# Patient Record
Sex: Male | Born: 1945
Health system: Southern US, Community
[De-identification: ages and names within clinical notes are randomized; demographics above are authoritative.]

## PROBLEM LIST (undated history)

## (undated) ENCOUNTER — Emergency Department: Admission: EM

## (undated) DIAGNOSIS — G5602 Carpal tunnel syndrome, left upper limb: Secondary | ICD-10-CM

## (undated) DIAGNOSIS — I639 Cerebral infarction, unspecified: Secondary | ICD-10-CM

## (undated) DIAGNOSIS — C801 Malignant (primary) neoplasm, unspecified: Secondary | ICD-10-CM

## (undated) DIAGNOSIS — D631 Anemia in chronic kidney disease: Secondary | ICD-10-CM

## (undated) DIAGNOSIS — M5126 Other intervertebral disc displacement, lumbar region: Secondary | ICD-10-CM

## (undated) DIAGNOSIS — I2699 Other pulmonary embolism without acute cor pulmonale: Secondary | ICD-10-CM

## (undated) DIAGNOSIS — N400 Enlarged prostate without lower urinary tract symptoms: Secondary | ICD-10-CM

## (undated) DIAGNOSIS — R001 Bradycardia, unspecified: Secondary | ICD-10-CM

## (undated) DIAGNOSIS — G4730 Sleep apnea, unspecified: Secondary | ICD-10-CM

## (undated) DIAGNOSIS — S83209A Unspecified tear of unspecified meniscus, current injury, unspecified knee, initial encounter: Secondary | ICD-10-CM

## (undated) DIAGNOSIS — Z8546 Personal history of malignant neoplasm of prostate: Secondary | ICD-10-CM

## (undated) DIAGNOSIS — K589 Irritable bowel syndrome without diarrhea: Secondary | ICD-10-CM

## (undated) DIAGNOSIS — M1710 Unilateral primary osteoarthritis, unspecified knee: Secondary | ICD-10-CM

## (undated) DIAGNOSIS — M179 Osteoarthritis of knee, unspecified: Secondary | ICD-10-CM

## (undated) DIAGNOSIS — R918 Other nonspecific abnormal finding of lung field: Secondary | ICD-10-CM

## (undated) DIAGNOSIS — N2889 Other specified disorders of kidney and ureter: Secondary | ICD-10-CM

## (undated) DIAGNOSIS — K5792 Diverticulitis of intestine, part unspecified, without perforation or abscess without bleeding: Secondary | ICD-10-CM

## (undated) DIAGNOSIS — E785 Hyperlipidemia, unspecified: Secondary | ICD-10-CM

## (undated) DIAGNOSIS — N182 Chronic kidney disease, stage 2 (mild): Secondary | ICD-10-CM

## (undated) DIAGNOSIS — N529 Male erectile dysfunction, unspecified: Secondary | ICD-10-CM

## (undated) DIAGNOSIS — I10 Essential (primary) hypertension: Secondary | ICD-10-CM

## (undated) HISTORY — DX: Other nonspecific abnormal finding of lung field: R91.8

## (undated) HISTORY — DX: Essential (primary) hypertension: I10

## (undated) HISTORY — PX: KNEE ARTHROSCOPY: SUR90

## (undated) HISTORY — DX: Malignant (primary) neoplasm, unspecified: C80.1

## (undated) HISTORY — PX: PROSTATECTOMY: SHX69

## (undated) HISTORY — DX: Hyperlipidemia, unspecified: E78.5

## (undated) HISTORY — PX: LUMBAR LAMINECTOMY: SHX95

## (undated) HISTORY — DX: Bradycardia, unspecified: R00.1

## (undated) HISTORY — DX: Male erectile dysfunction, unspecified: N52.9

## (undated) HISTORY — DX: Other specified disorders of kidney and ureter: N28.89

## (undated) HISTORY — DX: Personal history of malignant neoplasm of prostate: Z85.46

## (undated) HISTORY — DX: Unilateral primary osteoarthritis, unspecified knee: M17.10

## (undated) HISTORY — DX: Irritable bowel syndrome, unspecified: K58.9

## (undated) HISTORY — DX: Sleep apnea, unspecified: G47.30

## (undated) HISTORY — DX: Diverticulitis of intestine, part unspecified, without perforation or abscess without bleeding: K57.92

## (undated) HISTORY — DX: Osteoarthritis of knee, unspecified: M17.9

## (undated) HISTORY — DX: Chronic kidney disease, stage 2 (mild): N18.2

## (undated) HISTORY — DX: Carpal tunnel syndrome, left upper limb: G56.02

## (undated) HISTORY — DX: Unspecified tear of unspecified meniscus, current injury, unspecified knee, initial encounter: S83.209A

## (undated) HISTORY — DX: Other intervertebral disc displacement, lumbar region: M51.26

## (undated) HISTORY — DX: Benign prostatic hyperplasia without lower urinary tract symptoms: N40.0

## (undated) HISTORY — PX: COLONOSCOPY: SHX174

---

## 2008-09-13 ENCOUNTER — Ambulatory Visit: Payer: Self-pay | Admitting: General Practice

## 2009-02-28 ENCOUNTER — Ambulatory Visit: Payer: Self-pay | Admitting: General Practice

## 2009-11-14 ENCOUNTER — Ambulatory Visit: Payer: Self-pay | Admitting: General Practice

## 2011-01-01 ENCOUNTER — Ambulatory Visit: Payer: Self-pay | Admitting: General Practice

## 2012-03-13 ENCOUNTER — Emergency Department: Payer: Self-pay | Admitting: Emergency Medicine

## 2012-05-28 ENCOUNTER — Ambulatory Visit: Payer: Self-pay | Admitting: Family Medicine

## 2014-05-19 HISTORY — PX: OTHER SURGICAL HISTORY: SHX169

## 2014-05-27 ENCOUNTER — Ambulatory Visit: Payer: Self-pay | Admitting: Family Medicine

## 2014-08-25 ENCOUNTER — Emergency Department: Payer: Self-pay | Admitting: Emergency Medicine

## 2014-08-25 LAB — URINALYSIS, COMPLETE
BACTERIA: NONE SEEN
BILIRUBIN, UR: NEGATIVE
BLOOD: NEGATIVE
GLUCOSE, UR: NEGATIVE mg/dL (ref 0–75)
Ketone: NEGATIVE
Leukocyte Esterase: NEGATIVE
Nitrite: NEGATIVE
Ph: 6 (ref 4.5–8.0)
Protein: NEGATIVE
RBC, UR: NONE SEEN /HPF (ref 0–5)
Specific Gravity: 1.017 (ref 1.003–1.030)

## 2014-08-25 LAB — COMPREHENSIVE METABOLIC PANEL
ANION GAP: 6 — AB (ref 7–16)
Albumin: 4.1 g/dL (ref 3.4–5.0)
Alkaline Phosphatase: 73 U/L
BUN: 13 mg/dL (ref 7–18)
Bilirubin,Total: 0.3 mg/dL (ref 0.2–1.0)
CHLORIDE: 100 mmol/L (ref 98–107)
CREATININE: 1.3 mg/dL (ref 0.60–1.30)
Calcium, Total: 9.1 mg/dL (ref 8.5–10.1)
Co2: 27 mmol/L (ref 21–32)
EGFR (Non-African Amer.): 58 — ABNORMAL LOW
GLUCOSE: 95 mg/dL (ref 65–99)
Osmolality: 266 (ref 275–301)
POTASSIUM: 4.1 mmol/L (ref 3.5–5.1)
SGOT(AST): 19 U/L (ref 15–37)
SGPT (ALT): 20 U/L
SODIUM: 133 mmol/L — AB (ref 136–145)
Total Protein: 7.7 g/dL (ref 6.4–8.2)

## 2014-08-25 LAB — CBC WITH DIFFERENTIAL/PLATELET
Basophil #: 0.1 10*3/uL (ref 0.0–0.1)
Basophil %: 1.3 %
Eosinophil #: 0.1 10*3/uL (ref 0.0–0.7)
Eosinophil %: 2.5 %
HCT: 42.5 % (ref 40.0–52.0)
HGB: 13.8 g/dL (ref 13.0–18.0)
LYMPHS PCT: 42.1 %
Lymphocyte #: 2.2 10*3/uL (ref 1.0–3.6)
MCH: 27.6 pg (ref 26.0–34.0)
MCHC: 32.5 g/dL (ref 32.0–36.0)
MCV: 85 fL (ref 80–100)
MONOS PCT: 9.2 %
Monocyte #: 0.5 x10 3/mm (ref 0.2–1.0)
NEUTROS ABS: 2.3 10*3/uL (ref 1.4–6.5)
Neutrophil %: 44.9 %
PLATELETS: 284 10*3/uL (ref 150–440)
RBC: 4.99 10*6/uL (ref 4.40–5.90)
RDW: 13.1 % (ref 11.5–14.5)
WBC: 5.2 10*3/uL (ref 3.8–10.6)

## 2014-08-25 LAB — LIPASE, BLOOD: Lipase: 87 U/L (ref 73–393)

## 2015-08-20 DIAGNOSIS — K2970 Gastritis, unspecified, without bleeding: Secondary | ICD-10-CM

## 2015-08-20 HISTORY — DX: Gastritis, unspecified, without bleeding: K29.70

## 2015-10-17 DIAGNOSIS — N182 Chronic kidney disease, stage 2 (mild): Secondary | ICD-10-CM | POA: Insufficient documentation

## 2015-10-17 DIAGNOSIS — E785 Hyperlipidemia, unspecified: Secondary | ICD-10-CM | POA: Insufficient documentation

## 2015-10-17 DIAGNOSIS — N1831 Chronic kidney disease, stage 3a: Secondary | ICD-10-CM | POA: Insufficient documentation

## 2015-10-17 DIAGNOSIS — Z8546 Personal history of malignant neoplasm of prostate: Secondary | ICD-10-CM | POA: Insufficient documentation

## 2015-10-17 DIAGNOSIS — R918 Other nonspecific abnormal finding of lung field: Secondary | ICD-10-CM | POA: Insufficient documentation

## 2015-10-17 DIAGNOSIS — I10 Essential (primary) hypertension: Secondary | ICD-10-CM | POA: Insufficient documentation

## 2015-10-17 DIAGNOSIS — N183 Chronic kidney disease, stage 3 unspecified: Secondary | ICD-10-CM | POA: Insufficient documentation

## 2015-10-17 DIAGNOSIS — G5602 Carpal tunnel syndrome, left upper limb: Secondary | ICD-10-CM | POA: Insufficient documentation

## 2015-10-19 ENCOUNTER — Ambulatory Visit (INDEPENDENT_AMBULATORY_CARE_PROVIDER_SITE_OTHER): Payer: Medicare Other | Admitting: Family Medicine

## 2015-10-19 ENCOUNTER — Encounter: Payer: Self-pay | Admitting: Family Medicine

## 2015-10-19 VITALS — BP 149/68 | HR 79 | Temp 97.2°F | Ht 71.5 in | Wt 229.0 lb

## 2015-10-19 DIAGNOSIS — N2889 Other specified disorders of kidney and ureter: Secondary | ICD-10-CM

## 2015-10-19 DIAGNOSIS — G5602 Carpal tunnel syndrome, left upper limb: Secondary | ICD-10-CM | POA: Diagnosis not present

## 2015-10-19 DIAGNOSIS — I700 Atherosclerosis of aorta: Secondary | ICD-10-CM

## 2015-10-19 DIAGNOSIS — I10 Essential (primary) hypertension: Secondary | ICD-10-CM

## 2015-10-19 DIAGNOSIS — E785 Hyperlipidemia, unspecified: Secondary | ICD-10-CM

## 2015-10-19 DIAGNOSIS — N182 Chronic kidney disease, stage 2 (mild): Secondary | ICD-10-CM | POA: Diagnosis not present

## 2015-10-19 DIAGNOSIS — R918 Other nonspecific abnormal finding of lung field: Secondary | ICD-10-CM | POA: Diagnosis not present

## 2015-10-19 DIAGNOSIS — Z8546 Personal history of malignant neoplasm of prostate: Secondary | ICD-10-CM

## 2015-10-19 DIAGNOSIS — R1084 Generalized abdominal pain: Secondary | ICD-10-CM | POA: Diagnosis not present

## 2015-10-19 DIAGNOSIS — N281 Cyst of kidney, acquired: Secondary | ICD-10-CM | POA: Insufficient documentation

## 2015-10-19 LAB — UA/M W/RFLX CULTURE, ROUTINE
BILIRUBIN UA: NEGATIVE
GLUCOSE, UA: NEGATIVE
Ketones, UA: NEGATIVE
Leukocytes, UA: NEGATIVE
Nitrite, UA: NEGATIVE
PROTEIN UA: NEGATIVE
RBC UA: NEGATIVE
SPEC GRAV UA: 1.02 (ref 1.005–1.030)
Urobilinogen, Ur: 0.2 mg/dL (ref 0.2–1.0)
pH, UA: 6.5 (ref 5.0–7.5)

## 2015-10-19 MED ORDER — DICYCLOMINE HCL 10 MG PO CAPS: 10.0000 mg | ORAL_CAPSULE | Freq: Three times a day (TID) | ORAL | Status: DC

## 2015-10-19 NOTE — Progress Notes (Signed)
BP 149/68 mmHg  Pulse 79  Temp(Src) 97.2 F (36.2 C)  Ht 5' 11.5" (1.816 m)  Wt 229 lb (103.874 kg)  BMI 31.50 kg/m2  SpO2 100%   Subjective:    Patient ID: Jared Cline, male    DOB: 07-Jul-1946, 70 y.o.   MRN: [id number]  HPI: Jared Cline is a 70 y.o. male  Chief Complaint  Patient presents with  . Irritable Bowel Syndrome    he went to the ER about a year ago with same symptoms, was told he had IBS. It is flaring up again.   He is having IBS; same as last year; this round started up 3 weeks ago; whatever he eats, cramps; it can be anything; just comes on once a year; started back in the 1990's He went to the ER for evaluation and they told him it was IBS back in Jan 2016; notes reviewed Having pain again Constipated the day he went to the ER last year Aortic atherosclerosis noted on scan; was put on statin and had memory loss with the statin (either Lipitor or Crestor), did not tolerate it  Reviewed CT scan from Aug 25, 2014 in detail with patient here in the office: * PRIOR REPORT IMPORTED FROM AN EXTERNAL SYSTEM *  CLINICAL DATA: Complains of "severe abdominal pain" since Friday.  Pain bilateral to lower quadrants, constant and sharp in nature.  Denies n/v/d. Denies urinary symptoms. Lower back pain. HX: prostate  surgery due to CA.   EXAM:  CT ABDOMEN AND PELVIS WITH CONTRAST   TECHNIQUE:  Multidetector CT imaging of the abdomen and pelvis was performed  using the standard protocol following bolus administration of  intravenous contrast.   CONTRAST: 100 ml Omnipaque 300   COMPARISON: 01/01/2011   FINDINGS:  There is a 5 mm left lower lobe pulmonary nodule. There are two 5 mm  lingular pulmonary nodules.   The liver demonstrates no focal abnormality. There is no  intrahepatic or extrahepatic biliary ductal dilatation. The  gallbladder is normal. The spleen demonstrates no focal abnormality.  There is a 9 mm right renal mass which is  similar in size to the  prior exam of 01/01/2011. There are bilateral renal lobulation is.  There is right renal scarring. The adrenal glands and pancreas are  normal. The bladder is unremarkable. There is evidence of prior  prostatectomy.   The stomach, duodenum, small intestine, and large intestine  demonstrate no contrast extravasation or dilatation. There is a  normal caliber appendix in the right lower quadrant without  periappendiceal inflammatory changes.There is a moderate amount of  stool in the ascending and transverse colon. There is no  pneumoperitoneum, pneumatosis, or portal venous gas. There is no  abdominal or pelvic free fluid. There is no lymphadenopathy.   The abdominal aorta is normal in caliber with atherosclerosis.   There are no lytic or sclerotic osseous lesions. There is  degenerative disc disease at L3-4. There is bilateral facet  arthropathy at L3-4, L4-5 and L5-S1. There is mild osteoarthritis of  bilateral hips. There are degenerative changes of bilateral SI  joints.   IMPRESSION:  1. No acute abdominal or pelvic pathology.  2. Nonspecific pulmonary nodules. There is a 5 mm left lower lobe  pulmonary nodule. There are two 5 mm lingular pulmonary nodules.  3. Moderate amount of stool in the ascending and transverse colon.  4. There is a 9 mm right renal mass which is similar in size to the  prior exam  of 01/01/2011. This may reflect a small renal cell  carcinoma versus complex cyst, but is incompletely characterized on  this exam. Urology follow-up recommended.    Electronically Signed   By: Kathreen Devoid   On: 08/25/2014 12:42   Relevant past medical, surgical, family and social history reviewed and updated as indicated Past Medical History  Diagnosis Date  . Hyperlipidemia   . Hypertension   . CKD (chronic kidney disease) stage 2, GFR 60-89 ml/min   . Carpal tunnel syndrome, left     left arm  .  Bradycardia     evaluated by Dr. Nehemiah Massed  . History of prostate cancer   . BPH (benign prostatic hypertrophy)   . Impotence   . GERD (gastroesophageal reflux disease)   . OA (osteoarthritis) of knee     right  . Torn meniscus     x 2; right knee  . HNP (herniated nucleus pulposus), lumbar   . Diverticulitis   . Renal mass     evaluated; followed by urologist  . Multiple lung nodules     nonspecific, largest 75mm in Jan 2016; rescan July 2016/Jan 2017   Past Surgical History  Procedure Laterality Date  . Knee arthroscopy Right     x 2  . Prostatectomy      robotic  . Lumbar laminectomy    . Abdominal ultrasound  Oct 2015    Negative for eval for AAA   Family History  Problem Relation Age of Onset  . Stroke Mother   . Hypertension Mother   . Aneurysm Father     cerebral  . Hypertension Father   . Heart disease Paternal Uncle   . Cancer Neg Hx   . COPD Neg Hx   . Diabetes Neg Hx   paternal uncles, father died from heart attacks / strokes  Social History  Substance Use Topics  . Smoking status: Former Smoker -- 0.50 packs/day for 10 years    Types: Cigarettes    Quit date: 08/20/1971  . Smokeless tobacco: Never Used  . Alcohol Use: No   Started smoking at age 19-18, stopped at 66 Interim medical history since our last visit reviewed. Allergies and medications reviewed and updated.  Review of Systems No night sweats; no unexplained; no blood in the urine; no blood in the stool  Per HPI unless specifically indicated above     Objective:    BP 149/68 mmHg  Pulse 79  Temp(Src) 97.2 F (36.2 C)  Ht 5' 11.5" (1.816 m)  Wt 229 lb (103.874 kg)  BMI 31.50 kg/m2  SpO2 100%  Wt Readings from Last 3 Encounters:  10/19/15 229 lb (103.874 kg)  11/25/14 229 lb (103.874 kg)    Physical Exam  Constitutional: He appears well-developed and well-nourished. No distress.  Weight stable  HENT:  Head: Normocephalic and atraumatic.  Eyes: EOM are normal. No scleral  icterus.  Neck: No thyromegaly present.  Cardiovascular: Normal rate and regular rhythm.   Pulmonary/Chest: Effort normal and breath sounds normal.  Abdominal: Soft. Normal appearance and bowel sounds are normal. He exhibits no distension. There is no hepatosplenomegaly. There is no tenderness. No hernia.  Musculoskeletal: He exhibits no edema.  Neurological: Coordination normal.  Skin: Skin is warm and dry. No pallor.  Psychiatric: He has a normal mood and affect. His behavior is normal. Judgment and thought content normal.      Assessment & Plan:   Problem List Items Addressed This Visit  Cardiovascular and Mediastinum   Hypertension    Controlled today      Aortic atherosclerosis ([place])    Noted incidentally on abdominal CT scan last year done in the ER; reviewed with patient in detail today; will check lipid panel; he did not tolerate statin        Nervous and Auditory   Carpal tunnel syndrome, left    Stable, uses brace        Genitourinary   CKD (chronic kidney disease) stage 2, GFR 60-89 ml/min    Check renal function        Other   Hyperlipidemia    Check today      Relevant Orders   Lipid Panel w/o Chol/HDL Ratio (Completed)   History of prostate cancer    Send back to Dr. Yves Dill; check PSA today      Relevant Orders   PSA (Completed)   Ambulatory referral to Urology   Multiple lung nodules    Order lung scan today      Relevant Orders   CT Chest W Contrast   Right renal mass    Refer to urologist; urine today      Relevant Orders   CT Abdomen Pelvis W Contrast   Ambulatory referral to Urology    Other Visit Diagnoses    Abdominal pain, generalized    -  Primary    Relevant Orders    CBC with Differential/Platelet (Completed)    Comprehensive metabolic panel (Completed)    UA/M w/rflx Culture, Routine (Completed)    Ambulatory referral to Gastroenterology       Follow up plan: No Follow-up on file.  Will depend on studies,  specialist evaluations  An after-visit summary was printed and given to the patient at [place].  Please see the patient instructions which may contain other information and recommendations beyond what is mentioned above in the assessment and plan.  Orders Placed This Encounter  Procedures  . CT Abdomen Pelvis W Contrast  . CT Chest W Contrast  . Lipid Panel w/o Chol/HDL Ratio  . CBC with Differential/Platelet  . Comprehensive metabolic panel  . UA/M w/rflx Culture, Routine  . PSA  . Ambulatory referral to Urology  . Ambulatory referral to Gastroenterology   Meds ordered this encounter  Medications  . Potassium Gluconate 550 MG TABS    Sig: Take 550 mg by mouth daily.  [name] dicyclomine (BENTYL) 10 MG capsule    Sig: Take 1 capsule (10 mg total) by mouth 4 (four) times daily -  before meals and at bedtime. As needed for irritable bowel symptoms    Dispense:  40 capsule    Refill:  0

## 2015-10-19 NOTE — Assessment & Plan Note (Addendum)
Refer to urologist; urine today

## 2015-10-19 NOTE — Assessment & Plan Note (Signed)
Check renal function. 

## 2015-10-19 NOTE — Assessment & Plan Note (Signed)
Check today 

## 2015-10-19 NOTE — Assessment & Plan Note (Signed)
Controlled today 

## 2015-10-19 NOTE — Patient Instructions (Addendum)
If you have not heard anything from my staff by Monday afternoon about any orders/referrals/studies from today, please contact us here to follow-up [phone] If your pain worsens or you have worrisome signs like blood in your urine or stool, please go to urgent care or the ER over the weekend Try the low FODMAP diet Start the [place] -- I am fine to accept patient's wife as a patient, so okay to put her on my schedule as a new patient

## 2015-10-19 NOTE — Assessment & Plan Note (Signed)
Order lung scan today

## 2015-10-19 NOTE — Assessment & Plan Note (Signed)
Stable, uses brace

## 2015-10-19 NOTE — Assessment & Plan Note (Signed)
Send back to Dr. Yves Dill; check PSA today

## 2015-10-20 ENCOUNTER — Telehealth: Payer: Self-pay | Admitting: Family Medicine

## 2015-10-20 LAB — COMPREHENSIVE METABOLIC PANEL
ALBUMIN: 4.3 g/dL (ref 3.5–4.8)
ALK PHOS: 75 IU/L (ref 39–117)
ALT: 11 IU/L (ref 0–44)
AST: 17 IU/L (ref 0–40)
Albumin/Globulin Ratio: 1.7 (ref 1.1–2.5)
BUN / CREAT RATIO: 11 (ref 10–22)
BUN: 12 mg/dL (ref 8–27)
Bilirubin Total: <0.2 mg/dL (ref 0.0–1.2)
CO2: 25 mmol/L (ref 18–29)
Calcium: 9.4 mg/dL (ref 8.6–10.2)
Chloride: 98 mmol/L (ref 96–106)
Creatinine, Ser: 1.1 mg/dL (ref 0.76–1.27)
GFR, EST AFRICAN AMERICAN: 78 mL/min/{1.73_m2} (ref >59)
GFR, EST NON AFRICAN AMERICAN: 67 mL/min/{1.73_m2} (ref >59)
GLOBULIN, TOTAL: 2.6 g/dL (ref 1.5–4.5)
Glucose: 118 mg/dL — ABNORMAL HIGH (ref 65–99)
Potassium: 4.2 mmol/L (ref 3.5–5.2)
SODIUM: 135 mmol/L (ref 134–144)
TOTAL PROTEIN: 6.9 g/dL (ref 6.0–8.5)

## 2015-10-20 LAB — CBC WITH DIFFERENTIAL/PLATELET
Basophils Absolute: 0.1 10*3/uL (ref 0.0–0.2)
Basos: 1 %
EOS (ABSOLUTE): 0.1 10*3/uL (ref 0.0–0.4)
EOS: 2 %
HEMATOCRIT: 37.6 % (ref 37.5–51.0)
HEMOGLOBIN: 12.3 g/dL — AB (ref 12.6–17.7)
IMMATURE GRANS (ABS): 0 10*3/uL (ref 0.0–0.1)
Immature Granulocytes: 0 %
LYMPHS ABS: 2.3 10*3/uL (ref 0.7–3.1)
LYMPHS: 48 %
MCH: 27 pg (ref 26.6–33.0)
MCHC: 32.7 g/dL (ref 31.5–35.7)
MCV: 83 fL (ref 79–97)
MONOCYTES: 10 %
Monocytes Absolute: 0.5 10*3/uL (ref 0.1–0.9)
NEUTROS ABS: 1.9 10*3/uL (ref 1.4–7.0)
Neutrophils: 39 %
Platelets: 343 10*3/uL (ref 150–379)
RBC: 4.55 x10E6/uL (ref 4.14–5.80)
RDW: 13.1 % (ref 12.3–15.4)
WBC: 4.9 10*3/uL (ref 3.4–10.8)

## 2015-10-20 LAB — LIPID PANEL W/O CHOL/HDL RATIO
CHOLESTEROL TOTAL: 174 mg/dL (ref 100–199)
HDL: 48 mg/dL (ref >39)
LDL Calculated: 112 mg/dL — ABNORMAL HIGH (ref 0–99)
TRIGLYCERIDES: 71 mg/dL (ref 0–149)
VLDL Cholesterol Cal: 14 mg/dL (ref 5–40)

## 2015-10-20 LAB — PSA: Prostate Specific Ag, Serum: <0.1 ng/mL (ref 0.0–4.0)

## 2015-10-20 NOTE — Telephone Encounter (Signed)
I talked with patient; PSA undetectable as it should be; Hgb down just 3 tenths of a point; he is going for colonoscopy; had recent GI upset, will see if something there caused some loss of blood; kidneys working a little better now than last year; I'll check LDL with 10 year ASCVD calculator to see if med needed He requested copy of labs be mailed when I offered

## 2015-10-21 MED ORDER — ATORVASTATIN CALCIUM 20 MG PO TABS: 20.0000 mg | ORAL_TABLET | Freq: Every day | ORAL | Status: DC

## 2015-10-21 NOTE — Telephone Encounter (Signed)
Please let patient know that I did the risk calculation for heart disease and a statin is indeed recommended for him; I sent a prescription to the pharmacy and I'd like to have him recheck his cholesterol (fasting) in about 8 weeks Try to limit saturated fats (bacon, sausage, hamburgers, cheese, etc) and try to lose 5-10 pounds over the next few months Please send copy of lab results to patient

## 2015-10-23 ENCOUNTER — Ambulatory Visit: Payer: Self-pay | Admitting: Gastroenterology

## 2015-10-23 NOTE — Telephone Encounter (Signed)
Left message to call.

## 2015-10-23 NOTE — Telephone Encounter (Signed)
Patient notified. Copy of labs and a cholesterol diet handout mailed to patient.

## 2015-10-25 DIAGNOSIS — I70219 Atherosclerosis of native arteries of extremities with intermittent claudication, unspecified extremity: Secondary | ICD-10-CM | POA: Insufficient documentation

## 2015-10-25 DIAGNOSIS — I700 Atherosclerosis of aorta: Secondary | ICD-10-CM | POA: Insufficient documentation

## 2015-10-25 NOTE — Assessment & Plan Note (Signed)
Noted incidentally on abdominal CT scan last year done in the ER; reviewed with patient in detail today; will check lipid panel; he did not tolerate statin

## 2015-10-26 DIAGNOSIS — D400 Neoplasm of uncertain behavior of prostate: Secondary | ICD-10-CM | POA: Diagnosis not present

## 2015-10-26 DIAGNOSIS — N281 Cyst of kidney, acquired: Secondary | ICD-10-CM | POA: Diagnosis not present

## 2015-10-27 ENCOUNTER — Telehealth: Payer: Self-pay

## 2015-10-27 NOTE — Telephone Encounter (Signed)
Nevermind Dr. Sanda Klein. Nursing office from [facility] provider portal from [facility] called me and I got it approved.  Authorization # [id number]

## 2015-10-27 NOTE — Telephone Encounter (Signed)
Patients approval for CT Scan Chest and CT Scan Abdomen/Pelvis needs peer to peer review with [employer].  You can call the directed line for the peer to peer at [phone].

## 2015-11-03 ENCOUNTER — Ambulatory Visit
Admission: RE | Admit: 2015-11-03 | Discharge: 2015-11-03 | Disposition: A | Discharge status: Home / Self Care | Payer: Medicare Other | Source: Ambulatory Visit | Attending: Family Medicine | Admitting: Family Medicine

## 2015-11-03 DIAGNOSIS — N289 Disorder of kidney and ureter, unspecified: Secondary | ICD-10-CM | POA: Diagnosis not present

## 2015-11-03 DIAGNOSIS — R918 Other nonspecific abnormal finding of lung field: Secondary | ICD-10-CM | POA: Diagnosis not present

## 2015-11-03 DIAGNOSIS — N2889 Other specified disorders of kidney and ureter: Secondary | ICD-10-CM | POA: Insufficient documentation

## 2015-11-03 MED ORDER — IOHEXOL 300 MG/ML  SOLN
100.0000 mL | Freq: Once | INTRAMUSCULAR | Status: AC | PRN
  Administered 2015-11-03: 100 mL via INTRAVENOUS

## 2015-11-06 ENCOUNTER — Telehealth: Payer: Self-pay | Admitting: Family Medicine

## 2015-11-06 DIAGNOSIS — R918 Other nonspecific abnormal finding of lung field: Secondary | ICD-10-CM

## 2015-11-06 NOTE — Telephone Encounter (Signed)
Patient notified of results. He saw [facility] (urology) last week. I'll fax a copy of results to his office.

## 2015-11-06 NOTE — Telephone Encounter (Signed)
Please let the patient know I am out today; my apologies; he has a growth on his right kidney, so we'll have him see the urologist; please check on the referral from March 2nd because we'll want him to see kidney doctor as soon as possible The nodules in his chest appear stable, but we'll want to follow that with another CT scan in 3-6 months; there are no enlargements of any lymph nodes, so that's good news

## 2015-11-06 NOTE — Telephone Encounter (Signed)
Left message to call.

## 2015-11-21 ENCOUNTER — Telehealth: Payer: Self-pay

## 2015-11-21 DIAGNOSIS — Z9989 Dependence on other enabling machines and devices: Secondary | ICD-10-CM

## 2015-11-21 DIAGNOSIS — G4733 Obstructive sleep apnea (adult) (pediatric): Secondary | ICD-10-CM

## 2015-11-21 MED ORDER — HYDROCHLOROTHIAZIDE 25 MG PO TABS: 25.0000 mg | ORAL_TABLET | Freq: Every day | ORAL | Status: DC

## 2015-11-21 MED ORDER — CLONIDINE HCL 0.3 MG PO TABS: 0.3000 mg | ORAL_TABLET | Freq: Two times a day (BID) | ORAL | Status: DC

## 2015-11-21 MED ORDER — HYDRALAZINE HCL 25 MG PO TABS: 25.0000 mg | ORAL_TABLET | Freq: Two times a day (BID) | ORAL | Status: DC

## 2015-11-21 MED ORDER — ATORVASTATIN CALCIUM 20 MG PO TABS: 20.0000 mg | ORAL_TABLET | Freq: Every day | ORAL | Status: DC

## 2015-11-21 MED ORDER — DICYCLOMINE HCL 10 MG PO CAPS: 10.0000 mg | ORAL_CAPSULE | Freq: Three times a day (TID) | ORAL | Status: DC

## 2015-11-21 NOTE — Telephone Encounter (Signed)
I sent in the Rxs as requested We just need to know where to send the CPAP order, and if you can find the settings that would be helpful; verify if we do his CPAP or if maybe pulmonologist or ENT, so we don't duplicate work; thanks
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
## 2015-11-22 DIAGNOSIS — Z9989 Dependence on other enabling machines and devices: Secondary | ICD-10-CM | POA: Insufficient documentation

## 2015-11-22 DIAGNOSIS — G4733 Obstructive sleep apnea (adult) (pediatric): Secondary | ICD-10-CM | POA: Insufficient documentation

## 2015-11-22 NOTE — Telephone Encounter (Signed)
Address: [address], [place], [place] [address]  Phone: [phone] I called [facility] to get settings and ask them to send me a form to sign

## 2015-11-22 NOTE — Telephone Encounter (Signed)
Patient states he cannot remember it's been so long, but uses [facility] and pressure was set at 1?

## 2015-11-22 NOTE — Telephone Encounter (Signed)
I called, explained he will need everything They have done his supplies I spoke with Sonia Baller; she will contact the patient; get a loaner out and work with insurance to re-order new equipment

## 2015-11-23 DIAGNOSIS — G4733 Obstructive sleep apnea (adult) (pediatric): Secondary | ICD-10-CM | POA: Diagnosis not present

## 2015-12-15 ENCOUNTER — Encounter: Payer: Self-pay | Admitting: Gastroenterology

## 2015-12-15 ENCOUNTER — Ambulatory Visit (INDEPENDENT_AMBULATORY_CARE_PROVIDER_SITE_OTHER): Payer: Medicare Other | Admitting: Gastroenterology

## 2015-12-15 VITALS — BP 150/80 | HR 72 | Ht 72.0 in | Wt 222.2 lb

## 2015-12-15 DIAGNOSIS — R1033 Periumbilical pain: Secondary | ICD-10-CM | POA: Diagnosis not present

## 2015-12-15 MED ORDER — NA SULFATE-K SULFATE-MG SULF 17.5-3.13-1.6 GM/177ML PO SOLN: 1.0000 | Freq: Once | ORAL | Status: DC

## 2015-12-15 NOTE — Patient Instructions (Signed)
You will be set up for a colonoscopy. You will be set up for an upper endoscopy. Both for abdominal pains

## 2015-12-15 NOTE — Progress Notes (Signed)
HPI: This is a   very pleasant 70 year old man    who was referred to me by Arnetha Courser, MD  to evaluate  abdominal pain .    Chief complaint is abdominal pain  Abdominal pains, since January.  The pain is located periumbilically.  It is a Twisting sensation, also burning. The pain is daily, intermittent.  Can last 1 hour.  Eating often causes it.   Yogurt helps it.  BMs regular, no bleeding.  NO problems.  He had a colonoscopy MANY years ago.  Never EGD.  Overall his weight down 8-10 pounds, intentionally.  Takes tylenol daily for left shoulder pain.  Years ago, same type of pains, was told it was IBS.   Labs 10/2015: cbc, cmet normal.  CT scan 10/2015:  1. No acute abdominal or pelvic pathology.  2. Nonspecific pulmonary nodules. There is a 5 mm left lower lobe  pulmonary nodule. There are two 5 mm lingular pulmonary nodules.  3. Moderate amount of stool in the ascending and transverse colon.  4. There is a 9 mm right renal mass which is similar in size to the  prior exam of 01/01/2011. This may reflect a small renal cell  carcinoma versus complex cyst, but is incompletely characterized on  this exam. Urology follow-up recommended.   Review of systems: Pertinent positive and negative review of systems were noted in the above HPI section. Complete review of systems was performed and was otherwise normal.   Past Medical History  Diagnosis Date  . Hyperlipidemia   . Hypertension   . CKD (chronic kidney disease) stage 2, GFR 60-89 ml/min   . Carpal tunnel syndrome, left     left arm  . Bradycardia     evaluated by Dr. Nehemiah Massed  . History of prostate cancer   . BPH (benign prostatic hypertrophy)   . Impotence   . GERD (gastroesophageal reflux disease)   . OA (osteoarthritis) of knee     right  . Torn meniscus     x 2; right knee  . HNP (herniated nucleus pulposus), lumbar   . Diverticulitis   . Renal mass     evaluated; followed by urologist  .  Multiple lung nodules     nonspecific, largest 28mm in Jan 2016; rescan July 2016/Jan 2017  . IBS (irritable bowel syndrome)   . Sleep apnea     on CPAP    Past Surgical History  Procedure Laterality Date  . Knee arthroscopy Right     x 2  . Prostatectomy      robotic  . Lumbar laminectomy    . Abdominal ultrasound  Oct 2015    Negative for eval for AAA  . Colonoscopy      Current Outpatient Prescriptions  Medication Sig Dispense Refill  . aspirin EC 81 MG tablet Take 81 mg by mouth daily.    . cloNIDine (CATAPRES) 0.3 MG tablet Take 1 tablet (0.3 mg total) by mouth 2 (two) times daily. 60 tablet 5  . dicyclomine (BENTYL) 10 MG capsule Take 1 capsule (10 mg total) by mouth 4 (four) times daily -  before meals and at bedtime. As needed for irritable bowel symptoms 40 capsule 1  . hydrALAZINE (APRESOLINE) 25 MG tablet Take 1 tablet (25 mg total) by mouth 2 (two) times daily. 60 tablet 5  . hydrochlorothiazide (HYDRODIURIL) 25 MG tablet Take 1 tablet (25 mg total) by mouth daily. 30 tablet 2  . magnesium oxide (MAG-OX) 400 MG  tablet Take 400 mg by mouth daily.    . Potassium Gluconate 550 MG TABS Take 550 mg by mouth daily.     No current facility-administered medications for this visit.    Allergies as of 12/15/2015 - Review Complete 12/15/2015  Allergen Reaction Noted  . Flomax [tamsulosin hcl] Shortness Of Breath 10/17/2015  . Ace inhibitors Other (See Comments) 10/17/2015  . Amlodipine Other (See Comments) 10/17/2015  . Doxazosin  10/17/2015  . Lipitor [atorvastatin] Other (See Comments) 12/15/2015    Family History  Problem Relation Age of Onset  . Stroke Mother   . Hypertension Mother   . Aneurysm Father     cerebral  . Hypertension Father   . Heart disease Paternal Uncle   . Cancer Neg Hx   . COPD Neg Hx   . Diabetes Neg Hx     Social History   Social History  . Marital Status: Married    Spouse Name: N/A  . Number of Children: 3  . Years of Education:  N/A   Occupational History  . retired city of [place] History Main Topics  . Smoking status: Former Smoker -- 0.50 packs/day for 10 years    Types: Cigarettes    Quit date: 08/20/1971  . Smokeless tobacco: Never Used  . Alcohol Use: No  . Drug Use: No  . Sexual Activity: Not on file   Other Topics Concern  . Not on file   Social History Narrative     Physical Exam: BP 150/80 mmHg  Pulse 72  Ht 6' (1.829 m)  Wt 222 lb 3.2 oz (100.789 kg)  BMI 30.13 kg/m2 Constitutional: generally well-appearing Psychiatric: alert and oriented x3 Eyes: extraocular movements intact Mouth: oral pharynx moist, no lesions Neck: supple no lymphadenopathy Cardiovascular: heart regular rate and rhythm Lungs: clear to auscultation bilaterally Abdomen: soft, nontender, nondistended, no obvious ascites, no peritoneal signs, normal bowel sounds Extremities: no lower extremity edema bilaterally Skin: no lesions on visible extremities   Assessment and plan: 70 y.o. male with  Periumbilical pain, worse with eating  CT scan basic lab workup has failed to show etiology of his pains. I recommended we proceed with upper endoscopy and also colonoscopy for further evaluation.   Owens Loffler, MD [facility] 12/15/2015, 11:22 AM  Cc: Arnetha Courser, MD

## 2015-12-18 ENCOUNTER — Encounter: Payer: Self-pay | Admitting: Gastroenterology

## 2015-12-22 ENCOUNTER — Encounter: Payer: Self-pay | Admitting: Gastroenterology

## 2015-12-22 ENCOUNTER — Ambulatory Visit (AMBULATORY_SURGERY_CENTER): Payer: Medicare Other | Admitting: Gastroenterology

## 2015-12-22 VITALS — BP 128/66 | HR 69 | Temp 97.8°F | Resp 13 | Ht 72.0 in | Wt 222.0 lb

## 2015-12-22 DIAGNOSIS — G4733 Obstructive sleep apnea (adult) (pediatric): Secondary | ICD-10-CM | POA: Diagnosis not present

## 2015-12-22 DIAGNOSIS — K2990 Gastroduodenitis, unspecified, without bleeding: Secondary | ICD-10-CM | POA: Diagnosis not present

## 2015-12-22 DIAGNOSIS — R109 Unspecified abdominal pain: Secondary | ICD-10-CM | POA: Diagnosis not present

## 2015-12-22 DIAGNOSIS — R1033 Periumbilical pain: Secondary | ICD-10-CM | POA: Diagnosis present

## 2015-12-22 DIAGNOSIS — K2970 Gastritis, unspecified, without bleeding: Secondary | ICD-10-CM | POA: Diagnosis not present

## 2015-12-22 DIAGNOSIS — B9681 Helicobacter pylori [H. pylori] as the cause of diseases classified elsewhere: Secondary | ICD-10-CM | POA: Diagnosis not present

## 2015-12-22 DIAGNOSIS — K2950 Unspecified chronic gastritis without bleeding: Secondary | ICD-10-CM | POA: Diagnosis not present

## 2015-12-22 MED ORDER — SODIUM CHLORIDE 0.9 % IV SOLN: 500.0000 mL | INTRAVENOUS | Status: DC

## 2015-12-22 NOTE — Progress Notes (Signed)
Called to room to assist during endoscopic procedure.  Patient ID and intended procedure confirmed with present staff. Received instructions for my participation in the procedure from the performing physician.  

## 2015-12-22 NOTE — Op Note (Signed)
[place] Patient Name: Kate Stoneburner Procedure Date: 12/22/2015 3:15 PM MRN: [id number] Endoscopist: Milus Banister , MD Age: 70 Date of Birth: Jan 16, 1946 Gender: Male Procedure:                Colonoscopy Indications:              Periumbilical abdominal pain Medicines:                Monitored Anesthesia Care Procedure:                Pre-Anesthesia Assessment:                           - Prior to the procedure, a History and Physical                            was performed, and patient medications and                            allergies were reviewed. The patient's tolerance of                            previous anesthesia was also reviewed. The risks                            and benefits of the procedure and the sedation                            options and risks were discussed with the patient.                            All questions were answered, and informed consent                            was obtained. Prior Anticoagulants: The patient has                            taken no previous anticoagulant or antiplatelet                            agents. ASA Grade Assessment: II - A patient with                            mild systemic disease. After reviewing the risks                            and benefits, the patient was deemed in                            satisfactory condition to undergo the procedure.                           After obtaining informed consent, the colonoscope  was passed under direct vision. Throughout the                            procedure, the patient's blood pressure, pulse, and                            oxygen saturations were monitored continuously. The                            Model CF-HQ190L [id number] scope was introduced                            through the anus and advanced to the the cecum,                            identified by appendiceal orifice and ileocecal                             valve. The colonoscopy was performed without                            difficulty. The patient tolerated the procedure                            well. The quality of the bowel preparation was                            good. The ileocecal valve, appendiceal orifice, and                            rectum were photographed. Scope In: 3:21:31 PM Scope Out: 3:34:18 PM Scope Withdrawal Time: 0 hours 9 minutes 19 seconds  Total Procedure Duration: 0 hours 12 minutes 47 seconds  Findings:                 A diffuse area of moderate melanosis was found in                            the entire colon.                           The exam was otherwise without abnormality on                            direct and retroflexion views. Complications:            No immediate complications. Estimated blood loss:                            None. Estimated Blood Loss:     Estimated blood loss: none. Impression:               - Melanosis in the colon. This is a staining of the  colon from laxatives.                           - The examination was otherwise normal on direct                            and retroflexion views.                           - No specimens collected. Recommendation:           - Patient has a contact number available for                            emergencies. The signs and symptoms of potential                            delayed complications were discussed with the                            patient. Return to normal activities tomorrow.                            Written discharge instructions were provided to the                            patient.                           - Resume previous diet.                           - Continue present medications.                           - Repeat colonoscopy in 10 years for screening                            purposes. Milus Banister, MD 12/22/2015 3:43:06 PM This report has been signed electronically.

## 2015-12-22 NOTE — Progress Notes (Signed)
A/ox3 pleased with MAC, report to Penny RN 

## 2015-12-22 NOTE — Patient Instructions (Signed)

## 2015-12-22 NOTE — Op Note (Signed)
[facility] Patient Name: Jared Cline Procedure Date: 12/22/2015 3:35 PM MRN: [id number] Endoscopist: Milus Banister , MD Age: 70 Date of Birth: 08-Aug-1946 Gender: Male Procedure:                Upper GI endoscopy Indications:              Periumbilical abdominal pain Medicines:                Monitored Anesthesia Care Procedure:                Pre-Anesthesia Assessment:                           - Prior to the procedure, a History and Physical                            was performed, and patient medications and                            allergies were reviewed. The patient's tolerance of                            previous anesthesia was also reviewed. The risks                            and benefits of the procedure and the sedation                            options and risks were discussed with the patient.                            All questions were answered, and informed consent                            was obtained. Prior Anticoagulants: The patient has                            taken no previous anticoagulant or antiplatelet                            agents. ASA Grade Assessment: II - A patient with                            mild systemic disease. After reviewing the risks                            and benefits, the patient was deemed in                            satisfactory condition to undergo the procedure.                           - Prior to the procedure, a History and Physical  was performed, and patient medications and                            allergies were reviewed. The patient's tolerance of                            previous anesthesia was also reviewed. The risks                            and benefits of the procedure and the sedation                            options and risks were discussed with the patient.                            All questions were answered, and informed consent   was obtained. Prior Anticoagulants: The patient has                            taken no previous anticoagulant or antiplatelet                            agents. ASA Grade Assessment: II - A patient with                            mild systemic disease. After reviewing the risks                            and benefits, the patient was deemed in                            satisfactory condition to undergo the procedure.                           After obtaining informed consent, the endoscope was                            passed under direct vision. Throughout the                            procedure, the patient's blood pressure, pulse, and                            oxygen saturations were monitored continuously. The                            Model GIF-[id number]) scope was introduced                            through the mouth, and advanced to the second part                            of duodenum. The upper GI endoscopy was  accomplished without difficulty. The patient                            tolerated the procedure well. Scope In: Scope Out: Findings:                 The esophagus was normal.                           Diffuse moderate inflammation characterized by                            congestion (edema), erythema and granularity was                            found in the entire examined stomach. Biopsies were                            taken with a cold forceps for histology.                           The examined duodenum was normal. Complications:            No immediate complications. Estimated blood loss:                            None. Estimated Blood Loss:     Estimated blood loss: none. Impression:               - Normal esophagus.                           - Gastritis. Biopsied.                           - Normal examined duodenum. Recommendation:           - Resume previous diet.                           - Continue present  medications.                           - Await pathology results.                           - If the biopsies show H. pylori, you will be                            started on appropriate antibiotics. Milus Banister, MD 12/22/2015 3:46:29 PM This report has been signed electronically.

## 2015-12-25 ENCOUNTER — Telehealth: Payer: Self-pay

## 2015-12-25 NOTE — Telephone Encounter (Signed)
  Follow up Call-  Call back number 12/22/2015  Post procedure Call Back phone  # [phone]  Permission to leave phone message Yes     Patient questions:  Do you have a fever, pain , or abdominal swelling? No. Pain Score  0 *  Have you tolerated food without any problems? Yes.    Have you been able to return to your normal activities? Yes.    Do you have any questions about your discharge instructions: Diet   No. Medications  No. Follow up visit  No.  Do you have questions or concerns about your Care? No.  Actions: * If pain score is 4 or above: No action needed, pain <4.  No problems per the pt. maw

## 2015-12-26 DIAGNOSIS — G4733 Obstructive sleep apnea (adult) (pediatric): Secondary | ICD-10-CM | POA: Diagnosis not present

## 2015-12-27 ENCOUNTER — Telehealth: Payer: Self-pay | Admitting: Gastroenterology

## 2015-12-27 ENCOUNTER — Other Ambulatory Visit: Payer: Self-pay

## 2015-12-27 MED ORDER — BIS SUBCIT-METRONID-TETRACYC 140-125-125 MG PO CAPS: 3.0000 | ORAL_CAPSULE | Freq: Three times a day (TID) | ORAL | Status: DC

## 2015-12-27 NOTE — Telephone Encounter (Signed)
Opened in error

## 2015-12-28 ENCOUNTER — Telehealth: Payer: Self-pay

## 2015-12-28 NOTE — Telephone Encounter (Signed)
Dr Ardis Hughs I sent in pylera for the pt and it was to expensive so I then sent in the breakdown an you recommended but the tetracycline is $300 and is more expensive than the pylera, is there another substitute for the tetracycline?

## 2015-12-28 NOTE — Telephone Encounter (Signed)
Alternative medications called in per Dr Ardis Hughs recommendations on the path result.  The pharmacist verified the order verbally.  The pt will be called

## 2015-12-29 ENCOUNTER — Telehealth: Payer: Self-pay | Admitting: Gastroenterology

## 2015-12-29 NOTE — Telephone Encounter (Signed)
No there isn't but he can have prev-pac type treatment (which is now considered second line).  If you can prescribe one prev-pac.  Thanks

## 2015-12-29 NOTE — Telephone Encounter (Signed)
The pharmacy was called and the prev pac is not covered either, it can be broken down and sent separately.  The pharmacist will call if this is also an issue.

## 2015-12-29 NOTE — Telephone Encounter (Signed)
Pharmacy advised to break up the prevpac and give separately.

## 2016-01-26 DIAGNOSIS — G4733 Obstructive sleep apnea (adult) (pediatric): Secondary | ICD-10-CM | POA: Diagnosis not present

## 2016-02-21 ENCOUNTER — Telehealth: Payer: Self-pay | Admitting: Family Medicine

## 2016-02-21 ENCOUNTER — Encounter: Payer: Self-pay | Admitting: Family Medicine

## 2016-02-21 NOTE — Telephone Encounter (Signed)
I don't recall doing any paperwork for him, so I'm not sure what that is all about His last PSA was undetectable, so I'm happy for you to send this note saying that he has a history of prostate cancer, but the most recent PSA to check for recurrence was undetectable and there is no evidence that he has any current cancer

## 2016-02-21 NOTE — Telephone Encounter (Signed)
Patient and wife (glenda) would like to speak with you. He was denied [place] due to him having cancer in 2003, but has always had that insurance. The insurance need verification that he no longer have prostate cancer. Please recheck the paperwork that you had submitted to them and please return patient call to discuss and help figure this out. [employer] had received paperwork from you on 29th of June.

## 2016-02-22 ENCOUNTER — Encounter: Payer: Self-pay | Admitting: Family Medicine

## 2016-02-22 NOTE — Telephone Encounter (Signed)
I called patient this morning and informed him that the paperwork that the insurance company received were his medical records, which was requested from [facility] on behalf of [employer] (request is in patient's chart).  Patient needs a letter sent to [facility] stating that patient had prostate cancer surgery which removed the cancer and also stating that his current PSA level doesn't detect cancer.  [facility] Pt. Information Acct. # [id number] Policy # [id number]  Address: [place], [place]                 Fax # [phone]

## 2016-02-22 NOTE — Telephone Encounter (Signed)
Letter is ready; thank you

## 2016-02-29 ENCOUNTER — Telehealth: Payer: Self-pay | Admitting: Family Medicine

## 2016-02-29 NOTE — Telephone Encounter (Signed)
Pt notified will make appt to discuss.

## 2016-02-29 NOTE — Telephone Encounter (Signed)
Pt wife is asking for a call about her husband. He has been declined for [employer] due to being a Diabetic with Kidney trouble and Cancer and that the patient does not know anything about having Kideny trouble and being a Diabetic for he is not on any medication for these things.Please call [phone]

## 2016-02-29 NOTE — Telephone Encounter (Signed)
I would be happy to have them schedule a visit if several things to discuss I can tell them that I have no indication in my records that he has diabetes, so I don't know where they insurance company got that from unless they drew their own labs He does have stage 2 chronic kidney disease, as so most Americans who are over 71 years old; that's just a slow gradual decline in kidney function; not a worry

## 2016-03-05 ENCOUNTER — Encounter: Payer: Self-pay | Admitting: Family Medicine

## 2016-03-05 ENCOUNTER — Ambulatory Visit (INDEPENDENT_AMBULATORY_CARE_PROVIDER_SITE_OTHER): Payer: Medicare Other | Admitting: Family Medicine

## 2016-03-05 VITALS — BP 136/80 | HR 80 | Temp 98.4°F | Resp 16 | Wt 237.0 lb

## 2016-03-05 DIAGNOSIS — R918 Other nonspecific abnormal finding of lung field: Secondary | ICD-10-CM

## 2016-03-05 DIAGNOSIS — Z8546 Personal history of malignant neoplasm of prostate: Secondary | ICD-10-CM

## 2016-03-05 DIAGNOSIS — I10 Essential (primary) hypertension: Secondary | ICD-10-CM

## 2016-03-05 DIAGNOSIS — N182 Chronic kidney disease, stage 2 (mild): Secondary | ICD-10-CM | POA: Diagnosis not present

## 2016-03-05 NOTE — Patient Instructions (Addendum)
Check out the ZERO prostate cancer walk next June If you need something for aches or pains, try to use Tylenol (acetaminophen) instead of non-steroidals (which include Aleve, ibuprofen, Advil, Motrin, and naproxen); non-steroidals can cause long-term kidney damage

## 2016-03-05 NOTE — Assessment & Plan Note (Signed)
Last PSA undetectable; no evidence of recurrence of prostate cancer

## 2016-03-05 NOTE — Progress Notes (Signed)
BP 136/80 mmHg  Pulse 80  Temp(Src) 98.4 F (36.9 C) (Oral)  Resp 16  Wt 237 lb (107.502 kg)  SpO2 98%   Subjective:    Patient ID: Jared Cline, male    DOB: March 03, 1946, 70 y.o.   MRN: [id number]  HPI: Jared Cline is a 70 y.o. male  Chief Complaint  Patient presents with  . discuss life ins.   Patient is here to discuss life insurance issue The insurance company thinks he has prostate cancer and kidney disease and diabetes He is wanting a Quarry manager to [employer] I wrote a Quarry manager on July 6th, please see copy in the chart; we reviewed that today; it did not address diabetes or renal disease, just his prostate cancer history He does not in fact have diabetes; last glucose was only 118 and prior was under 100 Prostate cancer cut out years ago and last PSA undetectable He is otherwise feeling good  Depression screen PHQ 2/9 03/05/2016  Decreased Interest 0  Down, Depressed, Hopeless 0  PHQ - 2 Score 0   Relevant past medical, surgical, family and social history reviewed  Past Medical History  Diagnosis Date  . Hyperlipidemia   . Hypertension   . CKD (chronic kidney disease) stage 2, GFR 60-89 ml/min   . Carpal tunnel syndrome, left     left arm  . Bradycardia     evaluated by Dr. Nehemiah Massed  . History of prostate cancer   . BPH (benign prostatic hypertrophy)   . Impotence   . OA (osteoarthritis) of knee     right  . Torn meniscus     x 2; right knee  . HNP (herniated nucleus pulposus), lumbar   . Diverticulitis   . Renal mass     evaluated; followed by urologist  . Multiple lung nodules     nonspecific, largest 71mm in Jan 2016; scanned March 2017; rescan 3-6 months later  . IBS (irritable bowel syndrome)   . Sleep apnea     on CPAP  . Cancer [facility])     prostate    Past Surgical History  Procedure Laterality Date  . Knee arthroscopy Right     x 2  . Prostatectomy      robotic  . Lumbar laminectomy    . Abdominal ultrasound  Oct 2015    Negative  for eval for AAA  . Colonoscopy     Interim medical history since last visit reviewed. Allergies and medications reviewed  Review of Systems Per HPI unless specifically indicated above     Objective:    BP 136/80 mmHg  Pulse 80  Temp(Src) 98.4 F (36.9 C) (Oral)  Resp 16  Wt 237 lb (107.502 kg)  SpO2 98%  Wt Readings from Last 3 Encounters:  03/05/16 237 lb (107.502 kg)  12/22/15 222 lb (100.699 kg)  12/15/15 222 lb 3.2 oz (100.789 kg)    Physical Exam  Cardiovascular: Normal rate and regular rhythm.   Pulmonary/Chest: Effort normal and breath sounds normal.  Skin: No pallor.  Psychiatric: He has a normal mood and affect.   Results for orders placed or performed in visit on 10/19/15  Lipid Panel w/o Chol/HDL Ratio  Result Value Ref Range   Cholesterol, Total 174 100 - 199 mg/dL   Triglycerides 71 0 - 149 mg/dL   HDL 48 >39 mg/dL   VLDL Cholesterol Cal 14 5 - 40 mg/dL   LDL Calculated 112 (H) 0 - 99 mg/dL  CBC  with Differential/Platelet  Result Value Ref Range   WBC 4.9 3.4 - 10.8 x10E3/uL   RBC 4.55 4.14 - 5.80 x10E6/uL   Hemoglobin 12.3 (L) 12.6 - 17.7 g/dL   Hematocrit 37.6 37.5 - 51.0 %   MCV 83 79 - 97 fL   MCH 27.0 26.6 - 33.0 pg   MCHC 32.7 31.5 - 35.7 g/dL   RDW 13.1 12.3 - 15.4 %   Platelets 343 150 - 379 x10E3/uL   Neutrophils 39 %   Lymphs 48 %   Monocytes 10 %   Eos 2 %   Basos 1 %   Neutrophils Absolute 1.9 1.4 - 7.0 x10E3/uL   Lymphocytes Absolute 2.3 0.7 - 3.1 x10E3/uL   Monocytes Absolute 0.5 0.1 - 0.9 x10E3/uL   EOS (ABSOLUTE) 0.1 0.0 - 0.4 x10E3/uL   Basophils Absolute 0.1 0.0 - 0.2 x10E3/uL   Immature Granulocytes 0 %   Immature Grans (Abs) 0.0 0.0 - 0.1 x10E3/uL  Comprehensive metabolic panel  Result Value Ref Range   Glucose 118 (H) 65 - 99 mg/dL   BUN 12 8 - 27 mg/dL   Creatinine, Ser 1.10 0.76 - 1.27 mg/dL   GFR calc non Af Amer 67 >59 mL/min/1.73   GFR calc Af Amer 78 >59 mL/min/1.73   BUN/Creatinine Ratio 11 10 - 22   Sodium  135 134 - 144 mmol/L   Potassium 4.2 3.5 - 5.2 mmol/L   Chloride 98 96 - 106 mmol/L   CO2 25 18 - 29 mmol/L   Calcium 9.4 8.6 - 10.2 mg/dL   Total Protein 6.9 6.0 - 8.5 g/dL   Albumin 4.3 3.5 - 4.8 g/dL   Globulin, Total 2.6 1.5 - 4.5 g/dL   Albumin/Globulin Ratio 1.7 1.1 - 2.5   Bilirubin Total <0.2 0.0 - 1.2 mg/dL   Alkaline Phosphatase 75 39 - 117 IU/L   AST 17 0 - 40 IU/L   ALT 11 0 - 44 IU/L  UA/M w/rflx Culture, Routine  Result Value Ref Range   Specific Gravity, UA 1.020 1.005 - 1.030   pH, UA 6.5 5.0 - 7.5   Color, UA Yellow Yellow   Appearance Ur Clear Clear   Leukocytes, UA Negative Negative   Protein, UA Negative Negative/Trace   Glucose, UA Negative Negative   Ketones, UA Negative Negative   RBC, UA Negative Negative   Bilirubin, UA Negative Negative   Urobilinogen, Ur 0.2 0.2 - 1.0 mg/dL   Nitrite, UA Negative Negative  PSA  Result Value Ref Range   Prostate Specific Ag, Serum <0.1 0.0 - 4.0 ng/mL      Assessment & Plan:   Problem List Items Addressed This Visit      Cardiovascular and Mediastinum   Hypertension    Well-controlled today        Genitourinary   CKD (chronic kidney disease) stage 2, GFR 60-89 ml/min - Primary    Explained diagnosis; many Americans over age 27 have stage 2 CKD; hydration encouraged; avoid NSAIDs; I do not expect this to cause a problem for him for decades and we'll just follow        Other   Multiple lung nodules    Due for recheck soon; this part of the history was discovered AFTER the patient left the office, after I had already typed up his letter; I called and left a message explaining that we did not address this in the letter to his insurance company and I'll need to update the  letter; we need to recheck his scan (recommended 3-6 months after last, done in March 2017); left message to please call me      Relevant Orders   CT Chest W Contrast   History of prostate cancer    Last PSA undetectable; no evidence of  recurrence of prostate cancer         Follow up plan: Return if symptoms worsen or fail to improve.  An after-visit summary was printed and given to the patient at [facility].  Please see the patient instructions which may contain other information and recommendations beyond what is mentioned above in the assessment and plan.  Orders Placed This Encounter  Procedures  . CT Chest W Contrast   Letter given to patient at end of visit; lung nodule follow-up due, discovered AFTER our visit, phone call made to patient, explaining needs appended letter, asked him to call the office

## 2016-03-05 NOTE — Assessment & Plan Note (Signed)
Explained diagnosis; many Americans over age 71 have stage 2 CKD; hydration encouraged; avoid NSAIDs; I do not expect this to cause a problem for him for decades and we'll just follow

## 2016-03-05 NOTE — Assessment & Plan Note (Addendum)
Due for recheck soon; this part of the history was discovered AFTER the patient left the office, after I had already typed up his letter; I called and left a message explaining that we did not address this in the letter to his insurance company and I'll need to update the letter; we need to recheck his scan (recommended 3-6 months after last, done in March 2017); left message to please call me

## 2016-03-05 NOTE — Assessment & Plan Note (Signed)
Well controlled today.

## 2016-03-19 ENCOUNTER — Ambulatory Visit
Admission: RE | Admit: 2016-03-19 | Discharge: 2016-03-19 | Disposition: A | Discharge status: Home / Self Care | Payer: Medicare Other | Source: Ambulatory Visit | Attending: Family Medicine | Admitting: Family Medicine

## 2016-03-19 DIAGNOSIS — R918 Other nonspecific abnormal finding of lung field: Secondary | ICD-10-CM | POA: Diagnosis not present

## 2016-03-19 LAB — POCT I-STAT CREATININE: CREATININE: 1.3 mg/dL — AB (ref 0.61–1.24)

## 2016-03-19 MED ORDER — IOPAMIDOL (ISOVUE-300) INJECTION 61%
75.0000 mL | Freq: Once | INTRAVENOUS | Status: AC | PRN
  Administered 2016-03-19: 75 mL via INTRAVENOUS

## 2016-04-01 ENCOUNTER — Other Ambulatory Visit: Payer: Self-pay

## 2016-04-02 MED ORDER — HYDROCHLOROTHIAZIDE 25 MG PO TABS: 25.0000 mg | ORAL_TABLET | Freq: Every day | ORAL | 1 refills | Status: DC

## 2016-04-02 NOTE — Telephone Encounter (Signed)
Bmp from March reviewed; Rx approved

## 2016-06-12 ENCOUNTER — Other Ambulatory Visit: Payer: Self-pay | Admitting: Family Medicine

## 2016-07-14 ENCOUNTER — Other Ambulatory Visit: Payer: Self-pay | Admitting: Family Medicine

## 2016-07-25 ENCOUNTER — Other Ambulatory Visit: Payer: Self-pay | Admitting: Family Medicine

## 2016-07-26 MED ORDER — RANITIDINE HCL 150 MG PO TABS: 150.0000 mg | ORAL_TABLET | Freq: Two times a day (BID) | ORAL | 2 refills | Status: DC | PRN

## 2016-07-26 NOTE — Telephone Encounter (Signed)
Left voice mail

## 2016-07-26 NOTE — Telephone Encounter (Signed)
I received a refill request for omeprazole Please call patient Let him know that if he is having heartburn, we prefer to use a different family of drug for that now The PPIs like omeprazole carry significant risks I'll send in new Rx Please avoid triggers (orange juice, tomato-based foods like spaghetti sauce, green and red peppers, onions, chocolate, peppermint, coffee, soft drinks, etc.) Thank you

## 2016-08-01 ENCOUNTER — Other Ambulatory Visit: Payer: Self-pay | Admitting: Family Medicine

## 2016-08-20 NOTE — Telephone Encounter (Signed)
Please contact patient; he'll be due for 6 month visit in late January; please ask him to schedule and come fasting for labs; I sent refill from December request; I apologize; we had a computer glitch and I was not receiving Rx requests from outside pharmacies; I did sent that in today

## 2016-08-21 NOTE — Telephone Encounter (Signed)
Left voice message yesterday and today for patient to return call.

## 2016-10-28 ENCOUNTER — Telehealth: Payer: Self-pay | Admitting: Family Medicine

## 2016-10-28 NOTE — Telephone Encounter (Signed)
Please see 08/01/16 note It is still open Please ask patient to schedule an appt and come fasting for labs His last set of fasting labs were more than a year ago Thank you

## 2016-10-28 NOTE — Telephone Encounter (Signed)
Left vm informing pt

## 2016-10-29 NOTE — Telephone Encounter (Signed)
appt made for Monday 11-04-16

## 2016-11-04 ENCOUNTER — Ambulatory Visit (INDEPENDENT_AMBULATORY_CARE_PROVIDER_SITE_OTHER): Payer: Medicare Other | Admitting: Family Medicine

## 2016-11-04 ENCOUNTER — Encounter: Payer: Self-pay | Admitting: Family Medicine

## 2016-11-04 DIAGNOSIS — N182 Chronic kidney disease, stage 2 (mild): Secondary | ICD-10-CM

## 2016-11-04 DIAGNOSIS — I10 Essential (primary) hypertension: Secondary | ICD-10-CM | POA: Diagnosis not present

## 2016-11-04 DIAGNOSIS — R918 Other nonspecific abnormal finding of lung field: Secondary | ICD-10-CM

## 2016-11-04 DIAGNOSIS — E782 Mixed hyperlipidemia: Secondary | ICD-10-CM

## 2016-11-04 DIAGNOSIS — Z8546 Personal history of malignant neoplasm of prostate: Secondary | ICD-10-CM

## 2016-11-04 NOTE — Patient Instructions (Signed)
Request records from Dr. Yves Dill (urologist) Keep up the great job with managing your weight and diet Return for fasting labs in the next week or two You'll be due for a chest CT in early August

## 2016-11-04 NOTE — Assessment & Plan Note (Signed)
Controlled; praise given for weight loss; try to follow DASH guidelines

## 2016-11-04 NOTE — Assessment & Plan Note (Signed)
Last PSA was March 2017; order and send to urologist

## 2016-11-04 NOTE — Assessment & Plan Note (Signed)
Check Cr, avoid NSAIDs, hydrated

## 2016-11-04 NOTE — Assessment & Plan Note (Signed)
Due for recheck scan around August 2018; discussed with patient

## 2016-11-04 NOTE — Assessment & Plan Note (Signed)
Check labs; last intake 4 hours ago; he'll return for truly fasting labs

## 2016-11-04 NOTE — Progress Notes (Signed)
BP 128/84   Pulse 84   Temp 98.1 F (36.7 C) (Oral)   Resp 14   Wt 227 lb 1.6 oz (103 kg)   SpO2 97%   BMI 30.80 kg/m    Subjective:    Patient ID: Jared Cline, male    DOB: 1945/10/14, 71 y.o.   MRN: [id number]  HPI: Jared Cline is a 71 y.o. male  Chief Complaint  Patient presents with  . Follow-up   Patient is here for f/u Last cholesterol LDL 112; he is watching his diet; watching what he eats; limited hot dogs and bologna; getting fruits and veggies; more fish  CKD; avoiding NSAIDs; using only tylenol  No flu this season  Colonoscopy and EGD and no further studies for 5 years  Obesity; down 10 pounds since July; eating better  Depression screen [facility] 2/9 11/04/2016 03/05/2016  Decreased Interest 0 0  Down, Depressed, Hopeless 0 0  PHQ - 2 Score 0 0   Relevant past medical, surgical, family and social history reviewed Past Medical History:  Diagnosis Date  . BPH (benign prostatic hypertrophy)   . Bradycardia    evaluated by Dr. Nehemiah Massed  . Cancer [facility])    prostate  . Carpal tunnel syndrome, left    left arm  . CKD (chronic kidney disease) stage 2, GFR 60-89 ml/min   . Diverticulitis   . History of prostate cancer   . HNP (herniated nucleus pulposus), lumbar   . Hyperlipidemia   . Hypertension   . IBS (irritable bowel syndrome)   . Impotence   . Multiple lung nodules    nonspecific, largest 85mm in Jan 2016; scanned March 2017; rescan 3-6 months later  . OA (osteoarthritis) of knee    right  . Renal mass    evaluated; followed by urologist  . Sleep apnea    on CPAP  . Torn meniscus    x 2; right knee   Past Surgical History:  Procedure Laterality Date  . abdominal ultrasound  Oct 2015   Negative for eval for AAA  . COLONOSCOPY    . KNEE ARTHROSCOPY Right    x 2  . LUMBAR LAMINECTOMY    . PROSTATECTOMY     robotic   Family History  Problem Relation Age of Onset  . Stroke Mother   . Hypertension Mother   . Aneurysm Father     cerebral  .  Hypertension Father   . Heart disease Paternal Uncle   . Alcohol abuse Brother   . Cancer Neg Hx   . COPD Neg Hx   . Diabetes Neg Hx    Social History  Substance Use Topics  . Smoking status: Former Smoker    Packs/day: 0.50    Years: 10.00    Types: Cigarettes    Quit date: 08/20/1971  . Smokeless tobacco: Never Used  . Alcohol use No   Interim medical history since last visit reviewed. Allergies and medications reviewed  Review of Systems  Constitutional: Negative for unexpected weight change (intentional).   Per HPI unless specifically indicated above     Objective:    BP 128/84   Pulse 84   Temp 98.1 F (36.7 C) (Oral)   Resp 14   Wt 227 lb 1.6 oz (103 kg)   SpO2 97%   BMI 30.80 kg/m   Wt Readings from Last 3 Encounters:  11/04/16 227 lb 1.6 oz (103 kg)  03/05/16 237 lb (107.5 kg)  12/22/15 222 lb (100.7  kg)    Physical Exam  Constitutional: He appears well-developed and well-nourished. No distress.  Eyes: No scleral icterus.  Neck: No JVD present.  Cardiovascular: Normal rate and regular rhythm.   Pulmonary/Chest: Effort normal and breath sounds normal.  Abdominal: He exhibits no distension.  Musculoskeletal: He exhibits no edema.  Neurological: He is alert.  Skin: No pallor.  Psychiatric: He has a normal mood and affect.    Results for orders placed or performed during the hospital encounter of 03/19/16  I-STAT creatinine  Result Value Ref Range   Creatinine, Ser 1.30 (H) 0.61 - 1.24 mg/dL      Assessment & Plan:   Problem List Items Addressed This Visit      Cardiovascular and Mediastinum   Hypertension    Controlled; praise given for weight loss; try to follow DASH guidelines        Genitourinary   CKD (chronic kidney disease) stage 2, GFR 60-89 ml/min    Check Cr, avoid NSAIDs, hydrated        Other   Multiple lung nodules    Due for recheck scan around August 2018; discussed with patient      Hyperlipidemia    Check labs; last  intake 4 hours ago; he'll return for truly fasting labs      History of prostate cancer    Last PSA was March 2017; order and send to urologist          Follow up plan: Return in about 7 months (around 05/26/2017) for fasting labs and visit.  An after-visit summary was printed and given to the patient at [facility].  Please see the patient instructions which may contain other information and recommendations beyond what is mentioned above in the assessment and plan.  No orders of the defined types were placed in this encounter.   No orders of the defined types were placed in this encounter.

## 2016-11-05 ENCOUNTER — Other Ambulatory Visit: Payer: Self-pay

## 2016-11-05 MED ORDER — CLONIDINE HCL 0.3 MG PO TABS: 0.3000 mg | ORAL_TABLET | Freq: Two times a day (BID) | ORAL | 2 refills | Status: DC

## 2016-11-05 NOTE — Telephone Encounter (Signed)
Patient needs 90 day supply

## 2016-11-06 ENCOUNTER — Telehealth: Payer: Self-pay

## 2016-11-06 NOTE — Telephone Encounter (Signed)
Patient is coming in Monday am for fasting labs please place orders.

## 2016-11-11 ENCOUNTER — Other Ambulatory Visit: Payer: Self-pay

## 2016-11-11 ENCOUNTER — Other Ambulatory Visit: Payer: Medicare Other

## 2016-11-11 DIAGNOSIS — Z5181 Encounter for therapeutic drug level monitoring: Secondary | ICD-10-CM | POA: Diagnosis not present

## 2016-11-11 DIAGNOSIS — E782 Mixed hyperlipidemia: Secondary | ICD-10-CM

## 2016-11-11 DIAGNOSIS — I10 Essential (primary) hypertension: Secondary | ICD-10-CM | POA: Diagnosis not present

## 2016-11-11 LAB — COMPLETE METABOLIC PANEL WITH GFR
ALT: 13 U/L (ref 9–46)
AST: 19 U/L (ref 10–35)
Albumin: 4.3 g/dL (ref 3.6–5.1)
Alkaline Phosphatase: 60 U/L (ref 40–115)
BUN: 17 mg/dL (ref 7–25)
CALCIUM: 9.6 mg/dL (ref 8.6–10.3)
CHLORIDE: 102 mmol/L (ref 98–110)
CO2: 25 mmol/L (ref 20–31)
Creat: 1.16 mg/dL (ref 0.70–1.18)
GFR, EST AFRICAN AMERICAN: 72 mL/min (ref >=60)
GFR, Est Non African American: 63 mL/min (ref >=60)
Glucose, Bld: 87 mg/dL (ref 65–99)
POTASSIUM: 4 mmol/L (ref 3.5–5.3)
Sodium: 137 mmol/L (ref 135–146)
Total Bilirubin: 0.6 mg/dL (ref 0.2–1.2)
Total Protein: 7.2 g/dL (ref 6.1–8.1)

## 2016-11-11 LAB — LIPID PANEL
CHOL/HDL RATIO: 3.1 ratio (ref <5.0)
CHOLESTEROL: 174 mg/dL (ref <200)
HDL: 56 mg/dL (ref >40)
LDL Cholesterol: 109 mg/dL — ABNORMAL HIGH (ref <100)
TRIGLYCERIDES: 44 mg/dL (ref <150)
VLDL: 9 mg/dL (ref <30)

## 2016-11-11 NOTE — Telephone Encounter (Signed)
Per Dr. Delight Ovens request ordered CMP and Lipid panel

## 2016-11-30 ENCOUNTER — Other Ambulatory Visit: Payer: Self-pay | Admitting: Family Medicine

## 2016-11-30 NOTE — Telephone Encounter (Signed)
Last CMP reviewed Rx approved 

## 2017-03-11 ENCOUNTER — Telehealth: Payer: Self-pay | Admitting: Family Medicine

## 2017-03-11 DIAGNOSIS — R918 Other nonspecific abnormal finding of lung field: Secondary | ICD-10-CM

## 2017-03-11 NOTE — Assessment & Plan Note (Signed)
rescan due August 2018

## 2017-03-11 NOTE — Telephone Encounter (Signed)
-----   Message from Arnetha Courser, MD sent at 11/04/2016  3:46 PM EDT ----- Regarding: order chest CT Time to order chest CT to f/u lung nodules

## 2017-03-11 NOTE — Telephone Encounter (Signed)
Please let pt know that it's time to do his repeat chest CT I've put in orders for his scan and to recheck his kidney function before they give him contrast Thank you

## 2017-03-12 NOTE — Telephone Encounter (Signed)
Left detailed voicemail

## 2017-03-19 ENCOUNTER — Telehealth: Payer: Self-pay | Admitting: Family Medicine

## 2017-03-19 NOTE — Telephone Encounter (Signed)
[facility] ( [facility] [phone]) SAYS THAT PT IS HAVING A CT OF CHEST DIAG LUNG NODULES AND IT NEEDS PRECERT BEFORE TEST CAN BE DONE. HIS APPT IS 03-20-17 AT 10AM.

## 2017-03-19 NOTE — Telephone Encounter (Signed)
Auth approved, and notified auth # [id number] valid  03/19/17-04/17/17

## 2017-03-20 ENCOUNTER — Other Ambulatory Visit: Payer: Self-pay | Admitting: Family Medicine

## 2017-03-20 ENCOUNTER — Ambulatory Visit
Admission: RE | Admit: 2017-03-20 | Discharge: 2017-03-20 | Disposition: A | Discharge status: Home / Self Care | Payer: Medicare Other | Source: Ambulatory Visit | Attending: Family Medicine | Admitting: Family Medicine

## 2017-03-20 DIAGNOSIS — I2510 Atherosclerotic heart disease of native coronary artery without angina pectoris: Secondary | ICD-10-CM | POA: Insufficient documentation

## 2017-03-20 DIAGNOSIS — R918 Other nonspecific abnormal finding of lung field: Secondary | ICD-10-CM | POA: Insufficient documentation

## 2017-03-20 DIAGNOSIS — I700 Atherosclerosis of aorta: Secondary | ICD-10-CM | POA: Diagnosis not present

## 2017-03-20 LAB — POCT I-STAT CREATININE: Creatinine, Ser: 1.2 mg/dL (ref 0.61–1.24)

## 2017-03-20 MED ORDER — IOPAMIDOL (ISOVUE-300) INJECTION 61%
75.0000 mL | Freq: Once | INTRAVENOUS | Status: AC | PRN
  Administered 2017-03-20: 75 mL via INTRAVENOUS

## 2017-03-20 MED ORDER — ATORVASTATIN CALCIUM 20 MG PO TABS: 20.0000 mg | ORAL_TABLET | Freq: Every day | ORAL | 1 refills | Status: DC

## 2017-03-20 NOTE — Assessment & Plan Note (Signed)
Goal LDL now under 70; continue aspirin; start statin; message to cardiologist; patient is asymptomatic

## 2017-03-20 NOTE — Progress Notes (Unsigned)
I talked with patient about results; nodules are stable; rescan in one year Aortic athero; need to get LDL under 70 Also has evidence suggestive of CAD No chest pain, able to be active and do things; walks all day He used to take Lipitor but heard it might cause memory loss; more myth than EBM, so he's willing to start Start back on low dose atorvastatin and try to limit foods made from cows or pigs; recheck lipids in 7-8 weeks Continue aspirin I'll send note to Dr. Nehemiah Massed so he's aware

## 2017-03-20 NOTE — Assessment & Plan Note (Signed)
Goal LDL less than 70; add statin; continue aspirin

## 2017-05-06 ENCOUNTER — Other Ambulatory Visit: Payer: Self-pay | Admitting: Family Medicine

## 2017-05-16 DIAGNOSIS — G4733 Obstructive sleep apnea (adult) (pediatric): Secondary | ICD-10-CM | POA: Diagnosis not present

## 2017-06-02 ENCOUNTER — Other Ambulatory Visit: Payer: Self-pay | Admitting: Family Medicine

## 2017-06-02 DIAGNOSIS — I2510 Atherosclerotic heart disease of native coronary artery without angina pectoris: Secondary | ICD-10-CM

## 2017-06-02 DIAGNOSIS — I700 Atherosclerosis of aorta: Secondary | ICD-10-CM

## 2017-06-02 NOTE — Telephone Encounter (Signed)
Please remind patient to get fasting lipid panel done; I'll refill the statin; thank you

## 2017-06-02 NOTE — Telephone Encounter (Signed)
Left detailed voicemail

## 2017-06-09 ENCOUNTER — Ambulatory Visit (INDEPENDENT_AMBULATORY_CARE_PROVIDER_SITE_OTHER): Payer: Medicare Other | Admitting: Family Medicine

## 2017-06-09 ENCOUNTER — Encounter: Payer: Self-pay | Admitting: Family Medicine

## 2017-06-09 VITALS — BP 128/86 | HR 64 | Temp 98.1°F | Ht 72.0 in | Wt 228.6 lb

## 2017-06-09 DIAGNOSIS — R252 Cramp and spasm: Secondary | ICD-10-CM | POA: Diagnosis not present

## 2017-06-09 DIAGNOSIS — E782 Mixed hyperlipidemia: Secondary | ICD-10-CM | POA: Diagnosis not present

## 2017-06-09 DIAGNOSIS — I700 Atherosclerosis of aorta: Secondary | ICD-10-CM | POA: Diagnosis not present

## 2017-06-09 DIAGNOSIS — Z8546 Personal history of malignant neoplasm of prostate: Secondary | ICD-10-CM | POA: Diagnosis not present

## 2017-06-09 DIAGNOSIS — I10 Essential (primary) hypertension: Secondary | ICD-10-CM

## 2017-06-09 DIAGNOSIS — N2889 Other specified disorders of kidney and ureter: Secondary | ICD-10-CM

## 2017-06-09 DIAGNOSIS — R918 Other nonspecific abnormal finding of lung field: Secondary | ICD-10-CM

## 2017-06-09 DIAGNOSIS — I2510 Atherosclerotic heart disease of native coronary artery without angina pectoris: Secondary | ICD-10-CM | POA: Diagnosis not present

## 2017-06-09 DIAGNOSIS — N182 Chronic kidney disease, stage 2 (mild): Secondary | ICD-10-CM

## 2017-06-09 DIAGNOSIS — Z23 Encounter for immunization: Secondary | ICD-10-CM | POA: Diagnosis not present

## 2017-06-09 DIAGNOSIS — Z5181 Encounter for therapeutic drug level monitoring: Secondary | ICD-10-CM | POA: Insufficient documentation

## 2017-06-09 LAB — LIPID PANEL
CHOLESTEROL: 136 mg/dL (ref <200)
HDL: 57 mg/dL (ref >40)
LDL CHOLESTEROL (CALC): 66 mg/dL
Non-HDL Cholesterol (Calc): 79 mg/dL (calc) (ref <130)
Total CHOL/HDL Ratio: 2.4 (calc) (ref <5.0)
Triglycerides: 44 mg/dL (ref <150)

## 2017-06-09 LAB — COMPLETE METABOLIC PANEL WITH GFR
AG Ratio: 1.8 (calc) (ref 1.0–2.5)
ALBUMIN MSPROF: 4.4 g/dL (ref 3.6–5.1)
ALKALINE PHOSPHATASE (APISO): 63 U/L (ref 40–115)
ALT: 10 U/L (ref 9–46)
AST: 15 U/L (ref 10–35)
BUN / CREAT RATIO: 11 (calc) (ref 6–22)
BUN: 14 mg/dL (ref 7–25)
CALCIUM: 9.7 mg/dL (ref 8.6–10.3)
CO2: 30 mmol/L (ref 20–32)
CREATININE: 1.22 mg/dL — AB (ref 0.70–1.18)
Chloride: 103 mmol/L (ref 98–110)
GFR, EST NON AFRICAN AMERICAN: 59 mL/min/{1.73_m2} — AB (ref >=60)
GFR, Est African American: 68 mL/min/{1.73_m2} (ref >=60)
GLOBULIN: 2.5 g/dL (ref 1.9–3.7)
GLUCOSE: 106 mg/dL — AB (ref 65–99)
Potassium: 4 mmol/L (ref 3.5–5.3)
Sodium: 137 mmol/L (ref 135–146)
TOTAL PROTEIN: 6.9 g/dL (ref 6.1–8.1)
Total Bilirubin: 0.4 mg/dL (ref 0.2–1.2)

## 2017-06-09 LAB — PSA: PSA: <0.1 ng/mL (ref <=4.0)

## 2017-06-09 LAB — MAGNESIUM: MAGNESIUM: 1.9 mg/dL (ref 1.5–2.5)

## 2017-06-09 NOTE — Progress Notes (Signed)
BP 128/86 (BP Location: Left Arm, Patient Position: Sitting, Cuff Size: Large)   Pulse 64   Temp 98.1 F (36.7 C) (Oral)   Ht 6' (1.829 m)   Wt 228 lb 9.6 oz (103.7 kg)   BMI 31.00 kg/m    Subjective:    Patient ID: Jared Cline, male    DOB: 1946-06-27, 71 y.o.   MRN: [id number]  HPI: Jared Cline is a 71 y.o. male  Chief Complaint  Patient presents with  . Follow-up  . Immunizations    Wants to discuss flu vaccine, discuss to hep c screening     HPI He is here for f/u High cholesterol; came fasting for labs; on statin; trying to limit fatty meats; maybe once a week HTN; controlled, on medicine; trying to avoid salt; catching cramps in the calves at times; two or three times during the day; early mornings are worst; low magnesium; sweating a lot during preach CKD; avoiding NSAIDs Pulmonary nodules; stable over two years; reviewed last CT scan report He was referred to urologist; right renal lesion and hx of prostate cancer; I personally reviewed the last urology note on the chart Some edema around right ankle; injured over years playing baseball, sliding into base  Depression screen [facility] 2/9 06/09/2017 11/04/2016 03/05/2016  Decreased Interest 0 0 0  Down, Depressed, Hopeless 0 0 0  PHQ - 2 Score 0 0 0   Relevant past medical, surgical, family and social history reviewed Past Medical History:  Diagnosis Date  . BPH (benign prostatic hypertrophy)   . Bradycardia    evaluated by Dr. Nehemiah Massed  . Cancer [facility])    prostate  . Carpal tunnel syndrome, left    left arm  . CKD (chronic kidney disease) stage 2, GFR 60-89 ml/min   . Diverticulitis   . History of prostate cancer   . HNP (herniated nucleus pulposus), lumbar   . Hyperlipidemia   . Hypertension   . IBS (irritable bowel syndrome)   . Impotence   . Multiple lung nodules    nonspecific, largest 87mm in Jan 2016; scanned March 2017; rescan 3-6 months later  . OA (osteoarthritis) of knee    right  . Renal mass    evaluated; followed by urologist  . Sleep apnea    on CPAP  . Torn meniscus    x 2; right knee   Past Surgical History:  Procedure Laterality Date  . abdominal ultrasound  Oct 2015   Negative for eval for AAA  . COLONOSCOPY    . KNEE ARTHROSCOPY Right    x 2  . LUMBAR LAMINECTOMY    . PROSTATECTOMY     robotic   Family History  Problem Relation Age of Onset  . Stroke Mother   . Hypertension Mother   . Aneurysm Father        cerebral  . Hypertension Father   . Heart disease Paternal Uncle   . Alcohol abuse Brother   . Cancer Neg Hx   . COPD Neg Hx   . Diabetes Neg Hx    Social History   Social History  . Marital status: Married    Spouse name: N/A  . Number of children: 3  . Years of education: N/A   Occupational History  . retired city of [place] History Main Topics  . Smoking status: Former Smoker    Packs/day: 0.50    Years: 10.00    Types: Cigarettes    Quit  date: 08/20/1971  . Smokeless tobacco: Never Used  . Alcohol use No  . Drug use: No  . Sexual activity: Not Currently   Other Topics Concern  . Not on file   Social History Narrative  . No narrative on file    Interim medical history since last visit reviewed. Allergies and medications reviewed  Review of Systems Per HPI unless specifically indicated above     Objective:    BP 128/86 (BP Location: Left Arm, Patient Position: Sitting, Cuff Size: Large)   Pulse 64   Temp 98.1 F (36.7 C) (Oral)   Ht 6' (1.829 m)   Wt 228 lb 9.6 oz (103.7 kg)   BMI 31.00 kg/m   Wt Readings from Last 3 Encounters:  06/09/17 228 lb 9.6 oz (103.7 kg)  11/04/16 227 lb 1.6 oz (103 kg)  03/05/16 237 lb (107.5 kg)    Physical Exam  Constitutional: He appears well-developed and well-nourished. No distress.  HENT:  Mouth/Throat: Oropharynx is clear and moist.  Eyes: No scleral icterus.  Neck: No JVD present.  Cardiovascular: Normal rate and regular rhythm.   No murmur  heard. Pulmonary/Chest: Effort normal and breath sounds normal. He has no wheezes.  Abdominal: He exhibits no distension.  Musculoskeletal: He exhibits no edema (no pitting edema of either lower leg).  Neurological: He is alert.  Skin: No pallor.  Psychiatric: He has a normal mood and affect.    Results for orders placed or performed during the hospital encounter of 03/20/17  I-STAT creatinine  Result Value Ref Range   Creatinine, Ser 1.20 0.61 - 1.24 mg/dL      Assessment & Plan:   Problem List Items Addressed This Visit      Cardiovascular and Mediastinum   Hypertension    Controlled; try DASH guidelines      Coronary artery calcification seen on CAT scan    Asymptomatic; taking aspirin daily; statin      Aortic atherosclerosis (HCC)    Aiming to get LDL under 70        Genitourinary   CKD (chronic kidney disease) stage 2, GFR 60-89 ml/min    Check Cr; avoid NSAIDs        Other   Right renal mass    Followed by urologist; Adolph Pollack for years      Multiple lung nodules    Stable over two years, benign      Medication monitoring encounter    Check liver and kidneys      Relevant Orders   COMPLETE METABOLIC PANEL WITH GFR   Hyperlipidemia    Check fasting lipids; limit saturated fats      Relevant Orders   Lipid panel   History of prostate cancer    Check PSA; urologist sounds like he has turned patient loose      Relevant Orders   PSA    Other Visit Diagnoses    Leg cramps    -  Primary   Relevant Orders   Magnesium   Hypomagnesemia       Relevant Orders   Magnesium   Needs flu shot       Relevant Orders   Flu vaccine HIGH DOSE PF (Fluzone High dose) (Completed)   Need for vaccination against Streptococcus pneumoniae using pneumococcal conjugate vaccine 13       Relevant Orders   Pneumococcal conjugate vaccine 13-valent IM (Completed)       Follow up plan: Return in about 6 months (around 12/08/2017)  for twenty minute follow-up with  fasting labs; Medicare Wellness when due.  An after-visit summary was printed and given to the patient at [facility].  Please see the patient instructions which may contain other information and recommendations beyond what is mentioned above in the assessment and plan.  Meds ordered this encounter  Medications  . DISCONTD: potassium chloride (KLOR-CON) 8 MEQ tablet    Sig: Take by mouth.    Orders Placed This Encounter  Procedures  . Pneumococcal conjugate vaccine 13-valent IM  . Flu vaccine HIGH DOSE PF (Fluzone High dose)  . Lipid panel  . COMPLETE METABOLIC PANEL WITH GFR  . Magnesium  . PSA

## 2017-06-09 NOTE — Assessment & Plan Note (Addendum)
Followed by urologist; Jared Cline for years

## 2017-06-09 NOTE — Assessment & Plan Note (Signed)
Check Cr; avoid NSAIDs 

## 2017-06-09 NOTE — Patient Instructions (Addendum)
Try to follow the DASH guidelines (DASH stands for Dietary Approaches to Stop Hypertension) Try to limit the sodium in your diet.  Ideally, consume less than 1.5 grams (less than 1,500mg ) per day. Do not add salt when cooking or at the table.  Check the sodium amount on labels when shopping, and choose items lower in sodium when given a choice. Avoid or limit foods that already contain a lot of sodium. Eat a diet rich in fruits and vegetables and whole grains. We'll get labs today  DASH Eating Plan DASH stands for "Dietary Approaches to Stop Hypertension." The DASH eating plan is a healthy eating plan that has been shown to reduce high blood pressure (hypertension). It may also reduce your risk for type 2 diabetes, heart disease, and stroke. The DASH eating plan may also help with weight loss. What are tips for following this plan? General guidelines  Avoid eating more than 2,300 mg (milligrams) of salt (sodium) a day. If you have hypertension, you may need to reduce your sodium intake to 1,500 mg a day.  Limit alcohol intake to no more than 1 drink a day for nonpregnant women and 2 drinks a day for men. One drink equals 12 oz of beer, 5 oz of wine, or 1 oz of hard liquor.  Work with your health care provider to maintain a healthy body weight or to lose weight. Ask what an ideal weight is for you.  Get at least 30 minutes of exercise that causes your heart to beat faster (aerobic exercise) most days of the week. Activities may include walking, swimming, or biking.  Work with your health care provider or diet and nutrition specialist (dietitian) to adjust your eating plan to your individual calorie needs. Reading food labels  Check food labels for the amount of sodium per serving. Choose foods with less than 5 percent of the Daily Value of sodium. Generally, foods with less than 300 mg of sodium per serving fit into this eating plan.  To find whole grains, look for the word "whole" as the  first word in the ingredient list. Shopping  Buy products labeled as "low-sodium" or "no salt added."  Buy fresh foods. Avoid canned foods and premade or frozen meals. Cooking  Avoid adding salt when cooking. Use salt-free seasonings or herbs instead of table salt or sea salt. Check with your health care provider or pharmacist before using salt substitutes.  Do not fry foods. Cook foods using healthy methods such as baking, boiling, grilling, and broiling instead.  Cook with heart-healthy oils, such as olive, canola, soybean, or sunflower oil. Meal planning   Eat a balanced diet that includes: ? 5 or more servings of fruits and vegetables each day. At each meal, try to fill half of your plate with fruits and vegetables. ? Up to 6-8 servings of whole grains each day. ? Less than 6 oz of lean meat, poultry, or fish each day. A 3-oz serving of meat is about the same size as a deck of cards. One egg equals 1 oz. ? 2 servings of low-fat dairy each day. ? A serving of nuts, seeds, or beans 5 times each week. ? Heart-healthy fats. Healthy fats called Omega-3 fatty acids are found in foods such as flaxseeds and coldwater fish, like sardines, salmon, and mackerel.  Limit how much you eat of the following: ? Canned or prepackaged foods. ? Food that is high in trans fat, such as fried foods. ? Food that is high in  saturated fat, such as fatty meat. ? Sweets, desserts, sugary drinks, and other foods with added sugar. ? Full-fat dairy products.  Do not salt foods before eating.  Try to eat at least 2 vegetarian meals each week.  Eat more home-cooked food and less restaurant, buffet, and fast food.  When eating at a restaurant, ask that your food be prepared with less salt or no salt, if possible. What foods are recommended? The items listed may not be a complete list. Talk with your dietitian about what dietary choices are best for you. Grains Whole-grain or whole-wheat bread. Whole-grain  or whole-wheat pasta. Brown rice. [place]row. Bulgur. Whole-grain and low-sodium cereals. Pita bread. Low-fat, low-sodium crackers. Whole-wheat flour tortillas. Vegetables Fresh or frozen vegetables (raw, steamed, roasted, or grilled). Low-sodium or reduced-sodium tomato and vegetable juice. Low-sodium or reduced-sodium tomato sauce and tomato paste. Low-sodium or reduced-sodium canned vegetables. Fruits All fresh, dried, or frozen fruit. Canned fruit in natural juice (without added sugar). Meat and other protein foods Skinless chicken or [place]. Ground chicken or [place]. Pork with fat trimmed off. Fish and seafood. Egg whites. Dried beans, peas, or lentils. Unsalted nuts, nut butters, and seeds. Unsalted canned beans. Lean cuts of beef with fat trimmed off. Low-sodium, lean deli meat. Dairy Low-fat (1%) or fat-free (skim) milk. Fat-free, low-fat, or reduced-fat cheeses. Nonfat, low-sodium ricotta or cottage cheese. Low-fat or nonfat yogurt. Low-fat, low-sodium cheese. Fats and oils Soft margarine without trans fats. Vegetable oil. Low-fat, reduced-fat, or light mayonnaise and salad dressings (reduced-sodium). Canola, safflower, olive, soybean, and sunflower oils. Avocado. Seasoning and other foods Herbs. Spices. Seasoning mixes without salt. Unsalted popcorn and pretzels. Fat-free sweets. What foods are not recommended? The items listed may not be a complete list. Talk with your dietitian about what dietary choices are best for you. Grains Baked goods made with fat, such as croissants, muffins, or some breads. Dry pasta or rice meal packs. Vegetables Creamed or fried vegetables. Vegetables in a cheese sauce. Regular canned vegetables (not low-sodium or reduced-sodium). Regular canned tomato sauce and paste (not low-sodium or reduced-sodium). Regular tomato and vegetable juice (not low-sodium or reduced-sodium). Angie Fava. Olives. Fruits Canned fruit in a light or heavy syrup. Fried fruit.  Fruit in cream or butter sauce. Meat and other protein foods Fatty cuts of meat. Ribs. Fried meat. Berniece Salines. Sausage. Bologna and other processed lunch meats. Salami. Fatback. Hotdogs. Bratwurst. Salted nuts and seeds. Canned beans with added salt. Canned or smoked fish. Whole eggs or egg yolks. Chicken or [place] with skin. Dairy Whole or 2% milk, cream, and half-and-half. Whole or full-fat cream cheese. Whole-fat or sweetened yogurt. Full-fat cheese. Nondairy creamers. Whipped toppings. Processed cheese and cheese spreads. Fats and oils Butter. Stick margarine. Lard. Shortening. Ghee. Bacon fat. Tropical oils, such as coconut, palm kernel, or palm oil. Seasoning and other foods Salted popcorn and pretzels. Onion salt, garlic salt, seasoned salt, table salt, and sea salt. Worcestershire sauce. Tartar sauce. Barbecue sauce. Teriyaki sauce. Soy sauce, including reduced-sodium. Steak sauce. Canned and packaged gravies. Fish sauce. Oyster sauce. Cocktail sauce. Horseradish that you find on the shelf. Ketchup. Mustard. Meat flavorings and tenderizers. Bouillon cubes. Hot sauce and Tabasco sauce. Premade or packaged marinades. Premade or packaged taco seasonings. Relishes. Regular salad dressings. Where to find more information:  National Heart, Lung, and [place]: https://wilson-eaton.com/  American Heart Association: www.heart.org Summary  The DASH eating plan is a healthy eating plan that has been shown to reduce high blood pressure (hypertension). It may also  reduce your risk for type 2 diabetes, heart disease, and stroke.  With the DASH eating plan, you should limit salt (sodium) intake to 2,300 mg a day. If you have hypertension, you may need to reduce your sodium intake to 1,500 mg a day.  When on the DASH eating plan, aim to eat more fresh fruits and vegetables, whole grains, lean proteins, low-fat dairy, and heart-healthy fats.  Work with your health care provider or diet and nutrition  specialist (dietitian) to adjust your eating plan to your individual calorie needs. This information is not intended to replace advice given to you by your health care provider. Make sure you discuss any questions you have with your health care provider. Document Released: 07/25/2011 Document Revised: 07/29/2016 Document Reviewed: 07/29/2016 Elsevier Interactive Patient Education  2017 [employer].

## 2017-06-09 NOTE — Assessment & Plan Note (Signed)
Stable over two years, benign

## 2017-06-09 NOTE — Assessment & Plan Note (Signed)
Aiming to get LDL under 70

## 2017-06-09 NOTE — Assessment & Plan Note (Signed)
Controlled; try DASH guidelines 

## 2017-06-09 NOTE — Assessment & Plan Note (Signed)
Asymptomatic; taking aspirin daily; statin

## 2017-06-09 NOTE — Assessment & Plan Note (Signed)
Check liver and kidneys 

## 2017-06-09 NOTE — Assessment & Plan Note (Signed)
Check fasting lipids; limit saturated fats

## 2017-06-09 NOTE — Assessment & Plan Note (Signed)
Check PSA; urologist sounds like he has turned patient loose

## 2017-06-10 DIAGNOSIS — G4733 Obstructive sleep apnea (adult) (pediatric): Secondary | ICD-10-CM | POA: Diagnosis not present

## 2017-06-16 NOTE — Progress Notes (Signed)
Patient has not called back in to the [facility] 

## 2017-07-14 ENCOUNTER — Other Ambulatory Visit: Payer: Self-pay | Admitting: Family Medicine

## 2017-07-14 NOTE — Telephone Encounter (Signed)
Last lipids and sgpt reviewed; Rx approved 

## 2017-07-29 ENCOUNTER — Other Ambulatory Visit: Payer: Self-pay

## 2017-07-29 MED ORDER — HYDROCHLOROTHIAZIDE 25 MG PO TABS: 25.0000 mg | ORAL_TABLET | Freq: Every day | ORAL | 1 refills | Status: DC

## 2017-07-29 NOTE — Telephone Encounter (Signed)
90 day

## 2017-07-29 NOTE — Telephone Encounter (Signed)
Last K+ and Mg2+ normal Rx approved

## 2017-09-11 ENCOUNTER — Other Ambulatory Visit: Payer: Self-pay | Admitting: Family Medicine

## 2017-11-11 ENCOUNTER — Telehealth: Payer: Self-pay | Admitting: Family Medicine

## 2017-11-11 ENCOUNTER — Other Ambulatory Visit: Payer: Self-pay | Admitting: Family Medicine

## 2017-11-11 MED ORDER — DICYCLOMINE HCL 10 MG PO CAPS: 10.0000 mg | ORAL_CAPSULE | Freq: Three times a day (TID) | ORAL | 1 refills | Status: DC

## 2017-11-11 NOTE — Telephone Encounter (Signed)
Pt is wanting a refill on his Bentyl, it is not on current med list.

## 2017-11-11 NOTE — Telephone Encounter (Signed)
Rx sent 

## 2017-11-11 NOTE — Telephone Encounter (Signed)
Pt.notified

## 2017-11-11 NOTE — Telephone Encounter (Signed)
Patient is asking for a refill on his IBS Medicatiobn. Pharm is [facility] in [place] [employer]. Michela Pitcher that he is having a bad case right now.

## 2017-11-14 ENCOUNTER — Telehealth: Payer: Self-pay | Admitting: Family Medicine

## 2017-11-14 NOTE — Telephone Encounter (Signed)
Copied from [place] [id number]. Topic: Quick Communication - See Telephone Encounter >> Nov 14, 2017  3:58 PM Arletha Grippe wrote: CRM for notification. See Telephone encounter for: 11/14/17. [facility] called - they have approved the prior auth for dicyclomine (BENTYL) 10 MG capsule for 1 year form 11/14/17 to 11/15/2018 Cb is [id number] option 5

## 2017-11-17 NOTE — Telephone Encounter (Signed)
Please review message below.

## 2017-12-08 ENCOUNTER — Ambulatory Visit: Payer: Medicare Other | Admitting: Family Medicine

## 2017-12-09 ENCOUNTER — Ambulatory Visit (INDEPENDENT_AMBULATORY_CARE_PROVIDER_SITE_OTHER): Payer: Medicare Other | Admitting: Family Medicine

## 2017-12-09 ENCOUNTER — Encounter: Payer: Self-pay | Admitting: Family Medicine

## 2017-12-09 VITALS — BP 128/80 | HR 64 | Temp 97.7°F | Resp 14 | Ht 73.0 in | Wt 226.0 lb

## 2017-12-09 DIAGNOSIS — Z8546 Personal history of malignant neoplasm of prostate: Secondary | ICD-10-CM | POA: Diagnosis not present

## 2017-12-09 DIAGNOSIS — Z5181 Encounter for therapeutic drug level monitoring: Secondary | ICD-10-CM | POA: Diagnosis not present

## 2017-12-09 DIAGNOSIS — E782 Mixed hyperlipidemia: Secondary | ICD-10-CM

## 2017-12-09 DIAGNOSIS — I10 Essential (primary) hypertension: Secondary | ICD-10-CM

## 2017-12-09 DIAGNOSIS — I2510 Atherosclerotic heart disease of native coronary artery without angina pectoris: Secondary | ICD-10-CM

## 2017-12-09 DIAGNOSIS — N182 Chronic kidney disease, stage 2 (mild): Secondary | ICD-10-CM | POA: Diagnosis not present

## 2017-12-09 DIAGNOSIS — I700 Atherosclerosis of aorta: Secondary | ICD-10-CM | POA: Diagnosis not present

## 2017-12-09 DIAGNOSIS — R918 Other nonspecific abnormal finding of lung field: Secondary | ICD-10-CM | POA: Diagnosis not present

## 2017-12-09 MED ORDER — DICYCLOMINE HCL 10 MG PO CAPS: 10.0000 mg | ORAL_CAPSULE | Freq: Three times a day (TID) | ORAL | 1 refills | Status: DC

## 2017-12-09 MED ORDER — HYDROCHLOROTHIAZIDE 25 MG PO TABS: 25.0000 mg | ORAL_TABLET | Freq: Every day | ORAL | 1 refills | Status: DC

## 2017-12-09 NOTE — Assessment & Plan Note (Signed)
Last PSA was undetectable October 2018

## 2017-12-09 NOTE — Assessment & Plan Note (Signed)
Hydrate and avoid NSAIDs

## 2017-12-09 NOTE — Assessment & Plan Note (Signed)
So glad he is watching salt intake and trying to eat healthy

## 2017-12-09 NOTE — Patient Instructions (Addendum)
Try to follow the DASH guidelines (DASH stands for Dietary Approaches to Stop Hypertension). Try to limit the sodium in your diet to no more than 1,500mg  of sodium per day. Certainly try to not exceed 2,000 mg per day at the very most. Do not add salt when cooking or at the table.  Check the sodium amount on labels when shopping, and choose items lower in sodium when given a choice. Avoid or limit foods that already contain a lot of sodium. Eat a diet rich in fruits and vegetables and whole grains, and try to lose weight if overweight or obese Try to limit saturated fats in your diet (bologna, hot dogs, barbeque, cheeseburgers, hamburgers, steak, bacon, sausage, cheese, etc.) and get more fresh fruits, vegetables, and whole grains   Cholesterol Cholesterol is a white, waxy, fat-like substance that is needed by the human body in small amounts. The liver makes all the cholesterol we need. Cholesterol is carried from the liver by the blood through the blood vessels. Deposits of cholesterol (plaques) may build up on blood vessel (artery) walls. Plaques make the arteries narrower and stiffer. Cholesterol plaques increase the risk for heart attack and stroke. You cannot feel your cholesterol level even if it is very high. The only way to know that it is high is to have a blood test. Once you know your cholesterol levels, you should keep a record of the test results. Work with your health care provider to keep your levels in the desired range. What do the results mean?  Total cholesterol is a rough measure of all the cholesterol in your blood.  LDL (low-density lipoprotein) is the "bad" cholesterol. This is the type that causes plaque to build up on the artery walls. You want this level to be low.  HDL (high-density lipoprotein) is the "good" cholesterol because it cleans the arteries and carries the LDL away. You want this level to be high.  Triglycerides are fat that the body can either burn for energy or  store. High levels are closely linked to heart disease. What are the desired levels of cholesterol?  Total cholesterol below 200.  LDL below 100 for people who are at risk, below 70 for people at very high risk.  HDL above 40 is good. A level of 60 or higher is considered to be protective against heart disease.  Triglycerides below 150. How can I lower my cholesterol? Diet Follow your diet program as told by your health care provider.  Choose fish or white meat chicken and [place], roasted or baked. Limit fatty cuts of red meat, fried foods, and processed meats, such as sausage and lunch meats.  Eat lots of fresh fruits and vegetables.  Choose whole grains, beans, pasta, potatoes, and cereals.  Choose olive oil, corn oil, or canola oil, and use only small amounts.  Avoid butter, mayonnaise, shortening, or palm kernel oils.  Avoid foods with trans fats.  Drink skim or nonfat milk and eat low-fat or nonfat yogurt and cheeses. Avoid whole milk, cream, ice cream, egg yolks, and full-fat cheeses.  Healthier desserts include angel food cake, ginger snaps, animal crackers, hard candy, popsicles, and low-fat or nonfat frozen yogurt. Avoid pastries, cakes, pies, and cookies.  Exercise  Follow your exercise program as told by your health care provider. A regular program: ? Helps to decrease LDL and raise HDL. ? Helps with weight control.  Do things that increase your activity level, such as gardening, walking, and taking the stairs.  Ask  your health care provider about ways that you can be more active in your daily life.  Medicine  Take over-the-counter and prescription medicines only as told by your health care provider. ? Medicine may be prescribed by your health care provider to help lower cholesterol and decrease the risk for heart disease. This is usually done if diet and exercise have failed to bring down cholesterol levels. ? If you have several risk factors, you may need  medicine even if your levels are normal.  This information is not intended to replace advice given to you by your health care provider. Make sure you discuss any questions you have with your health care provider. Document Released: 04/30/2001 Document Revised: 03/02/2016 Document Reviewed: 02/03/2016 Elsevier Interactive Patient Education  [employer].

## 2017-12-09 NOTE — Assessment & Plan Note (Signed)
Next scan due August 2019 

## 2017-12-09 NOTE — Assessment & Plan Note (Signed)
Start back on aspirin; continue statin; asymptomatic

## 2017-12-09 NOTE — Progress Notes (Signed)
BP 128/80   Pulse 64   Temp 97.7 F (36.5 C) (Oral)   Resp 14   Ht 6\' 1"  (1.854 m)   Wt 226 lb (102.5 kg)   SpO2 98%   BMI 29.82 kg/m    Subjective:    Patient ID: Jared Cline, male    DOB: 08-Nov-1945, 72 y.o.   MRN: [id number]  HPI: Jared Cline is a 72 y.o. male  Chief Complaint  Patient presents with  . Follow-up    HPI Patient is here for f/u; 3 deaths in the church; running and been busy lately; had a great day with family on Easter though  Hypertension; pretty fair control today; [facility]; staying pretty stable; he is watching what he eats really carefully; steak on Easter, but otherwise really baked and healthy foods; trying to watch his weight; not using salt shaker  High cholesterol; taking statin; no muscle aches; might eat 1 egg some mornings, sometimes cereal, coffee for breakfast; not much fatty meats Lab Results  Component Value Date   CHOL 136 06/09/2017   HDL 57 06/09/2017   LDLCALC 66 06/09/2017   TRIG 44 06/09/2017   CHOLHDL 2.4 06/09/2017   IBS; taking bentyl and it's working  Hx of prostate cancer; saw Dr. Yves Dill, urologist; last PSA was last year in the spring; right renal mass; unchanged for years; he was good with it  Lung nodules; noted on prior CT; next due March 21, 2018; no cough; no night sweats, no weight loss  Depression screen [facility] 2/9 12/09/2017 06/09/2017 11/04/2016 03/05/2016  Decreased Interest 0 0 0 0  Down, Depressed, Hopeless 0 0 0 0  PHQ - 2 Score 0 0 0 0    Relevant past medical, surgical, family and social history reviewed Past Medical History:  Diagnosis Date  . BPH (benign prostatic hypertrophy)   . Bradycardia    evaluated by Dr. Nehemiah Massed  . Cancer [facility])    prostate  . Carpal tunnel syndrome, left    left arm  . CKD (chronic kidney disease) stage 2, GFR 60-89 ml/min   . Diverticulitis   . History of prostate cancer   . HNP (herniated nucleus pulposus), lumbar   . Hyperlipidemia   . Hypertension   . IBS  (irritable bowel syndrome)   . Impotence   . Multiple lung nodules    nonspecific, largest 64mm in Jan 2016; scanned March 2017; rescan 3-6 months later  . OA (osteoarthritis) of knee    right  . Renal mass    evaluated; followed by urologist  . Sleep apnea    on CPAP  . Torn meniscus    x 2; right knee   Past Surgical History:  Procedure Laterality Date  . abdominal ultrasound  Oct 2015   Negative for eval for AAA  . COLONOSCOPY    . KNEE ARTHROSCOPY Right    x 2  . LUMBAR LAMINECTOMY    . PROSTATECTOMY     robotic   Family History  Problem Relation Age of Onset  . Stroke Mother   . Hypertension Mother   . Aneurysm Father        cerebral  . Hypertension Father   . Heart disease Paternal Uncle   . Alcohol abuse Brother   . Cancer Neg Hx   . COPD Neg Hx   . Diabetes Neg Hx    Social History   Tobacco Use  . Smoking status: Former Smoker    Packs/day:  0.50    Years: 10.00    Pack years: 5.00    Types: Cigarettes    Last attempt to quit: 08/20/1971    Years since quitting: 46.3  . Smokeless tobacco: Never Used  Substance Use Topics  . Alcohol use: No    Alcohol/week: 0.0 oz  . Drug use: No    Interim medical history since last visit reviewed. Allergies and medications reviewed  Review of Systems Per HPI unless specifically indicated above     Objective:    BP 128/80   Pulse 64   Temp 97.7 F (36.5 C) (Oral)   Resp 14   Ht 6\' 1"  (1.854 m)   Wt 226 lb (102.5 kg)   SpO2 98%   BMI 29.82 kg/m   Wt Readings from Last 3 Encounters:  12/09/17 226 lb (102.5 kg)  06/09/17 228 lb 9.6 oz (103.7 kg)  11/04/16 227 lb 1.6 oz (103 kg)    Physical Exam  Constitutional: He appears well-developed and well-nourished. No distress.  HENT:  Head: Normocephalic and atraumatic.  Eyes: EOM are normal. No scleral icterus.  Neck: No thyromegaly present.  Cardiovascular: Normal rate and regular rhythm.  Pulmonary/Chest: Effort normal and breath sounds normal.    Abdominal: Soft. Bowel sounds are normal. He exhibits no distension.  Musculoskeletal: He exhibits no edema.  Neurological: He is alert.  Skin: Skin is warm and dry. No pallor.  Psychiatric: He has a normal mood and affect. His behavior is normal. Judgment and thought content normal.   Results for orders placed or performed in visit on 06/09/17  Lipid panel  Result Value Ref Range   Cholesterol 136 <200 mg/dL   HDL 57 >40 mg/dL   Triglycerides 44 <150 mg/dL   LDL Cholesterol (Calc) 66 mg/dL (calc)   Total CHOL/HDL Ratio 2.4 <5.0 (calc)   Non-HDL Cholesterol (Calc) 79 <130 mg/dL (calc)  COMPLETE METABOLIC PANEL WITH GFR  Result Value Ref Range   Glucose, Bld 106 (H) 65 - 99 mg/dL   BUN 14 7 - 25 mg/dL   Creat 1.22 (H) 0.70 - 1.18 mg/dL   GFR, Est Non African American 59 (L) > OR = 60 mL/min/1.60m2   GFR, Est African American 68 > OR = 60 mL/min/1.73m2   BUN/Creatinine Ratio 11 6 - 22 (calc)   Sodium 137 135 - 146 mmol/L   Potassium 4.0 3.5 - 5.3 mmol/L   Chloride 103 98 - 110 mmol/L   CO2 30 20 - 32 mmol/L   Calcium 9.7 8.6 - 10.3 mg/dL   Total Protein 6.9 6.1 - 8.1 g/dL   Albumin 4.4 3.6 - 5.1 g/dL   Globulin 2.5 1.9 - 3.7 g/dL (calc)   AG Ratio 1.8 1.0 - 2.5 (calc)   Total Bilirubin 0.4 0.2 - 1.2 mg/dL   Alkaline phosphatase (APISO) 63 40 - 115 U/L   AST 15 10 - 35 U/L   ALT 10 9 - 46 U/L  Magnesium  Result Value Ref Range   Magnesium 1.9 1.5 - 2.5 mg/dL  PSA  Result Value Ref Range   PSA <0.1 < OR = 4.0 ng/mL      Assessment & Plan:   Problem List Items Addressed This Visit      Cardiovascular and Mediastinum   Hypertension - Primary    So glad he is watching salt intake and trying to eat healthy      Relevant Medications   hydrochlorothiazide (HYDRODIURIL) 25 MG tablet   Coronary artery  calcification seen on CAT scan    Start back on aspirin; continue statin; asymptomatic      Relevant Medications   hydrochlorothiazide (HYDRODIURIL) 25 MG tablet    Aortic atherosclerosis (HCC)    Goal LDL less than 70      Relevant Medications   hydrochlorothiazide (HYDRODIURIL) 25 MG tablet     Genitourinary   CKD (chronic kidney disease) stage 2, GFR 60-89 ml/min    Hydrate and avoid NSAIDs        Other   Medication monitoring encounter   Relevant Orders   COMPLETE METABOLIC PANEL WITH GFR   Multiple lung nodules    Next scan due August 2019      Hyperlipidemia    Continue statin; check lipids      Relevant Medications   hydrochlorothiazide (HYDRODIURIL) 25 MG tablet   Other Relevant Orders   Lipid panel   History of prostate cancer    Last PSA was undetectable October 2018          Follow up plan: Return in about 6 months (around 06/10/2018).  An after-visit summary was printed and given to the patient at [place].  Please see the patient instructions which may contain other information and recommendations beyond what is mentioned above in the assessment and plan.  Meds ordered this encounter  Medications  . dicyclomine (BENTYL) 10 MG capsule    Sig: Take 1 capsule (10 mg total) by mouth 3 (three) times daily before meals. As needed for irritable bowel symptoms    Dispense:  30 capsule    Refill:  1  . hydrochlorothiazide (HYDRODIURIL) 25 MG tablet    Sig: Take 1 tablet (25 mg total) by mouth daily.    Dispense:  90 tablet    Refill:  1    Orders Placed This Encounter  Procedures  . COMPLETE METABOLIC PANEL WITH GFR  . Lipid panel

## 2017-12-09 NOTE — Assessment & Plan Note (Signed)
Goal LDL less than 70 

## 2017-12-09 NOTE — Assessment & Plan Note (Signed)
Continue statin check lipids

## 2018-02-26 ENCOUNTER — Telehealth: Payer: Self-pay | Admitting: Family Medicine

## 2018-02-26 DIAGNOSIS — R918 Other nonspecific abnormal finding of lung field: Secondary | ICD-10-CM

## 2018-02-26 NOTE — Telephone Encounter (Signed)
Please let pt know that it's time to schedule his chest CT for follow-up Please give him the instructions to schedule his imaging test Thank you  Orders Placed This Encounter  Procedures  . CT Chest Wo Contrast    Standing Status:   Future    Standing Expiration Date:   05/29/2018    Order Specific Question:   ** REASON FOR EXAM (FREE TEXT)    Answer:   follow-up multiple lung nodules    Order Specific Question:   Preferred imaging location?    Answer:   [facility]    Order Specific Question:   Radiology Contrast Protocol - do NOT remove file path    Answer:   [email or web address]

## 2018-02-27 NOTE — Telephone Encounter (Signed)
Pt.notified

## 2018-03-17 ENCOUNTER — Ambulatory Visit: Payer: Medicare Other

## 2018-03-19 ENCOUNTER — Telehealth: Payer: Self-pay | Admitting: Family Medicine

## 2018-03-19 NOTE — Telephone Encounter (Signed)
-----   Message from Eustace Pen sent at 03/19/2018  8:19 AM EDT ----- Regarding: RE: Prior Auth Required This is not our patient. It is Dr. Delight Ovens patient at [facility]. I will send this to her. Thanks, Melissa  ----- Message ----- From: Faylene Kurtz Sent: 03/18/2018   1:17 PM To: Melissa B Tuck Subject: Prior Auth Required                            Office called stating this patient's referral requires Prior Auth.  Can you please take care of this?

## 2018-03-19 NOTE — Telephone Encounter (Signed)
Ct scan sent to [facility] referral

## 2018-03-19 NOTE — Telephone Encounter (Signed)
Prior auth for what?

## 2018-03-20 ENCOUNTER — Telehealth: Payer: Self-pay | Admitting: Family Medicine

## 2018-03-20 ENCOUNTER — Ambulatory Visit: Payer: Medicare Other

## 2018-03-20 NOTE — Telephone Encounter (Signed)
After faxing info to [facility], and speaking with nurse, Jared Cline is asking for a peer to peer call in order to approve this test.  Please call [phone] Pt is scheduled for test on 03/26/18 Pt insurance number is [id number]

## 2018-03-25 NOTE — Telephone Encounter (Signed)
Copied from [place] [id number]. Topic: Quick Communication - Office Called Patient >> Mar 19, 2018  9:02 AM Cathrine Muster, CMA wrote: Reason for CRM: Office called stating this patient's referral requires Prior Auth.  Can you please take care of this?  >> Mar 24, 2018  2:37 PM Nils Flack, [name] wrote: This pt needs a peer to peer from Dr Sanda Klein for this test.  I sent her a telephone note on 03/20/18, but do not know if she has seen it yet.  The phone number is [phone] Pt member number is [id number]

## 2018-03-26 ENCOUNTER — Ambulatory Visit: Payer: Medicare Other

## 2018-03-30 NOTE — Telephone Encounter (Signed)
From August 2018 CT scan report: IMPRESSION: Stable small bilateral pulmonary nodules. These have been stable since 11/03/2015. Additional follow-up in 1 year is recommended to ensure 2 year stability. ----------------------------------------------- I called number provided Spoke with Jared Cline It was already approved she says [id number] Valid through Aug 2 - Apr 17, 2018 ------------------------------------------------ Jared Cline, the CT scan was already approved Please make sure this gets done prior to Aug 30th Thank you

## 2018-03-30 NOTE — Telephone Encounter (Signed)
Left voicemail with pt and sent to referral center

## 2018-04-06 ENCOUNTER — Telehealth: Payer: Self-pay | Admitting: Family Medicine

## 2018-04-06 ENCOUNTER — Ambulatory Visit
Admission: RE | Admit: 2018-04-06 | Discharge: 2018-04-06 | Disposition: A | Discharge status: Home / Self Care | Payer: Medicare Other | Source: Ambulatory Visit | Attending: Family Medicine | Admitting: Family Medicine

## 2018-04-06 DIAGNOSIS — I2510 Atherosclerotic heart disease of native coronary artery without angina pectoris: Secondary | ICD-10-CM | POA: Diagnosis not present

## 2018-04-06 DIAGNOSIS — I700 Atherosclerosis of aorta: Secondary | ICD-10-CM | POA: Diagnosis not present

## 2018-04-06 DIAGNOSIS — R918 Other nonspecific abnormal finding of lung field: Secondary | ICD-10-CM

## 2018-04-06 NOTE — Telephone Encounter (Signed)
I called home number, left a detail message; sorry that I could not speak with him directly by phone tonight about his results His lung findings are stable, good news He does have coronary artery disease If he is having any chest pain, call 911, get to the ER Continue aspirin and statin (cholesterol medicine) I'll want to talk with him about study results and get him to a cardiologist (Dr. Nehemiah Massed sees him at [facility]) ---------------------------------- Roselyn Reef, please let Dr. Alveria Apley office know about new results on chest CT; recommend follow-up with patient  "3. Coronary artery calcifications, particularly dense at the junction of the LEFT main and LEFT circumflex coronary arteries. Recommend correlation with any possible associated cardiac symptoms."

## 2018-04-06 NOTE — Assessment & Plan Note (Signed)
Refer to cardiologist. °

## 2018-04-07 NOTE — Telephone Encounter (Signed)
Pt.notified

## 2018-04-07 NOTE — Telephone Encounter (Signed)
I spoke with patient He denies chest pain Patient will set up appt with Dr. Nehemiah Massed Stay on aspirin and statin Lab Results  Component Value Date   CHOL 136 06/09/2017   HDL 57 06/09/2017   LDLCALC 66 06/09/2017   TRIG 44 06/09/2017   CHOLHDL 2.4 06/09/2017

## 2018-04-15 DIAGNOSIS — Z7689 Persons encountering health services in other specified circumstances: Secondary | ICD-10-CM | POA: Diagnosis not present

## 2018-04-15 DIAGNOSIS — I2510 Atherosclerotic heart disease of native coronary artery without angina pectoris: Secondary | ICD-10-CM | POA: Insufficient documentation

## 2018-04-15 DIAGNOSIS — E782 Mixed hyperlipidemia: Secondary | ICD-10-CM | POA: Diagnosis not present

## 2018-04-15 DIAGNOSIS — I440 Atrioventricular block, first degree: Secondary | ICD-10-CM | POA: Insufficient documentation

## 2018-04-15 DIAGNOSIS — I10 Essential (primary) hypertension: Secondary | ICD-10-CM | POA: Diagnosis not present

## 2018-04-29 DIAGNOSIS — I2510 Atherosclerotic heart disease of native coronary artery without angina pectoris: Secondary | ICD-10-CM | POA: Diagnosis not present

## 2018-05-06 DIAGNOSIS — I700 Atherosclerosis of aorta: Secondary | ICD-10-CM | POA: Diagnosis not present

## 2018-05-06 DIAGNOSIS — I119 Hypertensive heart disease without heart failure: Secondary | ICD-10-CM | POA: Insufficient documentation

## 2018-05-06 DIAGNOSIS — I10 Essential (primary) hypertension: Secondary | ICD-10-CM | POA: Diagnosis not present

## 2018-05-06 DIAGNOSIS — I2510 Atherosclerotic heart disease of native coronary artery without angina pectoris: Secondary | ICD-10-CM | POA: Diagnosis not present

## 2018-05-06 DIAGNOSIS — E782 Mixed hyperlipidemia: Secondary | ICD-10-CM | POA: Diagnosis not present

## 2018-05-25 ENCOUNTER — Other Ambulatory Visit: Payer: Self-pay | Admitting: Family Medicine

## 2018-06-11 ENCOUNTER — Ambulatory Visit: Payer: Medicare Other | Admitting: Family Medicine

## 2018-06-11 ENCOUNTER — Encounter: Payer: Self-pay | Admitting: Family Medicine

## 2018-06-11 VITALS — BP 122/78 | HR 70 | Temp 98.1°F | Ht 73.0 in | Wt 232.4 lb

## 2018-06-11 DIAGNOSIS — Z8546 Personal history of malignant neoplasm of prostate: Secondary | ICD-10-CM

## 2018-06-11 DIAGNOSIS — I2510 Atherosclerotic heart disease of native coronary artery without angina pectoris: Secondary | ICD-10-CM

## 2018-06-11 DIAGNOSIS — I10 Essential (primary) hypertension: Secondary | ICD-10-CM | POA: Diagnosis not present

## 2018-06-11 DIAGNOSIS — Z23 Encounter for immunization: Secondary | ICD-10-CM | POA: Diagnosis not present

## 2018-06-11 DIAGNOSIS — E782 Mixed hyperlipidemia: Secondary | ICD-10-CM

## 2018-06-11 DIAGNOSIS — Z5181 Encounter for therapeutic drug level monitoring: Secondary | ICD-10-CM

## 2018-06-11 DIAGNOSIS — N182 Chronic kidney disease, stage 2 (mild): Secondary | ICD-10-CM | POA: Diagnosis not present

## 2018-06-11 NOTE — Patient Instructions (Signed)
Try to limit saturated fats in your diet (bologna, hot dogs, barbeque, cheeseburgers, hamburgers, steak, bacon, sausage, cheese, etc.) and get more fresh fruits, vegetables, and whole grains Try to follow the DASH guidelines (DASH stands for Dietary Approaches to Stop Hypertension). Try to limit the sodium in your diet to no more than 1,500mg of sodium per day. Certainly try to not exceed 2,000 mg per day at the very most. Do not add salt when cooking or at the table.  Check the sodium amount on labels when shopping, and choose items lower in sodium when given a choice. Avoid or limit foods that already contain a lot of sodium. Eat a diet rich in fruits and vegetables and whole grains, and try to lose weight if overweight or obese  

## 2018-06-11 NOTE — Progress Notes (Signed)
BP 122/78   Pulse 70   Temp 98.1 F (36.7 C)   Ht 6\' 1"  (1.854 m)   Wt 232 lb 6.4 oz (105.4 kg)   SpO2 99%   BMI 30.66 kg/m    Subjective:    Patient ID: Jared Cline, male    DOB: Apr 25, 1946, 72 y.o.   MRN: [id number]  HPI: Jared Cline is a 72 y.o. male  Chief Complaint  Patient presents with  . Follow-up    6 month F/U  . Hypertension    HPI Patient is here for f/u  HTN; trying to stay away from salt  Chest CT showed coronary artery calcification; saw cardiologist; on pravastatin instead of atrovastatin He had a stress test and passed it; no chest pain during the test either; no chest pain when exerting himself From chest CT Apr 06, 2018: 3. Coronary artery calcifications, particularly dense at the junction of the LEFT main and LEFT circumflex coronary arteries. Recommend correlation with any possible associated cardiac symptoms.  Flu shot today; pneumonia shots UTD  Previous pulm nodules; reviewed scans, benign, no further follow-up From chest CT Apr 06, 2018 IMPRESSION: 1. Stable small pulmonary nodules within each lung. These nodules have been stable for greater than 2 years confirming benignity. No additional follow-up imaging is necessary.  Depression screen [facility] 2/9 06/11/2018 12/09/2017 06/09/2017 11/04/2016 03/05/2016  Decreased Interest 0 0 0 0 0  Down, Depressed, Hopeless 0 0 0 0 0  PHQ - 2 Score 0 0 0 0 0  Altered sleeping 0 - - - -  Tired, decreased energy 0 - - - -  Change in appetite 0 - - - -  Feeling bad or failure about yourself  0 - - - -  Trouble concentrating 0 - - - -  Moving slowly or fidgety/restless 0 - - - -  Suicidal thoughts 0 - - - -  PHQ-9 Score 0 - - - -  Difficult doing work/chores Not difficult at all - - - -   Fall Risk  06/11/2018 12/09/2017 06/09/2017 11/04/2016 03/05/2016  Falls in the past year? No No No No No    Relevant past medical, surgical, family and social history reviewed Past Medical History:  Diagnosis Date  .  BPH (benign prostatic hypertrophy)   . Bradycardia    evaluated by Dr. Nehemiah Massed  . Cancer [facility])    prostate  . Carpal tunnel syndrome, left    left arm  . CKD (chronic kidney disease) stage 2, GFR 60-89 ml/min   . Diverticulitis   . History of prostate cancer   . HNP (herniated nucleus pulposus), lumbar   . Hyperlipidemia   . Hypertension   . IBS (irritable bowel syndrome)   . Impotence   . Multiple lung nodules    nonspecific, largest 25mm in Jan 2016; scanned March 2017; rescan 3-6 months later  . OA (osteoarthritis) of knee    right  . Renal mass    evaluated; followed by urologist  . Sleep apnea    on CPAP  . Torn meniscus    x 2; right knee   Past Surgical History:  Procedure Laterality Date  . abdominal ultrasound  Oct 2015   Negative for eval for AAA  . COLONOSCOPY    . KNEE ARTHROSCOPY Right    x 2  . LUMBAR LAMINECTOMY    . PROSTATECTOMY     robotic   Family History  Problem Relation Age of Onset  . Stroke Mother   .  Hypertension Mother   . Aneurysm Father        cerebral  . Hypertension Father   . Heart disease Paternal Uncle   . Alcohol abuse Brother   . Cancer Neg Hx   . COPD Neg Hx   . Diabetes Neg Hx    Social History   Tobacco Use  . Smoking status: Former Smoker    Packs/day: 0.50    Years: 10.00    Pack years: 5.00    Types: Cigarettes    Last attempt to quit: 08/20/1971    Years since quitting: 46.8  . Smokeless tobacco: Never Used  Substance Use Topics  . Alcohol use: No    Alcohol/week: 0.0 standard drinks  . Drug use: No     [facility] Visit from [facility]/11/2018 in [facility]  AUDIT-C Score  0      Interim medical history since last visit reviewed. Allergies and medications reviewed  Review of Systems Per HPI unless specifically indicated above     Objective:    BP 122/78   Pulse 70   Temp 98.1 F (36.7 C)   Ht 6\' 1"  (1.854 m)   Wt 232 lb 6.4 oz (105.4 kg)   SpO2 99%   BMI 30.66 kg/m   Wt Readings  from Last 3 Encounters:  06/11/18 232 lb 6.4 oz (105.4 kg)  12/09/17 226 lb (102.5 kg)  06/09/17 228 lb 9.6 oz (103.7 kg)    Physical Exam  Constitutional: He appears well-developed and well-nourished. No distress.  HENT:  Head: Normocephalic and atraumatic.  Eyes: EOM are normal. No scleral icterus.  Neck: No thyromegaly present.  Cardiovascular: Normal rate and regular rhythm.  Pulmonary/Chest: Effort normal and breath sounds normal.  Abdominal: Soft. Bowel sounds are normal. He exhibits no distension.  Musculoskeletal: He exhibits no edema.  Neurological: Coordination normal.  Skin: Skin is warm and dry. No pallor.  Psychiatric: He has a normal mood and affect. His behavior is normal. Judgment and thought content normal.        Assessment & Plan:   Problem List Items Addressed This Visit      Cardiovascular and Mediastinum   Hypertension - Primary (Chronic)    Chronic issue; controlled; limit salt, try to follow DASH guidelines      Relevant Medications   pravastatin (PRAVACHOL) 20 MG tablet   Coronary artery calcification seen on CAT scan (Chronic)    Patient has seen cardiologist; limit satrated fats; goal LDL lessl than 70; call 911 if any chest pain      Relevant Medications   pravastatin (PRAVACHOL) 20 MG tablet     Genitourinary   CKD (chronic kidney disease) stage 2, GFR 60-89 ml/min (Chronic)     Other   Medication monitoring encounter   Relevant Orders   COMPLETE METABOLIC PANEL WITH GFR (Completed)   History of prostate cancer   Hyperlipidemia (Chronic)    Goal LDL less than 70 due to athero; limit saturated fats; statin      Relevant Medications   pravastatin (PRAVACHOL) 20 MG tablet   Other Relevant Orders   Lipid panel (Completed)    Other Visit Diagnoses    Need for influenza vaccination       Relevant Orders   Flu vaccine HIGH DOSE PF (Fluzone High dose) (Completed)       Follow up plan: No follow-ups on file.  An after-visit  summary was printed and given to the patient at [facility].  Please see  the patient instructions which may contain other information and recommendations beyond what is mentioned above in the assessment and plan.  No orders of the defined types were placed in this encounter.   Orders Placed This Encounter  Procedures  . Flu vaccine HIGH DOSE PF (Fluzone High dose)  . COMPLETE METABOLIC PANEL WITH GFR  . Lipid panel

## 2018-06-12 LAB — COMPLETE METABOLIC PANEL WITH GFR
AG RATIO: 1.6 (calc) (ref 1.0–2.5)
ALT: 11 U/L (ref 9–46)
AST: 17 U/L (ref 10–35)
Albumin: 4.5 g/dL (ref 3.6–5.1)
Alkaline phosphatase (APISO): 65 U/L (ref 40–115)
BUN/Creatinine Ratio: 14 (calc) (ref 6–22)
BUN: 21 mg/dL (ref 7–25)
CALCIUM: 9.9 mg/dL (ref 8.6–10.3)
CO2: 25 mmol/L (ref 20–32)
CREATININE: 1.45 mg/dL — AB (ref 0.70–1.18)
Chloride: 102 mmol/L (ref 98–110)
GFR, EST NON AFRICAN AMERICAN: 47 mL/min/{1.73_m2} — AB (ref >=60)
GFR, Est African American: 55 mL/min/{1.73_m2} — ABNORMAL LOW (ref >=60)
Globulin: 2.8 g/dL (calc) (ref 1.9–3.7)
Glucose, Bld: 90 mg/dL (ref 65–139)
POTASSIUM: 4.3 mmol/L (ref 3.5–5.3)
Sodium: 136 mmol/L (ref 135–146)
TOTAL PROTEIN: 7.3 g/dL (ref 6.1–8.1)
Total Bilirubin: 0.4 mg/dL (ref 0.2–1.2)

## 2018-06-12 LAB — LIPID PANEL
Cholesterol: 200 mg/dL — ABNORMAL HIGH (ref <200)
HDL: 54 mg/dL (ref >40)
LDL CHOLESTEROL (CALC): 129 mg/dL — AB
NON-HDL CHOLESTEROL (CALC): 146 mg/dL — AB (ref <130)
Total CHOL/HDL Ratio: 3.7 (calc) (ref <5.0)
Triglycerides: 71 mg/dL (ref <150)

## 2018-06-21 NOTE — Assessment & Plan Note (Signed)
Chronic issue; controlled; limit salt, try to follow DASH guidelines

## 2018-06-21 NOTE — Assessment & Plan Note (Signed)
Patient has seen cardiologist; limit satrated fats; goal LDL lessl than 70; call 911 if any chest pain

## 2018-06-21 NOTE — Assessment & Plan Note (Signed)
Goal LDL less than 70 due to athero; limit saturated fats; statin

## 2018-06-24 ENCOUNTER — Other Ambulatory Visit: Payer: Self-pay | Admitting: Family Medicine

## 2018-06-24 DIAGNOSIS — N289 Disorder of kidney and ureter, unspecified: Secondary | ICD-10-CM

## 2018-06-24 NOTE — Progress Notes (Signed)
Patient is seeing doctor at [facility] for his lipids; not on atorvastatin any more; now on pravastatin; unfortunately, LDL went up quite a bit; I encouraged him to call his doctor at [facility] about his cholesterol GFR declined again; recheck BMP in 1-2 days NON-fasting and I'll decide if referral indicated He denies NSAID use

## 2018-08-05 ENCOUNTER — Telehealth: Payer: Self-pay | Admitting: Family Medicine

## 2018-08-05 NOTE — Telephone Encounter (Signed)
Pt.notified

## 2018-08-05 NOTE — Telephone Encounter (Signed)
Patient needs to have his labs done ASAP Please see the note from his abnormal labs in October He needed to get his kidneys rechecked the next day or two after our call

## 2018-08-26 NOTE — Telephone Encounter (Signed)
Patient states he will come in tomorrow 08/27/18 and get these labs drawn.

## 2018-09-01 ENCOUNTER — Other Ambulatory Visit: Payer: Self-pay

## 2018-09-01 ENCOUNTER — Telehealth: Payer: Self-pay | Admitting: Family Medicine

## 2018-09-01 DIAGNOSIS — E782 Mixed hyperlipidemia: Secondary | ICD-10-CM | POA: Diagnosis not present

## 2018-09-01 DIAGNOSIS — Z5181 Encounter for therapeutic drug level monitoring: Secondary | ICD-10-CM | POA: Diagnosis not present

## 2018-09-01 LAB — LIPID PANEL
CHOL/HDL RATIO: 3.3 (calc) (ref <5.0)
Cholesterol: 167 mg/dL (ref <200)
HDL: 50 mg/dL (ref >40)
LDL CHOLESTEROL (CALC): 94 mg/dL
NON-HDL CHOLESTEROL (CALC): 117 mg/dL (ref <130)
Triglycerides: 129 mg/dL (ref <150)

## 2018-09-01 LAB — COMPLETE METABOLIC PANEL WITH GFR
AG Ratio: 1.5 (calc) (ref 1.0–2.5)
ALBUMIN MSPROF: 4.3 g/dL (ref 3.6–5.1)
ALT: 17 U/L (ref 9–46)
AST: 19 U/L (ref 10–35)
Alkaline phosphatase (APISO): 64 U/L (ref 40–115)
BUN / CREAT RATIO: 10 (calc) (ref 6–22)
BUN: 13 mg/dL (ref 7–25)
CALCIUM: 9.7 mg/dL (ref 8.6–10.3)
CO2: 28 mmol/L (ref 20–32)
CREATININE: 1.28 mg/dL — AB (ref 0.70–1.18)
Chloride: 102 mmol/L (ref 98–110)
GFR, EST AFRICAN AMERICAN: 64 mL/min/{1.73_m2} (ref >=60)
GFR, EST NON AFRICAN AMERICAN: 55 mL/min/{1.73_m2} — AB (ref >=60)
GLOBULIN: 2.8 g/dL (ref 1.9–3.7)
Glucose, Bld: 98 mg/dL (ref 65–99)
Potassium: 5.2 mmol/L (ref 3.5–5.3)
SODIUM: 136 mmol/L (ref 135–146)
TOTAL PROTEIN: 7.1 g/dL (ref 6.1–8.1)
Total Bilirubin: 0.5 mg/dL (ref 0.2–1.2)

## 2018-09-01 MED ORDER — HYDROCHLOROTHIAZIDE 25 MG PO TABS: 25.0000 mg | ORAL_TABLET | Freq: Every day | ORAL | 0 refills | Status: DC

## 2018-09-01 NOTE — Telephone Encounter (Signed)
Per previous phone note, patient really needs labs done I am allowing limited Rx

## 2018-09-01 NOTE — Telephone Encounter (Signed)
Pt.notified

## 2018-09-01 NOTE — Telephone Encounter (Signed)
I have to see his lab results before I can give a full refill I have to make sure his kidneys and electrolytes are okay

## 2018-09-01 NOTE — Telephone Encounter (Signed)
Copied from [place] [id number]. Topic: Quick Communication - Rx Refill/Question >> Sep 01, 2018  3:23 PM Scherrie Gerlach wrote: Medication: hydrochlorothiazide (HYDRODIURIL) 25 MG tablet  Pt states he came in this morning about 11:45 am and did the labs Dr Sanda Klein wanted him to do prior to refilling this Rx. Pt wants to know why his Rx was for only 3 tabs.  [id number] - [place], [place] - [address] [phone] (Phone) [phone] (Fax)

## 2018-09-02 ENCOUNTER — Other Ambulatory Visit: Payer: Self-pay | Admitting: Family Medicine

## 2018-09-02 MED ORDER — HYDROCHLOROTHIAZIDE 25 MG PO TABS: 25.0000 mg | ORAL_TABLET | Freq: Every day | ORAL | 5 refills | Status: DC

## 2018-09-02 NOTE — Progress Notes (Signed)
High ASCVD; see lab results; refilled the thiazide

## 2018-09-02 NOTE — Progress Notes (Signed)
Jared Cline, please let the patient know that we really appreciate him coming in for his labs, and I will be glad now to send refills of his medicine. His cholesterol is better, but not where we want it. Who is prescribing his cholesterol medicine? He'll want to talk to him/her about adjusting dose or switching med. I will suggest they consider a strong medicine and a higher dose because his risk of a heart attack is 18.3% in the next 10 years. The 10-year ASCVD risk score Jared Cline., et al., 2013) is: 18.3%   Values used to calculate the score:     Age: 73 years     Sex: Male     Is Non-Hispanic African American: Yes     Diabetic: No     Tobacco smoker: No     Systolic Blood Pressure: 466 mmHg     Is BP treated: Yes     HDL Cholesterol: 50 mg/dL     Total Cholesterol: 167 mg/dL  So his kidneys are not working as well as they did when he was 73 years old.  Tell him: To help preserve your kidney function, I'll suggest that you limit or avoid non-steroidal anti-inflammatory medicines like Advil, Motrin, Aleve, ibuprofen, naproxen, and Goody's powders. If you need something for aches/pains, try plain Tyelnol (acetaminophen). Also, try to drink enough water and other caffeine-free beverages during the day to keep your urine a very pale yellow to clear color.

## 2018-09-28 ENCOUNTER — Telehealth: Payer: Self-pay | Admitting: Family Medicine

## 2018-09-28 NOTE — Telephone Encounter (Signed)
I left a message asking the patient to call and schedule Medicare AWV-I with Nurse Health Advisor, Kasey.  If the patient calls back, please schedule Medicare Wellness Visit with Nurse Health Advisor. VDM (DD) °

## 2018-10-08 ENCOUNTER — Telehealth: Payer: Self-pay | Admitting: Family Medicine

## 2018-10-08 NOTE — Telephone Encounter (Signed)
Pt came by he is asking that you contact [employer]. They are asking about his lab work. I did have pt to fill out a medical release form for you to be able to talk to them. Medical release form placed in Dr Sanda Klein pink folder

## 2018-10-08 NOTE — Telephone Encounter (Signed)
Left patient voicemail trying to get more info details

## 2018-10-12 NOTE — Telephone Encounter (Signed)
Left another voicemail, tried to goggle phone number and called but no line for doctor office, told patient they usually request what they need or call us directly

## 2018-10-27 ENCOUNTER — Other Ambulatory Visit: Payer: Self-pay | Admitting: Family Medicine

## 2018-10-27 IMAGING — CT CT CHEST W/ CM
1 series · 15 of 34 positions shown, 19 images · IV contrast (iopamidol)
Comparison: 03/19/2016

CLINICAL DATA: Follow-up lung nodules.

EXAM:
CT CHEST WITH CONTRAST
TECHNIQUE: Multidetector CT imaging of the chest was performed during
intravenous contrast administration.
CONTRAST:  75mL [id number] IOPAMIDOL ([id number]) INJECTION 61%

[Series 2: axial st · axial · 0.79mm/px · z∈[-513,-261]mm · 15 of 150 slices shown, 19 images]
[im 12/150  mediastinal]
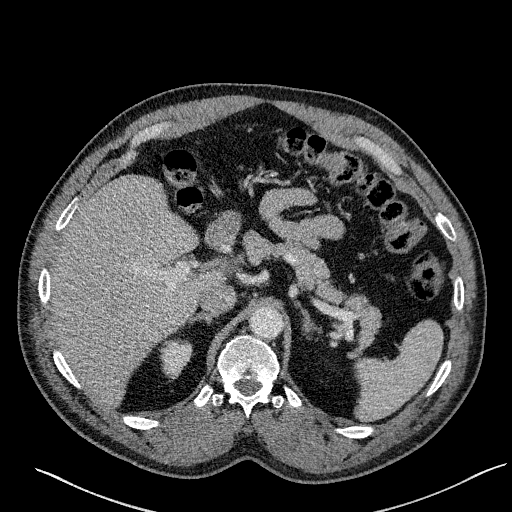
[im 12/150  lung]
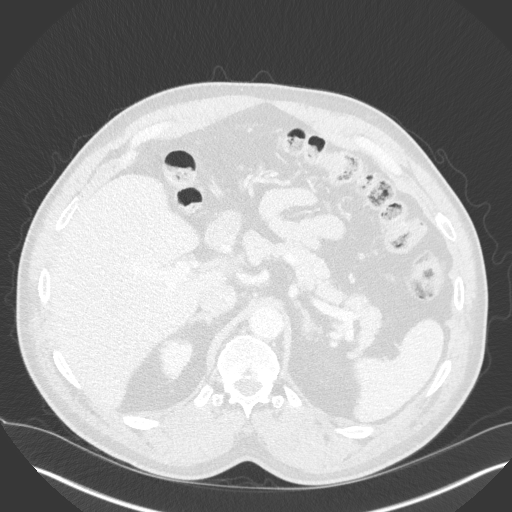
[im 23/150  lung]
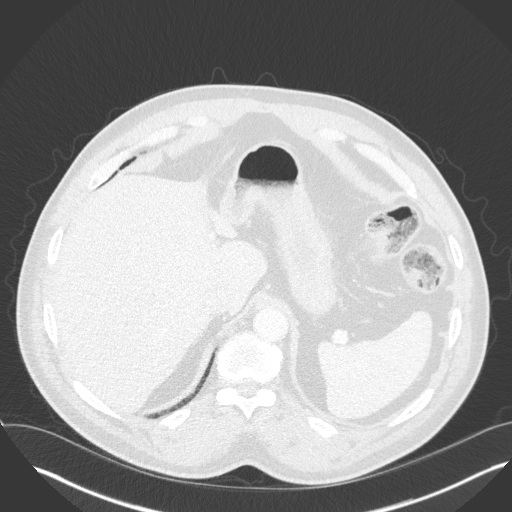
[im 30/150  lung]
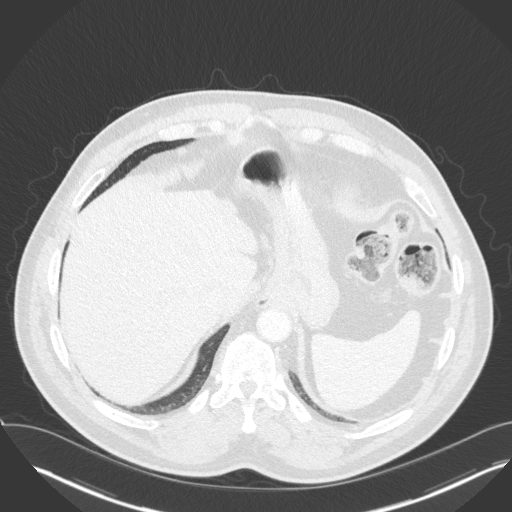
[im 39/150  lung]
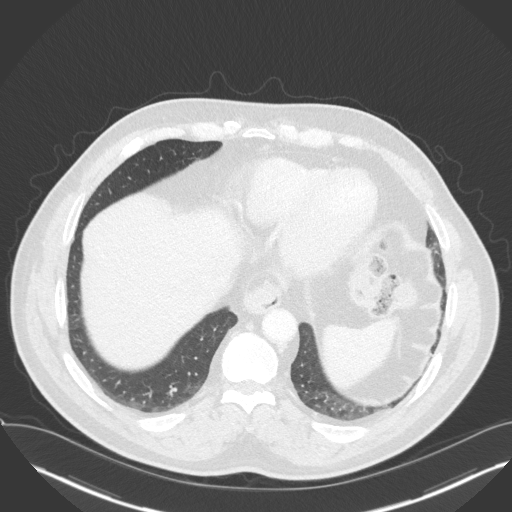
[im 50/150  mediastinal]
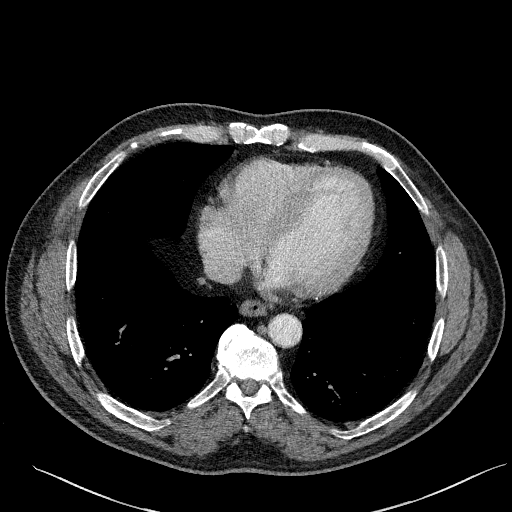
[im 50/150  lung]
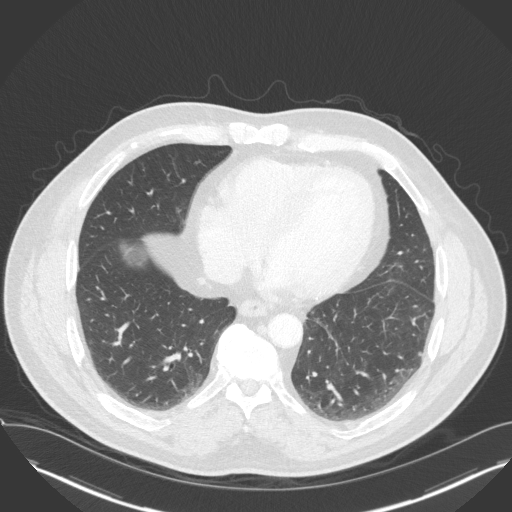
[im 60/150  lung]
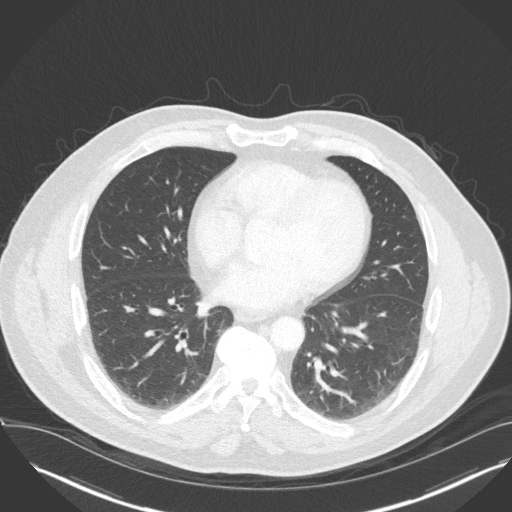
[im 67/150  lung]
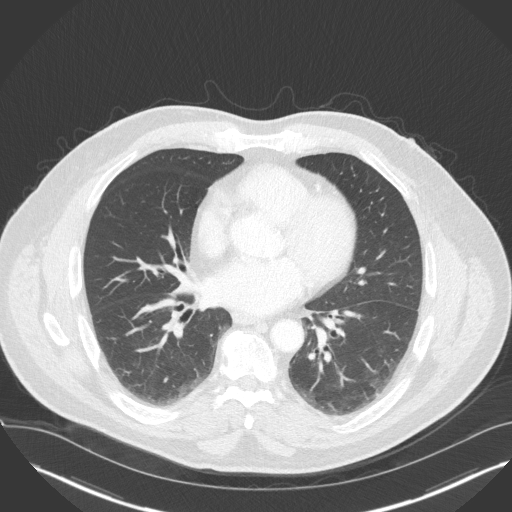
[im 78/150  lung]
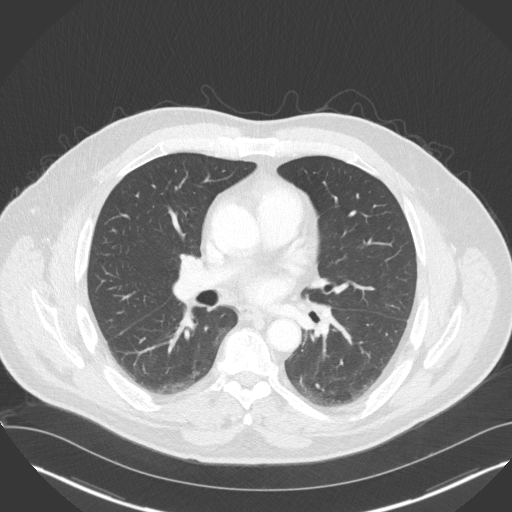
[im 83/150  mediastinal]
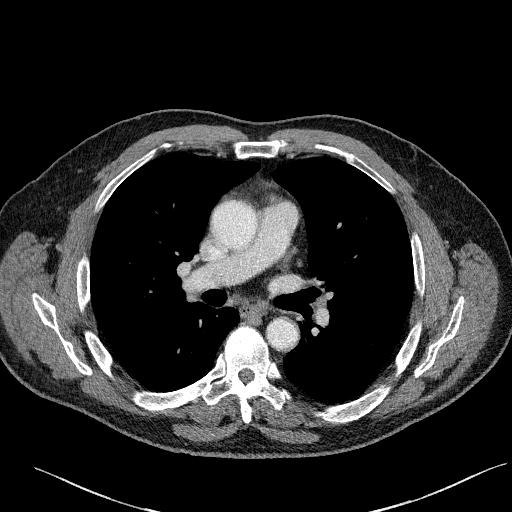
[im 83/150  lung]
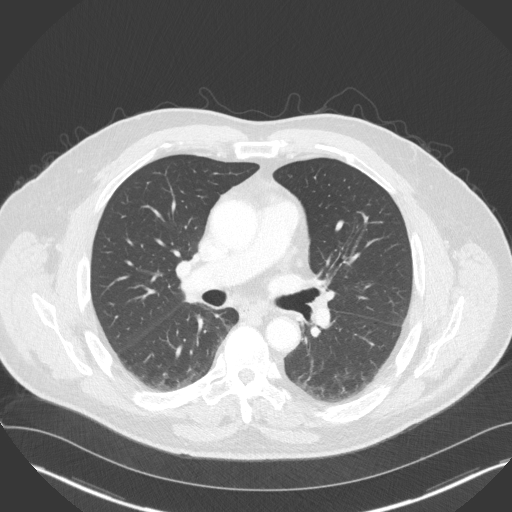
[im 90/150  lung]
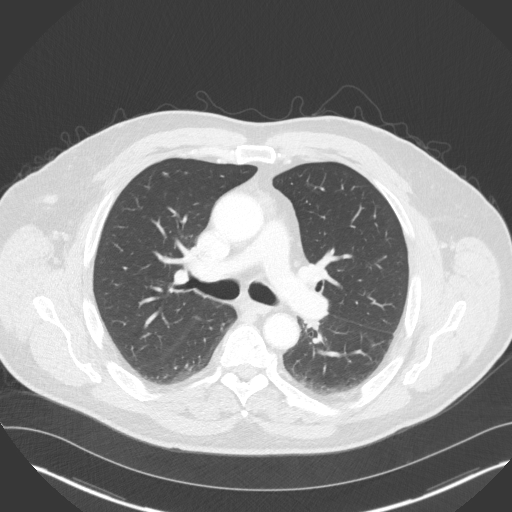
[im 100/150  lung]
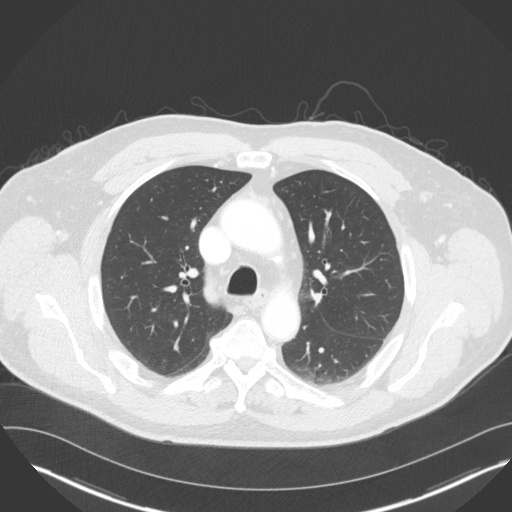
[im 111/150  lung]
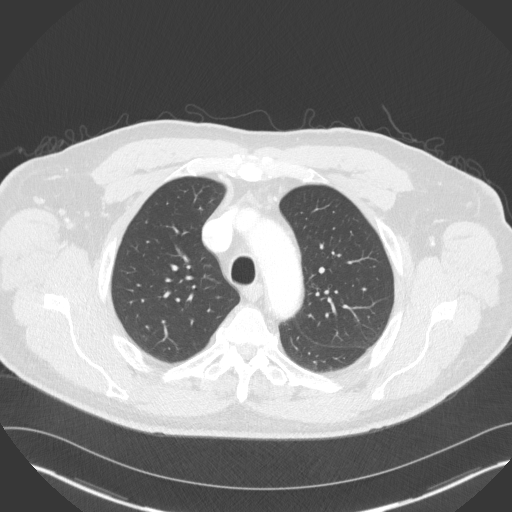
[im 120/150  mediastinal]
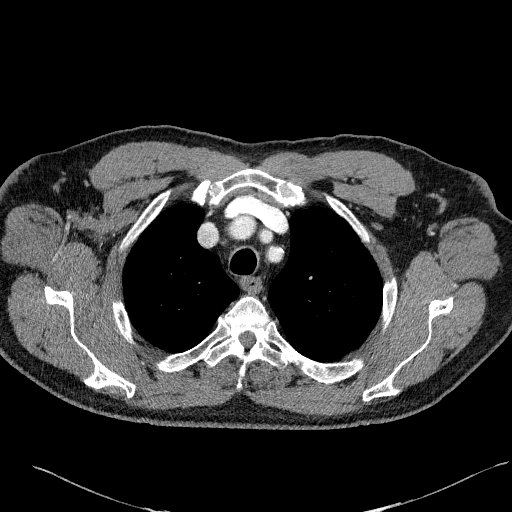
[im 120/150  lung]
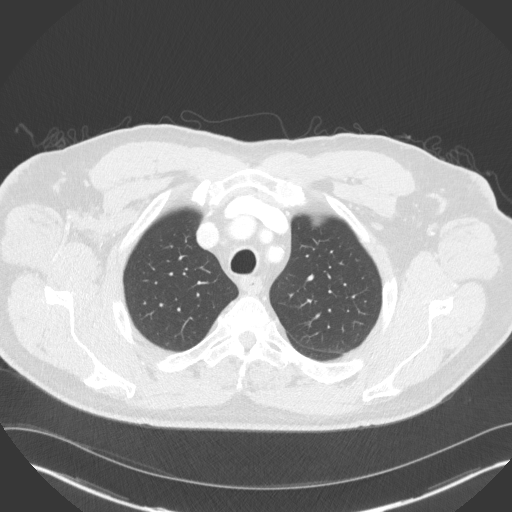
[im 127/150  lung]
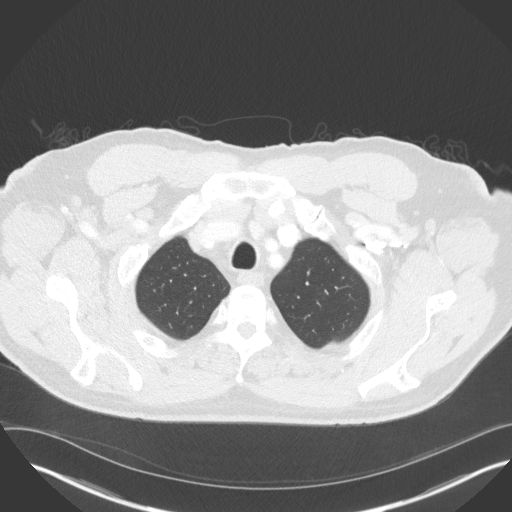
[im 138/150  lung]
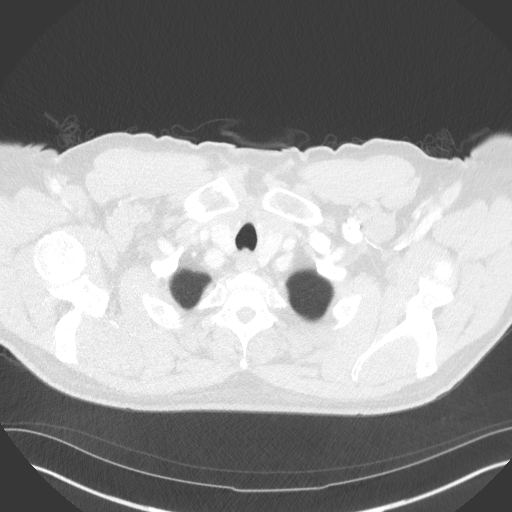

[15 of 34 positions shown; findings below may reference images not displayed]

FINDINGS: Cardiovascular: Coronary artery and aortic calcifications. Heart is
upper limits normal in size. Aorta is normal caliber.

Mediastinum/Nodes: No mediastinal, hilar, or axillary adenopathy.
Small hiatal hernia. Trachea and thyroid unremarkable.

Lungs/Pleura: 5 mm anterior right upper lobe nodule on image 61 is
stable. 3 mm nodule along the right minor fissure on image 83 is
stable. Approximately 3 mm nodules in the lingula on image 95 and 96
are stable. Left lower lobe nodule on image 101, 3-4 mm is stable.
No new or enlarging pulmonary nodules. No pleural effusions.

Upper Abdomen: Imaging into the upper abdomen shows no acute
findings.

Musculoskeletal: Chest wall soft tissues are unremarkable. No acute
bony abnormality.
IMPRESSION: Stable small bilateral pulmonary nodules. These have been stable
since 11/03/2015. Additional follow-up in 1 year is recommended to
ensure 2 year stability.

Coronary artery disease.

Aortic Atherosclerosis ([id number].C).

## 2018-10-27 NOTE — Telephone Encounter (Signed)
Too soon for refill of clonidine

## 2018-11-02 ENCOUNTER — Other Ambulatory Visit: Payer: Self-pay | Admitting: Family Medicine

## 2018-11-05 DIAGNOSIS — I2510 Atherosclerotic heart disease of native coronary artery without angina pectoris: Secondary | ICD-10-CM | POA: Diagnosis not present

## 2018-11-05 DIAGNOSIS — I700 Atherosclerosis of aorta: Secondary | ICD-10-CM | POA: Diagnosis not present

## 2018-11-05 DIAGNOSIS — I10 Essential (primary) hypertension: Secondary | ICD-10-CM | POA: Diagnosis not present

## 2018-11-05 DIAGNOSIS — E782 Mixed hyperlipidemia: Secondary | ICD-10-CM | POA: Diagnosis not present

## 2018-11-30 ENCOUNTER — Other Ambulatory Visit: Payer: Self-pay | Admitting: Family Medicine

## 2018-12-03 ENCOUNTER — Other Ambulatory Visit: Payer: Self-pay | Admitting: Family Medicine

## 2018-12-04 NOTE — Telephone Encounter (Signed)
I just approved this on April 13th to same pharmacy:  Disp Refills Start End   hydrochlorothiazide (HYDRODIURIL) 25 MG tablet 90 tablet 0 11/30/2018    Sig: TAKE 1 TABLET BY MOUTH EVERY DAY. NEED LABS FOR FURTHER REFILLS   Sent to pharmacy as: hydrochlorothiazide (HYDRODIURIL) 25 MG tablet   E-Prescribing Status: Receipt confirmed by pharmacy (11/30/2018 2:59 PM EDT)

## 2018-12-11 ENCOUNTER — Other Ambulatory Visit: Payer: Self-pay

## 2018-12-11 ENCOUNTER — Ambulatory Visit: Payer: Medicare Other

## 2018-12-11 ENCOUNTER — Ambulatory Visit: Payer: Medicare Other | Admitting: Family Medicine

## 2019-02-05 ENCOUNTER — Ambulatory Visit: Payer: Medicare Other | Admitting: Nurse Practitioner

## 2019-02-08 ENCOUNTER — Encounter: Payer: Self-pay | Admitting: Nurse Practitioner

## 2019-02-08 ENCOUNTER — Ambulatory Visit
Admission: RE | Admit: 2019-02-08 | Discharge: 2019-02-08 | Disposition: A | Discharge status: Home / Self Care | Payer: Medicare Other | Source: Ambulatory Visit | Attending: Nurse Practitioner | Admitting: Nurse Practitioner

## 2019-02-08 ENCOUNTER — Other Ambulatory Visit: Payer: Self-pay

## 2019-02-08 ENCOUNTER — Ambulatory Visit (INDEPENDENT_AMBULATORY_CARE_PROVIDER_SITE_OTHER): Payer: Medicare Other | Admitting: Nurse Practitioner

## 2019-02-08 VITALS — BP 136/80 | HR 81 | Temp 98.5°F | Resp 14 | Ht 72.0 in | Wt 234.8 lb

## 2019-02-08 DIAGNOSIS — M79661 Pain in right lower leg: Secondary | ICD-10-CM | POA: Diagnosis not present

## 2019-02-08 DIAGNOSIS — M25471 Effusion, right ankle: Secondary | ICD-10-CM

## 2019-02-08 DIAGNOSIS — R600 Localized edema: Secondary | ICD-10-CM | POA: Diagnosis not present

## 2019-02-08 DIAGNOSIS — E782 Mixed hyperlipidemia: Secondary | ICD-10-CM

## 2019-02-08 DIAGNOSIS — R252 Cramp and spasm: Secondary | ICD-10-CM

## 2019-02-08 DIAGNOSIS — M79672 Pain in left foot: Secondary | ICD-10-CM

## 2019-02-08 DIAGNOSIS — L609 Nail disorder, unspecified: Secondary | ICD-10-CM

## 2019-02-08 DIAGNOSIS — M79671 Pain in right foot: Secondary | ICD-10-CM

## 2019-02-08 NOTE — Progress Notes (Signed)
Name: Jared Cline   MRN: [id number]    DOB: June 27, 1946   Date:02/08/2019       Progress Note  Subjective  Chief Complaint  Chief Complaint  Patient presents with  . Leg Swelling    right, onset 2 weeks with calf pain/cramp    HPI  Patient endorses right calf pain that is constant started about 2 weeks ago had sudden onset and has stayed there since. Patient denies injury, swelling, shortness of breath, chest pain, dizziness of lightheadedness. He takes daily 81mg  aspirin. Patient endorses intermittent right ankle swelling over the past 2 weeks. Patient endorse right foot burning sensation but has a bit on his left as well.   Patient had cramping in his left leg before and was diagnosed with hypokalemia and hypomagnesium and has been on supplements since then.   PHQ2/9: Depression screen [facility] 2/9 02/08/2019 06/11/2018 12/09/2017 06/09/2017 11/04/2016  Decreased Interest 0 0 0 0 0  Down, Depressed, Hopeless 0 0 0 0 0  PHQ - 2 Score 0 0 0 0 0  Altered sleeping 0 0 - - -  Tired, decreased energy 0 0 - - -  Change in appetite 0 0 - - -  Feeling bad or failure about yourself  0 0 - - -  Trouble concentrating 0 0 - - -  Moving slowly or fidgety/restless 0 0 - - -  Suicidal thoughts 0 0 - - -  PHQ-9 Score 0 0 - - -  Difficult doing work/chores Not difficult at all Not difficult at all - - -    PHQ reviewed. Negative  Patient Active Problem List   Diagnosis Date Noted  . Medication monitoring encounter 06/09/2017  . Coronary artery calcification seen on CAT scan 03/20/2017  . OSA on CPAP 11/22/2015  . Aortic atherosclerosis ([place]) 10/25/2015  . Right renal mass 10/19/2015  . Hyperlipidemia   . Hypertension   . CKD (chronic kidney disease) stage 2, GFR 60-89 ml/min   . Carpal tunnel syndrome, left   . History of prostate cancer   . Multiple lung nodules     Past Medical History:  Diagnosis Date  . BPH (benign prostatic hypertrophy)   . Bradycardia    evaluated by Dr. Nehemiah Massed   . Cancer [facility])    prostate  . Carpal tunnel syndrome, left    left arm  . CKD (chronic kidney disease) stage 2, GFR 60-89 ml/min   . Diverticulitis   . History of prostate cancer   . HNP (herniated nucleus pulposus), lumbar   . Hyperlipidemia   . Hypertension   . IBS (irritable bowel syndrome)   . Impotence   . Multiple lung nodules    nonspecific, largest 48mm in Jan 2016; scanned March 2017; rescan 3-6 months later  . OA (osteoarthritis) of knee    right  . Renal mass    evaluated; followed by urologist  . Sleep apnea    on CPAP  . Torn meniscus    x 2; right knee    Past Surgical History:  Procedure Laterality Date  . abdominal ultrasound  Oct 2015   Negative for eval for AAA  . COLONOSCOPY    . KNEE ARTHROSCOPY Right    x 2  . LUMBAR LAMINECTOMY    . PROSTATECTOMY     robotic    Social History   Tobacco Use  . Smoking status: Former Smoker    Packs/day: 0.50    Years: 10.00    Pack years:  5.00    Types: Cigarettes    Quit date: 08/20/1971    Years since quitting: 47.5  . Smokeless tobacco: Never Used  Substance Use Topics  . Alcohol use: No    Alcohol/week: 0.0 standard drinks     Current Outpatient Medications:  .  aspirin EC 81 MG tablet, Take 81 mg by mouth daily., Disp: , Rfl:  .  cloNIDine (CATAPRES) 0.3 MG tablet, TAKE 1 TABLET BY MOUTH TWICE DAILY, Disp: 180 tablet, Rfl: 1 .  hydrochlorothiazide (HYDRODIURIL) 25 MG tablet, TAKE 1 TABLET BY MOUTH EVERY DAY. NEED LABS FOR FURTHER REFILLS, Disp: 90 tablet, Rfl: 0 .  magnesium oxide (MAG-OX) 400 MG tablet, Take 400 mg by mouth daily., Disp: , Rfl:  .  Potassium Gluconate 550 MG TABS, Take 550 mg by mouth daily., Disp: , Rfl:  .  pravastatin (PRAVACHOL) 20 MG tablet, Take 20 mg by mouth at bedtime., Disp: , Rfl:  .  dicyclomine (BENTYL) 10 MG capsule, Take 1 capsule (10 mg total) by mouth 3 (three) times daily before meals. As needed for irritable bowel symptoms (Patient not taking: Reported on  02/08/2019), Disp: 30 capsule, Rfl: 1 .  hydrALAZINE (APRESOLINE) 25 MG tablet, Take 1 tablet (25 mg total) by mouth 2 (two) times daily. (Patient not taking: Reported on 02/08/2019), Disp: 60 tablet, Rfl: 5  Allergies  Allergen Reactions  . Flomax [Tamsulosin Hcl] Shortness Of Breath  . Ace Inhibitors Other (See Comments)    Angioedema  . Amlodipine Other (See Comments)    Angioedema  . Doxazosin   . Lipitor [Atorvastatin] Other (See Comments)    Memory loss    Review of Systems  Constitutional: Negative for chills, fever and malaise/fatigue.  HENT: Negative for congestion, sinus pain and sore throat.   Eyes: Negative for blurred vision.  Respiratory: Negative for cough and shortness of breath.   Cardiovascular: Negative for chest pain, palpitations and leg swelling.  Gastrointestinal: Negative for abdominal pain, constipation, diarrhea and nausea.  Genitourinary: Negative for dysuria.  Musculoskeletal: Negative for falls and joint pain.  Skin: Negative for rash.  Neurological: Negative for dizziness and headaches.  Endo/Heme/Allergies: Negative for polydipsia.  Psychiatric/Behavioral: The patient is not nervous/anxious and does not have insomnia.       No other specific complaints in a complete review of systems (except as listed in HPI above).  Objective  Vitals:   02/08/19 0937  BP: 136/80  Pulse: 81  Resp: 14  Temp: 98.5 F (36.9 C)  TempSrc: Oral  SpO2: 99%  Weight: 234 lb 12.8 oz (106.5 kg)  Height: 6' (1.829 m)    Body mass index is 31.84 kg/m.  Nursing Note and Vital Signs reviewed.  Physical Exam Constitutional:      Appearance: Normal appearance.  Cardiovascular:     Rate and Rhythm: Normal rate.     Pulses: Normal pulses.  Pulmonary:     Effort: Pulmonary effort is normal.  Musculoskeletal: Normal range of motion.        General: No tenderness, deformity or signs of injury.     Right lower leg: Edema present.     Left lower leg: No edema.        Feet:  Feet:     Right foot:     Toenail Condition: Right toenails are abnormally thick and long.     Left foot:     Toenail Condition: Left toenails are abnormally thick and long.  Neurological:     General: No  focal deficit present.     Mental Status: He is alert and oriented to person, place, and time.  Psychiatric:        Mood and Affect: Mood normal.        Behavior: Behavior normal.        No results found for this or any previous visit (from the past 48 hour(s)).  Assessment & Plan  1. Right calf pain Rule out DVT, electrolyte imbalances- treat MSK if negative, followup  In one month  - COMPLETE METABOLIC PANEL WITH GFR - Magnesium - US Venous Img Lower Unilateral Right; Future  2. Muscle cramping - COMPLETE METABOLIC PANEL WITH GFR - Magnesium  3. Mixed hyperlipidemia - Lipid Profile  4. Hypomagnesemia - Magnesium  5. Right ankle swelling Sodium restriction, elevation, compression stockings  - COMPLETE METABOLIC PANEL WITH GFR - US Venous Img Lower Unilateral Right; Future  6. Foot pain, bilateral - Ambulatory referral to Podiatry  7. Nail abnormalities Thick long nails  - Ambulatory referral to Podiatry

## 2019-02-08 NOTE — Patient Instructions (Signed)
-   Work on low salt diet - Elevated legs as often as you can - compression stockings during the day and take them off at night time - Get ultrasound and lab work today and we will call you within the next week for results.  - Sending a referral to a podiatrist, if you do not hear back within the next month please let us know. - Follow-up routinely with [place] in one month or sooner if needed for pain, swelling.  - If you develop severe shortness of breath, chest pain, severe swelling in your leg.

## 2019-02-09 ENCOUNTER — Other Ambulatory Visit: Payer: Self-pay | Admitting: Nurse Practitioner

## 2019-02-09 LAB — COMPLETE METABOLIC PANEL WITH GFR
AG Ratio: 1.9 (calc) (ref 1.0–2.5)
ALT: 11 U/L (ref 9–46)
AST: 18 U/L (ref 10–35)
Albumin: 4.6 g/dL (ref 3.6–5.1)
Alkaline phosphatase (APISO): 62 U/L (ref 35–144)
BUN/Creatinine Ratio: 14 (calc) (ref 6–22)
BUN: 18 mg/dL (ref 7–25)
CO2: 26 mmol/L (ref 20–32)
Calcium: 9.8 mg/dL (ref 8.6–10.3)
Chloride: 102 mmol/L (ref 98–110)
Creat: 1.27 mg/dL — ABNORMAL HIGH (ref 0.70–1.18)
GFR, Est African American: 64 mL/min/{1.73_m2} (ref >=60)
GFR, Est Non African American: 55 mL/min/{1.73_m2} — ABNORMAL LOW (ref >=60)
Globulin: 2.4 g/dL (calc) (ref 1.9–3.7)
Glucose, Bld: 111 mg/dL — ABNORMAL HIGH (ref 65–99)
Potassium: 4 mmol/L (ref 3.5–5.3)
Sodium: 136 mmol/L (ref 135–146)
Total Bilirubin: 0.5 mg/dL (ref 0.2–1.2)
Total Protein: 7 g/dL (ref 6.1–8.1)

## 2019-02-09 LAB — LIPID PANEL
Cholesterol: 152 mg/dL (ref <200)
HDL: 51 mg/dL (ref >=40)
LDL Cholesterol (Calc): 86 mg/dL (calc)
Non-HDL Cholesterol (Calc): 101 mg/dL (calc) (ref <130)
Total CHOL/HDL Ratio: 3 (calc) (ref <5.0)
Triglycerides: 65 mg/dL (ref <150)

## 2019-02-09 LAB — MAGNESIUM: Magnesium: 2.3 mg/dL (ref 1.5–2.5)

## 2019-02-09 MED ORDER — MELOXICAM 7.5 MG PO TABS: 7.5000 mg | ORAL_TABLET | Freq: Every day | ORAL | 0 refills | Status: DC | PRN

## 2019-02-12 ENCOUNTER — Ambulatory Visit (INDEPENDENT_AMBULATORY_CARE_PROVIDER_SITE_OTHER): Payer: Medicare Other

## 2019-02-12 ENCOUNTER — Other Ambulatory Visit: Payer: Self-pay | Admitting: Podiatry

## 2019-02-12 ENCOUNTER — Encounter: Payer: Self-pay | Admitting: Podiatry

## 2019-02-12 ENCOUNTER — Ambulatory Visit: Payer: Medicare Other | Admitting: Podiatry

## 2019-02-12 ENCOUNTER — Other Ambulatory Visit: Payer: Self-pay

## 2019-02-12 VITALS — Temp 97.3°F

## 2019-02-12 DIAGNOSIS — M79671 Pain in right foot: Secondary | ICD-10-CM

## 2019-02-12 DIAGNOSIS — M7752 Other enthesopathy of left foot: Secondary | ICD-10-CM

## 2019-02-12 DIAGNOSIS — M7751 Other enthesopathy of right foot: Secondary | ICD-10-CM

## 2019-02-12 DIAGNOSIS — M79672 Pain in left foot: Secondary | ICD-10-CM | POA: Diagnosis not present

## 2019-02-12 MED ORDER — MELOXICAM 15 MG PO TABS: 15.0000 mg | ORAL_TABLET | Freq: Every day | ORAL | 1 refills | Status: DC

## 2019-02-15 NOTE — Progress Notes (Signed)
   Subjective: 73 year old male presenting today as a new patient with a chief complaint of pain to the ball of bilateral feet that began about 3 weeks ago. He describes the pain as burning and states it is constant, even while sitting. Standing for long periods of time increases the pain. He has not done anything for treatment but has been trying to wear good shoe gear. Patient is here for further evaluation and treatment.   Past Medical History:  Diagnosis Date  . BPH (benign prostatic hypertrophy)   . Bradycardia    evaluated by Dr. Nehemiah Massed  . Cancer [facility])    prostate  . Carpal tunnel syndrome, left    left arm  . CKD (chronic kidney disease) stage 2, GFR 60-89 ml/min   . Diverticulitis   . History of prostate cancer   . HNP (herniated nucleus pulposus), lumbar   . Hyperlipidemia   . Hypertension   . IBS (irritable bowel syndrome)   . Impotence   . Multiple lung nodules    nonspecific, largest 34mm in Jan 2016; scanned March 2017; rescan 3-6 months later  . OA (osteoarthritis) of knee    right  . Renal mass    evaluated; followed by urologist  . Sleep apnea    on CPAP  . Torn meniscus    x 2; right knee    Objective: Physical Exam General: The patient is alert and oriented x3 in no acute distress.  Dermatology: Skin is cool, dry and supple bilateral lower extremities. Negative for open lesions or macerations.  Vascular: Palpable pedal pulses bilaterally. No edema or erythema noted. Capillary refill within normal limits.  Neurological: Epicritic and protective threshold grossly intact bilaterally.   Musculoskeletal Exam: Clinical evidence of bunion deformity noted to the respective foot. Lateral deviation of the hallux noted consistent with hallux abductovalgus. Hammertoe contracture also noted on clinical exam to digits 2- of the bilateral feet. Symptomatic pain on palpation and range of motion also noted to the metatarsal phalangeal joints of the respective hammertoe  digits.   Pain with palpation noted to the 2nd MPJ of the right foot.   Radiographic Exam: Increased intermetatarsal angle greater than 15 with a hallux abductus angle greater than 30 noted on AP view. Moderate degenerative changes noted within the first MPJ. Contracture deformity also noted to the interphalangeal joints and MPJs of the digits of the respective hammertoes.    Assessment: 1. HAV w/ bunion deformity bilateral -asymptomatic 2. Hammertoe deformity digits 2-5 bilateral 3. 2nd MPJ capsulitis     Plan of Care:  1. Patient was evaluated. X-Rays reviewed. 2. Injection of 0.5 mLs Celestone Soluspan injected into the 2nd MPJ of the right foot.  3. Prescription for Meloxicam provided to patient. 4. Continue wearing good shoes.  5. Return to clinic in 4 weeks.   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    Edrick Kins, DPM [facility]  Dr. Edrick Kins, [address]                                        [place], [place] [address]                Office [phone]  Fax [phone]

## 2019-02-16 NOTE — Telephone Encounter (Signed)
Please advise 

## 2019-02-28 ENCOUNTER — Other Ambulatory Visit: Payer: Self-pay | Admitting: Family Medicine

## 2019-03-08 ENCOUNTER — Other Ambulatory Visit: Payer: Self-pay

## 2019-03-08 ENCOUNTER — Encounter: Payer: Self-pay | Admitting: Nurse Practitioner

## 2019-03-08 ENCOUNTER — Ambulatory Visit (INDEPENDENT_AMBULATORY_CARE_PROVIDER_SITE_OTHER): Payer: Medicare Other | Admitting: Nurse Practitioner

## 2019-03-08 VITALS — BP 128/70 | HR 71 | Temp 96.8°F | Resp 14 | Ht 72.0 in | Wt 233.6 lb

## 2019-03-08 DIAGNOSIS — G4733 Obstructive sleep apnea (adult) (pediatric): Secondary | ICD-10-CM | POA: Diagnosis not present

## 2019-03-08 DIAGNOSIS — E669 Obesity, unspecified: Secondary | ICD-10-CM

## 2019-03-08 DIAGNOSIS — I119 Hypertensive heart disease without heart failure: Secondary | ICD-10-CM

## 2019-03-08 DIAGNOSIS — G5602 Carpal tunnel syndrome, left upper limb: Secondary | ICD-10-CM

## 2019-03-08 DIAGNOSIS — E782 Mixed hyperlipidemia: Secondary | ICD-10-CM

## 2019-03-08 DIAGNOSIS — I700 Atherosclerosis of aorta: Secondary | ICD-10-CM

## 2019-03-08 DIAGNOSIS — N182 Chronic kidney disease, stage 2 (mild): Secondary | ICD-10-CM

## 2019-03-08 DIAGNOSIS — I10 Essential (primary) hypertension: Secondary | ICD-10-CM | POA: Diagnosis not present

## 2019-03-08 DIAGNOSIS — Z9989 Dependence on other enabling machines and devices: Secondary | ICD-10-CM

## 2019-03-08 NOTE — Progress Notes (Signed)
Name: Jared Cline   MRN: [id number]    DOB: 04-12-1946   Date:03/08/2019       Progress Note  Subjective  Chief Complaint  Chief Complaint  Patient presents with  . Follow-up    HPI  Hypertension Patient is on hydrochlorothiazide 25mg  daily and clonidine 0.3mg  BID.  Takes medications as prescribed with no missed doses a month.  He did have an episode of lightheadedness when he got up too quickly but none since then.  He is compliant with low-salt diet.   Denies chest pain, headaches, blurry vision. He sees Dr. Nehemiah Massed- cardiologist twice a year.  BP Readings from Last 3 Encounters:  03/08/19 128/70  02/08/19 136/80  06/11/18 122/78    Hyperlipidemia Patient rx pravastatin 20mg  every night Takes medications as prescribed with no missed doses a month.  Diet: eats fruits daily, vegetables at least every other day. Has red meat rarely just a few times a month. Fried foods maybe twice a week.  Has atherosclerosis  Denies myalgias Lab Results  Component Value Date   CHOL 152 02/08/2019   HDL 51 02/08/2019   LDLCALC 86 02/08/2019   TRIG 65 02/08/2019   CHOLHDL 3.0 02/08/2019     Obstructive Sleep apnea CPAP 100% self reported compliance  Carpal tunnel, bilateral foot pain and right knee bakers cyst - Patient has followed up with specialists and now is just avoiding excessive use, resting and applying ice PRN. Has rx for meloxicam but states takes is sparingly just as needed. Will follow-up with specialties if needed   PHQ2/9: Depression screen [facility] 2/9 03/08/2019 02/08/2019 06/11/2018 12/09/2017 06/09/2017  Decreased Interest 0 0 0 0 0  Down, Depressed, Hopeless 0 0 0 0 0  PHQ - 2 Score 0 0 0 0 0  Altered sleeping 0 0 0 - -  Tired, decreased energy 0 0 0 - -  Change in appetite 0 0 0 - -  Feeling bad or failure about yourself  0 0 0 - -  Trouble concentrating 0 0 0 - -  Moving slowly or fidgety/restless 0 0 0 - -  Suicidal thoughts 0 0 0 - -  PHQ-9 Score 0 0 0 - -   Difficult doing work/chores Not difficult at all Not difficult at all Not difficult at all - -   PHQ reviewed. Negative  Patient Active Problem List   Diagnosis Date Noted  . LVH (left ventricular hypertrophy) due to hypertensive disease, without heart failure 05/06/2018  . First degree AV block 04/15/2018  . Medication monitoring encounter 06/09/2017  . Coronary artery calcification seen on CAT scan 03/20/2017  . OSA on CPAP 11/22/2015  . Aortic atherosclerosis ([place]) 10/25/2015  . Right renal mass 10/19/2015  . Hyperlipidemia   . Hypertension   . CKD (chronic kidney disease) stage 2, GFR 60-89 ml/min   . Carpal tunnel syndrome, left   . History of prostate cancer   . Multiple lung nodules     Past Medical History:  Diagnosis Date  . BPH (benign prostatic hypertrophy)   . Bradycardia    evaluated by Dr. Nehemiah Massed  . Cancer [facility])    prostate  . Carpal tunnel syndrome, left    left arm  . CKD (chronic kidney disease) stage 2, GFR 60-89 ml/min   . Diverticulitis   . History of prostate cancer   . HNP (herniated nucleus pulposus), lumbar   . Hyperlipidemia   . Hypertension   . IBS (irritable bowel syndrome)   .  Impotence   . Multiple lung nodules    nonspecific, largest 25mm in Jan 2016; scanned March 2017; rescan 3-6 months later  . OA (osteoarthritis) of knee    right  . Renal mass    evaluated; followed by urologist  . Sleep apnea    on CPAP  . Torn meniscus    x 2; right knee    Past Surgical History:  Procedure Laterality Date  . abdominal ultrasound  Oct 2015   Negative for eval for AAA  . COLONOSCOPY    . KNEE ARTHROSCOPY Right    x 2  . LUMBAR LAMINECTOMY    . PROSTATECTOMY     robotic    Social History   Tobacco Use  . Smoking status: Former Smoker    Packs/day: 0.50    Years: 10.00    Pack years: 5.00    Types: Cigarettes    Quit date: 08/20/1971    Years since quitting: 47.5  . Smokeless tobacco: Never Used  Substance Use Topics  .  Alcohol use: No    Alcohol/week: 0.0 standard drinks     Current Outpatient Medications:  .  aspirin EC 81 MG tablet, Take 81 mg by mouth daily., Disp: , Rfl:  .  cloNIDine (CATAPRES) 0.3 MG tablet, TAKE 1 TABLET BY MOUTH TWICE DAILY, Disp: 180 tablet, Rfl: 1 .  hydrochlorothiazide (HYDRODIURIL) 25 MG tablet, TAKE 1 TABLET BY MOUTH EVERY DAY, Disp: 90 tablet, Rfl: 0 .  magnesium oxide (MAG-OX) 400 MG tablet, Take 400 mg by mouth daily., Disp: , Rfl:  .  meloxicam (MOBIC) 15 MG tablet, TAKE 1 TABLET(15 MG) BY MOUTH DAILY, Disp: 90 tablet, Rfl: 0 .  Potassium Gluconate 550 MG TABS, Take 550 mg by mouth daily., Disp: , Rfl:  .  pravastatin (PRAVACHOL) 20 MG tablet, Take 20 mg by mouth at bedtime., Disp: , Rfl:   Allergies  Allergen Reactions  . Flomax [Tamsulosin Hcl] Shortness Of Breath  . Ace Inhibitors Other (See Comments)    Angioedema  . Amlodipine Other (See Comments)    Angioedema  . Doxazosin   . Lipitor [Atorvastatin] Other (See Comments)    Memory loss    Review of Systems  Constitutional: Negative for chills, fever and malaise/fatigue.  HENT: Negative for congestion, sinus pain and sore throat.   Eyes: Negative for blurred vision.  Respiratory: Negative for cough and shortness of breath.   Cardiovascular: Negative for chest pain, palpitations and leg swelling.  Gastrointestinal: Negative for abdominal pain, constipation, diarrhea and nausea.  Genitourinary: Negative for dysuria.  Musculoskeletal: Negative for falls and joint pain.  Skin: Negative for rash.  Neurological: Negative for dizziness and headaches.  Endo/Heme/Allergies: Negative for polydipsia.  Psychiatric/Behavioral: The patient is not nervous/anxious and does not have insomnia.       No other specific complaints in a complete review of systems (except as listed in HPI above).  Objective  Vitals:   03/08/19 0910  BP: 128/70  Pulse: 71  Resp: 14  Temp: (!) 96.8 F (36 C)  TempSrc: Temporal   SpO2: 99%  Weight: 233 lb 9.6 oz (106 kg)  Height: 6' (1.829 m)     Body mass index is 31.68 kg/m.  Nursing Note and Vital Signs reviewed.  Physical Exam Vitals signs reviewed.  Constitutional:      Appearance: He is well-developed.  HENT:     Head: Normocephalic and atraumatic.  Neck:     Musculoskeletal: Normal range of motion and neck  supple.     Vascular: No carotid bruit.  Cardiovascular:     Heart sounds: Normal heart sounds.  Pulmonary:     Effort: Pulmonary effort is normal.     Breath sounds: Normal breath sounds.  Abdominal:     General: Bowel sounds are normal.     Palpations: Abdomen is soft.     Tenderness: There is no abdominal tenderness.  Musculoskeletal: Normal range of motion.  Skin:    General: Skin is warm and dry.     Capillary Refill: Capillary refill takes less than 2 seconds.  Neurological:     Mental Status: He is alert and oriented to person, place, and time.     GCS: GCS eye subscore is 4. GCS verbal subscore is 5. GCS motor subscore is 6.     Sensory: No sensory deficit.  Psychiatric:        Speech: Speech normal.        Behavior: Behavior normal.        Thought Content: Thought content normal.        Judgment: Judgment normal.        No results found for this or any previous visit (from the past 48 hour(s)).  Assessment & Plan  1. Essential hypertension Stable continue meds, discussed lowering clonidine if having another episode of lightheadedeness- will inform us. DASH   2. Aortic atherosclerosis (HCC) Continue statin, diet disucssed  3. LVH (left ventricular hypertrophy) due to hypertensive disease, without heart failure asymptomatic  4. OSA on CPAP Continue CPAP  5. Carpal tunnel syndrome, left stable  6. CKD (chronic kidney disease) stage 2, GFR 60-89 ml/min Hydration, avoids nsaids  7. Mixed hyperlipidemia Continue statin, LDL not quite at goal under 70- will work on diet.   8. Obesity (BMI  30.0-34.9) Healthy eating and exercise.

## 2019-03-08 NOTE — Patient Instructions (Addendum)
- Please let us know if you have another episode of lightheadedness as we may need to adjust your blood pressure medications. Atherosclerosis  Atherosclerosis is narrowing and hardening of the arteries. Arteries are blood vessels that carry blood from the heart to all parts of the body. This blood contains oxygen. Arteries can become narrow or clogged with a buildup of fat, cholesterol, calcium, and other substances (plaque). Plaque decreases the amount of blood that can flow through the artery. Atherosclerosis can affect any artery in the body, including:  Heart arteries (coronary artery disease). This may cause a heart attack.  Brain arteries. This may cause a stroke (cerebrovascular accident).  Leg, arm, and pelvis arteries (peripheral artery disease). This may cause pain and numbness.  Kidney arteries. This may cause kidney (renal) failure. Treatment may slow the disease and prevent further damage to the heart, brain, peripheral arteries, and kidneys. What are the causes? Atherosclerosis develops slowly over many years. The inner layers of your arteries become damaged and allow the gradual buildup of plaque. The exact cause of atherosclerosis is not fully understood. Symptoms of atherosclerosis do not occur until the artery becomes narrow or blocked. What increases the risk? The following factors may make you more likely to develop this condition:  High blood pressure.  High cholesterol.  Being middle-aged or older.  Having a family history of atherosclerosis.  Having high blood fats (triglycerides).  Diabetes.  Being overweight.  Smoking tobacco.  Not exercising enough (sedentary lifestyle).  Having a substance in the blood called C-reactive protein (CRP). This is a sign of increased levels of inflammation in the body.  Sleep apnea.  Being stressed.  Drinking too much alcohol. What are the signs or symptoms? This condition may not cause any symptoms. If you have  symptoms, they are caused by damage to an area of your body that is not getting enough blood.  Coronary artery disease may cause chest pain and shortness of breath.  Decreased blood supply to your brain may cause a stroke. Signs of a stroke may include sudden: ? Weakness on one side of the body. ? Confusion. ? Changes in vision. ? Inability to speak or understand speech. ? Loss of balance, coordination, or the ability to walk. ? Severe headache. ? Loss of consciousness.  Peripheral arterial disease may cause pain and numbness, often in the legs and hips.  Renal failure may cause fatigue, nausea, swelling, and itchy skin. How is this diagnosed? This condition is diagnosed based on your medical history and a physical exam. During the exam:  Your health care provider will: ? Check your pulse in different places. ? Listen for a "whooshing" sound over your arteries (bruit).  You may have tests, such as: ? Blood tests to check your levels of cholesterol, triglycerides, and CRP. ? Electrocardiogram (ECG) to check for heart damage. ? Chest X-ray to see if you have an enlarged heart, which is a sign of heart failure. ? Stress test to see how your heart reacts to exercise. ? Echocardiogram to get images of the inside of your heart. ? Ankle-brachial index to compare blood pressure in your arms to blood pressure in your ankles. ? Ultrasound of your peripheral arteries to check blood flow. ? CT scan to check for damage to your heart or brain. ? X-rays of blood vessels after dye has been injected (angiogram) to check blood flow. How is this treated? Treatment starts with lifestyle changes, which may include:  Changing your diet.  Losing weight.  Reducing stress.  Exercising and being physically active more regularly.  Not smoking. You may also need medicine to:  Lower triglycerides and cholesterol.  Control blood pressure.  Prevent blood clots.  Lower inflammation in your body.   Control your blood sugar. Sometimes, surgery is needed to:  Remove plaque from an artery (endarterectomy).  Open or widen a narrowed heart artery (angioplasty).  Create a new path for your blood with one of these procedures: ? Heart (coronary) artery bypass graft surgery. ? Peripheral artery bypass graft surgery. Follow these instructions at home: Eating and drinking   Eat a heart-healthy diet. Talk with your health care provider or a diet and nutrition specialist (dietitian) if you need help. A heart-healthy diet involves: ? Limiting unhealthy fats and increasing healthy fats. Some examples of healthy fats are olive oil and canola oil. ? Eating plant-based foods, such as fruits, vegetables, nuts, whole grains, and legumes (such as peas and lentils).  Limit alcohol intake to no more than 1 drink a day for nonpregnant women and 2 drinks a day for men. One drink equals 12 oz of beer, 5 oz of wine, or 1 oz of hard liquor. Lifestyle  Follow an exercise program as told by your health care provider.  Maintain a healthy weight. Lose weight if your health care provider says that you need to do that.  Rest when you are tired.  Learn to manage your stress.  Do not use any products that contain nicotine or tobacco, such as cigarettes and e-cigarettes. If you need help quitting, ask your health care provider.  Do not abuse drugs. General instructions  Take over-the-counter and prescription medicines only as told by your health care provider.  Manage other health conditions as told by your health care provider.  Keep all follow-up visits as told by your health care provider. This is important. Contact a health care provider if:  You have chest pain or discomfort. This includes squeezing chest pain that may feel like indigestion (angina).  You have shortness of breath.  You have an irregular heartbeat.  You have unexplained fatigue.  You have unexplained pain or numbness in an  arm, leg, or hip.  You have nausea, swelling of your hands or feet, and itchy skin. Get help right away if:  You have any symptoms of a heart attack, such as: ? Chest pain. ? Shortness of breath. ? Pain in your neck, jaw, arms, back, or stomach. ? Cold sweat. ? Nausea. ? Light-headedness.  You have any symptoms of a stroke. "BE FAST" is an easy way to remember the main warning signs of a stroke: ? B - Balance. Signs are dizziness, sudden trouble walking, or loss of balance. ? E - Eyes. Signs are trouble seeing or a sudden change in vision. ? F - Face. Signs are sudden weakness or numbness of the face, or the face or eyelid drooping on one side. ? A - Arms. Signs are weakness or numbness in an arm. This happens suddenly and usually on one side of the body. ? S - Speech. Signs are sudden trouble speaking, slurred speech, or trouble understanding what people say. ? T - Time. Time to call emergency services. Write down what time symptoms started.  You have other signs of a stroke, such as: ? A sudden, severe headache with no known cause. ? Nausea or vomiting. ? Seizure. These symptoms may represent a serious problem that is an emergency. Do not wait to see if the symptoms  will go away. Get medical help right away. Call your local emergency services (911 in the [place] Do not drive yourself to the hospital. Summary  Atherosclerosis is narrowing and hardening of the arteries.  Arteries can become narrow or clogged with a buildup of fat, cholesterol, calcium, and other substances (plaque).  This condition may not cause any symptoms. If you do have symptoms, they are caused by damage to an area of your body that is not getting enough blood.  Treatment may include lifestyle changes and medicines. In some cases, surgery is needed. This information is not intended to replace advice given to you by your health care provider. Make sure you discuss any questions you have with your health care  provider. Document Released: 10/26/2003 Document Revised: 11/14/2017 Document Reviewed: 04/10/2017 Elsevier Patient Education  2020 [employer].

## 2019-03-12 ENCOUNTER — Other Ambulatory Visit: Payer: Self-pay

## 2019-03-12 ENCOUNTER — Ambulatory Visit: Payer: Medicare Other | Admitting: Podiatry

## 2019-03-12 ENCOUNTER — Encounter: Payer: Self-pay | Admitting: Podiatry

## 2019-03-12 VITALS — Temp 97.4°F

## 2019-03-12 DIAGNOSIS — M79671 Pain in right foot: Secondary | ICD-10-CM

## 2019-03-12 DIAGNOSIS — M79672 Pain in left foot: Secondary | ICD-10-CM | POA: Diagnosis not present

## 2019-03-12 DIAGNOSIS — M7751 Other enthesopathy of right foot: Secondary | ICD-10-CM

## 2019-03-19 NOTE — Progress Notes (Signed)
   Subjective: 73 year old male presenting today for follow up evaluation of bilateral foot pain. He states he has improved significantly and is only experiencing mild pain at this time. He has been taking Meloxicam as directed. He also states the injection helped resolve the pain of the 2nd MPJ of the right foot. Patient is here for further evaluation and treatment.    Past Medical History:  Diagnosis Date  . BPH (benign prostatic hypertrophy)   . Bradycardia    evaluated by Dr. Nehemiah Massed  . Cancer [facility])    prostate  . Carpal tunnel syndrome, left    left arm  . CKD (chronic kidney disease) stage 2, GFR 60-89 ml/min   . Diverticulitis   . History of prostate cancer   . HNP (herniated nucleus pulposus), lumbar   . Hyperlipidemia   . Hypertension   . IBS (irritable bowel syndrome)   . Impotence   . Multiple lung nodules    nonspecific, largest 60mm in Jan 2016; scanned March 2017; rescan 3-6 months later  . OA (osteoarthritis) of knee    right  . Renal mass    evaluated; followed by urologist  . Sleep apnea    on CPAP  . Torn meniscus    x 2; right knee    Objective: Physical Exam General: The patient is alert and oriented x3 in no acute distress.  Dermatology: Skin is cool, dry and supple bilateral lower extremities. Negative for open lesions or macerations.  Vascular: Palpable pedal pulses bilaterally. No edema or erythema noted. Capillary refill within normal limits.  Neurological: Epicritic and protective threshold grossly intact bilaterally.   Musculoskeletal Exam: Clinical evidence of bunion deformity noted to the respective foot. Lateral deviation of the hallux noted consistent with hallux abductovalgus. Hammertoe contracture also noted on clinical exam to digits 2- of the bilateral feet. Symptomatic pain on palpation and range of motion also noted to the metatarsal phalangeal joints of the respective hammertoe digits.     Assessment: 1. HAV w/ bunion deformity  bilateral -asymptomatic 2. Hammertoe deformity digits 2-5 bilateral 3. 2nd MPJ capsulitis - resolved    Plan of Care:  1. Patient was evaluated. 2. Continue taking Meloxicam as needed.  3. Recommended good shoe gear.  4. Return to clinic as needed.    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    Edrick Kins, DPM [facility]  Dr. Edrick Kins, [address]                                        [place],  [address]                Office [phone]  Fax [phone]

## 2019-05-17 DIAGNOSIS — I440 Atrioventricular block, first degree: Secondary | ICD-10-CM | POA: Diagnosis not present

## 2019-05-17 DIAGNOSIS — I700 Atherosclerosis of aorta: Secondary | ICD-10-CM | POA: Diagnosis not present

## 2019-05-17 DIAGNOSIS — I10 Essential (primary) hypertension: Secondary | ICD-10-CM | POA: Diagnosis not present

## 2019-05-17 DIAGNOSIS — I2510 Atherosclerotic heart disease of native coronary artery without angina pectoris: Secondary | ICD-10-CM | POA: Diagnosis not present

## 2019-05-25 ENCOUNTER — Ambulatory Visit (INDEPENDENT_AMBULATORY_CARE_PROVIDER_SITE_OTHER): Payer: Medicare Other | Admitting: Family Medicine

## 2019-05-25 ENCOUNTER — Encounter: Payer: Self-pay | Admitting: Family Medicine

## 2019-05-25 ENCOUNTER — Other Ambulatory Visit: Payer: Self-pay

## 2019-05-25 VITALS — BP 140/72 | HR 69 | Temp 96.6°F | Resp 14 | Ht 72.0 in | Wt 232.2 lb

## 2019-05-25 DIAGNOSIS — M7121 Synovial cyst of popliteal space [Baker], right knee: Secondary | ICD-10-CM

## 2019-05-25 DIAGNOSIS — Z23 Encounter for immunization: Secondary | ICD-10-CM | POA: Diagnosis not present

## 2019-05-25 MED ORDER — MELOXICAM 7.5 MG PO TABS: 7.5000 mg | ORAL_TABLET | Freq: Every day | ORAL | 0 refills | Status: DC

## 2019-05-25 NOTE — Progress Notes (Signed)
Patient ID: Jared Cline, male    DOB: March 17, 1946, 73 y.o.   MRN: [id number]  PCP: Delsa Grana, PA-C  Chief Complaint  Patient presents with  . Cyst     two cyst behind right leg.     Subjective:   Jared Cline is a 73 y.o. male, presents to clinic with CC of the following:  HPI  Pt here for right posterior knee pain and swelling.  He was seen for this same CC - having right calf pain and muscle spasms, OV was 02/08/2019 and he had sx for three weeks before then.  He went to have a DVT study which was negative and it showed bakers cyst.   Pain is currently constant, gradually worsening for 3-4 months, is exacerbated with standing up and walking, pain at worst 8/10 pain at baseline achy gnawing, now with activity and standing more severe and sharp, he can't be up for more than 30-40 min he has to go sit down.  Ice packs several times a day help temporarily helps the pain. He denies redness or warmth to the skin to back of right knee.  He denies any knee pain or swelling.  No LE edema below knee.  He has hx of muscle cramps but with supplements that has improved.  No claudication sx.  Patient Active Problem List   Diagnosis Date Noted  . LVH (left ventricular hypertrophy) due to hypertensive disease, without heart failure 05/06/2018  . First degree AV block 04/15/2018  . Medication monitoring encounter 06/09/2017  . Coronary artery calcification seen on CAT scan 03/20/2017  . OSA on CPAP 11/22/2015  . Aortic atherosclerosis ([place]) 10/25/2015  . Right renal mass 10/19/2015  . Hyperlipidemia   . Hypertension   . CKD (chronic kidney disease) stage 2, GFR 60-89 ml/min   . Carpal tunnel syndrome, left   . History of prostate cancer   . Multiple lung nodules       Current Outpatient Medications:  .  aspirin EC 81 MG tablet, Take 81 mg by mouth daily., Disp: , Rfl:  .  cloNIDine (CATAPRES) 0.3 MG tablet, TAKE 1 TABLET BY MOUTH TWICE DAILY, Disp: 180 tablet, Rfl: 1 .   hydrochlorothiazide (HYDRODIURIL) 25 MG tablet, TAKE 1 TABLET BY MOUTH EVERY DAY, Disp: 90 tablet, Rfl: 0 .  magnesium oxide (MAG-OX) 400 MG tablet, Take 400 mg by mouth daily., Disp: , Rfl:  .  Potassium Gluconate 550 MG TABS, Take 550 mg by mouth daily., Disp: , Rfl:  .  pravastatin (PRAVACHOL) 20 MG tablet, Take 20 mg by mouth at bedtime., Disp: , Rfl:  .  meloxicam (MOBIC) 15 MG tablet, TAKE 1 TABLET(15 MG) BY MOUTH DAILY (Patient not taking: Reported on 05/25/2019), Disp: 90 tablet, Rfl: 0   Allergies  Allergen Reactions  . Flomax [Tamsulosin Hcl] Shortness Of Breath  . Ace Inhibitors Other (See Comments)    Angioedema  . Amlodipine Other (See Comments)    Angioedema  . Doxazosin   . Lipitor [Atorvastatin] Other (See Comments)    Memory loss     Family History  Problem Relation Age of Onset  . Stroke Mother   . Hypertension Mother   . Aneurysm Father        cerebral  . Hypertension Father   . Heart disease Paternal Uncle   . Alcohol abuse Brother   . Cancer Neg Hx   . COPD Neg Hx   . Diabetes Neg Hx  Social History   Socioeconomic History  . Marital status: Married    Spouse name: Not on file  . Number of children: 3  . Years of education: Not on file  . Highest education level: Not on file  Occupational History  . Occupation: retired city of [employer]  . Financial resource strain: Not on file  . Food insecurity    Worry: Not on file    Inability: Not on file  . Transportation needs    Medical: Not on file    Non-medical: Not on file  Tobacco Use  . Smoking status: Former Smoker    Packs/day: 0.50    Years: 10.00    Pack years: 5.00    Types: Cigarettes    Quit date: 08/20/1971    Years since quitting: 47.7  . Smokeless tobacco: Never Used  Substance and Sexual Activity  . Alcohol use: No    Alcohol/week: 0.0 standard drinks  . Drug use: No  . Sexual activity: Not Currently  Lifestyle  . Physical activity    Days per week: Not  on file    Minutes per session: Not on file  . Stress: Not on file  Relationships  . Social Herbalist on phone: Not on file    Gets together: Not on file    Attends religious service: Not on file    Active member of club or organization: Not on file    Attends meetings of clubs or organizations: Not on file    Relationship status: Not on file  . Intimate partner violence    Fear of current or ex partner: Not on file    Emotionally abused: Not on file    Physically abused: Not on file    Forced sexual activity: Not on file  Other Topics Concern  . Not on file  Social History Narrative  . Not on file    I personally reviewed active problem list, medication list, allergies, family history, social history, health maintenance, notes from last encounter, lab results, imaging with the patient/caregiver today.  Review of Systems  Constitutional: Negative.   HENT: Negative.   Eyes: Negative.   Respiratory: Negative.   Cardiovascular: Negative.   Gastrointestinal: Negative.   Endocrine: Negative.   Genitourinary: Negative.   Musculoskeletal: Negative.   Skin: Negative.   Allergic/Immunologic: Negative.   Neurological: Negative.   Hematological: Negative.   Psychiatric/Behavioral: Negative.   All other systems reviewed and are negative.     Objective:   Vitals:   05/25/19 1032  Pulse: 69  Resp: 14  Temp: (!) 96.6 F (35.9 C)  SpO2: 99%  Weight: 232 lb 3.2 oz (105.3 kg)  Height: 6' (1.829 m)    Body mass index is 31.49 kg/m.  Physical Exam Vitals signs and nursing note reviewed.  Constitutional:      Appearance: He is well-developed.  HENT:     Head: Normocephalic and atraumatic.     Nose: Nose normal.  Eyes:     General:        Right eye: No discharge.        Left eye: No discharge.     Conjunctiva/sclera: Conjunctivae normal.  Neck:     Trachea: No tracheal deviation.  Cardiovascular:     Rate and Rhythm: Normal rate and regular rhythm.   Pulmonary:     Effort: Pulmonary effort is normal. No respiratory distress.     Breath sounds: No stridor.  Musculoskeletal:  General: Swelling present. No tenderness.     Right knee: He exhibits swelling (mild defuse right knee swelling anterior and to popliteal fossa, no palpable cord, no nodule/masses/fluctuance). He exhibits normal range of motion, no effusion, no deformity, no laceration, no erythema, normal patellar mobility and no bony tenderness. No tenderness found. No patellar tendon tenderness noted.     Right lower leg: No edema.     Left lower leg: No edema.  Skin:    General: Skin is warm and dry.     Coloration: Skin is not pale.     Findings: No bruising, erythema or rash.  Neurological:     Mental Status: He is alert.     Sensory: No sensory deficit.     Motor: No weakness or abnormal muscle tone.     Coordination: Coordination normal.     Gait: Gait abnormal (antalgic gait).  Psychiatric:        Mood and Affect: Mood normal.        Behavior: Behavior normal.       US Venous Img Lower Unilateral Right - DVT study done 02/08/2019: CLINICAL DATA:  Right calf pain and ankle edema.  EXAM: RIGHT LOWER EXTREMITY VENOUS DOPPLER ULTRASOUND  TECHNIQUE: Gray-scale sonography with graded compression, as well as color Doppler and duplex ultrasound were performed to evaluate the lower extremity deep venous systems from the level of the common femoral vein and including the common femoral, femoral, profunda femoral, popliteal and calf veins including the posterior tibial, peroneal and gastrocnemius veins when visible. The superficial great saphenous vein was also interrogated. Spectral Doppler was utilized to evaluate flow at rest and with distal augmentation maneuvers in the common femoral, femoral and popliteal veins.  COMPARISON:  02/28/2009  FINDINGS: Contralateral Common Femoral Vein: Respiratory phasicity is normal and symmetric with the symptomatic  side. No evidence of thrombus. Normal compressibility.  Common Femoral Vein: No evidence of thrombus. Normal compressibility, respiratory phasicity and response to augmentation.  Saphenofemoral Junction: No evidence of thrombus. Normal compressibility and flow on color Doppler imaging.  Profunda Femoral Vein: No evidence of thrombus. Normal compressibility and flow on color Doppler imaging.  Femoral Vein: No evidence of thrombus. Normal compressibility, respiratory phasicity and response to augmentation.  Popliteal Vein: No evidence of thrombus. Normal compressibility, respiratory phasicity and response to augmentation.  Calf Veins: No evidence of thrombus. Normal compressibility and flow on color Doppler imaging.  Superficial Great Saphenous Vein: No evidence of thrombus. Normal compressibility.  Venous Reflux:  None.  Other Findings: No evidence of superficial thrombophlebitis. There are 2 small fluid collections in the popliteal fossa. The larger measures approximately 2.7 cm in greatest length and an adjacent collection measures 2.2 cm in greatest length. Findings are likely consistent with Baker's cysts.  IMPRESSION: 1. No evidence of right lower extremity deep venous thrombosis. 2. 2 separate small fluid collections in the right popliteal fossa likely represent Baker's cysts.   Electronically Signed   By: Aletta Edouard M.D.   On: 02/08/2019 14:19         Assessment & Plan:      ICD-10-CM   1. Bakers cyst, right  M71.21 Ambulatory referral to Orthopedics    meloxicam (MOBIC) 7.5 MG tablet  2. Need for immunization against influenza  Z23 Flu Vaccine QUAD High Dose(Fluad)    Symptomatic bakers cyst right knee for the past 4 months, worsening , refer to ortho for eval and tx Reviewed recent relevant OV, imaging results, labs etc. Encouraged compression,  ice compresses, try mobic again, and f/up with ortho. Concerning signs and sx also  reviewed, encouraged him to come back immediately with any LE edema, redness, warmth, worsening pain if not into to ortho and sx change or worsen. Pt verbalized understanding, handout given    Delsa Grana, PA-C 05/25/19 10:44 AM

## 2019-05-25 NOTE — Patient Instructions (Signed)
Baker Cyst A Baker cyst, also called a popliteal cyst, is a sac-like growth that forms at the back of the knee. The cyst forms when the fluid-filled sac (bursa) that cushions the knee joint becomes enlarged. The bursa that becomes a Baker cyst is located at the back of the knee joint. What are the causes? In most cases, a Baker cyst results from another knee problem that causes swelling inside the knee. This makes the fluid inside the knee joint (synovial fluid) flow into the bursa behind the knee, causing the bursa to enlarge. What increases the risk? You may be more likely to develop a Baker cyst if you already have a knee problem, such as:  A tear in cartilage that cushions the knee joint (meniscal tear).  A tear in the tissues that connect the bones of the knee joint (ligament tear).  Knee swelling from osteoarthritis, rheumatoid arthritis, or gout. What are the signs or symptoms? A Baker cyst does not always cause symptoms. A lump behind the knee may be the only sign of the condition. The lump may be painful, especially when the knee is straightened. If the lump is painful, the pain may come and go. The knee may also be stiff. Symptoms may quickly get more severe if the cyst breaks open (ruptures). If your cyst ruptures, signs and symptoms may affect the knee and the back of the lower leg (calf) and may include:  Sudden or worsening pain.  Swelling.  Bruising. How is this diagnosed? This condition may be diagnosed based on your symptoms and medical history. Your health care provider will also do a physical exam. This may include:  Feeling the cyst to check whether it is tender.  Checking your knee for signs of another knee condition that causes swelling. You may have imaging tests, such as:  X-rays.  MRI.  Ultrasound. How is this treated? A Baker cyst that is not painful may go away without treatment. If the cyst gets large or painful, it will likely get better if the  underlying knee problem is treated. Treatment for a Baker cyst may include:  Resting.  Keeping weight off of the knee. This means not leaning on the knee to support your body weight.  NSAIDs to reduce pain and swelling.  A procedure to drain the fluid from the cyst with a needle (aspiration). You may also get an injection of a medicine that reduces swelling (steroid).  Surgery. This may be needed if other treatments do not work. This usually involves correcting knee damage and removing the cyst. Follow these instructions at home:   Take over-the-counter and prescription medicines only as told by your health care provider.  Rest and return to your normal activities as told by your health care provider. Avoid activities that make pain or swelling worse. Ask your health care provider what activities are safe for you.  Keep all follow-up visits as told by your health care provider. This is important. Contact a health care provider if:  You have knee pain, stiffness, or swelling that does not get better. Get help right away if:  You have sudden or worsening pain and swelling in your calf area. This information is not intended to replace advice given to you by your health care provider. Make sure you discuss any questions you have with your health care provider. Document Released: 08/05/2005 Document Revised: 07/18/2017 Document Reviewed: 04/25/2016 Elsevier Patient Education  2020 [employer]  

## 2019-06-05 ENCOUNTER — Other Ambulatory Visit: Payer: Self-pay | Admitting: Family Medicine

## 2019-06-05 NOTE — Telephone Encounter (Signed)
Requested Prescriptions  Pending Prescriptions Disp Refills  . hydrochlorothiazide (HYDRODIURIL) 25 MG tablet [Pharmacy Med Name: HYDROCHLOROTHIAZIDE 25MG  TABLETS] 90 tablet 0    Sig: TAKE 1 TABLET BY MOUTH EVERY DAY     Cardiovascular: Diuretics - Thiazide Failed - 06/05/2019  3:26 PM      Failed - Cr in normal range and within 360 days    Creat  Date Value Ref Range Status  02/08/2019 1.27 (H) 0.70 - 1.18 mg/dL Final    Comment:    For patients >62 years of age, the reference limit for Creatinine is approximately 13% higher for people identified as African-American. .          Failed - Last BP in normal range    BP Readings from Last 1 Encounters:  05/25/19 140/72         Passed - Ca in normal range and within 360 days    Calcium  Date Value Ref Range Status  02/08/2019 9.8 8.6 - 10.3 mg/dL Final   Calcium, Total  Date Value Ref Range Status  08/25/2014 9.1 8.5 - 10.1 mg/dL Final         Passed - K in normal range and within 360 days    Potassium  Date Value Ref Range Status  02/08/2019 4.0 3.5 - 5.3 mmol/L Final  08/25/2014 4.1 3.5 - 5.1 mmol/L Final         Passed - Na in normal range and within 360 days    Sodium  Date Value Ref Range Status  02/08/2019 136 135 - 146 mmol/L Final  10/19/2015 135 134 - 144 mmol/L Final  08/25/2014 133 (L) 136 - 145 mmol/L Final         Passed - Valid encounter within last 6 months    Recent Outpatient Visits          1 week ago Bakers cyst, right   [facility] Delsa Grana, PA-C   2 months ago Essential hypertension   [place], NP   3 months ago Right calf pain   [place], NP   11 months ago Essential hypertension   [place], Satira Anis, MD   1 year ago Essential hypertension   [place], Satira Anis, MD      Future Appointments            In 3 months  Uvaldo Rising, Astrid Divine, [facility], [facility]

## 2019-06-15 DIAGNOSIS — I700 Atherosclerosis of aorta: Secondary | ICD-10-CM | POA: Diagnosis not present

## 2019-06-15 DIAGNOSIS — E782 Mixed hyperlipidemia: Secondary | ICD-10-CM | POA: Diagnosis not present

## 2019-06-15 DIAGNOSIS — I10 Essential (primary) hypertension: Secondary | ICD-10-CM | POA: Diagnosis not present

## 2019-06-15 DIAGNOSIS — I2510 Atherosclerotic heart disease of native coronary artery without angina pectoris: Secondary | ICD-10-CM | POA: Diagnosis not present

## 2019-06-16 ENCOUNTER — Telehealth: Payer: Self-pay | Admitting: Family Medicine

## 2019-06-16 NOTE — Telephone Encounter (Signed)
Erronous. You handled it pt here in offuce

## 2019-06-18 DIAGNOSIS — M170 Bilateral primary osteoarthritis of knee: Secondary | ICD-10-CM | POA: Diagnosis not present

## 2019-06-28 ENCOUNTER — Other Ambulatory Visit: Payer: Self-pay

## 2019-06-28 MED ORDER — CLONIDINE HCL 0.3 MG PO TABS: 0.3000 mg | ORAL_TABLET | Freq: Two times a day (BID) | ORAL | 1 refills | Status: DC

## 2019-06-28 NOTE — Telephone Encounter (Signed)
Pt seeing cardiology for BP management, last note reviewed, added 25 mg losartan and kept 0.3 BID clonidine, refills approved - pt should keep cardiology f/up

## 2019-07-01 ENCOUNTER — Ambulatory Visit (INDEPENDENT_AMBULATORY_CARE_PROVIDER_SITE_OTHER): Payer: Medicare Other

## 2019-07-01 ENCOUNTER — Other Ambulatory Visit: Payer: Self-pay

## 2019-07-01 VITALS — BP 144/78 | HR 69 | Temp 96.8°F | Resp 16 | Ht 72.0 in | Wt 233.7 lb

## 2019-07-01 DIAGNOSIS — Z0000 Encounter for general adult medical examination without abnormal findings: Secondary | ICD-10-CM | POA: Diagnosis not present

## 2019-07-01 NOTE — Patient Instructions (Signed)
Jared Cline , Thank you for taking time to come for your Medicare Wellness Visit. I appreciate your ongoing commitment to your health goals. Please review the following plan we discussed and let me know if I can assist you in the future.   Screening recomm/endations/referrals: Colonoscopy: done 12/25/15. Repeat in 2022. Recommended yearly ophthalmology/optometry visit for glaucoma screening and checkup Recommended yearly dental visit for hygiene and checkup  Vaccinations: Influenza vaccine: done 05/25/19 Pneumococcal vaccine: done 06/09/17 Tdap vaccine: done 05/21/11 Shingles vaccine: Shingrix discussed. Please contact your pharmacy for coverage information.   Advanced directives: Please bring a copy of your health care power of attorney and living will to the office at your convenience once you have completed those documents.   Conditions/risks identified: Recommend healthy eating and physical activity for desired weight loss  Next appointment: Please follow up in one year for your Medicare Annual Wellness visit.    Preventive Care 51 Years and Older, Male Preventive care refers to lifestyle choices and visits with your health care provider that can promote health and wellness. What does preventive care include?  A yearly physical exam. This is also called an annual well check.  Dental exams once or twice a year.  Routine eye exams. Ask your health care provider how often you should have your eyes checked.  Personal lifestyle choices, including:  Daily care of your teeth and gums.  Regular physical activity.  Eating a healthy diet.  Avoiding tobacco and drug use.  Limiting alcohol use.  Practicing safe sex.  Taking low doses of aspirin every day.  Taking vitamin and mineral supplements as recommended by your health care provider. What happens during an annual well check? The services and screenings done by your health care provider during your annual well check will depend  on your age, overall health, lifestyle risk factors, and family history of disease. Counseling  Your health care provider may ask you questions about your:  Alcohol use.  Tobacco use.  Drug use.  Emotional well-being.  Home and relationship well-being.  Sexual activity.  Eating habits.  History of falls.  Memory and ability to understand (cognition).  Work and work Statistician. Screening  You may have the following tests or measurements:  Height, weight, and BMI.  Blood pressure.  Lipid and cholesterol levels. These may be checked every 5 years, or more frequently if you are over 45 years old.  Skin check.  Lung cancer screening. You may have this screening every year starting at age 52 if you have a 30-pack-year history of smoking and currently smoke or have quit within the past 15 years.  Fecal occult blood test (FOBT) of the stool. You may have this test every year starting at age 61.  Flexible sigmoidoscopy or colonoscopy. You may have a sigmoidoscopy every 5 years or a colonoscopy every 10 years starting at age 28.  Prostate cancer screening. Recommendations will vary depending on your family history and other risks.  Hepatitis C blood test.  Hepatitis B blood test.  Sexually transmitted disease (STD) testing.  Diabetes screening. This is done by checking your blood sugar (glucose) after you have not eaten for a while (fasting). You may have this done every 1-3 years.  Abdominal aortic aneurysm (AAA) screening. You may need this if you are a current or former smoker.  Osteoporosis. You may be screened starting at age 57 if you are at high risk. Talk with your health care provider about your test results, treatment options, and if  necessary, the need for more tests. Vaccines  Your health care provider may recommend certain vaccines, such as:  Influenza vaccine. This is recommended every year.  Tetanus, diphtheria, and acellular pertussis (Tdap, Td)  vaccine. You may need a Td booster every 10 years.  Zoster vaccine. You may need this after age 68.  Pneumococcal 13-valent conjugate (PCV13) vaccine. One dose is recommended after age 98.  Pneumococcal polysaccharide (PPSV23) vaccine. One dose is recommended after age 90. Talk to your health care provider about which screenings and vaccines you need and how often you need them. This information is not intended to replace advice given to you by your health care provider. Make sure you discuss any questions you have with your health care provider. Document Released: 09/01/2015 Document Revised: 04/24/2016 Document Reviewed: 06/06/2015 Elsevier Interactive Patient Education  2017 [place] Prevention in the Home Falls can cause injuries. They can happen to people of all ages. There are many things you can do to make your home safe and to help prevent falls. What can I do on the outside of my home?  Regularly fix the edges of walkways and driveways and fix any cracks.  Remove anything that might make you trip as you walk through a door, such as a raised step or threshold.  Trim any bushes or trees on the path to your home.  Use bright outdoor lighting.  Clear any walking paths of anything that might make someone trip, such as rocks or tools.  Regularly check to see if handrails are loose or broken. Make sure that both sides of any steps have handrails.  Any raised decks and porches should have guardrails on the edges.  Have any leaves, snow, or ice cleared regularly.  Use sand or salt on walking paths during winter.  Clean up any spills in your garage right away. This includes oil or grease spills. What can I do in the bathroom?  Use night lights.  Install grab bars by the toilet and in the tub and shower. Do not use towel bars as grab bars.  Use non-skid mats or decals in the tub or shower.  If you need to sit down in the shower, use a plastic, non-slip stool.   Keep the floor dry. Clean up any water that spills on the floor as soon as it happens.  Remove soap buildup in the tub or shower regularly.  Attach bath mats securely with double-sided non-slip rug tape.  Do not have throw rugs and other things on the floor that can make you trip. What can I do in the bedroom?  Use night lights.  Make sure that you have a light by your bed that is easy to reach.  Do not use any sheets or blankets that are too big for your bed. They should not hang down onto the floor.  Have a firm chair that has side arms. You can use this for support while you get dressed.  Do not have throw rugs and other things on the floor that can make you trip. What can I do in the kitchen?  Clean up any spills right away.  Avoid walking on wet floors.  Keep items that you use a lot in easy-to-reach places.  If you need to reach something above you, use a strong step stool that has a grab bar.  Keep electrical cords out of the way.  Do not use floor polish or wax that makes floors slippery. If you must  use wax, use non-skid floor wax.  Do not have throw rugs and other things on the floor that can make you trip. What can I do with my stairs?  Do not leave any items on the stairs.  Make sure that there are handrails on both sides of the stairs and use them. Fix handrails that are broken or loose. Make sure that handrails are as long as the stairways.  Check any carpeting to make sure that it is firmly attached to the stairs. Fix any carpet that is loose or worn.  Avoid having throw rugs at the top or bottom of the stairs. If you do have throw rugs, attach them to the floor with carpet tape.  Make sure that you have a light switch at the top of the stairs and the bottom of the stairs. If you do not have them, ask someone to add them for you. What else can I do to help prevent falls?  Wear shoes that:  Do not have high heels.  Have rubber bottoms.  Are  comfortable and fit you well.  Are closed at the toe. Do not wear sandals.  If you use a stepladder:  Make sure that it is fully opened. Do not climb a closed stepladder.  Make sure that both sides of the stepladder are locked into place.  Ask someone to hold it for you, if possible.  Clearly mark and make sure that you can see:  Any grab bars or handrails.  First and last steps.  Where the edge of each step is.  Use tools that help you move around (mobility aids) if they are needed. These include:  Canes.  Walkers.  Scooters.  Crutches.  Turn on the lights when you go into a dark area. Replace any light bulbs as soon as they burn out.  Set up your furniture so you have a clear path. Avoid moving your furniture around.  If any of your floors are uneven, fix them.  If there are any pets around you, be aware of where they are.  Review your medicines with your doctor. Some medicines can make you feel dizzy. This can increase your chance of falling. Ask your doctor what other things that you can do to help prevent falls. This information is not intended to replace advice given to you by your health care provider. Make sure you discuss any questions you have with your health care provider. Document Released: 06/01/2009 Document Revised: 01/11/2016 Document Reviewed: 09/09/2014 Elsevier Interactive Patient Education  2017 [employer].

## 2019-07-01 NOTE — Progress Notes (Signed)
Subjective:   Jared Cline is a 73 y.o. male who presents for an Initial Medicare Annual Wellness Visit.  Review of Systems   Cardiac Risk Factors include: advanced age (>24men, >67 women);male gender;dyslipidemia;hypertension;obesity (BMI >30kg/m2)    Objective:    Today's Vitals   07/01/19 0847  BP: (!) 144/78  Pulse: 69  Resp: 16  Temp: (!) 96.8 F (36 C)  TempSrc: Temporal  SpO2: 99%  Weight: 233 lb 11.2 oz (106 kg)  Height: 6' (1.829 m)   Body mass index is 31.7 kg/m.  Advanced Directives 07/01/2019 06/09/2017 11/04/2016 03/05/2016  Does Patient Have a Medical Advance Directive? No No No Yes  Type of Advance Directive - - - Living will  Copy of [place] in Chart? - - - No - copy requested  Would patient like information on creating a medical advance directive? No - Patient declined - - -    Current Medications (verified) Outpatient Encounter Medications as of 07/01/2019  Medication Sig  . aspirin EC 81 MG tablet Take 81 mg by mouth daily.  . cloNIDine (CATAPRES) 0.3 MG tablet Take 1 tablet (0.3 mg total) by mouth 2 (two) times daily.  . hydrochlorothiazide (HYDRODIURIL) 25 MG tablet TAKE 1 TABLET BY MOUTH EVERY DAY  . magnesium oxide (MAG-OX) 400 MG tablet Take 400 mg by mouth daily.  . meloxicam (MOBIC) 15 MG tablet Take 15 mg by mouth daily.  . Potassium Gluconate 550 MG TABS Take 550 mg by mouth daily.  . pravastatin (PRAVACHOL) 40 MG tablet Take 1 tablet by mouth daily.  . [DISCONTINUED] meloxicam (MOBIC) 7.5 MG tablet Take 1 tablet (7.5 mg total) by mouth daily.  . [DISCONTINUED] pravastatin (PRAVACHOL) 20 MG tablet Take 20 mg by mouth at bedtime.   No facility-administered encounter medications on file as of 07/01/2019.     Allergies (verified) Flomax [tamsulosin hcl], Ace inhibitors, Amlodipine, Doxazosin, and Lipitor [atorvastatin]   History: Past Medical History:  Diagnosis Date  . BPH (benign prostatic hypertrophy)   .  Bradycardia    evaluated by Dr. Nehemiah Massed  . Cancer [facility])    prostate  . Carpal tunnel syndrome, left    left arm  . CKD (chronic kidney disease) stage 2, GFR 60-89 ml/min   . Diverticulitis   . History of prostate cancer   . HNP (herniated nucleus pulposus), lumbar   . Hyperlipidemia   . Hypertension   . IBS (irritable bowel syndrome)   . Impotence   . Multiple lung nodules    nonspecific, largest 7mm in Jan 2016; scanned March 2017; rescan 3-6 months later  . OA (osteoarthritis) of knee    right  . Renal mass    evaluated; followed by urologist  . Sleep apnea    on CPAP  . Torn meniscus    x 2; right knee   Past Surgical History:  Procedure Laterality Date  . abdominal ultrasound  Oct 2015   Negative for eval for AAA  . COLONOSCOPY    . KNEE ARTHROSCOPY Right    x 2  . LUMBAR LAMINECTOMY    . PROSTATECTOMY     robotic   Family History  Problem Relation Age of Onset  . Stroke Mother   . Hypertension Mother   . Aneurysm Father        cerebral  . Hypertension Father   . Heart disease Paternal Uncle   . Alcohol abuse Brother   . Cancer Neg Hx   . COPD  Neg Hx   . Diabetes Neg Hx    Social History   Socioeconomic History  . Marital status: Married    Spouse name: Not on file  . Number of children: 3  . Years of education: Not on file  . Highest education level: Not on file  Occupational History  . Occupation: retired city of [employer]  . Financial resource strain: Not hard at all  . Food insecurity    Worry: Never true    Inability: Never true  . Transportation needs    Medical: No    Non-medical: No  Tobacco Use  . Smoking status: Former Smoker    Packs/day: 0.50    Years: 10.00    Pack years: 5.00    Types: Cigarettes    Quit date: 08/20/1971    Years since quitting: 47.8  . Smokeless tobacco: Never Used  Substance and Sexual Activity  . Alcohol use: No    Alcohol/week: 0.0 standard drinks  . Drug use: No  . Sexual activity:  Not Currently  Lifestyle  . Physical activity    Days per week: 3 days    Minutes per session: 60 min  . Stress: Not on file  Relationships  . Social Herbalist on phone: Patient refused    Gets together: Patient refused    Attends religious service: Patient refused    Active member of club or organization: Patient refused    Attends meetings of clubs or organizations: Patient refused    Relationship status: Married  Other Topics Concern  . Not on file  Social History Narrative   Retired from city of [employer] 2012.   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    Tobacco Counseling Counseling given: Not Answered   Clinical Intake:  Pre-visit preparation completed: Yes  Pain : No/denies pain     BMI - recorded: 31.7 Nutritional Status: BMI > 30  Obese Nutritional Risks: None Diabetes: No  How often do you need to have someone help you when you read instructions, pamphlets, or other written materials from your doctor or pharmacy?: 1 - Never  Interpreter Needed?: No  Information entered by :: Clemetine Marker LPN  Activities of Daily Living In your present state of health, do you have any difficulty performing the following activities: 07/01/2019 05/25/2019  Hearing? N N  Comment declines hearing aids -  Vision? N N  Difficulty concentrating or making decisions? N N  Walking or climbing stairs? N N  Dressing or bathing? N N  Doing errands, shopping? N N  Preparing Food and eating ? N -  Using the Toilet? N -  In the past six months, have you accidently leaked urine? N -  Do you have problems with loss of bowel control? N -  Managing your Medications? N -  Managing your Finances? N -  Housekeeping or managing your Housekeeping? N -  Some recent data might be hidden     Immunizations and Health Maintenance Immunization History  Administered Date(s) Administered  . Fluad Quad(high Dose 65+) 05/25/2019  . Influenza, High Dose Seasonal PF  06/09/2017, 06/11/2018  . Pneumococcal Conjugate-13 06/09/2017  . Pneumococcal Polysaccharide-23 05/21/2011  . Td 08/19/2006   Health Maintenance Due  Topic Date Due  . Hepatitis C Screening  1945-12-29    Patient Care Team: Delsa Grana, PA-C as PCP - General (Family Medicine) Corey Skains, MD as Consulting Physician (Cardiology) Royston Cowper, MD (Urology)  Indicate any recent Medical Services you may have received from other than [facility] providers in the past year (date may be approximate).    Assessment:   This is a routine wellness examination for [place].  Hearing/Vision screen  Hearing Screening   125Hz  250Hz  500Hz  1000Hz  2000Hz  3000Hz  4000Hz  6000Hz  8000Hz   Right ear:           Left ear:           Comments: Pt denies hearing difficulty  Vision Screening Comments: Past due for vision screening, established at [facility] care  Dietary issues and exercise activities discussed: Current Exercise Habits: Home exercise routine, Type of exercise: walking, Time (Minutes): 60, Frequency (Times/Week): 3, Weekly Exercise (Minutes/Week): 180, Intensity: Moderate, Exercise limited by: orthopedic condition(s)  Goals    . Weight (lb) < 220 lb (99.8 kg)     Pt states he would like to lose weight over the next year with diet and exercise       Depression Screen PHQ 2/9 Scores 07/01/2019 05/25/2019 03/08/2019 02/08/2019  PHQ - 2 Score 0 0 0 0  PHQ- 9 Score - 0 0 0    Fall Risk Fall Risk  07/01/2019 05/25/2019 03/08/2019 02/08/2019 06/11/2018  Falls in the past year? 0 0 0 0 No  Number falls in past yr: 0 - 0 0 -  Injury with Fall? 0 0 0 0 -  Follow up Falls prevention discussed - - - -   FALL RISK PREVENTION PERTAINING TO THE HOME:  Any stairs in or around the home? Yes  If so, do they handrails? Yes   Home free of loose throw rugs in walkways, pet beds, electrical cords, etc? Yes  Adequate lighting in your home to reduce risk of falls? Yes   ASSISTIVE DEVICES UTILIZED  TO PREVENT FALLS:  Life alert? No  Use of a cane, walker or w/c? No  Grab bars in the bathroom? No  Shower chair or bench in shower? Yes  Elevated toilet seat or a handicapped toilet? No   DME ORDERS:  DME order needed?  No   TIMED UP AND GO:  Was the test performed? Yes .  Length of time to ambulate 10 feet: 5 sec.   GAIT:  Appearance of gait: Gait stead-fast and without the use of an assistive device.  Education: Fall risk prevention has been discussed.  Intervention(s) required? No   Cognitive Function: 6CIT deferred for 2020 AWV. Pt has no memory issues.        Screening Tests Health Maintenance  Topic Date Due  . Hepatitis C Screening  12/14/1945  . COLONOSCOPY  12/24/2020  . TETANUS/TDAP  10/17/2023  . INFLUENZA VACCINE  Completed  . PNA vac Low Risk Adult  Completed    Qualifies for Shingles Vaccine? Yes  . Due for Shingrix. Education has been provided regarding the importance of this vaccine. Pt has been advised to call insurance company to determine out of pocket expense. Advised may also receive vaccine at local pharmacy or Health Dept. Verbalized acceptance and understanding.  Tdap: Up to date  Flu Vaccine: Up to date  Pneumococcal Vaccine: Up to date  Cancer Screenings:  Colorectal Screening: Completed 12/25/15. Repeat every 5 years;   Lung Cancer Screening: (Low Dose CT Chest recommended if Age 74-80 years, 30 pack-year currently smoking OR have quit w/in 15years.) does not qualify.   Additional Screening:  Hepatitis C Screening: does qualify; postponed  Vision Screening: Recommended annual ophthalmology exams for early detection  of glaucoma and other disorders of the eye. Is the patient up to date with their annual eye exam?  No  Who is the provider or what is the name of the office in which the pt attends annual eye exams? [place] Screening: Recommended annual dental exams for proper oral hygiene  Community Resource  Referral:  CRR required this visit?  No       Plan:    I have personally reviewed and addressed the Medicare Annual Wellness questionnaire and have noted the following in the patient's chart:  A. Medical and social history B. Use of alcohol, tobacco or illicit drugs  C. Current medications and supplements D. Functional ability and status E.  Nutritional status F.  Physical activity G. Advance directives H. List of other physicians I.  Hospitalizations, surgeries, and ER visits in previous 12 months J.  [place] such as hearing and vision if needed, cognitive and depression L. Referrals and appointments   In addition, I have reviewed and discussed with patient certain preventive protocols, quality metrics, and best practice recommendations. A written personalized care plan for preventive services as well as general preventive health recommendations were provided to patient.   Signed,  Clemetine Marker, LPN Nurse Health Advisor   Nurse Notes: pt doing well and appreciative of visit today

## 2019-07-06 DIAGNOSIS — H524 Presbyopia: Secondary | ICD-10-CM | POA: Diagnosis not present

## 2019-07-19 DIAGNOSIS — M170 Bilateral primary osteoarthritis of knee: Secondary | ICD-10-CM | POA: Diagnosis not present

## 2019-09-02 ENCOUNTER — Other Ambulatory Visit: Payer: Self-pay | Admitting: Family Medicine

## 2019-09-09 ENCOUNTER — Ambulatory Visit: Payer: Medicare Other | Admitting: Family Medicine

## 2019-09-09 ENCOUNTER — Encounter: Payer: Self-pay | Admitting: Family Medicine

## 2019-09-09 ENCOUNTER — Other Ambulatory Visit: Payer: Self-pay

## 2019-09-09 VITALS — BP 146/76 | HR 88 | Temp 97.9°F | Resp 16 | Ht 72.0 in | Wt 237.9 lb

## 2019-09-09 DIAGNOSIS — G4733 Obstructive sleep apnea (adult) (pediatric): Secondary | ICD-10-CM

## 2019-09-09 DIAGNOSIS — E782 Mixed hyperlipidemia: Secondary | ICD-10-CM

## 2019-09-09 DIAGNOSIS — I700 Atherosclerosis of aorta: Secondary | ICD-10-CM | POA: Diagnosis not present

## 2019-09-09 DIAGNOSIS — G5602 Carpal tunnel syndrome, left upper limb: Secondary | ICD-10-CM

## 2019-09-09 DIAGNOSIS — I10 Essential (primary) hypertension: Secondary | ICD-10-CM

## 2019-09-09 DIAGNOSIS — N182 Chronic kidney disease, stage 2 (mild): Secondary | ICD-10-CM | POA: Diagnosis not present

## 2019-09-09 DIAGNOSIS — Z9989 Dependence on other enabling machines and devices: Secondary | ICD-10-CM

## 2019-09-09 DIAGNOSIS — M1711 Unilateral primary osteoarthritis, right knee: Secondary | ICD-10-CM

## 2019-09-09 NOTE — Progress Notes (Signed)
Name: Jared Cline   MRN: [id number]    DOB: 1945/09/11   Date:09/09/2019       Progress Note  Subjective  Chief Complaint  Chief Complaint  Patient presents with  . Hypertension    follow up  . Hyperlipidemia    HPI  Hypertension/CKD Patient is on hydrochlorothiazide 25mg  daily and clonidine 0.3mg  BID; was also recently put on Losartan 25mg  by cardiology.  Takes medications as prescribed with no missed doses a month.  He is compliant with low-salt diet.  Denies chest pain, headaches, blurry vision, lightheadedness, dizziness, BLE edema. He sees Dr. Nehemiah Massed- cardiologist twice a year - last visit was March 2020  Hyperlipidemia Patient rx pravastatin 20mg  every night Takes medications as prescribed with no missed doses a month.  Diet: eats fruits daily, vegetables at least every other day. Has red meat rarely just a few times a month. Fried foods maybe twice a week.  Has atherosclerosis - discussed risk of MI/stroke. Denies myalgias or chest pain.  Obstructive Sleep apnea CPAP 100% self reported compliance - got brand new machine last week and it is working well for him.  OA of the right knee: Seeing Dr. Sabra Heck, had steroid injection, wore a brace for a few weeks and took meloxicam, doing well now.   No weakness, no giving out, no residual pain.   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  He had nerve conduction study done and made some ergonomic adjustments.  Never had surgery, still has occasional numbness in the right hand, but overall pain is controlled.   Patient Active Problem List   Diagnosis Date Noted  . LVH (left ventricular hypertrophy) due to hypertensive disease, without heart failure 05/06/2018  . First degree AV block 04/15/2018  . Medication monitoring encounter 06/09/2017  . Coronary artery calcification seen on CAT scan 03/20/2017  . OSA on CPAP 11/22/2015  . Aortic atherosclerosis ([place]) 10/25/2015    . Right renal mass 10/19/2015  . Hyperlipidemia   . Hypertension   . CKD (chronic kidney disease) stage 2, GFR 60-89 ml/min   . Carpal tunnel syndrome, left   . History of prostate cancer   . Multiple lung nodules     Past Surgical History:  Procedure Laterality Date  . abdominal ultrasound  Oct 2015   Negative for eval for AAA  . COLONOSCOPY    . KNEE ARTHROSCOPY Right    x 2  . LUMBAR LAMINECTOMY    . PROSTATECTOMY     robotic    Family History  Problem Relation Age of Onset  . Stroke Mother   . Hypertension Mother   . Aneurysm Father        cerebral  . Hypertension Father   . Heart disease Paternal Uncle   . Alcohol abuse Brother   . Cancer Neg Hx   . COPD Neg Hx   . Diabetes Neg Hx     Social History   Socioeconomic History  . Marital status: Married    Spouse name: Not on file  . Number of children: 3  . Years of education: Not on file  . Highest education level: Not on file  Occupational History  . Occupation: retired city of [employer]  . Smoking status: Former Smoker    Packs/day: 0.50    Years: 10.00    Pack years: 5.00    Types: Cigarettes    Quit date: 08/20/1971  Years since quitting: 48.0  . Smokeless tobacco: Never Used  Substance and Sexual Activity  . Alcohol use: No    Alcohol/week: 0.0 standard drinks  . Drug use: No  . Sexual activity: Not Currently  Other Topics Concern  . Not on file  Social History Narrative   Retired from city of [employer] 2012.   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    Social Determinants of Health   Financial Resource Strain: Low Risk   . Difficulty of Paying Living Expenses: Not hard at all  Food Insecurity: No Food Insecurity  . Worried About Charity fundraiser in the Last Year: Never true  . Ran Out of Food in the Last Year: Never true  Transportation Needs: No Transportation Needs  . Lack of Transportation (Medical): No  . Lack of Transportation (Non-Medical): No   Physical Activity: Sufficiently Active  . Days of Exercise per Week: 3 days  . Minutes of Exercise per Session: 60 min  Stress:   . Feeling of Stress : Not on file  Social Connections: Unknown  . Frequency of Communication with Friends and Family: Patient refused  . Frequency of Social Gatherings with Friends and Family: Patient refused  . Attends Religious Services: Patient refused  . Active Member of Clubs or Organizations: Patient refused  . Attends Archivist Meetings: Patient refused  . Marital Status: Married  Human resources officer Violence: Not At Risk  . Fear of Current or Ex-Partner: No  . Emotionally Abused: No  . Physically Abused: No  . Sexually Abused: No     Current Outpatient Medications:  .  aspirin EC 81 MG tablet, Take 81 mg by mouth daily., Disp: , Rfl:  .  cloNIDine (CATAPRES) 0.3 MG tablet, Take 1 tablet (0.3 mg total) by mouth 2 (two) times daily., Disp: 180 tablet, Rfl: 1 .  hydrochlorothiazide (HYDRODIURIL) 25 MG tablet, TAKE 1 TABLET BY MOUTH EVERY DAY, Disp: 90 tablet, Rfl: 0 .  losartan (COZAAR) 25 MG tablet, Take 25 mg by mouth daily., Disp: , Rfl:  .  magnesium oxide (MAG-OX) 400 MG tablet, Take 400 mg by mouth daily., Disp: , Rfl:  .  meloxicam (MOBIC) 15 MG tablet, Take 15 mg by mouth daily., Disp: , Rfl:  .  Potassium Gluconate 550 MG TABS, Take 550 mg by mouth daily., Disp: , Rfl:  .  pravastatin (PRAVACHOL) 40 MG tablet, Take 1 tablet by mouth daily., Disp: , Rfl:   Allergies  Allergen Reactions  . Flomax [Tamsulosin Hcl] Shortness Of Breath  . Ace Inhibitors Other (See Comments)    Angioedema  . Amlodipine Other (See Comments)    Angioedema  . Doxazosin   . Lipitor [Atorvastatin] Other (See Comments)    Memory loss    I personally reviewed active problem list, medication list, allergies, notes from last encounter, lab results with the patient/caregiver today.   ROS  Constitutional: Negative for fever or weight change.   Respiratory: Negative for cough and shortness of breath.   Cardiovascular: Negative for chest pain or palpitations.  Gastrointestinal: Negative for abdominal pain, no bowel changes.  Musculoskeletal: Negative for gait problem or joint swelling.  Skin: Negative for rash.  Neurological: Negative for dizziness or headache.  No other specific complaints in a complete review of systems (except as listed in HPI above).  Objective  Vitals:   09/09/19 0855  BP: (!) 146/76  Pulse: 88  Resp: 16  Temp: 97.9 F (36.6 C)  TempSrc: Temporal  SpO2: 97%  Weight: 237 lb 14.4 oz (107.9 kg)  Height: 6' (1.829 m)    Body mass index is 32.27 kg/m.  Physical Exam Constitutional: Patient appears well-developed and well-nourished. No distress.  HENT: Head: Normocephalic and atraumatic.  Eyes: Conjunctivae and EOM are normal. No scleral icterus.  Neck: Normal range of motion. Neck supple. No JVD present. Cardiovascular: Normal rate, regular rhythm and normal heart sounds.  No murmur heard. No BLE edema. Pulmonary/Chest: Effort normal and breath sounds normal. No respiratory distress. Musculoskeletal: Normal range of motion, no joint effusions. No gross deformities Neurological: Pt is alert and oriented to person, place, and time. No cranial nerve deficit. Coordination, balance, strength, speech and gait are normal.  Skin: Skin is warm and dry. No rash noted. No erythema.  Psychiatric: Patient has a normal mood and affect. behavior is normal. Judgment and thought content normal.  No results found for this or any previous visit (from the past 72 hour(s)).  PHQ2/9: Depression screen [facility] 2/9 09/09/2019 07/01/2019 05/25/2019 03/08/2019 02/08/2019  Decreased Interest 0 0 0 0 0  Down, Depressed, Hopeless 0 0 0 0 0  PHQ - 2 Score 0 0 0 0 0  Altered sleeping 0 - 0 0 0  Tired, decreased energy 0 - 0 0 0  Change in appetite 0 - 0 0 0  Feeling bad or failure about yourself  0 - 0 0 0  Trouble concentrating  0 - 0 0 0  Moving slowly or fidgety/restless 0 - 0 0 0  Suicidal thoughts 0 - - 0 0  PHQ-9 Score 0 - 0 0 0  Difficult doing work/chores Not difficult at all - Not difficult at all Not difficult at all Not difficult at all   PHQ-2/9 Result is negative.    Fall Risk: Fall Risk  09/09/2019 07/01/2019 05/25/2019 03/08/2019 02/08/2019  Falls in the past year? 0 0 0 0 0  Number falls in past yr: 0 0 - 0 0  Injury with Fall? 0 0 0 0 0  Follow up Falls evaluation completed Falls prevention discussed - - -   Assessment & Plan  1. Essential hypertension - Taking medications as prescribed; fasting today and BP is just above normal range.  Will take medications upon return home. - COMPLETE METABOLIC PANEL WITH GFR  2. Aortic atherosclerosis (HCC) - Lipid panel  3. Mixed hyperlipidemia - Lipid panel  4. CKD (chronic kidney disease) stage 2, GFR 60-89 ml/min - NSAID use only PRN for OA of knee, staying well hydrated - COMPLETE METABOLIC PANEL WITH GFR  5. OSA on CPAP - Compliant with CPAP, has new machine, going well  6. Carpal tunnel syndrome, left - Retired, no longer having to do repetitive motions daily, symptoms have improved.  7. Osteoarthritis of right knee, unspecified osteoarthritis type - Seeing orthopedist PRN, doing well since last knee injection.

## 2019-09-10 LAB — COMPLETE METABOLIC PANEL WITH GFR
AG Ratio: 1.7 (calc) (ref 1.0–2.5)
ALT: 10 U/L (ref 9–46)
AST: 14 U/L (ref 10–35)
Albumin: 4.3 g/dL (ref 3.6–5.1)
Alkaline phosphatase (APISO): 55 U/L (ref 35–144)
BUN/Creatinine Ratio: 13 (calc) (ref 6–22)
BUN: 17 mg/dL (ref 7–25)
CO2: 27 mmol/L (ref 20–32)
Calcium: 9.9 mg/dL (ref 8.6–10.3)
Chloride: 101 mmol/L (ref 98–110)
Creat: 1.32 mg/dL — ABNORMAL HIGH (ref 0.70–1.18)
GFR, Est African American: 61 mL/min/{1.73_m2} (ref >=60)
GFR, Est Non African American: 53 mL/min/{1.73_m2} — ABNORMAL LOW (ref >=60)
Globulin: 2.5 g/dL (calc) (ref 1.9–3.7)
Glucose, Bld: 89 mg/dL (ref 65–99)
Potassium: 4.5 mmol/L (ref 3.5–5.3)
Sodium: 136 mmol/L (ref 135–146)
Total Bilirubin: 0.6 mg/dL (ref 0.2–1.2)
Total Protein: 6.8 g/dL (ref 6.1–8.1)

## 2019-09-10 LAB — LIPID PANEL
Cholesterol: 167 mg/dL (ref <200)
HDL: 54 mg/dL (ref >=40)
LDL Cholesterol (Calc): 99 mg/dL (calc)
Non-HDL Cholesterol (Calc): 113 mg/dL (calc) (ref <130)
Total CHOL/HDL Ratio: 3.1 (calc) (ref <5.0)
Triglycerides: 55 mg/dL (ref <150)

## 2019-09-15 DIAGNOSIS — I10 Essential (primary) hypertension: Secondary | ICD-10-CM | POA: Diagnosis not present

## 2019-09-15 DIAGNOSIS — I2510 Atherosclerotic heart disease of native coronary artery without angina pectoris: Secondary | ICD-10-CM | POA: Diagnosis not present

## 2019-09-15 DIAGNOSIS — N182 Chronic kidney disease, stage 2 (mild): Secondary | ICD-10-CM | POA: Diagnosis not present

## 2019-09-15 DIAGNOSIS — E782 Mixed hyperlipidemia: Secondary | ICD-10-CM | POA: Diagnosis not present

## 2019-11-13 IMAGING — CT CT CHEST W/O CM
2 of 4 series · 15 of 36 positions shown, 18 images · non-contrast
Comparison: Chest CT dated 03/20/2017. Chest CT dated 03/19/2016.
Chest CT dated 11/03/2015.

CLINICAL DATA: Follow-up lung nodule.

EXAM:
CT CHEST WITHOUT CONTRAST
TECHNIQUE: Multidetector CT imaging of the chest was performed following the
standard protocol without IV contrast.

[Series 2: chest · axial · 0.68mm/px · z∈[-1265,-979]mm · 12 of 170 slices shown, 15 images (1 of 2)]
[im 14/170  mediastinal]
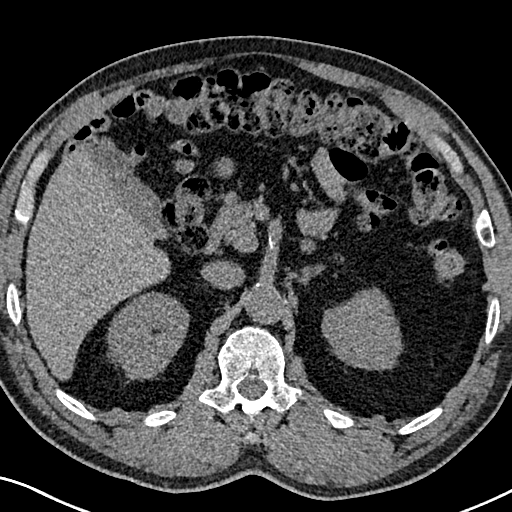
[im 14/170  lung]
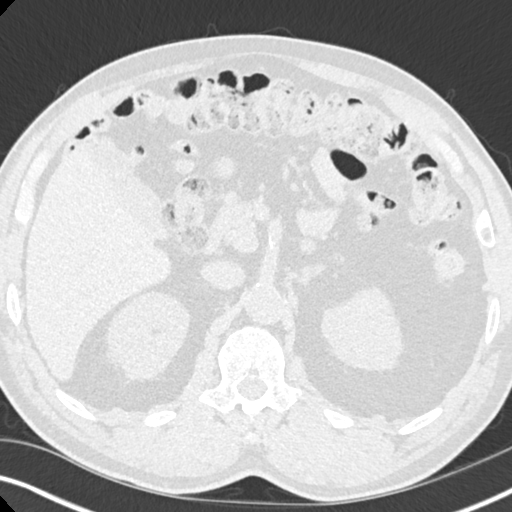
[im 27/170  lung]
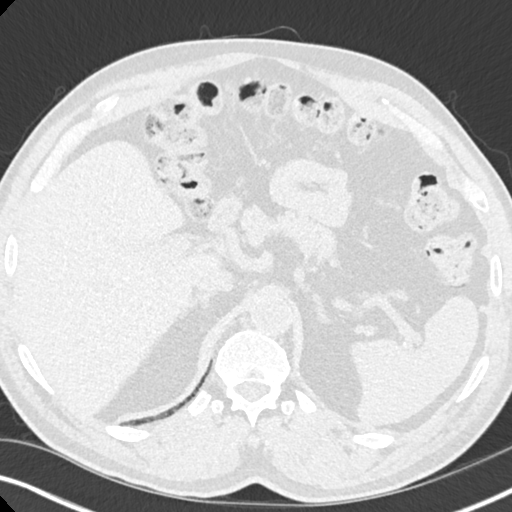
[im 40/170  lung]
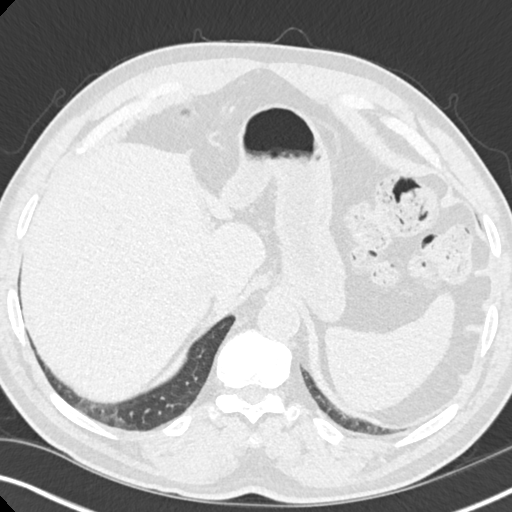
[im 53/170  lung]
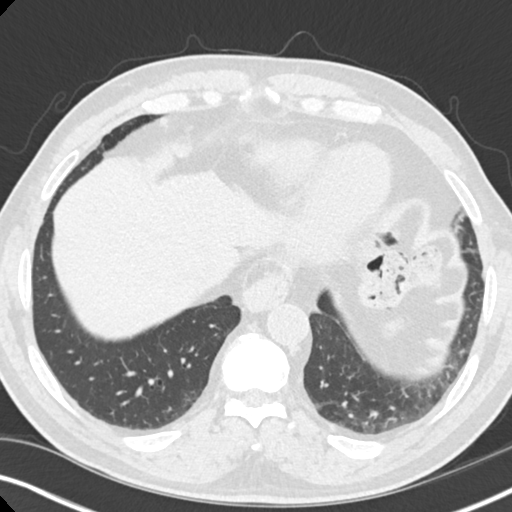
[im 66/170  mediastinal]
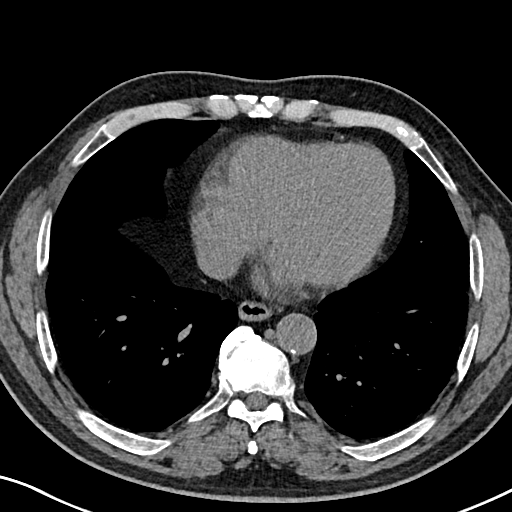
[im 66/170  lung]
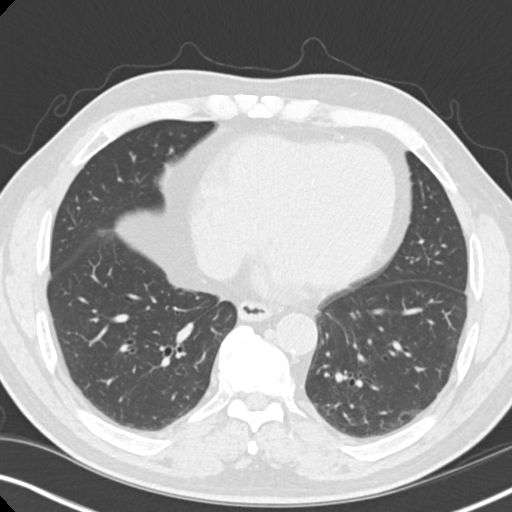
[im 79/170  lung]
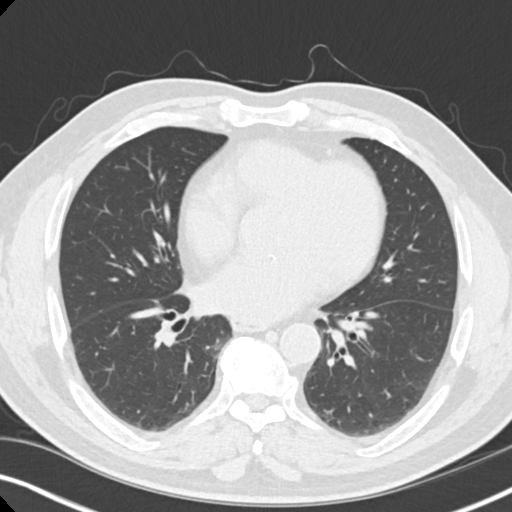
[im 92/170  lung]
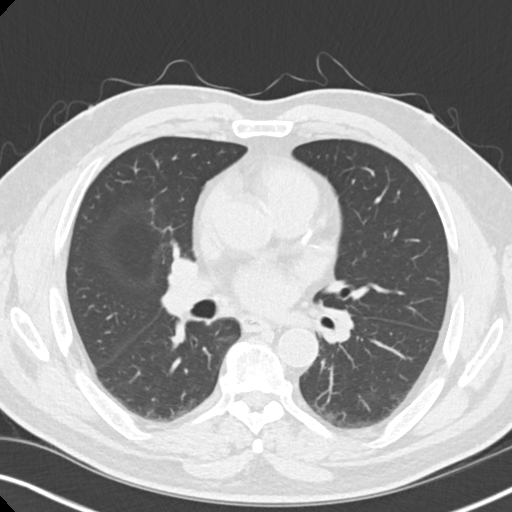
[im 105/170  lung]
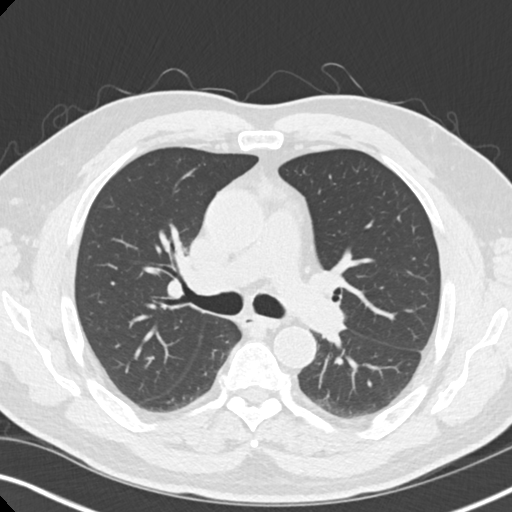
[im 118/170  mediastinal]
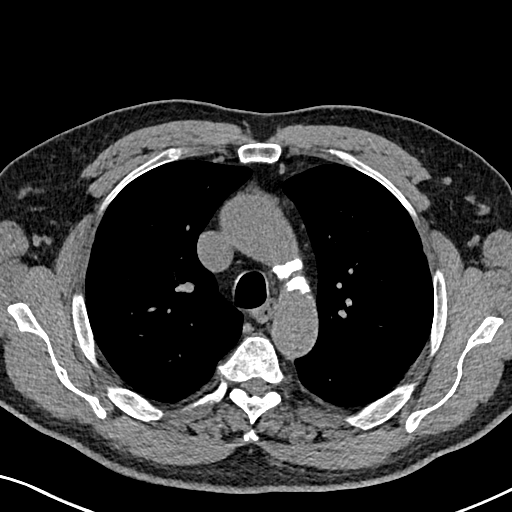
[im 118/170  lung]
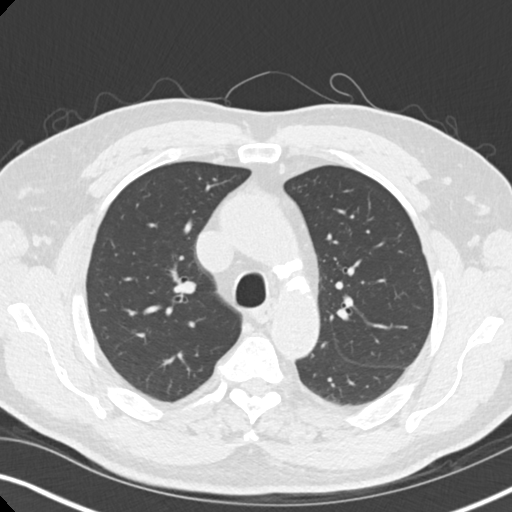
[im 131/170  lung]
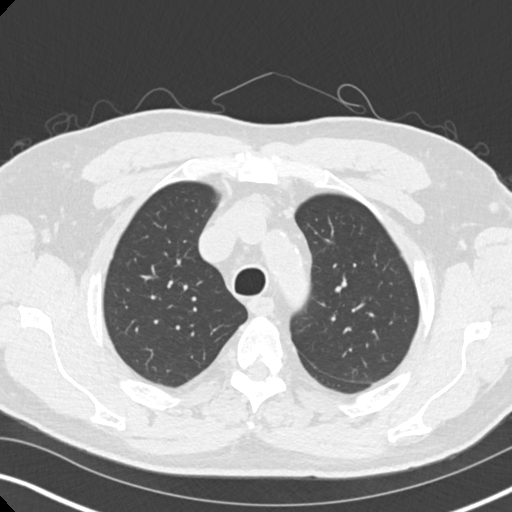
[im 144/170  lung]
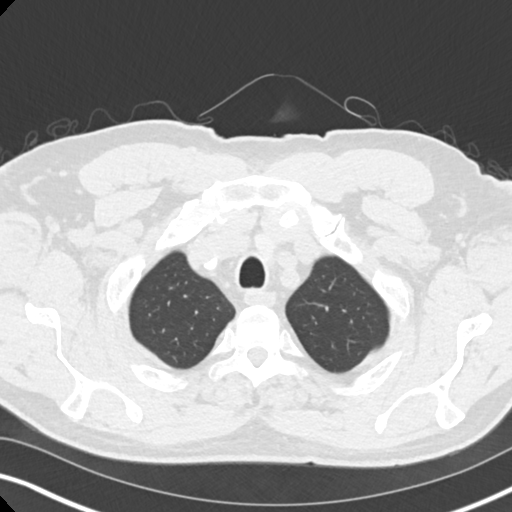
[im 157/170  lung]
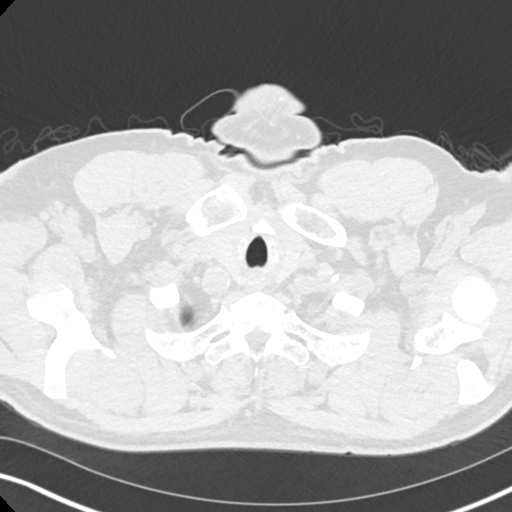

[Series 5: chest · coronal · 0.67mm/px · 3 of 174 slices shown (2 of 2)]
[im 35/174  lung]
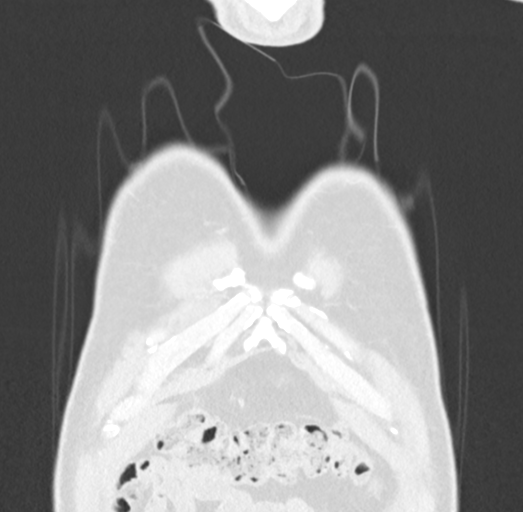
[im 70/174  lung]
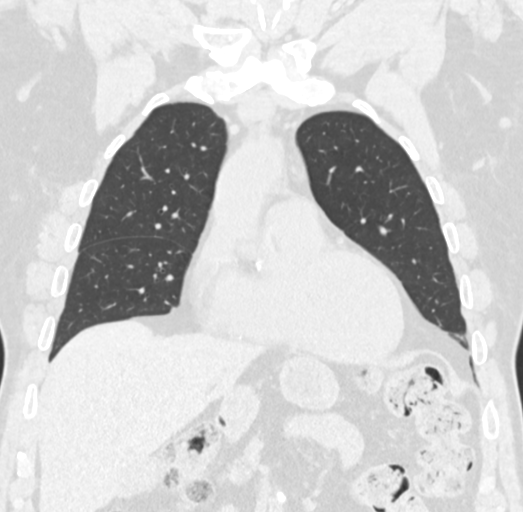
[im 104/174  lung]
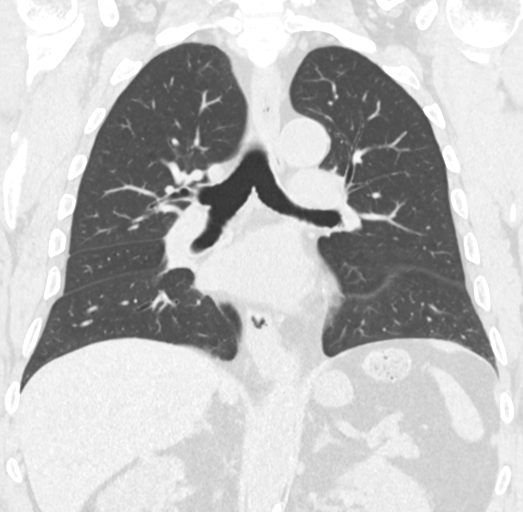

[15 of 36 positions shown; findings below may reference images not displayed]

FINDINGS: Cardiovascular: Heart size appears stable. No pericardial effusion.
Scattered coronary artery calcifications, particularly dense at the
junction of the LEFT main and LEFT circumflex coronary artery.

Aortic atherosclerosis at the arch.  No thoracic aortic aneurysm.

Mediastinum/Nodes: No mass or enlarged lymph nodes seen within the
mediastinum or perihilar regions. Thyroid gland is unremarkable.
Esophagus is unremarkable. Trachea and central bronchi are
unremarkable.

Lungs/Pleura: There is a stable 5 mm pulmonary nodule within the
RIGHT upper lobe. The additional small subpleural nodule adjacent to
the RIGHT major fissure is stable. The additional small pulmonary
nodules within the LEFT lower lobe and lingula are stable. No new
nodules within either lung. No consolidation, pleural effusion or
pneumothorax.

Upper Abdomen: No acute findings within the upper abdomen.

Musculoskeletal: No chest wall mass or suspicious bone lesions
identified.
IMPRESSION: 1. Stable small pulmonary nodules within each lung. These nodules
have been stable for greater than 2 years confirming benignity. No
additional follow-up imaging is necessary.
2. No acute findings.
3. Coronary artery calcifications, particularly dense at the
junction of the LEFT main and LEFT circumflex coronary arteries.
Recommend correlation with any possible associated cardiac symptoms.

Aortic Atherosclerosis ([id number]).

## 2019-12-07 DIAGNOSIS — E782 Mixed hyperlipidemia: Secondary | ICD-10-CM | POA: Diagnosis not present

## 2019-12-07 DIAGNOSIS — I10 Essential (primary) hypertension: Secondary | ICD-10-CM | POA: Diagnosis not present

## 2019-12-07 DIAGNOSIS — I700 Atherosclerosis of aorta: Secondary | ICD-10-CM | POA: Diagnosis not present

## 2019-12-07 DIAGNOSIS — I2510 Atherosclerotic heart disease of native coronary artery without angina pectoris: Secondary | ICD-10-CM | POA: Diagnosis not present

## 2019-12-09 DIAGNOSIS — M170 Bilateral primary osteoarthritis of knee: Secondary | ICD-10-CM | POA: Diagnosis not present

## 2019-12-12 ENCOUNTER — Other Ambulatory Visit: Payer: Self-pay | Admitting: Family Medicine

## 2019-12-12 NOTE — Telephone Encounter (Signed)
Requested Prescriptions  Pending Prescriptions Disp Refills  . hydrochlorothiazide (HYDRODIURIL) 25 MG tablet [Pharmacy Med Name: HYDROCHLOROTHIAZIDE 25MG  TABLETS] 90 tablet 0    Sig: TAKE 1 TABLET BY MOUTH EVERY DAY     Cardiovascular: Diuretics - Thiazide Failed - 12/12/2019  4:05 PM      Failed - Cr in normal range and within 360 days    Creat  Date Value Ref Range Status  09/09/2019 1.32 (H) 0.70 - 1.18 mg/dL Final    Comment:    For patients >59 years of age, the reference limit for Creatinine is approximately 13% higher for people identified as African-American. .          Failed - Last BP in normal range    BP Readings from Last 1 Encounters:  09/09/19 (!) 146/76         Passed - Ca in normal range and within 360 days    Calcium  Date Value Ref Range Status  09/09/2019 9.9 8.6 - 10.3 mg/dL Final   Calcium, Total  Date Value Ref Range Status  08/25/2014 9.1 8.5 - 10.1 mg/dL Final         Passed - K in normal range and within 360 days    Potassium  Date Value Ref Range Status  09/09/2019 4.5 3.5 - 5.3 mmol/L Final  08/25/2014 4.1 3.5 - 5.1 mmol/L Final         Passed - Na in normal range and within 360 days    Sodium  Date Value Ref Range Status  09/09/2019 136 135 - 146 mmol/L Final  10/19/2015 135 134 - 144 mmol/L Final  08/25/2014 133 (L) 136 - 145 mmol/L Final         Passed - Valid encounter within last 6 months    Recent Outpatient Visits          3 months ago Essential hypertension   [facility] [place] Hartshorn, FNP   6 months ago Bakers cyst, right   [facility] Delsa Grana, PA-C   9 months ago Essential hypertension   [place], NP   10 months ago Right calf pain   [place], Bethel Born, NP   1 year ago Essential hypertension   [place], Satira Anis, MD      Future Appointments            In 2 months  Delsa Grana, PA-C [facility] [place]   In 6 months  [facility]

## 2020-01-21 DIAGNOSIS — G4733 Obstructive sleep apnea (adult) (pediatric): Secondary | ICD-10-CM | POA: Diagnosis not present

## 2020-02-09 ENCOUNTER — Other Ambulatory Visit: Payer: Self-pay

## 2020-02-09 MED ORDER — CLONIDINE HCL 0.3 MG PO TABS: 0.3000 mg | ORAL_TABLET | Freq: Two times a day (BID) | ORAL | 1 refills | Status: DC

## 2020-03-07 NOTE — Progress Notes (Signed)
Name: Davidson Palmieri   MRN: [id number]    DOB: [birth year]   Date:03/08/2020       Progress Note  Chief Complaint  Patient presents with  . Follow-up  . Hypertension  . Hyperlipidemia     Subjective:   Elester Apodaca is a 74 y.o. male, presents to clinic for routine f/up  Hypertension/CKD:  Losartan 25 mg, HCTZ 25 mg daily and clonidine 0.3mg  BID Pt reports good med compliance and denies any SE.  No lightheadedness, hypotension, syncope. Blood pressure today is well controlled. BP Readings from Last 3 Encounters:  03/08/20 138/78  09/09/19 (!) 146/76  07/01/19 (!) 144/78   Pt denies CP, SOB, exertional sx, LE edema, palpitation, Ha's, visual disturbances  Dr. Nehemiah Massed- cardiologist twice a year - last visit was 12/07/2019 and next is in october  Hyperlipidemia Currently treated with pravastatin 20 mg, pt reports good med compliance Last Lipids: Lab Results  Component Value Date   CHOL 167 09/09/2019   HDL 54 09/09/2019   LDLCALC 99 09/09/2019   TRIG 55 09/09/2019   CHOLHDL 3.1 09/09/2019   - Denies: Chest pain, shortness of breath, myalgias, claudication He continues to work on diet and lifestyle for HLD  OSA- compliant with CPAP - still sleeping well  Bilateral knee pain worse with sitting in low seats, bakers cyst and going to Dr. Sabra Heck -ortho     Current Outpatient Medications:  .  aspirin EC 81 MG tablet, Take 81 mg by mouth daily., Disp: , Rfl:  .  cloNIDine (CATAPRES) 0.3 MG tablet, Take 1 tablet (0.3 mg total) by mouth 2 (two) times daily., Disp: 180 tablet, Rfl: 1 .  hydrochlorothiazide (HYDRODIURIL) 25 MG tablet, TAKE 1 TABLET BY MOUTH EVERY DAY, Disp: 90 tablet, Rfl: 0 .  losartan (COZAAR) 25 MG tablet, Take 25 mg by mouth daily., Disp: , Rfl:  .  magnesium oxide (MAG-OX) 400 MG tablet, Take 400 mg by mouth daily., Disp: , Rfl:  .  meloxicam (MOBIC) 15 MG tablet, Take 15 mg by mouth daily., Disp: , Rfl:  .  Potassium Gluconate 550 MG TABS, Take 550 mg by  mouth daily., Disp: , Rfl:  .  pravastatin (PRAVACHOL) 40 MG tablet, Take 1 tablet by mouth daily., Disp: , Rfl:   Patient Active Problem List   Diagnosis Date Noted  . LVH (left ventricular hypertrophy) due to hypertensive disease, without heart failure 05/06/2018  . First degree AV block 04/15/2018  . Medication monitoring encounter 06/09/2017  . Coronary artery calcification seen on CAT scan 03/20/2017  . OSA on CPAP 11/22/2015  . Aortic atherosclerosis ([place]) 10/25/2015  . Right renal mass 10/19/2015  . Hyperlipidemia   . Hypertension   . CKD (chronic kidney disease) stage 2, GFR 60-89 ml/min   . Carpal tunnel syndrome, left   . History of prostate cancer   . Multiple lung nodules     Past Surgical History:  Procedure Laterality Date  . abdominal ultrasound  Oct 2015   Negative for eval for AAA  . COLONOSCOPY    . KNEE ARTHROSCOPY Right    x 2  . LUMBAR LAMINECTOMY    . PROSTATECTOMY     robotic    Family History  Problem Relation Age of Onset  . Stroke Mother   . Hypertension Mother   . Aneurysm Father        cerebral  . Hypertension Father   . Heart disease Paternal Uncle   . Alcohol abuse Brother   .  Cancer Neg Hx   . COPD Neg Hx   . Diabetes Neg Hx     Social History   Tobacco Use  . Smoking status: Former Smoker    Packs/day: 0.50    Years: 10.00    Pack years: 5.00    Types: Cigarettes    Quit date: 08/20/1971    Years since quitting: 48.5  . Smokeless tobacco: Never Used  Vaping Use  . Vaping Use: Never used  Substance Use Topics  . Alcohol use: No    Alcohol/week: 0.0 standard drinks  . Drug use: No     Allergies  Allergen Reactions  . Flomax [Tamsulosin Hcl] Shortness Of Breath  . Ace Inhibitors Other (See Comments)    Angioedema  . Amlodipine Other (See Comments)    Angioedema  . Doxazosin   . Lipitor [Atorvastatin] Other (See Comments)    Memory loss    Health Maintenance  Topic Date Due  . Hepatitis C Screening  Never done    . INFLUENZA VACCINE  03/19/2020  . COLONOSCOPY  12/24/2020  . TETANUS/TDAP  10/17/2023  . COVID-19 Vaccine  Completed  . PNA vac Low Risk Adult  Completed    Chart Review Today: I personally reviewed active problem list, medication list, allergies, family history, social history, health maintenance, notes from last encounter, lab results, imaging with the patient/caregiver today.   Review of Systems  10 Systems reviewed and are negative for acute change except as noted in the HPI.  Objective:   Vitals:   03/08/20 0857  BP: 138/78  Pulse: 76  Resp: 18  Temp: 97.8 F (36.6 C)  TempSrc: Temporal  SpO2: 96%  Weight: 235 lb (106.6 kg)  Height: 6' (1.829 m)    Body mass index is 31.87 kg/m.  Physical Exam Vitals and nursing note reviewed.  Constitutional:      General: He is not in acute distress.    Appearance: Normal appearance. He is well-developed. He is not ill-appearing, toxic-appearing or diaphoretic.  HENT:     Head: Normocephalic and atraumatic.     Nose: Nose normal.  Eyes:     General: No scleral icterus.       Right eye: No discharge.        Left eye: No discharge.     Conjunctiva/sclera: Conjunctivae normal.  Neck:     Trachea: No tracheal deviation.  Cardiovascular:     Rate and Rhythm: Normal rate and regular rhythm.     Pulses:          Radial pulses are 2+ on the right side and 2+ on the left side.       Dorsalis pedis pulses are 1+ on the right side and 1+ on the left side.       Posterior tibial pulses are 1+ on the right side and 1+ on the left side.     Heart sounds: Normal heart sounds. No murmur heard.  No friction rub. No gallop.   Pulmonary:     Effort: Pulmonary effort is normal. No respiratory distress.     Breath sounds: Normal breath sounds. No stridor. No wheezing, rhonchi or rales.  Abdominal:     General: Bowel sounds are normal.     Palpations: Abdomen is soft.  Musculoskeletal:     Right knee: No patellar tendon tenderness.      Right lower leg: No edema.     Left lower leg: No edema.  Skin:    General: Skin  is warm and dry.     Coloration: Skin is not pale.     Findings: No bruising, erythema or rash.  Neurological:     Mental Status: He is alert.     Motor: No abnormal muscle tone.     Gait: Abnormal gait: antalgic gait.  Psychiatric:        Mood and Affect: Mood normal.        Behavior: Behavior normal.         Assessment & Plan:     ICD-10-CM   1. Essential hypertension  B16 COMPLETE METABOLIC PANEL WITH GFR   stable, well controlled for age, check labs and continue meds  2. Aortic atherosclerosis (HCC)  I70.0    on statin and ASA   3. LVH (left ventricular hypertrophy) due to hypertensive disease, without heart failure  I11.9    no signs of CHF today, appears euvolemic  4. OSA on CPAP  G47.33    Z99.89    compliant with CPAP, pt endorses sleeping well  5. Mixed hyperlipidemia  O06.0 COMPLETE METABOLIC PANEL WITH GFR    US ARTERIAL ABI (SCREENING LOWER EXTREMITY)   tolerating statin, last lipid panel was controlled, no myalgias or SE, lipids due anually  6. CKD (chronic kidney disease) stage 2, GFR 60-89 ml/min  O45.9 COMPLETE METABOLIC PANEL WITH GFR   monitoring renal function  7. Left leg pain  X77.414 COMPLETE METABOLIC PANEL WITH GFR    Magnesium    US ARTERIAL ABI (SCREENING LOWER EXTREMITY)   screen ABI's, may be related to back pain? r/o electrolyte abnormality or deficiency B12, iron, mag?   8. Hypomagnesemia  E39.53 COMPLETE METABOLIC PANEL WITH GFR    Magnesium   recheck mag level  9. Low back pain with bilateral sciatica, unspecified back pain laterality, unspecified chronicity  M54.42    M54.41    may be contributing to leg pain - may need specialists referral  10. Medication monitoring encounter  U02.33 COMPLETE METABOLIC PANEL WITH GFR    Magnesium     No follow-ups on file.   Delsa Grana, PA-C 03/08/20 9:16 AM

## 2020-03-08 ENCOUNTER — Encounter: Payer: Self-pay | Admitting: Family Medicine

## 2020-03-08 ENCOUNTER — Ambulatory Visit: Payer: Medicare Other | Admitting: Family Medicine

## 2020-03-08 ENCOUNTER — Other Ambulatory Visit: Payer: Self-pay

## 2020-03-08 ENCOUNTER — Telehealth: Payer: Self-pay

## 2020-03-08 VITALS — BP 138/78 | HR 76 | Temp 97.8°F | Resp 18 | Ht 72.0 in | Wt 235.0 lb

## 2020-03-08 DIAGNOSIS — I119 Hypertensive heart disease without heart failure: Secondary | ICD-10-CM | POA: Diagnosis not present

## 2020-03-08 DIAGNOSIS — I10 Essential (primary) hypertension: Secondary | ICD-10-CM

## 2020-03-08 DIAGNOSIS — E782 Mixed hyperlipidemia: Secondary | ICD-10-CM

## 2020-03-08 DIAGNOSIS — Z9989 Dependence on other enabling machines and devices: Secondary | ICD-10-CM

## 2020-03-08 DIAGNOSIS — M79605 Pain in left leg: Secondary | ICD-10-CM | POA: Diagnosis not present

## 2020-03-08 DIAGNOSIS — G4733 Obstructive sleep apnea (adult) (pediatric): Secondary | ICD-10-CM | POA: Diagnosis not present

## 2020-03-08 DIAGNOSIS — M5442 Lumbago with sciatica, left side: Secondary | ICD-10-CM

## 2020-03-08 DIAGNOSIS — Z5181 Encounter for therapeutic drug level monitoring: Secondary | ICD-10-CM

## 2020-03-08 DIAGNOSIS — I700 Atherosclerosis of aorta: Secondary | ICD-10-CM | POA: Diagnosis not present

## 2020-03-08 DIAGNOSIS — N182 Chronic kidney disease, stage 2 (mild): Secondary | ICD-10-CM

## 2020-03-08 DIAGNOSIS — M5441 Lumbago with sciatica, right side: Secondary | ICD-10-CM

## 2020-03-08 LAB — COMPLETE METABOLIC PANEL WITH GFR
AG Ratio: 1.8 (calc) (ref 1.0–2.5)
ALT: 10 U/L (ref 9–46)
AST: 17 U/L (ref 10–35)
Albumin: 4.5 g/dL (ref 3.6–5.1)
Alkaline phosphatase (APISO): 64 U/L (ref 35–144)
BUN/Creatinine Ratio: 14 (calc) (ref 6–22)
BUN: 18 mg/dL (ref 7–25)
CO2: 29 mmol/L (ref 20–32)
Calcium: 9.5 mg/dL (ref 8.6–10.3)
Chloride: 106 mmol/L (ref 98–110)
Creat: 1.3 mg/dL — ABNORMAL HIGH (ref 0.70–1.18)
GFR, Est African American: 62 mL/min/{1.73_m2} (ref >=60)
GFR, Est Non African American: 53 mL/min/{1.73_m2} — ABNORMAL LOW (ref >=60)
Globulin: 2.5 g/dL (calc) (ref 1.9–3.7)
Glucose, Bld: 103 mg/dL — ABNORMAL HIGH (ref 65–99)
Potassium: 4.8 mmol/L (ref 3.5–5.3)
Sodium: 139 mmol/L (ref 135–146)
Total Bilirubin: 0.3 mg/dL (ref 0.2–1.2)
Total Protein: 7 g/dL (ref 6.1–8.1)

## 2020-03-08 LAB — MAGNESIUM: Magnesium: 2.5 mg/dL (ref 1.5–2.5)

## 2020-03-08 NOTE — Telephone Encounter (Signed)
I tried to contact this patient to inform him that he has been scheduled to have his [place] ABI on Monday, July 26th at [place] at [facility]), but there was no answer.  I was not able to leave a message but a CRM will be placed.

## 2020-03-13 ENCOUNTER — Ambulatory Visit
Admission: RE | Admit: 2020-03-13 | Discharge: 2020-03-13 | Disposition: A | Discharge status: Home / Self Care | Payer: Medicare Other | Source: Ambulatory Visit | Attending: Family Medicine | Admitting: Family Medicine

## 2020-03-13 ENCOUNTER — Other Ambulatory Visit: Payer: Self-pay

## 2020-03-13 DIAGNOSIS — E782 Mixed hyperlipidemia: Secondary | ICD-10-CM | POA: Insufficient documentation

## 2020-03-13 DIAGNOSIS — M79605 Pain in left leg: Secondary | ICD-10-CM | POA: Diagnosis not present

## 2020-03-13 DIAGNOSIS — I7092 Chronic total occlusion of artery of the extremities: Secondary | ICD-10-CM | POA: Diagnosis not present

## 2020-03-18 ENCOUNTER — Other Ambulatory Visit: Payer: Self-pay | Admitting: Family Medicine

## 2020-03-20 ENCOUNTER — Telehealth: Payer: Self-pay

## 2020-03-20 DIAGNOSIS — M79605 Pain in left leg: Secondary | ICD-10-CM

## 2020-03-20 DIAGNOSIS — I739 Peripheral vascular disease, unspecified: Secondary | ICD-10-CM

## 2020-03-20 NOTE — Telephone Encounter (Signed)
-----   Message from Delsa Grana, [place] sent at 03/20/2020 10:46 AM EDT ----- ABI show some mild peripheral artery disease - with leg pain, please refer to Vascular specialists for further eval   Dx PAD (please copy this into comments - ABI results: Right: Resting ABI in the mild range arterial occlusive disease. Segmental exam at the ankle demonstrates more proximal occlusive disease. Left: Resting ABI within normal limits. Segmental exam demonstrates posterior tibial arterial occlusive disease with maintained waveform at the dorsalis pedis.) And for Dx of leg pain

## 2020-03-24 ENCOUNTER — Ambulatory Visit (INDEPENDENT_AMBULATORY_CARE_PROVIDER_SITE_OTHER): Payer: Medicare Other | Admitting: Internal Medicine

## 2020-03-24 ENCOUNTER — Other Ambulatory Visit: Payer: Self-pay

## 2020-03-24 ENCOUNTER — Encounter: Payer: Self-pay | Admitting: Internal Medicine

## 2020-03-24 VITALS — BP 118/60 | HR 80 | Temp 98.0°F | Resp 16 | Ht 72.0 in | Wt 235.0 lb

## 2020-03-24 DIAGNOSIS — M79605 Pain in left leg: Secondary | ICD-10-CM | POA: Diagnosis not present

## 2020-03-24 DIAGNOSIS — M79604 Pain in right leg: Secondary | ICD-10-CM | POA: Insufficient documentation

## 2020-03-24 DIAGNOSIS — I739 Peripheral vascular disease, unspecified: Secondary | ICD-10-CM | POA: Diagnosis not present

## 2020-03-24 NOTE — Progress Notes (Signed)
Patient ID: Jared Cline, male    DOB: Mar 27, 1946, 74 y.o.   MRN: [id number]  PCP: Jared Grana, PA-C  Chief Complaint  Patient presents with  . Leg Pain    both legs from the knee down, very painful/tingling    Subjective:   Jared Cline is a 74 y.o. male, presents to clinic with CC of the following:  Chief Complaint  Patient presents with  . Leg Pain    both legs from the knee down, very painful/tingling    HPI:  Patient is a 74 year old male patient of Jared Cline Last visit was 03/08/2020 Follows up today with increasing leg pains.  Patient with a known history of OA and bilateral knee pain, worse with sitting in low seats, bakers cyst history and going to Jared Cline -ortho.   Last visit with Ortho was in April 2021 with details from that visit unavailable to review  Has noted increasing lower extremity pains in recent past, he notes from the knees down, with increasing pains and some tingling at times.  Recent work-up included the below: Ultrasound arterial ABI done 03/13/2020 IMPRESSION: Right:  Resting ABI in the mild range arterial occlusive disease. Segmental exam at the ankle demonstrates more proximal occlusive disease.  Left:  Resting ABI within normal limits.  Segmental exam demonstrates posterior tibial arterial occlusive disease with maintained waveform at the dorsalis pedis.  A telephone message from 03/20/2020 from Jared Cline noted the above results consistent with mild peripheral artery disease and requested a referral to vascular specialist for further evaluation and appointment was made for 04/10/2020. He notes when he gets up and about, after a short distance, he feels a tightness and a pain that can grab at his calf regions, and feels the pain continue as on his feet.  He denies any increased focal knee pains bilaterally.  Denies any calf swelling, any increased warmth of the calves or redness of the calves.  Denies any marked increased swelling,  although does have a little bit of swelling that seems persistent much of the day in the lower extremities.  He was presenting to inquire about ways to help with the pain until he is seen. 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  Patient Active Problem List   Diagnosis Date Noted  . LVH (left ventricular hypertrophy) due to hypertensive disease, without heart failure 05/06/2018  . First degree AV block 04/15/2018  . Medication monitoring encounter 06/09/2017  . Coronary artery calcification seen on CAT scan 03/20/2017  . OSA on CPAP 11/22/2015  . Aortic atherosclerosis ([place]) 10/25/2015  . Right renal mass 10/19/2015  . Hyperlipidemia   . Hypertension   . CKD (chronic kidney disease) stage 2, GFR 60-89 ml/min   . Carpal tunnel syndrome, left   . History of prostate cancer   . Multiple lung nodules       Current Outpatient Medications:  .  aspirin EC 81 MG tablet, Take 81 mg by mouth daily., Disp: , Rfl:  .  cloNIDine (CATAPRES) 0.3 MG tablet, Take 1 tablet (0.3 mg total) by mouth 2 (two) times daily., Disp: 180 tablet, Rfl: 1 .  hydrochlorothiazide (HYDRODIURIL) 25 MG tablet, TAKE 1 TABLET BY MOUTH EVERY DAY, Disp: 90 tablet, Rfl: 3 .  losartan (COZAAR) 25 MG tablet, Take 25 mg by mouth daily., Disp: , Rfl:  .  magnesium oxide (MAG-OX) 400 MG tablet, Take 400 mg by mouth daily., Disp: ,  Rfl:  .  meloxicam (MOBIC) 15 MG tablet, Take 15 mg by mouth daily., Disp: , Rfl:  .  Potassium Gluconate 550 MG TABS, Take 550 mg by mouth daily., Disp: , Rfl:  .  pravastatin (PRAVACHOL) 40 MG tablet, Take 1 tablet by mouth daily., Disp: , Rfl:    Allergies  Allergen Reactions  . Flomax [Tamsulosin Hcl] Shortness Of Breath  . Ace Inhibitors Other (See Comments)    Angioedema  . Amlodipine Other (See Comments)    Angioedema  . Doxazosin   . Lipitor [Atorvastatin] Other (See Comments)    Memory loss     Past Surgical History:  Procedure Laterality Date  .  abdominal ultrasound  Oct 2015   Negative for eval for AAA  . COLONOSCOPY    . KNEE ARTHROSCOPY Right    x 2  . LUMBAR LAMINECTOMY    . PROSTATECTOMY     robotic     Family History  Problem Relation Age of Onset  . Stroke Mother   . Hypertension Mother   . Aneurysm Father        cerebral  . Hypertension Father   . Heart disease Paternal Uncle   . Alcohol abuse Brother   . Cancer Neg Hx   . COPD Neg Hx   . Diabetes Neg Hx      Social History   Tobacco Use  . Smoking status: Former Smoker    Packs/day: 0.50    Years: 10.00    Pack years: 5.00    Types: Cigarettes    Quit date: 08/20/1971    Years since quitting: 48.6  . Smokeless tobacco: Never Used  Substance Use Topics  . Alcohol use: No    Alcohol/week: 0.0 standard drinks    With staff assistance, above reviewed with the patient today.  ROS: As per HPI, otherwise no specific complaints on a limited and focused system review   No results found for this or any previous visit (from the past 72 hour(s)).   PHQ2/9: Depression screen [facility] 2/9 03/24/2020 03/08/2020 09/09/2019 07/01/2019 05/25/2019  Decreased Interest 0 0 0 0 0  Down, Depressed, Hopeless 0 0 0 0 0  PHQ - 2 Score 0 0 0 0 0  Altered sleeping 0 0 0 - 0  Tired, decreased energy 0 0 0 - 0  Change in appetite 0 0 0 - 0  Feeling bad or failure about yourself  0 0 0 - 0  Trouble concentrating 0 0 0 - 0  Moving slowly or fidgety/restless 0 0 0 - 0  Suicidal thoughts 0 0 0 - -  PHQ-9 Score 0 0 0 - 0  Difficult doing work/chores Not difficult at all Not difficult at all Not difficult at all - Not difficult at all   PHQ-2/9 Result is neg  Fall Risk: Fall Risk  03/24/2020 03/08/2020 09/09/2019 07/01/2019 05/25/2019  Falls in the past year? 0 0 0 0 0  Number falls in past yr: 0 0 0 0 -  Injury with Fall? 0 0 0 0 0  Follow up - Falls evaluation completed Falls evaluation completed Falls prevention discussed -      Objective:   Vitals:   03/24/20 1310  BP:  118/60  Pulse: 80  Resp: 16  Temp: 98 F (36.7 C)  TempSrc: Temporal  SpO2: 100%  Weight: 235 lb (106.6 kg)  Height: 6' (1.829 m)    Body mass index is 31.87 kg/m.  Physical Exam  NAD, masked, pleasant HEENT - [place]/AT, sclera anicteric, Car - RRR, not tachycardic Pulm- RR and effort normal at rest,  Ext -trace to 1+ lower extremity edema noted bilaterally, with some very mild indentation at the sock level bilateral, chronic deformities of the knees noted bilateral with adequate range of motion, nontender with palpation of the calf muscles bilaterally with no cords, no increased erythema, no increased swelling in the calves, no increased warmth, the feet were warm to the touch, sensation was intact to light touch of the feet bilaterally and of the lower extremities distally bilaterally he had adequate ankle motion and can wiggle his toes.  Dorsalis pedis pulses were palpable although weak bilaterally, difficult to ascertain the posterior tibial pulses clinically, He noted at rest on the exam table he was having no pain presently. neuro/psychiatric - affect was not flat, appropriate with conversation  Alert with speech normal  Results for orders placed or performed in visit on 03/08/20  COMPLETE METABOLIC PANEL WITH GFR  Result Value Ref Range   Glucose, Bld 103 (H) 65 - 99 mg/dL   BUN 18 7 - 25 mg/dL   Creat 1.30 (H) 0.70 - 1.18 mg/dL   GFR, Est Non African American 53 (L) > OR = 60 mL/min/1.40m2   GFR, Est African American 62 > OR = 60 mL/min/1.33m2   BUN/Creatinine Ratio 14 6 - 22 (calc)   Sodium 139 135 - 146 mmol/L   Potassium 4.8 3.5 - 5.3 mmol/L   Chloride 106 98 - 110 mmol/L   CO2 29 20 - 32 mmol/L   Calcium 9.5 8.6 - 10.3 mg/dL   Total Protein 7.0 6.1 - 8.1 g/dL   Albumin 4.5 3.6 - 5.1 g/dL   Globulin 2.5 1.9 - 3.7 g/dL (calc)   AG Ratio 1.8 1.0 - 2.5 (calc)   Total Bilirubin 0.3 0.2 - 1.2 mg/dL   Alkaline phosphatase (APISO) 64 35 - 144 U/L   AST 17 10 - 35 U/L    ALT 10 9 - 46 U/L  Magnesium  Result Value Ref Range   Magnesium 2.5 1.5 - 2.5 mg/dL       Assessment & Plan:   1. Leg pain, bilateral 2. PVD (peripheral vascular disease) ([place]) Has pretty typical claudication type symptoms in the lower extremities, with the recent ABI noting concerns for disease as noted above. Has a follow-up plan with vascular surgery this month as noted above Recommended only brief intervals of being up and about and if pain beginning as ambulating, to stop and get off his feet at that point. Also can keep the legs elevated, especially at the end of the day to help lessen swelling, and he is on a low-dose diuretic presently. Also can try some warmth to the area to help with increasing flow He is on an aspirin product and will continue. Statin product may be considered, although do not think will help significantly with acute discomfort presently Await input from vascular, and have asked Melissa to call to see if we can get his appointment moved up.   Emphasized if he develops any acute calf pains, especially if any swelling acutely, especially if one-sided, any increased warmth or redness in the calf region, he needs to head to an emergency setting to evaluate for any potential clot concern.  He was understanding of that.    Towanda Malkin, MD 03/24/20 1:16 PM

## 2020-03-24 NOTE — Patient Instructions (Signed)
We have placed a call to vascular try to get you in a little sooner for your appointment

## 2020-04-10 ENCOUNTER — Encounter (INDEPENDENT_AMBULATORY_CARE_PROVIDER_SITE_OTHER): Payer: Self-pay | Admitting: Vascular Surgery

## 2020-04-10 ENCOUNTER — Ambulatory Visit (INDEPENDENT_AMBULATORY_CARE_PROVIDER_SITE_OTHER): Payer: Medicare Other | Admitting: Vascular Surgery

## 2020-04-10 ENCOUNTER — Other Ambulatory Visit: Payer: Self-pay

## 2020-04-10 VITALS — BP 232/83 | HR 73 | Ht 72.0 in | Wt 235.0 lb

## 2020-04-10 DIAGNOSIS — I10 Essential (primary) hypertension: Secondary | ICD-10-CM | POA: Diagnosis not present

## 2020-04-10 DIAGNOSIS — E782 Mixed hyperlipidemia: Secondary | ICD-10-CM | POA: Diagnosis not present

## 2020-04-10 DIAGNOSIS — I739 Peripheral vascular disease, unspecified: Secondary | ICD-10-CM

## 2020-04-10 DIAGNOSIS — I25118 Atherosclerotic heart disease of native coronary artery with other forms of angina pectoris: Secondary | ICD-10-CM

## 2020-04-13 ENCOUNTER — Encounter (INDEPENDENT_AMBULATORY_CARE_PROVIDER_SITE_OTHER): Payer: Self-pay | Admitting: Vascular Surgery

## 2020-04-13 NOTE — Progress Notes (Signed)
MRN : [id number]  Jared Cline is a 74 y.o. ([birth year]) male who presents with chief complaint of  Chief Complaint  Patient presents with  . New Patient (Initial Visit)    Leg pain PVD ABI Performed AT [facility]   .  History of Present Illness:    The patient is seen for evaluation of painful lower extremities and diminished pulses. Patient notes the pain is always associated with activity and is very consistent day today. Typically, the pain occurs at less than one block, progress is as activity continues to the point that the patient must stop walking. Resting including standing still for several minutes allowed resumption of the activity and the ability to walk a similar distance before stopping again. Uneven terrain and inclined shorten the distance. The pain has been progressive over the past several years. The patient states the inability to walk is now having a profound negative impact on quality of life and daily activities.  The patient denies rest pain or dangling of an extremity off the side of the bed during the night for relief. No open wounds or sores at this time. No prior interventions or surgeries.  No history of back problems or DJD of the lumbar sacral spine.   The patient denies changes in claudication symptoms or new rest pain symptoms.  No new ulcers or wounds of the foot.  The patient's blood pressure has been stable and relatively well controlled. The patient denies amaurosis fugax or recent TIA symptoms. There are no recent neurological changes noted. The patient denies history of DVT, PE or superficial thrombophlebitis. The patient denies recent episodes of angina or shortness of breath.   Current Meds  Medication Sig  . aspirin EC 81 MG tablet Take 81 mg by mouth daily.  . cloNIDine (CATAPRES) 0.3 MG tablet Take 1 tablet (0.3 mg total) by mouth 2 (two) times daily.  . hydrochlorothiazide (HYDRODIURIL) 25 MG tablet TAKE 1 TABLET BY MOUTH EVERY DAY  . losartan  (COZAAR) 25 MG tablet Take 25 mg by mouth daily.  . magnesium oxide (MAG-OX) 400 MG tablet Take 400 mg by mouth daily.  . meloxicam (MOBIC) 15 MG tablet Take 15 mg by mouth daily.  . Potassium Gluconate 550 MG TABS Take 550 mg by mouth daily.  . pravastatin (PRAVACHOL) 40 MG tablet Take 1 tablet by mouth daily.    Past Medical History:  Diagnosis Date  . BPH (benign prostatic hypertrophy)   . Bradycardia    evaluated by Dr. Nehemiah Massed  . Cancer [facility])    prostate  . Carpal tunnel syndrome, left    left arm  . CKD (chronic kidney disease) stage 2, GFR 60-89 ml/min   . Diverticulitis   . History of prostate cancer   . HNP (herniated nucleus pulposus), lumbar   . Hyperlipidemia   . Hypertension   . IBS (irritable bowel syndrome)   . Impotence   . Multiple lung nodules    nonspecific, largest 34mm in Jan 2016; scanned March 2017; rescan 3-6 months later  . OA (osteoarthritis) of knee    right  . Renal mass    evaluated; followed by urologist  . Sleep apnea    on CPAP  . Torn meniscus    x 2; right knee    Past Surgical History:  Procedure Laterality Date  . abdominal ultrasound  Oct 2015   Negative for eval for AAA  . COLONOSCOPY    . KNEE ARTHROSCOPY Right  x 2  . LUMBAR LAMINECTOMY    . PROSTATECTOMY     robotic    Social History Social History   Tobacco Use  . Smoking status: Former Smoker    Packs/day: 0.50    Years: 10.00    Pack years: 5.00    Types: Cigarettes    Quit date: 08/20/1971    Years since quitting: 48.6  . Smokeless tobacco: Never Used  Vaping Use  . Vaping Use: Never used  Substance Use Topics  . Alcohol use: No    Alcohol/week: 0.0 standard drinks  . Drug use: No    Family History Family History  Problem Relation Age of Onset  . Stroke Mother   . Hypertension Mother   . Aneurysm Father        cerebral  . Hypertension Father   . Heart disease Paternal Uncle   . Alcohol abuse Brother   . Cancer Neg Hx   . COPD Neg Hx   .  Diabetes Neg Hx   No family history of bleeding/clotting disorders, porphyria or autoimmune disease   Allergies  Allergen Reactions  . Flomax [Tamsulosin Hcl] Shortness Of Breath  . Ace Inhibitors Other (See Comments)    Angioedema  . Amlodipine Other (See Comments)    Angioedema  . Doxazosin   . Lipitor [Atorvastatin] Other (See Comments)    Memory loss     REVIEW OF SYSTEMS (Negative unless checked)  Constitutional: [] Weight loss  [] Fever  [] Chills Cardiac: [] Chest pain   [] Chest pressure   [] Palpitations   [] Shortness of breath when laying flat   [] Shortness of breath with exertion. Vascular:  [x] Pain in legs with walking   [x] Pain in legs at rest  [] History of DVT   [] Phlebitis   [] Swelling in legs   [] Varicose veins   [] Non-healing ulcers Pulmonary:   [] Uses home oxygen   [] Productive cough   [] Hemoptysis   [] Wheeze  [] COPD   [] Asthma Neurologic:  [] Dizziness   [] Seizures   [] History of stroke   [] History of TIA  [] Aphasia   [] Vissual changes   [] Weakness or numbness in arm   [] Weakness or numbness in leg Musculoskeletal:   [] Joint swelling   [] Joint pain   [] Low back pain Hematologic:  [] Easy bruising  [] Easy bleeding   [] Hypercoagulable state   [] Anemic Gastrointestinal:  [] Diarrhea   [] Vomiting  [] Gastroesophageal reflux/heartburn   [] Difficulty swallowing. Genitourinary:  [] Chronic kidney disease   [] Difficult urination  [] Frequent urination   [] Blood in urine Skin:  [] Rashes   [] Ulcers  Psychological:  [] History of anxiety   []  History of major depression.  Physical Examination  Vitals:   04/10/20 0936  BP: (!) 232/83  Pulse: 73  Weight: 235 lb (106.6 kg)  Height: 6' (1.829 m)   Body mass index is 31.87 kg/m. Gen: WD/WN, NAD Head: [place]/AT, No temporalis wasting.  Ear/Nose/Throat: Hearing grossly intact, nares w/o erythema or drainage, poor dentition Eyes: PER, EOMI, sclera nonicteric.  Neck: Supple, no masses.  No bruit or JVD.  Pulmonary:  Good air movement,  clear to auscultation bilaterally, no use of accessory muscles.  Cardiac: RRR, normal S1, S2, no Murmurs. Vascular:  Vessel Right Left  Radial Palpable Palpable  PT Not Palpable Not Palpable  DP Not Palpable Not Palpable  Gastrointestinal: soft, non-distended. No guarding/no peritoneal signs.  Musculoskeletal: M/S 5/5 throughout.  No deformity or atrophy.  Neurologic: CN 2-12 intact. Pain and light touch intact in extremities.  Symmetrical.  Speech is fluent. Motor  exam as listed above. Psychiatric: Judgment intact, Mood & affect appropriate for pt's clinical situation. Dermatologic: No rashes or ulcers noted.  No changes consistent with cellulitis.   CBC Lab Results  Component Value Date   WBC 4.9 10/19/2015   HGB 12.3 (L) 10/19/2015   HCT 37.6 10/19/2015   MCV 83 10/19/2015   PLT 343 10/19/2015    BMET    Component Value Date/Time   NA 139 03/08/2020 0941   NA 135 10/19/2015 1048   NA 133 (L) 08/25/2014 1020   K 4.8 03/08/2020 0941   K 4.1 08/25/2014 1020   CL 106 03/08/2020 0941   CL 100 08/25/2014 1020   CO2 29 03/08/2020 0941   CO2 27 08/25/2014 1020   GLUCOSE 103 (H) 03/08/2020 0941   GLUCOSE 95 08/25/2014 1020   BUN 18 03/08/2020 0941   BUN 12 10/19/2015 1048   BUN 13 08/25/2014 1020   CREATININE 1.30 (H) 03/08/2020 0941   CALCIUM 9.5 03/08/2020 0941   CALCIUM 9.1 08/25/2014 1020   GFRNONAA 53 (L) 03/08/2020 0941   GFRAA 62 03/08/2020 0941   CrCl cannot be calculated (Patient's most recent lab result is older than the maximum 21 days allowed.).  COAG No results found for: INR, PROTIME  Radiology No results found.    Assessment/Plan 1. PVD (peripheral vascular disease) ([place]) Recommend:  Patient should undergo arterial duplex of the lower extremity because of the severe symptoms in the patient's lower extremity symptoms.  The patient states they are having increased pain and a marked decrease in the distance that they can walk.  The risks and  benefits as well as the alternatives were discussed in detail with the patient.  All questions were answered.  Patient agrees to proceed and understands this could be a prelude to angiography and intervention.  The patient will follow up with me in the office to review the studies.   - VAS [place] ABI WITH/WO TBI; Future - VAS [place] LOWER EXTREMITY ARTERIAL DUPLEX; Future  2. Coronary artery disease of native artery of native heart with stable angina pectoris (HCC) Continue cardiac and antihypertensive medications as already ordered and reviewed, no changes at this time.  Continue statin as ordered and reviewed, no changes at this time  Nitrates PRN for chest pain   3. Essential hypertension Continue antihypertensive medications as already ordered, these medications have been reviewed and there are no changes at this time.   4. Mixed hyperlipidemia Continue statin as ordered and reviewed, no changes at this time    Hortencia Pilar, MD  04/13/2020 10:32 AM

## 2020-04-18 NOTE — Progress Notes (Signed)
Patient ID: Jared Cline, male    DOB: 1946-03-07, 73 y.o.   MRN: [id number]  PCP: Delsa Grana, PA-C  Chief Complaint  Patient presents with  . Numbness    in hands and feet for a couple of months now    Subjective:   Jared Cline is a 74 y.o. male, presents to clinic with CC of the following:  Chief Complaint  Patient presents with  . Numbness    in hands and feet for a couple of months now    HPI:  Patient is a 74 year old male patient of Delsa Grana I saw him 03/24/2020 for leg pain. Follows up today with numbness in his feet and hands. Patient was scheduled for 740 this morning, although then was added between scheduled patients at a later spot.   To review, patient with a known history of OA and bilateral knee pain, worse with sitting in low seats, bakers cyst history and going to Dr. Sabra Heck -ortho.   Last visit with Ortho was in April 2021 with details from that visit unavailable to review   Last visit he noted increasing lower extremity pains in recent past, he notes from the knees down, with increasing pains and some tingling at times   He saw vascular surgery 04/10/2020 with the following assessment/plan noted:             Assessment/Plan 1. PVD (peripheral vascular disease) ([place]) Recommend: Patient should undergo arterial duplex of the lower extremity because of the severe symptoms in the patient's lower extremity symptoms.  The patient states they are having increased pain and a marked decrease in the distance that they can walk. The risks and benefits as well as the alternatives were discussed in detail with the patient.  All questions were answered.  Patient agrees to proceed and understands this could be a prelude to angiography and intervention. The patient will follow up with me in the office to review the studies.    - VAS [place] ABI WITH/WO TBI; Future - VAS [place] LOWER EXTREMITY ARTERIAL DUPLEX; Future   2. Coronary artery disease of native artery of native  heart with stable angina pectoris (HCC) Continue cardiac and antihypertensive medications as already ordered and reviewed, no changes at this time. Continue statin as ordered and reviewed, no changes at this time Nitrates PRN for chest pain   3. Essential hypertension Continue antihypertensive medications as already ordered, these medications have been reviewed and there are no changes at this time.   4. Mixed hyperlipidemia Continue statin as ordered and reviewed, no changes at this time     He follows up today noting continued numbness feelings in his lower extremities distally and feet, really from the calf down to the feet, and still has some pain in this region as well.  He notes he occasionally gets some numbness and tingling in his hands, although that is very intermittent, and not as problematic as what he experiences in his distal lower extremities.  He denies any hand weakness, not dropping things.  He also denies any foot drop, or weakness in the distal lower extremities.  His legs are slightly swollen distally, and to get a little worse as the day progresses.  He tried compression stockings, although noted they did not help much, and the ones he tried only came up to mid calf level.  He does try to elevate his legs in a recliner frequently. When asked about his recent visit with vascular, he noted they  ordered some x-rays, and told him they would see him back in about 6 months.  (Not what was the plan noted from their visit above, and reviewed this with him). He is not aware of any tests that are scheduled for him presently from that recent vascular visit.  He remains on his blood pressure medication which includes a diuretic and and his blood pressures have been fairly well controlled recently. Denies any vision changes, chest pains, shortness of breath. He denies any history of thyroid disease. His last labs were reviewed, with his CKD stable on last check in January.          .        Patient Active Problem List   Diagnosis Date Noted  . PVD (peripheral vascular disease) ([place]) 03/24/2020  . Leg pain, bilateral 03/24/2020  . LVH (left ventricular hypertrophy) due to hypertensive disease, without heart failure 05/06/2018  . First degree AV block 04/15/2018  . Coronary artery disease involving native coronary artery of native heart 04/15/2018  . Medication monitoring encounter 06/09/2017  . Coronary artery calcification seen on CAT scan 03/20/2017  . OSA on CPAP 11/22/2015  . Aortic atherosclerosis ([place]) 10/25/2015  . Right renal mass 10/19/2015  . Hyperlipidemia   . Hypertension   . CKD (chronic kidney disease) stage 2, GFR 60-89 ml/min   . Carpal tunnel syndrome, left   . History of prostate cancer   . Multiple lung nodules       Current Outpatient Medications:  .  aspirin EC 81 MG tablet, Take 81 mg by mouth daily., Disp: , Rfl:  .  cloNIDine (CATAPRES) 0.3 MG tablet, Take 1 tablet (0.3 mg total) by mouth 2 (two) times daily., Disp: 180 tablet, Rfl: 1 .  hydrochlorothiazide (HYDRODIURIL) 25 MG tablet, TAKE 1 TABLET BY MOUTH EVERY DAY, Disp: 90 tablet, Rfl: 3 .  losartan (COZAAR) 25 MG tablet, Take 25 mg by mouth daily., Disp: , Rfl:  .  magnesium oxide (MAG-OX) 400 MG tablet, Take 400 mg by mouth daily., Disp: , Rfl:  .  meloxicam (MOBIC) 15 MG tablet, Take 15 mg by mouth daily., Disp: , Rfl:  .  Potassium Gluconate 550 MG TABS, Take 550 mg by mouth daily., Disp: , Rfl:  .  pravastatin (PRAVACHOL) 40 MG tablet, Take 1 tablet by mouth daily., Disp: , Rfl:    Allergies  Allergen Reactions  . Flomax [Tamsulosin Hcl] Shortness Of Breath  . Ace Inhibitors Other (See Comments)    Angioedema  . Amlodipine Other (See Comments)    Angioedema  . Doxazosin   . Lipitor [Atorvastatin] Other (See Comments)    Memory loss     Past Surgical History:  Procedure Laterality Date  . abdominal ultrasound  Oct 2015   Negative for eval for AAA  .  COLONOSCOPY    . KNEE ARTHROSCOPY Right    x 2  . LUMBAR LAMINECTOMY    . PROSTATECTOMY     robotic     Family History  Problem Relation Age of Onset  . Stroke Mother   . Hypertension Mother   . Aneurysm Father        cerebral  . Hypertension Father   . Heart disease Paternal Uncle   . Alcohol abuse Brother   . Cancer Neg Hx   . COPD Neg Hx   . Diabetes Neg Hx      Social History   Tobacco Use  . Smoking status: Former Smoker  Packs/day: 0.50    Years: 10.00    Pack years: 5.00    Types: Cigarettes    Quit date: 08/20/1971    Years since quitting: 48.6  . Smokeless tobacco: Never Used  Substance Use Topics  . Alcohol use: No    Alcohol/week: 0.0 standard drinks    With staff assistance, above reviewed with the patient/caregiver today.  ROS: As per HPI, otherwise no specific complaints on a limited and focused system review   No results found for this or any previous visit (from the past 72 hour(s)).   PHQ2/9: Depression screen [facility] 2/9 04/19/2020 03/24/2020 03/08/2020 09/09/2019 07/01/2019  Decreased Interest 0 0 0 0 0  Down, Depressed, Hopeless 0 0 0 0 0  PHQ - 2 Score 0 0 0 0 0  Altered sleeping - 0 0 0 -  Tired, decreased energy - 0 0 0 -  Change in appetite - 0 0 0 -  Feeling bad or failure about yourself  - 0 0 0 -  Trouble concentrating - 0 0 0 -  Moving slowly or fidgety/restless - 0 0 0 -  Suicidal thoughts - 0 0 0 -  PHQ-9 Score - 0 0 0 -  Difficult doing work/chores - Not difficult at all Not difficult at all Not difficult at all -  Some recent data might be hidden   PHQ-2/9 Result is   Fall Risk: Fall Risk  04/19/2020 03/24/2020 03/08/2020 09/09/2019 07/01/2019  Falls in the past year? 0 0 0 0 0  Number falls in past yr: 0 0 0 0 0  Injury with Fall? 0 0 0 0 0  Follow up Falls evaluation completed - Falls evaluation completed Falls evaluation completed Falls prevention discussed      Objective:   Vitals:   04/19/20 0836  BP: 138/84  Pulse: 77   Resp: 16  Temp: 98.4 F (36.9 C)  TempSrc: Oral  SpO2: 100%  Weight: 233 lb 8 oz (105.9 kg)  Height: 6' (1.829 m)    Body mass index is 31.67 kg/m.  Physical Exam   NAD, , masked,  very pleasant HEENT - /AT, sclera anicteric, Car - RRR, not tachycardic Pulm- RR and effort normal at rest,  Ext - 1+ lower extremity edema noted bilaterally,  nontender with palpation of the calf muscles bilaterally with no cords, no increased erythema, no increased swelling in the calves, no increased warmth, the feet were warm to the touch, sensation was intact to light touch of the feet bilaterally and of the lower extremities distally bilaterally, he had adequate ankle motion and can wiggle his toes.  Dorsalis pedis pulses were palpable although weak bilaterally, difficult to ascertain the posterior tibial pulses clinically, good strength with dorsi and plantarflexion of the foot and with knee flexion extension, Feet with no skin breakdown, ulcers lateral, Monofilament testing within normal limits Sensation intact to light touch in the distal upper extremities, with good pulses in the distal upper extremities and good grip strength noted. neuro/psychiatric - affect was not flat, appropriate with conversation             Alert with speech normal   Results for orders placed or performed in visit on 03/08/20  COMPLETE METABOLIC PANEL WITH GFR  Result Value Ref Range   Glucose, Bld 103 (H) 65 - 99 mg/dL   BUN 18 7 - 25 mg/dL   Creat 1.30 (H) 0.70 - 1.18 mg/dL   GFR, Est Non African American 53 (L) >  OR = 60 mL/min/1.75m2   GFR, Est African American 62 > OR = 60 mL/min/1.19m2   BUN/Creatinine Ratio 14 6 - 22 (calc)   Sodium 139 135 - 146 mmol/L   Potassium 4.8 3.5 - 5.3 mmol/L   Chloride 106 98 - 110 mmol/L   CO2 29 20 - 32 mmol/L   Calcium 9.5 8.6 - 10.3 mg/dL   Total Protein 7.0 6.1 - 8.1 g/dL   Albumin 4.5 3.6 - 5.1 g/dL   Globulin 2.5 1.9 - 3.7 g/dL (calc)   AG Ratio 1.8 1.0 - 2.5 (calc)    Total Bilirubin 0.3 0.2 - 1.2 mg/dL   Alkaline phosphatase (APISO) 64 35 - 144 U/L   AST 17 10 - 35 U/L   ALT 10 9 - 46 U/L  Magnesium  Result Value Ref Range   Magnesium 2.5 1.5 - 2.5 mg/dL       Assessment & Plan:   1. Leg pain, bilateral 2. PVD (peripheral vascular disease) ([place]) Has pretty typical claudication type symptoms in the lower extremities noted, and the recent follow-up with vascular at the end of August noted above. Further studies were ordered, and await those results and then follow-up after with vascular. Asked Melissa to help with ensuring that the studies are being scheduled and then having arrangements for a follow-up after with vascular.  Previously recommended only brief intervals of being up and about and if pain beginning as ambulating, to stop and get off his feet at that point, also can keep the legs elevated, especially at the end of the day to help lessen swelling, and continue the low-dose diuretic presently.  Hold on a stronger diuretic like Lasix at this time as concerns how it may worsen kidney function as await follow-up with vascular. He is on an aspirin product and will continue. He is on a statin product presently and to continue.  3. Numbness and tingling of both lower extremities Discussed how the swelling is likely contributing, and possibly may have some vascular concerns contributing as well.  He has no history of diabetes. Recommendations as above to help control the swelling, Will check a TSH today as well as rechecking a BMP to assess his kidney function and electrolytes.  - TSH  4. Swelling of both lower extremities As above noted, recommendations to control the swelling above. Strongly encouraged better compression stockings, and using them regularly as do think will be helpful.  Noted can be harder in these warmer weather months, although strongly encouraged adding this in addition to the elevation he does. - TSH  5. Stage 3a chronic  kidney disease Await recheck of labs today. - BASIC METABOLIC PANEL WITH GFR  6.  Hand numbness - mild Symptoms are intermittent, with no weakness concerns. We will add a TSH to his labs today Continue to monitor presently, and if symptoms increasing as we discussed, needs to follow-up.  Await lab results above, and studies recommended by vascular and follow-up after with them  He is aware if he develops any acute calf pains, especially if any swelling acutely, especially if one-sided, any increased warmth or redness in the calf region, he needs to head to an emergency setting to evaluate for any potential clot concern.  Noted if symptoms more problematic or worsening, should again follow-up.  Towanda Malkin, MD 04/19/20 8:59 AM

## 2020-04-19 ENCOUNTER — Ambulatory Visit: Payer: Medicare Other | Admitting: Internal Medicine

## 2020-04-19 ENCOUNTER — Encounter: Payer: Self-pay | Admitting: Internal Medicine

## 2020-04-19 ENCOUNTER — Other Ambulatory Visit: Payer: Self-pay

## 2020-04-19 VITALS — BP 138/84 | HR 77 | Temp 98.4°F | Resp 16 | Ht 72.0 in | Wt 233.5 lb

## 2020-04-19 DIAGNOSIS — N1831 Chronic kidney disease, stage 3a: Secondary | ICD-10-CM

## 2020-04-19 DIAGNOSIS — R200 Anesthesia of skin: Secondary | ICD-10-CM | POA: Insufficient documentation

## 2020-04-19 DIAGNOSIS — R202 Paresthesia of skin: Secondary | ICD-10-CM

## 2020-04-19 DIAGNOSIS — M79605 Pain in left leg: Secondary | ICD-10-CM

## 2020-04-19 DIAGNOSIS — M79604 Pain in right leg: Secondary | ICD-10-CM | POA: Diagnosis not present

## 2020-04-19 DIAGNOSIS — M7989 Other specified soft tissue disorders: Secondary | ICD-10-CM | POA: Insufficient documentation

## 2020-04-19 DIAGNOSIS — I739 Peripheral vascular disease, unspecified: Secondary | ICD-10-CM

## 2020-04-20 LAB — BASIC METABOLIC PANEL WITH GFR
BUN/Creatinine Ratio: 14 (calc) (ref 6–22)
BUN: 20 mg/dL (ref 7–25)
CO2: 29 mmol/L (ref 20–32)
Calcium: 10 mg/dL (ref 8.6–10.3)
Chloride: 100 mmol/L (ref 98–110)
Creat: 1.47 mg/dL — ABNORMAL HIGH (ref 0.70–1.18)
GFR, Est African American: 53 mL/min/{1.73_m2} — ABNORMAL LOW (ref >=60)
GFR, Est Non African American: 46 mL/min/{1.73_m2} — ABNORMAL LOW (ref >=60)
Glucose, Bld: 109 mg/dL — ABNORMAL HIGH (ref 65–99)
Potassium: 4.8 mmol/L (ref 3.5–5.3)
Sodium: 136 mmol/L (ref 135–146)

## 2020-04-20 LAB — TSH: TSH: 1.1 mIU/L (ref 0.40–4.50)

## 2020-04-21 ENCOUNTER — Telehealth (INDEPENDENT_AMBULATORY_CARE_PROVIDER_SITE_OTHER): Payer: Self-pay | Admitting: Vascular Surgery

## 2020-04-21 NOTE — Progress Notes (Signed)
Melissa, Please let the patient know that his lab results were reviewed, and sorry for the delay as there was a delay in getting these results back to [place] His TSH (thyroid screen) was normal at 1.10 His basic metabolic panel showed that his glucose was slightly elevated at 109, with his kidney function slightly worsening with a creatinine 1.47 and his GFR 53. Please recommend the importance of staying well-hydrated in these warmer weather months, and pending results of follow-up checks, may need to consider stopping the meloxicam product that he is taking presently.  Also recommend that he avoid any additional ibuprofen or Aleve type products presently.  His electrolytes and calcium level were normal. He is scheduled to see Lattie Haw again in January, and I would recommend moving that appointment for a follow-up visit in 3 months, and would recheck his lab tests at that time as part of the visit. Thanks [facility]

## 2020-04-21 NOTE — Telephone Encounter (Signed)
Patient called stating that his legs and hands are going numb, he is having muscle spasms or cramps in his calves and he would like to be seen sooner.  Patient was last seen 04-10-20 as a new patient but recently went to [facility] and his doctor advised him to call us to be seen. He has a 64mo f/u in Feb of next year with [facility] and ABI studies. Please advise.

## 2020-04-21 NOTE — Telephone Encounter (Signed)
I spoke with the patient and he made me aware  that both hands and feet are numb and have been so for about 2 months. The pt states he is not having any problems moving his legs or feet and denies neuropathy. He has no wounds, and  no hx of blood clots. The pt is having swelling in both his ankles and on the top of  of his feet. On the pain scale he rated a 5 . The patient has had no recent procedures.  Per his last office note  her was seen on 04/10/20 for painful lower extremity and diminished pulse. The pt was recommended to have U/S ABI  W/ WO  TBI and VAS U/S lower extremity arterial duplex.

## 2020-04-21 NOTE — Telephone Encounter (Signed)
That's fine, move up his scheduled appointments with the studies associated as the schedule allows..see me or GS

## 2020-04-25 NOTE — Telephone Encounter (Signed)
I called the pt and made him aware that the appointment scheduler will be calling him to have him see sooner.

## 2020-04-26 DIAGNOSIS — G4733 Obstructive sleep apnea (adult) (pediatric): Secondary | ICD-10-CM | POA: Diagnosis not present

## 2020-05-03 ENCOUNTER — Ambulatory Visit (INDEPENDENT_AMBULATORY_CARE_PROVIDER_SITE_OTHER): Payer: Medicare Other | Admitting: Nurse Practitioner

## 2020-05-03 ENCOUNTER — Ambulatory Visit (INDEPENDENT_AMBULATORY_CARE_PROVIDER_SITE_OTHER): Payer: Medicare Other

## 2020-05-03 ENCOUNTER — Other Ambulatory Visit: Payer: Self-pay

## 2020-05-03 ENCOUNTER — Encounter (INDEPENDENT_AMBULATORY_CARE_PROVIDER_SITE_OTHER): Payer: Self-pay | Admitting: Nurse Practitioner

## 2020-05-03 VITALS — BP 177/72 | HR 63 | Resp 16 | Wt 235.0 lb

## 2020-05-03 DIAGNOSIS — R200 Anesthesia of skin: Secondary | ICD-10-CM | POA: Diagnosis not present

## 2020-05-03 DIAGNOSIS — I739 Peripheral vascular disease, unspecified: Secondary | ICD-10-CM

## 2020-05-03 DIAGNOSIS — E782 Mixed hyperlipidemia: Secondary | ICD-10-CM

## 2020-05-03 DIAGNOSIS — I10 Essential (primary) hypertension: Secondary | ICD-10-CM | POA: Diagnosis not present

## 2020-05-03 DIAGNOSIS — R202 Paresthesia of skin: Secondary | ICD-10-CM

## 2020-05-08 ENCOUNTER — Encounter (INDEPENDENT_AMBULATORY_CARE_PROVIDER_SITE_OTHER): Payer: Self-pay | Admitting: Nurse Practitioner

## 2020-05-08 NOTE — Progress Notes (Signed)
Subjective:    Patient ID: Heron Pitcock, male    DOB: 1945/12/30, 74 y.o.   MRN: [id number] Chief Complaint  Patient presents with  . Follow-up    ultrasound follow up    The patient returns today for noninvasive studies regarding bilateral lower extremity pain and numbness.  The patient notes that he has numbness in his hands which is constant.  He also notices that both of his feet are constantly numb.  There is a constant twisting almost stabbing pain that is present from the knee to the calf.  Started approximately May and has been getting worse since then.  He notes that the pain actually gets worse when he sits for more than 30 minutes.  Once the patient actually gets up and walks and moves around the pain lessens.  He notes that elevation helps a bit.  He denies any discoloration of the lower extremities.  He denies any temperature changes.  The patient has had a previous history of surgery on his back about 25 years ago following an injury.  Today noninvasive studies show a right ABI 0.90 with a left of 1.10.  There is a TBI 0.73 on the right and 0.67 on the left.  The patient has good toe waveforms bilaterally.  The right lower extremity had an arterial duplex done which notes a 50 to 74% stenosis in the common femoral artery as well as a 30 to 49% stenosis in the superficial femoral artery as well as in the popliteal artery.  The patient has triphasic/biphasic waveforms down to the level of the distal SFA where it transitions to monophasic waveforms.  The left lower extremity has a 30 to 49% stenosis noted from the distal common femoral artery extending through the mid SFA.  The proximal popliteal artery has a 50 to 74% stenosis.  The patient has biphasic/triphasic waveforms throughout the left lower extremity until he reaches the distal tibial arteries which transitions to monophasic waveforms.  Preliminary examination reveals no evidence of subclavian steal in either upper  extremity.   Review of Systems  Skin: Positive for color change, pallor and wound.  Neurological: Positive for weakness and numbness.  All other systems reviewed and are negative.      Objective:   Physical Exam Vitals reviewed.  HENT:     Head: Normocephalic.  Cardiovascular:     Rate and Rhythm: Normal rate and regular rhythm.     Pulses: Normal pulses.          Radial pulses are 2+ on the right side and 2+ on the left side.  Pulmonary:     Effort: Pulmonary effort is normal.  Musculoskeletal:        General: Normal range of motion.  Skin:    General: Skin is warm and dry.     Capillary Refill: Capillary refill takes less than 2 seconds.  Neurological:     Mental Status: He is alert and oriented to person, place, and time.  Psychiatric:        Mood and Affect: Mood normal.        Behavior: Behavior normal.        Thought Content: Thought content normal.        Judgment: Judgment normal.     BP (!) 177/72 (BP Location: Right Arm)   Pulse 63   Resp 16   Wt 235 lb (106.6 kg)   BMI 31.87 kg/m   Past Medical History:  Diagnosis Date  . BPH (  benign prostatic hypertrophy)   . Bradycardia    evaluated by Dr. Nehemiah Massed  . Cancer [facility])    prostate  . Carpal tunnel syndrome, left    left arm  . CKD (chronic kidney disease) stage 2, GFR 60-89 ml/min   . Diverticulitis   . History of prostate cancer   . HNP (herniated nucleus pulposus), lumbar   . Hyperlipidemia   . Hypertension   . IBS (irritable bowel syndrome)   . Impotence   . Multiple lung nodules    nonspecific, largest 34mm in Jan 2016; scanned March 2017; rescan 3-6 months later  . OA (osteoarthritis) of knee    right  . Renal mass    evaluated; followed by urologist  . Sleep apnea    on CPAP  . Torn meniscus    x 2; right knee    Social History   Socioeconomic History  . Marital status: Married    Spouse name: Not on file  . Number of children: 3  . Years of education: Not on file  . Highest  education level: Not on file  Occupational History  . Occupation: retired city of [employer]  . Smoking status: Former Smoker    Packs/day: 0.50    Years: 10.00    Pack years: 5.00    Types: Cigarettes    Quit date: 08/20/1971    Years since quitting: 48.7  . Smokeless tobacco: Never Used  Vaping Use  . Vaping Use: Never used  Substance and Sexual Activity  . Alcohol use: No    Alcohol/week: 0.0 standard drinks  . Drug use: No  . Sexual activity: Not Currently  Other Topics Concern  . Not on file  Social History Narrative   Retired from city of [employer] 2012.   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    Social Determinants of Health   Financial Resource Strain: Low Risk   . Difficulty of Paying Living Expenses: Not hard at all  Food Insecurity: No Food Insecurity  . Worried About Charity fundraiser in the Last Year: Never true  . Ran Out of Food in the Last Year: Never true  Transportation Needs: No Transportation Needs  . Lack of Transportation (Medical): No  . Lack of Transportation (Non-Medical): No  Physical Activity: Sufficiently Active  . Days of Exercise per Week: 3 days  . Minutes of Exercise per Session: 60 min  Stress:   . Feeling of Stress : Not on file  Social Connections: Unknown  . Frequency of Communication with Friends and Family: Patient refused  . Frequency of Social Gatherings with Friends and Family: Patient refused  . Attends Religious Services: Patient refused  . Active Member of Clubs or Organizations: Patient refused  . Attends Archivist Meetings: Patient refused  . Marital Status: Married  Human resources officer Violence: Not At Risk  . Fear of Current or Ex-Partner: No  . Emotionally Abused: No  . Physically Abused: No  . Sexually Abused: No    Past Surgical History:  Procedure Laterality Date  . abdominal ultrasound  Oct 2015   Negative for eval for AAA  . COLONOSCOPY    . KNEE ARTHROSCOPY Right     x 2  . LUMBAR LAMINECTOMY    . PROSTATECTOMY     robotic    Family History  Problem Relation Age of Onset  . Stroke Mother   . Hypertension Mother   . Aneurysm Father  cerebral  . Hypertension Father   . Heart disease Paternal Uncle   . Alcohol abuse Brother   . Cancer Neg Hx   . COPD Neg Hx   . Diabetes Neg Hx     Allergies  Allergen Reactions  . Flomax [Tamsulosin Hcl] Shortness Of Breath  . Ace Inhibitors Other (See Comments)    Angioedema  . Amlodipine Other (See Comments)    Angioedema  . Doxazosin   . Lipitor [Atorvastatin] Other (See Comments)    Memory loss       Assessment & Plan:   1. PVD (peripheral vascular disease) ([place])  Recommend:  The patient has evidence of atherosclerosis of the lower extremities with claudication.  The patient does not voice lifestyle limiting changes at this point in time.  Given evidence of atherosclerotic changes in patient's noninvasive studies, angiogram was recommended however at this time the patient does not yet wish to proceed  Noninvasive studies do not suggest clinically significant change.  No invasive studies, angiography or surgery at this time The patient should continue walking and begin a more formal exercise program.  The patient should continue antiplatelet therapy and aggressive treatment of the lipid abnormalities  No changes in the patient's medications at this.  Time  The patient should continue wearing graduated compression socks 10-15 mmHg strength to control the mild edema.    2. Numbness and tingling of both lower extremities Based on patient's description of symptoms, it is likely this is not fully related to peripheral arterial disease.  The patient has had a previous history of spinal surgery that has not been followed up in some years.  We will refer the patient to neurosurgery for further work-up and evaluation as well.  3. Mixed hyperlipidemia Continue statin as ordered and reviewed, no  changes at this time   4. Essential hypertension Continue antihypertensive medications as already ordered, these medications have been reviewed and there are no changes at this time.    Current Outpatient Medications on File Prior to Visit  Medication Sig Dispense Refill  . aspirin EC 81 MG tablet Take 81 mg by mouth daily.    . cloNIDine (CATAPRES) 0.3 MG tablet Take 1 tablet (0.3 mg total) by mouth 2 (two) times daily. 180 tablet 1  . hydrochlorothiazide (HYDRODIURIL) 25 MG tablet TAKE 1 TABLET BY MOUTH EVERY DAY 90 tablet 3  . losartan (COZAAR) 25 MG tablet Take 25 mg by mouth daily.    . magnesium oxide (MAG-OX) 400 MG tablet Take 400 mg by mouth daily.    . meloxicam (MOBIC) 15 MG tablet Take 15 mg by mouth daily.    . Potassium Gluconate 550 MG TABS Take 550 mg by mouth daily.    . pravastatin (PRAVACHOL) 40 MG tablet Take 1 tablet by mouth daily.     No current facility-administered medications on file prior to visit.    There are no Patient Instructions on file for this visit. No follow-ups on file.   Kris Hartmann, NP

## 2020-06-21 DIAGNOSIS — I10 Essential (primary) hypertension: Secondary | ICD-10-CM | POA: Diagnosis not present

## 2020-06-21 DIAGNOSIS — I700 Atherosclerosis of aorta: Secondary | ICD-10-CM | POA: Diagnosis not present

## 2020-06-21 DIAGNOSIS — I2510 Atherosclerotic heart disease of native coronary artery without angina pectoris: Secondary | ICD-10-CM | POA: Diagnosis not present

## 2020-06-21 DIAGNOSIS — E782 Mixed hyperlipidemia: Secondary | ICD-10-CM | POA: Diagnosis not present

## 2020-06-23 DIAGNOSIS — Z0120 Encounter for dental examination and cleaning without abnormal findings: Secondary | ICD-10-CM | POA: Diagnosis not present

## 2020-07-04 ENCOUNTER — Ambulatory Visit (INDEPENDENT_AMBULATORY_CARE_PROVIDER_SITE_OTHER): Payer: Medicare Other

## 2020-07-04 ENCOUNTER — Other Ambulatory Visit: Payer: Self-pay

## 2020-07-04 VITALS — BP 152/72 | HR 73 | Temp 98.0°F | Resp 16 | Ht 72.0 in | Wt 232.8 lb

## 2020-07-04 DIAGNOSIS — Z23 Encounter for immunization: Secondary | ICD-10-CM | POA: Diagnosis not present

## 2020-07-04 DIAGNOSIS — Z0000 Encounter for general adult medical examination without abnormal findings: Secondary | ICD-10-CM | POA: Diagnosis not present

## 2020-07-04 NOTE — Progress Notes (Signed)
Subjective:   Rion Catala is a 74 y.o. male who presents for Medicare Annual/Subsequent preventive examination.  Review of Systems     Cardiac Risk Factors include: advanced age (>36men, >65 women);male gender;dyslipidemia;hypertension;obesity (BMI >30kg/m2)     Objective:    Today's Vitals   07/04/20 1331  BP: (!) 152/72  Pulse: 73  Resp: 16  Temp: 98 F (36.7 C)  TempSrc: Oral  SpO2: 100%  Weight: 232 lb 12.8 oz (105.6 kg)  Height: 6' (1.829 m)  PainSc: 8    Body mass index is 31.57 kg/m.  Advanced Directives 07/04/2020 07/01/2019 06/09/2017 11/04/2016 03/05/2016  Does Patient Have a Medical Advance Directive? Yes No No No Yes  Type of Paramedic of [place];Living will - - - Living will  Copy of [place] in Chart? No - copy requested - - - No - copy requested  Would patient like information on creating a medical advance directive? - No - Patient declined - - -    Current Medications (verified) Outpatient Encounter Medications as of 07/04/2020  Medication Sig  . aspirin EC 81 MG tablet Take 81 mg by mouth daily.  . cloNIDine (CATAPRES) 0.3 MG tablet Take 1 tablet (0.3 mg total) by mouth 2 (two) times daily.  . hydrochlorothiazide (HYDRODIURIL) 25 MG tablet TAKE 1 TABLET BY MOUTH EVERY DAY  . losartan (COZAAR) 25 MG tablet Take 25 mg by mouth daily.  . magnesium oxide (MAG-OX) 400 MG tablet Take 400 mg by mouth daily.  . meloxicam (MOBIC) 15 MG tablet Take 15 mg by mouth daily.  . Potassium Gluconate 550 MG TABS Take 550 mg by mouth daily.  . pravastatin (PRAVACHOL) 80 MG tablet Take 80 mg by mouth at bedtime.  . [DISCONTINUED] pravastatin (PRAVACHOL) 40 MG tablet Take 1 tablet by mouth daily.   No facility-administered encounter medications on file as of 07/04/2020.    Allergies (verified) Flomax [tamsulosin hcl], Ace inhibitors, Amlodipine, Doxazosin, and Lipitor [atorvastatin]   History: Past Medical History:    Diagnosis Date  . BPH (benign prostatic hypertrophy)   . Bradycardia    evaluated by Dr. Nehemiah Massed  . Cancer [facility]    prostate  . Carpal tunnel syndrome, left    left arm  . CKD (chronic kidney disease) stage 2, GFR 60-89 ml/min   . Diverticulitis   . History of prostate cancer   . HNP (herniated nucleus pulposus), lumbar   . Hyperlipidemia   . Hypertension   . IBS (irritable bowel syndrome)   . Impotence   . Multiple lung nodules    nonspecific, largest 45mm in Jan 2016; scanned March 2017; rescan 3-6 months later  . OA (osteoarthritis) of knee    right  . Renal mass    evaluated; followed by urologist  . Sleep apnea    on CPAP  . Torn meniscus    x 2; right knee   Past Surgical History:  Procedure Laterality Date  . abdominal ultrasound  Oct 2015   Negative for eval for AAA  . COLONOSCOPY    . KNEE ARTHROSCOPY Right    x 2  . LUMBAR LAMINECTOMY    . PROSTATECTOMY     robotic   Family History  Problem Relation Age of Onset  . Stroke Mother   . Hypertension Mother   . Aneurysm Father        cerebral  . Hypertension Father   . Heart disease Paternal Uncle   . Alcohol  abuse Brother   . Cancer Neg Hx   . COPD Neg Hx   . Diabetes Neg Hx    Social History   Socioeconomic History  . Marital status: Married    Spouse name: Not on file  . Number of children: 3  . Years of education: Not on file  . Highest education level: Not on file  Occupational History  . Occupation: retired city of [employer]  . Smoking status: Former Smoker    Packs/day: 0.50    Years: 10.00    Pack years: 5.00    Types: Cigarettes    Quit date: 08/20/1971    Years since quitting: 48.9  . Smokeless tobacco: Never Used  Vaping Use  . Vaping Use: Never used  Substance and Sexual Activity  . Alcohol use: No    Alcohol/week: 0.0 standard drinks  . Drug use: No  . Sexual activity: Not Currently  Other Topics Concern  . Not on file  Social History Narrative   Retired  from city of [employer] 2012.   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    Social Determinants of Health   Financial Resource Strain: Low Risk   . Difficulty of Paying Living Expenses: Not hard at all  Food Insecurity: No Food Insecurity  . Worried About Charity fundraiser in the Last Year: Never true  . Ran Out of Food in the Last Year: Never true  Transportation Needs: No Transportation Needs  . Lack of Transportation (Medical): No  . Lack of Transportation (Non-Medical): No  Physical Activity: Inactive  . Days of Exercise per Week: 0 days  . Minutes of Exercise per Session: 0 min  Stress: No Stress Concern Present  . Feeling of Stress : Not at all  Social Connections: Moderately Integrated  . Frequency of Communication with Friends and Family: More than three times a week  . Frequency of Social Gatherings with Friends and Family: Three times a week  . Attends Religious Services: More than 4 times per year  . Active Member of Clubs or Organizations: No  . Attends Archivist Meetings: Never  . Marital Status: Married    Tobacco Counseling Counseling given: Not Answered   Clinical Intake:  Pre-visit preparation completed: Yes  Pain : 0-10 Pain Score: 8  Pain Type: Chronic pain Pain Location: Leg Pain Orientation: Right, Left Pain Descriptors / Indicators: Aching, Sore Pain Onset: More than a month ago Pain Frequency: Constant     BMI - recorded: 31.57 Nutritional Status: BMI > 30  Obese Nutritional Risks: None Diabetes: No  How often do you need to have someone help you when you read instructions, pamphlets, or other written materials from your doctor or pharmacy?: 1 - Never   Interpreter Needed?: No  Information entered by :: Clemetine Marker LPN   Activities of Daily Living In your present state of health, do you have any difficulty performing the following activities: 07/04/2020 04/19/2020  Hearing? N N  Comment declines hearing aids -   Vision? N N  Difficulty concentrating or making decisions? N N  Walking or climbing stairs? Y N  Dressing or bathing? N N  Doing errands, shopping? N N  Preparing Food and eating ? N -  Using the Toilet? N -  In the past six months, have you accidently leaked urine? N -  Do you have problems with loss of bowel control? N -  Managing your Medications? N -  Managing your Finances? N -  Housekeeping or managing your Housekeeping? N -  Some recent data might be hidden    Patient Care Team: Delsa Grana, PA-C as PCP - General (Family Medicine) Corey Skains, MD as Consulting Physician (Cardiology) Royston Cowper, MD (Urology)  Indicate any recent Medical Services you may have received from other than [facility] providers in the past year (date may be approximate).     Assessment:   This is a routine wellness examination for [place].  Hearing/Vision screen  Hearing Screening   125Hz  250Hz  500Hz  1000Hz  2000Hz  3000Hz  4000Hz  6000Hz  8000Hz   Right ear:           Left ear:           Comments: Pt denies hearing difficulty  Vision Screening Comments: Annual vision screenings done at [place] issues and exercise activities discussed: Current Exercise Habits: The patient does not participate in regular exercise at present, Exercise limited by: Other - see comments (vascular conditions)  Goals    . Weight (lb) < 220 lb (99.8 kg)     Pt states he would like to lose weight over the next year with diet and exercise       Depression Screen PHQ 2/9 Scores 07/04/2020 04/19/2020 03/24/2020 03/08/2020 09/09/2019 07/01/2019 05/25/2019  PHQ - 2 Score 0 0 0 0 0 0 0  PHQ- 9 Score - - 0 0 0 - 0    Fall Risk Fall Risk  07/04/2020 04/19/2020 03/24/2020 03/08/2020 09/09/2019  Falls in the past year? 0 0 0 0 0  Number falls in past yr: 0 0 0 0 0  Injury with Fall? 0 0 0 0 0  Risk for fall due to : History of fall(s) - - - -  Follow up Falls prevention discussed Falls evaluation completed -  Falls evaluation completed Falls evaluation completed    Any stairs in or around the home? Yes  If so, are there any without handrails? No  Home free of loose throw rugs in walkways, pet beds, electrical cords, etc? Yes  Adequate lighting in your home to reduce risk of falls? Yes   ASSISTIVE DEVICES UTILIZED TO PREVENT FALLS:  Life alert? No  Use of a cane, walker or w/c? No  Grab bars in the bathroom? No  Shower chair or bench in shower? No  Elevated toilet seat or a handicapped toilet? No   TIMED UP AND GO:  Was the test performed? Yes .  Length of time to ambulate 10 feet: 5 sec.   Gait steady and fast without use of assistive device  Cognitive Function:     6CIT Screen 07/04/2020  What Year? 0 points  What month? 0 points  What time? 0 points  Count back from 20 0 points  Months in reverse 2 points  Repeat phrase 0 points  Total Score 2    Immunizations Immunization History  Administered Date(s) Administered  . Fluad Quad(high Dose 65+) 05/25/2019, 07/04/2020  . Influenza, High Dose Seasonal PF 06/09/2017, 06/11/2018  . PFIZER SARS-COV-2 Vaccination 10/23/2019, 11/13/2019, 06/16/2020  . Pneumococcal Conjugate-13 06/09/2017  . Pneumococcal Polysaccharide-23 05/21/2011  . Td 08/19/2006    TDAP status: Due, Education has been provided regarding the importance of this vaccine. Advised may receive this vaccine at local pharmacy or Health Dept. Aware to provide a copy of the vaccination record if obtained from local pharmacy or Health Dept. Verbalized acceptance and understanding.   Flu Vaccine status: Completed at  today's visit   Pneumococcal vaccine status: Up to date   Covid-19 vaccine status: Completed vaccines  Qualifies for Shingles Vaccine? Yes   Zostavax completed No   Shingrix Completed?: No.    Education has been provided regarding the importance of this vaccine. Patient has been advised to call insurance company to determine out of pocket expense if  they have not yet received this vaccine. Advised may also receive vaccine at local pharmacy or Health Dept. Verbalized acceptance and understanding.  Screening Tests Health Maintenance  Topic Date Due  . Hepatitis C Screening  Never done  . COLONOSCOPY  12/24/2020  . TETANUS/TDAP  10/17/2023  . INFLUENZA VACCINE  Completed  . COVID-19 Vaccine  Completed  . PNA vac Low Risk Adult  Completed    Health Maintenance  Health Maintenance Due  Topic Date Due  . Hepatitis C Screening  Never done    Colorectal cancer screening: Completed 12/25/15. Repeat every 5 years  Lung Cancer Screening: (Low Dose CT Chest recommended if Age 66-80 years, 30 pack-year currently smoking OR have quit w/in 15years.) does not qualify.   Additional Screening:  Hepatitis C Screening: does qualify; postponed  Vision Screening: Recommended annual ophthalmology exams for early detection of glaucoma and other disorders of the eye. Is the patient up to date with their annual eye exam?  Yes  Who is the provider or what is the name of the office in which the patient attends annual eye exams? [place] Screening: Recommended annual dental exams for proper oral hygiene  Community Resource Referral / Chronic Care Management: CRR required this visit?  No   CCM required this visit?  No      Plan:     I have personally reviewed and noted the following in the patient's chart:   . Medical and social history . Use of alcohol, tobacco or illicit drugs  . Current medications and supplements . Functional ability and status . Nutritional status . Physical activity . Advanced directives . List of other physicians . Hospitalizations, surgeries, and ER visits in previous 12 months . Vitals . Screenings to include cognitive, depression, and falls . Referrals and appointments  In addition, I have reviewed and discussed with patient certain preventive protocols, quality metrics, and best practice  recommendations. A written personalized care plan for preventive services as well as general preventive health recommendations were provided to patient.     Clemetine Marker, LPN   02/16/1600   Nurse Notes: none

## 2020-07-04 NOTE — Patient Instructions (Signed)
[name] , Thank you for taking time to come for your Medicare Wellness Visit. I appreciate your ongoing commitment to your health goals. Please review the following plan we discussed and let me know if I can assist you in the future.   Screening recommendations/referrals: Colonoscopy: done 12/25/15. Repeat in 2022 Recommended yearly ophthalmology/optometry visit for glaucoma screening and checkup Recommended yearly dental visit for hygiene and checkup  Vaccinations: Influenza vaccine: done today  Pneumococcal vaccine: done 06/09/17 Tdap vaccine: due Shingles vaccine: Shingrix discussed. Please contact your pharmacy for coverage information.  Covid-19:  Done 10/23/19, 11/13/19 & 06/16/20  Advanced directives: Please bring a copy of your health care power of attorney and living will to the office at your convenience once you have completed those documents  Conditions/risks identified: Recommend increasing physical activity as tolerated  Next appointment: Follow up in one year for your annual wellness visit.   Preventive Care 75 Years and Older, Male Preventive care refers to lifestyle choices and visits with your health care provider that can promote health and wellness. What does preventive care include?  A yearly physical exam. This is also called an annual well check.  Dental exams once or twice a year.  Routine eye exams. Ask your health care provider how often you should have your eyes checked.  Personal lifestyle choices, including:  Daily care of your teeth and gums.  Regular physical activity.  Eating a healthy diet.  Avoiding tobacco and drug use.  Limiting alcohol use.  Practicing safe sex.  Taking low doses of aspirin every day.  Taking vitamin and mineral supplements as recommended by your health care provider. What happens during an annual well check? The services and screenings done by your health care provider during your annual well check will depend on  your age, overall health, lifestyle risk factors, and family history of disease. Counseling  Your health care provider may ask you questions about your:  Alcohol use.  Tobacco use.  Drug use.  Emotional well-being.  Home and relationship well-being.  Sexual activity.  Eating habits.  History of falls.  Memory and ability to understand (cognition).  Work and work Statistician. Screening  You may have the following tests or measurements:  Height, weight, and BMI.  Blood pressure.  Lipid and cholesterol levels. These may be checked every 5 years, or more frequently if you are over 68 years old.  Skin check.  Lung cancer screening. You may have this screening every year starting at age 54 if you have a 30-pack-year history of smoking and currently smoke or have quit within the past 15 years.  Fecal occult blood test (FOBT) of the stool. You may have this test every year starting at age 30.  Flexible sigmoidoscopy or colonoscopy. You may have a sigmoidoscopy every 5 years or a colonoscopy every 10 years starting at age 6.  Prostate cancer screening. Recommendations will vary depending on your family history and other risks.  Hepatitis C blood test.  Hepatitis B blood test.  Sexually transmitted disease (STD) testing.  Diabetes screening. This is done by checking your blood sugar (glucose) after you have not eaten for a while (fasting). You may have this done every 1-3 years.  Abdominal aortic aneurysm (AAA) screening. You may need this if you are a current or former smoker.  Osteoporosis. You may be screened starting at age 97 if you are at high risk. Talk with your health care provider about your test results, treatment options, and if necessary,  the need for more tests. Vaccines  Your health care provider may recommend certain vaccines, such as:  Influenza vaccine. This is recommended every year.  Tetanus, diphtheria, and acellular pertussis (Tdap, Td) vaccine.  You may need a Td booster every 10 years.  Zoster vaccine. You may need this after age 33.  Pneumococcal 13-valent conjugate (PCV13) vaccine. One dose is recommended after age 27.  Pneumococcal polysaccharide (PPSV23) vaccine. One dose is recommended after age 82. Talk to your health care provider about which screenings and vaccines you need and how often you need them. This information is not intended to replace advice given to you by your health care provider. Make sure you discuss any questions you have with your health care provider. Document Released: 09/01/2015 Document Revised: 04/24/2016 Document Reviewed: 06/06/2015 Elsevier Interactive Patient Education  2017 [place] Prevention in the Home Falls can cause injuries. They can happen to people of all ages. There are many things you can do to make your home safe and to help prevent falls. What can I do on the outside of my home?  Regularly fix the edges of walkways and driveways and fix any cracks.  Remove anything that might make you trip as you walk through a door, such as a raised step or threshold.  Trim any bushes or trees on the path to your home.  Use bright outdoor lighting.  Clear any walking paths of anything that might make someone trip, such as rocks or tools.  Regularly check to see if handrails are loose or broken. Make sure that both sides of any steps have handrails.  Any raised decks and porches should have guardrails on the edges.  Have any leaves, snow, or ice cleared regularly.  Use sand or salt on walking paths during winter.  Clean up any spills in your garage right away. This includes oil or grease spills. What can I do in the bathroom?  Use night lights.  Install grab bars by the toilet and in the tub and shower. Do not use towel bars as grab bars.  Use non-skid mats or decals in the tub or shower.  If you need to sit down in the shower, use a plastic, non-slip stool.  Keep the  floor dry. Clean up any water that spills on the floor as soon as it happens.  Remove soap buildup in the tub or shower regularly.  Attach bath mats securely with double-sided non-slip rug tape.  Do not have throw rugs and other things on the floor that can make you trip. What can I do in the bedroom?  Use night lights.  Make sure that you have a light by your bed that is easy to reach.  Do not use any sheets or blankets that are too big for your bed. They should not hang down onto the floor.  Have a firm chair that has side arms. You can use this for support while you get dressed.  Do not have throw rugs and other things on the floor that can make you trip. What can I do in the kitchen?  Clean up any spills right away.  Avoid walking on wet floors.  Keep items that you use a lot in easy-to-reach places.  If you need to reach something above you, use a strong step stool that has a grab bar.  Keep electrical cords out of the way.  Do not use floor polish or wax that makes floors slippery. If you must use  wax, use non-skid floor wax.  Do not have throw rugs and other things on the floor that can make you trip. What can I do with my stairs?  Do not leave any items on the stairs.  Make sure that there are handrails on both sides of the stairs and use them. Fix handrails that are broken or loose. Make sure that handrails are as long as the stairways.  Check any carpeting to make sure that it is firmly attached to the stairs. Fix any carpet that is loose or worn.  Avoid having throw rugs at the top or bottom of the stairs. If you do have throw rugs, attach them to the floor with carpet tape.  Make sure that you have a light switch at the top of the stairs and the bottom of the stairs. If you do not have them, ask someone to add them for you. What else can I do to help prevent falls?  Wear shoes that:  Do not have high heels.  Have rubber bottoms.  Are comfortable and fit  you well.  Are closed at the toe. Do not wear sandals.  If you use a stepladder:  Make sure that it is fully opened. Do not climb a closed stepladder.  Make sure that both sides of the stepladder are locked into place.  Ask someone to hold it for you, if possible.  Clearly mark and make sure that you can see:  Any grab bars or handrails.  First and last steps.  Where the edge of each step is.  Use tools that help you move around (mobility aids) if they are needed. These include:  Canes.  Walkers.  Scooters.  Crutches.  Turn on the lights when you go into a dark area. Replace any light bulbs as soon as they burn out.  Set up your furniture so you have a clear path. Avoid moving your furniture around.  If any of your floors are uneven, fix them.  If there are any pets around you, be aware of where they are.  Review your medicines with your doctor. Some medicines can make you feel dizzy. This can increase your chance of falling. Ask your doctor what other things that you can do to help prevent falls. This information is not intended to replace advice given to you by your health care provider. Make sure you discuss any questions you have with your health care provider. Document Released: 06/01/2009 Document Revised: 01/11/2016 Document Reviewed: 09/09/2014 Elsevier Interactive Patient Education  2017 [employer].

## 2020-07-21 ENCOUNTER — Ambulatory Visit: Payer: Medicare Other | Admitting: Family Medicine

## 2020-07-21 ENCOUNTER — Encounter: Payer: Self-pay | Admitting: Family Medicine

## 2020-07-21 ENCOUNTER — Other Ambulatory Visit: Payer: Self-pay

## 2020-07-21 VITALS — BP 138/84 | HR 81 | Temp 98.2°F | Resp 16 | Ht 72.0 in | Wt 232.2 lb

## 2020-07-21 DIAGNOSIS — I10 Essential (primary) hypertension: Secondary | ICD-10-CM

## 2020-07-21 DIAGNOSIS — Z5181 Encounter for therapeutic drug level monitoring: Secondary | ICD-10-CM | POA: Diagnosis not present

## 2020-07-21 DIAGNOSIS — E782 Mixed hyperlipidemia: Secondary | ICD-10-CM

## 2020-07-21 DIAGNOSIS — M25561 Pain in right knee: Secondary | ICD-10-CM

## 2020-07-21 DIAGNOSIS — N182 Chronic kidney disease, stage 2 (mild): Secondary | ICD-10-CM

## 2020-07-21 DIAGNOSIS — I739 Peripheral vascular disease, unspecified: Secondary | ICD-10-CM | POA: Diagnosis not present

## 2020-07-21 DIAGNOSIS — I700 Atherosclerosis of aorta: Secondary | ICD-10-CM | POA: Diagnosis not present

## 2020-07-21 DIAGNOSIS — Z9989 Dependence on other enabling machines and devices: Secondary | ICD-10-CM

## 2020-07-21 DIAGNOSIS — M25562 Pain in left knee: Secondary | ICD-10-CM

## 2020-07-21 DIAGNOSIS — Z1159 Encounter for screening for other viral diseases: Secondary | ICD-10-CM

## 2020-07-21 DIAGNOSIS — G4733 Obstructive sleep apnea (adult) (pediatric): Secondary | ICD-10-CM

## 2020-07-21 NOTE — Progress Notes (Signed)
Name: Jared Cline   MRN: [id number]    DOB: March 13, 1946   Date:07/21/2020       Progress Note  Chief Complaint  Patient presents with  . Hyperlipidemia  . Hypertension     Subjective:   Jared Cline is a 74 y.o. male, presents to clinic for routine follow-up on chronic conditions and monitoring of his renal function  Hypertension/CKD:  Losartan 25 mg, HCTZ 25 mg daily and clonidine 0.3mg  BID Pt reports good med compliance and denies any SE.   Blood pressure today is well controlled. BP Readings from Last 3 Encounters:  07/21/20 138/84  07/04/20 (!) 152/72  05/03/20 (!) 177/72   Pt denies CP, SOB, exertional sx, LE edema, palpitation, Ha's, visual disturbances, lightheadedness, hypotension, syncope.  Patient continues to see his cardiologist Dr. Nehemiah Massed- cardiologist twice a year for benign essential hypertension, coronary artery disease, atherosclerosis of abdominal aorta, first-degree AV block, LVH, chronic kidney disease, mixed hyperlipidemia - last visit was 06/21/2020 - reviewed today  Hyperlipidemia Currently treated with pravastatin 20 mg, pt reports good med compliance Last Lipids: Lab Results  Component Value Date   CHOL 167 09/09/2019   HDL 54 09/09/2019   Penn 99 09/09/2019   TRIG 55 09/09/2019   CHOLHDL 3.1 09/09/2019   - Denies: Chest pain, shortness of breath, myalgias, claudication He continues to work on diet and lifestyle for HLD-no change  OSA- compliant with CPAP - still sleeping well  Bilateral knee pain worse with sitting in low seats, bakers cyst and going to Dr. Sabra Heck -ortho   CKD - f/up on renal function after AKI/decline with illness earlier in the year   Current Outpatient Medications:  .  aspirin EC 81 MG tablet, Take 81 mg by mouth daily., Disp: , Rfl:  .  cloNIDine (CATAPRES) 0.3 MG tablet, Take 1 tablet (0.3 mg total) by mouth 2 (two) times daily., Disp: 180 tablet, Rfl: 1 .  hydrochlorothiazide (HYDRODIURIL) 25 MG tablet, TAKE  1 TABLET BY MOUTH EVERY DAY, Disp: 90 tablet, Rfl: 3 .  losartan (COZAAR) 25 MG tablet, Take 25 mg by mouth daily., Disp: , Rfl:  .  magnesium oxide (MAG-OX) 400 MG tablet, Take 400 mg by mouth daily., Disp: , Rfl:  .  meloxicam (MOBIC) 15 MG tablet, Take 15 mg by mouth daily., Disp: , Rfl:  .  Potassium Gluconate 550 MG TABS, Take 550 mg by mouth daily., Disp: , Rfl:  .  pravastatin (PRAVACHOL) 80 MG tablet, Take 80 mg by mouth at bedtime., Disp: , Rfl:   Patient Active Problem List   Diagnosis Date Noted  . Numbness and tingling of both lower extremities 04/19/2020  . Swelling of both lower extremities 04/19/2020  . Numbness of fingers of both hands 04/19/2020  . PVD (peripheral vascular disease) ([place]) 03/24/2020  . Leg pain, bilateral 03/24/2020  . LVH (left ventricular hypertrophy) due to hypertensive disease, without heart failure 05/06/2018  . First degree AV block 04/15/2018  . Coronary artery disease involving native coronary artery of native heart 04/15/2018  . Medication monitoring encounter 06/09/2017  . Coronary artery calcification seen on CAT scan 03/20/2017  . OSA on CPAP 11/22/2015  . Aortic atherosclerosis ([place]) 10/25/2015  . Right renal mass 10/19/2015  . Hyperlipidemia   . Hypertension   . CKD (chronic kidney disease) stage 2, GFR 60-89 ml/min   . Carpal tunnel syndrome, left   . History of prostate cancer   . Multiple lung nodules     Past  Surgical History:  Procedure Laterality Date  . abdominal ultrasound  Oct 2015   Negative for eval for AAA  . COLONOSCOPY    . KNEE ARTHROSCOPY Right    x 2  . LUMBAR LAMINECTOMY    . PROSTATECTOMY     robotic    Family History  Problem Relation Age of Onset  . Stroke Mother   . Hypertension Mother   . Aneurysm Father        cerebral  . Hypertension Father   . Heart disease Paternal Uncle   . Alcohol abuse Brother   . Cancer Neg Hx   . COPD Neg Hx   . Diabetes Neg Hx     Social History   Tobacco Use  .  Smoking status: Former Smoker    Packs/day: 0.50    Years: 10.00    Pack years: 5.00    Types: Cigarettes    Quit date: 08/20/1971    Years since quitting: 48.9  . Smokeless tobacco: Never Used  Vaping Use  . Vaping Use: Never used  Substance Use Topics  . Alcohol use: No    Alcohol/week: 0.0 standard drinks  . Drug use: No     Allergies  Allergen Reactions  . Flomax [Tamsulosin Hcl] Shortness Of Breath  . Ace Inhibitors Other (See Comments)    Angioedema  . Amlodipine Other (See Comments)    Angioedema  . Doxazosin   . Lipitor [Atorvastatin] Other (See Comments)    Memory loss    Health Maintenance  Topic Date Due  . Hepatitis C Screening  Never done  . COLONOSCOPY  12/24/2020  . TETANUS/TDAP  10/17/2023  . INFLUENZA VACCINE  Completed  . COVID-19 Vaccine  Completed  . PNA vac Low Risk Adult  Completed    Chart Review Today: I personally reviewed active problem list, medication list, allergies, family history, social history, health maintenance, notes from last encounter, lab results, imaging with the patient/caregiver today.   Review of Systems  10 Systems reviewed and are negative for acute change except as noted in the HPI.    Objective:   Vitals:   07/21/20 0901  BP: 138/84  Pulse: 81  Resp: 16  Temp: 98.2 F (36.8 C)  SpO2: 97%  Weight: 232 lb 3.2 oz (105.3 kg)  Height: 6' (1.829 m)    Body mass index is 31.49 kg/m.  Physical Exam Vitals and nursing note reviewed.  Constitutional:      General: He is not in acute distress.    Appearance: Normal appearance. He is well-developed. He is not ill-appearing, toxic-appearing or diaphoretic.     Interventions: Face mask in place.  HENT:     Head: Normocephalic and atraumatic.     Jaw: No trismus.     Right Ear: External ear normal.     Left Ear: External ear normal.  Eyes:     General: Lids are normal. No scleral icterus.       Right eye: No discharge.        Left eye: No discharge.      Conjunctiva/sclera: Conjunctivae normal.  Neck:     Trachea: Trachea and phonation normal. No tracheal deviation.  Cardiovascular:     Rate and Rhythm: Normal rate and regular rhythm.     Pulses: Normal pulses.          Radial pulses are 2+ on the right side and 2+ on the left side.       Posterior tibial pulses  are 2+ on the right side and 2+ on the left side.     Heart sounds: Normal heart sounds. No murmur heard.  No friction rub. No gallop.   Pulmonary:     Effort: Pulmonary effort is normal. No respiratory distress.     Breath sounds: Normal breath sounds. No stridor. No wheezing, rhonchi or rales.  Abdominal:     General: Bowel sounds are normal. There is no distension.     Palpations: Abdomen is soft.  Skin:    General: Skin is warm and dry.     Coloration: Skin is not jaundiced.     Findings: No rash.     Nails: There is no clubbing.  Neurological:     Mental Status: He is alert. Mental status is at baseline.     Cranial Nerves: No dysarthria or facial asymmetry.     Motor: No tremor or abnormal muscle tone.     Gait: Gait abnormal.  Psychiatric:        Mood and Affect: Mood normal.        Speech: Speech normal.        Behavior: Behavior normal. Behavior is cooperative.         Assessment & Plan:     ICD-10-CM   1. Primary hypertension  M75 COMPLETE METABOLIC PANEL WITH GFR   Blood pressure today is at goal and well controlled for age, blood pressure previously was elevated, continue current medications  2. Mixed hyperlipidemia  Q49.2 COMPLETE METABOLIC PANEL WITH GFR    Lipid panel   Good statin compliance, no myalgias fatigue or concerning side effects, leg pain likely muscle skeletal  3. PAD (peripheral artery disease) ([facility])  E10.0 COMPLETE METABOLIC PANEL WITH GFR    Lipid panel   Compliant with statin, managed by vascular  4. Aortic atherosclerosis ([facility])  F12.1 COMPLETE METABOLIC PANEL WITH GFR    Lipid panel   On statin, monitoring  5. Encounter for  hepatitis C screening test for low risk patient  Z11.59 Hepatitis C Antibody    Hepatitis C antibody  6. CKD (chronic kidney disease) stage 2, GFR 60-89 ml/min  F75.8 COMPLETE METABOLIC PANEL WITH GFR   Recheck renal function, blood pressure today well controlled, avoid NSAIDs and remain well-hydrated  7. Pain in both knees, unspecified chronicity  M25.561    M25.562    Reviewed patient's past office visits and work-up, seeing orthopedics  8. OSA on CPAP  G47.33 CBC with Differential/Platelet   Z99.89    Good compliance, feels well rested  9. Encounter for medication monitoring  Z51.81 CBC with Differential/Platelet    COMPLETE METABOLIC PANEL WITH GFR    Lipid panel     Return in about 6 months (around 01/19/2021) for routine follow up.   Delsa Grana, PA-C 07/21/20 9:05 AM

## 2020-07-21 NOTE — Patient Instructions (Signed)
Please come back in 4-6 months for routine follow up (come sooner if needed for any new symptoms or concerns)   Dyslipidemia Dyslipidemia is an imbalance of waxy, fat-like substances (lipids) in the blood. The body needs lipids in small amounts. Dyslipidemia often involves a high level of cholesterol or triglycerides, which are types of lipids. Common forms of dyslipidemia include:  High levels of LDL cholesterol. LDL is the type of cholesterol that causes fatty deposits (plaques) to build up in the blood vessels that carry blood away from your heart (arteries).  Low levels of HDL cholesterol. HDL cholesterol is the type of cholesterol that protects against heart disease. High levels of HDL remove the LDL buildup from arteries.  High levels of triglycerides. Triglycerides are a fatty substance in the blood that is linked to a buildup of plaques in the arteries. What are the causes? Primary dyslipidemia is caused by changes (mutations) in genes that are passed down through families (inherited). These mutations cause several types of dyslipidemia. Secondary dyslipidemia is caused by lifestyle choices and diseases that lead to dyslipidemia, such as:  Eating a diet that is high in animal fat.  Not getting enough exercise.  Having diabetes, kidney disease, liver disease, or thyroid disease.  Drinking large amounts of alcohol.  Using certain medicines. What increases the risk? You are more likely to develop this condition if you are an older man or if you are a woman who has gone through menopause. Other risk factors include:  Having a family history of dyslipidemia.  Taking certain medicines, including birth control pills, steroids, some diuretics, and beta-blockers.  Smoking cigarettes.  Eating a high-fat diet.  Having certain medical conditions such as diabetes, polycystic ovary syndrome (PCOS), kidney disease, liver disease, or hypothyroidism.  Not exercising regularly.  Being  overweight or obese with too much belly fat. What are the signs or symptoms? In most cases, dyslipidemia does not usually cause any symptoms. In severe cases, very high lipid levels can cause:  Fatty bumps under the skin (xanthomas).  White or gray ring around the black center (pupil) of the eye. Very high triglyceride levels can cause inflammation of the pancreas (pancreatitis). How is this diagnosed? Your health care provider may diagnose dyslipidemia based on a routine blood test (fasting blood test). Because most people do not have symptoms of the condition, this blood testing (lipid profile) is done on adults age 15 and older and is repeated every 5 years. This test checks:  Total cholesterol. This measures the total amount of cholesterol in your blood, including LDL cholesterol, HDL cholesterol, and triglycerides. A healthy number is below 200.  LDL cholesterol. The target number for LDL cholesterol is different for each person, depending on individual risk factors. Ask your health care provider what your LDL cholesterol should be.  HDL cholesterol. An HDL level of 60 or higher is best because it helps to protect against heart disease. A number below 34 for men or below 69 for women increases the risk for heart disease.  Triglycerides. A healthy triglyceride number is below 150. If your lipid profile is abnormal, your health care provider may do other blood tests. How is this treated? Treatment depends on the type of dyslipidemia that you have and your other risk factors for heart disease and stroke. Your health care provider will have a target range for your lipid levels based on this information. For many people, this condition may be treated by lifestyle changes, such as diet and exercise. Your  health care provider may recommend that you:  Get regular exercise.  Make changes to your diet.  Quit smoking if you smoke. If diet changes and exercise do not help you reach your goals,  your health care provider may also prescribe medicine to lower lipids. The most commonly prescribed type of medicine lowers your LDL cholesterol (statin drug). If you have a high triglyceride level, your provider may prescribe another type of drug (fibrate) or an omega-3 fish oil supplement, or both. Follow these instructions at home:  Eating and drinking  Follow instructions from your health care provider or dietitian about eating or drinking restrictions.  Eat a healthy diet as told by your health care provider. This can help you reach and maintain a healthy weight, lower your LDL cholesterol, and raise your HDL cholesterol. This may include: ? Limiting your calories, if you are overweight. ? Eating more fruits, vegetables, whole grains, fish, and lean meats. ? Limiting saturated fat, trans fat, and cholesterol.  If you drink alcohol: ? Limit how much you use. ? Be aware of how much alcohol is in your drink. In the [place] one drink equals one 12 oz bottle of beer (355 mL), one 5 oz glass of wine (148 mL), or one 1 oz glass of hard liquor (44 mL).  Do not drink alcohol if: ? Your health care provider tells you not to drink. ? You are pregnant, may be pregnant, or are planning to become pregnant. Activity  Get regular exercise. Start an exercise and strength training program as told by your health care provider. Ask your health care provider what activities are safe for you. Your health care provider may recommend: ? 30 minutes of aerobic activity 4-6 days a week. Brisk walking is an example of aerobic activity. ? Strength training 2 days a week. General instructions  Do not use any products that contain nicotine or tobacco, such as cigarettes, e-cigarettes, and chewing tobacco. If you need help quitting, ask your health care provider.  Take over-the-counter and prescription medicines only as told by your health care provider. This includes supplements.  Keep all follow-up visits as  told by your health care provider. Contact a health care provider if:  You are: ? Having trouble sticking to your exercise or diet plan. ? Struggling to quit smoking or control your use of alcohol. Summary  Dyslipidemia often involves a high level of cholesterol or triglycerides, which are types of lipids.  Treatment depends on the type of dyslipidemia that you have and your other risk factors for heart disease and stroke.  For many people, treatment starts with lifestyle changes, such as diet and exercise.  Your health care provider may prescribe medicine to lower lipids. This information is not intended to replace advice given to you by your health care provider. Make sure you discuss any questions you have with your health care provider. Document Revised: 03/30/2018 Document Reviewed: 03/06/2018 Elsevier Patient Education  [place].

## 2020-07-24 DIAGNOSIS — M170 Bilateral primary osteoarthritis of knee: Secondary | ICD-10-CM | POA: Diagnosis not present

## 2020-07-24 LAB — COMPLETE METABOLIC PANEL WITH GFR
AG Ratio: 1.7 (calc) (ref 1.0–2.5)
ALT: 11 U/L (ref 9–46)
AST: 16 U/L (ref 10–35)
Albumin: 4.7 g/dL (ref 3.6–5.1)
Alkaline phosphatase (APISO): 60 U/L (ref 35–144)
BUN/Creatinine Ratio: 16 (calc) (ref 6–22)
BUN: 22 mg/dL (ref 7–25)
CO2: 31 mmol/L (ref 20–32)
Calcium: 10.3 mg/dL (ref 8.6–10.3)
Chloride: 100 mmol/L (ref 98–110)
Creat: 1.4 mg/dL — ABNORMAL HIGH (ref 0.70–1.18)
GFR, Est African American: 57 mL/min/{1.73_m2} — ABNORMAL LOW (ref >=60)
GFR, Est Non African American: 49 mL/min/{1.73_m2} — ABNORMAL LOW (ref >=60)
Globulin: 2.7 g/dL (calc) (ref 1.9–3.7)
Glucose, Bld: 81 mg/dL (ref 65–99)
Potassium: 5.3 mmol/L (ref 3.5–5.3)
Sodium: 137 mmol/L (ref 135–146)
Total Bilirubin: 0.6 mg/dL (ref 0.2–1.2)
Total Protein: 7.4 g/dL (ref 6.1–8.1)

## 2020-07-24 LAB — CBC WITH DIFFERENTIAL/PLATELET
Absolute Monocytes: 362 cells/uL (ref 200–950)
Basophils Absolute: 42 cells/uL (ref 0–200)
Basophils Relative: 0.9 %
Eosinophils Absolute: 99 cells/uL (ref 15–500)
Eosinophils Relative: 2.1 %
HCT: 41.8 % (ref 38.5–50.0)
Hemoglobin: 13.5 g/dL (ref 13.2–17.1)
Lymphs Abs: 1711 cells/uL (ref 850–3900)
MCH: 27.7 pg (ref 27.0–33.0)
MCHC: 32.3 g/dL (ref 32.0–36.0)
MCV: 85.8 fL (ref 80.0–100.0)
MPV: 9.9 fL (ref 7.5–12.5)
Monocytes Relative: 7.7 %
Neutro Abs: 2486 cells/uL (ref 1500–7800)
Neutrophils Relative %: 52.9 %
Platelets: 338 10*3/uL (ref 140–400)
RBC: 4.87 10*6/uL (ref 4.20–5.80)
RDW: 12 % (ref 11.0–15.0)
Total Lymphocyte: 36.4 %
WBC: 4.7 10*3/uL (ref 3.8–10.8)

## 2020-07-24 LAB — LIPID PANEL
Cholesterol: 154 mg/dL (ref <200)
HDL: 53 mg/dL (ref >=40)
LDL Cholesterol (Calc): 86 mg/dL (calc)
Non-HDL Cholesterol (Calc): 101 mg/dL (calc) (ref <130)
Total CHOL/HDL Ratio: 2.9 (calc) (ref <5.0)
Triglycerides: 66 mg/dL (ref <150)

## 2020-07-24 LAB — HEPATITIS C ANTIBODY
Hepatitis C Ab: NONREACTIVE
SIGNAL TO CUT-OFF: 0.04 (ref <1.00)

## 2020-07-26 ENCOUNTER — Encounter: Payer: Self-pay | Admitting: Family Medicine

## 2020-07-26 ENCOUNTER — Encounter: Payer: Self-pay | Admitting: Emergency Medicine

## 2020-09-07 ENCOUNTER — Other Ambulatory Visit: Payer: Self-pay | Admitting: Family Medicine

## 2020-09-07 MED ORDER — CLONIDINE HCL 0.3 MG PO TABS
0.3000 mg | ORAL_TABLET | Freq: Two times a day (BID) | ORAL | 1 refills | Status: DC
Start: 2020-09-07 — End: 2021-03-12

## 2020-09-07 NOTE — Telephone Encounter (Signed)
Medication:  cloNIDine (CATAPRES) 0.3 MG tablet [[id number]]   Has the patient contacted their pharmacy? Yes  ( (Agent: If yes, when and what did the pharmacy advise?) to call the office   Preferred Pharmacy (with phone number or street name):  [id number] [address], [place]  [place], [place] [address]  Phone:  [phone] Fax:  [phone]  Agent: Please be advised that RX refills may take up to 3 business days. We ask that you follow-up with your pharmacy.

## 2020-09-08 ENCOUNTER — Ambulatory Visit: Payer: Medicare Other | Admitting: Family Medicine

## 2020-10-09 ENCOUNTER — Encounter (INDEPENDENT_AMBULATORY_CARE_PROVIDER_SITE_OTHER): Payer: Medicare Other

## 2020-10-09 ENCOUNTER — Ambulatory Visit (INDEPENDENT_AMBULATORY_CARE_PROVIDER_SITE_OTHER): Payer: Medicare Other | Admitting: Vascular Surgery

## 2020-11-01 ENCOUNTER — Other Ambulatory Visit (INDEPENDENT_AMBULATORY_CARE_PROVIDER_SITE_OTHER): Payer: Self-pay | Admitting: Nurse Practitioner

## 2020-11-01 DIAGNOSIS — I739 Peripheral vascular disease, unspecified: Secondary | ICD-10-CM

## 2020-11-02 ENCOUNTER — Ambulatory Visit (INDEPENDENT_AMBULATORY_CARE_PROVIDER_SITE_OTHER): Payer: Medicare Other | Admitting: Vascular Surgery

## 2020-11-02 ENCOUNTER — Encounter (INDEPENDENT_AMBULATORY_CARE_PROVIDER_SITE_OTHER): Payer: Medicare Other

## 2020-11-02 ENCOUNTER — Encounter (INDEPENDENT_AMBULATORY_CARE_PROVIDER_SITE_OTHER): Payer: Self-pay

## 2020-11-02 NOTE — Progress Notes (Deleted)
MRN : [id number]  Jared Cline is a 75 y.o. ([birth year]) male who presents with chief complaint of No chief complaint on file. Jared Cline  History of Present Illness:   The patient returns to the office for followup and review of the noninvasive studies. There have been no interval changes in lower extremity symptoms. No interval shortening of the patient's claudication distance or development of rest pain symptoms. No new ulcers or wounds have occurred since the last visit.  There have been no significant changes to the patient's overall health care.  The patient denies amaurosis fugax or recent TIA symptoms. There are no recent neurological changes noted. The patient denies history of DVT, PE or superficial thrombophlebitis. The patient denies recent episodes of angina or shortness of breath.   ABI Rt=*** and Lt=***  (previous ABI's Rt=*** and Lt=***) Duplex ultrasound of the ***  No outpatient medications have been marked as taking for the 11/02/20 encounter (Appointment) with Delana Meyer, Dolores Lory, MD.    Past Medical History:  Diagnosis Date  . BPH (benign prostatic hypertrophy)   . Bradycardia    evaluated by Dr. Nehemiah Massed  . Cancer [facility])    prostate  . Carpal tunnel syndrome, left    left arm  . CKD (chronic kidney disease) stage 2, GFR 60-89 ml/min   . Diverticulitis   . History of prostate cancer   . HNP (herniated nucleus pulposus), lumbar   . Hyperlipidemia   . Hypertension   . IBS (irritable bowel syndrome)   . Impotence   . Multiple lung nodules    nonspecific, largest 9mm in Jan 2016; scanned March 2017; rescan 3-6 months later  . OA (osteoarthritis) of knee    right  . Renal mass    evaluated; followed by urologist  . Sleep apnea    on CPAP  . Torn meniscus    x 2; right knee    Past Surgical History:  Procedure Laterality Date  . abdominal ultrasound  Oct 2015   Negative for eval for AAA  . COLONOSCOPY    . KNEE ARTHROSCOPY Right    x 2  . LUMBAR  LAMINECTOMY    . PROSTATECTOMY     robotic    Social History Social History   Tobacco Use  . Smoking status: Former Smoker    Packs/day: 0.50    Years: 10.00    Pack years: 5.00    Types: Cigarettes    Quit date: 08/20/1971    Years since quitting: 49.2  . Smokeless tobacco: Never Used  Vaping Use  . Vaping Use: Never used  Substance Use Topics  . Alcohol use: No    Alcohol/week: 0.0 standard drinks  . Drug use: No    Family History Family History  Problem Relation Age of Onset  . Stroke Mother   . Hypertension Mother   . Aneurysm Father        cerebral  . Hypertension Father   . Heart disease Paternal Uncle   . Alcohol abuse Brother   . Cancer Neg Hx   . COPD Neg Hx   . Diabetes Neg Hx     Allergies  Allergen Reactions  . Flomax [Tamsulosin Hcl] Shortness Of Breath  . Ace Inhibitors Other (See Comments)    Angioedema  . Amlodipine Other (See Comments)    Angioedema  . Doxazosin   . Lipitor [Atorvastatin] Other (See Comments)    Memory loss     REVIEW OF SYSTEMS (Negative unless checked)  Constitutional: [] Weight loss  [] Fever  [] Chills Cardiac: [] Chest pain   [] Chest pressure   [] Palpitations   [] Shortness of breath when laying flat   [] Shortness of breath with exertion. Vascular:  [] Pain in legs with walking   [] Pain in legs at rest  [] History of DVT   [] Phlebitis   [] Swelling in legs   [] Varicose veins   [] Non-healing ulcers Pulmonary:   [] Uses home oxygen   [] Productive cough   [] Hemoptysis   [] Wheeze  [] COPD   [] Asthma Neurologic:  [] Dizziness   [] Seizures   [] History of stroke   [] History of TIA  [] Aphasia   [] Vissual changes   [] Weakness or numbness in arm   [] Weakness or numbness in leg Musculoskeletal:   [] Joint swelling   [] Joint pain   [] Low back pain Hematologic:  [] Easy bruising  [] Easy bleeding   [] Hypercoagulable state   [] Anemic Gastrointestinal:  [] Diarrhea   [] Vomiting  [] Gastroesophageal reflux/heartburn   [] Difficulty  swallowing. Genitourinary:  [] Chronic kidney disease   [] Difficult urination  [] Frequent urination   [] Blood in urine Skin:  [] Rashes   [] Ulcers  Psychological:  [] History of anxiety   []  History of major depression.  Physical Examination  There were no vitals filed for this visit. There is no height or weight on file to calculate BMI. Gen: WD/WN, NAD Head: [place]/AT, No temporalis wasting.  Ear/Nose/Throat: Hearing grossly intact, nares w/o erythema or drainage Eyes: PER, EOMI, sclera nonicteric.  Neck: Supple, no large masses.   Pulmonary:  Good air movement, no audible wheezing bilaterally, no use of accessory muscles.  Cardiac: RRR, no JVD Vascular: *** Vessel Right Left  Radial Palpable Palpable  Ulnar Palpable Palpable  Brachial Palpable Palpable  Carotid Palpable Palpable  Femoral Palpable Palpable  Popliteal Palpable Palpable  PT Palpable Palpable  DP Palpable Palpable  Gastrointestinal: Non-distended. No guarding/no peritoneal signs.  Musculoskeletal: M/S 5/5 throughout.  No deformity or atrophy.  Neurologic: CN 2-12 intact. Symmetrical.  Speech is fluent. Motor exam as listed above. Psychiatric: Judgment intact, Mood & affect appropriate for pt's clinical situation. Dermatologic: No rashes or ulcers noted.  No changes consistent with cellulitis. Lymph : No lichenification or skin changes of chronic lymphedema.  CBC Lab Results  Component Value Date   WBC 4.7 07/21/2020   HGB 13.5 07/21/2020   HCT 41.8 07/21/2020   MCV 85.8 07/21/2020   PLT 338 07/21/2020    BMET    Component Value Date/Time   NA 137 07/21/2020 0930   NA 135 10/19/2015 1048   NA 133 (L) 08/25/2014 1020   K 5.3 07/21/2020 0930   K 4.1 08/25/2014 1020   CL 100 07/21/2020 0930   CL 100 08/25/2014 1020   CO2 31 07/21/2020 0930   CO2 27 08/25/2014 1020   GLUCOSE 81 07/21/2020 0930   GLUCOSE 95 08/25/2014 1020   BUN 22 07/21/2020 0930   BUN 12 10/19/2015 1048   BUN 13 08/25/2014 1020    CREATININE 1.40 (H) 07/21/2020 0930   CALCIUM 10.3 07/21/2020 0930   CALCIUM 9.1 08/25/2014 1020   GFRNONAA 49 (L) 07/21/2020 0930   GFRAA 57 (L) 07/21/2020 0930   CrCl cannot be calculated (Patient's most recent lab result is older than the maximum 21 days allowed.).  COAG No results found for: INR, PROTIME  Radiology No results found.  Outside Studies/Documentation *** pages of outside documents were reviewed.  They showed ***.  Assessment/Plan There are no diagnoses linked to this encounter.   Hortencia Pilar, MD  11/02/2020 1:02 PM

## 2020-12-07 ENCOUNTER — Telehealth: Payer: Self-pay

## 2020-12-07 NOTE — Telephone Encounter (Signed)
Called and spoke with the patient's wife. Patient has established care with [facility] and is no longer our patient. Explained that the patient is still listed as our office being his PCP. Asked for the patient's wife to please have the patient reach out to his insurance and make sure they have the correct PCP on file for him. Patient's wife verbalized understanding.

## 2020-12-28 DIAGNOSIS — M170 Bilateral primary osteoarthritis of knee: Secondary | ICD-10-CM | POA: Diagnosis not present

## 2021-01-19 ENCOUNTER — Ambulatory Visit: Payer: Medicare Other | Admitting: Family Medicine

## 2021-01-23 ENCOUNTER — Ambulatory Visit: Payer: Medicare Other | Admitting: Family Medicine

## 2021-01-25 ENCOUNTER — Encounter: Payer: Self-pay | Admitting: Unknown Physician Specialty

## 2021-01-25 ENCOUNTER — Ambulatory Visit (INDEPENDENT_AMBULATORY_CARE_PROVIDER_SITE_OTHER): Payer: Medicare Other | Admitting: Unknown Physician Specialty

## 2021-01-25 ENCOUNTER — Other Ambulatory Visit: Payer: Self-pay

## 2021-01-25 VITALS — BP 132/84 | HR 84 | Temp 98.2°F | Resp 16 | Ht 72.0 in | Wt 229.9 lb

## 2021-01-25 DIAGNOSIS — E782 Mixed hyperlipidemia: Secondary | ICD-10-CM | POA: Diagnosis not present

## 2021-01-25 DIAGNOSIS — Z1211 Encounter for screening for malignant neoplasm of colon: Secondary | ICD-10-CM

## 2021-01-25 DIAGNOSIS — I10 Essential (primary) hypertension: Secondary | ICD-10-CM

## 2021-01-25 DIAGNOSIS — N1831 Chronic kidney disease, stage 3a: Secondary | ICD-10-CM

## 2021-01-25 LAB — COMPREHENSIVE METABOLIC PANEL
AG Ratio: 1.8 (calc) (ref 1.0–2.5)
ALT: 11 U/L (ref 9–46)
AST: 15 U/L (ref 10–35)
Albumin: 4.3 g/dL (ref 3.6–5.1)
Alkaline phosphatase (APISO): 62 U/L (ref 35–144)
BUN/Creatinine Ratio: 11 (calc) (ref 6–22)
BUN: 15 mg/dL (ref 7–25)
CO2: 30 mmol/L (ref 20–32)
Calcium: 9.9 mg/dL (ref 8.6–10.3)
Chloride: 103 mmol/L (ref 98–110)
Creat: 1.42 mg/dL — ABNORMAL HIGH (ref 0.70–1.18)
Globulin: 2.4 g/dL (calc) (ref 1.9–3.7)
Glucose, Bld: 90 mg/dL (ref 65–99)
Potassium: 4.7 mmol/L (ref 3.5–5.3)
Sodium: 138 mmol/L (ref 135–146)
Total Bilirubin: 0.4 mg/dL (ref 0.2–1.2)
Total Protein: 6.7 g/dL (ref 6.1–8.1)

## 2021-01-25 NOTE — Assessment & Plan Note (Signed)
Stable, continue present medications. Recommended Meloxicam use only occasional.  Tylenol a safer choice

## 2021-01-25 NOTE — Assessment & Plan Note (Addendum)
Will check CMP today. GFR stable in the 50s.  Avoid Meloxicam as possible

## 2021-01-25 NOTE — Progress Notes (Signed)
BP 132/84   Pulse 84   Temp 98.2 F (36.8 C)   Resp 16   Ht 6' (1.829 m)   Wt 229 lb 14.4 oz (104.3 kg)   SpO2 98%   BMI 31.18 kg/m    Subjective:    Patient ID: Jared Cline, male    DOB: 02/12/1946, 75 y.o.   MRN: [id number]  HPI: Jared Cline is a 75 y.o. male  Chief Complaint  Patient presents with   Follow-up   Hypertension   Hyperlipidemia   Hypertension Using medications without difficulty Average home BPs Not checking   No problems or lightheadedness No chest pain with exertion or shortness of breath No Edema  Takes Meloxicam due to OA bilateral knees   Hyperlipidemia Using medications without problems: No Muscle aches  Diet compliance: Exercise:Watches diet.  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 Interim medical history since our last visit reviewed. Allergies and medications reviewed and updated.  Review of Systems  Per HPI unless specifically indicated above     Objective:    BP 132/84   Pulse 84   Temp 98.2 F (36.8 C)   Resp 16   Ht 6' (1.829 m)   Wt 229 lb 14.4 oz (104.3 kg)   SpO2 98%   BMI 31.18 kg/m   Wt Readings from Last 3 Encounters:  01/25/21 229 lb 14.4 oz (104.3 kg)  07/21/20 232 lb 3.2 oz (105.3 kg)  07/04/20 232 lb 12.8 oz (105.6 kg)    Physical Exam Constitutional:      General: He is not in acute distress.    Appearance: Normal appearance. He is well-developed.  HENT:     Head: Normocephalic and atraumatic.  Eyes:     General: Lids are normal. No scleral icterus.       Right eye: No discharge.        Left eye: No discharge.     Conjunctiva/sclera: Conjunctivae normal.  Neck:     Vascular: No carotid bruit or JVD.  Cardiovascular:     Rate and Rhythm: Normal rate and regular rhythm.     Heart sounds: Normal heart sounds.  Pulmonary:     Effort: Pulmonary effort is normal. No respiratory distress.     Breath sounds:  Normal breath sounds.  Abdominal:     Palpations: There is no hepatomegaly or splenomegaly.  Musculoskeletal:        General: Normal range of motion.     Cervical back: Normal range of motion and neck supple.  Skin:    General: Skin is warm and dry.     Coloration: Skin is not pale.     Findings: No rash.  Neurological:     Mental Status: He is alert and oriented to person, place, and time.  Psychiatric:        Behavior: Behavior normal.        Thought Content: Thought content normal.        Judgment: Judgment normal.    Results for orders placed or performed in visit on 07/21/20  CBC with Differential/Platelet  Result Value Ref Range   WBC 4.7 3.8 - 10.8 Thousand/uL   RBC 4.87 4.20 - 5.80 Million/uL   Hemoglobin 13.5 13.2 - 17.1 g/dL   HCT 41.8 38.5 - 50.0 %   MCV 85.8 80.0 - 100.0 fL   MCH 27.7 27.0 - 33.0 pg   MCHC  32.3 32.0 - 36.0 g/dL   RDW 12.0 11.0 - 15.0 %   Platelets 338 140 - 400 Thousand/uL   MPV 9.9 7.5 - 12.5 fL   Neutro Abs 2,486 1,500 - 7,800 cells/uL   Lymphs Abs 1,711 850 - 3,900 cells/uL   Absolute Monocytes 362 200 - 950 cells/uL   Eosinophils Absolute 99 15 - 500 cells/uL   Basophils Absolute 42 0 - 200 cells/uL   Neutrophils Relative % 52.9 %   Total Lymphocyte 36.4 %   Monocytes Relative 7.7 %   Eosinophils Relative 2.1 %   Basophils Relative 0.9 %  COMPLETE METABOLIC PANEL WITH GFR  Result Value Ref Range   Glucose, Bld 81 65 - 99 mg/dL   BUN 22 7 - 25 mg/dL   Creat 1.40 (H) 0.70 - 1.18 mg/dL   GFR, Est Non African American 49 (L) > OR = 60 mL/min/1.82m2   GFR, Est African American 57 (L) > OR = 60 mL/min/1.31m2   BUN/Creatinine Ratio 16 6 - 22 (calc)   Sodium 137 135 - 146 mmol/L   Potassium 5.3 3.5 - 5.3 mmol/L   Chloride 100 98 - 110 mmol/L   CO2 31 20 - 32 mmol/L   Calcium 10.3 8.6 - 10.3 mg/dL   Total Protein 7.4 6.1 - 8.1 g/dL   Albumin 4.7 3.6 - 5.1 g/dL   Globulin 2.7 1.9 - 3.7 g/dL (calc)   AG Ratio 1.7 1.0 - 2.5 (calc)   Total  Bilirubin 0.6 0.2 - 1.2 mg/dL   Alkaline phosphatase (APISO) 60 35 - 144 U/L   AST 16 10 - 35 U/L   ALT 11 9 - 46 U/L  Lipid panel  Result Value Ref Range   Cholesterol 154 <200 mg/dL   HDL 53 > OR = 40 mg/dL   Triglycerides 66 <150 mg/dL   LDL Cholesterol (Calc) 86 mg/dL (calc)   Total CHOL/HDL Ratio 2.9 <5.0 (calc)   Non-HDL Cholesterol (Calc) 101 <130 mg/dL (calc)  Hepatitis C antibody  Result Value Ref Range   Hepatitis C Ab NON-REACTIVE NON-REACTI   SIGNAL TO CUT-OFF 0.04 <1.00      Assessment & Plan:   Problem List Items Addressed This Visit       Unprioritized   Hyperlipidemia (Chronic)    Last labs stable.  Will continue present dose       Hypertension (Chronic)    Stable, continue present medications. Recommended Meloxicam use only occasional.  Tylenol a safer choice       Chronic kidney disease, stage 3 (HCC)    Will check CMP today. GFR stable in the 50s.  Avoid Meloxicam as possible       Other Visit Diagnoses     Screening for colon cancer    -  Primary        Follow up plan: No follow-ups on file.

## 2021-01-25 NOTE — Assessment & Plan Note (Signed)
Last labs stable.  Will continue present dose

## 2021-02-14 ENCOUNTER — Other Ambulatory Visit: Payer: Self-pay

## 2021-02-14 ENCOUNTER — Encounter: Payer: Self-pay | Admitting: Family Medicine

## 2021-02-14 ENCOUNTER — Ambulatory Visit: Payer: Medicare Other | Admitting: Family Medicine

## 2021-02-14 DIAGNOSIS — R42 Dizziness and giddiness: Secondary | ICD-10-CM | POA: Diagnosis not present

## 2021-02-14 MED ORDER — VALSARTAN 80 MG PO TABS: 80.0000 mg | ORAL_TABLET | Freq: Every day | ORAL | 3 refills | Status: DC

## 2021-02-14 NOTE — Patient Instructions (Signed)
It was great to see you!  Our plans for today:  - Take the new blood pressure medication. Continue to take the clonidine and Hydrochlorathiazide. - Keep an eye on your blood pressure and let us know if the top number falls below 100.  - Come back in 1 month, we will do labs at that visit.   Take care and seek immediate care sooner if you develop any concerns.   Dr. Ky Barban

## 2021-02-14 NOTE — Progress Notes (Signed)
    SUBJECTIVE:   CHIEF COMPLAINT / HPI:   DIZZINESS - also with ankles tight. - intermittent - has noted before with higher BP. No changes to meds recently, no missed doses, although not taking losartan, not sure how long not taking.  Duration:  2 weeks Description of symptoms:  lightheaded, off-kilter Provoking factors: none Aggravating factors:  none Triggered by rolling over in bed: no Triggered by bending over: yes Aggravated by head movement: no Aggravated by exertion, coughing, loud noises: no Recent head injury: no Recent or current viral symptoms: no History of vasovagal episodes: no Nausea: no Vomiting: no Tinnitus: no Hearing loss: no Aural fullness: no Headache: no Photophobia/phonophobia:  occasional phonophobia Unsteady gait: yes Diplopia, dysarthria, dysphagia or weakness: no Related to exertion: no Diaphoresis: no Dyspnea: no Chest pain: no   OBJECTIVE:   BP (!) 158/84   Pulse 68   Temp 98.3 F (36.8 C) (Oral)   Resp 18   Ht 6' (1.829 m)   Wt 229 lb 6.4 oz (104.1 kg)   SpO2 99%   BMI 31.11 kg/m   Gen: well appearing, in NAD Card: RRR, no LE edema Lungs: CTAB MSK: Full ROM, strength 5/5 to U/LE bilaterally, deep reflexes intact, normal gait.  No edema.  Neuro: Alert and oriented, speech normal.  Optic field normal. Extraocular movements intact.  Intact symmetric sensation to light touch of face and extremities bilaterally.  Hearing grossly intact bilaterally.  Tongue protrudes normally with no deviation.  Shoulder shrug, smile symmetric. Finger to nose normal. Dix Hallpike negative.   ASSESSMENT/PLAN:   Dizziness Likely 2/2 higher BP given noticed with prior episodes and elevated on exam today. Neuro and cardiac exam otherwise wnl. Will restart losartan and recommend monitoring at home BP. F/u in 1 month. Emergency precautions discussed.    Myles Gip, DO

## 2021-02-14 NOTE — Assessment & Plan Note (Signed)
Likely 2/2 higher BP given noticed with prior episodes and elevated on exam today. Neuro and cardiac exam otherwise wnl. Will restart losartan and recommend monitoring at home BP. F/u in 1 month. Emergency precautions discussed.

## 2021-02-27 DIAGNOSIS — I10 Essential (primary) hypertension: Secondary | ICD-10-CM | POA: Diagnosis not present

## 2021-02-27 DIAGNOSIS — I2510 Atherosclerotic heart disease of native coronary artery without angina pectoris: Secondary | ICD-10-CM | POA: Diagnosis not present

## 2021-02-27 DIAGNOSIS — I119 Hypertensive heart disease without heart failure: Secondary | ICD-10-CM | POA: Diagnosis not present

## 2021-02-27 DIAGNOSIS — I700 Atherosclerosis of aorta: Secondary | ICD-10-CM | POA: Diagnosis not present

## 2021-03-10 ENCOUNTER — Other Ambulatory Visit: Payer: Self-pay | Admitting: Family Medicine

## 2021-03-14 ENCOUNTER — Ambulatory Visit: Payer: Medicare Other | Admitting: Family Medicine

## 2021-03-21 ENCOUNTER — Ambulatory Visit (INDEPENDENT_AMBULATORY_CARE_PROVIDER_SITE_OTHER): Payer: Medicare Other | Admitting: Family Medicine

## 2021-03-21 ENCOUNTER — Encounter: Payer: Self-pay | Admitting: Family Medicine

## 2021-03-21 ENCOUNTER — Other Ambulatory Visit: Payer: Self-pay

## 2021-03-21 VITALS — BP 148/82 | HR 97 | Temp 98.6°F | Resp 16 | Ht 72.0 in | Wt 233.7 lb

## 2021-03-21 DIAGNOSIS — M170 Bilateral primary osteoarthritis of knee: Secondary | ICD-10-CM | POA: Diagnosis not present

## 2021-03-21 DIAGNOSIS — M1990 Unspecified osteoarthritis, unspecified site: Secondary | ICD-10-CM

## 2021-03-21 DIAGNOSIS — I10 Essential (primary) hypertension: Secondary | ICD-10-CM

## 2021-03-21 MED ORDER — SPIRONOLACTONE 25 MG PO TABS: 25.0000 mg | ORAL_TABLET | Freq: Every day | ORAL | 0 refills | Status: DC

## 2021-03-21 NOTE — Patient Instructions (Signed)
It was great to see you!  Our plans for today:  - Start the new medication, spironolactone for your blood pressure.  - Come back in 2-4 weeks for follow up. Bring your blood pressure log with you when you come.   We are checking some labs today, we will release these results to your MyChart.  Take care and seek immediate care sooner if you develop any concerns.   Dr. Ky Barban

## 2021-03-21 NOTE — Assessment & Plan Note (Signed)
Remains uncontrolled. Will start spironolactone given prior h/o angioedema with amlodipine and ACEI. Obtaining BMP today. F/u in 2-4 weeks.

## 2021-03-21 NOTE — Progress Notes (Addendum)
    SUBJECTIVE:   CHIEF COMPLAINT / HPI:   Hypertension: - Medications: clonidine, HCTZ, losartan - Compliance: good - recently seen by Cardiology, increased losartan to '100mg'$ . - Checking BP at home: sometimes, 120-150. Normally around 140s. - Denies any SOB, CP, LE edema, medication SEs, or symptoms of hypotension - dizziness has gotten better - Diet: 2 meals per day with evening snack.  - Exercise: walking more - compliant with CPAP - thinks pain from knee arthritis may be contributing   OBJECTIVE:   BP (!) 148/82   Pulse 97   Temp 98.6 F (37 C) (Oral)   Resp 16   Ht 6' (1.829 m)   Wt 233 lb 11.2 oz (106 kg)   SpO2 99%   BMI 31.70 kg/m   Gen: well appearing, in NAD Card: RRR, slight systolic murmur Lungs: CTAB Ext: WWP, no edema   ASSESSMENT/PLAN:   Hypertension Remains uncontrolled. Will start spironolactone given prior h/o angioedema with amlodipine and ACEI. Obtaining BMP today. F/u in 2-4 weeks.   Osteoarthritis of knees, bilateral Consider steroid injection if OTC remedies not managing pain well, has had in the past. Not interested in joint replacement.    Myles Gip, DO

## 2021-03-21 NOTE — Assessment & Plan Note (Signed)
Consider steroid injection if OTC remedies not managing pain well, has had in the past. Not interested in joint replacement.

## 2021-03-22 LAB — BASIC METABOLIC PANEL
BUN/Creatinine Ratio: 12 (calc) (ref 6–22)
BUN: 16 mg/dL (ref 7–25)
CO2: 28 mmol/L (ref 20–32)
Calcium: 9.9 mg/dL (ref 8.6–10.3)
Chloride: 102 mmol/L (ref 98–110)
Creat: 1.36 mg/dL — ABNORMAL HIGH (ref 0.70–1.28)
Glucose, Bld: 105 mg/dL — ABNORMAL HIGH (ref 65–99)
Potassium: 4.8 mmol/L (ref 3.5–5.3)
Sodium: 137 mmol/L (ref 135–146)

## 2021-03-22 NOTE — Progress Notes (Signed)
Patient notified

## 2021-04-16 ENCOUNTER — Other Ambulatory Visit: Payer: Self-pay | Admitting: Family Medicine

## 2021-05-02 ENCOUNTER — Other Ambulatory Visit: Payer: Self-pay

## 2021-05-02 ENCOUNTER — Encounter: Payer: Self-pay | Admitting: Family Medicine

## 2021-05-02 ENCOUNTER — Ambulatory Visit (INDEPENDENT_AMBULATORY_CARE_PROVIDER_SITE_OTHER): Payer: Medicare Other | Admitting: Family Medicine

## 2021-05-02 VITALS — BP 160/82 | HR 91 | Temp 98.3°F | Resp 16 | Ht 72.0 in | Wt 231.3 lb

## 2021-05-02 DIAGNOSIS — R3916 Straining to void: Secondary | ICD-10-CM | POA: Diagnosis not present

## 2021-05-02 DIAGNOSIS — I10 Essential (primary) hypertension: Secondary | ICD-10-CM

## 2021-05-02 DIAGNOSIS — R350 Frequency of micturition: Secondary | ICD-10-CM | POA: Insufficient documentation

## 2021-05-02 DIAGNOSIS — N1831 Chronic kidney disease, stage 3a: Secondary | ICD-10-CM | POA: Diagnosis not present

## 2021-05-02 LAB — POCT URINALYSIS DIPSTICK
Bilirubin, UA: NEGATIVE
Blood, UA: NEGATIVE
Glucose, UA: NEGATIVE
Ketones, UA: NEGATIVE
Leukocytes, UA: NEGATIVE
Nitrite, UA: NEGATIVE
Odor: NORMAL
Protein, UA: NEGATIVE
Spec Grav, UA: 1.02 (ref 1.010–1.025)
Urobilinogen, UA: 0.2 E.U./dL
pH, UA: 6 (ref 5.0–8.0)

## 2021-05-02 NOTE — Progress Notes (Signed)
   SUBJECTIVE:   CHIEF COMPLAINT / HPI:   Hypertension: - Medications: clonidine, HCTZ, losartan, spironolactone (started at last visit) - h/o angioedema with amlodipine and ACEI. - no FH of renal disease.  - Compliance: about 1 missed dose per week. - Checking BP at home: no - Denies any SOB, CP, vision changes, LE edema, medication SEs, or symptoms of hypotension - non smoker.   Urinary straining Reports increased urinary frequency and straining over the past year. Almost always has to return to the bathroom about 45 minutes after urinating to empty bladder. Often has to stop and start again. Denies weak stream. Often has to strain. H/o prostatectomy 2008 for prostate cancer with [facility]. No blood in urine. Able to empty bladder. Non smoker.    OBJECTIVE:   BP (!) 160/82   Pulse 91   Temp 98.3 F (36.8 C)   Resp 16   Ht 6' (1.829 m)   Wt 231 lb 4.8 oz (104.9 kg)   SpO2 99%   BMI 31.37 kg/m   Gen: well appearing, in NAD Card: RRR Lungs: CTAB Abd: nonTTP Ext: WWP, no edema  Bedside US of b/l kidneys without cortical irregularity, hydronephrosis. Bladder without obvious mass or thickened wall. Prostate surgically absent.   ASSESSMENT/PLAN:   Hypertension Remains uncontrolled despite 4 agents. Will obtain BMP to assess renal function and potassium after initiating spironolactone, consider dose increase if able once labs result. Also obtaining urine microalbumin today. Refer to nephrology given resistant hypertension.   Urinary frequency Unclear etiology. H/o prostatectomy 2008 2/2 prostate cancer. Bedside bladder US without obvious etiology. Will obtain urine studies, labs.    Myles Gip, DO

## 2021-05-02 NOTE — Assessment & Plan Note (Addendum)
Remains uncontrolled despite 4 agents. Will obtain BMP to assess renal function and potassium after initiating spironolactone, consider dose increase if able once labs result. Also obtaining urine microalbumin today. Refer to nephrology given resistant hypertension.

## 2021-05-02 NOTE — Patient Instructions (Addendum)
It was great to see you!  Our plans for today:  - We referred you to a kidney specialist, let us know if you don't hear about an appointment in the next few weeks.  - We are checking some labs today, we will call you with these results.  - Follow up in 3 months.   Take care and seek immediate care sooner if you develop any concerns.   Dr. Ky Barban

## 2021-05-02 NOTE — Assessment & Plan Note (Signed)
Unclear etiology. H/o prostatectomy 2008 2/2 prostate cancer. Bedside bladder US without obvious etiology. Will obtain urine studies, labs.

## 2021-05-03 LAB — BASIC METABOLIC PANEL
BUN/Creatinine Ratio: 11 (calc) (ref 6–22)
BUN: 14 mg/dL (ref 7–25)
CO2: 28 mmol/L (ref 20–32)
Calcium: 10.1 mg/dL (ref 8.6–10.3)
Chloride: 101 mmol/L (ref 98–110)
Creat: 1.29 mg/dL — ABNORMAL HIGH (ref 0.70–1.28)
Glucose, Bld: 107 mg/dL — ABNORMAL HIGH (ref 65–99)
Potassium: 4.3 mmol/L (ref 3.5–5.3)
Sodium: 137 mmol/L (ref 135–146)

## 2021-05-03 LAB — MICROALBUMIN / CREATININE URINE RATIO
Creatinine, Urine: 128 mg/dL (ref 20–320)
Microalb Creat Ratio: 3 mcg/mg creat (ref <30)
Microalb, Ur: 0.4 mg/dL

## 2021-05-30 ENCOUNTER — Other Ambulatory Visit: Payer: Self-pay | Admitting: Nephrology

## 2021-05-30 ENCOUNTER — Other Ambulatory Visit (HOSPITAL_COMMUNITY): Payer: Self-pay | Admitting: Nephrology

## 2021-05-30 DIAGNOSIS — I10 Essential (primary) hypertension: Secondary | ICD-10-CM | POA: Diagnosis not present

## 2021-05-30 DIAGNOSIS — N183 Chronic kidney disease, stage 3 unspecified: Secondary | ICD-10-CM | POA: Diagnosis not present

## 2021-05-30 DIAGNOSIS — N1831 Chronic kidney disease, stage 3a: Secondary | ICD-10-CM | POA: Diagnosis not present

## 2021-05-30 DIAGNOSIS — M170 Bilateral primary osteoarthritis of knee: Secondary | ICD-10-CM | POA: Diagnosis not present

## 2021-05-30 DIAGNOSIS — N2889 Other specified disorders of kidney and ureter: Secondary | ICD-10-CM

## 2021-05-30 DIAGNOSIS — E7800 Pure hypercholesterolemia, unspecified: Secondary | ICD-10-CM | POA: Insufficient documentation

## 2021-05-30 DIAGNOSIS — M1993 Secondary osteoarthritis, unspecified site: Secondary | ICD-10-CM | POA: Insufficient documentation

## 2021-06-06 ENCOUNTER — Ambulatory Visit
Admission: RE | Admit: 2021-06-06 | Discharge: 2021-06-06 | Disposition: A | Discharge status: Home / Self Care | Payer: Medicare Other | Source: Ambulatory Visit | Attending: Nephrology | Admitting: Nephrology

## 2021-06-06 ENCOUNTER — Other Ambulatory Visit: Payer: Self-pay

## 2021-06-06 DIAGNOSIS — N2889 Other specified disorders of kidney and ureter: Secondary | ICD-10-CM | POA: Insufficient documentation

## 2021-06-06 DIAGNOSIS — N1831 Chronic kidney disease, stage 3a: Secondary | ICD-10-CM | POA: Insufficient documentation

## 2021-06-06 DIAGNOSIS — N281 Cyst of kidney, acquired: Secondary | ICD-10-CM | POA: Diagnosis not present

## 2021-06-18 ENCOUNTER — Other Ambulatory Visit: Payer: Self-pay

## 2021-06-18 ENCOUNTER — Encounter: Payer: Self-pay | Admitting: Family Medicine

## 2021-06-18 ENCOUNTER — Ambulatory Visit (INDEPENDENT_AMBULATORY_CARE_PROVIDER_SITE_OTHER): Payer: Medicare Other | Admitting: Family Medicine

## 2021-06-18 VITALS — BP 128/70 | HR 85 | Temp 98.0°F | Resp 16 | Ht 72.0 in | Wt 224.6 lb

## 2021-06-18 DIAGNOSIS — E782 Mixed hyperlipidemia: Secondary | ICD-10-CM | POA: Diagnosis not present

## 2021-06-18 DIAGNOSIS — Z5181 Encounter for therapeutic drug level monitoring: Secondary | ICD-10-CM | POA: Diagnosis not present

## 2021-06-18 DIAGNOSIS — N1831 Chronic kidney disease, stage 3a: Secondary | ICD-10-CM | POA: Diagnosis not present

## 2021-06-18 DIAGNOSIS — I10 Essential (primary) hypertension: Secondary | ICD-10-CM

## 2021-06-18 DIAGNOSIS — I739 Peripheral vascular disease, unspecified: Secondary | ICD-10-CM

## 2021-06-18 DIAGNOSIS — I25118 Atherosclerotic heart disease of native coronary artery with other forms of angina pectoris: Secondary | ICD-10-CM

## 2021-06-18 DIAGNOSIS — Z23 Encounter for immunization: Secondary | ICD-10-CM

## 2021-06-18 NOTE — Patient Instructions (Signed)
Follow up with the vascular specialists -  Peripheral Vascular Disease Peripheral vascular disease (PVD) is a disease of the blood vessels. PVD may also be called peripheral artery disease (PAD) or poor circulation. PVD is the blocking or hardening of the arteries anywhere within the circulatory system beyond the heart. This can result in a decreased supply of blood to the arms, legs, and internal organs, such as the stomach or kidneys. However, PVD most often affects a person's lower legs and feet. Without treatment, PVD often worsens. PVD can lead to acute limb ischemia. This occurs when an arm or leg suddenly has trouble getting enough blood. This is a medical emergency. What are the causes? The most common cause of PVD is atherosclerosis. This is a buildup of fatty material and other substances (plaque)inside your arteries. Pieces of plaque can break off from the walls of an artery and become stuck in a smaller artery, blocking blood flow and possibly causing acute limb ischemia. Other common causes of PVD include: Blood clots that form inside the blood vessels. Injuries to blood vessels. Diseases that cause inflammation of blood vessels or cause blood vessel tightening (spasms). What increases the risk? The following factors may make you more likely to develop this condition: A family history of PVD. Common medical conditions, including: High cholesterol. Diabetes. High blood pressure (hypertension). Heart disease. Known atherosclerotic disease in another area of the body. Past injury, such as burns or a broken bone. Other medical conditions, such as: Buerger's disease. This is caused by inflamed blood vessels in your hands and feet. Some forms of arthritis. Birth defects that affect the arteries in your legs. Kidney disease. Using tobacco and nicotine products. Not getting enough exercise. Obesity. Being age 68 or older, or being age 61 or older and having the other risk  factors. What are the signs or symptoms? This condition may cause different symptoms. Your symptoms depend on what body part is not getting enough blood. Common signs and symptoms include: Cramps in your buttocks, legs, and feet. Intermittent claudication. This is pain and weakness in your legs during activity that resolves with rest. Leg pain at rest and leg numbness, tingling, or weakness. Coldness in a leg or foot, especially when compared to the other leg or foot. Skin or hair changes. These can include: Hair loss. Shiny skin. Pale or bluish skin. Thick toenails. Inability to get or maintain an erection (erectile dysfunction). Tiredness (fatigue). Weak pulse or no pulse in the feet. People with PVD are more likely to develop open wounds (ulcers) and sores on their toes, feet, or legs. The ulcers or sores may take longer than normal to heal. How is this diagnosed? PVD is diagnosed based on your signs and symptoms, a physical exam, and your medical history. You may also have other tests to find the cause. Tests include: Ankle-brachial index test.This test compares the blood pressure readings of the legs and arms. This may also include an exercise ankle-brachial index test in which you walk on a treadmill to check your symptoms. Doppler ultrasound. This takes pictures of blood flow through your blood vessels. Imaging studies that use dye to show blood flow. These are: CT angiogram. Magnetic resonance angiogram, or MRA. How is this treated? Treatment for PVD depends on the cause of your condition, how severe your symptoms are, and your age. Underlying causes need to be treated and controlled. These include long-term (chronic) conditions, such as diabetes, high cholesterol, and hypertension. Treatment may include: Lifestyle changes, such as:  Quitting tobacco use. Exercising regularly. Following a low-fat, low-cholesterol diet. Not drinking alcohol. Taking medicines, such as: Blood  thinners to prevent blood clots. Medicines to improve blood flow. Medicines to improve cholesterol levels. Procedures, such as: Angioplasty. This uses an inflated balloon to open a blocked artery and improve blood flow. Stent implant. This inserts a small mesh tube to keep a blocked artery open. Peripheral bypass surgery. This reroutes blood flow around a blocked artery. Surgery to remove dead tissue from an infected wound (debridement). Amputation. This is surgical removal of the affected limb. It may be necessary in cases of acute limb ischemia when medical or surgical treatments have not helped. Follow these instructions at home: Medicines Take over-the-counter and prescription medicines only as told by your health care provider. If you are taking blood thinners: Talk with your health care provider before you take any medicines that contain aspirin or NSAIDs, such as ibuprofen. These medicines increase your risk for dangerous bleeding. Take your medicine exactly as told, at the same time every day. Avoid activities that could cause injury or bruising, and follow instructions about how to prevent falls. Wear a medical alert bracelet or carry a card that lists what medicines you take. Lifestyle   Exercise regularly. Ask your health care provider about some good activities for you. Talk with your health care provider about maintaining a healthy weight. If needed, ask about losing weight. Eat a diet that is low in fat and cholesterol. If you need help, talk with your health care provider. Do not drink alcohol. Do not use any products that contain nicotine or tobacco. These products include cigarettes, chewing tobacco, and vaping devices, such as e-cigarettes. If you need help quitting, ask your health care provider. General instructions Take good care of your feet. To do this: Wear comfortable shoes that fit well. Check your feet often for any cuts or sores. Get an annual influenza  vaccine. Keep all follow-up visits. This is important. Where to find more information Society for Vascular Surgery: vascular.org American Heart Association: heart.org National Heart, Lung, and Blood Institute: https://www.hartman-hill.biz/ Contact a health care provider if: You have leg cramps while walking. You have leg pain when you rest. Your leg or foot feels cold. Your skin changes color. You have erectile dysfunction. You have cuts or sores on your legs or feet that do not heal. Get help right away if: You have sudden changes in color and feeling of your arms or legs, such as: Your arm or leg turns cold, numb, and blue. Your arm or leg becomes red, warm, swollen, painful, or numb. You have any symptoms of a stroke. "BE FAST" is an easy way to remember the main warning signs of a stroke: B - Balance. Signs are dizziness, sudden trouble walking, or loss of balance. E - Eyes. Signs are trouble seeing or a sudden change in vision. F - Face. Signs are sudden weakness or numbness of the face, or the face or eyelid drooping on one side. A - Arms. Signs are weakness or numbness in an arm. This happens suddenly and usually on one side of the body. S - Speech. Signs are sudden trouble speaking, slurred speech, or trouble understanding what people say. T - Time. Time to call emergency services. Write down what time symptoms started. You have other signs of a stroke, such as: A sudden, severe headache with no known cause. Nausea or vomiting. Seizure. You have chest pain or trouble breathing. These symptoms may represent a serious  problem that is an emergency. Do not wait to see if the symptoms will go away. Get medical help right away. Call your local emergency services (911 in the [place] Do not drive yourself to the hospital. Summary Peripheral vascular disease (PVD) is a disease of the blood vessels. PVD is the blocking or hardening of the arteries anywhere within the circulatory system beyond the  heart. PVD may cause different symptoms. Your symptoms depend on what part of your body is not getting enough blood. Treatment for PVD depends on what caused it, how severe your symptoms are, and your age. This information is not intended to replace advice given to you by your health care provider. Make sure you discuss any questions you have with your health care provider. Document Revised: 02/07/2020 Document Reviewed: 02/07/2020 Elsevier Patient Education  [place].

## 2021-06-18 NOTE — Progress Notes (Signed)
Name: Jared Cline   MRN: [id number]    DOB: 04-24-1946   Date:06/18/2021       Progress Note  Chief Complaint  Patient presents with   Follow-up   Hypertension     Subjective:   Jared Cline is a 75 y.o. male, presents to clinic for BP recheck    Better med compliance clonidine, spironolactone, losartan, HCTZ seeing nephrology - recent OV and labs reviewed BP Readings from Last 6 Encounters:  06/18/21 128/70  05/02/21 (!) 160/82  03/21/21 (!) 148/82  02/14/21 (!) 158/84  01/25/21 132/84  07/21/20 138/84  BP at goal, pt feeling well  CKD stage 3, also renal mass referred to urology and for MRI for further eval  HLD pravastatin  Lab Results  Component Value Date   CHOL 154 07/21/2020   HDL 53 07/21/2020   LDLCALC 86 07/21/2020   TRIG 66 07/21/2020   CHOLHDL 2.9 07/21/2020  Pt reports taking med and tolerating  Claudication sx are worse than last year July 2021 - previously consulted vascular specialists- lost to f/up    Current Outpatient Medications:    aspirin EC 81 MG tablet, Take 81 mg by mouth daily., Disp: , Rfl:    cloNIDine (CATAPRES) 0.3 MG tablet, TAKE 1 TABLET(0.3 MG) BY MOUTH TWICE DAILY, Disp: 180 tablet, Rfl: 1   hydrochlorothiazide (HYDRODIURIL) 25 MG tablet, TAKE 1 TABLET BY MOUTH EVERY DAY, Disp: 90 tablet, Rfl: 3   losartan (COZAAR) 100 MG tablet, Take 100 mg by mouth daily., Disp: , Rfl:    magnesium oxide (MAG-OX) 400 MG tablet, Take 400 mg by mouth daily., Disp: , Rfl:    meloxicam (MOBIC) 15 MG tablet, Take 15 mg by mouth daily., Disp: , Rfl:    Potassium Gluconate 550 MG TABS, Take 550 mg by mouth daily., Disp: , Rfl:    pravastatin (PRAVACHOL) 80 MG tablet, Take 80 mg by mouth at bedtime., Disp: , Rfl:    spironolactone (ALDACTONE) 25 MG tablet, Take 1 tablet (25 mg total) by mouth daily., Disp: 90 tablet, Rfl: 0   valsartan (DIOVAN) 80 MG tablet, valsartan 80 mg tablet, Disp: , Rfl:   Patient Active Problem List   Diagnosis Date Noted    Urinary frequency 05/02/2021   Dizziness 02/14/2021   Osteoarthritis of knees, bilateral 12/28/2020   Numbness and tingling of both lower extremities 04/19/2020   Swelling of both lower extremities 04/19/2020   Numbness of fingers of both hands 04/19/2020   PVD (peripheral vascular disease) ([place]) 03/24/2020   Leg pain, bilateral 03/24/2020   LVH (left ventricular hypertrophy) due to hypertensive disease, without heart failure 05/06/2018   First degree AV block 04/15/2018   Coronary artery disease involving native coronary artery of native heart 04/15/2018   Medication monitoring encounter 06/09/2017   Coronary artery calcification seen on CAT scan 03/20/2017   OSA on CPAP 11/22/2015   Atherosclerosis of native arteries of extremity with intermittent claudication ([place]) 10/25/2015   Right renal mass 10/19/2015   Hyperlipidemia    Hypertension    Chronic kidney disease, stage 3 (HCC)    Carpal tunnel syndrome, left    History of prostate cancer    Multiple lung nodules     Past Surgical History:  Procedure Laterality Date   abdominal ultrasound  Oct 2015   Negative for eval for AAA   COLONOSCOPY     KNEE ARTHROSCOPY Right    x 2   LUMBAR LAMINECTOMY     PROSTATECTOMY  robotic    Family History  Problem Relation Age of Onset   Stroke Mother    Hypertension Mother    Aneurysm Father        cerebral   Hypertension Father    Heart disease Paternal Uncle    Alcohol abuse Brother    Cancer Neg Hx    COPD Neg Hx    Diabetes Neg Hx     Social History   Tobacco Use   Smoking status: Former    Packs/day: 0.50    Years: 10.00    Pack years: 5.00    Types: Cigarettes    Quit date: 08/20/1971    Years since quitting: 49.8   Smokeless tobacco: Never  Vaping Use   Vaping Use: Never used  Substance Use Topics   Alcohol use: No    Alcohol/week: 0.0 standard drinks   Drug use: No     Allergies  Allergen Reactions   Flomax [Tamsulosin Hcl] Shortness Of Breath   Ace  Inhibitors Other (See Comments)    Angioedema   Amlodipine Other (See Comments)    Angioedema   Doxazosin    Lipitor [Atorvastatin] Other (See Comments)    Memory loss    Health Maintenance  Topic Date Due   Zoster Vaccines- Shingrix (1 of 2) Never done   COVID-19 Vaccine (4 - Booster for Pfizer series) 08/11/2020   INFLUENZA VACCINE  11/16/2021 (Originally 03/19/2021)   COLONOSCOPY (Pts 45-23yrs Insurance coverage will need to be confirmed)  06/18/2022 (Originally 12/24/2020)   TETANUS/TDAP  10/17/2023   Pneumonia Vaccine 55+ Years old  Completed   Hepatitis C Screening  Completed   HPV VACCINES  Aged Out    Chart Review Today: I personally reviewed active problem list, medication list, allergies, family history, social history, health maintenance, notes from last encounter, lab results, imaging with the patient/caregiver today.   Review of Systems  Constitutional: Negative.   HENT: Negative.    Eyes: Negative.   Respiratory: Negative.    Cardiovascular: Negative.   Gastrointestinal: Negative.   Endocrine: Negative.   Genitourinary: Negative.   Musculoskeletal: Negative.   Skin: Negative.   Allergic/Immunologic: Negative.   Neurological: Negative.   Hematological: Negative.   Psychiatric/Behavioral: Negative.    All other systems reviewed and are negative.   Objective:   Vitals:   06/18/21 1346  BP: 128/70  Pulse: 85  Resp: 16  Temp: 98 F (36.7 C)  TempSrc: Oral  SpO2: 98%  Weight: 224 lb 9.6 oz (101.9 kg)  Height: 6' (1.829 m)    Body mass index is 30.46 kg/m.  Physical Exam Vitals and nursing note reviewed.  Constitutional:      General: He is not in acute distress.    Appearance: Normal appearance. He is well-developed. He is not ill-appearing, toxic-appearing or diaphoretic.     Interventions: Face mask in place.  HENT:     Head: Normocephalic and atraumatic.     Jaw: No trismus.     Right Ear: External ear normal.     Left Ear: External ear  normal.  Eyes:     General: Lids are normal. No scleral icterus.       Right eye: No discharge.        Left eye: No discharge.     Conjunctiva/sclera: Conjunctivae normal.  Neck:     Trachea: Trachea and phonation normal. No tracheal deviation.  Cardiovascular:     Rate and Rhythm: Normal rate and regular rhythm.  Pulses: Normal pulses.          Radial pulses are 2+ on the right side and 2+ on the left side.     Heart sounds: Normal heart sounds. No murmur heard.   No friction rub. No gallop.  Pulmonary:     Effort: Pulmonary effort is normal. No respiratory distress.     Breath sounds: Normal breath sounds. No stridor. No wheezing, rhonchi or rales.  Abdominal:     General: Bowel sounds are normal. There is no distension.     Palpations: Abdomen is soft.  Musculoskeletal:     Right lower leg: No edema.     Left lower leg: No edema.  Skin:    General: Skin is warm and dry.     Coloration: Skin is not ashen, jaundiced or mottled.     Findings: No rash or wound.     Nails: There is no clubbing.  Neurological:     Mental Status: He is alert. Mental status is at baseline.     Cranial Nerves: No dysarthria or facial asymmetry.     Motor: No tremor or abnormal muscle tone.     Gait: Gait abnormal.  Psychiatric:        Mood and Affect: Mood normal.        Speech: Speech normal.        Behavior: Behavior normal. Behavior is cooperative.        Assessment & Plan:     ICD-10-CM   1. Primary hypertension  I10    BP better controlled, multiple MDs managing and refilling meds, nephrology records reviewed     2. Stage 3a chronic kidney disease (HCC)  N18.31    per nephrology    3. Mixed hyperlipidemia  E78.2    on statin, tolerating, last lipids reviewed, due around Dec    4. Encounter for medication monitoring  Z51.81     5. Need for influenza vaccination  Z23 Flu Vaccine QUAD High Dose(Fluad)    6. PAD (peripheral artery disease) (HCC)  I73.9 Ambulatory referral to  Vascular Surgery   lost to f/up with vascular specialists - having worse claudication sx    7. Coronary artery disease of native artery of native heart with stable angina pectoris ([place])  I25.118    sx stable, not on BB, but on statin     Labs recently done and reviewed through care everywhere  Return in about 6 months (around 12/16/2021) for [facility] [place] appt in November and push back Dec f/up appt .   Delsa Grana, PA-C 06/18/21 1:56 PM

## 2021-06-19 ENCOUNTER — Encounter: Payer: Self-pay | Admitting: Urology

## 2021-06-19 ENCOUNTER — Ambulatory Visit: Payer: Medicare Other | Admitting: Urology

## 2021-06-19 VITALS — BP 166/74 | HR 73 | Ht 72.0 in | Wt 225.0 lb

## 2021-06-19 DIAGNOSIS — N281 Cyst of kidney, acquired: Secondary | ICD-10-CM | POA: Diagnosis not present

## 2021-06-19 DIAGNOSIS — C61 Malignant neoplasm of prostate: Secondary | ICD-10-CM

## 2021-06-19 NOTE — Progress Notes (Signed)
06/19/21 3:49 PM   Jared Cline Jul 21, 1946 [id number]  CC: Renal mass, history of prostate cancer  HPI: I saw Jared Cline for the above issues.  He reportedly has a long history of small renal masses that had been followed by an outside urologist, but those records are not available to me.  He also has a history of prostate cancer treated with radical prostatectomy over 20 years ago, and most recent PSA in 2018 was undetectable.  He has some urinary frequency, but this is likely secondary to diuretic use.  Recent urinalysis was benign.   PMH: Past Medical History:  Diagnosis Date   BPH (benign prostatic hypertrophy)    Bradycardia    evaluated by Dr. Nehemiah Massed   Cancer [facility])    prostate   Carpal tunnel syndrome, left    left arm   CKD (chronic kidney disease) stage 2, GFR 60-89 ml/min    Diverticulitis    History of prostate cancer    HNP (herniated nucleus pulposus), lumbar    Hyperlipidemia    Hypertension    IBS (irritable bowel syndrome)    Impotence    Multiple lung nodules    nonspecific, largest 66mm in Jan 2016; scanned March 2017; rescan 3-6 months later   OA (osteoarthritis) of knee    right   Renal mass    evaluated; followed by urologist   Sleep apnea    on CPAP   Torn meniscus    x 2; right knee    Surgical History: Past Surgical History:  Procedure Laterality Date   abdominal ultrasound  Oct 2015   Negative for eval for AAA   COLONOSCOPY     KNEE ARTHROSCOPY Right    x 2   LUMBAR LAMINECTOMY     PROSTATECTOMY     robotic      Family History: Family History  Problem Relation Age of Onset   Stroke Mother    Hypertension Mother    Aneurysm Father        cerebral   Hypertension Father    Heart disease Paternal Uncle    Alcohol abuse Brother    Cancer Neg Hx    COPD Neg Hx    Diabetes Neg Hx     Social History:  reports that he quit smoking about 49 years ago. His smoking use included cigarettes. He has a 5.00 pack-year smoking history. He  has never used smokeless tobacco. He reports that he does not drink alcohol and does not use drugs.  Physical Exam: BP (!) 166/74   Pulse 73   Ht 6' (1.829 m)   Wt 225 lb (102.1 kg)   BMI 30.52 kg/m    Constitutional:  Alert and oriented, No acute distress. Cardiovascular: No clubbing, cyanosis, or edema. Respiratory: Normal respiratory effort, no increased work of breathing. GI: Abdomen is soft, nontender, nondistended, no abdominal masses  Laboratory Data: Reviewed, see HPI  Pertinent Imaging: I have personally viewed and interpreted the renal ultrasound dated 06/06/2021 showing no hydronephrosis, small left minimally complex cyst.  Assessment & Plan:   75 year old male with long history of small indeterminate renal masses previously evaluated with CT and ultrasound, these are most likely benign in nature, but I recommended an MRI for further evaluation to definitively characterize these lesions.  He has a history of radical prostatectomy for prostate cancer, and PSA has been undetectable for greater than 10 years and no further surveillance is needed.  Behavioral strategies discussed regarding urinary symptoms including timing  of diuretic use and bladder irritants  Will call with MRI results   Jared Madrid, MD 06/19/2021  [facility] [address] [place],  [address] [phone]

## 2021-06-25 ENCOUNTER — Other Ambulatory Visit: Payer: Self-pay | Admitting: Family Medicine

## 2021-06-25 NOTE — Telephone Encounter (Signed)
Requested Prescriptions  Pending Prescriptions Disp Refills  . spironolactone (ALDACTONE) 25 MG tablet [Pharmacy Med Name: SPIRONOLACTONE 25MG  TABLETS] 90 tablet 0    Sig: TAKE 1 TABLET(25 MG) BY MOUTH DAILY     Cardiovascular: Diuretics - Aldosterone Antagonist Failed - 06/25/2021  1:19 PM      Failed - Cr in normal range and within 360 days    Creat  Date Value Ref Range Status  05/02/2021 1.29 (H) 0.70 - 1.28 mg/dL Final   Creatinine, Urine  Date Value Ref Range Status  05/02/2021 128 20 - 320 mg/dL Final         Failed - Last BP in normal range    BP Readings from Last 1 Encounters:  06/19/21 (!) 166/74         Passed - K in normal range and within 360 days    Potassium  Date Value Ref Range Status  05/02/2021 4.3 3.5 - 5.3 mmol/L Final  08/25/2014 4.1 3.5 - 5.1 mmol/L Final         Passed - Na in normal range and within 360 days    Sodium  Date Value Ref Range Status  05/02/2021 137 135 - 146 mmol/L Final  10/19/2015 135 134 - 144 mmol/L Final  08/25/2014 133 (L) 136 - 145 mmol/L Final         Passed - Valid encounter within last 6 months    Recent Outpatient Visits          1 week ago Primary hypertension   [facility] Delsa Grana, PA-C   1 month ago Primary hypertension   [place], [place], DO   3 months ago Primary hypertension   [place], DO   4 months ago [facility], DO   5 months ago Screening for colon cancer   [place], NP      Future Appointments            In 1 week  [facility], [place]   In 5 months Delsa Grana, PA-C [facility], [facility]

## 2021-07-04 DIAGNOSIS — I10 Essential (primary) hypertension: Secondary | ICD-10-CM | POA: Diagnosis not present

## 2021-07-04 DIAGNOSIS — N1831 Chronic kidney disease, stage 3a: Secondary | ICD-10-CM | POA: Diagnosis not present

## 2021-07-04 DIAGNOSIS — Z9079 Acquired absence of other genital organ(s): Secondary | ICD-10-CM | POA: Insufficient documentation

## 2021-07-04 DIAGNOSIS — N281 Cyst of kidney, acquired: Secondary | ICD-10-CM | POA: Insufficient documentation

## 2021-07-04 DIAGNOSIS — N2889 Other specified disorders of kidney and ureter: Secondary | ICD-10-CM | POA: Diagnosis not present

## 2021-07-04 DIAGNOSIS — M1993 Secondary osteoarthritis, unspecified site: Secondary | ICD-10-CM | POA: Diagnosis not present

## 2021-07-05 ENCOUNTER — Other Ambulatory Visit: Payer: Self-pay

## 2021-07-05 ENCOUNTER — Ambulatory Visit (INDEPENDENT_AMBULATORY_CARE_PROVIDER_SITE_OTHER): Payer: Medicare Other

## 2021-07-05 VITALS — BP 138/70 | HR 87 | Temp 99.6°F | Resp 16 | Ht 72.0 in | Wt 231.7 lb

## 2021-07-05 DIAGNOSIS — Z1211 Encounter for screening for malignant neoplasm of colon: Secondary | ICD-10-CM | POA: Diagnosis not present

## 2021-07-05 DIAGNOSIS — Z0000 Encounter for general adult medical examination without abnormal findings: Secondary | ICD-10-CM

## 2021-07-05 NOTE — Patient Instructions (Signed)
Mr. Gomer , Thank you for taking time to come for your Medicare Wellness Visit. I appreciate your ongoing commitment to your health goals. Please review the following plan we discussed and let me know if I can assist you in the future.   Screening recommendations/referrals: Colonoscopy: done 12/25/15. Referral sent today to [facility] for repeat screening colonoscopy Recommended yearly ophthalmology/optometry visit for glaucoma screening and checkup Recommended yearly dental visit for hygiene and checkup  Vaccinations: Influenza vaccine: done 06/18/21 Pneumococcal vaccine: done 06/09/17 Tdap vaccine: due Shingles vaccine: Shingrix discussed. Please contact your pharmacy for coverage information.  Covid-19: done 10/23/19, 11/13/19 & 06/16/20  Advanced directives: Please bring a copy of your health care power of attorney and living will to the office at your convenience once you have completed those documents.   Conditions/risks identified: Recommend increasing physical activity as tolerated  Next appointment: Follow up in one year for your annual wellness visit.   Preventive Care 76 Years and Older, Male Preventive care refers to lifestyle choices and visits with your health care provider that can promote health and wellness. What does preventive care include? A yearly physical exam. This is also called an annual well check. Dental exams once or twice a year. Routine eye exams. Ask your health care provider how often you should have your eyes checked. Personal lifestyle choices, including: Daily care of your teeth and gums. Regular physical activity. Eating a healthy diet. Avoiding tobacco and drug use. Limiting alcohol use. Practicing safe sex. Taking low doses of aspirin every day. Taking vitamin and mineral supplements as recommended by your health care provider. What happens during an annual well check? The services and screenings done by your health care provider  during your annual well check will depend on your age, overall health, lifestyle risk factors, and family history of disease. Counseling  Your health care provider may ask you questions about your: Alcohol use. Tobacco use. Drug use. Emotional well-being. Home and relationship well-being. Sexual activity. Eating habits. History of falls. Memory and ability to understand (cognition). Work and work Statistician. Screening  You may have the following tests or measurements: Height, weight, and BMI. Blood pressure. Lipid and cholesterol levels. These may be checked every 5 years, or more frequently if you are over 76 years old. Skin check. Lung cancer screening. You may have this screening every year starting at age 76 if you have a 30-pack-year history of smoking and currently smoke or have quit within the past 15 years. Fecal occult blood test (FOBT) of the stool. You may have this test every year starting at age 76. Flexible sigmoidoscopy or colonoscopy. You may have a sigmoidoscopy every 5 years or a colonoscopy every 10 years starting at age 76. Prostate cancer screening. Recommendations will vary depending on your family history and other risks. Hepatitis C blood test. Hepatitis B blood test. Sexually transmitted disease (STD) testing. Diabetes screening. This is done by checking your blood sugar (glucose) after you have not eaten for a while (fasting). You may have this done every 1-3 years. Abdominal aortic aneurysm (AAA) screening. You may need this if you are a current or former smoker. Osteoporosis. You may be screened starting at age 76 if you are at high risk. Talk with your health care provider about your test results, treatment options, and if necessary, the need for more tests. Vaccines  Your health care provider may recommend certain vaccines, such as: Influenza vaccine. This is recommended every year. Tetanus, diphtheria, and acellular pertussis (Tdap,  Td) vaccine. You  may need a Td booster every 10 years. Zoster vaccine. You may need this after age 76. Pneumococcal 13-valent conjugate (PCV13) vaccine. One dose is recommended after age 76. Pneumococcal polysaccharide (PPSV23) vaccine. One dose is recommended after age 76. Talk to your health care provider about which screenings and vaccines you need and how often you need them. This information is not intended to replace advice given to you by your health care provider. Make sure you discuss any questions you have with your health care provider. Document Released: 09/01/2015 Document Revised: 04/24/2016 Document Reviewed: 06/06/2015 Elsevier Interactive Patient Education  2017 [employer] Prevention in the Home Falls can cause injuries. They can happen to people of all ages. There are many things you can do to make your home safe and to help prevent falls. What can I do on the outside of my home? Regularly fix the edges of walkways and driveways and fix any cracks. Remove anything that might make you trip as you walk through a door, such as a raised step or threshold. Trim any bushes or trees on the path to your home. Use bright outdoor lighting. Clear any walking paths of anything that might make someone trip, such as rocks or tools. Regularly check to see if handrails are loose or broken. Make sure that both sides of any steps have handrails. Any raised decks and porches should have guardrails on the edges. Have any leaves, snow, or ice cleared regularly. Use sand or salt on walking paths during winter. Clean up any spills in your garage right away. This includes oil or grease spills. What can I do in the bathroom? Use night lights. Install grab bars by the toilet and in the tub and shower. Do not use towel bars as grab bars. Use non-skid mats or decals in the tub or shower. If you need to sit down in the shower, use a plastic, non-slip stool. Keep the floor dry. Clean up any water that spills  on the floor as soon as it happens. Remove soap buildup in the tub or shower regularly. Attach bath mats securely with double-sided non-slip rug tape. Do not have throw rugs and other things on the floor that can make you trip. What can I do in the bedroom? Use night lights. Make sure that you have a light by your bed that is easy to reach. Do not use any sheets or blankets that are too big for your bed. They should not hang down onto the floor. Have a firm chair that has side arms. You can use this for support while you get dressed. Do not have throw rugs and other things on the floor that can make you trip. What can I do in the kitchen? Clean up any spills right away. Avoid walking on wet floors. Keep items that you use a lot in easy-to-reach places. If you need to reach something above you, use a strong step stool that has a grab bar. Keep electrical cords out of the way. Do not use floor polish or wax that makes floors slippery. If you must use wax, use non-skid floor wax. Do not have throw rugs and other things on the floor that can make you trip. What can I do with my stairs? Do not leave any items on the stairs. Make sure that there are handrails on both sides of the stairs and use them. Fix handrails that are broken or loose. Make sure that handrails are as  long as the stairways. Check any carpeting to make sure that it is firmly attached to the stairs. Fix any carpet that is loose or worn. Avoid having throw rugs at the top or bottom of the stairs. If you do have throw rugs, attach them to the floor with carpet tape. Make sure that you have a light switch at the top of the stairs and the bottom of the stairs. If you do not have them, ask someone to add them for you. What else can I do to help prevent falls? Wear shoes that: Do not have high heels. Have rubber bottoms. Are comfortable and fit you well. Are closed at the toe. Do not wear sandals. If you use a stepladder: Make  sure that it is fully opened. Do not climb a closed stepladder. Make sure that both sides of the stepladder are locked into place. Ask someone to hold it for you, if possible. Clearly mark and make sure that you can see: Any grab bars or handrails. First and last steps. Where the edge of each step is. Use tools that help you move around (mobility aids) if they are needed. These include: Canes. Walkers. Scooters. Crutches. Turn on the lights when you go into a dark area. Replace any light bulbs as soon as they burn out. Set up your furniture so you have a clear path. Avoid moving your furniture around. If any of your floors are uneven, fix them. If there are any pets around you, be aware of where they are. Review your medicines with your doctor. Some medicines can make you feel dizzy. This can increase your chance of falling. Ask your doctor what other things that you can do to help prevent falls. This information is not intended to replace advice given to you by your health care provider. Make sure you discuss any questions you have with your health care provider. Document Released: 06/01/2009 Document Revised: 01/11/2016 Document Reviewed: 09/09/2014 Elsevier Interactive Patient Education  2017 [employer].

## 2021-07-05 NOTE — Progress Notes (Signed)
Subjective:   Jared Cline is a 75 y.o. male who presents for Medicare Annual/Subsequent preventive examination.  Review of Systems     Cardiac Risk Factors include: advanced age (>3men, >62 women);male gender;hypertension;dyslipidemia;obesity (BMI >30kg/m2)     Objective:    Today's Vitals   07/05/21 1326 07/05/21 1328  BP: 138/70   Pulse: 87   Resp: 16   Temp: 99.6 F (37.6 C)   TempSrc: Oral   SpO2: 99%   Weight: 231 lb 11.2 oz (105.1 kg)   Height: 6' (1.829 m)   PainSc:  5    Body mass index is 31.42 kg/m.  Advanced Directives 07/05/2021 07/04/2020 07/01/2019 06/09/2017 11/04/2016 03/05/2016  Does Patient Have a Medical Advance Directive? Yes Yes No No No Yes  Type of Paramedic of [place];Living will [place];Living will - - - Living will  Copy of Industry in Chart? No - copy requested No - copy requested - - - No - copy requested  Would patient like information on creating a medical advance directive? - - No - Patient declined - - -    Current Medications (verified) Outpatient Encounter Medications as of 07/05/2021  Medication Sig   aspirin EC 81 MG tablet Take 81 mg by mouth daily.   cloNIDine (CATAPRES) 0.3 MG tablet TAKE 1 TABLET(0.3 MG) BY MOUTH TWICE DAILY   hydrochlorothiazide (HYDRODIURIL) 25 MG tablet TAKE 1 TABLET BY MOUTH EVERY DAY   losartan (COZAAR) 100 MG tablet Take 100 mg by mouth daily.   magnesium oxide (MAG-OX) 400 MG tablet Take 400 mg by mouth daily.   meloxicam (MOBIC) 15 MG tablet Take 15 mg by mouth daily.   Potassium Gluconate 550 MG TABS Take 550 mg by mouth daily.   pravastatin (PRAVACHOL) 80 MG tablet Take 80 mg by mouth at bedtime.   spironolactone (ALDACTONE) 25 MG tablet TAKE 1 TABLET(25 MG) BY MOUTH DAILY   No facility-administered encounter medications on file as of 07/05/2021.    Allergies (verified) Flomax [tamsulosin hcl], Ace inhibitors, Amlodipine, Doxazosin,  and Lipitor [atorvastatin]   History: Past Medical History:  Diagnosis Date   BPH (benign prostatic hypertrophy)    Bradycardia    evaluated by Dr. Nehemiah Massed   Cancer [facility])    prostate   Carpal tunnel syndrome, left    left arm   CKD (chronic kidney disease) stage 2, GFR 60-89 ml/min    Diverticulitis    History of prostate cancer    HNP (herniated nucleus pulposus), lumbar    Hyperlipidemia    Hypertension    IBS (irritable bowel syndrome)    Impotence    Multiple lung nodules    nonspecific, largest 9mm in Jan 2016; scanned March 2017; rescan 3-6 months later   OA (osteoarthritis) of knee    right   Renal mass    evaluated; followed by urologist   Sleep apnea    on CPAP   Torn meniscus    x 2; right knee   Past Surgical History:  Procedure Laterality Date   abdominal ultrasound  Oct 2015   Negative for eval for AAA   COLONOSCOPY     KNEE ARTHROSCOPY Right    x 2   LUMBAR LAMINECTOMY     PROSTATECTOMY     robotic   Family History  Problem Relation Age of Onset   Stroke Mother    Hypertension Mother    Aneurysm Father  cerebral   Hypertension Father    Heart disease Paternal Uncle    Alcohol abuse Brother    Cancer Neg Hx    COPD Neg Hx    Diabetes Neg Hx    Social History   Socioeconomic History   Marital status: Married    Spouse name: Not on file   Number of children: 3   Years of education: Not on file   Highest education level: Not on file  Occupational History   Occupation: retired [employer] [place] Use   Smoking status: Former    Packs/day: 0.50    Years: 10.00    Pack years: 5.00    Types: Cigarettes    Quit date: 08/20/1971    Years since quitting: 49.9   Smokeless tobacco: Never  Vaping Use   Vaping Use: Never used  Substance and Sexual Activity   Alcohol use: No    Alcohol/week: 0.0 standard drinks   Drug use: No   Sexual activity: Not Currently  Other Topics Concern   Not on file  Social History Narrative    Retired from [employer] [place] 2012.   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    Social Determinants of Health   Financial Resource Strain: Low Risk    Difficulty of Paying Living Expenses: Not hard at all  Food Insecurity: No Food Insecurity   Worried About Charity fundraiser in the Last Year: Never true   [place] in the Last Year: Never true  Transportation Needs: No Transportation Needs   Lack of Transportation (Medical): No   Lack of Transportation (Non-Medical): No  Physical Activity: Inactive   Days of Exercise per Week: 0 days   Minutes of Exercise per Session: 0 min  Stress: No Stress Concern Present   Feeling of Stress : Not at all  Social Connections: Moderately Integrated   Frequency of Communication with Friends and Family: More than three times a week   Frequency of Social Gatherings with Friends and Family: Three times a week   Attends Religious Services: More than 4 times per year   Active Member of Clubs or Organizations: No   Attends Archivist Meetings: Never   Marital Status: Married    Tobacco Counseling Counseling given: Not Answered   Clinical Intake:  Pre-visit preparation completed: Yes  Pain : 0-10 Pain Score: 5  Pain Type: Chronic pain Pain Location: Leg Pain Orientation: Right, Left Pain Descriptors / Indicators: Aching, Tightness Pain Onset: More than a month ago Pain Frequency: Constant     BMI - recorded: 31.42 Nutritional Status: BMI > 30  Obese Nutritional Risks: None Diabetes: No  How often do you need to have someone help you when you read instructions, pamphlets, or other written materials from your doctor or pharmacy?: 1 - Never    Interpreter Needed?: No  Information entered by :: Clemetine Marker LPN   Activities of Daily Living In your present state of health, do you have any difficulty performing the following activities: 07/05/2021 06/18/2021  Hearing? N N  Vision? N N  Difficulty  concentrating or making decisions? N N  Walking or climbing stairs? Y Y  Dressing or bathing? N N  Doing errands, shopping? N N  Preparing Food and eating ? N -  Using the Toilet? N -  In the past six months, have you accidently leaked urine? N -  Do you have problems with loss of bowel control?  N -  Managing your Medications? N -  Managing your Finances? N -  Housekeeping or managing your Housekeeping? N -  Some recent data might be hidden    Patient Care Team: Delsa Grana, PA-C as PCP - General (Family Medicine) Corey Skains, MD as Consulting Physician (Cardiology) Lyla Son, MD as Consulting Physician (Nephrology) Billey Co, MD as Consulting Physician (Urology)  Indicate any recent Medical Services you may have received from other than [facility] providers in the past year (date may be approximate).     Assessment:   This is a routine wellness examination for [place].  Hearing/Vision screen Hearing Screening - Comments:: Pt denies hearing difficulty Vision Screening - Comments:: [facility] screen[facility]; plans to establish care with [facility]   Dietary issues and exercise activities discussed: Current Exercise Habits: The patient does not participate in regular exercise at present, Exercise limited by: orthopedic condition(s)   Goals Addressed   None    Depression Screen PHQ 2/9 Scores 07/05/2021 06/18/2021 05/02/2021 03/21/2021 02/14/2021 01/25/2021 07/21/2020  PHQ - 2 Score 0 0 0 0 0 0 0  PHQ- 9 Score 0 0 0 - - 0 -    Fall Risk Fall Risk  07/05/2021 06/18/2021 05/02/2021 03/21/2021 02/14/2021  Falls in the past year? 1 1 1  0 0  Number falls in past yr: 0 0 0 0 0  Injury with Fall? 0 0 0 0 0  Risk for fall due to : History of fall(s) Impaired balance/gait - - -  Follow up Falls prevention discussed Falls prevention discussed - Falls evaluation completed Falls evaluation completed    FALL RISK PREVENTION PERTAINING TO THE  HOME:  Any stairs in or around the home? Yes  If so, are there any without handrails? No  Home free of loose throw rugs in walkways, pet beds, electrical cords, etc? Yes  Adequate lighting in your home to reduce risk of falls? Yes   ASSISTIVE DEVICES UTILIZED TO PREVENT FALLS:  Life alert? No  Use of a cane, walker or w/c? No  Grab bars in the bathroom? No  Shower chair or bench in shower? Yes  Elevated toilet seat or a handicapped toilet? No   TIMED UP AND GO:  Was the test performed? Yes .  Length of time to ambulate 10 feet: 5 sec.   Gait steady and fast without use of assistive device  Cognitive Function:     6CIT Screen 07/04/2020  What Year? 0 points  What month? 0 points  What time? 0 points  Count back from 20 0 points  Months in reverse 2 points  Repeat phrase 0 points  Total Score 2    Immunizations Immunization History  Administered Date(s) Administered   Fluad Quad(high Dose 65+) 05/25/2019, 07/04/2020, 06/18/2021   Influenza, High Dose Seasonal PF 06/09/2017, 06/11/2018   PFIZER(Purple Top)SARS-COV-2 Vaccination 10/23/2019, 11/13/2019, 06/16/2020   Pneumococcal Conjugate-13 06/09/2017   Pneumococcal Polysaccharide-23 05/21/2011   Td 08/19/2006    TDAP status: Due, Education has been provided regarding the importance of this vaccine. Advised may receive this vaccine at local pharmacy or Health Dept. Aware to provide a copy of the vaccination record if obtained from local pharmacy or Health Dept. Verbalized acceptance and understanding.  Flu Vaccine status: Up to date  Pneumococcal vaccine status: Up to date  Covid-19 vaccine status: Completed vaccines  Qualifies for Shingles Vaccine? Yes   Zostavax completed No   Shingrix Completed?: No.  Education has been provided regarding the importance of this vaccine. Patient has been advised to call insurance company to determine out of pocket expense if they have not yet received this vaccine. Advised may  also receive vaccine at local pharmacy or Health Dept. Verbalized acceptance and understanding.  Screening Tests Health Maintenance  Topic Date Due   Zoster Vaccines- Shingrix (1 of 2) Never done   COVID-19 Vaccine (4 - Booster for Pfizer series) 08/11/2020   COLONOSCOPY (Pts 45-63yrs Insurance coverage will need to be confirmed)  06/18/2022 (Originally 12/24/2020)   TETANUS/TDAP  10/17/2023   Pneumonia Vaccine 8+ Years old  Completed   INFLUENZA VACCINE  Completed   Hepatitis C Screening  Completed   HPV VACCINES  Aged Out    Health Maintenance  Health Maintenance Due  Topic Date Due   Zoster Vaccines- Shingrix (1 of 2) Never done   COVID-19 Vaccine (4 - Booster for Pfizer series) 08/11/2020    Colorectal cancer screening: Type of screening: Colonoscopy. Completed 12/25/15. Repeat every 5 years  Lung Cancer Screening: (Low Dose CT Chest recommended if Age 63-80 years, 30 pack-year currently smoking OR have quit w/in 15years.) does not qualify.   Additional Screening:  Hepatitis C Screening: does qualify; Completed 07/21/20  Vision Screening: Recommended annual ophthalmology exams for early detection of glaucoma and other disorders of the eye. Is the patient up to date with their annual eye exam?  No  Who is the provider or what is the name of the office in which the patient attends annual eye exams? Plans to [facility] care with [facility].   Dental Screening: Recommended annual dental exams for proper oral hygiene  Community Resource Referral / Chronic Care Management: CRR required this visit?  No   CCM required this visit?  No      Plan:     I have personally reviewed and noted the following in the patient's chart:   Medical and social history Use of alcohol, tobacco or illicit drugs  Current medications and supplements including opioid prescriptions. Patient is not currently taking opioid prescriptions. Functional ability and status Nutritional  status Physical activity Advanced directives List of other physicians Hospitalizations, surgeries, and ER visits in previous 12 months Vitals Screenings to include cognitive, depression, and falls Referrals and appointments  In addition, I have reviewed and discussed with patient certain preventive protocols, quality metrics, and best practice recommendations. A written personalized care plan for preventive services as well as general preventive health recommendations were provided to patient.     Clemetine Marker, LPN   [id number]/03/[id number]   Nurse Notes: none

## 2021-07-09 ENCOUNTER — Ambulatory Visit
Admission: RE | Admit: 2021-07-09 | Discharge: 2021-07-09 | Disposition: A | Discharge status: Home / Self Care | Payer: Medicare Other | Source: Ambulatory Visit | Attending: Urology | Admitting: Urology

## 2021-07-09 DIAGNOSIS — N2889 Other specified disorders of kidney and ureter: Secondary | ICD-10-CM | POA: Diagnosis not present

## 2021-07-09 DIAGNOSIS — N281 Cyst of kidney, acquired: Secondary | ICD-10-CM | POA: Diagnosis not present

## 2021-07-09 DIAGNOSIS — I701 Atherosclerosis of renal artery: Secondary | ICD-10-CM | POA: Diagnosis not present

## 2021-07-09 DIAGNOSIS — N189 Chronic kidney disease, unspecified: Secondary | ICD-10-CM | POA: Diagnosis not present

## 2021-07-09 DIAGNOSIS — N3289 Other specified disorders of bladder: Secondary | ICD-10-CM | POA: Diagnosis not present

## 2021-07-09 MED ORDER — GADOBUTROL 1 MMOL/ML IV SOLN
10.0000 mL | Freq: Once | INTRAVENOUS | Status: AC | PRN
  Administered 2021-07-09: 10 mL via INTRAVENOUS

## 2021-07-10 ENCOUNTER — Telehealth: Payer: Self-pay

## 2021-07-10 NOTE — Telephone Encounter (Signed)
-----   Message from Billey Co, MD sent at 07/10/2021  8:04 AM EST ----- Good news, small renal mass is stable on MRI from 2017, no intervention needed recommended ongoing surveillance. RTC 1 year, will determine imaging at that follow up  Nickolas Madrid, MD 07/10/2021

## 2021-07-10 NOTE — Telephone Encounter (Signed)
Called pt and wife informed them of the information below. Both gave verbal understanding. Pt questions if anything abnormal was seen on MRI in/on his bladder. He c/o pain just below the belt, upon further questioning pt is getting up 3 times at night to void, when getting up at night he has difficulty getting urinary stream started, has to wait several minutes, he is also having to strain and push to get urine out. Stream is strong. Please advise.

## 2021-07-10 NOTE — Telephone Encounter (Signed)
The MRI just looked at the kidneys and upper abdomen, not the pelvis.  He had his prostate removed over 20 years ago for prostate cancer, and takes diuretics so that likely is the cause of his urinary frequency.  If symptoms worsening, could consider cystoscopy in clinic to rule out any scar tissue but that would be unlikely this far out from his surgery   Jared Madrid, MD  07/10/2021   Called pt informed him of the information above, pt voiced understanding and would like to proceed with Cysto. Appt scheduled.

## 2021-07-17 ENCOUNTER — Encounter: Payer: Self-pay | Admitting: Family Medicine

## 2021-07-17 DIAGNOSIS — I25118 Atherosclerotic heart disease of native coronary artery with other forms of angina pectoris: Secondary | ICD-10-CM | POA: Insufficient documentation

## 2021-07-25 ENCOUNTER — Other Ambulatory Visit: Payer: Self-pay

## 2021-07-25 ENCOUNTER — Ambulatory Visit: Payer: Medicare Other | Admitting: Urology

## 2021-07-25 ENCOUNTER — Encounter: Payer: Self-pay | Admitting: Urology

## 2021-07-25 VITALS — BP 155/75 | HR 81 | Ht 72.0 in | Wt 225.0 lb

## 2021-07-25 DIAGNOSIS — N281 Cyst of kidney, acquired: Secondary | ICD-10-CM

## 2021-07-25 DIAGNOSIS — R3911 Hesitancy of micturition: Secondary | ICD-10-CM

## 2021-07-25 LAB — URINALYSIS, COMPLETE
Bilirubin, UA: NEGATIVE
Glucose, UA: NEGATIVE
Ketones, UA: NEGATIVE
Leukocytes,UA: NEGATIVE
Nitrite, UA: NEGATIVE
Protein,UA: NEGATIVE
RBC, UA: NEGATIVE
Specific Gravity, UA: 1.02 (ref 1.005–1.030)
Urobilinogen, Ur: 0.2 mg/dL (ref 0.2–1.0)
pH, UA: 6.5 (ref 5.0–7.5)

## 2021-07-25 LAB — MICROSCOPIC EXAMINATION
Bacteria, UA: NONE SEEN
RBC, Urine: NONE SEEN /hpf (ref 0–2)

## 2021-07-25 MED ORDER — LIDOCAINE HCL URETHRAL/MUCOSAL 2 % EX GEL
1.0000 | Freq: Once | CUTANEOUS | Status: AC
Start: 2021-07-25 — End: 2021-07-25
  Administered 2021-07-25: 1 via URETHRAL

## 2021-07-25 NOTE — Progress Notes (Signed)
Cystoscopy Procedure Note:  Indication: Urinary urgency/frequency, history of open prostatectomy  After informed consent and discussion of the procedure and its risks, Fox Salminen was positioned and prepped in the standard fashion. Cystoscopy was performed with a flexible cystoscope. The urethra, bladder neck and entire bladder was visualized in a standard fashion. The prostate was surgically absent, no evidence of bladder neck contracture or stricture. The ureteral orifices were visualized in their normal location and orientation.  Bladder mucosa grossly normal throughout, no abnormalities on retroflexion   Findings: Normal cystoscopy, prostate surgically absent, no evidence of bladder neck contracture or stricture  Assessment and Plan: 75 year old male with mild to moderate urgency/frequency, no incontinence, as well as history of open radical prostatectomy over 20 years ago with undetectable PSA who opted for cystoscopy to evaluate for bladder neck contracture/stricture.  Cystoscopy was normal today.  I recommended starting with behavioral strategies including avoiding tea, lemonade, diet drinks, and sodas to see if his urinary symptoms improve, and at follow-up in 3 months we will check PVR and consider OAB medication if persistent symptoms.  He also has a stable subtly enhancing 1.4 cm right renal mass that has been stable since 2017, and I recommended continuing surveillance every 1 to 2 years.  Would be very unlikely that he would ever require any intervention for this based on the stability over the last 5 years.  Nickolas Madrid, MD 07/25/2021

## 2021-07-27 ENCOUNTER — Ambulatory Visit: Payer: Medicare Other | Admitting: Family Medicine

## 2021-08-30 DIAGNOSIS — I119 Hypertensive heart disease without heart failure: Secondary | ICD-10-CM | POA: Diagnosis not present

## 2021-08-30 DIAGNOSIS — I10 Essential (primary) hypertension: Secondary | ICD-10-CM | POA: Diagnosis not present

## 2021-08-30 DIAGNOSIS — I2510 Atherosclerotic heart disease of native coronary artery without angina pectoris: Secondary | ICD-10-CM | POA: Diagnosis not present

## 2021-08-30 DIAGNOSIS — I440 Atrioventricular block, first degree: Secondary | ICD-10-CM | POA: Diagnosis not present

## 2021-09-12 DIAGNOSIS — M170 Bilateral primary osteoarthritis of knee: Secondary | ICD-10-CM | POA: Diagnosis not present

## 2021-10-01 ENCOUNTER — Other Ambulatory Visit: Payer: Self-pay | Admitting: Family Medicine

## 2021-10-02 NOTE — Telephone Encounter (Signed)
Requested medication (s) are due for refill today: yes  Requested medication (s) are on the active medication list: yes  Last refill:  06/25/21 #90 with 0 RF  Future visit scheduled: 12/13/21  Notes to clinic:  eGFR greater than 180 days, 07/21/20, Creatinine out of normal range 05/02/21, please assess.  Requested Prescriptions  Pending Prescriptions Disp Refills   spironolactone (ALDACTONE) 25 MG tablet [Pharmacy Med Name: SPIRONOLACTONE 25MG TABLETS] 90 tablet 0    Sig: TAKE 1 TABLET(25 MG) BY MOUTH DAILY     Cardiovascular: Diuretics - Aldosterone Antagonist Failed - 10/01/2021 10:37 AM      Failed - Cr in normal range and within 180 days    Creat  Date Value Ref Range Status  05/02/2021 1.29 (H) 0.70 - 1.28 mg/dL Final   Creatinine, Urine  Date Value Ref Range Status  05/02/2021 128 20 - 320 mg/dL Final          Failed - eGFR is 30 or above and within 180 days    GFR, Est African American  Date Value Ref Range Status  07/21/2020 57 (L) > OR = 60 mL/min/1.29m Final   GFR, Est Non African American  Date Value Ref Range Status  07/21/2020 49 (L) > OR = 60 mL/min/1.760mFinal          Failed - Last BP in normal range    BP Readings from Last 1 Encounters:  07/25/21 (!) 155/75          Passed - K in normal range and within 180 days    Potassium  Date Value Ref Range Status  05/02/2021 4.3 3.5 - 5.3 mmol/L Final  08/25/2014 4.1 3.5 - 5.1 mmol/L Final          Passed - Na in normal range and within 180 days    Sodium  Date Value Ref Range Status  05/02/2021 137 135 - 146 mmol/L Final  10/19/2015 135 134 - 144 mmol/L Final  08/25/2014 133 (L) 136 - 145 mmol/L Final          Passed - Valid encounter within last 6 months    Recent Outpatient Visits           3 months ago Primary hypertension   [facility]   5 months ago Primary hypertension   [facility]   6 months ago Primary  hypertension   [facility] [facility]   7 months ago Dizziness   [facility]   8 months ago Screening for colon cancer   [place]       Future Appointments             In 2 months [facility]  [facility]

## 2021-10-02 NOTE — Telephone Encounter (Signed)
APPT made with wife

## 2021-10-05 ENCOUNTER — Other Ambulatory Visit: Payer: Self-pay

## 2021-10-05 ENCOUNTER — Ambulatory Visit (INDEPENDENT_AMBULATORY_CARE_PROVIDER_SITE_OTHER): Payer: Medicare Other | Admitting: Nurse Practitioner

## 2021-10-05 ENCOUNTER — Other Ambulatory Visit: Payer: Self-pay | Admitting: Nurse Practitioner

## 2021-10-05 ENCOUNTER — Encounter: Payer: Self-pay | Admitting: Nurse Practitioner

## 2021-10-05 VITALS — BP 154/78 | HR 91 | Temp 98.3°F | Resp 16 | Ht 72.0 in | Wt 230.8 lb

## 2021-10-05 DIAGNOSIS — E782 Mixed hyperlipidemia: Secondary | ICD-10-CM

## 2021-10-05 DIAGNOSIS — I10 Essential (primary) hypertension: Secondary | ICD-10-CM

## 2021-10-05 DIAGNOSIS — N1831 Chronic kidney disease, stage 3a: Secondary | ICD-10-CM | POA: Diagnosis not present

## 2021-10-05 MED ORDER — SPIRONOLACTONE 25 MG PO TABS: ORAL_TABLET | ORAL | 3 refills | Status: DC

## 2021-10-05 MED ORDER — PRAVASTATIN SODIUM 80 MG PO TABS: 80.0000 mg | ORAL_TABLET | Freq: Every day | ORAL | 3 refills | Status: DC

## 2021-10-05 MED ORDER — CLONIDINE HCL 0.2 MG PO TABS: ORAL_TABLET | ORAL | 0 refills | Status: DC

## 2021-10-05 MED ORDER — OLMESARTAN MEDOXOMIL 40 MG PO TABS: 40.0000 mg | ORAL_TABLET | Freq: Every day | ORAL | 0 refills | Status: DC

## 2021-10-05 NOTE — Progress Notes (Signed)
BP (!) 154/78 (Cuff Size: Large)    Pulse 91    Temp 98.3 F (36.8 C) (Oral)    Resp 16    Ht 6' (1.829 m)    Wt 230 lb 12.8 oz (104.7 kg)    SpO2 99%    BMI 31.30 kg/m    Subjective:    Patient ID: Jared Cline, male    DOB: [birth year], 76 y.o.   MRN: [id number]  HPI: Chaddrick Brue is a 76 y.o. male, here alone  Chief Complaint  Patient presents with   Hypertension   Hyperlipidemia    Follow up, medication refills   HTN: He says his blood pressure has been running 120/60-170/80.  He says he takes his medication everyday.  His blood pressure is 164/78 and retake is 154/78. He takes losartan 100 mg, spironolactone 25 mg daily, hydrochlorothiazide 25 mg daily, and clonidine 0.3 mg BID. He denies any chest pain, shortness of breath, headaches or blurred vision.  We are going to stop the losartan and hydrochlorothiazide and start Benicar 40 mg daily.  Decreasing clonidine 0.3 mg to 0.2 mg BID. He is going to come back in one week for blood pressure check and one month for follow up with me.   Hyperlipidemia: He says he takes his pravastatin 80 mg at bedtime. He denies any myalgia.  His last LDL 86.  Will get labs today.   Relevant past medical, surgical, family and social history reviewed and updated as indicated. Interim medical history since our last visit reviewed. Allergies and medications reviewed and updated.  Review of Systems  Constitutional: Negative for fever or weight change.  Respiratory: Negative for cough and shortness of breath.   Cardiovascular: Negative for chest pain or palpitations.  Gastrointestinal: Negative for abdominal pain, no bowel changes.  Musculoskeletal: Negative for gait problem or joint swelling.  Skin: Negative for rash.  Neurological: Negative for dizziness or headache.  No other specific complaints in a complete review of systems (except as listed in HPI above).      Objective:    BP (!) 154/78 (Cuff Size: Large)    Pulse 91    Temp 98.3 F (36.8 C)  (Oral)    Resp 16    Ht 6' (1.829 m)    Wt 230 lb 12.8 oz (104.7 kg)    SpO2 99%    BMI 31.30 kg/m   Wt Readings from Last 3 Encounters:  10/05/21 230 lb 12.8 oz (104.7 kg)  07/25/21 225 lb (102.1 kg)  07/05/21 231 lb 11.2 oz (105.1 kg)    Physical Exam  Constitutional: Patient appears well-developed and well-nourished. Obese  No distress.  HEENT: head atraumatic, normocephalic, pupils equal and reactive to light, neck supple Cardiovascular: Normal rate, regular rhythm and normal heart sounds.  No murmur heard. No BLE edema. Pulmonary/Chest: Effort normal and breath sounds normal. No respiratory distress. Abdominal: Soft.  There is no tenderness. Psychiatric: Patient has a normal mood and affect. behavior is normal. Judgment and thought content normal.   Results for orders placed or performed in visit on 07/25/21  Microscopic Examination   Urine  Result Value Ref Range   WBC, UA 0-5 0 - 5 /hpf   RBC None seen 0 - 2 /hpf   Epithelial Cells (non renal) 0-10 0 - 10 /hpf   Bacteria, UA None seen None seen/Few  Urinalysis, Complete  Result Value Ref Range   Specific Gravity, UA 1.020 1.005 - 1.030   pH, UA 6.5  5.0 - 7.5   Color, UA Yellow Yellow   Appearance Ur Clear Clear   Leukocytes,UA Negative Negative   Protein,UA Negative Negative/Trace   Glucose, UA Negative Negative   Ketones, UA Negative Negative   RBC, UA Negative Negative   Bilirubin, UA Negative Negative   Urobilinogen, Ur 0.2 0.2 - 1.0 mg/dL   Nitrite, UA Negative Negative   Microscopic Examination See below:       Assessment & Plan:   1. Primary hypertension -stop the losartan and hydrochlorothiazide -start benicar -changed dose on clinidine - CBC with Differential/Platelet - COMPLETE METABOLIC PANEL WITH GFR - cloNIDine (CATAPRES) 0.2 MG tablet; TAKE 1 TABLET(0.3 MG) BY MOUTH TWICE DAILY  Dispense: 180 tablet; Refill: 0 - olmesartan (BENICAR) 40 MG tablet; Take 1 tablet (40 mg total) by mouth daily.   Dispense: 30 tablet; Refill: 0 - spironolactone (ALDACTONE) 25 MG tablet; TAKE 1 TABLET(25 MG) BY MOUTH DAILY  Dispense: 90 tablet; Refill: 3  2. Mixed hyperlipidemia -continue current treatment - Lipid panel - COMPLETE METABOLIC PANEL WITH GFR - pravastatin (PRAVACHOL) 80 MG tablet; Take 1 tablet (80 mg total) by mouth at bedtime.  Dispense: 90 tablet; Refill: 3   Follow up plan: Return in about 4 weeks (around 11/02/2021) for follow up, come in one week for b/p check.

## 2021-10-06 LAB — COMPLETE METABOLIC PANEL WITH GFR
AG Ratio: 1.6 (calc) (ref 1.0–2.5)
ALT: 10 U/L (ref 9–46)
AST: 14 U/L (ref 10–35)
Albumin: 4.5 g/dL (ref 3.6–5.1)
Alkaline phosphatase (APISO): 70 U/L (ref 35–144)
BUN/Creatinine Ratio: 14 (calc) (ref 6–22)
BUN: 23 mg/dL (ref 7–25)
CO2: 28 mmol/L (ref 20–32)
Calcium: 10 mg/dL (ref 8.6–10.3)
Chloride: 100 mmol/L (ref 98–110)
Creat: 1.68 mg/dL — ABNORMAL HIGH (ref 0.70–1.28)
Globulin: 2.9 g/dL (calc) (ref 1.9–3.7)
Glucose, Bld: 93 mg/dL (ref 65–99)
Potassium: 5 mmol/L (ref 3.5–5.3)
Sodium: 136 mmol/L (ref 135–146)
Total Bilirubin: 0.4 mg/dL (ref 0.2–1.2)
Total Protein: 7.4 g/dL (ref 6.1–8.1)
eGFR: 42 mL/min/{1.73_m2} — ABNORMAL LOW (ref >=60)

## 2021-10-06 LAB — CBC WITH DIFFERENTIAL/PLATELET
Absolute Monocytes: 376 cells/uL (ref 200–950)
Basophils Absolute: 48 cells/uL (ref 0–200)
Basophils Relative: 0.9 %
Eosinophils Absolute: 170 cells/uL (ref 15–500)
Eosinophils Relative: 3.2 %
HCT: 40.2 % (ref 38.5–50.0)
Hemoglobin: 13.3 g/dL (ref 13.2–17.1)
Lymphs Abs: 1982 cells/uL (ref 850–3900)
MCH: 28.5 pg (ref 27.0–33.0)
MCHC: 33.1 g/dL (ref 32.0–36.0)
MCV: 86.1 fL (ref 80.0–100.0)
MPV: 10 fL (ref 7.5–12.5)
Monocytes Relative: 7.1 %
Neutro Abs: 2724 cells/uL (ref 1500–7800)
Neutrophils Relative %: 51.4 %
Platelets: 316 10*3/uL (ref 140–400)
RBC: 4.67 10*6/uL (ref 4.20–5.80)
RDW: 11.8 % (ref 11.0–15.0)
Total Lymphocyte: 37.4 %
WBC: 5.3 10*3/uL (ref 3.8–10.8)

## 2021-10-06 LAB — LIPID PANEL
Cholesterol: 234 mg/dL — ABNORMAL HIGH (ref <200)
HDL: 56 mg/dL (ref >=40)
LDL Cholesterol (Calc): 151 mg/dL (calc) — ABNORMAL HIGH
Non-HDL Cholesterol (Calc): 178 mg/dL (calc) — ABNORMAL HIGH (ref <130)
Total CHOL/HDL Ratio: 4.2 (calc) (ref <5.0)
Triglycerides: 145 mg/dL (ref <150)

## 2021-10-06 NOTE — Telephone Encounter (Signed)
Refused for duplicate request. Requested Prescriptions  Pending Prescriptions Disp Refills   olmesartan (BENICAR) 40 MG tablet [Pharmacy Med Name: OLMESARTAN MEDOXOMIL 40MG  TABLETS] 90 tablet     Sig: TAKE 1 TABLET(40 MG) BY MOUTH DAILY     Cardiovascular:  Angiotensin Receptor Blockers Failed - 10/05/2021  1:54 PM      Failed - Cr in normal range and within 180 days    Creat  Date Value Ref Range Status  05/02/2021 1.29 (H) 0.70 - 1.28 mg/dL Final   Creatinine, Urine  Date Value Ref Range Status  05/02/2021 128 20 - 320 mg/dL Final         Failed - Last BP in normal range    BP Readings from Last 1 Encounters:  10/05/21 (!) 154/78         Passed - K in normal range and within 180 days    Potassium  Date Value Ref Range Status  05/02/2021 4.3 3.5 - 5.3 mmol/L Final  08/25/2014 4.1 3.5 - 5.1 mmol/L Final         Passed - Patient is not pregnant      Passed - Valid encounter within last 6 months    Recent Outpatient Visits          Yesterday Primary hypertension   [facility] Bo Merino, FNP   3 months ago Primary hypertension   [facility] Delsa Grana, PA-C   5 months ago Primary hypertension   [place], DO   6 months ago Primary hypertension   [place], DO   7 months ago [facility] Myles Gip, DO      Future Appointments            In 3 weeks Reece Packer, Myna [place], [facility], [place]   In 2 months Delsa Grana, PA-C [facility], [place]   In 9 months  [facility], [facility]

## 2021-10-06 NOTE — Telephone Encounter (Signed)
Refused for duplicate request.  Requested Prescriptions  Pending Prescriptions Disp Refills   olmesartan (BENICAR) 40 MG tablet [Pharmacy Med Name: OLMESARTAN MEDOXOMIL 40MG  TABLETS] 90 tablet     Sig: TAKE 1 TABLET(40 MG) BY MOUTH DAILY     Cardiovascular:  Angiotensin Receptor Blockers Failed - 10/05/2021  1:54 PM      Failed - Cr in normal range and within 180 days    Creat  Date Value Ref Range Status  05/02/2021 1.29 (H) 0.70 - 1.28 mg/dL Final   Creatinine, Urine  Date Value Ref Range Status  05/02/2021 128 20 - 320 mg/dL Final          Failed - Last BP in normal range    BP Readings from Last 1 Encounters:  10/05/21 (!) 154/78          Passed - K in normal range and within 180 days    Potassium  Date Value Ref Range Status  05/02/2021 4.3 3.5 - 5.3 mmol/L Final  08/25/2014 4.1 3.5 - 5.1 mmol/L Final          Passed - Patient is not pregnant      Passed - Valid encounter within last 6 months    Recent Outpatient Visits           Yesterday Primary hypertension   [facility] Bo Merino, FNP   3 months ago Primary hypertension   [facility] Delsa Grana, PA-C   5 months ago Primary hypertension   [place], [place], DO   6 months ago Primary hypertension   [place], DO   7 months ago [facility] Myles Gip, DO       Future Appointments             In 3 weeks Reece Packer, Myna Hidalgo, [facility], [place]   In 2 months Delsa Grana, PA-C [facility], [place]   In 9 months  [facility], [facility]

## 2021-10-12 ENCOUNTER — Other Ambulatory Visit: Payer: Self-pay

## 2021-10-12 ENCOUNTER — Encounter: Payer: Self-pay | Admitting: Nurse Practitioner

## 2021-10-12 ENCOUNTER — Other Ambulatory Visit: Payer: Self-pay | Admitting: Nurse Practitioner

## 2021-10-12 ENCOUNTER — Ambulatory Visit (INDEPENDENT_AMBULATORY_CARE_PROVIDER_SITE_OTHER): Payer: Medicare Other | Admitting: Nurse Practitioner

## 2021-10-12 ENCOUNTER — Telehealth: Payer: Self-pay | Admitting: Nurse Practitioner

## 2021-10-12 VITALS — BP 144/82 | HR 77 | Temp 98.4°F | Resp 16 | Ht 72.0 in | Wt 230.0 lb

## 2021-10-12 DIAGNOSIS — I10 Essential (primary) hypertension: Secondary | ICD-10-CM

## 2021-10-12 DIAGNOSIS — G609 Hereditary and idiopathic neuropathy, unspecified: Secondary | ICD-10-CM | POA: Diagnosis not present

## 2021-10-12 LAB — MAGNESIUM: Magnesium: 2.2 mg/dL (ref 1.5–2.5)

## 2021-10-12 LAB — COMPREHENSIVE METABOLIC PANEL
AG Ratio: 1.8 (calc) (ref 1.0–2.5)
ALT: 8 U/L — ABNORMAL LOW (ref 9–46)
AST: 14 U/L (ref 10–35)
Albumin: 4.7 g/dL (ref 3.6–5.1)
Alkaline phosphatase (APISO): 56 U/L (ref 35–144)
BUN/Creatinine Ratio: 16 (calc) (ref 6–22)
BUN: 23 mg/dL (ref 7–25)
CO2: 26 mmol/L (ref 20–32)
Calcium: 10.6 mg/dL — ABNORMAL HIGH (ref 8.6–10.3)
Chloride: 107 mmol/L (ref 98–110)
Creat: 1.43 mg/dL — ABNORMAL HIGH (ref 0.70–1.28)
Globulin: 2.6 g/dL (calc) (ref 1.9–3.7)
Glucose, Bld: 102 mg/dL — ABNORMAL HIGH (ref 65–99)
Potassium: 5.3 mmol/L (ref 3.5–5.3)
Sodium: 141 mmol/L (ref 135–146)
Total Bilirubin: 0.5 mg/dL (ref 0.2–1.2)
Total Protein: 7.3 g/dL (ref 6.1–8.1)

## 2021-10-12 LAB — VITAMIN B12: Vitamin B-12: 390 pg/mL (ref 200–1100)

## 2021-10-12 MED ORDER — HYDROCHLOROTHIAZIDE 12.5 MG PO TABS: 12.5000 mg | ORAL_TABLET | Freq: Every day | ORAL | 0 refills | Status: DC

## 2021-10-12 MED ORDER — HYDRALAZINE HCL 10 MG PO TABS
10.0000 mg | ORAL_TABLET | Freq: Two times a day (BID) | ORAL | 0 refills | Status: DC | PRN
Start: 2021-10-12 — End: 2021-11-02

## 2021-10-12 NOTE — Telephone Encounter (Signed)
Pt.notified

## 2021-10-12 NOTE — Addendum Note (Signed)
Addended by: Serafina Royals F on: 10/12/2021 04:07 PM   Modules accepted: Orders

## 2021-10-12 NOTE — Addendum Note (Signed)
Addended by: Serafina Royals F on: 10/12/2021 11:54 AM   Modules accepted: Level of Service

## 2021-10-12 NOTE — Telephone Encounter (Signed)
Can you please call Jared Cline, and let him know that I would like him to take hydrochlorothiazide 12.5 mg every day.  I sent in a prescription.  Only take the hydralazine as needed for blood pressure greater than 150/90. We will eventually combine the hydrochlorothiazide with the Benicar, so he wont have to take so many medications.  Come back in one week for blood pressure check.

## 2021-10-12 NOTE — Progress Notes (Signed)
Patient here for blood pressure check.  He is currently on Benicar 40mg  and Clonidine0.3mg  bid.  Patient states at home blood pressure is still high and continues to run high no matter what.  He states in past he has had problem with potassium and magnesium levels.  He is currenlty taking both OTC.  Potassium level was just checked and normal, but patient thinks he needs higher dose of potassium that needs to be prescribed because that's why his blood pressure is high.  He has also been experiencing cramps in calf and fingers/hands. Blood pressure today is 144/82.  Me and Maricella checked it.  His wrist cuff is reading high.  I told him to try to return and get arm cuff instead.

## 2021-10-12 NOTE — Addendum Note (Signed)
Addended by: Serafina Royals F on: 10/12/2021 11:42 AM   Modules accepted: Orders

## 2021-10-12 NOTE — Progress Notes (Signed)
BP (!) 144/82    Pulse 77    Temp 98.4 F (36.9 C) (Oral)    Resp 16    Ht 6' (1.829 m)    Wt 230 lb (104.3 kg)    SpO2 99%    BMI 31.19 kg/m    Subjective:    Patient ID: Jared Cline, male    DOB: [birth year], 76 y.o.   MRN: [id number]  HPI: Jared Cline is a 76 y.o. male  No chief complaint on file.  Hypertension:  He was seen on 10/05/2021.  His blood pressure was changed at that visit. He was started on Benicar 40 mg and stopped losartan and hydrochlorothiazide. He says his blood pressure has been running high this week.  His blood pressure here was 144/82.  He says at home it has been running really high. His blood pressure cuff and the in office blood pressure were compared and were vastly different. Discussed getting a arm cuff for blood pressure instead of using the wrist cuff.  Prescribed him hydralazine for prn use when his blood pressure is greater than 140/90.  He denies any chest pain, shortness of breath, headache or blurred vision.  He is complaining of his hands tingling and his calves feel tight.  He says he thinks his potassium is low. Will check labs.  Peripheral neuropathy:  He says for the last two days he has had some tingling in his fingers and that they have felt cold.  He has good PMS.  Will check B12, magnesium and potassium.  He denies any other neurological symptoms.   Relevant past medical, surgical, family and social history reviewed and updated as indicated. Interim medical history since our last visit reviewed. Allergies and medications reviewed and updated.  Review of Systems  Constitutional: Negative for fever or weight change.  Respiratory: Negative for cough and shortness of breath.   Cardiovascular: Negative for chest pain or palpitations.  Gastrointestinal: Negative for abdominal pain, no bowel changes.  Musculoskeletal: Negative for gait problem or joint swelling.  Skin: Negative for rash.  Neurological: Negative for dizziness or headache.  No other  specific complaints in a complete review of systems (except as listed in HPI above).      Objective:    BP (!) 144/82    Pulse 77    Temp 98.4 F (36.9 C) (Oral)    Resp 16    Ht 6' (1.829 m)    Wt 230 lb (104.3 kg)    SpO2 99%    BMI 31.19 kg/m   Wt Readings from Last 3 Encounters:  10/12/21 230 lb (104.3 kg)  10/05/21 230 lb 12.8 oz (104.7 kg)  07/25/21 225 lb (102.1 kg)    Physical Exam  Constitutional: Patient appears well-developed and well-nourished. Obese  No distress.  HEENT: head atraumatic, normocephalic, pupils equal and reactive to light, neck supple Cardiovascular: Normal rate, regular rhythm and normal heart sounds.  No murmur heard. No BLE edema. Pulmonary/Chest: Effort normal and breath sounds normal. No respiratory distress. Abdominal: Soft.  There is no tenderness. Psychiatric: Patient has a normal mood and affect. behavior is normal. Judgment and thought content normal.  Neuro: cranial nerves intact, equal grip strength, no drift  Results for orders placed or performed in visit on 10/05/21  Lipid panel  Result Value Ref Range   Cholesterol 234 (H) <200 mg/dL   HDL 56 > OR = 40 mg/dL   Triglycerides 145 <150 mg/dL   LDL Cholesterol (Calc) 151 (H)  mg/dL (calc)   Total CHOL/HDL Ratio 4.2 <5.0 (calc)   Non-HDL Cholesterol (Calc) 178 (H) <130 mg/dL (calc)  CBC with Differential/Platelet  Result Value Ref Range   WBC 5.3 3.8 - 10.8 Thousand/uL   RBC 4.67 4.20 - 5.80 Million/uL   Hemoglobin 13.3 13.2 - 17.1 g/dL   HCT 40.2 38.5 - 50.0 %   MCV 86.1 80.0 - 100.0 fL   MCH 28.5 27.0 - 33.0 pg   MCHC 33.1 32.0 - 36.0 g/dL   RDW 11.8 11.0 - 15.0 %   Platelets 316 140 - 400 Thousand/uL   MPV 10.0 7.5 - 12.5 fL   Neutro Abs 2,724 1,500 - 7,800 cells/uL   Lymphs Abs 1,982 850 - 3,900 cells/uL   Absolute Monocytes 376 200 - 950 cells/uL   Eosinophils Absolute 170 15 - 500 cells/uL   Basophils Absolute 48 0 - 200 cells/uL   Neutrophils Relative % 51.4 %   Total  Lymphocyte 37.4 %   Monocytes Relative 7.1 %   Eosinophils Relative 3.2 %   Basophils Relative 0.9 %  COMPLETE METABOLIC PANEL WITH GFR  Result Value Ref Range   Glucose, Bld 93 65 - 99 mg/dL   BUN 23 7 - 25 mg/dL   Creat 1.68 (H) 0.70 - 1.28 mg/dL   eGFR 42 (L) > OR = 60 mL/min/1.54m   BUN/Creatinine Ratio 14 6 - 22 (calc)   Sodium 136 135 - 146 mmol/L   Potassium 5.0 3.5 - 5.3 mmol/L   Chloride 100 98 - 110 mmol/L   CO2 28 20 - 32 mmol/L   Calcium 10.0 8.6 - 10.3 mg/dL   Total Protein 7.4 6.1 - 8.1 g/dL   Albumin 4.5 3.6 - 5.1 g/dL   Globulin 2.9 1.9 - 3.7 g/dL (calc)   AG Ratio 1.6 1.0 - 2.5 (calc)   Total Bilirubin 0.4 0.2 - 1.2 mg/dL   Alkaline phosphatase (APISO) 70 35 - 144 U/L   AST 14 10 - 35 U/L   ALT 10 9 - 46 U/L      Assessment & Plan:   1. Primary hypertension  - Comprehensive Metabolic Panel (CMET) - hydrALAZINE (APRESOLINE) 10 MG tablet; Take 1 tablet (10 mg total) by mouth 2 (two) times daily as needed. Take if blood pressure greater than 140/90.  Dispense: 30 tablet; Refill: 0  2. Idiopathic peripheral neuropathy  - Comprehensive Metabolic Panel (CMET) - Magnesium - B12   Follow up plan: Return if symptoms worsen or fail to improve.

## 2021-10-15 ENCOUNTER — Other Ambulatory Visit: Payer: Self-pay

## 2021-10-15 DIAGNOSIS — I10 Essential (primary) hypertension: Secondary | ICD-10-CM

## 2021-10-15 NOTE — Telephone Encounter (Signed)
Error

## 2021-10-15 NOTE — Telephone Encounter (Signed)
Requested medications are due for refill today.  no  Requested medications are on the active medications list.  yes  Last refill. 10/12/2021 #30 0 refills  Future visit scheduled.   yes  Notes to clinic.  New Rx given 10/12/2021 #30 0 refills. Pt has enough medication to get to upcoming OV. Pt requesting 90 day supply.Please review.    Requested Prescriptions  Pending Prescriptions Disp Refills   hydrochlorothiazide (HYDRODIURIL) 12.5 MG tablet [Pharmacy Med Name: HYDROCHLOROTHIAZIDE 12.5MG  TABLETS] 90 tablet     Sig: TAKE 1 TABLET(12.5 MG) BY MOUTH DAILY     Cardiovascular: Diuretics - Thiazide Failed - 10/12/2021  4:16 PM      Failed - Cr in normal range and within 180 days    Creat  Date Value Ref Range Status  10/12/2021 1.43 (H) 0.70 - 1.28 mg/dL Final   Creatinine, Urine  Date Value Ref Range Status  05/02/2021 128 20 - 320 mg/dL Final          Failed - Last BP in normal range    BP Readings from Last 1 Encounters:  10/12/21 (!) 144/82          Passed - K in normal range and within 180 days    Potassium  Date Value Ref Range Status  10/12/2021 5.3 3.5 - 5.3 mmol/L Final  08/25/2014 4.1 3.5 - 5.1 mmol/L Final          Passed - Na in normal range and within 180 days    Sodium  Date Value Ref Range Status  10/12/2021 141 135 - 146 mmol/L Final  10/19/2015 135 134 - 144 mmol/L Final  08/25/2014 133 (L) 136 - 145 mmol/L Final          Passed - Valid encounter within last 6 months    Recent Outpatient Visits           1 week ago Primary hypertension   [facility] Bo Merino, FNP   3 months ago Primary hypertension   [facility] Delsa Grana, PA-C   5 months ago Primary hypertension   [place], [place], DO   6 months ago Primary hypertension   [place], DO   8 months ago [facility] Myles Gip, DO        Future Appointments             In 2 weeks Reece Packer, Myna Hidalgo, [facility], [place]   In 1 month Delsa Grana, PA-C [facility], [place]   In 8 months  [facility], [facility]

## 2021-10-24 ENCOUNTER — Ambulatory Visit: Payer: Medicare Other | Admitting: Urology

## 2021-10-24 DIAGNOSIS — Z8546 Personal history of malignant neoplasm of prostate: Secondary | ICD-10-CM | POA: Diagnosis not present

## 2021-10-24 DIAGNOSIS — Z9079 Acquired absence of other genital organ(s): Secondary | ICD-10-CM | POA: Diagnosis not present

## 2021-10-24 DIAGNOSIS — E7800 Pure hypercholesterolemia, unspecified: Secondary | ICD-10-CM | POA: Diagnosis not present

## 2021-10-24 DIAGNOSIS — N281 Cyst of kidney, acquired: Secondary | ICD-10-CM | POA: Diagnosis not present

## 2021-10-25 ENCOUNTER — Encounter: Payer: Self-pay | Admitting: Urology

## 2021-10-25 ENCOUNTER — Ambulatory Visit: Payer: Medicare Other | Admitting: Urology

## 2021-10-25 ENCOUNTER — Other Ambulatory Visit: Payer: Self-pay

## 2021-10-25 VITALS — BP 202/88 | HR 83 | Ht 72.0 in | Wt 230.0 lb

## 2021-10-25 DIAGNOSIS — R3911 Hesitancy of micturition: Secondary | ICD-10-CM

## 2021-10-25 DIAGNOSIS — N2889 Other specified disorders of kidney and ureter: Secondary | ICD-10-CM

## 2021-10-25 LAB — BLADDER SCAN AMB NON-IMAGING

## 2021-10-25 NOTE — Progress Notes (Signed)
? ?  10/25/2021 ?9:04 AM  ? ?Raynald Blend ?23-Oct-1945 ?[id number] ? ?Reason for visit: Follow up lower urinary tract symptoms, right renal mass ? ?HPI: ?76 year old male with distant history of open prostatectomy for prostate cancer and PSA remains undetectable, as well as a 1.3 cm right lower pole enhancing renal mass that has been stable since at least 2017, with most recent MRI in November 2022.  I personally viewed and interpreted the MRI that shows multiple small simple renal cysts, no hydronephrosis, and subtle small stable right lower pole renal mass.  At our last visit in December 2022 he underwent a cystoscopy that showed surgically absent prostate, no evidence of bladder neck contracture or stricture, and the bladder was grossly normal.  We had reviewed behavioral strategies and avoiding bladder irritants at that visit. ? ?He reports his symptoms have significantly improved by cutting back on tea and soda.  He is currently very satisfied with his urinary symptoms and really denies any complaints today.  Bladder scan is 182 mL, however he denies the urge to void and last voided a few hours ago. ? ?RTC 1 year symptom check regarding urinary symptoms ?Consider renal ultrasound every 2 years for very small right renal mass ? ?Billey Co, MD ? ?[place] ?[address] ?[place],  [address] ?[phone] ? ?

## 2021-11-01 ENCOUNTER — Other Ambulatory Visit: Payer: Self-pay | Admitting: Nurse Practitioner

## 2021-11-01 NOTE — Telephone Encounter (Signed)
Requested Prescriptions  ?Pending Prescriptions Disp Refills  ?? olmesartan (BENICAR) 40 MG tablet [Pharmacy Med Name: OLMESARTAN MEDOXOMIL '40MG'$  TABLETS] 90 tablet   ?  Sig: TAKE 1 TABLET(40 MG) BY MOUTH DAILY  ?  ? Cardiovascular:  Angiotensin Receptor Blockers Failed - 11/01/2021  1:54 PM  ?  ?  Failed - Cr in normal range and within 180 days  ?  Creat  ?Date Value Ref Range Status  ?10/12/2021 1.43 (H) 0.70 - 1.28 mg/dL Final  ? ?Creatinine, Urine  ?Date Value Ref Range Status  ?05/02/2021 128 20 - 320 mg/dL Final  ?   ?  ?  Failed - Last BP in normal range  ?  BP Readings from Last 1 Encounters:  ?10/25/21 (!) 202/88  ?   ?  ?  Passed - K in normal range and within 180 days  ?  Potassium  ?Date Value Ref Range Status  ?10/12/2021 5.3 3.5 - 5.3 mmol/L Final  ?08/25/2014 4.1 3.5 - 5.1 mmol/L Final  ?   ?  ?  Passed - Patient is not pregnant  ?  ?  Passed - Valid encounter within last 6 months  ?  Recent Outpatient Visits   ?      ? 3 weeks ago Primary hypertension  ? [place], FNP  ? 4 months ago Primary hypertension  ? [facility] [place], Krist[place], PA-C  ? 6 months ago Primary hypertension  ? [place], DO  ? 7 months ago Primary hypertension  ? [place], DO  ? 8 months ago Dizziness  ? [place], DO  ?  ?  ?Future Appointments   ?        ? Tomorrow Reece Packer, Myna Hidalgo, [facility], [place]  ? In 1 month Delsa Grana, PA-C [facility], [place]  ? In 8 months  [place]  ?  ? ?  ?  ?  ? ? ?

## 2021-11-02 ENCOUNTER — Encounter: Payer: Self-pay | Admitting: Nurse Practitioner

## 2021-11-02 ENCOUNTER — Other Ambulatory Visit: Payer: Self-pay

## 2021-11-02 ENCOUNTER — Ambulatory Visit (INDEPENDENT_AMBULATORY_CARE_PROVIDER_SITE_OTHER): Payer: Medicare Other | Admitting: Nurse Practitioner

## 2021-11-02 VITALS — BP 138/76 | HR 85 | Temp 98.6°F | Resp 16 | Ht 72.0 in | Wt 234.5 lb

## 2021-11-02 DIAGNOSIS — I10 Essential (primary) hypertension: Secondary | ICD-10-CM

## 2021-11-02 MED ORDER — HYDRALAZINE HCL 10 MG PO TABS: 10.0000 mg | ORAL_TABLET | Freq: Two times a day (BID) | ORAL | 0 refills | Status: DC | PRN

## 2021-11-02 NOTE — Progress Notes (Signed)
? ?BP 138/76 (BP Location: Right Arm)   Pulse 85   Temp 98.6 ?F (37 ?C) (Oral)   Resp 16   Ht 6' (1.829 m)   Wt 234 lb 8 oz (106.4 kg)   SpO2 99%   BMI 31.80 kg/m?   ? ?Subjective:  ? ? Patient ID: Jared Cline, male    DOB: [birth year], 76 y.o.   MRN: [id number] ? ?HPI: ?Jared Cline is a 76 y.o. male ? ?Chief Complaint  ?Patient presents with  ? Follow-up  ?  4 week recheck  ? ?HTN: 10/05/2021 he came in for a visit because his blood pressure was running high. He had reported blood pressures running between 120/60-170/80.  He reported that he was taking his medication everyday.  His blood pressure in the office that day was 164/78 and retake was 154/78.  He was taking losartan '100mg'$  daily, spironolactone 25 mg daily, hydrochlorothiazide 25 mg daily, and clonidine 0.3 mg BID. After discussing the case with Dr. Ancil Boozer we decided to stop the losartan and hydrochlorothiazide and start Benicar 40 mg daily.  Decreasing clonidine 0.3 mg to 0.2 mg BID. He came back one week later for a blood pressure check it was 144/82.  He had reported that his blood pressure was running high at home. Prescribed him hydralazine for prn use when his blood pressure is greater than 140/90.  He was seen by his nephrologist on 10/24/2021 and his blood pressure was 145/85. No adjustments to his medications were made at that time.  He is supposed to follow back up with nephrology in the beginning of April and bring his medications for reconciliation and adjustment.  He says he has been trying really hard to watch what he eats.  He has used the hydroxyzine one time.  He says his blood pressure has been running around 150/70-160/75. Discussed taking hydroxyzine when his blood pressure is greater than systolic 389 or diastolic 90.  His blood pressure today was 148/78 retake was 138/76.  He denies any chest pain, headaches or blurred vision.  Will continue with current treatment plan at this time.  He sees nephrologist in a couple of weeks and  reminded him to take his medication to his appointment so they can reconcile his medications. Patient is agreeable to plan.  ? ? ?Relevant past medical, surgical, family and social history reviewed and updated as indicated. Interim medical history since our last visit reviewed. ?Allergies and medications reviewed and updated. ? ?Review of Systems ? ?Constitutional: Negative for fever or weight change.  ?Respiratory: Negative for cough and shortness of breath.   ?Cardiovascular: Negative for chest pain or palpitations.  ?Gastrointestinal: Negative for abdominal pain, no bowel changes.  ?Musculoskeletal: Negative for gait problem or joint swelling.  ?Skin: Negative for rash.  ?Neurological: Negative for dizziness or headache.  ?No other specific complaints in a complete review of systems (except as listed in HPI above).  ? ?   ?Objective:  ?  ?BP 138/76 (BP Location: Right Arm)   Pulse 85   Temp 98.6 ?F (37 ?C) (Oral)   Resp 16   Ht 6' (1.829 m)   Wt 234 lb 8 oz (106.4 kg)   SpO2 99%   BMI 31.80 kg/m?   ?Wt Readings from Last 3 Encounters:  ?11/02/21 234 lb 8 oz (106.4 kg)  ?10/25/21 230 lb (104.3 kg)  ?10/12/21 230 lb (104.3 kg)  ?  ?Physical Exam ? ?Constitutional: Patient appears well-developed and well-nourished. Obese  No distress.  ?  HEENT: head atraumatic, normocephalic, pupils equal and reactive to light, neck supple ?Cardiovascular: Normal rate, regular rhythm and normal heart sounds.  No murmur heard. No BLE edema. ?Pulmonary/Chest: Effort normal and breath sounds normal. No respiratory distress. ?Abdominal: Soft.  There is no tenderness. ?Psychiatric: Patient has a normal mood and affect. behavior is normal. Judgment and thought content normal.  ? ?Results for orders placed or performed in visit on 10/25/21  ?BLADDER SCAN AMB NON-IMAGING  ?Result Value Ref Range  ? Scan Result 158m   ? ?   ?Assessment & Plan:  ? ?1. Primary hypertension ?-continue with current treatment plan ?-keep appointment with  nephrologist and take medications to appointment  ?- hydrALAZINE (APRESOLINE) 10 MG tablet; Take 1 tablet (10 mg total) by mouth 2 (two) times daily as needed. Take if blood pressure greater than 140/90.  Dispense: 30 tablet; Refill: 0  ? ?Follow up plan: ?Return in about 3 months (around 02/02/2022) for follow up. ? ? ? ? ? ?

## 2021-11-07 ENCOUNTER — Ambulatory Visit: Payer: Medicare Other | Admitting: Urology

## 2021-11-09 ENCOUNTER — Other Ambulatory Visit: Payer: Self-pay | Admitting: Nurse Practitioner

## 2021-11-09 ENCOUNTER — Telehealth: Payer: Self-pay

## 2021-11-09 DIAGNOSIS — I10 Essential (primary) hypertension: Secondary | ICD-10-CM

## 2021-11-09 MED ORDER — CLONIDINE HCL 0.2 MG PO TABS: 0.2000 mg | ORAL_TABLET | Freq: Two times a day (BID) | ORAL | 0 refills | Status: DC

## 2021-11-09 NOTE — Telephone Encounter (Signed)
Copied from [place] [id number]. Topic: Quick Communication - Rx Refill/Question >> Nov 09, 2021  3:06 PM Pawlus, Brayton Layman A wrote: Pharmacy called in needing clarification on the directions of the medication cloNIDine (CATAPRES) 0.2 MG tablet  - Sig: Take 1 tablet (0.2 mg total) by mouth 2 (two) times daily. TAKE 1 TABLET(0.3 MG) BY MOUTH TWICE DAILY  Caller needed to know which one was correct, please advise

## 2021-11-09 NOTE — Telephone Encounter (Signed)
Copied from [place] [id number]. Topic: General - Call Back - No Documentation ?>> Nov 01, 2021 12:59 PM Erick Blinks wrote: ?Reason for CRM: [facility] called requesting to speak to the clinic to clarify Rx instructions for cloNIDine (CATAPRES) 0.2 MG tablet  ? ?Best contact: [phone] ?>> Nov 06, 2021  3:32 PM Hollie Salk, RMA wrote: ?Patient came in for appointment ?>> Nov 02, 2021  2:12 PM Hollie Salk, [place] wrote: ?Patient came in for appointment on 3/17 ?

## 2021-11-09 NOTE — Telephone Encounter (Signed)
Called and clarified rx

## 2021-11-15 DIAGNOSIS — H2513 Age-related nuclear cataract, bilateral: Secondary | ICD-10-CM | POA: Diagnosis not present

## 2021-11-15 DIAGNOSIS — H5203 Hypermetropia, bilateral: Secondary | ICD-10-CM | POA: Diagnosis not present

## 2021-11-15 DIAGNOSIS — H35033 Hypertensive retinopathy, bilateral: Secondary | ICD-10-CM | POA: Diagnosis not present

## 2021-11-21 DIAGNOSIS — Z8546 Personal history of malignant neoplasm of prostate: Secondary | ICD-10-CM | POA: Diagnosis not present

## 2021-11-21 DIAGNOSIS — N1832 Chronic kidney disease, stage 3b: Secondary | ICD-10-CM | POA: Diagnosis not present

## 2021-11-21 DIAGNOSIS — E785 Hyperlipidemia, unspecified: Secondary | ICD-10-CM | POA: Diagnosis not present

## 2021-11-21 DIAGNOSIS — N281 Cyst of kidney, acquired: Secondary | ICD-10-CM | POA: Diagnosis not present

## 2021-12-13 ENCOUNTER — Ambulatory Visit (INDEPENDENT_AMBULATORY_CARE_PROVIDER_SITE_OTHER): Payer: Medicare Other | Admitting: Physician Assistant

## 2021-12-13 ENCOUNTER — Encounter: Payer: Self-pay | Admitting: Physician Assistant

## 2021-12-13 VITALS — BP 132/70 | HR 81 | Resp 16 | Ht 72.0 in | Wt 233.0 lb

## 2021-12-13 DIAGNOSIS — I10 Essential (primary) hypertension: Secondary | ICD-10-CM | POA: Diagnosis not present

## 2021-12-13 DIAGNOSIS — N1831 Chronic kidney disease, stage 3a: Secondary | ICD-10-CM

## 2021-12-13 DIAGNOSIS — R0981 Nasal congestion: Secondary | ICD-10-CM | POA: Diagnosis not present

## 2021-12-13 DIAGNOSIS — Z5181 Encounter for therapeutic drug level monitoring: Secondary | ICD-10-CM

## 2021-12-13 DIAGNOSIS — E782 Mixed hyperlipidemia: Secondary | ICD-10-CM

## 2021-12-13 DIAGNOSIS — G609 Hereditary and idiopathic neuropathy, unspecified: Secondary | ICD-10-CM

## 2021-12-13 NOTE — Patient Instructions (Addendum)
For your congestion I recommend you try taking a second generation antihistamine such as Allegra, Zyrtec, or Claritin, generics of these medications are typically just as effective- you can ask your pharmacist to point them out to you  ?You can also use a nasal saline spray to help reduce irritation and help clear your sinus passages ?Some people also find that a Netipot benefits their allergies- if you use this, only use clean, distilled water or boiled and cooled water to prevent infections or complications.  ? ?I recommend taking your blood pressure at least once per day and writing that down, bring these records in with you to your follow up so we can make sure it is staying well controlled ? ?If you are getting multiple readings over 145/ 80 please make an apt before the 3 months follow up to evaluate  ? ? ? ?

## 2021-12-13 NOTE — Assessment & Plan Note (Addendum)
Chronic, historic condition  ?Appears to be well managed with Clonidine 0.2 mg PO BID, Hydralazine 10 mg PO BID PRN, HCTZ 12.5 mg PO QD, Olmesartan 40 mg PO QD, and Spironolactone 25 mg PO QD ?Previous Nephrology note from 11/21/2021 states he should hold HCTZ to see if this helps with leg cramps. Patient indicated on intake paperwork that he was taking all medications as of today. ?Unsure what BP readings are at home as patient does not have cuff.  ?Recommend he start taking BP measures at home and record these to bring to follow up to make BP is well controlled ?States he is trying to reduce junk food consumption,  ?Has tried to increase physical activity to assist with overall health  ?Recommend he continue current medication regimen ?Follow up in 3 months for monitoring considering previously difficult HTN control ? ?

## 2021-12-13 NOTE — Progress Notes (Signed)
? ?Established Patient Office Visit ? ?Subjective   ?Patient ID: Jared Cline, male    DOB: 04-Dec-1945  Age: 76 y.o. MRN: [id number] ? ?Today's Provider: Talitha Givens, MHS, PA-C ?Introduced myself to the patient as a Journalist, newspaper and provided education on APPs in clinical practice.  ? ? ?Chief Complaint  ?Patient presents with  ? Follow-up  ? ? ?HPI ? ?Follow up for HTN  ?Recent elevated BP at the beginning of the month at specialist office ?He is not taking BP at home - needs a new cuff  ?He is taking all of his prescribed BP medications as directed ?Patient reports after recent visit with his Nephrologist he has been trying to make better dietary choices to prevent further progression ?He has tried to cut out snacks on junk food  ?He reports he is trying to walk as much as he can but he gets cramps in his calves  ?Reports he is walking about 4 blocks once per day - slight incline  ?Water intake: drinking about 80 oz per day ?States he notices his left ankle swells somewhat ? ?Reports he has had congestion and mild cough for the past 2 weeks ?He has tried Mucinex for this - has not helped much for cough  ? ? ? ? ?Outpatient Encounter Medications as of 12/13/2021  ?Medication Sig  ? aspirin EC 81 MG tablet Take 81 mg by mouth daily.  ? cloNIDine (CATAPRES) 0.2 MG tablet Take 1 tablet (0.2 mg total) by mouth 2 (two) times daily. TAKE 1 TABLET(0.3 MG) BY MOUTH TWICE DAILY  ? hydrALAZINE (APRESOLINE) 10 MG tablet Take 1 tablet (10 mg total) by mouth 2 (two) times daily as needed. Take if blood pressure greater than 140/90.  ? hydrochlorothiazide (HYDRODIURIL) 12.5 MG tablet TAKE 1 TABLET(12.5 MG) BY MOUTH DAILY  ? magnesium oxide (MAG-OX) 400 MG tablet Take 400 mg by mouth daily.  ? meloxicam (MOBIC) 15 MG tablet Take 15 mg by mouth daily.  ? olmesartan (BENICAR) 40 MG tablet TAKE 1 TABLET(40 MG) BY MOUTH DAILY  ? Potassium Gluconate 550 MG TABS Take 550 mg by mouth daily.  ? pravastatin (PRAVACHOL) 80 MG tablet Take 1 tablet (80 mg  total) by mouth at bedtime.  ? spironolactone (ALDACTONE) 25 MG tablet TAKE 1 TABLET(25 MG) BY MOUTH DAILY  ? ?No facility-administered encounter medications on file as of 12/13/2021.  ?  ? ?Patient Active Problem List  ? Diagnosis Date Noted  ? Hypercalcemia 10/24/2021  ? Coronary artery disease of native artery of native heart with stable angina pectoris ([place]) 07/17/2021  ? History of radical prostatectomy 07/04/2021  ? Cyst of kidney, acquired 07/04/2021  ? Secondary osteoarthritis, unspecified site 05/30/2021  ? Pure hypercholesterolemia, unspecified 05/30/2021  ? Urinary frequency 05/02/2021  ? Dizziness 02/14/2021  ? Osteoarthritis of knees, bilateral 12/28/2020  ? Numbness and tingling of both lower extremities 04/19/2020  ? Swelling of both lower extremities 04/19/2020  ? Numbness of fingers of both hands 04/19/2020  ? PVD (peripheral vascular disease) ([place]) 03/24/2020  ? Leg pain, bilateral 03/24/2020  ? LVH (left ventricular hypertrophy) due to hypertensive disease, without heart failure 05/06/2018  ? First degree AV block 04/15/2018  ? Coronary artery disease involving native coronary artery of native heart 04/15/2018  ? Medication monitoring encounter 06/09/2017  ? Coronary artery calcification seen on CAT scan 03/20/2017  ? OSA on CPAP 11/22/2015  ? Atherosclerosis of native arteries of extremity with intermittent claudication ([place]) 10/25/2015  ? Right renal mass  10/19/2015  ? Hyperlipidemia   ? Hypertension   ? Chronic kidney disease, stage 3 (HCC)   ? Carpal tunnel syndrome, left   ? History of prostate cancer   ? Multiple lung nodules   ? ? ? ?Review of Systems  ?Constitutional:  Negative for malaise/fatigue.  ?HENT:  Positive for congestion.   ?Eyes:  Positive for discharge.  ?Respiratory:  Positive for cough. Negative for sputum production and shortness of breath.   ?Cardiovascular:  Negative for chest pain, palpitations and leg swelling.  ?Neurological:  Negative for dizziness, tingling, tremors,  weakness and headaches.  ? ?  ?Objective:  ?  ? ?BP 132/70   Pulse 81   Resp 16   Ht 6' (1.829 m)   Wt 233 lb (105.7 kg)   SpO2 99%   BMI 31.60 kg/m?  ? ? ?Physical Exam ?Vitals reviewed.  ?Constitutional:   ?   General: He is awake.  ?   Appearance: Normal appearance. He is obese.  ?HENT:  ?   Head: Normocephalic and atraumatic.  ?Cardiovascular:  ?   Rate and Rhythm: Normal rate and regular rhythm.  ?   Pulses: Normal pulses.     ?     Radial pulses are 2+ on the right side and 2+ on the left side.  ?   Heart sounds: Normal heart sounds.  ?   Comments: No carotid bruit on auscultation ?Pulmonary:  ?   Effort: Pulmonary effort is normal.  ?   Breath sounds: Normal breath sounds and air entry. No decreased breath sounds, wheezing, rhonchi or rales.  ?Neurological:  ?   Mental Status: He is alert.  ?Psychiatric:     ?   Attention and Perception: Attention normal.     ?   Mood and Affect: Mood and affect normal.     ?   Speech: Speech normal.     ?   Behavior: Behavior normal. Behavior is cooperative.  ? ? ? ?No results found for any visits on 12/13/21. ? ?Last CBC ?Lab Results  ?Component Value Date  ? WBC 5.3 10/05/2021  ? HGB 13.3 10/05/2021  ? HCT 40.2 10/05/2021  ? MCV 86.1 10/05/2021  ? MCH 28.5 10/05/2021  ? RDW 11.8 10/05/2021  ? PLT 316 10/05/2021  ? ?Last metabolic panel ?Lab Results  ?Component Value Date  ? GLUCOSE 102 (H) 10/12/2021  ? NA 141 10/12/2021  ? K 5.3 10/12/2021  ? CL 107 10/12/2021  ? CO2 26 10/12/2021  ? BUN 23 10/12/2021  ? CREATININE 1.43 (H) 10/12/2021  ? EGFR 42 (L) 10/05/2021  ? CALCIUM 10.6 (H) 10/12/2021  ? PROT 7.3 10/12/2021  ? ALBUMIN 4.3 11/11/2016  ? LABGLOB 2.6 10/19/2015  ? AGRATIO 1.7 10/19/2015  ? BILITOT 0.5 10/12/2021  ? ALKPHOS 60 11/11/2016  ? AST 14 10/12/2021  ? ALT 8 (L) 10/12/2021  ? ANIONGAP 6 (L) 08/25/2014  ? ?Last lipids ?Lab Results  ?Component Value Date  ? CHOL 234 (H) 10/05/2021  ? HDL 56 10/05/2021  ? LDLCALC 151 (H) 10/05/2021  ? TRIG 145 10/05/2021  ?  CHOLHDL 4.2 10/05/2021  ? ?Last hemoglobin A1c ?No results found for: HGBA1C ?Last thyroid functions ?Lab Results  ?Component Value Date  ? TSH 1.10 04/19/2020  ? ?Last vitamin D ?No results found for: 25OHVITD2, [place], VD25OH ?Last vitamin B12 and Folate ?Lab Results  ?Component Value Date  ? [id number] 10/12/2021  ? ?  ? ?The 10-year ASCVD risk score (Arnett  DK, et al., 2019) is: 24.9% ? ?  ?Assessment & Plan:  ? ?Problem List Items Addressed This Visit   ? ?  ? Cardiovascular and Mediastinum  ? Hypertension - Primary (Chronic)  ?  Chronic, historic condition  ?Appears to be well managed with Clonidine 0.2 mg PO BID, Hydralazine 10 mg PO BID PRN, HCTZ 12.5 mg PO QD, Olmesartan 40 mg PO QD, and Spironolactone 25 mg PO QD ?Previous Nephrology note from 11/21/2021 states he should hold HCTZ to see if this helps with leg cramps. Patient indicated on intake paperwork that he was taking all medications as of today. ?Unsure what BP readings are at home as patient does not have cuff.  ?Recommend he start taking BP measures at home and record these to bring to follow up to make BP is well controlled ?States he is trying to reduce junk food consumption,  ?Has tried to increase physical activity to assist with overall health  ?Recommend he continue current medication regimen ?Follow up in 3 months for monitoring considering previously difficult HTN control ? ? ?  ?  ? ?Other Visit Diagnoses   ? ? Mild nasal congestion   ?Acute, new problem ?Reports mild nasal congestion and some coughing for the past few weeks ?Denies fevers, sinus pain, pressure, headaches, sore throat ?Suspect this is likely allergic in nature given timing and symptom constellation ?Recommend 2nd gen antihistamine per his preference to manage ?Follow up as needed for persistent or progressing symptoms ?    ? ?  ? ? ?Return in about 3 months (around 03/14/2022) for HTN.  ? ? ?The entirety of the information documented in the History of Present Illness,  Review of Systems and Physical Exam were personally obtained by me. Portions of this information were initially documented by the CMA and reviewed by me for thoroughness and accuracy.  ? ?Dani Gobble Jerard Bays, PA-

## 2022-01-01 DIAGNOSIS — I10 Essential (primary) hypertension: Secondary | ICD-10-CM | POA: Diagnosis not present

## 2022-01-01 DIAGNOSIS — I25118 Atherosclerotic heart disease of native coronary artery with other forms of angina pectoris: Secondary | ICD-10-CM | POA: Diagnosis not present

## 2022-01-01 DIAGNOSIS — I70219 Atherosclerosis of native arteries of extremities with intermittent claudication, unspecified extremity: Secondary | ICD-10-CM | POA: Diagnosis not present

## 2022-01-01 DIAGNOSIS — I700 Atherosclerosis of aorta: Secondary | ICD-10-CM | POA: Diagnosis not present

## 2022-01-02 DIAGNOSIS — N1831 Chronic kidney disease, stage 3a: Secondary | ICD-10-CM | POA: Diagnosis not present

## 2022-01-02 DIAGNOSIS — N2889 Other specified disorders of kidney and ureter: Secondary | ICD-10-CM | POA: Diagnosis not present

## 2022-01-02 DIAGNOSIS — M1993 Secondary osteoarthritis, unspecified site: Secondary | ICD-10-CM | POA: Diagnosis not present

## 2022-01-02 DIAGNOSIS — I10 Essential (primary) hypertension: Secondary | ICD-10-CM | POA: Diagnosis not present

## 2022-01-09 ENCOUNTER — Other Ambulatory Visit: Payer: Self-pay | Admitting: Family Medicine

## 2022-01-16 DIAGNOSIS — I70219 Atherosclerosis of native arteries of extremities with intermittent claudication, unspecified extremity: Secondary | ICD-10-CM | POA: Diagnosis not present

## 2022-01-23 DIAGNOSIS — I25118 Atherosclerotic heart disease of native coronary artery with other forms of angina pectoris: Secondary | ICD-10-CM | POA: Diagnosis not present

## 2022-01-28 DIAGNOSIS — I2510 Atherosclerotic heart disease of native coronary artery without angina pectoris: Secondary | ICD-10-CM | POA: Diagnosis not present

## 2022-01-28 DIAGNOSIS — G4733 Obstructive sleep apnea (adult) (pediatric): Secondary | ICD-10-CM | POA: Diagnosis not present

## 2022-01-28 DIAGNOSIS — E782 Mixed hyperlipidemia: Secondary | ICD-10-CM | POA: Diagnosis not present

## 2022-01-28 DIAGNOSIS — I10 Essential (primary) hypertension: Secondary | ICD-10-CM | POA: Diagnosis not present

## 2022-02-17 ENCOUNTER — Other Ambulatory Visit: Payer: Self-pay | Admitting: Nurse Practitioner

## 2022-02-17 DIAGNOSIS — I10 Essential (primary) hypertension: Secondary | ICD-10-CM

## 2022-02-18 NOTE — Telephone Encounter (Signed)
Requested Prescriptions  Pending Prescriptions Disp Refills  . hydrochlorothiazide (HYDRODIURIL) 12.5 MG tablet [Pharmacy Med Name: HYDROCHLOROTHIAZIDE 12.'5MG'$  TABLETS] 90 tablet 0    Sig: TAKE 1 TABLET(12.5 MG) BY MOUTH DAILY     Cardiovascular: Diuretics - Thiazide Failed - 02/17/2022 12:59 PM      Failed - Cr in normal range and within 180 days    Creat  Date Value Ref Range Status  10/12/2021 1.43 (H) 0.70 - 1.28 mg/dL Final   Creatinine, Urine  Date Value Ref Range Status  05/02/2021 128 20 - 320 mg/dL Final         Passed - K in normal range and within 180 days    Potassium  Date Value Ref Range Status  10/12/2021 5.3 3.5 - 5.3 mmol/L Final  08/25/2014 4.1 3.5 - 5.1 mmol/L Final         Passed - Na in normal range and within 180 days    Sodium  Date Value Ref Range Status  10/12/2021 141 135 - 146 mmol/L Final  10/19/2015 135 134 - 144 mmol/L Final  08/25/2014 133 (L) 136 - 145 mmol/L Final         Passed - Last BP in normal range    BP Readings from Last 1 Encounters:  12/13/21 132/70         Passed - Valid encounter within last 6 months    Recent Outpatient Visits          2 months ago Primary hypertension   [place], Dani Gobble, PA-C   3 months ago Primary hypertension   [facility] Bo Merino, FNP   4 months ago Primary hypertension   [facility] Bo Merino, FNP   8 months ago Primary hypertension   [facility] Delsa Grana, PA-C   9 months ago Primary hypertension   [facility] Myles Gip, DO      Future Appointments            In 3 weeks Reece Packer, Myna Hidalgo, [facility], [place]   In 4 months  [facility], [facility]

## 2022-02-20 ENCOUNTER — Encounter: Payer: Self-pay | Admitting: Nurse Practitioner

## 2022-02-20 ENCOUNTER — Other Ambulatory Visit: Payer: Self-pay

## 2022-02-20 ENCOUNTER — Ambulatory Visit (INDEPENDENT_AMBULATORY_CARE_PROVIDER_SITE_OTHER): Payer: Medicare Other | Admitting: Nurse Practitioner

## 2022-02-20 VITALS — BP 138/80 | HR 81 | Temp 97.7°F | Resp 16 | Ht 72.0 in | Wt 218.1 lb

## 2022-02-20 DIAGNOSIS — R42 Dizziness and giddiness: Secondary | ICD-10-CM

## 2022-02-20 MED ORDER — MECLIZINE HCL 25 MG PO TABS: 25.0000 mg | ORAL_TABLET | Freq: Three times a day (TID) | ORAL | 0 refills | Status: DC | PRN

## 2022-02-20 NOTE — Progress Notes (Signed)
BP 138/80   Pulse 81   Temp 97.7 F (36.5 C) (Oral)   Resp 16   Ht 6' (1.829 m)   Wt 218 lb 1.6 oz (98.9 kg)   SpO2 98%   BMI 29.58 kg/m    Subjective:    Patient ID: Jared Cline, male    DOB: 01/13/1946, 76 y.o.   MRN: [id number]  HPI: Jared Cline is a 76 y.o. male  Chief Complaint  Patient presents with   Dizziness    falling   Dizziness: Patient reports for the last 2 weeks he has had dizziness off and on.  Patient states that he has gotten so dizzy that he has fallen twice.  Patient denies any dizziness from moving his head back and forth.  States he notices it mostly when he goes from a sitting position to a standing position.  Performed orthostatics they were negative.  Patient denies any headache, shortness of breath, chest pain, headaches or any other neurological symptoms.  Patient's Romberg test was positive.  Discussed with patient about getting lab work and will refer to neurology.  Discussed reasons to seek emergency care.  Also discussed with patient about changing positions slowly to help prevent falls.  Patient denies any injury from the falls.  Orthostatic: laying 130/70, 79 Sitting 138/80, 75 Standing 130/80, 80  Relevant past medical, surgical, family and social history reviewed and updated as indicated. Interim medical history since our last visit reviewed. Allergies and medications reviewed and updated.  Review of Systems Constitutional: Negative for fever or weight change.  Respiratory: Negative for cough and shortness of breath.   Cardiovascular: Negative for chest pain or palpitations.  Gastrointestinal: Negative for abdominal pain, no bowel changes.  Musculoskeletal: Negative for gait problem or joint swelling.  Skin: Negative for rash.  Neurological: positive for dizziness, negative for headache.  No other specific complaints in a complete review of systems (except as listed in HPI above).      Objective:    BP 138/80   Pulse 81   Temp 97.7 F  (36.5 C) (Oral)   Resp 16   Ht 6' (1.829 m)   Wt 218 lb 1.6 oz (98.9 kg)   SpO2 98%   BMI 29.58 kg/m   Wt Readings from Last 3 Encounters:  02/20/22 218 lb 1.6 oz (98.9 kg)  12/13/21 233 lb (105.7 kg)  11/02/21 234 lb 8 oz (106.4 kg)    Physical Exam  Constitutional: Patient appears well-developed and well-nourished. Obese  No distress.  HEENT: head atraumatic, normocephalic, pupils equal and reactive to light, ears TMs clear, neck supple, throat within normal limits Cardiovascular: Normal rate, regular rhythm and normal heart sounds.  No murmur heard. No BLE edema. Pulmonary/Chest: Effort normal and breath sounds normal. No respiratory distress. Abdominal: Soft.  There is no tenderness. NEURO: Cranial nerves intact, equal grip strength, positive Romberg test Psychiatric: Patient has a normal mood and affect. behavior is normal. Judgment and thought content normal.  Results for orders placed or performed in visit on 10/25/21  BLADDER SCAN AMB NON-IMAGING  Result Value Ref Range   Scan Result 137m       Assessment & Plan:   Problem List Items Addressed This Visit       Other   Dizziness - Primary   Relevant Medications   meclizine (ANTIVERT) 25 MG tablet   Other Relevant Orders   CBC with Differential/Platelet   COMPLETE METABOLIC PANEL WITH GFR   Hemoglobin A1c  Ambulatory referral to Neurology     Follow up plan: Return if symptoms worsen or fail to improve.

## 2022-02-20 NOTE — Progress Notes (Signed)
dizz

## 2022-02-21 LAB — COMPLETE METABOLIC PANEL WITH GFR
AG Ratio: 1.7 (calc) (ref 1.0–2.5)
ALT: 10 U/L (ref 9–46)
AST: 15 U/L (ref 10–35)
Albumin: 4.5 g/dL (ref 3.6–5.1)
Alkaline phosphatase (APISO): 60 U/L (ref 35–144)
BUN/Creatinine Ratio: 11 (calc) (ref 6–22)
BUN: 17 mg/dL (ref 7–25)
CO2: 26 mmol/L (ref 20–32)
Calcium: 10 mg/dL (ref 8.6–10.3)
Chloride: 103 mmol/L (ref 98–110)
Creat: 1.55 mg/dL — ABNORMAL HIGH (ref 0.70–1.28)
Globulin: 2.6 g/dL (calc) (ref 1.9–3.7)
Glucose, Bld: 98 mg/dL (ref 65–99)
Potassium: 5 mmol/L (ref 3.5–5.3)
Sodium: 137 mmol/L (ref 135–146)
Total Bilirubin: 0.3 mg/dL (ref 0.2–1.2)
Total Protein: 7.1 g/dL (ref 6.1–8.1)
eGFR: 46 mL/min/{1.73_m2} — ABNORMAL LOW (ref >=60)

## 2022-02-21 LAB — HEMOGLOBIN A1C
Hgb A1c MFr Bld: 5.8 % of total Hgb — ABNORMAL HIGH (ref <5.7)
Mean Plasma Glucose: 120 mg/dL
eAG (mmol/L): 6.6 mmol/L

## 2022-02-21 LAB — CBC WITH DIFFERENTIAL/PLATELET
Absolute Monocytes: 522 cells/uL (ref 200–950)
Basophils Absolute: 42 cells/uL (ref 0–200)
Basophils Relative: 0.7 %
Eosinophils Absolute: 162 cells/uL (ref 15–500)
Eosinophils Relative: 2.7 %
HCT: 38.3 % — ABNORMAL LOW (ref 38.5–50.0)
Hemoglobin: 12.5 g/dL — ABNORMAL LOW (ref 13.2–17.1)
Lymphs Abs: 2424 cells/uL (ref 850–3900)
MCH: 28 pg (ref 27.0–33.0)
MCHC: 32.6 g/dL (ref 32.0–36.0)
MCV: 85.9 fL (ref 80.0–100.0)
MPV: 10.3 fL (ref 7.5–12.5)
Monocytes Relative: 8.7 %
Neutro Abs: 2850 cells/uL (ref 1500–7800)
Neutrophils Relative %: 47.5 %
Platelets: 311 10*3/uL (ref 140–400)
RBC: 4.46 10*6/uL (ref 4.20–5.80)
RDW: 12.3 % (ref 11.0–15.0)
Total Lymphocyte: 40.4 %
WBC: 6 10*3/uL (ref 3.8–10.8)

## 2022-03-07 DIAGNOSIS — M170 Bilateral primary osteoarthritis of knee: Secondary | ICD-10-CM | POA: Diagnosis not present

## 2022-03-14 ENCOUNTER — Ambulatory Visit: Payer: Medicare Other | Admitting: Nurse Practitioner

## 2022-03-21 ENCOUNTER — Encounter: Payer: Self-pay | Admitting: Nurse Practitioner

## 2022-03-21 ENCOUNTER — Telehealth: Payer: Self-pay | Admitting: Nurse Practitioner

## 2022-03-21 ENCOUNTER — Ambulatory Visit: Payer: Medicare Other | Admitting: Nurse Practitioner

## 2022-03-21 VITALS — BP 146/78 | HR 92 | Temp 98.6°F | Resp 16 | Ht 72.0 in | Wt 223.7 lb

## 2022-03-21 DIAGNOSIS — I10 Essential (primary) hypertension: Secondary | ICD-10-CM

## 2022-03-21 DIAGNOSIS — Z9989 Dependence on other enabling machines and devices: Secondary | ICD-10-CM

## 2022-03-21 DIAGNOSIS — G4733 Obstructive sleep apnea (adult) (pediatric): Secondary | ICD-10-CM

## 2022-03-21 DIAGNOSIS — I700 Atherosclerosis of aorta: Secondary | ICD-10-CM

## 2022-03-21 DIAGNOSIS — N1831 Chronic kidney disease, stage 3a: Secondary | ICD-10-CM

## 2022-03-21 DIAGNOSIS — E782 Mixed hyperlipidemia: Secondary | ICD-10-CM

## 2022-03-21 DIAGNOSIS — I25118 Atherosclerotic heart disease of native coronary artery with other forms of angina pectoris: Secondary | ICD-10-CM

## 2022-03-21 DIAGNOSIS — R200 Anesthesia of skin: Secondary | ICD-10-CM

## 2022-03-21 DIAGNOSIS — R42 Dizziness and giddiness: Secondary | ICD-10-CM

## 2022-03-21 MED ORDER — CLONIDINE HCL 0.2 MG PO TABS: 0.2000 mg | ORAL_TABLET | Freq: Two times a day (BID) | ORAL | 1 refills | Status: DC

## 2022-03-21 NOTE — Assessment & Plan Note (Signed)
Patient has neurology appointment next week.

## 2022-03-21 NOTE — Telephone Encounter (Signed)
Pt stated he was told by PCP to call back when he got home check his medications and make sure he was taking the medication pravastatin (PRAVACHOL) 80 MG tablet.  Pt stated yes, he is taking one tablet at night.

## 2022-03-21 NOTE — Assessment & Plan Note (Signed)
Patient is getting go home and check and see if he is actually taking his pravastatin.  Patient is prescribed pravastatin 80 mg daily.  But cholesterol LDL was 151 on 10/05/2021.  We will verify that he is taking the medication prior to any changes.

## 2022-03-21 NOTE — Telephone Encounter (Signed)
FYI

## 2022-03-21 NOTE — Assessment & Plan Note (Signed)
Continue wearing CPAP at night.

## 2022-03-21 NOTE — Assessment & Plan Note (Signed)
Avoid NSAIDs like ibuprofen, Advil, Aleve.  She is drinking plenty of water.

## 2022-03-21 NOTE — Assessment & Plan Note (Signed)
Patient is currently taking spironolactone 25 mg daily, olmesartan 40 mg daily, hydrochlorothiazide 12.5 mg daily and hydralazine 10 mg 2 times daily as needed and clonidine 0.2 mg 2 times daily.  We will continue with current treatment plan.

## 2022-03-21 NOTE — Progress Notes (Signed)
BP (!) 146/78 (BP Location: Right Arm)   Pulse 92   Temp 98.6 F (37 C) (Oral)   Resp 16   Ht 6' (1.829 m)   Wt 223 lb 11.2 oz (101.5 kg)   SpO2 98%   BMI 30.34 kg/m    Subjective:    Patient ID: Jared Cline, male    DOB: 1946/01/19, 76 y.o.   MRN: [id number]  HPI: Jared Cline is a 76 y.o. male  Chief Complaint  Patient presents with   Follow-up   Hypertension: Pressure today is 162/76, retake was 146/78.  Patient states he did drink a couple coffee this morning he thinks that is why his blood pressure is up.  Is currently taking spironolactone 25 mg daily, olmesartan 40 mg daily, hydrochlorothiazide 12.5 mg daily, clonidine 0.2 mg 2 times daily and hydralazine 10 mg 2 times daily as needed.  Denies any chest pain, shortness of breath, headaches or blurred vision.  Hyperlipidemia/aortic atherosclerosis/CAD: Patient is currently prescribed pravastatin 80 mg at bedtime.  Patient is getting go home and check to make sure he is actually taking the pravastatin because his cholesterol was elevated last time we checked.  He denies any myalgia. his last LDL was 151 on 10/05/2021. The 10-year ASCVD risk score (Arnett DK, et al., 2019) is: 29.3%   Values used to calculate the score:     Age: 60 years     Sex: Male     Is Non-Hispanic African American: Yes     Diabetic: No     Tobacco smoker: No     Systolic Blood Pressure: 409 mmHg     Is BP treated: Yes     HDL Cholesterol: 56 mg/dL     Total Cholesterol: 234 mg/dL   CKD: His last GFR was 46 on 02/20/2022.  He is currently followed by nephrology.  Nephrology on 01/02/2022  OSA on CPAP: He says he wears his CPAP machine every night. Patient states he wakes up feeling well rested.  He says he does not wake up with a headache.    Dizziness: Patient was recently seen for episodic dizziness.  Referral was placed to neurology.  Patient does have an appoint with neurology next week.  Numbness angers: Patient has had off-and-on numbness in  bilateral hands.  Last time patient complained about this we did blood work and it was normal.  Patient does have an appointment with neurology next week.  Discussed with patient that he needs to bring that up with neurology.  She agrees with plan.  Relevant past medical, surgical, family and social history reviewed and updated as indicated. Interim medical history since our last visit reviewed. Allergies and medications reviewed and updated.  Review of Systems  Constitutional: Negative for fever or weight change.  Respiratory: Negative for cough and shortness of breath.   Cardiovascular: Negative for chest pain or palpitations.  Gastrointestinal: Negative for abdominal pain, no bowel changes.  Musculoskeletal: Negative for gait problem or joint swelling.  Skin: Negative for rash.  Neurological: Negative for dizziness or headache.  No other specific complaints in a complete review of systems (except as listed in HPI above).      Objective:    BP (!) 146/78 (BP Location: Right Arm)   Pulse 92   Temp 98.6 F (37 C) (Oral)   Resp 16   Ht 6' (1.829 m)   Wt 223 lb 11.2 oz (101.5 kg)   SpO2 98%   BMI 30.34 kg/m  Wt Readings from Last 3 Encounters:  03/21/22 223 lb 11.2 oz (101.5 kg)  02/20/22 218 lb 1.6 oz (98.9 kg)  12/13/21 233 lb (105.7 kg)    Physical Exam  Constitutional: Patient appears well-developed and well-nourished. Obese  No distress.  HEENT: head atraumatic, normocephalic, pupils equal and reactive to light, neck supple Cardiovascular: Normal rate, regular rhythm and normal heart sounds.  No murmur heard. No BLE edema. Pulmonary/Chest: Effort normal and breath sounds normal. No respiratory distress. Abdominal: Soft.  There is no tenderness. Psychiatric: Patient has a normal mood and affect. behavior is normal. Judgment and thought content normal.  Results for orders placed or performed in visit on 02/20/22  CBC with Differential/Platelet  Result Value Ref Range    WBC 6.0 3.8 - 10.8 Thousand/uL   RBC 4.46 4.20 - 5.80 Million/uL   Hemoglobin 12.5 (L) 13.2 - 17.1 g/dL   HCT 38.3 (L) 38.5 - 50.0 %   MCV 85.9 80.0 - 100.0 fL   MCH 28.0 27.0 - 33.0 pg   MCHC 32.6 32.0 - 36.0 g/dL   RDW 12.3 11.0 - 15.0 %   Platelets 311 140 - 400 Thousand/uL   MPV 10.3 7.5 - 12.5 fL   Neutro Abs 2,850 1,500 - 7,800 cells/uL   Lymphs Abs 2,424 850 - 3,900 cells/uL   Absolute Monocytes 522 200 - 950 cells/uL   Eosinophils Absolute 162 15 - 500 cells/uL   Basophils Absolute 42 0 - 200 cells/uL   Neutrophils Relative % 47.5 %   Total Lymphocyte 40.4 %   Monocytes Relative 8.7 %   Eosinophils Relative 2.7 %   Basophils Relative 0.7 %  COMPLETE METABOLIC PANEL WITH GFR  Result Value Ref Range   Glucose, Bld 98 65 - 99 mg/dL   BUN 17 7 - 25 mg/dL   Creat 1.55 (H) 0.70 - 1.28 mg/dL   eGFR 46 (L) > OR = 60 mL/min/1.70m   BUN/Creatinine Ratio 11 6 - 22 (calc)   Sodium 137 135 - 146 mmol/L   Potassium 5.0 3.5 - 5.3 mmol/L   Chloride 103 98 - 110 mmol/L   CO2 26 20 - 32 mmol/L   Calcium 10.0 8.6 - 10.3 mg/dL   Total Protein 7.1 6.1 - 8.1 g/dL   Albumin 4.5 3.6 - 5.1 g/dL   Globulin 2.6 1.9 - 3.7 g/dL (calc)   AG Ratio 1.7 1.0 - 2.5 (calc)   Total Bilirubin 0.3 0.2 - 1.2 mg/dL   Alkaline phosphatase (APISO) 60 35 - 144 U/L   AST 15 10 - 35 U/L   ALT 10 9 - 46 U/L  Hemoglobin A1c  Result Value Ref Range   Hgb A1c MFr Bld 5.8 (H) <5.7 % of total Hgb   Mean Plasma Glucose 120 mg/dL   eAG (mmol/L) 6.6 mmol/L      Assessment & Plan:   Problem List Items Addressed This Visit       Cardiovascular and Mediastinum   Hypertension - Primary (Chronic)    Patient is currently taking spironolactone 25 mg daily, olmesartan 40 mg daily, hydrochlorothiazide 12.5 mg daily and hydralazine 10 mg 2 times daily as needed and clonidine 0.2 mg 2 times daily.  We will continue with current treatment plan.      Relevant Medications   cloNIDine (CATAPRES) 0.2 MG tablet    Coronary artery disease involving native coronary artery of native heart    Patient is getting go home and check and see if he  is actually taking his pravastatin.  Patient is prescribed pravastatin 80 mg daily.  But cholesterol LDL was 151 on 10/05/2021.  We will verify that he is taking the medication prior to any changes.      Relevant Medications   cloNIDine (CATAPRES) 0.2 MG tablet   Aortic atherosclerosis ([place])    Patient is getting go home and check and see if he is actually taking his pravastatin.  Patient is prescribed pravastatin 80 mg daily.  But cholesterol LDL was 151 on 10/05/2021.  We will verify that he is taking the medication prior to any changes.      Relevant Medications   cloNIDine (CATAPRES) 0.2 MG tablet     Respiratory   OSA on CPAP    Continue wearing CPAP at night.        Genitourinary   Chronic kidney disease, stage 3 (HCC)    Avoid NSAIDs like ibuprofen, Advil, Aleve.  She is drinking plenty of water.        Other   Hyperlipidemia (Chronic)    Patient is getting go home and check and see if he is actually taking his pravastatin.  Patient is prescribed pravastatin 80 mg daily.  But cholesterol LDL was 151 on 10/05/2021.  We will verify that he is taking the medication prior to any changes.      Relevant Medications   cloNIDine (CATAPRES) 0.2 MG tablet   Numbness of fingers of both hands    Patient has neurology appointment next week.      Dizziness    Patient has neurology appointment next week.        Follow up plan: Return in about 6 months (around 09/21/2022) for follow up.

## 2022-03-22 ENCOUNTER — Ambulatory Visit: Payer: Medicare Other | Admitting: Nurse Practitioner

## 2022-03-28 DIAGNOSIS — R42 Dizziness and giddiness: Secondary | ICD-10-CM | POA: Diagnosis not present

## 2022-03-28 DIAGNOSIS — R200 Anesthesia of skin: Secondary | ICD-10-CM | POA: Diagnosis not present

## 2022-03-28 DIAGNOSIS — R202 Paresthesia of skin: Secondary | ICD-10-CM | POA: Diagnosis not present

## 2022-03-28 DIAGNOSIS — E559 Vitamin D deficiency, unspecified: Secondary | ICD-10-CM | POA: Diagnosis not present

## 2022-04-01 ENCOUNTER — Other Ambulatory Visit: Payer: Self-pay | Admitting: Neurology

## 2022-04-01 DIAGNOSIS — R42 Dizziness and giddiness: Secondary | ICD-10-CM

## 2022-04-02 ENCOUNTER — Ambulatory Visit (INDEPENDENT_AMBULATORY_CARE_PROVIDER_SITE_OTHER): Payer: Medicare Other | Admitting: Nurse Practitioner

## 2022-04-02 ENCOUNTER — Other Ambulatory Visit: Payer: Self-pay

## 2022-04-02 ENCOUNTER — Encounter: Payer: Self-pay | Admitting: Nurse Practitioner

## 2022-04-02 VITALS — BP 130/79 | HR 78 | Temp 98.3°F | Resp 18 | Ht 72.0 in | Wt 229.1 lb

## 2022-04-02 DIAGNOSIS — Z9989 Dependence on other enabling machines and devices: Secondary | ICD-10-CM | POA: Diagnosis not present

## 2022-04-02 DIAGNOSIS — G4733 Obstructive sleep apnea (adult) (pediatric): Secondary | ICD-10-CM | POA: Diagnosis not present

## 2022-04-02 NOTE — Progress Notes (Signed)
BP 130/79   Pulse 78   Temp 98.3 F (36.8 C) (Oral)   Resp 18   Ht 6' (1.829 m)   Wt 229 lb 1.6 oz (103.9 kg)   SpO2 99%   BMI 31.07 kg/m    Subjective:    Patient ID: Jared Cline, male    DOB: 07-30-1946, 76 y.o.   MRN: [id number]  HPI: Jared Cline is a 76 y.o. male  Chief Complaint  Patient presents with   Obstructive Sleep Apnea    Need new Cpap equipment   OSA on CPAP: Patient last sleep study was 08/2008. He needs new cpap machine and new sleep study. Patient states that his last machine has broken.  Patient reports that he tried to get replacement parts and was told they no longer have the correct parts and he will need a new machine. Patient states since his machine has broken he has woken up in the middle of the night with a really dry mouth. He says that he knows he needs his cpap machine. Will place referral to get new sleep study done so he can get his new cpap machine.    Relevant past medical, surgical, family and social history reviewed and updated as indicated. Interim medical history since our last visit reviewed. Allergies and medications reviewed and updated.  Review of Systems  Constitutional: Negative for fever or weight change.  Respiratory: Negative for cough and shortness of breath.   Cardiovascular: Negative for chest pain or palpitations.  Gastrointestinal: Negative for abdominal pain, no bowel changes.  Musculoskeletal: Negative for gait problem or joint swelling.  Skin: Negative for rash.  Neurological: Negative for dizziness or headache.  No other specific complaints in a complete review of systems (except as listed in HPI above).      Objective:    BP 130/79   Pulse 78   Temp 98.3 F (36.8 C) (Oral)   Resp 18   Ht 6' (1.829 m)   Wt 229 lb 1.6 oz (103.9 kg)   SpO2 99%   BMI 31.07 kg/m   Wt Readings from Last 3 Encounters:  04/02/22 229 lb 1.6 oz (103.9 kg)  03/21/22 223 lb 11.2 oz (101.5 kg)  02/20/22 218 lb 1.6 oz (98.9 kg)     Physical Exam  Constitutional: Patient appears well-developed and well-nourished. Obese  No distress.  HEENT: head atraumatic, normocephalic, pupils equal and reactive to light, neck supple Cardiovascular: Normal rate, regular rhythm and normal heart sounds.  No murmur heard. No BLE edema. Pulmonary/Chest: Effort normal and breath sounds normal. No respiratory distress. Abdominal: Soft.  There is no tenderness. Psychiatric: Patient has a normal mood and affect. behavior is normal. Judgment and thought content normal.  Results for orders placed or performed in visit on 02/20/22  CBC with Differential/Platelet  Result Value Ref Range   WBC 6.0 3.8 - 10.8 Thousand/uL   RBC 4.46 4.20 - 5.80 Million/uL   Hemoglobin 12.5 (L) 13.2 - 17.1 g/dL   HCT 38.3 (L) 38.5 - 50.0 %   MCV 85.9 80.0 - 100.0 fL   MCH 28.0 27.0 - 33.0 pg   MCHC 32.6 32.0 - 36.0 g/dL   RDW 12.3 11.0 - 15.0 %   Platelets 311 140 - 400 Thousand/uL   MPV 10.3 7.5 - 12.5 fL   Neutro Abs 2,850 1,500 - 7,800 cells/uL   Lymphs Abs 2,424 850 - 3,900 cells/uL   Absolute Monocytes 522 200 - 950 cells/uL   Eosinophils Absolute  162 15 - 500 cells/uL   Basophils Absolute 42 0 - 200 cells/uL   Neutrophils Relative % 47.5 %   Total Lymphocyte 40.4 %   Monocytes Relative 8.7 %   Eosinophils Relative 2.7 %   Basophils Relative 0.7 %  COMPLETE METABOLIC PANEL WITH GFR  Result Value Ref Range   Glucose, Bld 98 65 - 99 mg/dL   BUN 17 7 - 25 mg/dL   Creat 1.55 (H) 0.70 - 1.28 mg/dL   eGFR 46 (L) > OR = 60 mL/min/1.9m   BUN/Creatinine Ratio 11 6 - 22 (calc)   Sodium 137 135 - 146 mmol/L   Potassium 5.0 3.5 - 5.3 mmol/L   Chloride 103 98 - 110 mmol/L   CO2 26 20 - 32 mmol/L   Calcium 10.0 8.6 - 10.3 mg/dL   Total Protein 7.1 6.1 - 8.1 g/dL   Albumin 4.5 3.6 - 5.1 g/dL   Globulin 2.6 1.9 - 3.7 g/dL (calc)   AG Ratio 1.7 1.0 - 2.5 (calc)   Total Bilirubin 0.3 0.2 - 1.2 mg/dL   Alkaline phosphatase (APISO) 60 35 - 144 U/L   AST  15 10 - 35 U/L   ALT 10 9 - 46 U/L  Hemoglobin A1c  Result Value Ref Range   Hgb A1c MFr Bld 5.8 (H) <5.7 % of total Hgb   Mean Plasma Glucose 120 mg/dL   eAG (mmol/L) 6.6 mmol/L      Assessment & Plan:   Problem List Items Addressed This Visit       Respiratory   OSA on CPAP - Primary    Patient CPAP machine has broken.  Patient's last sleep study was in 2010.  We will place referral for new sleep study and get set up with new machine.      Relevant Orders   Ambulatory referral to Pulmonology     Follow up plan: Return if symptoms worsen or fail to improve.

## 2022-04-02 NOTE — Assessment & Plan Note (Signed)
Patient CPAP machine has broken.  Patient's last sleep study was in 2010.  We will place referral for new sleep study and get set up with new machine.

## 2022-04-17 DIAGNOSIS — Z8546 Personal history of malignant neoplasm of prostate: Secondary | ICD-10-CM | POA: Diagnosis not present

## 2022-04-17 DIAGNOSIS — N1832 Chronic kidney disease, stage 3b: Secondary | ICD-10-CM | POA: Diagnosis not present

## 2022-04-17 DIAGNOSIS — E785 Hyperlipidemia, unspecified: Secondary | ICD-10-CM | POA: Diagnosis not present

## 2022-04-18 ENCOUNTER — Other Ambulatory Visit: Payer: Medicare Other

## 2022-04-30 DIAGNOSIS — R202 Paresthesia of skin: Secondary | ICD-10-CM | POA: Diagnosis not present

## 2022-04-30 DIAGNOSIS — R200 Anesthesia of skin: Secondary | ICD-10-CM | POA: Diagnosis not present

## 2022-05-01 DIAGNOSIS — R202 Paresthesia of skin: Secondary | ICD-10-CM | POA: Diagnosis not present

## 2022-05-15 ENCOUNTER — Telehealth: Payer: Self-pay

## 2022-05-15 NOTE — Telephone Encounter (Signed)
Spoke to patient. He stated that sleep study was years ago and he last wore cpap months ago. He would like repeat sleep study.

## 2022-05-16 ENCOUNTER — Ambulatory Visit (INDEPENDENT_AMBULATORY_CARE_PROVIDER_SITE_OTHER): Payer: Medicare Other | Admitting: Pulmonary Disease

## 2022-05-16 ENCOUNTER — Encounter: Payer: Self-pay | Admitting: Pulmonary Disease

## 2022-05-16 VITALS — BP 134/82 | HR 71 | Temp 97.7°F | Ht 72.0 in | Wt 226.8 lb

## 2022-05-16 DIAGNOSIS — G4733 Obstructive sleep apnea (adult) (pediatric): Secondary | ICD-10-CM

## 2022-05-16 NOTE — Patient Instructions (Signed)
Will try to arrange for new supplies for your old CPAP machine  Will arrange for home sleep study  Will call to arrange for follow up after sleep study reviewed

## 2022-05-16 NOTE — Progress Notes (Signed)
[facility]  Chief Complaint  Patient presents with   Sleep Consult    Referred by Serafina Royals, FNP.  Pt c/o snoring and trouble with breathing at night- CPAP is broken and he has not used in 2-3 months.     Past Surgical History:  He  has a past surgical history that includes Knee arthroscopy (Right); Prostatectomy; Lumbar laminectomy; abdominal ultrasound (Oct 2015); and Colonoscopy.  Past Medical History:  BPH, Prostate cancer, Carpal tunnel, CKD 3a, Diverticulitis, HLD, HTN, IBS, OA, CAD  Constitutional:  BP 134/82 (BP Location: Left Arm, Cuff Size: Normal)   Pulse 71   Temp 97.7 F (36.5 C) (Oral)   Ht 6' (1.829 m)   Wt 226 lb 12.8 oz (102.9 kg)   SpO2 100%   BMI 30.76 kg/m   Brief Summary:  Jared Cline is a 76 y.o. male with obstructive sleep apnea.      Subjective:   He had a sleep study years ago and was found to have sleep apnea.  He was previously using CPAP, but hasn't used this in a few months after his machine broke.  His machine is about 53 to 77 years old.  He needs new filters and supplies.  He is having more trouble with his sleep w/o CPAP.  He is snoring and stops breathing at night.  He is more sleepy and tired during the day.  He goes to sleep at 12 am.  He falls asleep in minutes.  He wakes up 2 to 3 times to use the bathroom.  He gets out of bed at 8 am.  He feels tired in the morning.  He denies morning headache.  He does not use anything to help him fall sleep or stay awake.  He denies sleep walking, sleep talking, bruxism, or nightmares.  There is no history of restless legs.  He denies sleep hallucinations, sleep paralysis, or cataplexy.  The Epworth score is 4 out of 24.   Physical Exam:   Appearance - well kempt   ENMT - no sinus tenderness, no oral exudate, no LAN, Mallampati 3 airway, no stridor, decreased AP diameter of the posterior pharynx  Respiratory - equal breath sounds bilaterally, no  wheezing or rales  CV - s1s2 regular rate and rhythm, no murmurs  Ext - no clubbing, no edema  Skin - no rashes  Psych - normal mood and affect   Sleep Tests:    Cardiac Tests:  Echo 01/23/22 >> EF greater than 55%, mod LVH, mild MR  Social History:  He  reports that he quit smoking about 50 years ago. His smoking use included cigarettes. He has a 4.50 pack-year smoking history. He has been exposed to tobacco smoke. He has never used smokeless tobacco. He reports that he does not drink alcohol and does not use drugs.  Family History:  His family history includes Alcohol abuse in his brother; Aneurysm in his father; Heart disease in his paternal uncle; Hypertension in his father and mother; Stroke in his mother.    Discussion:  He has snoring, sleep disruption, apnea, and daytime sleepiness.  He has history of hypertension and coronary artery disease.  He has prior history of sleep apnea.  I am concerned he still has significant obstructive sleep apnea.  Assessment/Plan:   Snoring with excessive daytime sleepiness with history of obstructive sleep apnea. - will need to arrange for a home sleep study and then likely arrange for new auto  CPAP assuming he still has obstructive sleep apnea - in the meantime will see if his prior DME ([employer]) can arrange for replacement supplies to use for his old CPAP machine while he is waiting for additional sleep testing to be completed  Obesity. - discussed how weight can impact sleep and risk for sleep disordered breathing - discussed options to assist with weight loss: combination of diet modification, cardiovascular and strength training exercises  CKD 3a. - followed by Dr. Lyla Son with [facility]  Coronary artery disease. - followed by Dr. Serafina Royals with [facility] cardiology  Cardiovascular risk. - had an extensive discussion regarding the adverse health consequences related to untreated sleep disordered  breathing - specifically discussed the risks for hypertension, coronary artery disease, cardiac dysrhythmias, cerebrovascular disease, and diabetes - lifestyle modification discussed  Safe driving practices. - discussed how sleep disruption can increase risk of accidents, particularly when driving - safe driving practices were discussed  Therapies for obstructive sleep apnea. - if the sleep study shows significant sleep apnea, then various therapies for treatment were reviewed: CPAP, oral appliance, and surgical interventions   Time Spent Involved in Patient Care on Day of Examination:  35 minutes  Follow up:   Patient Instructions  Will try to arrange for new supplies for your old CPAP machine  Will arrange for home sleep study  Will call to arrange for follow up after sleep study reviewed   Medication List:   Allergies as of 05/16/2022       Reactions   Flomax [tamsulosin Hcl] Shortness Of Breath   Ace Inhibitors Other (See Comments)   Angioedema   Amlodipine Other (See Comments)   Angioedema   Doxazosin    Lipitor [atorvastatin] Other (See Comments)   Memory loss        Medication List        Accurate as of May 16, 2022 10:05 AM. If you have any questions, ask your nurse or doctor.          STOP taking these medications    meloxicam 15 MG tablet Commonly known as: MOBIC Stopped by: Chesley Mires, MD       TAKE these medications    aspirin EC 81 MG tablet Take 81 mg by mouth daily.   cloNIDine 0.2 MG tablet Commonly known as: CATAPRES Take 1 tablet (0.2 mg total) by mouth 2 (two) times daily. TAKE 1 TABLET(0.2 MG) BY MOUTH TWICE DAILY   hydrALAZINE 10 MG tablet Commonly known as: APRESOLINE Take 1 tablet (10 mg total) by mouth 2 (two) times daily as needed. Take if blood pressure greater than 140/90.   hydrochlorothiazide 12.5 MG tablet Commonly known as: HYDRODIURIL TAKE 1 TABLET(12.5 MG) BY MOUTH DAILY   magnesium oxide 400 MG  tablet Commonly known as: MAG-OX Take 400 mg by mouth daily.   meclizine 25 MG tablet Commonly known as: ANTIVERT Take 1 tablet (25 mg total) by mouth 3 (three) times daily as needed for dizziness.   olmesartan 40 MG tablet Commonly known as: BENICAR TAKE 1 TABLET(40 MG) BY MOUTH DAILY   Potassium Gluconate 550 MG Tabs Take 550 mg by mouth daily.   pravastatin 80 MG tablet Commonly known as: PRAVACHOL Take 1 tablet (80 mg total) by mouth at bedtime.   spironolactone 25 MG tablet Commonly known as: ALDACTONE TAKE 1 TABLET(25 MG) BY MOUTH DAILY        Signature:  Chesley Mires, MD [place] Pager - [phone]  05/16/2022, 10:05 AM

## 2022-06-01 ENCOUNTER — Other Ambulatory Visit: Payer: Self-pay | Admitting: Nurse Practitioner

## 2022-06-01 ENCOUNTER — Telehealth: Payer: Self-pay | Admitting: Family Medicine

## 2022-06-01 DIAGNOSIS — I10 Essential (primary) hypertension: Secondary | ICD-10-CM

## 2022-06-03 ENCOUNTER — Other Ambulatory Visit: Payer: Self-pay | Admitting: Emergency Medicine

## 2022-06-03 ENCOUNTER — Other Ambulatory Visit: Payer: Self-pay | Admitting: Nurse Practitioner

## 2022-06-03 NOTE — Telephone Encounter (Signed)
I do not see valsartan on medication list

## 2022-06-03 NOTE — Telephone Encounter (Signed)
Requested Prescriptions  Pending Prescriptions Disp Refills  . hydrochlorothiazide (HYDRODIURIL) 12.5 MG tablet [Pharmacy Med Name: HYDROCHLOROTHIAZIDE 12.'5MG'$  TABLETS] 90 tablet 1    Sig: TAKE 1 TABLET(12.5 MG) BY MOUTH DAILY     Cardiovascular: Diuretics - Thiazide Failed - 06/01/2022  4:41 PM      Failed - Cr in normal range and within 180 days    Creat  Date Value Ref Range Status  02/20/2022 1.55 (H) 0.70 - 1.28 mg/dL Final   Creatinine, Urine  Date Value Ref Range Status  05/02/2021 128 20 - 320 mg/dL Final         Passed - K in normal range and within 180 days    Potassium  Date Value Ref Range Status  02/20/2022 5.0 3.5 - 5.3 mmol/L Final  08/25/2014 4.1 3.5 - 5.1 mmol/L Final         Passed - Na in normal range and within 180 days    Sodium  Date Value Ref Range Status  02/20/2022 137 135 - 146 mmol/L Final  10/19/2015 135 134 - 144 mmol/L Final  08/25/2014 133 (L) 136 - 145 mmol/L Final         Passed - Last BP in normal range    BP Readings from Last 1 Encounters:  05/16/22 134/82         Passed - Valid encounter within last 6 months    Recent Outpatient Visits          2 months ago OSA on CPAP   [place], FNP   2 months ago Primary hypertension   [facility] Bo Merino, FNP   3 months ago Dizziness   [place], FNP   5 months ago Primary hypertension   [place], Dani Gobble, PA-C   7 months ago Primary hypertension   [facility] Bo Merino, FNP      Future Appointments            In 1 month [facility], [facility]

## 2022-06-03 NOTE — Telephone Encounter (Signed)
Medication d/c'd 03/21/2021. Requested Prescriptions  Pending Prescriptions Disp Refills  . valsartan (DIOVAN) 80 MG tablet [Pharmacy Med Name: VALSARTAN '80MG'$  TABLETS] 90 tablet 3    Sig: TAKE 1 TABLET(80 MG) BY MOUTH DAILY     Cardiovascular:  Angiotensin Receptor Blockers Failed - 06/01/2022  4:41 PM      Failed - Cr in normal range and within 180 days    Creat  Date Value Ref Range Status  02/20/2022 1.55 (H) 0.70 - 1.28 mg/dL Final   Creatinine, Urine  Date Value Ref Range Status  05/02/2021 128 20 - 320 mg/dL Final         Passed - K in normal range and within 180 days    Potassium  Date Value Ref Range Status  02/20/2022 5.0 3.5 - 5.3 mmol/L Final  08/25/2014 4.1 3.5 - 5.1 mmol/L Final         Passed - Patient is not pregnant      Passed - Last BP in normal range    BP Readings from Last 1 Encounters:  05/16/22 134/82         Passed - Valid encounter within last 6 months    Recent Outpatient Visits          2 months ago OSA on CPAP   [place], FNP   2 months ago Primary hypertension   [facility] Bo Merino, FNP   3 months ago Dizziness   [place], FNP   5 months ago Primary hypertension   [place], PA-C   7 months ago Primary hypertension   [facility] Bo Merino, FNP      Future Appointments            In 1 month [facility], [facility]

## 2022-06-03 NOTE — Telephone Encounter (Signed)
Patient notified

## 2022-07-06 DIAGNOSIS — I10 Essential (primary) hypertension: Secondary | ICD-10-CM | POA: Diagnosis not present

## 2022-07-06 DIAGNOSIS — I2510 Atherosclerotic heart disease of native coronary artery without angina pectoris: Secondary | ICD-10-CM | POA: Diagnosis not present

## 2022-07-06 DIAGNOSIS — M79661 Pain in right lower leg: Secondary | ICD-10-CM | POA: Diagnosis not present

## 2022-07-06 DIAGNOSIS — E785 Hyperlipidemia, unspecified: Secondary | ICD-10-CM | POA: Diagnosis not present

## 2022-07-06 DIAGNOSIS — M79662 Pain in left lower leg: Secondary | ICD-10-CM | POA: Diagnosis not present

## 2022-07-08 NOTE — Patient Instructions (Signed)
Health Maintenance, Male Adopting a healthy lifestyle and getting preventive care are important in promoting health and wellness. Ask your health care provider about: The right schedule for you to have regular tests and exams. Things you can do on your own to prevent diseases and keep yourself healthy. What should I know about diet, weight, and exercise? Eat a healthy diet  Eat a diet that includes plenty of vegetables, fruits, low-fat dairy products, and lean protein. Do not eat a lot of foods that are high in solid fats, added sugars, or sodium. Maintain a healthy weight Body mass index (BMI) is a measurement that can be used to identify possible weight problems. It estimates body fat based on height and weight. Your health care provider can help determine your BMI and help you achieve or maintain a healthy weight. Get regular exercise Get regular exercise. This is one of the most important things you can do for your health. Most adults should: Exercise for at least 150 minutes each week. The exercise should increase your heart rate and make you sweat (moderate-intensity exercise). Do strengthening exercises at least twice a week. This is in addition to the moderate-intensity exercise. Spend less time sitting. Even light physical activity can be beneficial. Watch cholesterol and blood lipids Have your blood tested for lipids and cholesterol at 77 years of age, then have this test every 5 years. You may need to have your cholesterol levels checked more often if: Your lipid or cholesterol levels are high. You are older than 77 years of age. You are at high risk for heart disease. What should I know about cancer screening? Many types of cancers can be detected early and may often be prevented. Depending on your health history and family history, you may need to have cancer screening at various ages. This may include screening for: Colorectal cancer. Prostate cancer. Skin cancer. Lung  cancer. What should I know about heart disease, diabetes, and high blood pressure? Blood pressure and heart disease High blood pressure causes heart disease and increases the risk of stroke. This is more likely to develop in people who have high blood pressure readings or are overweight. Talk with your health care provider about your target blood pressure readings. Have your blood pressure checked: Every 3-5 years if you are 18-39 years of age. Every year if you are 40 years old or older. If you are between the ages of 65 and 75 and are a current or former smoker, ask your health care provider if you should have a one-time screening for abdominal aortic aneurysm (AAA). Diabetes Have regular diabetes screenings. This checks your fasting blood sugar level. Have the screening done: Once every three years after age 45 if you are at a normal weight and have a low risk for diabetes. More often and at a younger age if you are overweight or have a high risk for diabetes. What should I know about preventing infection? Hepatitis B If you have a higher risk for hepatitis B, you should be screened for this virus. Talk with your health care provider to find out if you are at risk for hepatitis B infection. Hepatitis C Blood testing is recommended for: Everyone born from 1945 through 1965. Anyone with known risk factors for hepatitis C. Sexually transmitted infections (STIs) You should be screened each year for STIs, including gonorrhea and chlamydia, if: You are sexually active and are younger than 77 years of age. You are older than 77 years of age and your   health care provider tells you that you are at risk for this type of infection. Your sexual activity has changed since you were last screened, and you are at increased risk for chlamydia or gonorrhea. Ask your health care provider if you are at risk. Ask your health care provider about whether you are at high risk for HIV. Your health care provider  may recommend a prescription medicine to help prevent HIV infection. If you choose to take medicine to prevent HIV, you should first get tested for HIV. You should then be tested every 3 months for as long as you are taking the medicine. Follow these instructions at home: Alcohol use Do not drink alcohol if your health care provider tells you not to drink. If you drink alcohol: Limit how much you have to 0-2 drinks a day. Know how much alcohol is in your drink. In the [place] one drink equals one 12 oz bottle of beer (355 mL), one 5 oz glass of wine (148 mL), or one 1 oz glass of hard liquor (44 mL). Lifestyle Do not use any products that contain nicotine or tobacco. These products include cigarettes, chewing tobacco, and vaping devices, such as e-cigarettes. If you need help quitting, ask your health care provider. Do not use street drugs. Do not share needles. Ask your health care provider for help if you need support or information about quitting drugs. General instructions Schedule regular health, dental, and eye exams. Stay current with your vaccines. Tell your health care provider if: You often feel depressed. You have ever been abused or do not feel safe at home. Summary Adopting a healthy lifestyle and getting preventive care are important in promoting health and wellness. Follow your health care provider's instructions about healthy diet, exercising, and getting tested or screened for diseases. Follow your health care provider's instructions on monitoring your cholesterol and blood pressure. This information is not intended to replace advice given to you by your health care provider. Make sure you discuss any questions you have with your health care provider. Document Revised: 12/25/2020 Document Reviewed: 12/25/2020 Elsevier Patient Education  2023 [employer]  

## 2022-07-08 NOTE — Progress Notes (Unsigned)
Subjective:   Jared Cline is a 76 y.o. male who presents for Medicare Annual/Subsequent preventive examination.  Review of Systems    Per HPI unless specifically indicated below.  Cardiac Risk Factors include: advanced age (>64mn, >>61women);male gender, CAD, and hypertension.           Objective:    Today's Vitals   07/09/22 1337  PainSc: 9    There is no height or weight on file to calculate BMI.     07/05/2021    1:34 PM 07/04/2020    1:36 PM 07/01/2019    9:00 AM 06/09/2017    8:26 AM 11/04/2016    3:29 PM 03/05/2016    8:32 AM  Advanced Directives  Does Patient Have a Medical Advance Directive? Yes Yes No No No Yes  Type of AParamedicof ASenecaLiving will [place]MullinLiving will    Living will  Copy of HNixonin Chart? No - copy requested No - copy requested    No - copy requested  Would patient like information on creating a medical advance directive?   No - Patient declined       Current Medications (verified) Outpatient Encounter Medications as of 07/09/2022  Medication Sig   aspirin EC 81 MG tablet Take 81 mg by mouth daily.   cloNIDine (CATAPRES) 0.2 MG tablet Take 1 tablet (0.2 mg total) by mouth 2 (two) times daily. TAKE 1 TABLET(0.2 MG) BY MOUTH TWICE DAILY   hydrALAZINE (APRESOLINE) 10 MG tablet Take 1 tablet (10 mg total) by mouth 2 (two) times daily as needed. Take if blood pressure greater than 140/90.   hydrochlorothiazide (HYDRODIURIL) 12.5 MG tablet TAKE 1 TABLET(12.5 MG) BY MOUTH DAILY   magnesium oxide (MAG-OX) 400 MG tablet Take 400 mg by mouth daily.   Potassium Gluconate 550 MG TABS Take 550 mg by mouth daily.   pravastatin (PRAVACHOL) 80 MG tablet Take 1 tablet (80 mg total) by mouth at bedtime.   spironolactone (ALDACTONE) 25 MG tablet TAKE 1 TABLET(25 MG) BY MOUTH DAILY   losartan (COZAAR) 50 MG tablet    meclizine (ANTIVERT) 25 MG tablet Take 1 tablet (25 mg total) by mouth 3  (three) times daily as needed for dizziness. (Patient not taking: Reported on 07/09/2022)   [DISCONTINUED] olmesartan (BENICAR) 40 MG tablet TAKE 1 TABLET(40 MG) BY MOUTH DAILY   No facility-administered encounter medications on file as of 07/09/2022.    Allergies (verified) Flomax [tamsulosin hcl], Ace inhibitors, Amlodipine, Doxazosin, and Lipitor [atorvastatin]   History: Past Medical History:  Diagnosis Date   BPH (benign prostatic hypertrophy)    Bradycardia    evaluated by Dr. KNehemiah Massed  Cancer ([facility]    prostate   Carpal tunnel syndrome, left    left arm   CKD (chronic kidney disease) stage 2, GFR 60-89 ml/min    Diverticulitis    History of prostate cancer    HNP (herniated nucleus pulposus), lumbar    Hyperlipidemia    Hypertension    IBS (irritable bowel syndrome)    Impotence    Multiple lung nodules    nonspecific, largest 566min Jan 2016; scanned March 2017; rescan 3-6 months later   OA (osteoarthritis) of knee    right   Renal mass    evaluated; followed by urologist   Sleep apnea    on CPAP   Torn meniscus    x 2; right knee   Past Surgical History:  Procedure Laterality Date   abdominal ultrasound  Oct 2015   Negative for eval for AAA   COLONOSCOPY     KNEE ARTHROSCOPY Right    x 2   LUMBAR LAMINECTOMY     PROSTATECTOMY     robotic   Family History  Problem Relation Age of Onset   Stroke Mother    Hypertension Mother    Aneurysm Father        cerebral   Hypertension Father    Heart disease Paternal Uncle    Alcohol abuse Brother    Cancer Neg Hx    COPD Neg Hx    Diabetes Neg Hx    Social History   Socioeconomic History   Marital status: Married    Spouse name: Jared Cline   Number of children: 3   Years of education: Not on file   Highest education level: Not on file  Occupational History   Occupation: retired [employer] [place] Use   Smoking status: Former    Packs/day: 0.50    Years: 9.00    Total pack years: 4.50     Types: Cigarettes    Quit date: 08/20/1971    Years since quitting: 50.9    Passive exposure: Past   Smokeless tobacco: Never  Vaping Use   Vaping Use: Never used  Substance and Sexual Activity   Alcohol use: No    Alcohol/week: 0.0 standard drinks of alcohol   Drug use: No   Sexual activity: Not Currently  Other Topics Concern   Not on file  Social History Narrative   Retired from [employer] [place] [place] 2012.   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    Social Determinants of Health   Financial Resource Strain: Low Risk  (07/09/2022)   Overall Financial Resource Strain (CARDIA)    Difficulty of Paying Living Expenses: Not hard at all  Food Insecurity: No Food Insecurity (07/09/2022)   Hunger Vital Sign    Worried About Running Out of Food in the Last Year: Never true    Ran Out of Food in the Last Year: Never true  Transportation Needs: No Transportation Needs (07/09/2022)   PRAPARE - Hydrologist (Medical): No    Lack of Transportation (Non-Medical): No  Physical Activity: Inactive (07/05/2021)   Exercise Vital Sign    Days of Exercise per Week: 0 days    Minutes of Exercise per Session: 0 min  Stress: No Stress Concern Present (07/09/2022)   [place]    Feeling of Stress : Not at all  Social Connections: [place] (07/09/2022)   Social Connection and Isolation Panel [NHANES]    Frequency of Communication with Friends and Family: Three times a week    Frequency of Social Gatherings with Friends and Family: Once a week    Attends Religious Services: More than 4 times per year    Active Member of Clubs or Organizations: Not on file    Attends Music therapist: More than 4 times per year    Marital Status: Married    Tobacco Counseling Counseling given: No   Clinical Intake:  Pre-visit preparation completed: No  Pain : 0-10 Pain  Score: 9  Pain Location: Calf Pain Orientation: Right, Left Pain Descriptors / Indicators: Aching, Cramping, Tightness, Other (Comment) (twisting sensation) Pain Onset: More than a month ago Pain Frequency: Constant     Nutritional Status: BMI  25 -29 Overweight Nutritional Risks: None, Other (Comment) (constant) Diabetes: No  How often do you need to have someone help you when you read instructions, pamphlets, or other written materials from your doctor or pharmacy?: 1 - Never  Diabetic?No  Interpreter Needed?: No  Information entered by :: Donnie Mesa, CMA   Activities of Daily Living    07/09/2022    1:33 PM 04/02/2022   12:47 PM  In your present state of health, do you have any difficulty performing the following activities:  Hearing? 0 0  Vision? [place]   Difficulty concentrating or making decisions? 0 0  Walking or climbing stairs? 1 0  Dressing or bathing? 0 0  Doing errands, shopping? 0 0    Patient Care Team: Bo Merino, FNP as PCP - General (Nurse Practitioner) Corey Skains, MD as Consulting Physician (Cardiology) Lyla Son, MD as Consulting Physician (Nephrology) Billey Co, MD as Consulting Physician (Urology)  Indicate any recent Medical Services you may have received from other than [facility] providers in the past year (date may be approximate).    The pt was seen at [facility] for bilateral calf pain on 07/06/2022.  Assessment:   This is a routine wellness examination for Jared Cline.  Hearing/Vision screen Denies any hearing issues. Denies any vision issues. Annual Eye Exam done by Dr. Ellin Mayhew   Dietary issues and exercise activities discussed: Current Exercise Habits: Structured exercise class, Type of exercise: walking, Time (Minutes): 25, Frequency (Times/Week): 2, Weekly Exercise (Minutes/Week): 50, Intensity: Mild   Goals Addressed   None    Depression Screen    07/09/2022    1:31 PM  04/02/2022   12:47 PM 03/21/2022    9:42 AM 02/20/2022    2:15 PM 12/13/2021   10:38 AM 11/02/2021    1:16 PM 10/05/2021    1:12 PM  PHQ 2/9 Scores  PHQ - 2 Score 0 0 0 0 0 0 0  PHQ- 9 Score   0  0      Fall Risk    07/09/2022    1:31 PM 04/02/2022   12:47 PM 03/21/2022    9:41 AM 02/20/2022    2:15 PM 12/13/2021   10:38 AM  Fall Risk   Falls in the past year? 0 0 1 1 0  Number falls in past yr: 0 0 1 1 0  Injury with Fall? 0 0 0 0 0  Risk for fall due to : No Fall Risks  History of fall(s) History of fall(s) No Fall Risks  Follow up Falls evaluation completed Falls evaluation completed Education provided;Falls prevention discussed Falls evaluation completed Falls prevention discussed    FALL RISK PREVENTION PERTAINING TO THE HOME:  Any stairs in or around the home? Yes  If so, are there any without handrails? No  Home free of loose throw rugs in walkways, pet beds, electrical cords, etc? Yes  Adequate lighting in your home to reduce risk of falls? Yes   ASSISTIVE DEVICES UTILIZED TO PREVENT FALLS:  Life alert? No  Use of a cane, walker or w/c? Yes  Grab bars in the bathroom? No  Shower chair or bench in shower? Yes  Elevated toilet seat or a handicapped toilet? Yes  TIMED UP AND GO:  Was the test performed?  No, virtual telephonic visit  .  Cognitive Function:        07/09/2022    1:35 PM 07/04/2020    1:38 PM  6CIT Screen  What Year? 0 points 0 points  What month? 0 points 0 points  What time? 0 points 0 points  Count back from 20 2 points 0 points  Months in reverse 0 points 2 points  Repeat phrase 4 points 0 points  Total Score 6 points 2 points    Immunizations Immunization History  Administered Date(s) Administered   Fluad Quad(high Dose 65+) 05/25/2019, 07/04/2020, 06/18/2021   Influenza, High Dose Seasonal PF 06/09/2017, 06/11/2018   PFIZER(Purple Top)SARS-COV-2 Vaccination 10/23/2019, 11/13/2019, 06/16/2020   Pneumococcal Conjugate-13 06/09/2017    Pneumococcal Polysaccharide-23 05/21/2011   Td 08/19/2006    TDAP status: Due, Education has been provided regarding the importance of this vaccine. Advised may receive this vaccine at local pharmacy or Health Dept. Aware to provide a copy of the vaccination record if obtained from local pharmacy or Health Dept. Verbalized acceptance and understanding.  Flu Vaccine status: Due, Education has been provided regarding the importance of this vaccine. Advised may receive this vaccine at local pharmacy or Health Dept. Aware to provide a copy of the vaccination record if obtained from local pharmacy or Health Dept. Verbalized acceptance and understanding.  Pneumococcal vaccine status: Up to date  Covid-19 vaccine status: Information provided on how to obtain vaccines.   Qualifies for Shingles Vaccine? Yes   Zostavax completed No   Shingrix Completed?: No.    Education has been provided regarding the importance of this vaccine. Patient has been advised to call insurance company to determine out of pocket expense if they have not yet received this vaccine. Advised may also receive vaccine at local pharmacy or Health Dept. Verbalized acceptance and understanding.  Screening Tests Health Maintenance  Topic Date Due   Zoster Vaccines- Shingrix (1 of 2) Never done   COLONOSCOPY (Pts 45-100yr Insurance coverage will need to be confirmed)  12/24/2020   COVID-19 Vaccine (4 - 2023-24 season) 04/19/2022   INFLUENZA VACCINE  11/17/2022 (Originally 03/19/2022)   Medicare Annual Wellness (AWV)  07/10/2023   Pneumonia Vaccine 76 Years old  Completed   Hepatitis C Screening  Completed   HPV VACCINES  Aged Out    Health Maintenance  Health Maintenance Due  Topic Date Due   Zoster Vaccines- Shingrix (1 of 2) Never done   COLONOSCOPY (Pts 45-48yrInsurance coverage will need to be confirmed)  12/24/2020   COVID-19 Vaccine (4 - 2023-24 season) 04/19/2022    Colorectal cancer screening: Type of screening:  Colonoscopy. Completed 12/25/2015. Repeat every 10 years  Lung Cancer Screening: (Low Dose CT Chest recommended if Age 77-80ears, 30 pack-year currently smoking OR have quit w/in 15years.) does not qualify.   Lung Cancer Screening Referral:   Additional Screening:  Hepatitis C Screening: does qualify; Completed 07/21/2020  Vision Screening: Recommended annual ophthalmology exams for early detection of glaucoma and other disorders of the eye. Is the patient up to date with their annual eye exam?  Yes  Who is the provider or what is the name of the office in which the patient attends annual eye exams? Dr. BeGloriann LoanIf pt is not established with a provider, would they like to be referred to a provider to establish care? No .   Dental Screening: Recommended annual dental exams for proper oral hygiene  Community Resource Referral / Chronic Care Management: CRR required this visit?  No   CCM required this visit?  No      Plan:     I have personally reviewed and noted the  following in the patient's chart:   Medical and social history Use of alcohol, tobacco or illicit drugs  Current medications and supplements including opioid prescriptions. Patient is not currently taking opioid prescriptions. Functional ability and status Nutritional status Physical activity Advanced directives List of other physicians Hospitalizations, surgeries, and ER visits in previous 12 months Vitals Screenings to include cognitive, depression, and falls Referrals and appointments  In addition, I have reviewed and discussed with patient certain preventive protocols, quality metrics, and best practice recommendations. A written personalized care plan for preventive services as well as general preventive health recommendations were provided to patient.     Wilson Singer, [place]   07/09/2022  Nurse Notes: Approximately 30 minute Face -To-Face Medicare Wellness Visit

## 2022-07-09 ENCOUNTER — Ambulatory Visit (INDEPENDENT_AMBULATORY_CARE_PROVIDER_SITE_OTHER): Payer: Medicare Other

## 2022-07-09 DIAGNOSIS — Z0000 Encounter for general adult medical examination without abnormal findings: Secondary | ICD-10-CM

## 2022-07-17 ENCOUNTER — Ambulatory Visit: Payer: Medicare Other

## 2022-07-17 DIAGNOSIS — G4733 Obstructive sleep apnea (adult) (pediatric): Secondary | ICD-10-CM | POA: Diagnosis not present

## 2022-07-18 ENCOUNTER — Telehealth: Payer: Self-pay | Admitting: Pulmonary Disease

## 2022-07-18 ENCOUNTER — Other Ambulatory Visit: Payer: Self-pay

## 2022-07-18 ENCOUNTER — Encounter: Payer: Self-pay | Admitting: Nurse Practitioner

## 2022-07-18 ENCOUNTER — Ambulatory Visit (INDEPENDENT_AMBULATORY_CARE_PROVIDER_SITE_OTHER): Payer: Medicare Other | Admitting: Nurse Practitioner

## 2022-07-18 VITALS — BP 138/76 | HR 92 | Temp 98.4°F | Resp 16 | Ht 72.0 in | Wt 224.5 lb

## 2022-07-18 DIAGNOSIS — I739 Peripheral vascular disease, unspecified: Secondary | ICD-10-CM | POA: Diagnosis not present

## 2022-07-18 DIAGNOSIS — G4733 Obstructive sleep apnea (adult) (pediatric): Secondary | ICD-10-CM

## 2022-07-18 NOTE — Telephone Encounter (Signed)
HST 07/17/22 >> AHI 33.7, SpO2 low 77%   Please let him know his sleep study shows severe obstructive sleep apnea.  Please send an order to [facility] to set up a new Resmed auto CPAP 5 to 15 cm H2O with heated humidity.  He needs an ROV in 4 months.

## 2022-07-18 NOTE — Progress Notes (Signed)
BP 138/76   Pulse 92   Temp 98.4 F (36.9 C) (Oral)   Resp 16   Ht 6' (1.829 m)   Wt 224 lb 8 oz (101.8 kg)   SpO2 98%   BMI 30.45 kg/m    Subjective:    Patient ID: Jared Cline, male    DOB: Feb 18, 1946, 76 y.o.   MRN: [id number]  HPI: Jared Cline is a 76 y.o. male  Chief Complaint  Patient presents with   Follow-up    At [facility]   Leg Pain    Bilateral calf pain on and off for 4 months. Still in pain after ER visit   Er follow up/bilateral calf pain:  patient was seen at [facility] ER on 07/06/2022 for bilateral calf pain.  Chart review of er visit: 76 year old with history as below including hypertension and known arthritis in his bilateral knees here with cramping in his bilateral calfs with extended ambulation. He has some no swelling in either leg and no posterior calf tenderness, so I am reassured that this is not a DVT. He has no symptoms at rest and no overlying skin changes, so unlikely to be cellulitis. Symptoms most consistent with peripheral arterial disease, he does have palpable DP pulses bilaterally. Will obtain BMP and mag to evaluate for electrolyte abnormality that could be contributing to his cramping, if reassuring, anticipate discharge. He does have a primary care doctor in the area that can oversee any ongoing evaluation.  1505 Potassium: 4.2  1505 Magnesium: 2.3  1505 Normal electrolytes. Updated patient with results. Will discharge. He instructed him to contact his primary care doctor to set up outpatient follow-up. Discussed strict return precautions including worsening leg pain, leg swelling, or any other concerns. All questions answered, will discharge.   Patient reports today that he continues to have the pain. He is a previous smoker and also has history of hyperlipidemia. He is currently taking pravastatin 80 mg daily. He says he has not been taking asa every day.   He has previously seen vascular last seen on 05/03/2020.  He was diagnosed with PVD.   Patient reports that they discussed surgery at that visit but he declined at the time. He says it was not really effecting his life at that time but now his pain has gotten worse and it is making his daily activities harder.  Referral placed to vascular.  Relevant past medical, surgical, family and social history reviewed and updated as indicated. Interim medical history since our last visit reviewed. Allergies and medications reviewed and updated.  Review of Systems  Constitutional: Negative for fever or weight change.  Respiratory: Negative for cough and shortness of breath.   Cardiovascular: Negative for chest pain or palpitations.  Gastrointestinal: Negative for abdominal pain, no bowel changes.  Musculoskeletal: Negative for gait problem or joint swelling. Positive for bilateral lower extremity pain Skin: Negative for rash.  Neurological: Negative for dizziness or headache.  No other specific complaints in a complete review of systems (except as listed in HPI above).      Objective:    BP 138/76   Pulse 92   Temp 98.4 F (36.9 C) (Oral)   Resp 16   Ht 6' (1.829 m)   Wt 224 lb 8 oz (101.8 kg)   SpO2 98%   BMI 30.45 kg/m   Wt Readings from Last 3 Encounters:  07/18/22 224 lb 8 oz (101.8 kg)  05/16/22 226 lb 12.8 oz (102.9 kg)  04/02/22 229 lb  1.6 oz (103.9 kg)    Physical Exam  Constitutional: Patient appears well-developed and well-nourished. Obese  No distress.  HEENT: head atraumatic, normocephalic, pupils equal and reactive to light, neck supple, throat within normal limits Cardiovascular: Normal rate, regular rhythm and normal heart sounds.  No murmur heard. No BLE edema. Pulmonary/Chest: Effort normal and breath sounds normal. No respiratory distress. Abdominal: Soft.  There is no tenderness. Lower extremities: no swelling or tenderness Psychiatric: Patient has a normal mood and affect. behavior is normal. Judgment and thought content normal.   Results for orders  placed or performed in visit on 02/20/22  CBC with Differential/Platelet  Result Value Ref Range   WBC 6.0 3.8 - 10.8 Thousand/uL   RBC 4.46 4.20 - 5.80 Million/uL   Hemoglobin 12.5 (L) 13.2 - 17.1 g/dL   HCT 38.3 (L) 38.5 - 50.0 %   MCV 85.9 80.0 - 100.0 fL   MCH 28.0 27.0 - 33.0 pg   MCHC 32.6 32.0 - 36.0 g/dL   RDW 12.3 11.0 - 15.0 %   Platelets 311 140 - 400 Thousand/uL   MPV 10.3 7.5 - 12.5 fL   Neutro Abs 2,850 1,500 - 7,800 cells/uL   Lymphs Abs 2,424 850 - 3,900 cells/uL   Absolute Monocytes 522 200 - 950 cells/uL   Eosinophils Absolute 162 15 - 500 cells/uL   Basophils Absolute 42 0 - 200 cells/uL   Neutrophils Relative % 47.5 %   Total Lymphocyte 40.4 %   Monocytes Relative 8.7 %   Eosinophils Relative 2.7 %   Basophils Relative 0.7 %  COMPLETE METABOLIC PANEL WITH GFR  Result Value Ref Range   Glucose, Bld 98 65 - 99 mg/dL   BUN 17 7 - 25 mg/dL   Creat 1.55 (H) 0.70 - 1.28 mg/dL   eGFR 46 (L) > OR = 60 mL/min/1.45m   BUN/Creatinine Ratio 11 6 - 22 (calc)   Sodium 137 135 - 146 mmol/L   Potassium 5.0 3.5 - 5.3 mmol/L   Chloride 103 98 - 110 mmol/L   CO2 26 20 - 32 mmol/L   Calcium 10.0 8.6 - 10.3 mg/dL   Total Protein 7.1 6.1 - 8.1 g/dL   Albumin 4.5 3.6 - 5.1 g/dL   Globulin 2.6 1.9 - 3.7 g/dL (calc)   AG Ratio 1.7 1.0 - 2.5 (calc)   Total Bilirubin 0.3 0.2 - 1.2 mg/dL   Alkaline phosphatase (APISO) 60 35 - 144 U/L   AST 15 10 - 35 U/L   ALT 10 9 - 46 U/L  Hemoglobin A1c  Result Value Ref Range   Hgb A1c MFr Bld 5.8 (H) <5.7 % of total Hgb   Mean Plasma Glucose 120 mg/dL   eAG (mmol/L) 6.6 mmol/L      Assessment & Plan:   Problem List Items Addressed This Visit       Cardiovascular and Mediastinum   PVD (peripheral vascular disease) (HPeculiar - Primary    Patient has had continued leg pain and recently seen in er.  Patient was previously seen by vascular in 2021.  They discussed surgery at that time but patient did not want it.  Patient states that the  pain is now effecting his daily living. Will place referral to vascular.       Relevant Orders   Ambulatory referral to Vascular Surgery     Follow up plan: Return if symptoms worsen or fail to improve.

## 2022-07-18 NOTE — Assessment & Plan Note (Signed)
Patient has had continued leg pain and recently seen in er.  Patient was previously seen by vascular in 2021.  They discussed surgery at that time but patient did not want it.  Patient states that the pain is now effecting his daily living. Will place referral to vascular.

## 2022-07-19 NOTE — Telephone Encounter (Signed)
ATC patient.  LMTCB. 

## 2022-07-22 NOTE — Telephone Encounter (Signed)
I have notified the patient of his results. I put in the order for the CPAP and a recall for him to be scheduled in 4 months. Nothing further is needed.

## 2022-07-31 DIAGNOSIS — N281 Cyst of kidney, acquired: Secondary | ICD-10-CM | POA: Diagnosis not present

## 2022-07-31 DIAGNOSIS — N1832 Chronic kidney disease, stage 3b: Secondary | ICD-10-CM | POA: Diagnosis not present

## 2022-07-31 DIAGNOSIS — E785 Hyperlipidemia, unspecified: Secondary | ICD-10-CM | POA: Diagnosis not present

## 2022-07-31 DIAGNOSIS — Z8546 Personal history of malignant neoplasm of prostate: Secondary | ICD-10-CM | POA: Diagnosis not present

## 2022-08-01 ENCOUNTER — Telehealth: Payer: Self-pay | Admitting: Pulmonary Disease

## 2022-08-01 NOTE — Telephone Encounter (Signed)
Called Jared Cline back at [employer]. She just want to give [place] an update to let us know that they have approved the auth for a cpap for this patient. And it will be getting handled soon. She states that order was approved today. Nothing further needed

## 2022-08-01 NOTE — Telephone Encounter (Signed)
[employer] called stating they have auth a Cpap for PT. Larence Penning @ [phone] is [employer] if ou have questions

## 2022-08-19 DIAGNOSIS — Z888 Allergy status to other drugs, medicaments and biological substances status: Secondary | ICD-10-CM | POA: Diagnosis not present

## 2022-08-19 DIAGNOSIS — R1111 Vomiting without nausea: Secondary | ICD-10-CM | POA: Diagnosis not present

## 2022-08-19 DIAGNOSIS — R109 Unspecified abdominal pain: Secondary | ICD-10-CM | POA: Diagnosis not present

## 2022-08-19 DIAGNOSIS — N281 Cyst of kidney, acquired: Secondary | ICD-10-CM | POA: Diagnosis not present

## 2022-08-19 DIAGNOSIS — I10 Essential (primary) hypertension: Secondary | ICD-10-CM | POA: Diagnosis not present

## 2022-08-19 DIAGNOSIS — R1030 Lower abdominal pain, unspecified: Secondary | ICD-10-CM | POA: Diagnosis not present

## 2022-08-19 DIAGNOSIS — R110 Nausea: Secondary | ICD-10-CM | POA: Diagnosis not present

## 2022-08-19 DIAGNOSIS — R531 Weakness: Secondary | ICD-10-CM | POA: Diagnosis not present

## 2022-08-19 DIAGNOSIS — R001 Bradycardia, unspecified: Secondary | ICD-10-CM | POA: Diagnosis not present

## 2022-08-19 DIAGNOSIS — R55 Syncope and collapse: Secondary | ICD-10-CM | POA: Diagnosis not present

## 2022-08-19 DIAGNOSIS — K429 Umbilical hernia without obstruction or gangrene: Secondary | ICD-10-CM | POA: Diagnosis not present

## 2022-08-19 DIAGNOSIS — R61 Generalized hyperhidrosis: Secondary | ICD-10-CM | POA: Diagnosis not present

## 2022-08-19 DIAGNOSIS — R42 Dizziness and giddiness: Secondary | ICD-10-CM | POA: Diagnosis not present

## 2022-08-20 DIAGNOSIS — R55 Syncope and collapse: Secondary | ICD-10-CM | POA: Diagnosis not present

## 2022-08-20 DIAGNOSIS — R42 Dizziness and giddiness: Secondary | ICD-10-CM | POA: Diagnosis not present

## 2022-08-20 DIAGNOSIS — I10 Essential (primary) hypertension: Secondary | ICD-10-CM | POA: Diagnosis not present

## 2022-08-20 DIAGNOSIS — R531 Weakness: Secondary | ICD-10-CM | POA: Diagnosis not present

## 2022-08-21 ENCOUNTER — Ambulatory Visit: Payer: Self-pay

## 2022-08-21 DIAGNOSIS — R55 Syncope and collapse: Secondary | ICD-10-CM | POA: Diagnosis not present

## 2022-08-21 NOTE — Telephone Encounter (Addendum)
      Chief Complaint: Dizzy and fell x 2 last night at home. Had been "in hospital in [place]."  Symptoms: Had vomiting last night. "Too weak and dizzy to walk." Frequency: Last night. Pertinent Negatives: Patient denies any injury Disposition: '[x]'$ ED /'[]'$ Urgent Care (no appt availability in office) / '[]'$ Appointment(In office/virtual)/ '[]'$  [facility]/ '[]'$ Home Care/ '[]'$ Refused Recommended Disposition /'[]'$ [place] [facility]/ '[]'$  Follow-up with PCP Additional Notes:   Reason for Disposition  Sounds like a life-threatening emergency to the triager  Answer Assessment - Initial Assessment Questions 1. MECHANISM: "How did the fall happen?"     Dizzy 2. DOMESTIC VIOLENCE AND ELDER ABUSE SCREENING: "Did you fall because someone pushed you or tried to hurt you?" If Yes, ask: "Are you safe now?"     No 3. ONSET: "When did the fall happen?" (e.g., minutes, hours, or days ago)     Last night 4. LOCATION: "What part of the body hit the ground?" (e.g., back, buttocks, head, hips, knees, hands, head, stomach)     Knees 5. INJURY: "Did you hurt (injure) yourself when you fell?" If Yes, ask: "What did you injure? Tell me more about this?" (e.g., body area; type of injury; pain severity)"     No 6. PAIN: "Is there any pain?" If Yes, ask: "How bad is the pain?" (e.g., Scale 1-10; or mild,  moderate, severe)   - NONE (0): No pain   - MILD (1-3): Doesn't interfere with normal activities    - MODERATE (4-7): Interferes with normal activities or awakens from sleep    - SEVERE (8-10): Excruciating pain, unable to do any normal activities      No 7. SIZE: For cuts, bruises, or swelling, ask: "How large is it?" (e.g., inches or centimeters)      No 8. PREGNANCY: "Is there any chance you are pregnant?" "When was your last menstrual period?"     N/a 9. OTHER SYMPTOMS: "Do you have any other symptoms?" (e.g., dizziness, fever, weakness; new onset or worsening).      Dizzy 10. CAUSE: "What do  you think caused the fall (or falling)?" (e.g., tripped, dizzy spell)       Unsure  Protocols used: Falls and [facility]

## 2022-08-21 NOTE — Telephone Encounter (Signed)
Called patient to set up appt and he is going to see if he can get someone to bring him. He will call back

## 2022-08-23 ENCOUNTER — Other Ambulatory Visit: Payer: Self-pay

## 2022-08-23 DIAGNOSIS — Z823 Family history of stroke: Secondary | ICD-10-CM | POA: Diagnosis not present

## 2022-08-23 DIAGNOSIS — N281 Cyst of kidney, acquired: Secondary | ICD-10-CM | POA: Diagnosis not present

## 2022-08-23 DIAGNOSIS — I6502 Occlusion and stenosis of left vertebral artery: Secondary | ICD-10-CM | POA: Diagnosis not present

## 2022-08-23 DIAGNOSIS — K581 Irritable bowel syndrome with constipation: Secondary | ICD-10-CM | POA: Diagnosis not present

## 2022-08-23 DIAGNOSIS — Z7982 Long term (current) use of aspirin: Secondary | ICD-10-CM

## 2022-08-23 DIAGNOSIS — R29700 NIHSS score 0: Secondary | ICD-10-CM | POA: Diagnosis present

## 2022-08-23 DIAGNOSIS — Z87891 Personal history of nicotine dependence: Secondary | ICD-10-CM

## 2022-08-23 DIAGNOSIS — I739 Peripheral vascular disease, unspecified: Secondary | ICD-10-CM | POA: Diagnosis not present

## 2022-08-23 DIAGNOSIS — R911 Solitary pulmonary nodule: Secondary | ICD-10-CM | POA: Diagnosis not present

## 2022-08-23 DIAGNOSIS — Z888 Allergy status to other drugs, medicaments and biological substances status: Secondary | ICD-10-CM | POA: Diagnosis not present

## 2022-08-23 DIAGNOSIS — Z8546 Personal history of malignant neoplasm of prostate: Secondary | ICD-10-CM

## 2022-08-23 DIAGNOSIS — N400 Enlarged prostate without lower urinary tract symptoms: Secondary | ICD-10-CM | POA: Diagnosis present

## 2022-08-23 DIAGNOSIS — Z811 Family history of alcohol abuse and dependence: Secondary | ICD-10-CM

## 2022-08-23 DIAGNOSIS — I63549 Cerebral infarction due to unspecified occlusion or stenosis of unspecified cerebellar artery: Secondary | ICD-10-CM | POA: Diagnosis not present

## 2022-08-23 DIAGNOSIS — E785 Hyperlipidemia, unspecified: Secondary | ICD-10-CM | POA: Diagnosis not present

## 2022-08-23 DIAGNOSIS — I2510 Atherosclerotic heart disease of native coronary artery without angina pectoris: Secondary | ICD-10-CM | POA: Diagnosis not present

## 2022-08-23 DIAGNOSIS — K551 Chronic vascular disorders of intestine: Secondary | ICD-10-CM | POA: Diagnosis not present

## 2022-08-23 DIAGNOSIS — I2699 Other pulmonary embolism without acute cor pulmonale: Secondary | ICD-10-CM | POA: Diagnosis present

## 2022-08-23 DIAGNOSIS — E669 Obesity, unspecified: Secondary | ICD-10-CM | POA: Diagnosis present

## 2022-08-23 DIAGNOSIS — R42 Dizziness and giddiness: Secondary | ICD-10-CM | POA: Diagnosis not present

## 2022-08-23 DIAGNOSIS — M7989 Other specified soft tissue disorders: Secondary | ICD-10-CM | POA: Diagnosis not present

## 2022-08-23 DIAGNOSIS — Z1152 Encounter for screening for COVID-19: Secondary | ICD-10-CM

## 2022-08-23 DIAGNOSIS — N1831 Chronic kidney disease, stage 3a: Secondary | ICD-10-CM | POA: Diagnosis not present

## 2022-08-23 DIAGNOSIS — G4733 Obstructive sleep apnea (adult) (pediatric): Secondary | ICD-10-CM | POA: Diagnosis present

## 2022-08-23 DIAGNOSIS — M1711 Unilateral primary osteoarthritis, right knee: Secondary | ICD-10-CM | POA: Diagnosis not present

## 2022-08-23 DIAGNOSIS — Z8249 Family history of ischemic heart disease and other diseases of the circulatory system: Secondary | ICD-10-CM | POA: Diagnosis not present

## 2022-08-23 DIAGNOSIS — I6389 Other cerebral infarction: Secondary | ICD-10-CM | POA: Diagnosis not present

## 2022-08-23 DIAGNOSIS — I129 Hypertensive chronic kidney disease with stage 1 through stage 4 chronic kidney disease, or unspecified chronic kidney disease: Secondary | ICD-10-CM | POA: Diagnosis present

## 2022-08-23 DIAGNOSIS — Z7901 Long term (current) use of anticoagulants: Secondary | ICD-10-CM

## 2022-08-23 DIAGNOSIS — I10 Essential (primary) hypertension: Secondary | ICD-10-CM | POA: Diagnosis not present

## 2022-08-23 DIAGNOSIS — R531 Weakness: Secondary | ICD-10-CM | POA: Diagnosis not present

## 2022-08-23 DIAGNOSIS — Z79899 Other long term (current) drug therapy: Secondary | ICD-10-CM

## 2022-08-23 DIAGNOSIS — I639 Cerebral infarction, unspecified: Secondary | ICD-10-CM | POA: Diagnosis not present

## 2022-08-23 DIAGNOSIS — R296 Repeated falls: Secondary | ICD-10-CM | POA: Diagnosis not present

## 2022-08-23 DIAGNOSIS — S299XXA Unspecified injury of thorax, initial encounter: Secondary | ICD-10-CM | POA: Diagnosis not present

## 2022-08-23 DIAGNOSIS — I700 Atherosclerosis of aorta: Secondary | ICD-10-CM | POA: Diagnosis not present

## 2022-08-23 DIAGNOSIS — S3991XA Unspecified injury of abdomen, initial encounter: Secondary | ICD-10-CM | POA: Diagnosis not present

## 2022-08-23 DIAGNOSIS — Z6830 Body mass index (BMI) 30.0-30.9, adult: Secondary | ICD-10-CM

## 2022-08-23 DIAGNOSIS — M25461 Effusion, right knee: Secondary | ICD-10-CM | POA: Diagnosis not present

## 2022-08-23 DIAGNOSIS — Z043 Encounter for examination and observation following other accident: Secondary | ICD-10-CM | POA: Diagnosis not present

## 2022-08-23 LAB — CBC WITH DIFFERENTIAL/PLATELET
Abs Immature Granulocytes: 0.05 10*3/uL (ref 0.00–0.07)
Basophils Absolute: 0.1 10*3/uL (ref 0.0–0.1)
Basophils Relative: 1 %
Eosinophils Absolute: 0.1 10*3/uL (ref 0.0–0.5)
Eosinophils Relative: 1 %
HCT: 42.7 % (ref 39.0–52.0)
Hemoglobin: 13.7 g/dL (ref 13.0–17.0)
Immature Granulocytes: 1 %
Lymphocytes Relative: 25 %
Lymphs Abs: 2.5 10*3/uL (ref 0.7–4.0)
MCH: 27.8 pg (ref 26.0–34.0)
MCHC: 32.1 g/dL (ref 30.0–36.0)
MCV: 86.6 fL (ref 80.0–100.0)
Monocytes Absolute: 0.8 10*3/uL (ref 0.1–1.0)
Monocytes Relative: 8 %
Neutro Abs: 6.5 10*3/uL (ref 1.7–7.7)
Neutrophils Relative %: 64 %
Platelets: 342 10*3/uL (ref 150–400)
RBC: 4.93 MIL/uL (ref 4.22–5.81)
RDW: 11.9 % (ref 11.5–15.5)
WBC: 10 10*3/uL (ref 4.0–10.5)
nRBC: 0 % (ref 0.0–0.2)

## 2022-08-23 LAB — BASIC METABOLIC PANEL
Anion gap: 11 (ref 5–15)
BUN: 28 mg/dL — ABNORMAL HIGH (ref 8–23)
CO2: 25 mmol/L (ref 22–32)
Calcium: 9.6 mg/dL (ref 8.9–10.3)
Chloride: 98 mmol/L (ref 98–111)
Creatinine, Ser: 1.47 mg/dL — ABNORMAL HIGH (ref 0.61–1.24)
GFR, Estimated: 49 mL/min — ABNORMAL LOW (ref >60)
Glucose, Bld: 97 mg/dL (ref 70–99)
Potassium: 4.3 mmol/L (ref 3.5–5.1)
Sodium: 134 mmol/L — ABNORMAL LOW (ref 135–145)

## 2022-08-23 NOTE — ED Triage Notes (Signed)
Pt comes via EMS from home with c/o increased HTn and weakness over last few days. Pt has had multiple falls. Pt was on floor when EMs got there. Pt was trying to hold on to recliner and it moved causing him to fall. Pt unable to really raise legs EMs. Arms even with no weakness.  184/80 80 O2-99% CBG_100

## 2022-08-23 NOTE — ED Triage Notes (Signed)
Pt was seen and was at another hospital on Tuesday for similar symptoms. Pt c/o generalized weakness and falling at home.

## 2022-08-24 ENCOUNTER — Encounter: Payer: Self-pay | Admitting: Radiology

## 2022-08-24 ENCOUNTER — Emergency Department: Payer: Medicare Other

## 2022-08-24 ENCOUNTER — Inpatient Hospital Stay
Admission: EM | Admit: 2022-08-24 | Discharge: 2022-08-29 | DRG: 064 | Disposition: A | Discharge status: Rehabilitation Facility | Payer: Medicare Other | Attending: Internal Medicine | Admitting: Internal Medicine

## 2022-08-24 ENCOUNTER — Other Ambulatory Visit: Payer: Self-pay

## 2022-08-24 ENCOUNTER — Observation Stay: Payer: Medicare Other

## 2022-08-24 DIAGNOSIS — N1831 Chronic kidney disease, stage 3a: Secondary | ICD-10-CM | POA: Diagnosis present

## 2022-08-24 DIAGNOSIS — I2699 Other pulmonary embolism without acute cor pulmonale: Secondary | ICD-10-CM | POA: Diagnosis not present

## 2022-08-24 DIAGNOSIS — I2510 Atherosclerotic heart disease of native coronary artery without angina pectoris: Secondary | ICD-10-CM | POA: Diagnosis present

## 2022-08-24 DIAGNOSIS — N281 Cyst of kidney, acquired: Secondary | ICD-10-CM

## 2022-08-24 DIAGNOSIS — R296 Repeated falls: Secondary | ICD-10-CM

## 2022-08-24 DIAGNOSIS — Z9079 Acquired absence of other genital organ(s): Secondary | ICD-10-CM

## 2022-08-24 DIAGNOSIS — G4733 Obstructive sleep apnea (adult) (pediatric): Secondary | ICD-10-CM

## 2022-08-24 DIAGNOSIS — I10 Essential (primary) hypertension: Secondary | ICD-10-CM | POA: Diagnosis present

## 2022-08-24 DIAGNOSIS — I639 Cerebral infarction, unspecified: Secondary | ICD-10-CM | POA: Diagnosis present

## 2022-08-24 DIAGNOSIS — R9389 Abnormal findings on diagnostic imaging of other specified body structures: Secondary | ICD-10-CM

## 2022-08-24 DIAGNOSIS — I739 Peripheral vascular disease, unspecified: Secondary | ICD-10-CM | POA: Diagnosis present

## 2022-08-24 DIAGNOSIS — R42 Dizziness and giddiness: Secondary | ICD-10-CM

## 2022-08-24 DIAGNOSIS — R109 Unspecified abdominal pain: Secondary | ICD-10-CM

## 2022-08-24 LAB — CBC
HCT: 41.8 % (ref 39.0–52.0)
Hemoglobin: 13.1 g/dL (ref 13.0–17.0)
MCH: 27.2 pg (ref 26.0–34.0)
MCHC: 31.3 g/dL (ref 30.0–36.0)
MCV: 86.9 fL (ref 80.0–100.0)
Platelets: 296 10*3/uL (ref 150–400)
RBC: 4.81 MIL/uL (ref 4.22–5.81)
RDW: 11.8 % (ref 11.5–15.5)
WBC: 8.3 10*3/uL (ref 4.0–10.5)
nRBC: 0 % (ref 0.0–0.2)

## 2022-08-24 LAB — HEPATIC FUNCTION PANEL
ALT: 9 U/L (ref 0–44)
AST: 17 U/L (ref 15–41)
Albumin: 3.9 g/dL (ref 3.5–5.0)
Alkaline Phosphatase: 50 U/L (ref 38–126)
Bilirubin, Direct: 0.3 mg/dL — ABNORMAL HIGH (ref 0.0–0.2)
Indirect Bilirubin: 0.6 mg/dL (ref 0.3–0.9)
Total Bilirubin: 0.9 mg/dL (ref 0.3–1.2)
Total Protein: 7.2 g/dL (ref 6.5–8.1)

## 2022-08-24 LAB — LIPID PANEL
Cholesterol: 138 mg/dL (ref 0–200)
HDL: 44 mg/dL (ref >40)
LDL Cholesterol: 81 mg/dL (ref 0–99)
Total CHOL/HDL Ratio: 3.1 RATIO
Triglycerides: 65 mg/dL (ref <150)
VLDL: 13 mg/dL (ref 0–40)

## 2022-08-24 LAB — URINALYSIS, ROUTINE W REFLEX MICROSCOPIC
Bilirubin Urine: NEGATIVE
Glucose, UA: NEGATIVE mg/dL
Hgb urine dipstick: NEGATIVE
Ketones, ur: NEGATIVE mg/dL
Leukocytes,Ua: NEGATIVE
Nitrite: NEGATIVE
Protein, ur: NEGATIVE mg/dL
Specific Gravity, Urine: 1.018 (ref 1.005–1.030)
pH: 5 (ref 5.0–8.0)

## 2022-08-24 LAB — RESP PANEL BY RT-PCR (RSV, FLU A&B, COVID)  RVPGX2
Influenza A by PCR: NEGATIVE
Influenza B by PCR: NEGATIVE
Resp Syncytial Virus by PCR: NEGATIVE
SARS Coronavirus 2 by RT PCR: NEGATIVE

## 2022-08-24 LAB — CREATININE, SERUM
Creatinine, Ser: 1.53 mg/dL — ABNORMAL HIGH (ref 0.61–1.24)
GFR, Estimated: 47 mL/min — ABNORMAL LOW (ref >60)

## 2022-08-24 LAB — LIPASE, BLOOD: Lipase: 28 U/L (ref 11–51)

## 2022-08-24 LAB — D-DIMER, QUANTITATIVE: D-Dimer, Quant: 9.18 ug/mL-FEU — ABNORMAL HIGH (ref 0.00–0.50)

## 2022-08-24 LAB — TROPONIN I (HIGH SENSITIVITY): Troponin I (High Sensitivity): 5 ng/L (ref <18)

## 2022-08-24 MED ORDER — IOHEXOL 300 MG/ML  SOLN
100.0000 mL | Freq: Once | INTRAMUSCULAR | Status: AC | PRN
  Administered 2022-08-24: 100 mL via INTRAVENOUS

## 2022-08-24 MED ORDER — ENOXAPARIN SODIUM 60 MG/0.6ML IJ SOSY
0.5000 mg/kg | PREFILLED_SYRINGE | INTRAMUSCULAR | Status: DC
  Administered 2022-08-24 – 2022-08-25 (×2): 52.5 mg via SUBCUTANEOUS
  Filled 2022-08-24 (×2): qty 0.6

## 2022-08-24 MED ORDER — IPRATROPIUM-ALBUTEROL 0.5-2.5 (3) MG/3ML IN SOLN: 3.0000 mL | RESPIRATORY_TRACT | Status: DC | PRN

## 2022-08-24 MED ORDER — OXYCODONE HCL 5 MG PO TABS
5.0000 mg | ORAL_TABLET | ORAL | Status: DC | PRN
  Administered 2022-08-27 – 2022-08-29 (×2): 5 mg via ORAL
  Filled 2022-08-24 (×2): qty 1

## 2022-08-24 MED ORDER — PRAVASTATIN SODIUM 20 MG PO TABS
80.0000 mg | ORAL_TABLET | Freq: Every day | ORAL | Status: DC
  Administered 2022-08-25 – 2022-08-28 (×4): 80 mg via ORAL
  Filled 2022-08-24 (×4): qty 4

## 2022-08-24 MED ORDER — METOPROLOL TARTRATE 5 MG/5ML IV SOLN: 5.0000 mg | INTRAVENOUS | Status: DC | PRN

## 2022-08-24 MED ORDER — SODIUM CHLORIDE 0.9 % IV SOLN: INTRAVENOUS | Status: AC

## 2022-08-24 MED ORDER — ACETAMINOPHEN 650 MG RE SUPP: 650.0000 mg | Freq: Four times a day (QID) | RECTAL | Status: DC | PRN

## 2022-08-24 MED ORDER — CLOPIDOGREL BISULFATE 75 MG PO TABS
75.0000 mg | ORAL_TABLET | Freq: Every day | ORAL | Status: DC
  Administered 2022-08-24 – 2022-08-25 (×2): 75 mg via ORAL
  Filled 2022-08-24 (×2): qty 1

## 2022-08-24 MED ORDER — ONDANSETRON HCL 4 MG/2ML IJ SOLN: 4.0000 mg | Freq: Four times a day (QID) | INTRAMUSCULAR | Status: DC | PRN

## 2022-08-24 MED ORDER — ACETAMINOPHEN 325 MG PO TABS
650.0000 mg | ORAL_TABLET | ORAL | Status: DC | PRN
  Administered 2022-08-24 – 2022-08-29 (×4): 650 mg via ORAL
  Filled 2022-08-24 (×4): qty 2

## 2022-08-24 MED ORDER — ACETAMINOPHEN 160 MG/5ML PO SOLN: 650.0000 mg | ORAL | Status: DC | PRN

## 2022-08-24 MED ORDER — SENNOSIDES-DOCUSATE SODIUM 8.6-50 MG PO TABS: 1.0000 | ORAL_TABLET | Freq: Every evening | ORAL | Status: DC | PRN

## 2022-08-24 MED ORDER — ACETAMINOPHEN 325 MG PO TABS: 650.0000 mg | ORAL_TABLET | Freq: Four times a day (QID) | ORAL | Status: DC | PRN

## 2022-08-24 MED ORDER — ONDANSETRON HCL 4 MG PO TABS: 4.0000 mg | ORAL_TABLET | Freq: Four times a day (QID) | ORAL | Status: DC | PRN

## 2022-08-24 MED ORDER — TRAZODONE HCL 50 MG PO TABS: 50.0000 mg | ORAL_TABLET | Freq: Every evening | ORAL | Status: DC | PRN

## 2022-08-24 MED ORDER — ASPIRIN 81 MG PO TBEC
81.0000 mg | DELAYED_RELEASE_TABLET | Freq: Every day | ORAL | Status: DC
  Administered 2022-08-24 – 2022-08-25 (×2): 81 mg via ORAL
  Filled 2022-08-24 (×2): qty 1

## 2022-08-24 MED ORDER — STROKE: EARLY STAGES OF RECOVERY BOOK: Freq: Once | Status: AC

## 2022-08-24 MED ORDER — SODIUM CHLORIDE 0.9 % IV BOLUS (SEPSIS)
500.0000 mL | Freq: Once | INTRAVENOUS | Status: AC
  Administered 2022-08-24: 500 mL via INTRAVENOUS

## 2022-08-24 MED ORDER — MECLIZINE HCL 25 MG PO TABS
25.0000 mg | ORAL_TABLET | Freq: Once | ORAL | Status: AC
  Administered 2022-08-24: 25 mg via ORAL
  Filled 2022-08-24: qty 1

## 2022-08-24 MED ORDER — ACETAMINOPHEN 650 MG RE SUPP: 650.0000 mg | RECTAL | Status: DC | PRN

## 2022-08-24 MED ORDER — ONDANSETRON HCL 4 MG/2ML IJ SOLN
4.0000 mg | Freq: Once | INTRAMUSCULAR | Status: AC
  Administered 2022-08-24: 4 mg via INTRAVENOUS
  Filled 2022-08-24: qty 2

## 2022-08-24 MED ORDER — GUAIFENESIN 100 MG/5ML PO LIQD: 5.0000 mL | ORAL | Status: DC | PRN

## 2022-08-24 NOTE — Assessment & Plan Note (Signed)
No acute issues.

## 2022-08-24 NOTE — Assessment & Plan Note (Signed)
CT chest abdomen and pelvis showed no evidence of injury to chest or abdomen Blood work unrevealing Lipase and LFTs WNL

## 2022-08-24 NOTE — Progress Notes (Signed)
OT Cancellation Note  Patient Details Name: Mart Colpitts MRN: [id number] DOB: Jan 05, 1946   Cancelled Treatment:    Reason Eval/Treat Not Completed: Other (comment);Medical issues which prohibited therapy (per MD, hold for today. Current work up includes work up for [employer]. OT will reattempt as able and medically appropriate.)  Shanon Payor, OTD OTR/L  08/24/22, 8:59 AM

## 2022-08-24 NOTE — ED Notes (Signed)
Pt A&Ox4. Pt vitals currently stable. Eating lunch with son at bedside.

## 2022-08-24 NOTE — ED Notes (Signed)
Pt in imaging

## 2022-08-24 NOTE — ED Provider Notes (Signed)
[facility] Provider Note    Event Date/Time   First MD Initiated Contact with Patient 08/24/22 [phone]     (approximate)   History   Dizziness, Fall, and Weakness   HPI  Jared Cline is a 77 y.o. male with history of prostate cancer, chronic kidney disease, hypertension, hyperlipidemia who presents to the emergency department with family for concerns for several days of vertigo with nausea and vomiting, frequent falls, feeling weak all over.  Also complaining of left-sided rib pain from one of his falls.  No difficulty breathing.States he is not sure if he hit his head.  He has not on blood thinners.  No neck or back pain.  No numbness, tingling or focal weakness.  Also complaining of generalized abdominal pain.  No diarrhea.  Seen at OSH Tuesday Jan 2nd for the same.  History provided by patient and family.    Past Medical History:  Diagnosis Date   BPH (benign prostatic hypertrophy)    Bradycardia    evaluated by Dr. Nehemiah Massed   Cancer [facility])    prostate   Carpal tunnel syndrome, left    left arm   CKD (chronic kidney disease) stage 2, GFR 60-89 ml/min    Diverticulitis    History of prostate cancer    HNP (herniated nucleus pulposus), lumbar    Hyperlipidemia    Hypertension    IBS (irritable bowel syndrome)    Impotence    Multiple lung nodules    nonspecific, largest 47m in Jan 2016; scanned March 2017; rescan 3-6 months later   OA (osteoarthritis) of knee    right   Renal mass    evaluated; followed by urologist   Sleep apnea    on CPAP   Torn meniscus    x 2; right knee    Past Surgical History:  Procedure Laterality Date   abdominal ultrasound  Oct 2015   Negative for eval for AAA   COLONOSCOPY     KNEE ARTHROSCOPY Right    x 2   LUMBAR LAMINECTOMY     PROSTATECTOMY     robotic    MEDICATIONS:  Prior to Admission medications   Medication Sig Start Date End Date Taking? Authorizing Provider  aspirin EC 81 MG tablet Take 81  mg by mouth daily.    [provider]  cloNIDine (CATAPRES) 0.2 MG tablet Take 1 tablet (0.2 mg total) by mouth 2 (two) times daily. TAKE 1 TABLET(0.2 MG) BY MOUTH TWICE DAILY 03/21/22   PBo Merino FNP  hydrALAZINE (APRESOLINE) 10 MG tablet Take 1 tablet (10 mg total) by mouth 2 (two) times daily as needed. Take if blood pressure greater than 140/90. 11/02/21   PBo Merino FNP  hydrochlorothiazide (HYDRODIURIL) 12.5 MG tablet TAKE 1 TABLET(12.5 MG) BY MOUTH DAILY 06/03/22   PBo Merino FNP  losartan (COZAAR) 50 MG tablet     [provider]  magnesium oxide (MAG-OX) 400 MG tablet Take 400 mg by mouth daily.    [provider]  Potassium Gluconate 550 MG TABS Take 550 mg by mouth daily.    [provider]  pravastatin (PRAVACHOL) 80 MG tablet Take 1 tablet (80 mg total) by mouth at bedtime. 10/05/21   PBo Merino FNP  spironolactone (ALDACTONE) 25 MG tablet TAKE 1 TABLET(25 MG) BY MOUTH DAILY 10/05/21   PBo Merino FNP    Physical Exam   Triage Vital Signs: ED Triage Vitals  Enc Vitals Group  BP 08/23/22 2005 (!) 145/66     Pulse Rate 08/23/22 2005 74     Resp 08/23/22 2005 18     Temp 08/23/22 2005 98.4 F (36.9 C)     Temp Source 08/23/22 2005 Oral     SpO2 08/23/22 2005 100 %     Weight --      Height --      Head Circumference --      Peak Flow --      Pain Score 08/23/22 2010 0     Pain Loc --      Pain Edu? --      Excl. in [place]? --     Most recent vital signs: Vitals:   08/24/22 0035 08/24/22 0420  BP: (!) 144/66 (!) 142/63  Pulse: 74 76  Resp: 18 15  Temp: 98.1 F (36.7 C) 98.4 F (36.9 C)  SpO2: 99% 98%     CONSTITUTIONAL: Alert and oriented and responds appropriately to questions. Well-appearing; well-nourished; GCS 15 HEAD: Normocephalic; atraumatic EYES: Conjunctivae clear, PERRL, EOMI ENT: normal nose; no rhinorrhea; moist mucous membranes; pharynx without lesions noted; no dental injury; no septal  hematoma, no epistaxis; no facial deformity or bony tenderness NECK: Supple, no midline spinal tenderness, step-off or deformity; trachea midline CARD: RRR; S1 and S2 appreciated; no murmurs, no clicks, no rubs, no gallops RESP: Normal chest excursion without splinting or tachypnea; breath sounds clear and equal bilaterally; no wheezes, no rhonchi, no rales; no hypoxia or respiratory distress CHEST:  chest wall stable, no crepitus or ecchymosis or deformity, tender over the left chest wall ABD/GI: Normal bowel sounds; non-distended; soft, tender to palpation diffusely without guarding or rebound PELVIS:  stable, nontender to palpation BACK:  The back appears normal; no midline spinal tenderness, step-off or deformity EXT: Normal ROM in all joints; non-tender to palpation; no edema; normal capillary refill; no cyanosis, no bony tenderness or bony deformity of patient's extremities, no joint effusion, compartments are soft, extremities are warm and well-perfused, no ecchymosis SKIN: Normal color for age and race; warm NEURO: No facial asymmetry, normal speech, moving all extremities equally normal sensation diffusely, cranial nerves II through XII intact  ED Results / Procedures / Treatments   LABS: (all labs ordered are listed, but only abnormal results are displayed) Labs Reviewed  BASIC METABOLIC PANEL - Abnormal; Notable for the following components:      Result Value   Sodium 134 (*)    BUN 28 (*)    Creatinine, Ser 1.47 (*)    GFR, Estimated 49 (*)    All other components within normal limits  URINALYSIS, ROUTINE W REFLEX MICROSCOPIC - Abnormal; Notable for the following components:   Color, Urine YELLOW (*)    APPearance CLEAR (*)    All other components within normal limits  HEPATIC FUNCTION PANEL - Abnormal; Notable for the following components:   Bilirubin, Direct 0.3 (*)    All other components within normal limits  RESP PANEL BY RT-PCR (RSV, FLU A&B, COVID)  [id number]  CBC WITH  DIFFERENTIAL/PLATELET  LIPASE, BLOOD  D-DIMER, QUANTITATIVE  HEMOGLOBIN A1C  CBC  CREATININE, SERUM  TROPONIN I (HIGH SENSITIVITY)     EKG:  EKG Interpretation  Date/Time:  Friday August 23 2022 20:16:31 EST Ventricular Rate:  71 PR Interval:  214 QRS Duration: 92 QT Interval:  402 QTC Calculation: 436 R Axis:   -35 Text Interpretation: Sinus rhythm with 1st degree A-V block Left axis deviation Left ventricular hypertrophy (  R in aVL , Cornell product , Romhilt-Estes ) Nonspecific ST and T wave abnormality Abnormal ECG No previous ECGs available Confirmed by Pryor Curia [phone]) on 08/24/2022 3:33:10 AM          RADIOLOGY: My personal review and interpretation of imaging: CT head and cervical spine unremarkable.  MRI shows acute CVA.  CT scan showed no traumatic injury but concerning for possible right-sided PE.  I have personally reviewed all radiology reports. CT CHEST ABDOMEN PELVIS W CONTRAST  Result Date: 08/24/2022 CLINICAL DATA:  Blunt trauma due to multiple falls over the past few days. Found on floor. Generalized weakness EXAM: CT CHEST, ABDOMEN, AND PELVIS WITH CONTRAST TECHNIQUE: Multidetector CT imaging of the chest, abdomen and pelvis was performed following the standard protocol during bolus administration of intravenous contrast. RADIATION DOSE REDUCTION: This exam was performed according to the departmental dose-optimization program which includes automated exposure control, adjustment of the mA and/or kV according to patient size and/or use of iterative reconstruction technique. CONTRAST:  165m OMNIPAQUE IOHEXOL 300 MG/ML  SOLN COMPARISON:  04/06/2018 chest CT FINDINGS: CT CHEST FINDINGS Cardiovascular: Normal heart size. No pericardial effusion. Atheromatous calcification of the aorta and coronaries. No traumatic arterial finding. Concern for segmental filling defect within right upper lobe pulmonary artery, seen on source and reformatted images. Direct  communication of recommended PE study. Mediastinum/Nodes: No hemothorax, pneumothorax, or pulmonary contusion. Lungs/Pleura: 5 mm pulmonary nodule in the right lung on 4:56 is stable from 2019 and considered benign. Musculoskeletal: Spondylosis with multi-level bridging osteophyte. CT ABDOMEN PELVIS FINDINGS Hepatobiliary: No hepatic injury or perihepatic hematoma. Gallbladder is unremarkable. Pancreas: Negative Spleen: No splenic injury or perisplenic hematoma. Adrenals/Urinary Tract: No adrenal hemorrhage or renal injury identified. Bladder is full. Bilateral renal lesions of various complexity including solid and enhancing appearing lesion at the lower pole right kidney measuring 14 mm, not significantly changed from prior MRI of the abdomen 07/09/2021 Stomach/Bowel: No visible injury Vascular/Lymphatic: Atherosclerosis with high-grade narrowing at the celiac origin, SMA patency best seen on sagittal reformats. Reproductive: No acute finding Other: No ascites or pneumoperitoneum Musculoskeletal: Generalized lumbar spine degeneration. No evidence of fracture or traumatic malalignment. IMPRESSION: 1. No evidence of injury to the chest or abdomen. 2. Equivocal for segmental pulmonary embolism in the right upper lobe, enough concern to recommend follow-up PE study when renal function permits. 3. Multiple complicated renal cysts and solid right renal lesion, reference abdominal MRI with follow-up recommendations 07/09/2021 4. Atherosclerosis including the coronary arteries. Electronically Signed   By: JJorje GuildM.D.   On: 08/24/2022 04:25   MR BRAIN WO CONTRAST  Result Date: 08/24/2022 CLINICAL DATA:  Hypertension weakness, multiple falls EXAM: MRI HEAD WITHOUT CONTRAST TECHNIQUE: Multiplanar, multiecho pulse sequences of the brain and surrounding structures were obtained without intravenous contrast. COMPARISON:  No prior MRI available, correlation is made with CT head 08/24/2022 FINDINGS: Brain: Punctate  focus of restricted diffusion with likely ADC correlate in the posteroinferior pons/right cerebellar peduncle (series 9, image 12). This area is associated with mildly increased T2 hyperintense signal. No acute hemorrhage, mass, mass effect, or midline shift. No hydrocephalus or extra-axial collection.No hemosiderin deposition to suggest remote hemorrhage.Normal pituitary and craniocervical junction.Scattered T2 hyperintense signal in the periventricular white matter and pons, likely the sequela of mild-to-moderate chronic small vessel ischemic disease. Vascular: Patent arterial flow voids. Skull and upper cervical spine: Normal marrow signal. Sinuses/Orbits: Clear paranasal sinuses.No acute finding in the orbits. Other: The mastoid air cells are well aerated. IMPRESSION: Punctate acute  infarct in the posteroinferior pons/right cerebellar peduncle. These results were called by telephone at the time of interpretation on 08/24/2022 at 3:45 am to provider [facility] , who verbally acknowledged these results. Electronically Signed   By: Merilyn Baba M.D.   On: 08/24/2022 03:46   CT HEAD WO CONTRAST (5MM)  Result Date: 08/24/2022 CLINICAL DATA:  Hypertension, weakness, multiple falls EXAM: CT HEAD WITHOUT CONTRAST CT CERVICAL SPINE WITHOUT CONTRAST TECHNIQUE: Multidetector CT imaging of the head and cervical spine was performed following the standard protocol without intravenous contrast. Multiplanar CT image reconstructions of the cervical spine were also generated. RADIATION DOSE REDUCTION: This exam was performed according to the departmental dose-optimization program which includes automated exposure control, adjustment of the mA and/or kV according to patient size and/or use of iterative reconstruction technique. COMPARISON:  None Available. FINDINGS: CT HEAD FINDINGS Brain: No evidence of acute infarct, hemorrhage, mass, mass effect, or midline shift. No hydrocephalus or extra-axial fluid collection. Cerebral  volume is within normal limits for age. Periventricular white matter changes, likely the sequela of chronic small vessel ischemic disease. Vascular: No hyperdense vessel. Skull: Normal. Negative for fracture or focal lesion. Sinuses/Orbits: No acute finding. Other: The mastoid air cells are well aerated. CT CERVICAL SPINE FINDINGS Alignment: Straightening of the normal cervical lordosis. 2 mm anterolisthesis of C7 on T1, which appears facet mediated. No traumatic listhesis. Skull base and vertebrae: No acute fracture. No primary bone lesion or focal pathologic process. Soft tissues and spinal canal: No prevertebral fluid or swelling. No visible canal hematoma. Disc levels: Multilevel degenerative changes, superimposed on congenitally short pedicles, which narrow the AP diameter of the spinal canal. Disc height loss, most prominent C5-C6 and C6-C7. Mild spinal canal stenosis at C3-C4, C5-C6, and C6-C7. Multilevel uncovertebral and facet arthropathy causes moderate or severe neural foraminal narrowing C3-C7. Upper chest: No focal pulmonary opacity or pleural effusion. Other: Negative. IMPRESSION: 1. No acute intracranial process. 2. No acute fracture or traumatic listhesis in the cervical spine. Electronically Signed   By: Merilyn Baba M.D.   On: 08/24/2022 02:16   CT Cervical Spine Wo Contrast  Result Date: 08/24/2022 CLINICAL DATA:  Hypertension, weakness, multiple falls EXAM: CT HEAD WITHOUT CONTRAST CT CERVICAL SPINE WITHOUT CONTRAST TECHNIQUE: Multidetector CT imaging of the head and cervical spine was performed following the standard protocol without intravenous contrast. Multiplanar CT image reconstructions of the cervical spine were also generated. RADIATION DOSE REDUCTION: This exam was performed according to the departmental dose-optimization program which includes automated exposure control, adjustment of the mA and/or kV according to patient size and/or use of iterative reconstruction technique.  COMPARISON:  None Available. FINDINGS: CT HEAD FINDINGS Brain: No evidence of acute infarct, hemorrhage, mass, mass effect, or midline shift. No hydrocephalus or extra-axial fluid collection. Cerebral volume is within normal limits for age. Periventricular white matter changes, likely the sequela of chronic small vessel ischemic disease. Vascular: No hyperdense vessel. Skull: Normal. Negative for fracture or focal lesion. Sinuses/Orbits: No acute finding. Other: The mastoid air cells are well aerated. CT CERVICAL SPINE FINDINGS Alignment: Straightening of the normal cervical lordosis. 2 mm anterolisthesis of C7 on T1, which appears facet mediated. No traumatic listhesis. Skull base and vertebrae: No acute fracture. No primary bone lesion or focal pathologic process. Soft tissues and spinal canal: No prevertebral fluid or swelling. No visible canal hematoma. Disc levels: Multilevel degenerative changes, superimposed on congenitally short pedicles, which narrow the AP diameter of the spinal canal. Disc height loss, most prominent  C5-C6 and C6-C7. Mild spinal canal stenosis at C3-C4, C5-C6, and C6-C7. Multilevel uncovertebral and facet arthropathy causes moderate or severe neural foraminal narrowing C3-C7. Upper chest: No focal pulmonary opacity or pleural effusion. Other: Negative. IMPRESSION: 1. No acute intracranial process. 2. No acute fracture or traumatic listhesis in the cervical spine. Electronically Signed   By: Merilyn Baba M.D.   On: 08/24/2022 02:16     PROCEDURES:  Critical Care performed: No      Procedures    IMPRESSION / MDM / ASSESSMENT AND PLAN / ED COURSE  I reviewed the triage vital signs and the nursing notes.  Patient here with vertigo, generalized weakness, frequent falls, left-sided chest pain from one of his falls and generalized abdominal pain.     DIFFERENTIAL DIAGNOSIS (includes but not limited to):   Peripheral vertigo, CVA, intracranial hemorrhage, rib fracture,  less likely ACS, PE or dissection.  Differential also includes anemia, electrolyte derangement, UTI, dehydration.  Patient's presentation is most consistent with acute presentation with potential threat to life or bodily function.  PLAN: Patient's labs obtained from triage.  No leukocytosis, normal hemoglobin.  Stable chronic kidney disease.  Normal electrolytes.  Will obtain urinalysis, troponin, LFTs, lipase, CT of the chest, abdomen and pelvis, MRI of the brain without contrast.  Will give IV fluids, Zofran and meclizine for symptomatic relief.   MEDICATIONS GIVEN IN ED: Medications  clopidogrel (PLAVIX) tablet 75 mg (has no administration in time range)  aspirin EC tablet 81 mg (has no administration in time range)  pravastatin (PRAVACHOL) tablet 80 mg (has no administration in time range)   stroke: early stages of recovery book (has no administration in time range)  acetaminophen (TYLENOL) tablet 650 mg (has no administration in time range)    Or  acetaminophen (TYLENOL) 160 MG/5ML solution 650 mg (has no administration in time range)    Or  acetaminophen (TYLENOL) suppository 650 mg (has no administration in time range)  enoxaparin (LOVENOX) injection 40 mg (has no administration in time range)  ondansetron (ZOFRAN) tablet 4 mg (has no administration in time range)    Or  ondansetron (ZOFRAN) injection 4 mg (has no administration in time range)  sodium chloride 0.9 % bolus 500 mL (500 mLs Intravenous New Bag/Given 08/24/22 0352)  ondansetron (ZOFRAN) injection 4 mg (4 mg Intravenous Given 08/24/22 0350)  meclizine (ANTIVERT) tablet 25 mg (25 mg Oral Given 08/24/22 0350)  iohexol (OMNIPAQUE) 300 MG/ML solution 100 mL (100 mLs Intravenous Contrast Given 08/24/22 0402)     ED COURSE: MRI reviewed and interpreted by myself and the radiologist and shows an acute stroke in the cerebellum.  Not a tPA candidate due to symptoms ongoing for several days.  CT scans reviewed and interpreted by myself  and the radiologist and show no traumatic injury but concern for right segmental PE and will need a dedicated CTA of his chest at a later time per the radiologist.  Will hold on heparinizing the patient at this time given he has an acute stroke and he is not currently hypoxic or have any signs of right heart strain.  Additional is unremarkable.  Normal LFTs, lipase.  Negative troponin.  Urine shows no infection.  COVID, flu and RSV negative.   CONSULTS:  Consulted and discussed patient's case with hospitalist, Dr. Damita Dunnings.  I have recommended admission and consulting physician agrees and will place admission orders.  Patient (and family if present) agree with this plan.   I reviewed all nursing notes, vitals,  pertinent previous records.  All labs, EKGs, imaging ordered have been independently reviewed and interpreted by myself.    OUTSIDE RECORDS REVIEWED: Reviewed patient's last internal medicine visit with Serafina Royals on 07/18/2022.       FINAL CLINICAL IMPRESSION(S) / ED DIAGNOSES   Final diagnoses:  Vertigo  Multiple falls  Acute CVA (cerebrovascular accident) ([place])     Rx / DC Orders   ED Discharge Orders     None        Note:  This document was prepared using Dragon voice recognition software and may include unintentional dictation errors.   Sahmya Arai, Delice Bison, DO 08/24/22 [phone]

## 2022-08-24 NOTE — Progress Notes (Signed)
Anticoagulation monitoring(Lovenox):  77 yo male ordered Lovenox 40 mg Q24h    Filed Weights   08/24/22 0704  Weight: 102.5 kg (226 lb)   BMI 30.6   Lab Results  Component Value Date   CREATININE 1.53 (H) 08/24/2022   CREATININE 1.47 (H) 08/23/2022   CREATININE 1.55 (H) 02/20/2022   Estimated Creatinine Clearance: 50.1 mL/min (A) (by C-G formula based on SCr of 1.53 mg/dL (H)). Hemoglobin & Hematocrit     Component Value Date/Time   HGB 13.1 08/24/2022 0557   HGB 12.3 (L) 10/19/2015 1048   HCT 41.8 08/24/2022 0557   HCT 37.6 10/19/2015 1048     Per Protocol for Patient with estCrcl > 30 ml/min and BMI > 30, will transition to Lovenox 52.5 mg Q24h.

## 2022-08-24 NOTE — Assessment & Plan Note (Signed)
Continue aspirin and pravastatin

## 2022-08-24 NOTE — Assessment & Plan Note (Signed)
Hold home antihypertensives for permissive hypertension

## 2022-08-24 NOTE — Assessment & Plan Note (Signed)
Continue aspirin, pravastatin.  Hold losartan for permissive hypertension

## 2022-08-24 NOTE — Assessment & Plan Note (Signed)
Followed by urology.   

## 2022-08-24 NOTE — Assessment & Plan Note (Signed)
Renal function at baseline 

## 2022-08-24 NOTE — Assessment & Plan Note (Signed)
CPAP nightly

## 2022-08-24 NOTE — Progress Notes (Signed)
SLP Cancellation Note  Patient Details Name: Jared Cline MRN: [id number] DOB: 1945/09/18   Cancelled treatment:       Reason Eval/Treat Not Completed: SLP screened, no needs identified, will sign off (chart reviewed; consulted NSG then met w/ pt and Wife in room.) Pt just finishing w/ ECHO. Pt denied any difficulty swallowing and is currently on a regular diet; tolerates swallowing pills w/ water per NSG. Pt drinking water in the room w/out difficulty. Pt conversed in conversation w/ this SLP and Wife, also ECHO tech, w/out expressive/receptive deficits noted; pt denied any speech-language deficits. Speech clear. No further skilled ST services indicated as pt appears at his baseline. Pt agreed. NSG to reconsult if any change in status while admitted.     Orinda Kenner, MS, CCC-SLP Speech Language Pathologist Rehab Services; [place] [phone] (ascom) Jared Cline 08/24/2022, 11:00 AM

## 2022-08-24 NOTE — Progress Notes (Signed)
PT Cancellation Note  Patient Details Name: Jared Cline MRN: [id number] DOB: Jan 05, 1946   Cancelled Treatment:    Reason Eval/Treat Not Completed: Patient not medically ready PT orders received, chart reviewed. CT shows concern for PE in RUL & recommending further work-up. MD requesting to hold PT until tomorrow.  Lavone Nian, PT, DPT 08/24/22, 9:40 AM   [name] 08/24/2022, 9:39 AM

## 2022-08-24 NOTE — Progress Notes (Signed)
PROGRESS NOTE    Jared Cline  SWH:[id number] DOB: 1946-07-05 DOA: 08/24/2022 PCP: Bo Merino, FNP   Brief Narrative:   77 y.o. male with medical history significant for CAD, HTN, HLD CKD 3A, OSA on CPAP, history of prostate cancer s/p prostatectomy, complex renal cysts followed annually by urology, who was brought in by EMS for a fall with inability to get up.  Over the past several days patient has had dizziness and weakness resulting in multiple falls.  MRI brain was consistent with acute punctate infarct in the cerebellar region.  CT of the chest abdomen pelvis did not show any acute pathology but there was concerns for segmental pulmonary embolism.   Assessment & Plan:  Principal Problem:   Acute cerebrovascular accident of cerebellum ([place]) Active Problems:   Frequent falls   Abdominal pain   Abnormal CT of the chest   Hypertension   Stage 3a chronic kidney disease (HCC)   Renal cyst   OSA on CPAP   PVD (peripheral vascular disease) (HCC)   Coronary artery disease involving native coronary artery of native heart   History of radical prostatectomy   Cerebellar stroke, acute (HCC)     Assessment and Plan: * Acute cerebrovascular accident of cerebellum ([place]) Frequent falls MRI showing Punctate acute infarct in the posteroinferior pons/right cerebellar peduncle.  Patient will likely need dual antiplatelet therapy, will defer to neurology.  For now we will check A1c, lipid panel. - Permissive hypertension - Neurology consulted this morning by me, spoke with Dr Quinn Axe  Abdominal pain No acute pathology.  Will continue to monitor.  Abnormal CT of the chest Equivocal segmental PE.  Patient does have CKD stage IIIa and just received contrast for CT chest abdomen pelvis.  Will perform lower extremity Dopplers, if negative we will try and do CTA chest later today or tomorrow  History of radical prostatectomy No acute issues  Coronary artery disease involving native coronary  artery of native heart Continue aspirin, pravastatin.  Permissive hypertension  PVD (peripheral vascular disease) (HCC) Continue aspirin and pravastatin  OSA on CPAP CPAP nightly  Renal cyst Followed by urology  Stage 3a chronic kidney disease ([place]) Renal function at baseline  Hypertension Hold home antihypertensives for permissive hypertension   DVT prophylaxis: Lovenox Code Status: Full code Family Communication:  Wife at bedside  Status is: Observation On going eval for cva and stroke   Subjective: Feels better.  Denies any hx of CVA in the past.  Tells me his calf has beenhurting for a few days   Examination:  General exam: Appears calm and comfortable  Respiratory system: Clear to auscultation. Respiratory effort normal. Cardiovascular system: S1 & S2 heard, RRR. No JVD, murmurs, rubs, gallops or clicks. No pedal edema. Gastrointestinal system: Abdomen is nondistended, soft and nontender. No organomegaly or masses felt. Normal bowel sounds heard. Central nervous system: Alert and oriented. No focal neurological deficits. Extremities: Symmetric 5 x 5 power. Skin: No rashes, lesions or ulcers Psychiatry: Judgement and insight appear normal. Mood & affect appropriate.     Objective: Vitals:   08/23/22 2005 08/24/22 0035 08/24/22 0420 08/24/22 0704  BP: (!) 145/66 (!) 144/66 (!) 142/63   Pulse: 74 74 76   Resp: '18 18 15   '$ Temp: 98.4 F (36.9 C) 98.1 F (36.7 C) 98.4 F (36.9 C)   TempSrc: Oral Oral Oral   SpO2: 100% 99% 98%   Weight:    102.5 kg   No intake or output data in  the 24 hours ending 08/24/22 0850 Filed Weights   08/24/22 0704  Weight: 102.5 kg     Data Reviewed:   CBC: Recent Labs  Lab 08/23/22 2016 08/24/22 0557  WBC 10.0 8.3  NEUTROABS 6.5  --   HGB 13.7 13.1  HCT 42.7 41.8  MCV 86.6 86.9  PLT 342 109   Basic Metabolic Panel: Recent Labs  Lab 08/23/22 2016 08/24/22 0557  NA 134*  --   K 4.3  --   CL 98  --   CO2  25  --   GLUCOSE 97  --   BUN 28*  --   CREATININE 1.47* 1.53*  CALCIUM 9.6  --    GFR: Estimated Creatinine Clearance: 50.1 mL/min (A) (by C-G formula based on SCr of 1.53 mg/dL (H)). Liver Function Tests: Recent Labs  Lab 08/24/22 0347  AST 17  ALT 9  ALKPHOS 50  BILITOT 0.9  PROT 7.2  ALBUMIN 3.9   Recent Labs  Lab 08/24/22 0347  LIPASE 28   No results for input(s): "AMMONIA" in the last 168 hours. Coagulation Profile: No results for input(s): "INR", "PROTIME" in the last 168 hours. Cardiac Enzymes: No results for input(s): "CKTOTAL", "CKMB", "CKMBINDEX", "TROPONINI" in the last 168 hours. BNP (last 3 results) No results for input(s): "PROBNP" in the last 8760 hours. HbA1C: No results for input(s): "HGBA1C" in the last 72 hours. CBG: No results for input(s): "GLUCAP" in the last 168 hours. Lipid Profile: No results for input(s): "CHOL", "HDL", "LDLCALC", "TRIG", "CHOLHDL", "LDLDIRECT" in the last 72 hours. Thyroid Function Tests: No results for input(s): "TSH", "T4TOTAL", "FREET4", "T3FREE", "THYROIDAB" in the last 72 hours. Anemia Panel: No results for input(s): "VITAMINB12", "FOLATE", "FERRITIN", "TIBC", "IRON", "RETICCTPCT" in the last 72 hours. Sepsis Labs: No results for input(s): "PROCALCITON", "LATICACIDVEN" in the last 168 hours.  Recent Results (from the past 240 hour(s))  Resp panel by RT-PCR (RSV, Flu A&B, Covid) Anterior Nasal Swab     Status: None   Collection Time: 08/24/22  3:47 AM   Specimen: Anterior Nasal Swab  Result Value Ref Range Status   SARS Coronavirus 2 by RT PCR NEGATIVE NEGATIVE Final    Comment: (NOTE) SARS-CoV-2 target nucleic acids are NOT DETECTED.  The SARS-CoV-2 RNA is generally detectable in upper respiratory specimens during the acute phase of infection. The lowest concentration of SARS-CoV-2 viral copies this assay can detect is 138 copies/mL. A negative result does not preclude SARS-Cov-2 infection and should not be  used as the sole basis for treatment or other patient management decisions. A negative result may occur with  improper specimen collection/handling, submission of specimen other than nasopharyngeal swab, presence of viral mutation(s) within the areas targeted by this assay, and inadequate number of viral copies(<138 copies/mL). A negative result must be combined with clinical observations, patient history, and epidemiological information. The expected result is Negative.  Fact Sheet for Patients:  EntrepreneurPulse.com.au  Fact Sheet for Healthcare Providers:  IncredibleEmployment.be  This test is no t yet approved or cleared by the [place] FDA and  has been authorized for detection and/or diagnosis of SARS-CoV-2 by FDA under an Emergency Use Authorization (EUA). This EUA will remain  in effect (meaning this test can be used) for the duration of the COVID-19 declaration under Section 564(b)(1) of the Act, 21 U.S.C.section 360bbb-3(b)(1), unless the authorization is terminated  or revoked sooner.       Influenza A by PCR NEGATIVE NEGATIVE Final   Influenza B by  PCR NEGATIVE NEGATIVE Final    Comment: (NOTE) The Xpert Xpress SARS-CoV-2/FLU/RSV plus assay is intended as an aid in the diagnosis of influenza from Nasopharyngeal swab specimens and should not be used as a sole basis for treatment. Nasal washings and aspirates are unacceptable for Xpert Xpress SARS-CoV-2/FLU/RSV testing.  Fact Sheet for Patients: EntrepreneurPulse.com.au  Fact Sheet for Healthcare Providers: IncredibleEmployment.be  This test is not yet approved or cleared by the [place] FDA and has been authorized for detection and/or diagnosis of SARS-CoV-2 by FDA under an Emergency Use Authorization (EUA). This EUA will remain in effect (meaning this test can be used) for the duration of the COVID-19 declaration under Section  564(b)(1) of the Act, 21 U.S.C. section 360bbb-3(b)(1), unless the authorization is terminated or revoked.     Resp Syncytial Virus by PCR NEGATIVE NEGATIVE Final    Comment: (NOTE) Fact Sheet for Patients: EntrepreneurPulse.com.au  Fact Sheet for Healthcare Providers: IncredibleEmployment.be  This test is not yet approved or cleared by the [place] FDA and has been authorized for detection and/or diagnosis of SARS-CoV-2 by FDA under an Emergency Use Authorization (EUA). This EUA will remain in effect (meaning this test can be used) for the duration of the COVID-19 declaration under Section 564(b)(1) of the Act, 21 U.S.C. section 360bbb-3(b)(1), unless the authorization is terminated or revoked.  Performed at [facility], [address], [place], [place] [address]          Radiology Studies: CT CHEST ABDOMEN PELVIS W CONTRAST  Result Date: 08/24/2022 CLINICAL DATA:  Blunt trauma due to multiple falls over the past few days. Found on floor. Generalized weakness EXAM: CT CHEST, ABDOMEN, AND PELVIS WITH CONTRAST TECHNIQUE: Multidetector CT imaging of the chest, abdomen and pelvis was performed following the standard protocol during bolus administration of intravenous contrast. RADIATION DOSE REDUCTION: This exam was performed according to the departmental dose-optimization program which includes automated exposure control, adjustment of the mA and/or kV according to patient size and/or use of iterative reconstruction technique. CONTRAST:  151m OMNIPAQUE IOHEXOL 300 MG/ML  SOLN COMPARISON:  04/06/2018 chest CT FINDINGS: CT CHEST FINDINGS Cardiovascular: Normal heart size. No pericardial effusion. Atheromatous calcification of the aorta and coronaries. No traumatic arterial finding. Concern for segmental filling defect within right upper lobe pulmonary artery, seen on source and reformatted images. Direct communication of recommended PE  study. Mediastinum/Nodes: No hemothorax, pneumothorax, or pulmonary contusion. Lungs/Pleura: 5 mm pulmonary nodule in the right lung on 4:56 is stable from 2019 and considered benign. Musculoskeletal: Spondylosis with multi-level bridging osteophyte. CT ABDOMEN PELVIS FINDINGS Hepatobiliary: No hepatic injury or perihepatic hematoma. Gallbladder is unremarkable. Pancreas: Negative Spleen: No splenic injury or perisplenic hematoma. Adrenals/Urinary Tract: No adrenal hemorrhage or renal injury identified. Bladder is full. Bilateral renal lesions of various complexity including solid and enhancing appearing lesion at the lower pole right kidney measuring 14 mm, not significantly changed from prior MRI of the abdomen 07/09/2021 Stomach/Bowel: No visible injury Vascular/Lymphatic: Atherosclerosis with high-grade narrowing at the celiac origin, SMA patency best seen on sagittal reformats. Reproductive: No acute finding Other: No ascites or pneumoperitoneum Musculoskeletal: Generalized lumbar spine degeneration. No evidence of fracture or traumatic malalignment. IMPRESSION: 1. No evidence of injury to the chest or abdomen. 2. Equivocal for segmental pulmonary embolism in the right upper lobe, enough concern to recommend follow-up PE study when renal function permits. 3. Multiple complicated renal cysts and solid right renal lesion, reference abdominal MRI with follow-up recommendations 07/09/2021 4. Atherosclerosis including the coronary  arteries. Electronically Signed   By: Jorje Guild M.D.   On: 08/24/2022 04:25   MR BRAIN WO CONTRAST  Result Date: 08/24/2022 CLINICAL DATA:  Hypertension weakness, multiple falls EXAM: MRI HEAD WITHOUT CONTRAST TECHNIQUE: Multiplanar, multiecho pulse sequences of the brain and surrounding structures were obtained without intravenous contrast. COMPARISON:  No prior MRI available, correlation is made with CT head 08/24/2022 FINDINGS: Brain: Punctate focus of restricted diffusion with  likely ADC correlate in the posteroinferior pons/right cerebellar peduncle (series 9, image 12). This area is associated with mildly increased T2 hyperintense signal. No acute hemorrhage, mass, mass effect, or midline shift. No hydrocephalus or extra-axial collection.No hemosiderin deposition to suggest remote hemorrhage.Normal pituitary and craniocervical junction.Scattered T2 hyperintense signal in the periventricular white matter and pons, likely the sequela of mild-to-moderate chronic small vessel ischemic disease. Vascular: Patent arterial flow voids. Skull and upper cervical spine: Normal marrow signal. Sinuses/Orbits: Clear paranasal sinuses.No acute finding in the orbits. Other: The mastoid air cells are well aerated. 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 These results were called by telephone at the time of interpretation on 08/24/2022 at 3:45 am to provider [facility] , who verbally acknowledged these results. Electronically Signed   By: Merilyn Baba M.D.   On: 08/24/2022 03:46   CT HEAD WO CONTRAST (5MM)  Result Date: 08/24/2022 CLINICAL DATA:  Hypertension, weakness, multiple falls EXAM: CT HEAD WITHOUT CONTRAST CT CERVICAL SPINE WITHOUT CONTRAST TECHNIQUE: Multidetector CT imaging of the head and cervical spine was performed following the standard protocol without intravenous contrast. Multiplanar CT image reconstructions of the cervical spine were also generated. RADIATION DOSE REDUCTION: This exam was performed according to the departmental dose-optimization program which includes automated exposure control, adjustment of the mA and/or kV according to patient size and/or use of iterative reconstruction technique. COMPARISON:  None Available. FINDINGS: CT HEAD FINDINGS Brain: No evidence of acute infarct, hemorrhage, mass, mass effect, or midline shift. No hydrocephalus or extra-axial fluid collection. Cerebral volume is within normal limits for  age. Periventricular white matter changes, likely the sequela of chronic small vessel ischemic disease. Vascular: No hyperdense vessel. Skull: Normal. Negative for fracture or focal lesion. Sinuses/Orbits: No acute finding. Other: The mastoid air cells are well aerated. CT CERVICAL SPINE FINDINGS Alignment: Straightening of the normal cervical lordosis. 2 mm anterolisthesis of C7 on T1, which appears facet mediated. No traumatic listhesis. Skull base and vertebrae: No acute fracture. No primary bone lesion or focal pathologic process. Soft tissues and spinal canal: No prevertebral fluid or swelling. No visible canal hematoma. Disc levels: Multilevel degenerative changes, superimposed on congenitally short pedicles, which narrow the AP diameter of the spinal canal. Disc height loss, most prominent C5-C6 and C6-C7. Mild spinal canal stenosis at C3-C4, C5-C6, and C6-C7. Multilevel uncovertebral and facet arthropathy causes moderate or severe neural foraminal narrowing C3-C7. Upper chest: No focal pulmonary opacity or pleural effusion. Other: Negative. IMPRESSION: 1. No acute intracranial process. 2. No acute fracture or traumatic listhesis in the cervical spine. Electronically Signed   By: Merilyn Baba M.D.   On: 08/24/2022 02:16   CT Cervical Spine Wo Contrast  Result Date: 08/24/2022 CLINICAL DATA:  Hypertension, weakness, multiple falls EXAM: CT HEAD WITHOUT CONTRAST CT CERVICAL SPINE WITHOUT CONTRAST TECHNIQUE: Multidetector CT imaging of the head and cervical spine was performed following the standard protocol without intravenous contrast. Multiplanar CT image reconstructions of the cervical spine were also generated. RADIATION DOSE REDUCTION: This exam was performed according to the  departmental dose-optimization program which includes automated exposure control, adjustment of the mA and/or kV according to patient size and/or use of iterative reconstruction technique. COMPARISON:  None Available. FINDINGS: CT  HEAD FINDINGS Brain: No evidence of acute infarct, hemorrhage, mass, mass effect, or midline shift. No hydrocephalus or extra-axial fluid collection. Cerebral volume is within normal limits for age. Periventricular white matter changes, likely the sequela of chronic small vessel ischemic disease. Vascular: No hyperdense vessel. Skull: Normal. Negative for fracture or focal lesion. Sinuses/Orbits: No acute finding. Other: The mastoid air cells are well aerated. CT CERVICAL SPINE FINDINGS Alignment: Straightening of the normal cervical lordosis. 2 mm anterolisthesis of C7 on T1, which appears facet mediated. No traumatic listhesis. Skull base and vertebrae: No acute fracture. No primary bone lesion or focal pathologic process. Soft tissues and spinal canal: No prevertebral fluid or swelling. No visible canal hematoma. Disc levels: Multilevel degenerative changes, superimposed on congenitally short pedicles, which narrow the AP diameter of the spinal canal. Disc height loss, most prominent C5-C6 and C6-C7. Mild spinal canal stenosis at C3-C4, C5-C6, and C6-C7. Multilevel uncovertebral and facet arthropathy causes moderate or severe neural foraminal narrowing C3-C7. Upper chest: No focal pulmonary opacity or pleural effusion. Other: Negative. IMPRESSION: 1. No acute intracranial process. 2. No acute fracture or traumatic listhesis in the cervical spine. Electronically Signed   By: Merilyn Baba M.D.   On: 08/24/2022 02:16        Scheduled Meds:  [START ON 08/25/2022]  stroke: early stages of recovery book   Does not apply Once   aspirin EC  81 mg Oral Daily   clopidogrel  75 mg Oral Daily   enoxaparin (LOVENOX) injection  0.5 mg/kg Subcutaneous Q24H   pravastatin  80 mg Oral QHS   Continuous Infusions:   LOS: 0 days   Time spent= 35 mins    Valeri Sula Arsenio Loader, MD [facility]  If 7PM-7AM, please contact night-coverage  08/24/2022, 8:50 AM

## 2022-08-24 NOTE — Assessment & Plan Note (Signed)
CT chest shows: "Equivocal for segmental pulmonary embolism in the right upper lobe, enough concern to recommend follow-up PE study when renal function permits" Repeat CTA when appropriate or consider V/Q

## 2022-08-24 NOTE — H&P (Signed)
History and Physical    Patient: Jared Cline STM:[id number] DOB: Jun 30, 1946 DOA: 08/24/2022 DOS: the patient was seen and examined on 08/24/2022 PCP: Bo Merino, FNP  Patient coming from: Home  Chief Complaint:  Chief Complaint  Patient presents with   Dizziness   Fall   Weakness    HPI: Jared Cline is a 77 y.o. male with medical history significant for CAD, HTN, HLD CKD 3A, OSA on CPAP, history of prostate cancer s/p prostatectomy, complex renal cysts followed annually by urology, who was brought in by EMS for a fall with inability to get up.  Over the past several days patient has had dizziness and weakness resulting in multiple falls.  He was seen in the ED at [facility] on 08/19/2022 for dizziness and vomiting with SBP 220.  Syncope workup was significant for an age-indeterminate calcified embolus on CT head.  Symptoms were ultimately believed related to his blood pressure and he was observed and discharged.  Patient continued to have symptoms and after falling again on the night of arrival, return for evaluation.  He continues to complain of abdominal pain. ED course and data review: BP 145/66 with otherwise normal vitals.  Blood work unremarkable and at his baseline.EKG, personally viewed and interpreted shows sinus at 71 with no ischemic ST-T wave changes. MRI consistent with acute cerebellar stroke as follows: IMPRESSION: Punctate acute infarct in the posteroinferior pons/right cerebellar peduncle.  CT chest abdomen and pelvis were ordered for workup of persistent abdominal pain.   IMPRESSION: 1. No evidence of injury to the chest or abdomen. 2. Equivocal for segmental pulmonary embolism in the right upper lobe, enough concern to recommend follow-up PE study when renal function permits. 3. Multiple complicated renal cysts and solid right renal lesion, reference abdominal MRI with follow-up recommendations 07/09/2021 4. Atherosclerosis including the coronary  arteries.  Hospitalist consulted for admission.   Review of Systems: As mentioned in the history of present illness. All other systems reviewed and are negative.  Past Medical History:  Diagnosis Date   BPH (benign prostatic hypertrophy)    Bradycardia    evaluated by Dr. Nehemiah Massed   Cancer [facility])    prostate   Carpal tunnel syndrome, left    left arm   CKD (chronic kidney disease) stage 2, GFR 60-89 ml/min    Diverticulitis    History of prostate cancer    HNP (herniated nucleus pulposus), lumbar    Hyperlipidemia    Hypertension    IBS (irritable bowel syndrome)    Impotence    Multiple lung nodules    nonspecific, largest 63m in Jan 2016; scanned March 2017; rescan 3-6 months later   OA (osteoarthritis) of knee    right   Renal mass    evaluated; followed by urologist   Sleep apnea    on CPAP   Torn meniscus    x 2; right knee   Past Surgical History:  Procedure Laterality Date   abdominal ultrasound  Oct 2015   Negative for eval for AAA   COLONOSCOPY     KNEE ARTHROSCOPY Right    x 2   LUMBAR LAMINECTOMY     PROSTATECTOMY     robotic   Social History:  reports that he quit smoking about 51 years ago. His smoking use included cigarettes. He has a 4.50 pack-year smoking history. He has been exposed to tobacco smoke. He has never used smokeless tobacco. He reports that he does not drink alcohol and does not use drugs.  Allergies  Allergen Reactions   Flomax [Tamsulosin Hcl] Shortness Of Breath   Ace Inhibitors Other (See Comments)    Angioedema   Amlodipine Other (See Comments)    Angioedema   Doxazosin    Lipitor [Atorvastatin] Other (See Comments)    Memory loss    Family History  Problem Relation Age of Onset   Stroke Mother    Hypertension Mother    Aneurysm Father        cerebral   Hypertension Father    Heart disease Paternal Uncle    Alcohol abuse Brother    Cancer Neg Hx    COPD Neg Hx    Diabetes Neg Hx     Prior to Admission  medications   Medication Sig Start Date End Date Taking? Authorizing Provider  aspirin EC 81 MG tablet Take 81 mg by mouth daily.    [provider]  cloNIDine (CATAPRES) 0.2 MG tablet Take 1 tablet (0.2 mg total) by mouth 2 (two) times daily. TAKE 1 TABLET(0.2 MG) BY MOUTH TWICE DAILY 03/21/22   Bo Merino, FNP  hydrALAZINE (APRESOLINE) 10 MG tablet Take 1 tablet (10 mg total) by mouth 2 (two) times daily as needed. Take if blood pressure greater than 140/90. 11/02/21   Bo Merino, FNP  hydrochlorothiazide (HYDRODIURIL) 12.5 MG tablet TAKE 1 TABLET(12.5 MG) BY MOUTH DAILY 06/03/22   Bo Merino, FNP  losartan (COZAAR) 50 MG tablet     [provider]  magnesium oxide (MAG-OX) 400 MG tablet Take 400 mg by mouth daily.    [provider]  Potassium Gluconate 550 MG TABS Take 550 mg by mouth daily.    [provider]  pravastatin (PRAVACHOL) 80 MG tablet Take 1 tablet (80 mg total) by mouth at bedtime. 10/05/21   Bo Merino, FNP  spironolactone (ALDACTONE) 25 MG tablet TAKE 1 TABLET(25 MG) BY MOUTH DAILY 10/05/21   Bo Merino, FNP    Physical Exam: Vitals:   08/23/22 2005 08/24/22 0035 08/24/22 0420  BP: (!) 145/66 (!) 144/66 (!) 142/63  Pulse: 74 74 76  Resp: '18 18 15  '$ Temp: 98.4 F (36.9 C) 98.1 F (36.7 C) 98.4 F (36.9 C)  TempSrc: Oral Oral Oral  SpO2: 100% 99% 98%   Physical Exam Vitals and nursing note reviewed.  Constitutional:      General: He is not in acute distress. HENT:     Head: Normocephalic and atraumatic.  Cardiovascular:     Rate and Rhythm: Normal rate and regular rhythm.     Heart sounds: Normal heart sounds.  Pulmonary:     Effort: Pulmonary effort is normal.     Breath sounds: Normal breath sounds.  Abdominal:     Palpations: Abdomen is soft.     Tenderness: There is no abdominal tenderness.  Neurological:     Mental Status: Mental status is at baseline.     Labs on Admission: I have personally  reviewed following labs and imaging studies  CBC: Recent Labs  Lab 08/23/22 2016  WBC 10.0  NEUTROABS 6.5  HGB 13.7  HCT 42.7  MCV 86.6  PLT 450   Basic Metabolic Panel: Recent Labs  Lab 08/23/22 2016  NA 134*  K 4.3  CL 98  CO2 25  GLUCOSE 97  BUN 28*  CREATININE 1.47*  CALCIUM 9.6   GFR: CrCl cannot be calculated (Unknown ideal weight.). Liver Function Tests: No results for input(s): "AST", "ALT", "ALKPHOS", "BILITOT", "PROT", "ALBUMIN"  in the last 168 hours. Recent Labs  Lab 08/24/22 0347  LIPASE 28   No results for input(s): "AMMONIA" in the last 168 hours. Coagulation Profile: No results for input(s): "INR", "PROTIME" in the last 168 hours. Cardiac Enzymes: No results for input(s): "CKTOTAL", "CKMB", "CKMBINDEX", "TROPONINI" in the last 168 hours. BNP (last 3 results) No results for input(s): "PROBNP" in the last 8760 hours. HbA1C: No results for input(s): "HGBA1C" in the last 72 hours. CBG: No results for input(s): "GLUCAP" in the last 168 hours. Lipid Profile: No results for input(s): "CHOL", "HDL", "LDLCALC", "TRIG", "CHOLHDL", "LDLDIRECT" in the last 72 hours. Thyroid Function Tests: No results for input(s): "TSH", "T4TOTAL", "FREET4", "T3FREE", "THYROIDAB" in the last 72 hours. Anemia Panel: No results for input(s): "VITAMINB12", "FOLATE", "FERRITIN", "TIBC", "IRON", "RETICCTPCT" in the last 72 hours. Urine analysis:    Component Value Date/Time   COLORURINE Yellow 08/25/2014 1010   APPEARANCEUR Clear 07/25/2021 1004   LABSPEC 1.017 08/25/2014 1010   PHURINE 6.0 08/25/2014 1010   GLUCOSEU Negative 07/25/2021 1004   GLUCOSEU Negative 08/25/2014 1010   HGBUR Negative 08/25/2014 1010   BILIRUBINUR Negative 07/25/2021 1004   BILIRUBINUR Negative 08/25/2014 1010   KETONESUR Negative 08/25/2014 1010   PROTEINUR Negative 07/25/2021 1004   PROTEINUR Negative 08/25/2014 1010   UROBILINOGEN 0.2 05/02/2021 1053   NITRITE Negative 07/25/2021 1004    NITRITE Negative 08/25/2014 1010   LEUKOCYTESUR Negative 07/25/2021 1004   LEUKOCYTESUR Negative 08/25/2014 1010    Radiological Exams on Admission: CT CHEST ABDOMEN PELVIS W CONTRAST  Result Date: 08/24/2022 CLINICAL DATA:  Blunt trauma due to multiple falls over the past few days. Found on floor. Generalized weakness EXAM: CT CHEST, ABDOMEN, AND PELVIS WITH CONTRAST TECHNIQUE: Multidetector CT imaging of the chest, abdomen and pelvis was performed following the standard protocol during bolus administration of intravenous contrast. RADIATION DOSE REDUCTION: This exam was performed according to the departmental dose-optimization program which includes automated exposure control, adjustment of the mA and/or kV according to patient size and/or use of iterative reconstruction technique. CONTRAST:  171m OMNIPAQUE IOHEXOL 300 MG/ML  SOLN COMPARISON:  04/06/2018 chest CT FINDINGS: CT CHEST FINDINGS Cardiovascular: Normal heart size. No pericardial effusion. Atheromatous calcification of the aorta and coronaries. No traumatic arterial finding. Concern for segmental filling defect within right upper lobe pulmonary artery, seen on source and reformatted images. Direct communication of recommended PE study. Mediastinum/Nodes: No hemothorax, pneumothorax, or pulmonary contusion. Lungs/Pleura: 5 mm pulmonary nodule in the right lung on 4:56 is stable from 2019 and considered benign. Musculoskeletal: Spondylosis with multi-level bridging osteophyte. CT ABDOMEN PELVIS FINDINGS Hepatobiliary: No hepatic injury or perihepatic hematoma. Gallbladder is unremarkable. Pancreas: Negative Spleen: No splenic injury or perisplenic hematoma. Adrenals/Urinary Tract: No adrenal hemorrhage or renal injury identified. Bladder is full. Bilateral renal lesions of various complexity including solid and enhancing appearing lesion at the lower pole right kidney measuring 14 mm, not significantly changed from prior MRI of the abdomen  07/09/2021 Stomach/Bowel: No visible injury Vascular/Lymphatic: Atherosclerosis with high-grade narrowing at the celiac origin, SMA patency best seen on sagittal reformats. Reproductive: No acute finding Other: No ascites or pneumoperitoneum Musculoskeletal: Generalized lumbar spine degeneration. No evidence of fracture or traumatic malalignment. IMPRESSION: 1. No evidence of injury to the chest or abdomen. 2. Equivocal for segmental pulmonary embolism in the right upper lobe, enough concern to recommend follow-up PE study when renal function permits. 3. Multiple complicated renal cysts and solid right renal lesion, reference abdominal MRI with follow-up recommendations 07/09/2021  4. Atherosclerosis including the coronary arteries. Electronically Signed   By: Jorje Guild M.D.   On: 08/24/2022 04:25   MR BRAIN WO CONTRAST  Result Date: 08/24/2022 CLINICAL DATA:  Hypertension weakness, multiple falls EXAM: MRI HEAD WITHOUT CONTRAST TECHNIQUE: Multiplanar, multiecho pulse sequences of the brain and surrounding structures were obtained without intravenous contrast. COMPARISON:  No prior MRI available, correlation is made with CT head 08/24/2022 FINDINGS: Brain: Punctate focus of restricted diffusion with likely ADC correlate in the posteroinferior pons/right cerebellar peduncle (series 9, image 12). This area is associated with mildly increased T2 hyperintense signal. No acute hemorrhage, mass, mass effect, or midline shift. No hydrocephalus or extra-axial collection.No hemosiderin deposition to suggest remote hemorrhage.Normal pituitary and craniocervical junction.Scattered T2 hyperintense signal in the periventricular white matter and pons, likely the sequela of mild-to-moderate chronic small vessel ischemic disease. Vascular: Patent arterial flow voids. Skull and upper cervical spine: Normal marrow signal. Sinuses/Orbits: Clear paranasal sinuses.No acute finding in the orbits. Other: The mastoid air cells are  well aerated. 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 These results were called by telephone at the time of interpretation on 08/24/2022 at 3:45 am to provider [facility] , who verbally acknowledged these results. Electronically Signed   By: Merilyn Baba M.D.   On: 08/24/2022 03:46   CT HEAD WO CONTRAST (5MM)  Result Date: 08/24/2022 CLINICAL DATA:  Hypertension, weakness, multiple falls EXAM: CT HEAD WITHOUT CONTRAST CT CERVICAL SPINE WITHOUT CONTRAST TECHNIQUE: Multidetector CT imaging of the head and cervical spine was performed following the standard protocol without intravenous contrast. Multiplanar CT image reconstructions of the cervical spine were also generated. RADIATION DOSE REDUCTION: This exam was performed according to the departmental dose-optimization program which includes automated exposure control, adjustment of the mA and/or kV according to patient size and/or use of iterative reconstruction technique. COMPARISON:  None Available. FINDINGS: CT HEAD FINDINGS Brain: No evidence of acute infarct, hemorrhage, mass, mass effect, or midline shift. No hydrocephalus or extra-axial fluid collection. Cerebral volume is within normal limits for age. Periventricular white matter changes, likely the sequela of chronic small vessel ischemic disease. Vascular: No hyperdense vessel. Skull: Normal. Negative for fracture or focal lesion. Sinuses/Orbits: No acute finding. Other: The mastoid air cells are well aerated. CT CERVICAL SPINE FINDINGS Alignment: Straightening of the normal cervical lordosis. 2 mm anterolisthesis of C7 on T1, which appears facet mediated. No traumatic listhesis. Skull base and vertebrae: No acute fracture. No primary bone lesion or focal pathologic process. Soft tissues and spinal canal: No prevertebral fluid or swelling. No visible canal hematoma. Disc levels: Multilevel degenerative changes, superimposed on congenitally short  pedicles, which narrow the AP diameter of the spinal canal. Disc height loss, most prominent C5-C6 and C6-C7. Mild spinal canal stenosis at C3-C4, C5-C6, and C6-C7. Multilevel uncovertebral and facet arthropathy causes moderate or severe neural foraminal narrowing C3-C7. Upper chest: No focal pulmonary opacity or pleural effusion. Other: Negative. IMPRESSION: 1. No acute intracranial process. 2. No acute fracture or traumatic listhesis in the cervical spine. Electronically Signed   By: Merilyn Baba M.D.   On: 08/24/2022 02:16   CT Cervical Spine Wo Contrast  Result Date: 08/24/2022 CLINICAL DATA:  Hypertension, weakness, multiple falls EXAM: CT HEAD WITHOUT CONTRAST CT CERVICAL SPINE WITHOUT CONTRAST TECHNIQUE: Multidetector CT imaging of the head and cervical spine was performed following the standard protocol without intravenous contrast. Multiplanar CT image reconstructions of the cervical spine were also generated. RADIATION DOSE REDUCTION: This exam  was performed according to the departmental dose-optimization program which includes automated exposure control, adjustment of the mA and/or kV according to patient size and/or use of iterative reconstruction technique. COMPARISON:  None Available. FINDINGS: CT HEAD FINDINGS Brain: No evidence of acute infarct, hemorrhage, mass, mass effect, or midline shift. No hydrocephalus or extra-axial fluid collection. Cerebral volume is within normal limits for age. Periventricular white matter changes, likely the sequela of chronic small vessel ischemic disease. Vascular: No hyperdense vessel. Skull: Normal. Negative for fracture or focal lesion. Sinuses/Orbits: No acute finding. Other: The mastoid air cells are well aerated. CT CERVICAL SPINE FINDINGS Alignment: Straightening of the normal cervical lordosis. 2 mm anterolisthesis of C7 on T1, which appears facet mediated. No traumatic listhesis. Skull base and vertebrae: No acute fracture. No primary bone lesion or focal  pathologic process. Soft tissues and spinal canal: No prevertebral fluid or swelling. No visible canal hematoma. Disc levels: Multilevel degenerative changes, superimposed on congenitally short pedicles, which narrow the AP diameter of the spinal canal. Disc height loss, most prominent C5-C6 and C6-C7. Mild spinal canal stenosis at C3-C4, C5-C6, and C6-C7. Multilevel uncovertebral and facet arthropathy causes moderate or severe neural foraminal narrowing C3-C7. Upper chest: No focal pulmonary opacity or pleural effusion. Other: Negative. IMPRESSION: 1. No acute intracranial process. 2. No acute fracture or traumatic listhesis in the cervical spine. Electronically Signed   By: Merilyn Baba M.D.   On: 08/24/2022 02:16     Data Reviewed: Relevant notes from primary care and specialist visits, past discharge summaries as available in EHR, including Care Everywhere. Prior diagnostic testing as pertinent to current admission diagnoses Updated medications and problem lists for reconciliation ED course, including vitals, labs, imaging, treatment and response to treatment Triage notes, nursing and pharmacy notes and ED provider's notes Notable results as noted in HPI   Assessment and Plan: * Acute cerebrovascular accident of cerebellum ([place]) Frequent falls Patient outside tPA window with symptom onset few days prior MRI showing Punctate acute infarct in the posteroinferior pons/right cerebellar peduncle. Permissive hypertension for first 24-48 hrs post stroke onset: Prn Labetalol IV or Vasotec IV If BP greater than 220/120  Statins for LDL goal less than 70 ASA '81mg'$  daily, Plavix '75mg'$  daily x 3 weeks then monotherapy thereafter Telemetry, echocardiogram Avoid dextrose containing fluids, Maintain euglycemia, euthermia Neuro checks q4 hrs x 24 hrs and then per shift Head of bed 30 degrees Physical therapy/Occupational therapy/Speech therapy if failed dysphagia screen Neurology consult to  follow   Abdominal pain CT chest abdomen and pelvis showed no evidence of injury to chest or abdomen Blood work unrevealing Lipase and LFTs WNL  Abnormal CT of the chest CT chest shows: "Equivocal for segmental pulmonary embolism in the right upper lobe, enough concern to recommend follow-up PE study when renal function permits" Repeat CTA when appropriate or consider V/Q  History of radical prostatectomy No acute issues  Coronary artery disease involving native coronary artery of native heart Continue aspirin, pravastatin.  Hold losartan for permissive hypertension  PVD (peripheral vascular disease) ([facility]) Continue aspirin and pravastatin  OSA on CPAP CPAP nightly  Renal cyst Followed by urology  Stage 3a chronic kidney disease ([place]) Renal function at baseline  Hypertension Hold home antihypertensives for permissive hypertension        DVT prophylaxis: Lovenox  Consults: Neurology, Dr. Quinn Axe  Advance Care Planning: full code  Family Communication: none  Disposition Plan: Back to previous home environment  Severity of Illness: The appropriate patient status for  this patient is OBSERVATION. Observation status is judged to be reasonable and necessary in order to provide the required intensity of service to ensure the patient's safety. The patient's presenting symptoms, physical exam findings, and initial radiographic and laboratory data in the context of their medical condition is felt to place them at decreased risk for further clinical deterioration. Furthermore, it is anticipated that the patient will be medically stable for discharge from the hospital within 2 midnights of admission.   Author: Athena Masse, MD 08/24/2022 4:51 AM  For on call review [email or web address].

## 2022-08-24 NOTE — Assessment & Plan Note (Signed)
Frequent falls Patient outside tPA window with symptom onset few days prior MRI showing Punctate acute infarct in the posteroinferior pons/right cerebellar peduncle. Permissive hypertension for first 24-48 hrs post stroke onset: Prn Labetalol IV or Vasotec IV If BP greater than 220/120  Statins for LDL goal less than 70 ASA '81mg'$  daily, Plavix '75mg'$  daily x 3 weeks then monotherapy thereafter Telemetry, echocardiogram Avoid dextrose containing fluids, Maintain euglycemia, euthermia Neuro checks q4 hrs x 24 hrs and then per shift Head of bed 30 degrees Physical therapy/Occupational therapy/Speech therapy if failed dysphagia screen Neurology consult to follow

## 2022-08-25 ENCOUNTER — Observation Stay: Payer: Medicare Other

## 2022-08-25 DIAGNOSIS — R911 Solitary pulmonary nodule: Secondary | ICD-10-CM | POA: Diagnosis present

## 2022-08-25 DIAGNOSIS — Z7982 Long term (current) use of aspirin: Secondary | ICD-10-CM | POA: Diagnosis not present

## 2022-08-25 DIAGNOSIS — N400 Enlarged prostate without lower urinary tract symptoms: Secondary | ICD-10-CM | POA: Diagnosis present

## 2022-08-25 DIAGNOSIS — E871 Hypo-osmolality and hyponatremia: Secondary | ICD-10-CM | POA: Diagnosis not present

## 2022-08-25 DIAGNOSIS — K5900 Constipation, unspecified: Secondary | ICD-10-CM | POA: Diagnosis not present

## 2022-08-25 DIAGNOSIS — R109 Unspecified abdominal pain: Secondary | ICD-10-CM | POA: Diagnosis not present

## 2022-08-25 DIAGNOSIS — M25561 Pain in right knee: Secondary | ICD-10-CM | POA: Diagnosis not present

## 2022-08-25 DIAGNOSIS — Z8546 Personal history of malignant neoplasm of prostate: Secondary | ICD-10-CM | POA: Diagnosis not present

## 2022-08-25 DIAGNOSIS — G4733 Obstructive sleep apnea (adult) (pediatric): Secondary | ICD-10-CM | POA: Diagnosis present

## 2022-08-25 DIAGNOSIS — D62 Acute posthemorrhagic anemia: Secondary | ICD-10-CM | POA: Diagnosis not present

## 2022-08-25 DIAGNOSIS — I63549 Cerebral infarction due to unspecified occlusion or stenosis of unspecified cerebellar artery: Secondary | ICD-10-CM | POA: Diagnosis present

## 2022-08-25 DIAGNOSIS — I2510 Atherosclerotic heart disease of native coronary artery without angina pectoris: Secondary | ICD-10-CM | POA: Diagnosis present

## 2022-08-25 DIAGNOSIS — I10 Essential (primary) hypertension: Secondary | ICD-10-CM | POA: Diagnosis not present

## 2022-08-25 DIAGNOSIS — Z87891 Personal history of nicotine dependence: Secondary | ICD-10-CM | POA: Diagnosis not present

## 2022-08-25 DIAGNOSIS — Z888 Allergy status to other drugs, medicaments and biological substances status: Secondary | ICD-10-CM | POA: Diagnosis not present

## 2022-08-25 DIAGNOSIS — I739 Peripheral vascular disease, unspecified: Secondary | ICD-10-CM | POA: Diagnosis present

## 2022-08-25 DIAGNOSIS — Z043 Encounter for examination and observation following other accident: Secondary | ICD-10-CM | POA: Diagnosis not present

## 2022-08-25 DIAGNOSIS — R29700 NIHSS score 0: Secondary | ICD-10-CM | POA: Diagnosis present

## 2022-08-25 DIAGNOSIS — Z1152 Encounter for screening for COVID-19: Secondary | ICD-10-CM | POA: Diagnosis not present

## 2022-08-25 DIAGNOSIS — R42 Dizziness and giddiness: Secondary | ICD-10-CM | POA: Diagnosis present

## 2022-08-25 DIAGNOSIS — Z6830 Body mass index (BMI) 30.0-30.9, adult: Secondary | ICD-10-CM | POA: Diagnosis not present

## 2022-08-25 DIAGNOSIS — Z823 Family history of stroke: Secondary | ICD-10-CM | POA: Diagnosis not present

## 2022-08-25 DIAGNOSIS — K581 Irritable bowel syndrome with constipation: Secondary | ICD-10-CM | POA: Diagnosis present

## 2022-08-25 DIAGNOSIS — M1711 Unilateral primary osteoarthritis, right knee: Secondary | ICD-10-CM | POA: Diagnosis present

## 2022-08-25 DIAGNOSIS — Z8249 Family history of ischemic heart disease and other diseases of the circulatory system: Secondary | ICD-10-CM | POA: Diagnosis not present

## 2022-08-25 DIAGNOSIS — E669 Obesity, unspecified: Secondary | ICD-10-CM | POA: Diagnosis present

## 2022-08-25 DIAGNOSIS — I129 Hypertensive chronic kidney disease with stage 1 through stage 4 chronic kidney disease, or unspecified chronic kidney disease: Secondary | ICD-10-CM | POA: Diagnosis present

## 2022-08-25 DIAGNOSIS — E785 Hyperlipidemia, unspecified: Secondary | ICD-10-CM | POA: Diagnosis present

## 2022-08-25 DIAGNOSIS — I2699 Other pulmonary embolism without acute cor pulmonale: Secondary | ICD-10-CM | POA: Diagnosis not present

## 2022-08-25 DIAGNOSIS — N1831 Chronic kidney disease, stage 3a: Secondary | ICD-10-CM | POA: Diagnosis present

## 2022-08-25 DIAGNOSIS — Z7901 Long term (current) use of anticoagulants: Secondary | ICD-10-CM | POA: Diagnosis not present

## 2022-08-25 DIAGNOSIS — I639 Cerebral infarction, unspecified: Secondary | ICD-10-CM | POA: Diagnosis not present

## 2022-08-25 DIAGNOSIS — I6502 Occlusion and stenosis of left vertebral artery: Secondary | ICD-10-CM | POA: Diagnosis not present

## 2022-08-25 DIAGNOSIS — K30 Functional dyspepsia: Secondary | ICD-10-CM | POA: Diagnosis not present

## 2022-08-25 DIAGNOSIS — R296 Repeated falls: Secondary | ICD-10-CM | POA: Diagnosis present

## 2022-08-25 LAB — BASIC METABOLIC PANEL
Anion gap: 10 (ref 5–15)
BUN: 23 mg/dL (ref 8–23)
CO2: 22 mmol/L (ref 22–32)
Calcium: 9.3 mg/dL (ref 8.9–10.3)
Chloride: 102 mmol/L (ref 98–111)
Creatinine, Ser: 1.31 mg/dL — ABNORMAL HIGH (ref 0.61–1.24)
GFR, Estimated: 56 mL/min — ABNORMAL LOW (ref >60)
Glucose, Bld: 95 mg/dL (ref 70–99)
Potassium: 4 mmol/L (ref 3.5–5.1)
Sodium: 134 mmol/L — ABNORMAL LOW (ref 135–145)

## 2022-08-25 LAB — LIPID PANEL
Cholesterol: 149 mg/dL (ref 0–200)
HDL: 47 mg/dL (ref >40)
LDL Cholesterol: 86 mg/dL (ref 0–99)
Total CHOL/HDL Ratio: 3.2 RATIO
Triglycerides: 78 mg/dL (ref <150)
VLDL: 16 mg/dL (ref 0–40)

## 2022-08-25 LAB — CBC
HCT: 41.3 % (ref 39.0–52.0)
Hemoglobin: 13.7 g/dL (ref 13.0–17.0)
MCH: 28.1 pg (ref 26.0–34.0)
MCHC: 33.2 g/dL (ref 30.0–36.0)
MCV: 84.6 fL (ref 80.0–100.0)
Platelets: 342 10*3/uL (ref 150–400)
RBC: 4.88 MIL/uL (ref 4.22–5.81)
RDW: 11.9 % (ref 11.5–15.5)
WBC: 7.1 10*3/uL (ref 4.0–10.5)
nRBC: 0 % (ref 0.0–0.2)

## 2022-08-25 LAB — MAGNESIUM: Magnesium: 2.1 mg/dL (ref 1.7–2.4)

## 2022-08-25 MED ORDER — APIXABAN 5 MG PO TABS
10.0000 mg | ORAL_TABLET | Freq: Two times a day (BID) | ORAL | Status: DC
  Administered 2022-08-25 – 2022-08-29 (×9): 10 mg via ORAL
  Filled 2022-08-25 (×9): qty 2

## 2022-08-25 MED ORDER — SPIRONOLACTONE 25 MG PO TABS
25.0000 mg | ORAL_TABLET | Freq: Every day | ORAL | Status: DC
  Administered 2022-08-25 – 2022-08-29 (×5): 25 mg via ORAL
  Filled 2022-08-25 (×5): qty 1

## 2022-08-25 MED ORDER — CLONIDINE HCL 0.1 MG PO TABS
0.2000 mg | ORAL_TABLET | Freq: Two times a day (BID) | ORAL | Status: DC
  Administered 2022-08-25 (×2): 0.2 mg via ORAL
  Filled 2022-08-25 (×2): qty 2

## 2022-08-25 MED ORDER — IRBESARTAN 150 MG PO TABS
150.0000 mg | ORAL_TABLET | Freq: Every day | ORAL | Status: DC
  Administered 2022-08-25 – 2022-08-29 (×5): 150 mg via ORAL
  Filled 2022-08-25 (×5): qty 1

## 2022-08-25 MED ORDER — APIXABAN 5 MG PO TABS: 5.0000 mg | ORAL_TABLET | Freq: Two times a day (BID) | ORAL | Status: DC

## 2022-08-25 MED ORDER — IOHEXOL 350 MG/ML SOLN
75.0000 mL | Freq: Once | INTRAVENOUS | Status: AC | PRN
  Administered 2022-08-25: 75 mL via INTRAVENOUS

## 2022-08-25 MED ORDER — GADOBUTROL 1 MMOL/ML IV SOLN
10.0000 mL | Freq: Once | INTRAVENOUS | Status: AC | PRN
  Administered 2022-08-25: 10 mL via INTRAVENOUS

## 2022-08-25 MED ORDER — HYDRALAZINE HCL 10 MG PO TABS
10.0000 mg | ORAL_TABLET | Freq: Three times a day (TID) | ORAL | Status: DC
  Administered 2022-08-25 – 2022-08-29 (×12): 10 mg via ORAL
  Filled 2022-08-25 (×13): qty 1

## 2022-08-25 NOTE — Discharge Instructions (Addendum)
-Take Eliquis twice daily for Pulmonary embolism -Follow up with Neurology for Stroke in 1 months -Follow up with PCP in 1-2 weeks -Obtain outpatient Cardiology referral as you will benefit from Ambulatory Cardiac monitoring.   Information on my medicine - ELIQUIS (apixaban)  This medication education was reviewed with me or my healthcare representative as part of my discharge preparation.  Why was Eliquis prescribed for you? Eliquis was prescribed to treat blood clots that may have been found in the veins of your legs (deep vein thrombosis) or in your lungs (pulmonary embolism) and to reduce the risk of them occurring again.  What do You need to know about Eliquis ? The starting dose is 10 mg (two 5 mg tablets) taken TWICE daily for the FIRST SEVEN (7) DAYS, then on 09/01/2022 the dose is reduced to ONE 5 mg tablet taken TWICE daily.  Eliquis may be taken with or without food.   Try to take the dose about the same time in the morning and in the evening. If you have difficulty swallowing the tablet whole please discuss with your pharmacist how to take the medication safely.  Take Eliquis exactly as prescribed and DO NOT stop taking Eliquis without talking to the doctor who prescribed the medication.  Stopping may increase your risk of developing a new blood clot.  Refill your prescription before you run out.  After discharge, you should have regular check-up appointments with your healthcare provider that is prescribing your Eliquis.    What do you do if you miss a dose? If a dose of ELIQUIS is not taken at the scheduled time, take it as soon as possible on the same day and twice-daily administration should be resumed. The dose should not be doubled to make up for a missed dose.  Important Safety Information A possible side effect of Eliquis is bleeding. You should call your healthcare provider right away if you experience any of the following: Bleeding from an injury or your nose  that does not stop. Unusual colored urine (red or dark brown) or unusual colored stools (red or black). Unusual bruising for unknown reasons. A serious fall or if you hit your head (even if there is no bleeding).  Some medicines may interact with Eliquis and might increase your risk of bleeding or clotting while on Eliquis. To help avoid this, consult your healthcare provider or pharmacist prior to using any new prescription or non-prescription medications, including herbals, vitamins, non-steroidal anti-inflammatory drugs (NSAIDs) and supplements.  This website has more information on Eliquis (apixaban): http://www.eliquis.com/eliquis/home   Additional Discharge Instructions   Please get your medications reviewed and adjusted by your Primary MD.  Please request your Primary MD to go over all Hospital Tests and Procedure/Radiological results at the follow up, please get all Hospital records sent to your Prim MD by signing hospital release before you go home.  If you had Pneumonia of Lung problems at the Hospital: Please get a 2 view Chest X ray done in approximately 4 weeks after hospital discharge or sooner if instructed by your Primary MD.  If you have Congestive Heart Failure: Please call your Cardiologist or Primary MD anytime you have any of the following symptoms:  1) 3 pound weight gain in 24 hours or 5 pounds in 1 week  2) shortness of breath, with or without a dry hacking cough  3) swelling in the hands, feet or stomach  4) if you have to sleep on extra pillows at night in order to  breathe  Follow cardiac low salt diet and 1.5 lit/day fluid restriction.  If you have diabetes Accuchecks 4 times/day, Once in AM empty stomach and then before each meal. Log in all results and show them to your primary doctor at your next visit. If any glucose reading is under 80 or above 300 call your primary MD immediately.  If you have Seizure/Convulsions/Epilepsy: Please do not drive,  operate heavy machinery, participate in activities at heights or participate in high speed sports until you have seen by Primary MD or a Neurologist and advised to do so again. Per [facility] statutes, patients with seizures are not allowed to drive until they have been seizure-free for six months.  Use caution when using heavy equipment or power tools. Avoid working on ladders or at heights. Take showers instead of baths. Ensure the water temperature is not too high on the home water heater. Do not go swimming alone. Do not lock yourself in a room alone (i.e. bathroom). When caring for infants or small children, sit down when holding, feeding, or changing them to minimize risk of injury to the child in the event you have a seizure. Maintain good sleep hygiene. Avoid alcohol.   If you had Gastrointestinal Bleeding: Please ask your Primary MD to check a complete blood count within one week of discharge or at your next visit. Your endoscopic/colonoscopic biopsies that are pending at the time of discharge, will also need to followed by your Primary MD.  Get Medicines reviewed and adjusted. Please take all your medications with you for your next visit with your Primary MD  Please request your Primary MD to go over all hospital tests and procedure/radiological results at the follow up, please ask your Primary MD to get all Hospital records sent to his/her office.  If you experience worsening of your admission symptoms, develop shortness of breath, life threatening emergency, suicidal or homicidal thoughts you must seek medical attention immediately by calling 911 or calling your MD immediately  if symptoms less severe.  You must read complete instructions/literature along with all the possible adverse reactions/side effects for all the Medicines you take and that have been prescribed to you. Take any new Medicines after you have completely understood and accpet all the possible adverse  reactions/side effects.   Do not drive or operate heavy machinery when taking Pain medications.   Do not take more than prescribed Pain, Sleep and Anxiety Medications  Special Instructions: If you have smoked or chewed Tobacco  in the last 2 yrs please stop smoking, stop any regular Alcohol  and or any Recreational drug use.  Wear Seat belts while driving.  Please note You were cared for by a hospitalist during your hospital stay. If you have any questions about your discharge medications or the care you received while you were in the hospital after you are discharged, you can call the unit and asked to speak with the hospitalist on call if the hospitalist that took care of you is not available. Once you are discharged, your primary care physician will handle any further medical issues. Please note that NO REFILLS for any discharge medications will be authorized once you are discharged, as it is imperative that you return to your primary care physician (or establish a relationship with a primary care physician if you do not have one) for your aftercare needs so that they can reassess your need for medications and monitor your lab values.  You can reach the hospitalist office  at phone [phone] or fax [phone]   If you do not have a primary care physician, you can call [phone] for a physician referral.  Renal cysts were noted on CT chest, abdomen and pelvis dated 08/25/2022 and 08/24/2022.  Recommend outpatient follow-up with urology and this can be coordinated with your family physician.

## 2022-08-25 NOTE — Progress Notes (Signed)
ANTICOAGULATION CONSULT NOTE - Initial Consult  Pharmacy Consult for Apixaban  Indication: pulmonary embolus  Allergies  Allergen Reactions   Flomax [Tamsulosin Hcl] Shortness Of Breath   Ace Inhibitors Other (See Comments)    Angioedema   Amlodipine Other (See Comments)    Angioedema   Doxazosin    Lipitor [Atorvastatin] Other (See Comments)    Memory loss    Patient Measurements: Height: 6' (182.9 cm) Weight: 102.8 kg (226 lb 10.1 oz) IBW/kg (Calculated) : 77.6  Vital Signs: Temp: 98.2 F (36.8 C) (01/07 1146) Temp Source: Oral (01/07 0347) BP: 138/82 (01/07 1146) Pulse Rate: 75 (01/07 1146)  Labs: Recent Labs    08/23/22 2016 08/24/22 0347 08/24/22 0557 08/25/22 0530  HGB 13.7  --  13.1 13.7  HCT 42.7  --  41.8 41.3  PLT 342  --  296 342  CREATININE 1.47*  --  1.53* 1.31*  TROPONINIHS  --  5  --   --     Estimated Creatinine Clearance: 58.6 mL/min (A) (by C-G formula based on SCr of 1.31 mg/dL (H)).   Medical History: Past Medical History:  Diagnosis Date   BPH (benign prostatic hypertrophy)    Bradycardia    evaluated by Dr. Nehemiah Massed   Cancer [facility])    prostate   Carpal tunnel syndrome, left    left arm   CKD (chronic kidney disease) stage 2, GFR 60-89 ml/min    Diverticulitis    History of prostate cancer    HNP (herniated nucleus pulposus), lumbar    Hyperlipidemia    Hypertension    IBS (irritable bowel syndrome)    Impotence    Multiple lung nodules    nonspecific, largest 48m in Jan 2016; scanned March 2017; rescan 3-6 months later   OA (osteoarthritis) of knee    right   Renal mass    evaluated; followed by urologist   Sleep apnea    on CPAP   Torn meniscus    x 2; right knee      Assessment: Pharmacy consulted for Apixaban dosing for 77yo male found to have segmental PE. Patient was admitted with acute CVA. Neurology holding antiplatelet therapy until anticoagulant treatment is completed.   Goal of Therapy:  Monitor  platelets by anticoagulation protocol: Yes   Plan:  Patient received enoxaparin 0.'5mg'$ /kg x 1 dose this morning. Spoke with provider, and he would like apixaban treatment dose to start now.   Start apixaban '10mg'$  twice daily for 7 days, followed by apixaban '5mg'$  twice daily.   SPernell Dupre PharmD, BCPS Clinical Pharmacist 08/25/2022 11:57 AM

## 2022-08-25 NOTE — Progress Notes (Signed)
OT Cancellation Note  Patient Details Name: Jared Cline MRN: [id number] DOB: 01-19-1946   Cancelled Treatment:    Reason Eval/Treat Not Completed: Other (comment) (pt is off the floor for imaging per nurse, OT will reattempt as able)  Shanon Payor, OTD OTR/L  08/25/22, 8:39 AM

## 2022-08-25 NOTE — Consult Note (Addendum)
NEUROLOGY CONSULTATION NOTE   Date of service: August 25, 2022 Patient Name: Jared Cline MRN:  [id number] DOB:  03-26-1946 Reason for consult: acute infarct Requesting physician: Dr. Gerlean Ren _ _ _   _ __   _ __ _ _  __ __   _ __   __ _  History of Present Illness   This is a 77 yo man with pmhx significant for CAD, HTN, HL, CKD 3a, OSA on CPAP with hx prostate cancer s/p prostatectomy, brought in by EMS for a fall. He has been feeling dizzy for several days. He has mild L sided weakness on exam and is otherwise asymptomatic other than recurrent dizziness when he moves. MRI (+) for acute infarct in posteroinferior pons/right cerebellar peduncle. Cerebrovascular imaging not yet performed. TTE pending. BLE [place] (-) for DVT.  Stroke Labs     Component Value Date/Time   CHOL 149 08/25/2022 0530   CHOL 174 10/19/2015 1048   TRIG 78 08/25/2022 0530   HDL 47 08/25/2022 0530   HDL 48 10/19/2015 1048   CHOLHDL 3.2 08/25/2022 0530   VLDL 16 08/25/2022 0530   LDLCALC 86 08/25/2022 0530   LDLCALC 151 (H) 10/05/2021 1357   LABVLDL 14 10/19/2015 1048    Lab Results  Component Value Date/Time   HGBA1C 5.8 (H) 02/20/2022 02:39 PM   MRI personally reviewed   ROS   Per HPI: all other systems reviewed and are negative  Past History   I have reviewed the following:  Past Medical History:  Diagnosis Date   BPH (benign prostatic hypertrophy)    Bradycardia    evaluated by Dr. Nehemiah Massed   [facility]cer [facility])    prostate   Carpal tunnel syndrome, left    left arm   CKD (chronic kidney disease) stage 2, GFR 60-89 ml/min    Diverticulitis    History of prostate cancer    HNP (herniated nucleus pulposus), lumbar    Hyperlipidemia    Hypertension    IBS (irritable bowel syndrome)    Impotence    Multiple lung nodules    nonspecific, largest 38m in Jan 2016; scanned March 2017; rescan 3-6 months later   OA (osteoarthritis) of knee    right   Renal mass    evaluated; followed by urologist    Sleep apnea    on CPAP   Torn meniscus    x 2; right knee   Past Surgical History:  Procedure Laterality Date   abdominal ultrasound  Oct 2015   Negative for eval for AAA   COLONOSCOPY     KNEE ARTHROSCOPY Right    x 2   LUMBAR LAMINECTOMY     PROSTATECTOMY     robotic   Family History  Problem Relation Age of Onset   Stroke Mother    Hypertension Mother    Aneurysm Father        cerebral   Hypertension Father    Heart disease Paternal Uncle    Alcohol abuse Brother    Cancer Neg Hx    COPD Neg Hx    Diabetes Neg Hx    Social History   Socioeconomic History   Marital status: Married    Spouse name: GAlmon Whitford  Number of children: 3   Years of education: Not on file   Highest education level: Not on file  Occupational History   Occupation: retired [employer]   Smoking status: Former    Packs/day: 0.50  Years: 9.00    Total pack years: 4.50    Types: Cigarettes    Quit date: 08/20/1971    Years since quitting: 51.0    Passive exposure: Past   Smokeless tobacco: Never  Vaping Use   Vaping Use: Never used  Substance and Sexual Activity   Alcohol use: No    Alcohol/week: 0.0 standard drinks of alcohol   Drug use: No   Sexual activity: Not Currently  Other Topics Concern   Not on file  Social History Narrative   Retired from [employer] 2012.   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    Social Determinants of Health   Financial Resource Strain: Low Risk  (07/09/2022)   Overall Financial Resource Strain (CARDIA)    Difficulty of Paying Living Expenses: Not hard at all  Food Insecurity: No Food Insecurity (08/24/2022)   Hunger Vital Sign    Worried About Running Out of Food in the Last Year: Never true    Ran Out of Food in the Last Year: Never true  Transportation Needs: No Transportation Needs (08/24/2022)   PRAPARE - Hydrologist (Medical): No    Lack of Transportation  (Non-Medical): No  Physical Activity: Inactive (07/05/2021)   Exercise Vital Sign    Days of Exercise per Week: 0 days    Minutes of Exercise per Session: 0 min  Stress: No Stress Concern Present (07/09/2022)   [place]    Feeling of Stress : Not at all  Social Connections: [place] (07/09/2022)   Social Connection and Isolation Panel [NHANES]    Frequency of Communication with Friends and Family: Three times a week    Frequency of Social Gatherings with Friends and Family: Once a week    Attends Religious Services: More than 4 times per year    Active Member of [employer] or Organizations: Not on file    Attends Music therapist: More than 4 times per year    Marital Status: Married   Allergies  Allergen Reactions   Flomax [Tamsulosin Hcl] Shortness Of Breath   Ace Inhibitors Other (See Comments)    Angioedema   Amlodipine Other (See Comments)    Angioedema   Doxazosin    Lipitor [Atorvastatin] Other (See Comments)    Memory loss    Medications   Medications Prior to Admission  Medication Sig Dispense Refill Last Dose   acetaminophen (TYLENOL) 650 MG CR tablet Take 1,300 mg by mouth every 12 (twelve) hours as needed for pain.   08/23/2022   ascorbic acid (VITAMIN C) 500 MG tablet Take 500 mg by mouth daily.   08/23/2022   aspirin EC 81 MG tablet Take 81 mg by mouth daily.   08/23/2022   cholecalciferol (VITAMIN D3) 25 MCG (1000 UNIT) tablet Take 1,000 Units by mouth daily.   08/23/2022   cloNIDine (CATAPRES) 0.2 MG tablet Take 1 tablet (0.2 mg total) by mouth 2 (two) times daily. TAKE 1 TABLET(0.2 MG) BY MOUTH TWICE DAILY 180 tablet 1 08/23/2022   cyanocobalamin (VITAMIN B12) 1000 MCG tablet Take 1,000 mcg by mouth daily.   08/23/2022   hydrALAZINE (APRESOLINE) 10 MG tablet Take 1 tablet (10 mg total) by mouth 2 (two) times daily as needed. Take if blood pressure greater than 140/90. 30 tablet 0 prn    hydrochlorothiazide (HYDRODIURIL) 12.5 MG tablet TAKE 1 TABLET(12.5 MG) BY MOUTH DAILY 90 tablet 1  08/23/2022   magnesium oxide (MAG-OX) 400 MG tablet Take 400 mg by mouth daily.   08/23/2022   meclizine (ANTIVERT) 25 MG tablet Take 25 mg by mouth 3 (three) times daily as needed for dizziness.   prn   naproxen sodium (ALEVE) 220 MG tablet Take 220 mg by mouth 2 (two) times daily as needed (pain).   08/23/2022   olmesartan (BENICAR) 20 MG tablet Take 20 mg by mouth daily.   08/23/2022   ondansetron (ZOFRAN-ODT) 4 MG disintegrating tablet Take 4 mg by mouth every 8 (eight) hours as needed for nausea or vomiting.   prn   Potassium Gluconate 550 MG TABS Take 550 mg by mouth daily.   08/23/2022   spironolactone (ALDACTONE) 25 MG tablet TAKE 1 TABLET(25 MG) BY MOUTH DAILY 90 tablet 3 08/23/2022   losartan (COZAAR) 50 MG tablet  (Patient not taking: Reported on 08/24/2022)   Not Taking   pravastatin (PRAVACHOL) 80 MG tablet Take 1 tablet (80 mg total) by mouth at bedtime. 90 tablet 3 08/22/2022      Current Facility-Administered Medications:     stroke: early stages of recovery book, , Does not apply, Once, Athena Masse, MD   0.9 %  sodium chloride infusion, , Intravenous, Continuous, Amin, Ankit Chirag, MD, Last Rate: 75 mL/hr at 08/25/22 0600, Infusion Verify at 08/25/22 0600   acetaminophen (TYLENOL) tablet 650 mg, 650 mg, Oral, Q4H PRN, 650 mg at 08/24/22 1536 **OR** acetaminophen (TYLENOL) 160 MG/5ML solution 650 mg, 650 mg, Per Tube, Q4H PRN **OR** acetaminophen (TYLENOL) suppository 650 mg, 650 mg, Rectal, Q4H PRN, Athena Masse, MD   aspirin EC tablet 81 mg, 81 mg, Oral, Daily, Athena Masse, MD, 81 mg at 08/24/22 3614   clopidogrel (PLAVIX) tablet 75 mg, 75 mg, Oral, Daily, Judd Gaudier V, MD, 75 mg at 08/24/22 0914   enoxaparin (LOVENOX) injection 52.5 mg, 0.5 mg/kg, Subcutaneous, Q24H, Judd Gaudier V, MD, 52.5 mg at 08/24/22 0913   guaiFENesin (ROBITUSSIN) 100 MG/5ML liquid 5 mL, 5 mL, Oral, Q4H  PRN, Amin, Ankit Chirag, MD   ipratropium-albuterol (DUONEB) 0.5-2.5 (3) MG/3ML nebulizer solution 3 mL, 3 mL, Nebulization, Q4H PRN, Amin, Ankit Chirag, MD   metoprolol tartrate (LOPRESSOR) injection 5 mg, 5 mg, Intravenous, Q4H PRN, Amin, Ankit Chirag, MD   ondansetron (ZOFRAN) tablet 4 mg, 4 mg, Oral, Q6H PRN **OR** ondansetron (ZOFRAN) injection 4 mg, 4 mg, Intravenous, Q6H PRN, Judd Gaudier V, MD   oxyCODONE (Oxy IR/ROXICODONE) immediate release tablet 5 mg, 5 mg, Oral, Q4H PRN, Amin, Ankit Chirag, MD   pravastatin (PRAVACHOL) tablet 80 mg, 80 mg, Oral, QHS, Duncan, Hazel V, MD   senna-docusate (Senokot-S) tablet 1 tablet, 1 tablet, Oral, QHS PRN, Amin, Ankit Chirag, MD   traZODone (DESYREL) tablet 50 mg, 50 mg, Oral, QHS PRN, Damita Lack, MD  Vitals   Vitals:   08/25/22 0026 08/25/22 0347 08/25/22 0351 08/25/22 0502  BP: (!) 173/69   (!) 173/78  Pulse: 72   74  Resp: 16   16  Temp: 98.4 F (36.9 C)   98.8 F (37.1 C)  TempSrc:  Oral    SpO2: 99%   97%  Weight:      Height:   6' (1.829 m)      Body mass index is 30.74 kg/m.  Physical Exam   Physical Exam Gen: A&O x4, NAD HEENT: Atraumatic, normocephalic;mucous membranes moist; oropharynx clear, tongue without atrophy or fasciculations. Neck: Supple, trachea midline. Resp: CTAB,  no w/r/r CV: RRR, no m/g/r; nml S1 and S2. 2+ symmetric peripheral pulses. Abd: soft/NT/ND; nabs x 4 quad Extrem: Nml bulk; no cyanosis, clubbing, or edema.  Neuro: *MS: A&O x4. Follows multi-step commands.  *Speech: fluid, nondysarthric, able to name and repeat *CN:    I: Deferred   II,III: PERRLA, VFF by confrontation, optic discs unable to be visualized 2/2 pupillary constriction   III,IV,VI: EOMI w/o nystagmus, no ptosis   V: Sensation intact from V1 to V3 to LT   VII: Eyelid closure was full.  Smile symmetric.   VIII: Hearing intact to voice   IX,X: Voice normal, palate elevates symmetrically    XI: SCM/trap 5/5 bilat   XII:  Tongue protrudes midline, no atrophy or fasciculations   *Motor:   Normal bulk.  No tremor, rigidity or bradykinesia. LUE 4+/5 delt and tricep, otherwise full strength throughout all extremities. *Sensory: Intact to light touch, pinprick, temperature vibration throughout. Symmetric. Propioception intact bilat.  No double-simultaneous extinction.  *Coordination:  Finger-to-nose, heel-to-shin, rapid alternating motions were intact. *Reflexes:  2+ and symmetric throughout without clonus; toes down-going bilat *Gait: deferred  NIHSS = 0  Premorbid mRS = 1   Labs   CBC:  Recent Labs  Lab 08/23/22 2016 08/24/22 0557 08/25/22 0530  WBC 10.0 8.3 7.1  NEUTROABS 6.5  --   --   HGB 13.7 13.1 13.7  HCT 42.7 41.8 41.3  MCV 86.6 86.9 84.6  PLT 342 296 381    Basic Metabolic Panel:  Lab Results  Component Value Date   NA 134 (L) 08/25/2022   K 4.0 08/25/2022   CO2 22 08/25/2022   GLUCOSE 95 08/25/2022   BUN 23 08/25/2022   CREATININE 1.31 (H) 08/25/2022   CALCIUM 9.3 08/25/2022   GFRNONAA 56 (L) 08/25/2022   GFRAA 57 (L) 07/21/2020   Lipid Panel:  Lab Results  Component Value Date   LDLCALC 86 08/25/2022   HgbA1c:  Lab Results  Component Value Date   HGBA1C 5.8 (H) 02/20/2022   Urine Drug Screen: No results found for: "LABOPIA", "COCAINSCRNUR", "LABBENZ", "AMPHETMU", "THCU", "LABBARB"  Alcohol Level No results found for: "ETH"   Impression   This is a 77 yo man with pmhx significant for CAD, HTN, HL, CKD 3a, OSA on CPAP with hx prostate cancer s/p prostatectomy, brought in by EMS for dizziness and found to have a pontine infarct.  Recommendations   - No indication for permissive HTN >48 hrs out from sx onset, goal normotension - MRA H&N - TTE - Check A1c and LDL + add statin per guidelines - ASA '81mg'$  daily + plavix '75mg'$  daily x21 days f/b ASA '81mg'$  daily monotherapy after that - q4 hr neuro checks - STAT head CT for any change in neuro exam - Tele - PT/OT/SLP -  Stroke education - Amb referral to neurology upon discharge   ______________________________________________________________________   Thank you for the opportunity to take part in the care of this patient. If you have any further questions, please contact the neurology consultation attending.  Signed,  Su Monks, MD [facility] [phone]  If 7pm- 7am, please page neurology on call as listed in [place].  **Any copied and pasted documentation in this note was written by me in another application not billed for and pasted by me into this document.

## 2022-08-25 NOTE — Evaluation (Signed)
Physical Therapy Evaluation Patient Details Name: Jared Cline MRN: [id number] DOB: 08-Nov-1945 Today's Date: 08/25/2022  History of Present Illness  Patient is a 77 year old male who presented to ED via EMS for fall with inability to get up. Has had several falls due to dizziness and weakness over past few days. Syncope workout was significant for age indeterminate calcified embolus on CT scan. MRI consistent with acute cerebellar stroke in posterioinferior pons/right cerebellar peduncle. PMH includes CAD, HTN, HLD, CKD 3A, OSA on CPAP, prostate cancer, complex renal cysts.   Clinical Impression  Patient is a very pleasant 77 year old male who presents with weakness and limited mobility s/p stroke. Prior to hospital admission, pt was independent and lives with his wife in a one story home with 6 stairs to enter. Patient utilizes a SPC at baseline. Family is present in room (wife, son, grandson) at time of evaluation. Co-treat performed to optimize patient care and safety.  Patient requires min A for transferring EOB, he is able to don pants with assistance. First attempt at stand patient is unstable and demonstrates need for AD. Upon standing patient reports feeling dizzy and like his legs will give out, returns to seated position. BP taken and is as follows: seated RUE 197/75, LUE 194/89; standing 212/103. Physician and nurse notified of elevated BP. Patient is highly motivated throughout session despite dizziness and is eager to return back to his independent baseline. Frequent right lateral lean noted in seated with patient able to correct with cueing. Patient returned to bed with needs met.    Pt would benefit from skilled PT to address noted impairments and functional limitations (see below for any additional details).  Upon hospital discharge, pt would benefit from CIR to return patient back to independent status.        Recommendations for follow up therapy are one component of a multi-disciplinary  discharge planning process, led by the attending physician.  Recommendations may be updated based on patient status, additional functional criteria and insurance authorization.  Follow Up Recommendations Acute inpatient rehab (3hours/day)      Assistance Recommended at Discharge Intermittent Supervision/Assistance  Patient can return home with the following  A lot of help with walking and/or transfers;A lot of help with bathing/dressing/bathroom;Assistance with cooking/housework;Assist for transportation;Help with stairs or ramp for entrance    Equipment Recommendations Rolling walker (2 wheels) (pending next placement care)  Recommendations for Other Services       Functional Status Assessment Patient has had a recent decline in their functional status and demonstrates the ability to make significant improvements in function in a reasonable and predictable amount of time.     Precautions / Restrictions Precautions Precautions: Fall Restrictions Weight Bearing Restrictions: No      Mobility  Bed Mobility Overal bed mobility: Needs Assistance Bed Mobility: Supine to Sit, Sit to Supine     Supine to sit: Min assist Sit to supine: Supervision   General bed mobility comments: min A for getting out of bed for trunk support and sequencing    Transfers Overall transfer level: Needs assistance Equipment used: Rolling walker (2 wheels) Transfers: Sit to/from Stand Sit to Stand: Min assist, Mod assist           General transfer comment: x2 person assist with RW, cues for safety and sequencing, hand placement    Ambulation/Gait               General Gait Details: not able to tolerate due to elevated  BP  Stairs            Wheelchair Mobility    Modified Rankin (Stroke Patients Only)       Balance Overall balance assessment: Needs assistance Sitting-balance support: Bilateral upper extremity supported, Feet supported Sitting balance-Leahy Scale:  Fair Sitting balance - Comments: right gradual lean; able to put on pants with frequent re-set to re-orient self to midline Postural control: Right lateral lean Standing balance support: Reliant on assistive device for balance, Bilateral upper extremity supported Standing balance-Leahy Scale: Poor Standing balance comment: Patient c/o of dizziness, BP taken                             Pertinent Vitals/Pain Pain Assessment Pain Assessment: No/denies pain    Home Living Family/patient expects to be discharged to:: Private residence Living Arrangements: Spouse/significant other Available Help at Discharge: Family;Available 24 hours/day Type of Home: House Home Access: Stairs to enter Entrance Stairs-Rails: Chemical engineer of Steps: 6   Home Layout: One level Home Equipment: Cane - quad;Cane - single point      Prior Function Prior Level of Function : History of Falls (last six months)             Mobility Comments: ind at baseline ADLs Comments: ind at baseline     Hand Dominance   Dominant Hand: Right    Extremity/Trunk Assessment   Upper Extremity Assessment Upper Extremity Assessment: Defer to OT evaluation    Lower Extremity Assessment Lower Extremity Assessment: Generalized weakness (LLE 4/5, RLE 4-/5)    Cervical / Trunk Assessment Cervical / Trunk Assessment: Normal  Communication   Communication: No difficulties  Cognition Arousal/Alertness: Awake/alert Behavior During Therapy: WFL for tasks assessed/performed Overall Cognitive Status: Within Functional Limits for tasks assessed                                 General Comments: Patient alert and oriented, joking around with family present        General Comments General comments (skin integrity, edema, etc.): BP seated: RUE 197/75, L 194/89; standing 212/103    Exercises Other Exercises Other Exercises: Patient educated on role of PT in acute care  setting, safe mobility, transfers, and CIR.   Assessment/Plan    PT Assessment Patient needs continued PT services  PT Problem List Decreased strength;Decreased activity tolerance;Decreased coordination;Decreased mobility;Decreased balance;Decreased knowledge of use of DME;Cardiopulmonary status limiting activity       PT Treatment Interventions DME instruction;Gait training;Stair training;Therapeutic exercise;Therapeutic activities;Functional mobility training;Balance training;Neuromuscular re-education;Manual techniques;Patient/family education    PT Goals (Current goals can be found in the Care Plan section)  Acute Rehab PT Goals Patient Stated Goal: to be independent again PT Goal Formulation: With patient Time For Goal Achievement: 09/08/22 Potential to Achieve Goals: Fair    Frequency 7X/week     Co-evaluation PT/OT/SLP Co-Evaluation/Treatment: Yes Reason for Co-Treatment: Complexity of the patient's impairments (multi-system involvement);For patient/therapist safety;To address functional/ADL transfers PT goals addressed during session: Mobility/safety with mobility;Balance;Proper use of DME;Strengthening/ROM OT goals addressed during session: ADL's and self-care;Proper use of Adaptive equipment and DME;Strengthening/ROM       AM-PAC PT "6 Clicks" Mobility  Outcome Measure Help needed turning from your back to your side while in a flat bed without using bedrails?: A Little Help needed moving from lying on your back to sitting on the side of a flat  bed without using bedrails?: A Little Help needed moving to and from a bed to a chair (including a wheelchair)?: A Lot Help needed standing up from a chair using your arms (e.g., wheelchair or bedside chair)?: A Lot Help needed to walk in hospital room?: A Lot Help needed climbing 3-5 steps with a railing? : Total 6 Click Score: 13    End of Session Equipment Utilized During Treatment: Gait belt Activity Tolerance: Other  (comment) (limited by elevated BP) Patient left: in bed;with family/visitor present;with bed alarm set;with call bell/phone within reach Nurse Communication: Mobility status;Other (comment) (elevated BP) PT Visit Diagnosis: Unsteadiness on feet (R26.81);Other abnormalities of gait and mobility (R26.89);Muscle weakness (generalized) (M62.81);Repeated falls (R29.6);Difficulty in walking, not elsewhere classified (R26.2);Dizziness and giddiness (R42)    Time: 1040-1109 PT Time Calculation (min) (ACUTE ONLY): 29 min   Charges:   PT Evaluation $PT Eval Moderate Complexity: 1 Mod          Janna Arch, PT, DPT  08/25/2022, 12:24 PM

## 2022-08-25 NOTE — Plan of Care (Signed)
Neurology plan of care  Please see neurology consult from yesterday for full findings and recommendations. This is a 77 yo man with pmhx significant for CAD, HTN, HL, CKD 3a, OSA on CPAP with hx prostate cancer s/p prostatectomy, brought in by EMS for dizziness and found to have a pontine infarct. Stroke workup is now completed:  MRA head  1. Extensive intracranial atherosclerosis. 2. Advanced mid basilar and bilateral PCA branch stenoses. 3. Moderate right M1 segment stenosis. 4. Occluded left vertebral artery in the neck with underestimated reconstitution of the left V4 segment when compared to postcontrast neck MRA which covers the same area.  MRA neck  1. Occluded left vertebral artery in the neck with retrograde filling of the left V4 segment and left PICA. 2. Severe atheromatous stenosis at the proximal right ICA. 50% narrowing at the proximal left ICA.  CNS imaging personally reviewed  Stroke Labs     Component Value Date/Time   CHOL 149 08/25/2022 0530   CHOL 174 10/19/2015 1048   TRIG 78 08/25/2022 0530   HDL 47 08/25/2022 0530   HDL 48 10/19/2015 1048   CHOLHDL 3.2 08/25/2022 0530   VLDL 16 08/25/2022 0530   LDLCALC 86 08/25/2022 0530   LDLCALC 151 (H) 10/05/2021 1357   LABVLDL 14 10/19/2015 1048    Lab Results  Component Value Date/Time   HGBA1C 5.8 (H) 02/20/2022 02:39 PM   Patient was found to have a segmental PE and will be started on eliquis. This is reasonable at this time given the low risk of hemorrhagic conversion in such a small stroke. The benefit of anticoagulation is felt to outweigh the risk of hemorrhagic transformation.  Final recommendations: - No indication for antiplatelets in the setting of therapeutic anticoagulation. If patient stops eliquis at some point in the future he should start taking ASA '81mg'$  daily at that time - Atorvastatin '80mg'$  daily - I will arrange neurology f/u outpatient  Neurology will be available for questions prn  going forward  Su Monks, MD [facility] [phone]  If 7pm- 7am, please page neurology on call as listed in [place].

## 2022-08-25 NOTE — Progress Notes (Signed)
   08/25/22 0900  Spiritual Encounters  Type of Visit Initial  Care provided to: Patient  Conversation partners present during encounter Nurse  Referral source Nurse (RN/NT/LPN)  Reason for visit Routine spiritual support  OnCall Visit Yes   Chaplain responded to nurse consult. Chaplain provided compassionate presence and reflective listening as patient spoke about health challenges. Patient has strong family and church support. Chaplain services are available for follow up as needed.

## 2022-08-25 NOTE — Progress Notes (Signed)
PT Cancellation Note  Patient Details Name: Jared Cline MRN: [id number] DOB: 1946-03-18   Cancelled Treatment:    Reason Eval/Treat Not Completed: Other (comment);Patient at procedure or test/unavailable (Patient consult received and reviewed, patient is off the floor for imaging, PT will reattempt as able.)   Janna Arch 08/25/2022, 8:57 AM

## 2022-08-25 NOTE — Progress Notes (Signed)
Inpatient Rehab Admissions Coordinator:  ? ?Per therapy recommendations,  patient was screened for CIR candidacy by Neviah Braud, MS, CCC-SLP. At this time, Pt. Appears to be a a potential candidate for CIR. I will place   order for rehab consult per protocol for full assessment. Please contact me any with questions. ? ?Aizlynn Digilio, MS, CCC-SLP ?Rehab Admissions Coordinator  ?[phone] (celll) ?[phone] (office) ? ?

## 2022-08-25 NOTE — Evaluation (Addendum)
Occupational Therapy Evaluation Patient Details Name: Jared Cline MRN: [id number] DOB: 1945-09-07 Today's Date: 08/25/2022   History of Present Illness Pt is a 77 year old male admitted with Punctate acute infarct in the posteroinferior pons/right cerebellar peduncle; PMH significant for CAD, HTN, HLD CKD 3A, OSA on CPAP, history of prostate cancer s/p prostatectomy, complex renal cysts followed annually by urology   Clinical Impression   Chart reviewed, nurse cleared pt for participation in OT evaluation. Noted PE on imaging, pt has received anti coagulants and MD Amin cleared/requested therapy to work with the patient on this date Co tx completed with PT. Pt is alert and oriented x4, follows one step directives with good accuracy;OT will continue to assess higher level cognition. PTA pt is MOD I-I in ADL/IADL, amb with SPC community distances. Pt presents with deficits in strength, endurance, balance, activity tolerance, FMC/dexterity, ?vision, ?perception all affecting safe and optimal ADL completion. R lateral lean noted in sitting and standing with pt requiring MIN A for supine>sit, MIN-MOD A +2 for STS with RW, three lateral steps up the bed with MIN -MOD A +2. MAX A required for LB dressing. Pt is performing below PLOF, would benefit from intensive rehab to address functional deficits and to improve functional performance. Family educated on rehab recommendation and are in agreement, OT will continue to follow acutely.     Recommendations for follow up therapy are one component of a multi-disciplinary discharge planning process, led by the attending physician.  Recommendations may be updated based on patient status, additional functional criteria and insurance authorization.   Follow Up Recommendations  Acute inpatient rehab (3hours/day)     Assistance Recommended at Discharge Frequent or constant Supervision/Assistance  Patient can return home with the following A lot of help with walking  and/or transfers;A lot of help with bathing/dressing/bathroom    Functional Status Assessment  Patient has had a recent decline in their functional status and demonstrates the ability to make significant improvements in function in a reasonable and predictable amount of time.  Equipment Recommendations  Other (comment) (per next venue of care)    Recommendations for Other Services Rehab consult     Precautions / Restrictions Precautions Precautions: Fall Restrictions Weight Bearing Restrictions: No      Mobility Bed Mobility Overal bed mobility: Needs Assistance Bed Mobility: Supine to Sit, Sit to Supine     Supine to sit: Min assist Sit to supine: Supervision   General bed mobility comments: frequent vcs for sequencing    Transfers Overall transfer level: Needs assistance Equipment used: Rolling walker (2 wheels) Transfers: Sit to/from Stand Sit to Stand: Min assist, Mod assist, +2 physical assistance, +2 safety/equipment           General transfer comment: three steps up the bed to the left with MIN-MOD A +2      Balance Overall balance assessment: Needs assistance Sitting-balance support: Bilateral upper extremity supported, Feet supported Sitting balance-Leahy Scale: Fair Sitting balance - Comments: R lateal lean Postural control: Right lateral lean Standing balance support: Reliant on assistive device for balance, Bilateral upper extremity supported Standing balance-Leahy Scale: Poor Standing balance comment: R lateral lean                           ADL either performed or assessed with clinical judgement   ADL Overall ADL's : Needs assistance/impaired                 Upper  Body Dressing : Minimal assistance;Sitting   Lower Body Dressing: Moderate assistance;Sit to/from stand Lower Body Dressing Details (indicate cue type and reason): pants     Toileting- Clothing Manipulation and Hygiene: Moderate assistance;Sit to/from stand                Vision Patient Visual Report: Nausea/blurring vision with head movement Vision Assessment?: Yes Eye Alignment: Within Functional Limits Ocular Range of Motion: Within Functional Limits Tracking/Visual Pursuits: Able to track stimulus in all quads without difficulty Visual Fields: No apparent deficits Additional Comments: will continue to assess, pt reports dizziness with upright position changes     Perception     Praxis      Pertinent Vitals/Pain Pain Assessment Pain Assessment: No/denies pain     Hand Dominance Right   Extremity/Trunk Assessment Upper Extremity Assessment Upper Extremity Assessment: Generalized weakness;RUE deficits/detail;LUE deficits/detail RUE Deficits / Details: mildly impaired FMC/dexterity B hands; strength grossly 4/5 throughout RUE Sensation: WNL RUE Coordination: decreased fine motor LUE Deficits / Details: mildly impaired FMC/dexterity B hands; strength grossly 4/5 throughout LUE Sensation: WNL LUE Coordination: decreased fine motor   Lower Extremity Assessment Lower Extremity Assessment: Defer to PT evaluation   Cervical / Trunk Assessment Cervical / Trunk Assessment: Normal   Communication Communication Communication: No difficulties   Cognition Arousal/Alertness: Awake/alert Behavior During Therapy: WFL for tasks assessed/performed Overall Cognitive Status: Impaired/Different from baseline Area of Impairment: Problem solving                             Problem Solving: Requires verbal cues, Requires tactile cues General Comments: appears WFL, will continue to assess     General Comments  BP in sitting: RUE 197/75, LUE 194/89; standing 212/103, MD notified; spo2 >95% throughout on RA    Exercises Other Exercises Other Exercises: edu pt and family re: role of OT, role of rehab, discharge recommendations, home safety, falls prevention, upright positioning in bed/chair   Shoulder Instructions       Home Living Family/patient expects to be discharged to:: Private residence Living Arrangements: Spouse/significant other Available Help at Discharge: Family;Available 24 hours/day Type of Home: House Home Access: Stairs to enter [employer] of Steps: 6 Entrance Stairs-Rails: Left;Right Home Layout: One level     Bathroom Shower/Tub: Teacher, early years/pre: Handicapped height Bathroom Accessibility: No   Home Equipment: Cane - quad;Cane - single point          Prior Functioning/Environment Prior Level of Function : History of Falls (last six months);Independent/Modified Independent             Mobility Comments: amb with spc for community distances ADLs Comments: MOD I-I in ADL/IADL, cooks,cleans,drives        OT Problem List: Decreased strength;Decreased activity tolerance;Impaired balance (sitting and/or standing);Decreased safety awareness;Decreased knowledge of use of DME or AE;Decreased cognition      OT Treatment/Interventions: Self-care/ADL training;Visual/perceptual remediation/compensation;Therapeutic exercise;Patient/family education;Balance training;Energy conservation;Therapeutic activities;DME and/or AE instruction    OT Goals(Current goals can be found in the care plan section) Acute Rehab OT Goals Patient Stated Goal: return to PLOF OT Goal Formulation: With patient/family Time For Goal Achievement: 09/08/22 Potential to Achieve Goals: Good ADL Goals Pt Will Perform Grooming: with modified independence Pt Will Perform Lower Body Dressing: with modified independence;sit to/from stand Pt Will Transfer to Toilet: with modified independence;ambulating Pt Will Perform Toileting - Clothing Manipulation and hygiene: with modified independence;sit to/from stand Pt/caregiver will Perform Home  Exercise Program: Right Upper extremity;Left upper extremity;With written HEP provided  OT Frequency: Min 2X/week    Co-evaluation PT/OT/SLP  Co-Evaluation/Treatment: Yes Reason for Co-Treatment: Complexity of the patient's impairments (multi-system involvement);Necessary to address cognition/behavior during functional activity;For patient/therapist safety;To address functional/ADL transfers PT goals addressed during session: Mobility/safety with mobility;Balance;Proper use of DME;Strengthening/ROM OT goals addressed during session: ADL's and self-care      AM-PAC OT "6 Clicks" Daily Activity     Outcome Measure Help from another person eating meals?: None Help from another person taking care of personal grooming?: A Little Help from another person toileting, which includes using toliet, bedpan, or urinal?: A Lot Help from another person bathing (including washing, rinsing, drying)?: A Lot Help from another person to put on and taking off regular upper body clothing?: A Little Help from another person to put on and taking off regular lower body clothing?: A Lot 6 Click Score: 16   End of Session Equipment Utilized During Treatment: Gait belt;Rolling walker (2 wheels) Nurse Communication: Mobility status (vital signs)  Activity Tolerance: Patient tolerated treatment well Patient left: in bed;with call bell/phone within reach;with bed alarm set;with family/visitor present  OT Visit Diagnosis: Unsteadiness on feet (R26.81);History of falling (Z91.81);Muscle weakness (generalized) (M62.81);Other symptoms and signs involving the nervous system (R29.898)                Time: 1040-1108 OT Time Calculation (min): 28 min Charges:  OT General Charges $OT Visit: 1 Visit OT Evaluation $OT Eval Moderate Complexity: 1 Mod  Shanon Payor, OTD OTR/L  08/25/22, 12:47 PM

## 2022-08-25 NOTE — Progress Notes (Signed)
PROGRESS NOTE    Jared Cline  KXF:[id number] DOB: [birth year] DOA: 08/24/2022 PCP: Bo Merino, FNP   Brief Narrative:   77 y.o. male with medical history significant for CAD, HTN, HLD CKD 3A, OSA on CPAP, history of prostate cancer s/p prostatectomy, complex renal cysts followed annually by urology, who was brought in by EMS for a fall with inability to get up.  Over the past several days patient has had dizziness and weakness resulting in multiple falls.  MRI brain was consistent with acute punctate infarct in the cerebellar region.  CT of the chest abdomen pelvis did not show any acute pathology but there was concerns for segmental pulmonary embolism.  CTA confirmed segmental pulmonary embolism therefore started on anticoagulation.   Assessment & Plan:  Principal Problem:   Acute cerebrovascular accident of cerebellum ([place]) Active Problems:   Frequent falls   Abdominal pain   Abnormal CT of the chest   Hypertension   Stage 3a chronic kidney disease (HCC)   Renal cyst   OSA on CPAP   PVD (peripheral vascular disease) (HCC)   Coronary artery disease involving native coronary artery of native heart   History of radical prostatectomy   Cerebellar stroke, acute (HCC)     Assessment and Plan: * Acute cerebrovascular accident of cerebellum ([place]) Frequent falls MRI showing Punctate acute infarct in the posteroinferior pons/right cerebellar peduncle.   LDL 86, A1c pending.  Discussed case with neurology, at this time we will start patient on Eliquis given pulmonary embolism and hold off on antiplatelet.  In the future once he has completed anticoagulation treatment, antiplatelet can be started. Currently MRA of head and neck, echocardiogram, PT/OT is pending  Abdominal pain No acute pathology.  Will continue to monitor.  Segmental pulmonary embolism, acute Will start patient on Eliquis twice daily.  History of radical prostatectomy No acute issues  Coronary artery disease  involving native coronary artery of native heart Continue aspirin, pravastatin.  Permissive hypertension  PVD (peripheral vascular disease) (HCC) Continue aspirin and pravastatin  OSA on CPAP CPAP nightly  Renal cyst Followed by urology  Stage 3a chronic kidney disease ([place]) Renal function at baseline  Hypertension Will slowly start resuming his home medications.   DVT prophylaxis: Lovenox Code Status: Full code Family Communication:  Wife at bedside  Awaiting completion of stroke evaluation.  PT/OT pending.   Subjective: Seen and examined at bedside.  Feels well, no complaints.   Examination:  Constitutional: Not in acute distress Respiratory: Clear to auscultation bilaterally Cardiovascular: Normal sinus rhythm, no rubs Abdomen: Nontender nondistended good bowel sounds Musculoskeletal: No edema noted Skin: No rashes seen Neurologic: CN 2-12 grossly intact.  And nonfocal Psychiatric: Normal judgment and insight. Alert and oriented x 3. Normal mood.  Objective: Vitals:   08/25/22 0347 08/25/22 0351 08/25/22 0502 08/25/22 0754  BP:   (!) 173/78 (!) 169/81  Pulse:   74 76  Resp:   16 16  Temp:   98.8 F (37.1 C) 98.1 F (36.7 C)  TempSrc: Oral     SpO2:   97% 97%  Weight:      Height:  6' (1.829 m)      Intake/Output Summary (Last 24 hours) at 08/25/2022 1127 Last data filed at 08/25/2022 0600 Gross per 24 hour  Intake 1524.05 ml  Output 200 ml  Net 1324.05 ml   Filed Weights   08/24/22 0704 08/24/22 2207  Weight: 102.5 kg 102.8 kg     Data Reviewed:  CBC: Recent Labs  Lab 08/23/22 2016 08/24/22 0557 08/25/22 0530  WBC 10.0 8.3 7.1  NEUTROABS 6.5  --   --   HGB 13.7 13.1 13.7  HCT 42.7 41.8 41.3  MCV 86.6 86.9 84.6  PLT 342 296 160   Basic Metabolic Panel: Recent Labs  Lab 08/23/22 2016 08/24/22 0557 08/25/22 0530  NA 134*  --  134*  K 4.3  --  4.0  CL 98  --  102  CO2 25  --  22  GLUCOSE 97  --  95  BUN 28*  --  23   CREATININE 1.47* 1.53* 1.31*  CALCIUM 9.6  --  9.3  MG  --   --  2.1   GFR: Estimated Creatinine Clearance: 58.6 mL/min (A) (by C-G formula based on SCr of 1.31 mg/dL (H)). Liver Function Tests: Recent Labs  Lab 08/24/22 0347  AST 17  ALT 9  ALKPHOS 50  BILITOT 0.9  PROT 7.2  ALBUMIN 3.9   Recent Labs  Lab 08/24/22 0347  LIPASE 28   No results for input(s): "AMMONIA" in the last 168 hours. Coagulation Profile: No results for input(s): "INR", "PROTIME" in the last 168 hours. Cardiac Enzymes: No results for input(s): "CKTOTAL", "CKMB", "CKMBINDEX", "TROPONINI" in the last 168 hours. BNP (last 3 results) No results for input(s): "PROBNP" in the last 8760 hours. HbA1C: No results for input(s): "HGBA1C" in the last 72 hours. CBG: No results for input(s): "GLUCAP" in the last 168 hours. Lipid Profile: Recent Labs    08/24/22 0910 08/25/22 0530  CHOL 138 149  HDL 44 47  LDLCALC 81 86  TRIG 65 78  CHOLHDL 3.1 3.2   Thyroid Function Tests: No results for input(s): "TSH", "T4TOTAL", "FREET4", "T3FREE", "THYROIDAB" in the last 72 hours. Anemia Panel: No results for input(s): "VITAMINB12", "FOLATE", "FERRITIN", "TIBC", "IRON", "RETICCTPCT" in the last 72 hours. Sepsis Labs: No results for input(s): "PROCALCITON", "LATICACIDVEN" in the last 168 hours.  Recent Results (from the past 240 hour(s))  Resp panel by RT-PCR (RSV, Flu A&B, Covid) Anterior Nasal Swab     Status: None   Collection Time: 08/24/22  3:47 AM   Specimen: Anterior Nasal Swab  Result Value Ref Range Status   SARS Coronavirus 2 by RT PCR NEGATIVE NEGATIVE Final    Comment: (NOTE) SARS-CoV-2 target nucleic acids are NOT DETECTED.  The SARS-CoV-2 RNA is generally detectable in upper respiratory specimens during the acute phase of infection. The lowest concentration of SARS-CoV-2 viral copies this assay can detect is 138 copies/mL. A negative result does not preclude SARS-Cov-2 infection and should not  be used as the sole basis for treatment or other patient management decisions. A negative result may occur with  improper specimen collection/handling, submission of specimen other than nasopharyngeal swab, presence of viral mutation(s) within the areas targeted by this assay, and inadequate number of viral copies(<138 copies/mL). A negative result must be combined with clinical observations, patient history, and epidemiological information. The expected result is Negative.  Fact Sheet for Patients:  EntrepreneurPulse.com.au  Fact Sheet for Healthcare Providers:  IncredibleEmployment.be  This test is no t yet approved or cleared by the [place] FDA and  has been authorized for detection and/or diagnosis of SARS-CoV-2 by FDA under an Emergency Use Authorization (EUA). This EUA will remain  in effect (meaning this test can be used) for the duration of the COVID-19 declaration under Section 564(b)(1) of the Act, 21 U.S.C.section 360bbb-3(b)(1), unless the authorization is terminated  or revoked sooner.       Influenza A by PCR NEGATIVE NEGATIVE Final   Influenza B by PCR NEGATIVE NEGATIVE Final    Comment: (NOTE) The Xpert Xpress SARS-CoV-2/FLU/RSV plus assay is intended as an aid in the diagnosis of influenza from Nasopharyngeal swab specimens and should not be used as a sole basis for treatment. Nasal washings and aspirates are unacceptable for Xpert Xpress SARS-CoV-2/FLU/RSV testing.  Fact Sheet for Patients: EntrepreneurPulse.com.au  Fact Sheet for Healthcare Providers: IncredibleEmployment.be  This test is not yet approved or cleared by the [place] FDA and has been authorized for detection and/or diagnosis of SARS-CoV-2 by FDA under an Emergency Use Authorization (EUA). This EUA will remain in effect (meaning this test can be used) for the duration of the COVID-19 declaration under Section  564(b)(1) of the Act, 21 U.S.C. section 360bbb-3(b)(1), unless the authorization is terminated or revoked.     Resp Syncytial Virus by PCR NEGATIVE NEGATIVE Final    Comment: (NOTE) Fact Sheet for Patients: EntrepreneurPulse.com.au  Fact Sheet for Healthcare Providers: IncredibleEmployment.be  This test is not yet approved or cleared by the [place] FDA and has been authorized for detection and/or diagnosis of SARS-CoV-2 by FDA under an Emergency Use Authorization (EUA). This EUA will remain in effect (meaning this test can be used) for the duration of the COVID-19 declaration under Section 564(b)(1) of the Act, 21 U.S.C. section 360bbb-3(b)(1), unless the authorization is terminated or revoked.  Performed at [facility], [address] [place], [place] [address]          Radiology Studies: MR ANGIO NECK W WO CONTRAST  Result Date: 08/25/2022 CLINICAL DATA:  Stroke in the posterior circulation EXAM: MRA NECK WITHOUT AND WITH CONTRAST TECHNIQUE: Multiplanar and multiecho pulse sequences of the neck were obtained without and with intravenous contrast. Angiographic images of the neck were obtained using MRA technique without and with intravenous contrast. CONTRAST:  42m GADAVIST GADOBUTROL 1 MMOL/ML IV SOLN COMPARISON:  Brain MRI from yesterday FINDINGS: Antegrade flow in the carotid and right vertebral circulation by time-of-flight. Partial retropharyngeal course of the bilateral ICA. On postcontrast imaging: Normal arch with 3 vessel branching. Atheromatous plaque at the proximal right ICA with high-grade stenosis. 50% narrowing at the proximal left ICA. No proximal subclavian stenosis. There is mild distal left subclavian narrowing. No detected flow in the upper left V2 and V3 segments with reconstitution at the V4 segment filling the left PICA, retrograde based on MRA of the head. IMPRESSION: 1. Occluded left vertebral artery in  the neck with retrograde filling of the left V4 segment and left PICA. 2. Severe atheromatous stenosis at the proximal right ICA. 50% narrowing at the proximal left ICA. Electronically Signed   By: JJorje GuildM.D.   On: 08/25/2022 10:43   CT Angio Chest Pulmonary Embolism (PE) W or WO Contrast  Result Date: 08/25/2022 CLINICAL DATA:  Pulmonary embolism suspected. Positive D-dimer. Status post fall. EXAM: CT ANGIOGRAPHY CHEST WITH CONTRAST TECHNIQUE: Multidetector CT imaging of the chest was performed using the standard protocol during bolus administration of intravenous contrast. Multiplanar CT image reconstructions and MIPs were obtained to evaluate the vascular anatomy. RADIATION DOSE REDUCTION: This exam was performed according to the departmental dose-optimization program which includes automated exposure control, adjustment of the mA and/or kV according to patient size and/or use of iterative reconstruction technique. CONTRAST:  104mOMNIPAQUE IOHEXOL 300 MG/ML SOLN, 754mMNIPAQUE IOHEXOL 350 MG/ML SOLN COMPARISON:  08/24/2022 FINDINGS: Cardiovascular: There  is satisfactory opacification of the pulmonary arteries to the segmental level. A filling defect is again identified within the right upper lobe pulmonary artery, image 137/6. Segmental filling defect within a branch of the left lower lobe pulmonary artery is also identified, image 244/6. Heart size is upper limits of normal. Aortic atherosclerosis and coronary artery calcifications. Mediastinum/Nodes: No enlarged mediastinal, hilar, or axillary lymph nodes. Thyroid gland, trachea, and esophagus demonstrate no significant findings. Lungs/Pleura: Pleural thickening noted along the posterior left base. Left lower lobe subsegmental atelectasis. Mild mosaic attenuation. No airspace disease. 5 mm right upper lobe pulmonary nodule is stable from 2019 and is considered benign, image 55/5. Upper Abdomen: No acute abnormality. Multiple complicated renal  cysts, reference abdominal MRI with follow-up recommendations 07/09/2021 Musculoskeletal: No chest wall abnormality. No acute or significant osseous findings. Review of the MIP images confirms the above findings. IMPRESSION: 1. Exam is positive for acute pulmonary embolus within segmental branch of the right upper lobe pulmonary artery and segmental branch of the left lower lobe pulmonary artery. 2. Mild mosaic attenuation of the lungs which may reflect small airways disease. 3. Multiple complicated renal cysts, reference abdominal MRI with follow-up recommendations 07/09/2021. 4. Coronary artery calcifications noted. 5. 5 mm right upper lobe pulmonary nodule is stable from 2019 and is considered benign. 6.  Aortic Atherosclerosis (ICD10-I70.0). Electronically Signed   By: Kerby Moors M.D.   On: 08/25/2022 08:57   US Venous Img Lower Bilateral (DVT)  Result Date: 08/24/2022 CLINICAL DATA:  77 year old male with PE EXAM: BILATERAL LOWER EXTREMITY VENOUS DOPPLER ULTRASOUND TECHNIQUE: Gray-scale sonography with graded compression, as well as color Doppler and duplex ultrasound were performed to evaluate the lower extremity deep venous systems from the level of the common femoral vein and including the common femoral, femoral, profunda femoral, popliteal and calf veins including the posterior tibial, peroneal and gastrocnemius veins when visible. The superficial great saphenous vein was also interrogated. Spectral Doppler was utilized to evaluate flow at rest and with distal augmentation maneuvers in the common femoral, femoral and popliteal veins. COMPARISON:  None Available. FINDINGS: RIGHT LOWER EXTREMITY Common Femoral Vein: No evidence of thrombus. Normal compressibility, respiratory phasicity and response to augmentation. Saphenofemoral Junction: No evidence of thrombus. Normal compressibility and flow on color Doppler imaging. Profunda Femoral Vein: No evidence of thrombus. Normal compressibility and flow  on color Doppler imaging. Femoral Vein: No evidence of thrombus. Normal compressibility, respiratory phasicity and response to augmentation. Popliteal Vein: No evidence of thrombus. Normal compressibility, respiratory phasicity and response to augmentation. Calf Veins: Posterior tibial vein patent and compressible. Peroneal vein not visualized. Superficial Great Saphenous Vein: No evidence of thrombus. Normal compressibility and flow on color Doppler imaging. Other Findings:  None. LEFT LOWER EXTREMITY Common Femoral Vein: No evidence of thrombus. Normal compressibility, respiratory phasicity and response to augmentation. Saphenofemoral Junction: No evidence of thrombus. Normal compressibility and flow on color Doppler imaging. Profunda Femoral Vein: No evidence of thrombus. Normal compressibility and flow on color Doppler imaging. Femoral Vein: No evidence of thrombus. Normal compressibility, respiratory phasicity and response to augmentation. Popliteal Vein: No evidence of thrombus. Normal compressibility, respiratory phasicity and response to augmentation. Calf Veins: Posterior tibial vein patent and compressible. Peroneal vein not visualized. Superficial Great Saphenous Vein: No evidence of thrombus. Normal compressibility and flow on color Doppler imaging. Other Findings:  None. IMPRESSION: Duplex of the bilateral lower extremities negative for DVT Signed, Dulcy Fanny. Nadene Rubins, RPVI [facility] Radiology Electronically Signed  By: Corrie Mckusick D.O.   On: 08/24/2022 11:02   CT CHEST ABDOMEN PELVIS W CONTRAST  Result Date: 08/24/2022 CLINICAL DATA:  Blunt trauma due to multiple falls over the past few days. Found on floor. Generalized weakness EXAM: CT CHEST, ABDOMEN, AND PELVIS WITH CONTRAST TECHNIQUE: Multidetector CT imaging of the chest, abdomen and pelvis was performed following the standard protocol during bolus administration of intravenous  contrast. RADIATION DOSE REDUCTION: This exam was performed according to the departmental dose-optimization program which includes automated exposure control, adjustment of the mA and/or kV according to patient size and/or use of iterative reconstruction technique. CONTRAST:  185m OMNIPAQUE IOHEXOL 300 MG/ML  SOLN COMPARISON:  04/06/2018 chest CT FINDINGS: CT CHEST FINDINGS Cardiovascular: Normal heart size. No pericardial effusion. Atheromatous calcification of the aorta and coronaries. No traumatic arterial finding. Concern for segmental filling defect within right upper lobe pulmonary artery, seen on source and reformatted images. Direct communication of recommended PE study. Mediastinum/Nodes: No hemothorax, pneumothorax, or pulmonary contusion. Lungs/Pleura: 5 mm pulmonary nodule in the right lung on 4:56 is stable from 2019 and considered benign. Musculoskeletal: Spondylosis with multi-level bridging osteophyte. CT ABDOMEN PELVIS FINDINGS Hepatobiliary: No hepatic injury or perihepatic hematoma. Gallbladder is unremarkable. Pancreas: Negative Spleen: No splenic injury or perisplenic hematoma. Adrenals/Urinary Tract: No adrenal hemorrhage or renal injury identified. Bladder is full. Bilateral renal lesions of various complexity including solid and enhancing appearing lesion at the lower pole right kidney measuring 14 mm, not significantly changed from prior MRI of the abdomen 07/09/2021 Stomach/Bowel: No visible injury Vascular/Lymphatic: Atherosclerosis with high-grade narrowing at the celiac origin, SMA patency best seen on sagittal reformats. Reproductive: No acute finding Other: No ascites or pneumoperitoneum Musculoskeletal: Generalized lumbar spine degeneration. No evidence of fracture or traumatic malalignment. IMPRESSION: 1. No evidence of injury to the chest or abdomen. 2. Equivocal for segmental pulmonary embolism in the right upper lobe, enough concern to recommend follow-up PE study when renal  function permits. 3. Multiple complicated renal cysts and solid right renal lesion, reference abdominal MRI with follow-up recommendations 07/09/2021 4. Atherosclerosis including the coronary arteries. Electronically Signed   By: JJorje GuildM.D.   On: 08/24/2022 04:25   MR BRAIN WO CONTRAST  Result Date: 08/24/2022 CLINICAL DATA:  Hypertension weakness, multiple falls EXAM: MRI HEAD WITHOUT CONTRAST TECHNIQUE: Multiplanar, multiecho pulse sequences of the brain and surrounding structures were obtained without intravenous contrast. COMPARISON:  No prior MRI available, correlation is made with CT head 08/24/2022 FINDINGS: Brain: Punctate focus of restricted diffusion with likely ADC correlate in the posteroinferior pons/right cerebellar peduncle (series 9, image 12). This area is associated with mildly increased T2 hyperintense signal. No acute hemorrhage, mass, mass effect, or midline shift. No hydrocephalus or extra-axial collection.No hemosiderin deposition to suggest remote hemorrhage.Normal pituitary and craniocervical junction.Scattered T2 hyperintense signal in the periventricular white matter and pons, likely the sequela of mild-to-moderate chronic small vessel ischemic disease. Vascular: Patent arterial flow voids. Skull and upper cervical spine: Normal marrow signal. Sinuses/Orbits: Clear paranasal sinuses.No acute finding in the orbits. Other: The mastoid air cells are well aerated. 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 These results were called by telephone at the time of interpretation on 08/24/2022 at 3:45 am to [facility] who verbally acknowledged these results. Electronically Signed   By: AMerilyn BabaM.D.   On: 08/24/2022 03:46   CT HEAD WO CONTRAST (5MM)  Result Date: 08/24/2022 CLINICAL DATA:  Hypertension, weakness, multiple falls EXAM: CT  HEAD WITHOUT CONTRAST CT CERVICAL SPINE WITHOUT CONTRAST TECHNIQUE: Multidetector CT  imaging of the head and cervical spine was performed following the standard protocol without intravenous contrast. Multiplanar CT image reconstructions of the cervical spine were also generated. RADIATION DOSE REDUCTION: This exam was performed according to the departmental dose-optimization program which includes automated exposure control, adjustment of the mA and/or kV according to patient size and/or use of iterative reconstruction technique. COMPARISON:  None Available. FINDINGS: CT HEAD FINDINGS Brain: No evidence of acute infarct, hemorrhage, mass, mass effect, or midline shift. No hydrocephalus or extra-axial fluid collection. Cerebral volume is within normal limits for age. Periventricular white matter changes, likely the sequela of chronic small vessel ischemic disease. Vascular: No hyperdense vessel. Skull: Normal. Negative for fracture or focal lesion. Sinuses/Orbits: No acute finding. Other: The mastoid air cells are well aerated. CT CERVICAL SPINE FINDINGS Alignment: Straightening of the normal cervical lordosis. 2 mm anterolisthesis of C7 on T1, which appears facet mediated. No traumatic listhesis. Skull base and vertebrae: No acute fracture. No primary bone lesion or focal pathologic process. Soft tissues and spinal canal: No prevertebral fluid or swelling. No visible canal hematoma. Disc levels: Multilevel degenerative changes, superimposed on congenitally short pedicles, which narrow the AP diameter of the spinal canal. Disc height loss, most prominent C5-C6 and C6-C7. Mild spinal canal stenosis at C3-C4, C5-C6, and C6-C7. Multilevel uncovertebral and facet arthropathy causes moderate or severe neural foraminal narrowing C3-C7. Upper chest: No focal pulmonary opacity or pleural effusion. Other: Negative. IMPRESSION: 1. No acute intracranial process. 2. No acute fracture or traumatic listhesis in the cervical spine. Electronically Signed   By: Merilyn Baba M.D.   On: 08/24/2022 02:16   CT  Cervical Spine Wo Contrast  Result Date: 08/24/2022 CLINICAL DATA:  Hypertension, weakness, multiple falls EXAM: CT HEAD WITHOUT CONTRAST CT CERVICAL SPINE WITHOUT CONTRAST TECHNIQUE: Multidetector CT imaging of the head and cervical spine was performed following the standard protocol without intravenous contrast. Multiplanar CT image reconstructions of the cervical spine were also generated. RADIATION DOSE REDUCTION: This exam was performed according to the departmental dose-optimization program which includes automated exposure control, adjustment of the mA and/or kV according to patient size and/or use of iterative reconstruction technique. COMPARISON:  None Available. FINDINGS: CT HEAD FINDINGS Brain: No evidence of acute infarct, hemorrhage, mass, mass effect, or midline shift. No hydrocephalus or extra-axial fluid collection. Cerebral volume is within normal limits for age. Periventricular white matter changes, likely the sequela of chronic small vessel ischemic disease. Vascular: No hyperdense vessel. Skull: Normal. Negative for fracture or focal lesion. Sinuses/Orbits: No acute finding. Other: The mastoid air cells are well aerated. CT CERVICAL SPINE FINDINGS Alignment: Straightening of the normal cervical lordosis. 2 mm anterolisthesis of C7 on T1, which appears facet mediated. No traumatic listhesis. Skull base and vertebrae: No acute fracture. No primary bone lesion or focal pathologic process. Soft tissues and spinal canal: No prevertebral fluid or swelling. No visible canal hematoma. Disc levels: Multilevel degenerative changes, superimposed on congenitally short pedicles, which narrow the AP diameter of the spinal canal. Disc height loss, most prominent C5-C6 and C6-C7. Mild spinal canal stenosis at C3-C4, C5-C6, and C6-C7. Multilevel uncovertebral and facet arthropathy causes moderate or severe neural foraminal narrowing C3-C7. Upper chest: No focal pulmonary opacity or pleural effusion. Other:  Negative. IMPRESSION: 1. No acute intracranial process. 2. No acute fracture or traumatic listhesis in the cervical spine. Electronically Signed   By: Merilyn Baba M.D.   On: 08/24/2022  02:16        Scheduled Meds:  apixaban  10 mg Oral BID   Followed by   Derrill Memo ON 09/01/2022] apixaban  5 mg Oral BID   cloNIDine  0.2 mg Oral BID   irbesartan  150 mg Oral Daily   pravastatin  80 mg Oral QHS   Continuous Infusions:   LOS: 0 days   Time spent= 35 mins    Ellinor Test Arsenio Loader, MD [facility]  If 7PM-7AM, please contact night-coverage  08/25/2022, 11:27 AM

## 2022-08-26 ENCOUNTER — Inpatient Hospital Stay
Admit: 2022-08-26 | Discharge: 2022-08-26 | Disposition: A | Discharge status: Home / Self Care | Payer: Medicare Other | Attending: Internal Medicine | Admitting: Internal Medicine

## 2022-08-26 DIAGNOSIS — I639 Cerebral infarction, unspecified: Secondary | ICD-10-CM | POA: Diagnosis not present

## 2022-08-26 LAB — BASIC METABOLIC PANEL
Anion gap: 10 (ref 5–15)
BUN: 19 mg/dL (ref 8–23)
CO2: 22 mmol/L (ref 22–32)
Calcium: 9.2 mg/dL (ref 8.9–10.3)
Chloride: 101 mmol/L (ref 98–111)
Creatinine, Ser: 1.2 mg/dL (ref 0.61–1.24)
GFR, Estimated: >60 mL/min (ref >60)
Glucose, Bld: 106 mg/dL — ABNORMAL HIGH (ref 70–99)
Potassium: 4.3 mmol/L (ref 3.5–5.1)
Sodium: 133 mmol/L — ABNORMAL LOW (ref 135–145)

## 2022-08-26 LAB — CBC
HCT: 39.4 % (ref 39.0–52.0)
Hemoglobin: 12.8 g/dL — ABNORMAL LOW (ref 13.0–17.0)
MCH: 27.6 pg (ref 26.0–34.0)
MCHC: 32.5 g/dL (ref 30.0–36.0)
MCV: 85.1 fL (ref 80.0–100.0)
Platelets: 319 10*3/uL (ref 150–400)
RBC: 4.63 MIL/uL (ref 4.22–5.81)
RDW: 11.7 % (ref 11.5–15.5)
WBC: 6.7 10*3/uL (ref 4.0–10.5)
nRBC: 0 % (ref 0.0–0.2)

## 2022-08-26 LAB — ECHOCARDIOGRAM COMPLETE
AR max vel: 2.55 cm2
AV Area VTI: 2.75 cm2
AV Area mean vel: 2.55 cm2
AV Mean grad: 4 mmHg
AV Peak grad: 7.5 mmHg
Ao pk vel: 1.37 m/s
Area-P 1/2: 2.28 cm2
Height: 72 in
S' Lateral: 2.7 cm
Weight: 3626.13 oz

## 2022-08-26 LAB — HEMOGLOBIN A1C
Hgb A1c MFr Bld: 5.8 % — ABNORMAL HIGH (ref 4.8–5.6)
Mean Plasma Glucose: 120 mg/dL

## 2022-08-26 LAB — MAGNESIUM: Magnesium: 2.2 mg/dL (ref 1.7–2.4)

## 2022-08-26 MED ORDER — CLONIDINE HCL 0.1 MG PO TABS
0.3000 mg | ORAL_TABLET | Freq: Two times a day (BID) | ORAL | Status: DC
  Administered 2022-08-26 – 2022-08-29 (×7): 0.3 mg via ORAL
  Filled 2022-08-26 (×7): qty 3

## 2022-08-26 NOTE — Progress Notes (Signed)
Physical Therapy Treatment Patient Details Name: Jared Cline MRN: [id number] DOB: [birth year] Today's Date: 08/26/2022   History of Present Illness Pt is a 77 year old male admitted with Punctate acute infarct in the posteroinferior pons/right cerebellar peduncle; PMH significant for CAD, HTN, HLD CKD 3A, OSA on CPAP, history of prostate cancer s/p prostatectomy, complex renal cysts followed annually by urology.    PT Comments    MD Amin cleared pt for therapy participation.  Pt resting in bed upon PT arrival; pt's daughter present; pt agreeable to therapy.  During session pt min assist semi-supine to sitting edge of bed; min to mod assist for transfers (x3 trials from bed) using RW; and min assist to take steps in place and side step to R along bed with RW use.  Mild R lean noted in sitting and standing activities today.  Pt limited with taking steps d/t R>L knee pain (pt reports h/o knee pain and is due for his injections); R knee noted to be swollen--pt reporting no different than usual.  Pt c/o dizziness swirling in his head (mild in sitting but increases significantly when standing) so focused on getting pt feeling stable in standing prior to taking any steps.  Will continue to focus on strengthening and progressing functional mobility during hospitalization.    Recommendations for follow up therapy are one component of a multi-disciplinary discharge planning process, led by the attending physician.  Recommendations may be updated based on patient status, additional functional criteria and insurance authorization.  Follow Up Recommendations  Acute inpatient rehab (3hours/day)     Assistance Recommended at Discharge Frequent or constant Supervision/Assistance  Patient can return home with the following A lot of help with walking and/or transfers;A lot of help with bathing/dressing/bathroom;Assistance with cooking/housework;Assist for transportation;Help with stairs or ramp for entrance    Equipment Recommendations  Rolling walker (2 wheels);BSC/3in1    Recommendations for Other Services       Precautions / Restrictions Precautions Precautions: Fall Restrictions Weight Bearing Restrictions: No     Mobility  Bed Mobility Overal bed mobility: Needs Assistance Bed Mobility: Supine to Sit, Sit to Supine     Supine to sit: Min assist Sit to supine: Supervision   General bed mobility comments: vc's for technique    Transfers Overall transfer level: Needs assistance Equipment used: Rolling walker (2 wheels) Transfers: Sit to/from Stand Sit to Stand: Min assist, Mod assist           General transfer comment: x3 trials standing from bed up to RW; vc's for scooting forward prior to standing and for UE/LE placement; assist to initiate stand and control descent sitting    Ambulation/Gait Ambulation/Gait assistance: Min assist Gait Distance (Feet):  (pt took steps in place 2x5 reps B LE's (sitting rest break between) and sidestepping to R along bed a few feet with RW use) Assistive device: Rolling walker (2 wheels)   Gait velocity: decreased     General Gait Details: antalgic; decreased stance time R LE (pt reports h/o R>L knee pain issues); increased effort to take steps   Stairs             Wheelchair Mobility    Modified Rankin (Stroke Patients Only)       Balance Overall balance assessment: Needs assistance Sitting-balance support: No upper extremity supported, Feet supported Sitting balance-Leahy Scale: Good Sitting balance - Comments: steady sitting reaching within BOS; mild R lean noted Postural control: Right lateral lean Standing balance support: Reliant on assistive device  for balance, Bilateral upper extremity supported, During functional activity Standing balance-Leahy Scale: Poor Standing balance comment: assist required for balance in standing; mild R lateral lean noted                            Cognition  Arousal/Alertness: Awake/alert Behavior During Therapy: WFL for tasks assessed/performed Overall Cognitive Status: Impaired/Different from baseline Area of Impairment: Problem solving                             Problem Solving: Requires verbal cues, Requires tactile cues          Exercises      General Comments  Nursing cleared pt for participation in physical therapy.  Pt agreeable to PT session.  Pt's daughter present during session.  Discussed recent PE diagnosis with MD--MD Amin cleared pt for therapy participation.      Pertinent Vitals/Pain Pain Assessment Pain Assessment: Faces Faces Pain Scale: Hurts little more (R knee (4/10 at rest; 6-8/10 with activity)) Pain Location: R knee Pain Descriptors / Indicators: Aching, Tender, Sore Pain Intervention(s): Limited activity within patient's tolerance, Monitored during session, Repositioned Vitals (HR and O2 on room air) stable and WFL throughout treatment session.    Home Living                          Prior Function            PT Goals (current goals can now be found in the care plan section) Acute Rehab PT Goals Patient Stated Goal: to be independent again PT Goal Formulation: With patient Time For Goal Achievement: 09/08/22 Potential to Achieve Goals: Good Progress towards PT goals: Progressing toward goals    Frequency    7X/week      PT Plan Current plan remains appropriate    Co-evaluation              AM-PAC PT "6 Clicks" Mobility   Outcome Measure  Help needed turning from your back to your side while in a flat bed without using bedrails?: A Little Help needed moving from lying on your back to sitting on the side of a flat bed without using bedrails?: A Little Help needed moving to and from a bed to a chair (including a wheelchair)?: A Little Help needed standing up from a chair using your arms (e.g., wheelchair or bedside chair)?: A Lot Help needed to walk in  hospital room?: A Lot Help needed climbing 3-5 steps with a railing? : Total 6 Click Score: 14    End of Session Equipment Utilized During Treatment: Gait belt Activity Tolerance: Other (comment) (limited d/t dizziness) Patient left: in bed;with call bell/phone within reach;with bed alarm set;with family/visitor present Nurse Communication: Mobility status;Precautions;Other (comment) (pt's symptoms during session) PT Visit Diagnosis: Unsteadiness on feet (R26.81);Other abnormalities of gait and mobility (R26.89);Muscle weakness (generalized) (M62.81);Repeated falls (R29.6);Difficulty in walking, not elsewhere classified (R26.2);Dizziness and giddiness (R42)     Time: 1829-9371 PT Time Calculation (min) (ACUTE ONLY): 24 min  Charges:  $Therapeutic Activity: 23-37 mins                     Leitha Bleak, PT 08/26/22, 4:52 PM

## 2022-08-26 NOTE — Progress Notes (Signed)
Inpatient Rehab Admissions Coordinator:   Left message for pt's spouse to discuss rehab recommendations and caregiver support.   Shann Medal, PT, DPT Admissions Coordinator [phone] 08/26/22  12:40 PM

## 2022-08-26 NOTE — Progress Notes (Signed)
PROGRESS NOTE    Jared Cline  WFU:[id number] DOB: 11-02-1945 DOA: 08/24/2022 PCP: Bo Merino, FNP   Brief Narrative:   77 y.o. male with medical history significant for CAD, HTN, HLD CKD 3A, OSA on CPAP, history of prostate cancer s/p prostatectomy, complex renal cysts followed annually by urology, who was brought in by EMS for a fall with inability to get up.  Over the past several days patient has had dizziness and weakness resulting in multiple falls.  MRI brain was consistent with acute punctate infarct in the cerebellar region.  CT of the chest abdomen pelvis did not show any acute pathology but there was concerns for segmental pulmonary embolism.  CTA confirmed segmental pulmonary embolism therefore started on anticoagulation.   Assessment & Plan:  Principal Problem:   Acute cerebrovascular accident of cerebellum ([place]) Active Problems:   Frequent falls   Abdominal pain   Abnormal CT of the chest   Hypertension   Stage 3a chronic kidney disease (HCC)   Renal cyst   OSA on CPAP   PVD (peripheral vascular disease) (HCC)   Coronary artery disease involving native coronary artery of native heart   History of radical prostatectomy   Cerebellar stroke, acute (HCC)   CVA (cerebral vascular accident) ([place])     Assessment and Plan: * Acute cerebrovascular accident of cerebellum ([place]) Frequent falls MRI showing Punctate acute infarct in the posteroinferior pons/right cerebellar peduncle.   LDL 86, A1c pending.  Discussed case with neurology, at this time we will start patient on Eliquis given pulmonary embolism and hold off on antiplatelet.  In the future once he has completed anticoagulation treatment, antiplatelet can be started. MRA head and neck showed extensive intracranial atherosclerosis and occluded left vertebral artery.  Neurology recommended continuing anticoagulation PE.  But eventually will need to be on antiplatelets if therapeutic anticoagulation is discontinued.   Outpatient follow-up with neurology Echocardiogram-pending PT/OT  Abdominal pain No acute pathology.  Will continue to monitor.  Segmental pulmonary embolism, acute Eliquis twice daily  History of radical prostatectomy No acute issues  Coronary artery disease involving native coronary artery of native heart Continue  pravastatin.    PVD (peripheral vascular disease) (HCC) Continue  pravastatin  OSA on CPAP CPAP nightly  Renal cyst Followed by urology  Stage 3a chronic kidney disease ([place]) Renal function at baseline  Hypertension Home meds. Adjust as needed.  IV as needed  PT/OT-CIR  DVT prophylaxis: Lovenox Code Status: Full code Family Communication: Family at bedside  Will need CIR placement Subjective: Seen and examined.  No complaints  Examination: Constitutional: Not in acute distress Respiratory: Clear to auscultation bilaterally Cardiovascular: Normal sinus rhythm, no rubs Abdomen: Nontender nondistended good bowel sounds Musculoskeletal: No edema noted Skin: No rashes seen Neurologic: CN 2-12 grossly intact.  And nonfocal Psychiatric: Normal judgment and insight. Alert and oriented x 3. Normal mood.   Objective: Vitals:   08/25/22 1604 08/25/22 2023 08/26/22 0029 08/26/22 0508  BP: (!) 144/61 (!) 158/70 (!) 163/61 (!) 169/96  Pulse: 68 78 72 72  Resp: '16 18 18 18  '$ Temp: 97.9 F (36.6 C) 98.6 F (37 C) 99.6 F (37.6 C) 98.1 F (36.7 C)  TempSrc:  Oral Oral Oral  SpO2: 100% 98% 95% 98%  Weight:      Height:       No intake or output data in the 24 hours ending 08/26/22 0745  Filed Weights   08/24/22 0704 08/24/22 2207  Weight: 102.5 kg 102.8 kg  Data Reviewed:   CBC: Recent Labs  Lab 08/23/22 2016 08/24/22 0557 08/25/22 0530 08/26/22 0553  WBC 10.0 8.3 7.1 6.7  NEUTROABS 6.5  --   --   --   HGB 13.7 13.1 13.7 12.8*  HCT 42.7 41.8 41.3 39.4  MCV 86.6 86.9 84.6 85.1  PLT 342 296 342 660   Basic Metabolic Panel: Recent  Labs  Lab 08/23/22 2016 08/24/22 0557 08/25/22 0530 08/26/22 0553  NA 134*  --  134* 133*  K 4.3  --  4.0 4.3  CL 98  --  102 101  CO2 25  --  22 22  GLUCOSE 97  --  95 106*  BUN 28*  --  23 19  CREATININE 1.47* 1.53* 1.31* 1.20  CALCIUM 9.6  --  9.3 9.2  MG  --   --  2.1 2.2   GFR: Estimated Creatinine Clearance: 63.9 mL/min (by C-G formula based on SCr of 1.2 mg/dL). Liver Function Tests: Recent Labs  Lab 08/24/22 0347  AST 17  ALT 9  ALKPHOS 50  BILITOT 0.9  PROT 7.2  ALBUMIN 3.9   Recent Labs  Lab 08/24/22 0347  LIPASE 28   No results for input(s): "AMMONIA" in the last 168 hours. Coagulation Profile: No results for input(s): "INR", "PROTIME" in the last 168 hours. Cardiac Enzymes: No results for input(s): "CKTOTAL", "CKMB", "CKMBINDEX", "TROPONINI" in the last 168 hours. BNP (last 3 results) No results for input(s): "PROBNP" in the last 8760 hours. HbA1C: No results for input(s): "HGBA1C" in the last 72 hours. CBG: No results for input(s): "GLUCAP" in the last 168 hours. Lipid Profile: Recent Labs    08/24/22 0910 08/25/22 0530  CHOL 138 149  HDL 44 47  LDLCALC 81 86  TRIG 65 78  CHOLHDL 3.1 3.2   Thyroid Function Tests: No results for input(s): "TSH", "T4TOTAL", "FREET4", "T3FREE", "THYROIDAB" in the last 72 hours. Anemia Panel: No results for input(s): "VITAMINB12", "FOLATE", "FERRITIN", "TIBC", "IRON", "RETICCTPCT" in the last 72 hours. Sepsis Labs: No results for input(s): "PROCALCITON", "LATICACIDVEN" in the last 168 hours.  Recent Results (from the past 240 hour(s))  Resp panel by RT-PCR (RSV, Flu A&B, Covid) Anterior Nasal Swab     Status: None   Collection Time: 08/24/22  3:47 AM   Specimen: Anterior Nasal Swab  Result Value Ref Range Status   SARS Coronavirus 2 by RT PCR NEGATIVE NEGATIVE Final    Comment: (NOTE) SARS-CoV-2 target nucleic acids are NOT DETECTED.  The SARS-CoV-2 RNA is generally detectable in upper  respiratory specimens during the acute phase of infection. The lowest concentration of SARS-CoV-2 viral copies this assay can detect is 138 copies/mL. A negative result does not preclude SARS-Cov-2 infection and should not be used as the sole basis for treatment or other patient management decisions. A negative result may occur with  improper specimen collection/handling, submission of specimen other than nasopharyngeal swab, presence of viral mutation(s) within the areas targeted by this assay, and inadequate number of viral copies(<138 copies/mL). A negative result must be combined with clinical observations, patient history, and epidemiological information. The expected result is Negative.  Fact Sheet for Patients:  EntrepreneurPulse.com.au  Fact Sheet for Healthcare Providers:  IncredibleEmployment.be  This test is no t yet approved or cleared by the [place] FDA and  has been authorized for detection and/or diagnosis of SARS-CoV-2 by FDA under an Emergency Use Authorization (EUA). This EUA will remain  in effect (meaning this test can be  used) for the duration of the COVID-19 declaration under Section 564(b)(1) of the Act, 21 U.S.C.section 360bbb-3(b)(1), unless the authorization is terminated  or revoked sooner.       Influenza A by PCR NEGATIVE NEGATIVE Final   Influenza B by PCR NEGATIVE NEGATIVE Final    Comment: (NOTE) The Xpert Xpress SARS-CoV-2/FLU/RSV plus assay is intended as an aid in the diagnosis of influenza from Nasopharyngeal swab specimens and should not be used as a sole basis for treatment. Nasal washings and aspirates are unacceptable for Xpert Xpress SARS-CoV-2/FLU/RSV testing.  Fact Sheet for Patients: EntrepreneurPulse.com.au  Fact Sheet for Healthcare Providers: IncredibleEmployment.be  This test is not yet approved or cleared by the [place] FDA and has been  authorized for detection and/or diagnosis of SARS-CoV-2 by FDA under an Emergency Use Authorization (EUA). This EUA will remain in effect (meaning this test can be used) for the duration of the COVID-19 declaration under Section 564(b)(1) of the Act, 21 U.S.C. section 360bbb-3(b)(1), unless the authorization is terminated or revoked.     Resp Syncytial Virus by PCR NEGATIVE NEGATIVE Final    Comment: (NOTE) Fact Sheet for Patients: EntrepreneurPulse.com.au  Fact Sheet for Healthcare Providers: IncredibleEmployment.be  This test is not yet approved or cleared by the [place] FDA and has been authorized for detection and/or diagnosis of SARS-CoV-2 by FDA under an Emergency Use Authorization (EUA). This EUA will remain in effect (meaning this test can be used) for the duration of the COVID-19 declaration under Section 564(b)(1) of the Act, 21 U.S.C. section 360bbb-3(b)(1), unless the authorization is terminated or revoked.  Performed at [facility], [address] [place], [place] [address]          Radiology Studies: MR ANGIO HEAD WO CONTRAST  Result Date: 08/25/2022 CLINICAL DATA:  Stroke workup EXAM: MRA HEAD WITHOUT CONTRAST TECHNIQUE: Angiographic images of the Circle of Willis were acquired using MRA technique without intravenous contrast. COMPARISON:  None Available. FINDINGS: Anterior circulation: Intracranial branch atherosclerosis with moderate right and mild left M1 segment stenoses. Fenestrated left distal M1 segment. Asymmetric atheromatous irregularity of the right A1 segment. Negative for aneurysm or vascular malformation. Posterior circulation: No flow seen in most of the left V4 segment and in the left PICA, although there is flow by postcontrast preceding neck MRA. Hypoplastic basilar from fetal type PCA circulation with advanced atheromatous narrowing of the mid basilar. PCA branch high-grade narrowing especially  on the left. Negative for aneurysm. IMPRESSION: 1. Extensive intracranial atherosclerosis. 2. Advanced mid basilar and bilateral PCA branch stenoses. 3. Moderate right M1 segment stenosis. 4. Occluded left vertebral artery in the neck with underestimated reconstitution of the left V4 segment when compared to postcontrast neck MRA which covers the same area. Electronically Signed   By: Jorje Guild M.D.   On: 08/25/2022 11:03   MR ANGIO NECK W WO CONTRAST  Result Date: 08/25/2022 CLINICAL DATA:  Stroke in the posterior circulation EXAM: MRA NECK WITHOUT AND WITH CONTRAST TECHNIQUE: Multiplanar and multiecho pulse sequences of the neck were obtained without and with intravenous contrast. Angiographic images of the neck were obtained using MRA technique without and with intravenous contrast. CONTRAST:  24m GADAVIST GADOBUTROL 1 MMOL/ML IV SOLN COMPARISON:  Brain MRI from yesterday FINDINGS: Antegrade flow in the carotid and right vertebral circulation by time-of-flight. Partial retropharyngeal course of the bilateral ICA. On postcontrast imaging: Normal arch with 3 vessel branching. Atheromatous plaque at the proximal right ICA with high-grade stenosis. 50% narrowing at the  proximal left ICA. No proximal subclavian stenosis. There is mild distal left subclavian narrowing. No detected flow in the upper left V2 and V3 segments with reconstitution at the V4 segment filling the left PICA, retrograde based on MRA of the head. IMPRESSION: 1. Occluded left vertebral artery in the neck with retrograde filling of the left V4 segment and left PICA. 2. Severe atheromatous stenosis at the proximal right ICA. 50% narrowing at the proximal left ICA. Electronically Signed   By: Jorje Guild M.D.   On: 08/25/2022 10:43   CT Angio Chest Pulmonary Embolism (PE) W or WO Contrast  Result Date: 08/25/2022 CLINICAL DATA:  Pulmonary embolism suspected. Positive D-dimer. Status post fall. EXAM: CT ANGIOGRAPHY CHEST WITH CONTRAST  TECHNIQUE: Multidetector CT imaging of the chest was performed using the standard protocol during bolus administration of intravenous contrast. Multiplanar CT image reconstructions and MIPs were obtained to evaluate the vascular anatomy. RADIATION DOSE REDUCTION: This exam was performed according to the departmental dose-optimization program which includes automated exposure control, adjustment of the mA and/or kV according to patient size and/or use of iterative reconstruction technique. CONTRAST:  110m OMNIPAQUE IOHEXOL 300 MG/ML SOLN, 744mOMNIPAQUE IOHEXOL 350 MG/ML SOLN COMPARISON:  08/24/2022 FINDINGS: Cardiovascular: There is satisfactory opacification of the pulmonary arteries to the segmental level. A filling defect is again identified within the right upper lobe pulmonary artery, image 137/6. Segmental filling defect within a branch of the left lower lobe pulmonary artery is also identified, image 244/6. Heart size is upper limits of normal. Aortic atherosclerosis and coronary artery calcifications. Mediastinum/Nodes: No enlarged mediastinal, hilar, or axillary lymph nodes. Thyroid gland, trachea, and esophagus demonstrate no significant findings. Lungs/Pleura: Pleural thickening noted along the posterior left base. Left lower lobe subsegmental atelectasis. Mild mosaic attenuation. No airspace disease. 5 mm right upper lobe pulmonary nodule is stable from 2019 and is considered benign, image 55/5. Upper Abdomen: No acute abnormality. Multiple complicated renal cysts, reference abdominal MRI with follow-up recommendations 07/09/2021 Musculoskeletal: No chest wall abnormality. No acute or significant osseous findings. Review of the MIP images confirms the above findings. IMPRESSION: 1. Exam is positive for acute pulmonary embolus within segmental branch of the right upper lobe pulmonary artery and segmental branch of the left lower lobe pulmonary artery. 2. Mild mosaic attenuation of the lungs which may  reflect small airways disease. 3. Multiple complicated renal cysts, reference abdominal MRI with follow-up recommendations 07/09/2021. 4. Coronary artery calcifications noted. 5. 5 mm right upper lobe pulmonary nodule is stable from 2019 and is considered benign. 6.  Aortic Atherosclerosis (ICD10-I70.0). Electronically Signed   By: TaKerby Moors.D.   On: 08/25/2022 08:57   [place] Img Lower Bilateral (DVT)  Result Date: 08/24/2022 CLINICAL DATA:  [age]ear old male with PE EXAM: BILATERAL LOWER EXTREMITY VENOUS DOPPLER ULTRASOUND TECHNIQUE: Gray-scale sonography with graded compression, as well as color Doppler and duplex ultrasound were performed to evaluate the lower extremity deep venous systems from the level of the common femoral vein and including the common femoral, femoral, profunda femoral, popliteal and calf veins including the posterior tibial, peroneal and gastrocnemius veins when visible. The superficial great saphenous vein was also interrogated. Spectral Doppler was utilized to evaluate flow at rest and with distal augmentation maneuvers in the common femoral, femoral and popliteal veins. COMPARISON:  None Available. FINDINGS: RIGHT LOWER EXTREMITY Common Femoral Vein: No evidence of thrombus. Normal compressibility, respiratory phasicity and response to augmentation. Saphenofemoral Junction: No evidence of thrombus. Normal compressibility and flow on color  Doppler imaging. Profunda Femoral Vein: No evidence of thrombus. Normal compressibility and flow on color Doppler imaging. Femoral Vein: No evidence of thrombus. Normal compressibility, respiratory phasicity and response to augmentation. Popliteal Vein: No evidence of thrombus. Normal compressibility, respiratory phasicity and response to augmentation. Calf Veins: Posterior tibial vein patent and compressible. Peroneal vein not visualized. Superficial Great Saphenous Vein: No evidence of thrombus. Normal compressibility and flow on color  Doppler imaging. Other Findings:  None. LEFT LOWER EXTREMITY Common Femoral Vein: No evidence of thrombus. Normal compressibility, respiratory phasicity and response to augmentation. Saphenofemoral Junction: No evidence of thrombus. Normal compressibility and flow on color Doppler imaging. Profunda Femoral Vein: No evidence of thrombus. Normal compressibility and flow on color Doppler imaging. Femoral Vein: No evidence of thrombus. Normal compressibility, respiratory phasicity and response to augmentation. Popliteal Vein: No evidence of thrombus. Normal compressibility, respiratory phasicity and response to augmentation. Calf Veins: Posterior tibial vein patent and compressible. Peroneal vein not visualized. Superficial Great Saphenous Vein: No evidence of thrombus. Normal compressibility and flow on color Doppler imaging. Other Findings:  None. IMPRESSION: Duplex of the bilateral lower extremities negative for DVT Signed, Dulcy Fanny. Nadene Rubins, RPVI [facility] Radiology Electronically Signed   By: Corrie Mckusick D.O.   On: 08/24/2022 11:02        Scheduled Meds:  apixaban  10 mg Oral BID   Followed by   Derrill Memo ON 09/01/2022] apixaban  5 mg Oral BID   cloNIDine  0.2 mg Oral BID   hydrALAZINE  10 mg Oral Q8H   irbesartan  150 mg Oral Daily   pravastatin  80 mg Oral QHS   spironolactone  25 mg Oral Daily   Continuous Infusions:   LOS: 1 day   Time spent= 35 mins    Jadea Shiffer Arsenio Loader, MD [facility]  If 7PM-7AM, please contact night-coverage  08/26/2022, 7:45 AM

## 2022-08-26 NOTE — Progress Notes (Signed)
*  PRELIMINARY RESULTS* Echocardiogram 2D Echocardiogram has been performed.  Sherrie Sport 08/26/2022, 10:51 AM

## 2022-08-27 ENCOUNTER — Inpatient Hospital Stay: Payer: Medicare Other

## 2022-08-27 DIAGNOSIS — I639 Cerebral infarction, unspecified: Secondary | ICD-10-CM | POA: Diagnosis not present

## 2022-08-27 LAB — BASIC METABOLIC PANEL
Anion gap: 8 (ref 5–15)
BUN: 26 mg/dL — ABNORMAL HIGH (ref 8–23)
CO2: 23 mmol/L (ref 22–32)
Calcium: 8.9 mg/dL (ref 8.9–10.3)
Chloride: 100 mmol/L (ref 98–111)
Creatinine, Ser: 1.4 mg/dL — ABNORMAL HIGH (ref 0.61–1.24)
GFR, Estimated: 52 mL/min — ABNORMAL LOW (ref >60)
Glucose, Bld: 106 mg/dL — ABNORMAL HIGH (ref 70–99)
Potassium: 4 mmol/L (ref 3.5–5.1)
Sodium: 131 mmol/L — ABNORMAL LOW (ref 135–145)

## 2022-08-27 LAB — CBC
HCT: 39 % (ref 39.0–52.0)
Hemoglobin: 12.6 g/dL — ABNORMAL LOW (ref 13.0–17.0)
MCH: 27.5 pg (ref 26.0–34.0)
MCHC: 32.3 g/dL (ref 30.0–36.0)
MCV: 85 fL (ref 80.0–100.0)
Platelets: 311 10*3/uL (ref 150–400)
RBC: 4.59 MIL/uL (ref 4.22–5.81)
RDW: 11.7 % (ref 11.5–15.5)
WBC: 6.6 10*3/uL (ref 4.0–10.5)
nRBC: 0 % (ref 0.0–0.2)

## 2022-08-27 LAB — MAGNESIUM: Magnesium: 2.1 mg/dL (ref 1.7–2.4)

## 2022-08-27 MED ORDER — SODIUM CHLORIDE 0.9 % IV BOLUS
500.0000 mL | Freq: Once | INTRAVENOUS | Status: AC
  Administered 2022-08-27: 500 mL via INTRAVENOUS

## 2022-08-27 NOTE — PMR Pre-admission (Signed)
PMR Admission Coordinator Pre-Admission Assessment  Patient: Jared Cline is an 77 y.o., male MRN: [id number] DOB: 15-Oct-1945 Height: 6' (182.9 cm) Weight: 102.8 kg  Insurance Information HMO: ***    PPO: ***     PCP: ***     IPA:      80/20:      OTHER:  PRIMARY: Blue Medicare      Policy#: [id number]       Subscriber: pt CM Name: ***      Phone#: ***     Fax#: *** Pre-Cert#: ***      Employer:  Benefits:  Phone #: ***     Name:  Eff. Date: ***     Deduct: ***      Out of Pocket Max: ***      Life Max: *** CIR: ***      SNF: *** Outpatient: ***     Co-Pay: *** Home Health: ***      Co-Pay: *** DME: ***     Co-Pay: *** Providers: *** SECONDARY:       Policy#:      Phone#:   Financial Counselor: ***      Phone#:   The "Data Collection Information Summary" for patients in Inpatient Rehabilitation Facilities with attached "Privacy Act [place] Records" was provided and verbally reviewed with: Patient  Emergency Contact Information Contact Information     Name Relation Home Work [place] A Spouse [phone]  [phone]   [place] Daughter [phone]         Current Medical History  Patient Admitting Diagnosis: CVA  History of Present Illness: Pt is a 77 y/o male with PMH of prostate can s/p prostatectomy, HTN, CAD, CKD III, OSA, and complex renal cysts who [facility] [place] with c/o fall at home.  Reports several day hx of dizziness/weakness prior to admit that resulted in multiple falls.  In ED BP 145/66, blood work unremarkable, MRI showed acute cerebellar stroke.  Neurology was consulted and recommended full workup.  MRA head/neck showed extensive intracranial atherosclerosis, advanced midbasilar and bilteral PCA stenosis, right M1 stenosis, and occluded left vertebral artery in the neck, and well as severe atheromatous stenosis at the proximal right ICA.  Pt found to have segmental PE and started on eliquis; benefit of  anticoagulation to outweigh risks.  No indication for antiplatelets in the setting of anticoagulation.  Recommend starting asa if he stops eliquis.  Therapy evaluations completed and pt was recommended for CIR.   Complete NIHSS TOTAL: 0  Patient's medical record from [facility] has been reviewed by the rehabilitation admission coordinator and physician.  Past Medical History  Past Medical History:  Diagnosis Date   BPH (benign prostatic hypertrophy)    Bradycardia    evaluated by Dr. Nehemiah Massed   Cancer [facility])    prostate   Carpal tunnel syndrome, left    left arm   CKD (chronic kidney disease) stage 2, GFR 60-89 ml/min    Diverticulitis    History of prostate cancer    HNP (herniated nucleus pulposus), lumbar    Hyperlipidemia    Hypertension    IBS (irritable bowel syndrome)    Impotence    Multiple lung nodules    nonspecific, largest 34m in Jan 2016; scanned March 2017; rescan 3-6 months later   OA (osteoarthritis) of knee    right   Renal mass    evaluated; followed by urologist   Sleep apnea    on CPAP  Torn meniscus    x 2; right knee    Has the patient had major surgery during 100 days prior to admission? No  Family History   family history includes Alcohol abuse in his brother; Aneurysm in his father; Heart disease in his paternal uncle; Hypertension in his father and mother; Stroke in his mother.  Current Medications  Current Facility-Administered Medications:    acetaminophen (TYLENOL) tablet 650 mg, 650 mg, Oral, Q4H PRN, 650 mg at 08/27/22 1019 **OR** acetaminophen (TYLENOL) 160 MG/5ML solution 650 mg, 650 mg, Per Tube, Q4H PRN **OR** acetaminophen (TYLENOL) suppository 650 mg, 650 mg, Rectal, Q4H PRN, Athena Masse, MD   apixaban (ELIQUIS) tablet 10 mg, 10 mg, Oral, BID, 10 mg at 08/27/22 1004 **FOLLOWED BY** [START ON 09/01/2022] apixaban (ELIQUIS) tablet 5 mg, 5 mg, Oral, BID, Amin, Ankit Chirag, MD   cloNIDine (CATAPRES) tablet 0.3 mg, 0.3 mg, Oral, BID, Amin,  Ankit Chirag, MD, 0.3 mg at 08/27/22 1004   guaiFENesin (ROBITUSSIN) 100 MG/5ML liquid 5 mL, 5 mL, Oral, Q4H PRN, Amin, Ankit Chirag, MD   hydrALAZINE (APRESOLINE) tablet 10 mg, 10 mg, Oral, Q8H, Amin, Ankit Chirag, MD, 10 mg at 08/27/22 0525   ipratropium-albuterol (DUONEB) 0.5-2.5 (3) MG/3ML nebulizer solution 3 mL, 3 mL, Nebulization, Q4H PRN, Amin, Ankit Chirag, MD   irbesartan (AVAPRO) tablet 150 mg, 150 mg, Oral, Daily, Amin, Ankit Chirag, MD, 150 mg at 08/27/22 1004   metoprolol tartrate (LOPRESSOR) injection 5 mg, 5 mg, Intravenous, Q4H PRN, Amin, Ankit Chirag, MD   ondansetron (ZOFRAN) tablet 4 mg, 4 mg, Oral, Q6H PRN **OR** ondansetron (ZOFRAN) injection 4 mg, 4 mg, Intravenous, Q6H PRN, Judd Gaudier V, MD   oxyCODONE (Oxy IR/ROXICODONE) immediate release tablet 5 mg, 5 mg, Oral, Q4H PRN, Amin, Ankit Chirag, MD   pravastatin (PRAVACHOL) tablet 80 mg, 80 mg, Oral, QHS, Judd Gaudier V, MD, 80 mg at 08/26/22 2118   senna-docusate (Senokot-S) tablet 1 tablet, 1 tablet, Oral, QHS PRN, Amin, Ankit Chirag, MD   spironolactone (ALDACTONE) tablet 25 mg, 25 mg, Oral, Daily, Amin, Ankit Chirag, MD, 25 mg at 08/27/22 1004   traZODone (DESYREL) tablet 50 mg, 50 mg, Oral, QHS PRN, Amin, Ankit Chirag, MD  Patients Current Diet:  Diet Order             Diet 2 gram sodium Room service appropriate? Yes; Fluid consistency: Thin  Diet effective now                   Precautions / Restrictions Precautions Precautions: Fall Restrictions Weight Bearing Restrictions: No   Has the patient had 2 or more falls or a fall with injury in the past year? Yes  Prior Activity Level Limited Community (1-2x/wk): fully independent, no DME used at home, cane in the community, driving  Prior Functional Level Self Care: Did the patient need help bathing, dressing, using the toilet or eating? Independent  Indoor Mobility: Did the patient need assistance with walking from room to room (with or without  device)? Independent  Stairs: Did the patient need assistance with internal or external stairs (with or without device)? Independent  Functional Cognition: Did the patient need help planning regular tasks such as shopping or remembering to take medications? Independent  Patient Information Are you of Hispanic, Latino/a,or Spanish origin?: A. No, not of Hispanic, Latino/a, or Spanish origin What is your race?: B. Black or African American Do you need or want an interpreter to communicate with a doctor or health  care staff?: 0. No  Patient's Response To:  Health Literacy and Transportation Is the patient able to respond to health literacy and transportation needs?: Yes Health Literacy - How often do you need to have someone help you when you read instructions, pamphlets, or other written material from your doctor or pharmacy?: Never In the past 12 months, has lack of transportation kept you from medical appointments or from getting medications?: No In the past 12 months, has lack of transportation kept you from meetings, work, or from getting things needed for daily living?: No  Home Assistive Devices / [place] Devices/Equipment: Environmental consultant (specify type) Home Equipment: Cane - quad, Cane - single point  Prior Device Use: Indicate devices/aids used by the patient prior to current illness, exacerbation or injury?  Cane in the community  Current Functional Level Cognition  Overall Cognitive Status: Impaired/Different from baseline Orientation Level: Oriented X4 General Comments: appears WFL, will continue to assess    Extremity Assessment (includes Sensation/Coordination)  Upper Extremity Assessment: Generalized weakness, RUE deficits/detail, LUE deficits/detail RUE Deficits / Details: mildly impaired FMC/dexterity B hands; strength grossly 4/5 throughout RUE Sensation: WNL RUE Coordination: decreased fine motor LUE Deficits / Details: mildly impaired FMC/dexterity B  hands; strength grossly 4/5 throughout LUE Sensation: WNL LUE Coordination: decreased fine motor  Lower Extremity Assessment: Defer to PT evaluation    ADLs  Overall ADL's : Needs assistance/impaired Upper Body Dressing : Minimal assistance, Sitting Lower Body Dressing: Moderate assistance, Sit to/from stand Lower Body Dressing Details (indicate cue type and reason): pants Toileting- Clothing Manipulation and Hygiene: Moderate assistance, Sit to/from stand    Mobility  Overal bed mobility: Needs Assistance Bed Mobility: Supine to Sit, Sit to Supine Supine to sit: Min assist Sit to supine: Supervision General bed mobility comments: vc's for technique    Transfers  Overall transfer level: Needs assistance Equipment used: Rolling walker (2 wheels) Transfers: Sit to/from Stand Sit to Stand: Min assist, Mod assist General transfer comment: x3 trials standing from bed up to RW; vc's for scooting forward prior to standing and for UE/LE placement; assist to initiate stand and control descent sitting    Ambulation / Gait / Stairs / Wheelchair Mobility  Ambulation/Gait Ambulation/Gait assistance: Herbalist (Feet):  (pt took steps in place 2x5 reps B LE's (sitting rest break between) and sidestepping to R along bed a few feet with RW use) Assistive device: Rolling walker (2 wheels) General Gait Details: antalgic; decreased stance time R LE (pt reports h/o R>L knee pain issues); increased effort to take steps Gait velocity: decreased    Posture / Balance Dynamic Sitting Balance Sitting balance - Comments: steady sitting reaching within BOS; mild R lean noted Balance Overall balance assessment: Needs assistance Sitting-balance support: No upper extremity supported, Feet supported Sitting balance-Leahy Scale: Good Sitting balance - Comments: steady sitting reaching within BOS; mild R lean noted Postural control: Right lateral lean Standing balance support: Reliant on  assistive device for balance, Bilateral upper extremity supported, During functional activity Standing balance-Leahy Scale: Poor Standing balance comment: assist required for balance in standing; mild R lateral lean noted    Special needs/care consideration N/a   Previous Home Environment (from acute therapy documentation) Living Arrangements: Spouse/significant other Available Help at Discharge: Family, Available 24 hours/day Type of Home: House Home Layout: One level Home Access: Stairs to enter Entrance Stairs-Rails: Left, Right Entrance Stairs-Number of Steps: 6 Bathroom Shower/Tub: Chiropodist: Handicapped height Bathroom Accessibility: No Home Care Services:  No  Discharge Living Setting Plans for Discharge Living Setting: Patient's home, Lives with (comment) (spouse) Type of Home at Discharge: House Discharge Home Layout: One level Discharge Home Access: Stairs to enter Entrance Stairs-Rails: Right, Left Entrance Stairs-Number of Steps: 6 Discharge Bathroom Shower/Tub: Tub/shower unit Discharge Bathroom Toilet: Handicapped height Discharge Bathroom Accessibility: No Does the patient have any problems obtaining your medications?: No  Social/Family/Support Systems Patient Roles: Spouse Anticipated Caregiver: Alver Leete Anticipated Caregiver's Contact Information: [phone] Ability/Limitations of Caregiver: supervision only Caregiver Availability: 24/7 Discharge Plan Discussed with Primary Caregiver: Yes Is Caregiver In Agreement with Plan?: Yes Does Caregiver/Family have Issues with Lodging/Transportation while Pt is in Rehab?: No  Goals Patient/Family Goal for Rehab: PT/OT/SLP supervision to mod I Expected length of stay: 10-12 days Pt/Family Agrees to Admission and willing to participate: Yes Program Orientation Provided & Reviewed with Pt/Caregiver Including Roles  & Responsibilities: Yes  Barriers to Discharge: Insurance for SNF  coverage  Decrease burden of Care through IP rehab admission: na  Possible need for SNF placement upon discharge: not anticipated  Patient Condition: I have reviewed medical records from [facility], spoken with CM, and patient. I discussed via phone for inpatient rehabilitation assessment.  Patient will benefit from ongoing PT, OT, and SLP, can actively participate in 3 hours of therapy a day 5 days of the week, and can make measurable gains during the admission.  Patient will also benefit from the coordinated team approach during an Inpatient Acute Rehabilitation admission.  The patient will receive intensive therapy as well as Rehabilitation physician, nursing, social worker, and care management interventions.  Due to safety, disease management, medication administration, pain management, and patient education the patient requires 24 hour a day rehabilitation nursing.  The patient is currently min assist with mobility and basic ADLs.  Discharge setting and therapy post discharge at home with home health is anticipated.  Patient has agreed to participate in the Acute Inpatient Rehabilitation Program and will admit pending insurance approval ***.  Preadmission Screen Completed By:  Michel Santee, 08/27/2022 10:49 AM ______________________________________________________________________   Discussed status with Dr. [name] on *** at *** and received approval for admission today.  Admission Coordinator:  Michel Santee, PT, time Marland KitchenSudie Grumbling ***   Assessment/Plan: Diagnosis: Does the need for close, 24 hr/day Medical supervision in concert with the patient's rehab needs make it unreasonable for this patient to be served in a less intensive setting? {yes_no_potentially:3041433} Co-Morbidities requiring supervision/potential complications: *** Due to {due XT:0626948}, does the patient require 24 hr/day rehab nursing? {yes_no_potentially:3041433} Does the patient require coordinated care of a physician, rehab  nurse, PT, OT, and SLP to address physical and functional deficits in the context of the above medical diagnosis(es)? {yes_no_potentially:3041433} Addressing deficits in the following areas: {deficits:3041436} Can the patient actively participate in an intensive therapy program of at least 3 hrs of therapy 5 days a week? {yes_no_potentially:3041433} The potential for patient to make measurable gains while on inpatient rehab is {potential:3041437} Anticipated functional outcomes upon discharge from inpatient rehab: {functional outcomes:304600100} PT, {functional outcomes:304600100} OT, {functional outcomes:304600100} SLP Estimated rehab length of stay to reach the above functional goals is: *** Anticipated discharge destination: {anticipated dc setting:21604} 10. Overall Rehab/Functional Prognosis: {potential:3041437}   MD Signature: ***

## 2022-08-27 NOTE — Plan of Care (Signed)
  Problem: Education: Goal: Knowledge of disease or condition will improve Outcome: Progressing Goal: Knowledge of secondary prevention will improve (MUST DOCUMENT ALL) Outcome: Progressing Goal: Knowledge of patient specific risk factors will improve [place] N/A or DELETE if not current risk factor) Outcome: Progressing   Problem: Ischemic Stroke/TIA Tissue Perfusion: Goal: Complications of ischemic stroke/TIA will be minimized Outcome: Progressing   Problem: Coping: Goal: Will verbalize positive feelings about self Outcome: Progressing Goal: Will identify appropriate support needs Outcome: Progressing   Problem: Health Behavior/Discharge Planning: Goal: Ability to manage health-related needs will improve Outcome: Progressing Goal: Goals will be collaboratively established with patient/family Outcome: Progressing   Problem: Self-Care: Goal: Ability to participate in self-care as condition permits will improve Outcome: Progressing Goal: Verbalization of feelings and concerns over difficulty with self-care will improve Outcome: Progressing Goal: Ability to communicate needs accurately will improve Outcome: Progressing   Problem: Nutrition: Goal: Risk of aspiration will decrease Outcome: Progressing Goal: Dietary intake will improve Outcome: Progressing   Problem: Education: Goal: Knowledge of General Education information will improve Description: Including pain rating scale, medication(s)/side effects and non-pharmacologic comfort measures Outcome: Progressing   Problem: Health Behavior/Discharge Planning: Goal: Ability to manage health-related needs will improve Outcome: Progressing   Problem: Clinical Measurements: Goal: Ability to maintain clinical measurements within normal limits will improve Outcome: Progressing Goal: Will remain free from infection Outcome: Progressing Goal: Diagnostic test results will improve Outcome: Progressing Goal: Respiratory  complications will improve Outcome: Progressing Goal: Cardiovascular complication will be avoided Outcome: Progressing   Problem: Activity: Goal: Risk for activity intolerance will decrease Outcome: Progressing   Problem: Nutrition: Goal: Adequate nutrition will be maintained Outcome: Progressing   Problem: Coping: Goal: Level of anxiety will decrease Outcome: Progressing   Problem: Elimination: Goal: Will not experience complications related to bowel motility Outcome: Progressing Goal: Will not experience complications related to urinary retention Outcome: Progressing   Problem: Pain Managment: Goal: General experience of comfort will improve Outcome: Progressing   Problem: Safety: Goal: Ability to remain free from injury will improve Outcome: Progressing   Problem: Skin Integrity: Goal: Risk for impaired skin integrity will decrease Outcome: Progressing   Problem: Education: Goal: Knowledge of disease or condition will improve Outcome: Progressing Goal: Knowledge of secondary prevention will improve (MUST DOCUMENT ALL) Outcome: Progressing Goal: Knowledge of patient specific risk factors will improve [place] N/A or DELETE if not current risk factor) Outcome: Progressing   Problem: Ischemic Stroke/TIA Tissue Perfusion: Goal: Complications of ischemic stroke/TIA will be minimized Outcome: Progressing   Problem: Coping: Goal: Will verbalize positive feelings about self Outcome: Progressing Goal: Will identify appropriate support needs Outcome: Progressing   Problem: Health Behavior/Discharge Planning: Goal: Ability to manage health-related needs will improve Outcome: Progressing Goal: Goals will be collaboratively established with patient/family Outcome: Progressing   Problem: Self-Care: Goal: Ability to participate in self-care as condition permits will improve Outcome: Progressing Goal: Verbalization of feelings and concerns over difficulty with self-care  will improve Outcome: Progressing Goal: Ability to communicate needs accurately will improve Outcome: Progressing   Problem: Nutrition: Goal: Risk of aspiration will decrease Outcome: Progressing Goal: Dietary intake will improve Outcome: Progressing

## 2022-08-27 NOTE — Progress Notes (Signed)
PROGRESS NOTE    Jared Cline  OBS:[id number] DOB: May 09, 1946 DOA: 08/24/2022 PCP: Bo Merino, FNP   Brief Narrative:   77 y.o. male with medical history significant for CAD, HTN, HLD CKD 3A, OSA on CPAP, history of prostate cancer s/p prostatectomy, complex renal cysts followed annually by urology, who was brought in by EMS for a fall with inability to get up.  Over the past several days patient has had dizziness and weakness resulting in multiple falls.  MRI brain was consistent with acute punctate infarct in the cerebellar region.  CT of the chest abdomen pelvis did not show any acute pathology but there was concerns for segmental pulmonary embolism.  CTA confirmed segmental pulmonary embolism therefore started on anticoagulation.  PT/OT recommended CIR.   Assessment & Plan:  Principal Problem:   Acute cerebrovascular accident of cerebellum ([place]) Active Problems:   Frequent falls   Abdominal pain   Abnormal CT of the chest   Hypertension   Stage 3a chronic kidney disease (HCC)   Renal cyst   OSA on CPAP   PVD (peripheral vascular disease) (HCC)   Coronary artery disease involving native coronary artery of native heart   History of radical prostatectomy   Cerebellar stroke, acute (HCC)   CVA (cerebral vascular accident) ([place])     Assessment and Plan: * Acute cerebrovascular accident of cerebellum ([employer]) Frequent falls MRI showing Punctate acute infarct in the posteroinferior pons/right cerebellar peduncle.   LDL 86, A1c pending.  Discussed case with neurology, at this time we will start patient on Eliquis given pulmonary embolism and hold off on antiplatelet.  In the future once he has completed anticoagulation treatment, antiplatelet can be started. MRA head and neck showed extensive intracranial atherosclerosis and occluded left vertebral artery.  Neurology recommended continuing anticoagulation PE.  But eventually will need to be on antiplatelets if therapeutic anticoagulation  is discontinued.  Outpatient follow-up with neurology Echocardiogram-70% EF, grade 1 DD PT/OT-CIR  Right knee pain and swelling X-ray ordered.    Segmental pulmonary embolism, acute Eliquis twice daily  History of radical prostatectomy No acute issues  Coronary artery disease involving native coronary artery of native heart Continue  pravastatin.    PVD (peripheral vascular disease) (HCC) Continue  pravastatin  OSA on CPAP CPAP nightly  Renal cyst Followed by urology  Stage 3a chronic kidney disease ([place]) Renal function at baseline.  Very slight bump in creatinine therefore one-time 100 cc normal saline bolus ordered  Hypertension Home meds. Adjust as needed.  IV as needed  PT/OT-CIR  DVT prophylaxis: Lovenox Code Status: Full code Family Communication: Wife at bedside  Will need CIR placement Subjective: Reporting of right knee pain and swelling causing limited flexion.  Denies any other complaints.  Examination: Constitutional: Not in acute distress Respiratory: Clear to auscultation bilaterally Cardiovascular: Normal sinus rhythm, no rubs Abdomen: Nontender nondistended good bowel sounds Musculoskeletal: No edema noted.  Slight swelling noted of the right knee when compared to left. Skin: No rashes seen Neurologic: CN 2-12 grossly intact.  And nonfocal Psychiatric: Normal judgment and insight. Alert and oriented x 3. Normal mood.  Objective: Vitals:   08/27/22 0034 08/27/22 0525 08/27/22 0826 08/27/22 0830  BP: (!) 125/51 (!) 133/58 132/64 (!) 145/81  Pulse: 68  77 77  Resp: '16  18 18  '$ Temp: 98 F (36.7 C)  97.7 F (36.5 C) 99.6 F (37.6 C)  TempSrc: Oral     SpO2: 96%  100% 96%  Weight:  Height:       No intake or output data in the 24 hours ending 08/27/22 1150  Filed Weights   08/24/22 0704 08/24/22 2207  Weight: 102.5 kg 102.8 kg     Data Reviewed:   CBC: Recent Labs  Lab 08/23/22 2016 08/24/22 0557 08/25/22 0530  08/26/22 0553 08/27/22 0512  WBC 10.0 8.3 7.1 6.7 6.6  NEUTROABS 6.5  --   --   --   --   HGB 13.7 13.1 13.7 12.8* 12.6*  HCT 42.7 41.8 41.3 39.4 39.0  MCV 86.6 86.9 84.6 85.1 85.0  PLT 342 296 342 319 500   Basic Metabolic Panel: Recent Labs  Lab 08/23/22 2016 08/24/22 0557 08/25/22 0530 08/26/22 0553 08/27/22 0512  NA 134*  --  134* 133* 131*  K 4.3  --  4.0 4.3 4.0  CL 98  --  102 101 100  CO2 25  --  '22 22 23  '$ GLUCOSE 97  --  95 106* 106*  BUN 28*  --  23 19 26*  CREATININE 1.47* 1.53* 1.31* 1.20 1.40*  CALCIUM 9.6  --  9.3 9.2 8.9  MG  --   --  2.1 2.2 2.1   GFR: Estimated Creatinine Clearance: 54.8 mL/min (A) (by C-G formula based on SCr of 1.4 mg/dL (H)). Liver Function Tests: Recent Labs  Lab 08/24/22 0347  AST 17  ALT 9  ALKPHOS 50  BILITOT 0.9  PROT 7.2  ALBUMIN 3.9   Recent Labs  Lab 08/24/22 0347  LIPASE 28   No results for input(s): "AMMONIA" in the last 168 hours. Coagulation Profile: No results for input(s): "INR", "PROTIME" in the last 168 hours. Cardiac Enzymes: No results for input(s): "CKTOTAL", "CKMB", "CKMBINDEX", "TROPONINI" in the last 168 hours. BNP (last 3 results) No results for input(s): "PROBNP" in the last 8760 hours. HbA1C: No results for input(s): "HGBA1C" in the last 72 hours. CBG: No results for input(s): "GLUCAP" in the last 168 hours. Lipid Profile: Recent Labs    08/25/22 0530  CHOL 149  HDL 47  LDLCALC 86  TRIG 78  CHOLHDL 3.2   Thyroid Function Tests: No results for input(s): "TSH", "T4TOTAL", "FREET4", "T3FREE", "THYROIDAB" in the last 72 hours. Anemia Panel: No results for input(s): "VITAMINB12", "FOLATE", "FERRITIN", "TIBC", "IRON", "RETICCTPCT" in the last 72 hours. Sepsis Labs: No results for input(s): "PROCALCITON", "LATICACIDVEN" in the last 168 hours.  Recent Results (from the past 240 hour(s))  Resp panel by RT-PCR (RSV, Flu A&B, Covid) Anterior Nasal Swab     Status: None   Collection Time:  08/24/22  3:47 AM   Specimen: Anterior Nasal Swab  Result Value Ref Range Status   SARS Coronavirus 2 by RT PCR NEGATIVE NEGATIVE Final    Comment: (NOTE) SARS-CoV-2 target nucleic acids are NOT DETECTED.  The SARS-CoV-2 RNA is generally detectable in upper respiratory specimens during the acute phase of infection. The lowest concentration of SARS-CoV-2 viral copies this assay can detect is 138 copies/mL. A negative result does not preclude SARS-Cov-2 infection and should not be used as the sole basis for treatment or other patient management decisions. A negative result may occur with  improper specimen collection/handling, submission of specimen other than nasopharyngeal swab, presence of viral mutation(s) within the areas targeted by this assay, and inadequate number of viral copies(<138 copies/mL). A negative result must be combined with clinical observations, patient history, and epidemiological information. The expected result is Negative.  Fact Sheet for Patients:  EntrepreneurPulse.com.au  Fact Sheet for Healthcare Providers:  IncredibleEmployment.be  This test is no t yet approved or cleared by the [place] FDA and  has been authorized for detection and/or diagnosis of SARS-CoV-2 by FDA under an Emergency Use Authorization (EUA). This EUA will remain  in effect (meaning this test can be used) for the duration of the COVID-19 declaration under Section 564(b)(1) of the Act, 21 U.S.C.section 360bbb-3(b)(1), unless the authorization is terminated  or revoked sooner.       Influenza A by PCR NEGATIVE NEGATIVE Final   Influenza B by PCR NEGATIVE NEGATIVE Final    Comment: (NOTE) The Xpert Xpress SARS-CoV-2/FLU/RSV plus assay is intended as an aid in the diagnosis of influenza from Nasopharyngeal swab specimens and should not be used as a sole basis for treatment. Nasal washings and aspirates are unacceptable for Xpert Xpress  SARS-CoV-2/FLU/RSV testing.  Fact Sheet for Patients: EntrepreneurPulse.com.au  Fact Sheet for Healthcare Providers: IncredibleEmployment.be  This test is not yet approved or cleared by the [place] FDA and has been authorized for detection and/or diagnosis of SARS-CoV-2 by FDA under an Emergency Use Authorization (EUA). This EUA will remain in effect (meaning this test can be used) for the duration of the COVID-19 declaration under Section 564(b)(1) of the Act, 21 U.S.C. section 360bbb-3(b)(1), unless the authorization is terminated or revoked.     Resp Syncytial Virus by PCR NEGATIVE NEGATIVE Final    Comment: (NOTE) Fact Sheet for Patients: EntrepreneurPulse.com.au  Fact Sheet for Healthcare Providers: IncredibleEmployment.be  This test is not yet approved or cleared by the [place] FDA and has been authorized for detection and/or diagnosis of SARS-CoV-2 by FDA under an Emergency Use Authorization (EUA). This EUA will remain in effect (meaning this test can be used) for the duration of the COVID-19 declaration under Section 564(b)(1) of the Act, 21 U.S.C. section 360bbb-3(b)(1), unless the authorization is terminated or revoked.  Performed at [facility], [address], [place], [place] [address]          Radiology Studies: ECHOCARDIOGRAM COMPLETE  Result Date: 08/26/2022    ECHOCARDIOGRAM REPORT   Patient Name:   TAMON Bogden Date of Exam: 08/26/2022 Medical Rec #:  [id number]   Height:       72.0 in Accession #:    [id number]  Weight:       226.6 lb Date of Birth:  Apr 11, 1946    BSA:          2.247 m Patient Age:    55 years    BP:           151/77 mmHg Patient Gender: M           HR:           78 bpm. Exam Location:  [facility] Procedure: 2D Echo, Cardiac Doppler and Color Doppler Indications:     Stroke I63.9  History:         Patient has no prior history of Echocardiogram  examinations.                  Risk Factors:Hypertension and Sleep Apnea.  Sonographer:     Sherrie Sport Referring Phys:  [id number] Athena Masse Diagnosing Phys: Yolonda Kida MD  Sonographer Comments: Suboptimal apical window. IMPRESSIONS  1. Borderline Focal Inferior Lateral hypo.  2. Left ventricular ejection fraction, by estimation, is 65 to 70%. The left ventricle has normal function. The left ventricle has no regional wall motion abnormalities. There is severe  concentric left ventricular hypertrophy. Left ventricular diastolic  parameters are consistent with Grade I diastolic dysfunction (impaired relaxation).  3. Right ventricular systolic function is normal. The right ventricular size is normal.  4. The mitral valve is normal in structure. Trivial mitral valve regurgitation.  5. The aortic valve is normal in structure. Aortic valve regurgitation is not visualized. FINDINGS  Left Ventricle: Left ventricular ejection fraction, by estimation, is 65 to 70%. The left ventricle has normal function. The left ventricle has no regional wall motion abnormalities. The left ventricular internal cavity size was normal in size. There is  severe concentric left ventricular hypertrophy. Left ventricular diastolic parameters are consistent with Grade I diastolic dysfunction (impaired relaxation). Right Ventricle: The right ventricular size is normal. No increase in right ventricular wall thickness. Right ventricular systolic function is normal. Left Atrium: Left atrial size was normal in size. Right Atrium: Right atrial size was normal in size. Pericardium: There is no evidence of pericardial effusion. Mitral Valve: The mitral valve is normal in structure. Trivial mitral valve regurgitation. Tricuspid Valve: The tricuspid valve is normal in structure. Tricuspid valve regurgitation is trivial. Aortic Valve: The aortic valve is normal in structure. Aortic valve regurgitation is not visualized. Aortic valve mean gradient  measures 4.0 mmHg. Aortic valve peak gradient measures 7.5 mmHg. Aortic valve area, by VTI measures 2.75 cm. Pulmonic Valve: The pulmonic valve was normal in structure. Pulmonic valve regurgitation is not visualized. Aorta: The ascending aorta was not well visualized. IAS/Shunts: No atrial level shunt detected by color flow Doppler. Additional Comments: Borderline Focal Inferior Lateral hypo.  LEFT VENTRICLE PLAX 2D LVIDd:         4.20 cm   Diastology LVIDs:         2.70 cm   LV e' medial:    4.24 cm/s LV PW:         1.80 cm   LV E/e' medial:  14.5 LV IVS:        1.90 cm   LV e' lateral:   9.36 cm/s LVOT diam:     2.10 cm   LV E/e' lateral: 6.5 LV SV:         57 LV SV Index:   26 LVOT Area:     3.46 cm  RIGHT VENTRICLE RV S prime:     13.70 cm/s TAPSE (M-mode): 2.1 cm LEFT ATRIUM             Index        RIGHT ATRIUM           Index LA diam:        3.20 cm 1.42 cm/m   RA Area:     16.90 cm LA Vol (A2C):   68.6 ml 30.53 ml/m  RA Volume:   48.40 ml  21.54 ml/m LA Vol (A4C):   25.0 ml 11.13 ml/m LA Biplane Vol: 41.3 ml 18.38 ml/m  AORTIC VALVE AV Area (Vmax):    2.55 cm AV Area (Vmean):   2.55 cm AV Area (VTI):     2.75 cm AV Vmax:           137.00 cm/s AV Vmean:          89.400 cm/s AV VTI:            0.209 m AV Peak Grad:      7.5 mmHg AV Mean Grad:      4.0 mmHg LVOT Vmax:         101.00 cm/s  LVOT Vmean:        65.800 cm/s LVOT VTI:          0.166 m LVOT/AV VTI ratio: 0.79  AORTA Ao Root diam: 3.80 cm MITRAL VALVE               TRICUSPID VALVE MV Area (PHT): 2.28 cm    TR Peak grad:   14.3 mmHg MV Decel Time: 333 msec    TR Vmax:        189.00 cm/s MV E velocity: 61.30 cm/s MV A velocity: 91.70 cm/s  SHUNTS MV E/A ratio:  0.67        Systemic VTI:  0.17 m                            Systemic Diam: 2.10 cm Dwayne D Callwood MD Electronically signed by Yolonda Kida MD Signature Date/Time: 08/26/2022/10:18:58 PM    Final         Scheduled Meds:  apixaban  10 mg Oral BID   Followed by   Derrill Memo ON  09/01/2022] apixaban  5 mg Oral BID   cloNIDine  0.3 mg Oral BID   hydrALAZINE  10 mg Oral Q8H   irbesartan  150 mg Oral Daily   pravastatin  80 mg Oral QHS   spironolactone  25 mg Oral Daily   Continuous Infusions:   LOS: 2 days   Time spent= 35 mins    Raseel Jans Arsenio Loader, MD [facility]  If 7PM-7AM, please contact night-coverage  08/27/2022, 11:50 AM

## 2022-08-27 NOTE — Progress Notes (Signed)
Occupational Therapy Treatment Patient Details Name: Jared Cline MRN: [id number] DOB: 27-Jan-1946 Today's Date: 08/27/2022   History of present illness Pt is a 76 year old male admitted with Punctate acute infarct in the posteroinferior pons/right cerebellar peduncle; PMH significant for CAD, HTN, HLD CKD 3A, OSA on CPAP, history of prostate cancer s/p prostatectomy, complex renal cysts followed annually by urology.   OT comments  Jared Cline was seen for OT treatment on this date. Upon arrival to room pt reclined in chair, agreeable to tx. Pt reports fatigue and R knee pain, requests to return to bed. Pt requires MOD A + HHA chair>bed SPT, cues for sequencing. SBA don/doff B socks seated EOB. Blue theraputty provided and exercises reviewed, cues to complete with R hand.  Pt making good progress toward goals, will continue to follow POC. Discharge recommendation remains appropriate.     Recommendations for follow up therapy are one component of a multi-disciplinary discharge planning process, led by the attending physician.  Recommendations may be updated based on patient status, additional functional criteria and insurance authorization.    Follow Up Recommendations  Acute inpatient rehab (3hours/day)     Assistance Recommended at Discharge Frequent or constant Supervision/Assistance  Patient can return home with the following  A lot of help with walking and/or transfers;A lot of help with bathing/dressing/bathroom   Equipment Recommendations  Other (comment) (defer)    Recommendations for Other Services      Precautions / Restrictions Precautions Precautions: Fall Restrictions Weight Bearing Restrictions: No       Mobility Bed Mobility Overal bed mobility: Needs Assistance Bed Mobility: Sit to Supine       Sit to supine: Min guard, HOB elevated        Transfers Overall transfer level: Needs assistance Equipment used: 1 person hand held assist Transfers: Sit to/from Stand,  Bed to chair/wheelchair/BSC Sit to Stand: Min assist     Step pivot transfers: Mod assist     General transfer comment: assist for sequencing turns and balance     Balance Overall balance assessment: Needs assistance Sitting-balance support: No upper extremity supported, Feet supported Sitting balance-Leahy Scale: Good     Standing balance support: Single extremity supported, During functional activity Standing balance-Leahy Scale: Poor                             ADL either performed or assessed with clinical judgement   ADL Overall ADL's : Needs assistance/impaired                                       General ADL Comments: MOD A simulated BSC t/f. SBA don/doff socks seated EOB.      Cognition Arousal/Alertness: Awake/alert Behavior During Therapy: WFL for tasks assessed/performed Overall Cognitive Status: Within Functional Limits for tasks assessed                                          Exercises  Blue theraputty provided and HEP initiated             Pertinent Vitals/ Pain       Pain Assessment Pain Assessment: 0-10 Pain Score: 6  Pain Location: R knee Pain Descriptors / Indicators: Aching, Tender, Sore Pain Intervention(s): Limited activity within  patient's tolerance, Patient requesting pain meds-RN notified, RN gave pain meds during session   Frequency  Min 4X/week        Progress Toward Goals  OT Goals(current goals can now be found in the care plan section)  Progress towards OT goals: Progressing toward goals  Acute Rehab OT Goals Patient Stated Goal: to go to [facility] OT Goal Formulation: With patient/family Time For Goal Achievement: 09/08/22 Potential to Achieve Goals: Good ADL Goals Pt Will Perform Grooming: with modified independence Pt Will Perform Lower Body Dressing: with modified independence;sit to/from stand Pt Will Transfer to Toilet: with modified independence;ambulating Pt Will  Perform Toileting - Clothing Manipulation and hygiene: with modified independence;sit to/from stand Pt/caregiver will Perform Home Exercise Program: Right Upper extremity;Left upper extremity;With written HEP provided  Plan Discharge plan remains appropriate;Frequency needs to be updated    Co-evaluation                 AM-PAC OT "6 Clicks" Daily Activity     Outcome Measure   Help from another person eating meals?: None Help from another person taking care of personal grooming?: A Lot Help from another person toileting, which includes using toliet, bedpan, or urinal?: A Lot Help from another person bathing (including washing, rinsing, drying)?: A Lot Help from another person to put on and taking off regular upper body clothing?: A Little Help from another person to put on and taking off regular lower body clothing?: A Little 6 Click Score: 16    End of Session    OT Visit Diagnosis: Unsteadiness on feet (R26.81);History of falling (Z91.81);Muscle weakness (generalized) (M62.81);Other symptoms and signs involving the nervous system (R29.898)   Activity Tolerance Patient tolerated treatment well   Patient Left in bed;with call bell/phone within reach;with family/visitor present   Nurse Communication Mobility status        Time: 9678-9381 OT Time Calculation (min): 25 min  Charges: OT General Charges $OT Visit: 1 Visit OT Treatments $Self Care/Home Management : 8-22 mins $Therapeutic Exercise: 8-22 mins  Jared Cline, M.S. OTR/L  08/27/22, 3:22 PM  ascom [phone]

## 2022-08-27 NOTE — Evaluation (Signed)
Physical Therapy Treatment Patient Details Name: Jared Cline MRN: [id number] DOB: Aug 20, 1945 Today's Date: 08/27/2022  History of Present Illness  Pt is a 77 year old male admitted with Punctate acute infarct in the posteroinferior pons/right cerebellar peduncle; PMH significant for CAD, HTN, HLD CKD 3A, OSA on CPAP, history of prostate cancer s/p prostatectomy, complex renal cysts followed annually by urology.  Clinical Impression  Pt resting in bed upon PT arrival; pt agreeable to therapy.  During session pt CGA semi-supine to sitting edge of bed; min to mod assist with transfers using RW; and min assist to ambulate 70 feet x2 with RW use.  No lean noted in sitting but mild R lean and increased R lateral sway noted during ambulation requiring min assist for balance.  Pt noted with antalgic gait d/t R knee pain.  Pt's R knee and R lower leg appearing swollen (did not appear warm or red)--pt initially reporting being typical for him but pt's wife reporting this swelling was new--nurse notified and was going to come assess.  Pt reporting mild dizziness in sitting but increased with movement/standing activity although pt reporting symptoms improved when he kept his vision focused on object during movement.  BP 158/80 post activity.  Will continue to focus on strengthening, balance, and progressive functional mobility during hospitalization.     Recommendations for follow up therapy are one component of a multi-disciplinary discharge planning process, led by the attending physician.  Recommendations may be updated based on patient status, additional functional criteria and insurance authorization.  Follow Up Recommendations Acute inpatient rehab (3hours/day)      Assistance Recommended at Discharge Frequent or constant Supervision/Assistance  Patient can return home with the following  A lot of help with walking and/or transfers;A lot of help with bathing/dressing/bathroom;Assistance with  cooking/housework;Assist for transportation;Help with stairs or ramp for entrance    Equipment Recommendations Rolling walker (2 wheels);BSC/3in1  Recommendations for Other Services       Functional Status Assessment Patient has had a recent decline in their functional status and demonstrates the ability to make significant improvements in function in a reasonable and predictable amount of time.     Precautions / Restrictions Precautions Precautions: Fall Restrictions Weight Bearing Restrictions: No      Mobility  Bed Mobility Overal bed mobility: Needs Assistance Bed Mobility: Supine to Sit     Supine to sit: Min guard     General bed mobility comments: increased effort to perform on own    Transfers Overall transfer level: Needs assistance Equipment used: Rolling walker (2 wheels) Transfers: Sit to/from Stand Sit to Stand: Min assist, Mod assist           General transfer comment: min to mod assist to stand from bed; min assist to stand from recliner; vc's for UE/LE placement; pt unable to bend B knees to get feet underneath sufficiently initially so pt having to take a couple steps back while standing up to RW    Ambulation/Gait Ambulation/Gait assistance: Min assist Gait Distance (Feet):  (70 feet x2) Assistive device: Rolling walker (2 wheels)   Gait velocity: decreased     General Gait Details: antalgic; decreased stance time R LE; mild R lateral lean with increased R lateral sway; decreased B LE step length/foot clearance  Stairs            Wheelchair Mobility    Modified Rankin (Stroke Patients Only)       Balance Overall balance assessment: Needs assistance Sitting-balance support: No upper  extremity supported, Feet supported Sitting balance-Leahy Scale: Good Sitting balance - Comments: steady sitting reaching within BOS; mild R lean noted Postural control: Right lateral lean Standing balance support: Reliant on assistive device for  balance, Bilateral upper extremity supported, During functional activity Standing balance-Leahy Scale: Fair Standing balance comment: steady static standing with B UE support on RW                             Pertinent Vitals/Pain Pain Assessment Pain Assessment: Faces Faces Pain Scale: Hurts little more (4/10 at rest; 6-8/10 with activity) Pain Location: R knee Pain Descriptors / Indicators: Aching, Tender, Sore Pain Intervention(s): Limited activity within patient's tolerance, Monitored during session, Repositioned Vitals (HR and O2 on room air) stable and WFL throughout treatment session.     Home Living                          Prior Function                       Hand Dominance        Extremity/Trunk Assessment                Communication      Cognition Arousal/Alertness: Awake/alert Behavior During Therapy: WFL for tasks assessed/performed Overall Cognitive Status: Within Functional Limits for tasks assessed                                          General Comments  Nursing cleared pt for participation in physical therapy.  Pt agreeable to PT session.  Pt's wife present most of session.    Exercises     Assessment/Plan    PT Assessment Patient needs continued PT services  PT Problem List Decreased strength;Decreased activity tolerance;Decreased coordination;Decreased mobility;Decreased balance;Decreased knowledge of use of DME;Cardiopulmonary status limiting activity       PT Treatment Interventions DME instruction;Gait training;Stair training;Therapeutic exercise;Therapeutic activities;Functional mobility training;Balance training;Neuromuscular re-education;Manual techniques;Patient/family education    PT Goals (Current goals can be found in the Care Plan section)  Acute Rehab PT Goals Patient Stated Goal: to be independent again PT Goal Formulation: With patient Time For Goal Achievement:  09/08/22 Potential to Achieve Goals: Good    Frequency 7X/week     Co-evaluation               AM-PAC PT "6 Clicks" Mobility  Outcome Measure Help needed turning from your back to your side while in a flat bed without using bedrails?: A Little Help needed moving from lying on your back to sitting on the side of a flat bed without using bedrails?: A Little Help needed moving to and from a bed to a chair (including a wheelchair)?: A Little Help needed standing up from a chair using your arms (e.g., wheelchair or bedside chair)?: A Lot Help needed to walk in hospital room?: A Little Help needed climbing 3-5 steps with a railing? : Total 6 Click Score: 15    End of Session Equipment Utilized During Treatment: Gait belt Activity Tolerance: Patient tolerated treatment well Patient left: in chair;with call bell/phone within reach;with chair alarm set;with family/visitor present Nurse Communication: Mobility status;Precautions;Other (comment) (R knee and lower leg swelling noted) PT Visit Diagnosis: Unsteadiness on feet (R26.81);Other abnormalities of gait and mobility (R26.89);Muscle weakness (  generalized) (M62.81);Repeated falls (R29.6);Difficulty in walking, not elsewhere classified (R26.2);Dizziness and giddiness (R42)    Time: 0071-2197 PT Time Calculation (min) (ACUTE ONLY): 49 min   Charges:     PT Treatments $Gait Training: 23-37 mins $Therapeutic Activity: 8-22 mins       Leitha Bleak, PT 08/27/22, 12:30 PM

## 2022-08-27 NOTE — Progress Notes (Signed)
Inpatient Rehab Coordinator Note:  I spoke with patient over the phone to discuss CIR recommendations and goals/expectations of CIR stay.  We reviewed 3 hrs/day of therapy, physician follow up, and average length of stay 2 weeks (dependent upon progress) with goals of supervision to mod I.  He is very interested in CIR program and agreeable to goals/expectations.  He states his wife is home 24/7 and can provide supervision, along with a daughter who is available some if needed.  We discussed need for insurance approval and I will start that request today.  Will follow for determination.   Shann Medal, PT, DPT Admissions Coordinator [phone] 08/27/22  10:44 AM

## 2022-08-27 NOTE — Progress Notes (Incomplete)
PMR Admission Coordinator Pre-Admission Assessment   Patient: Jared Cline is an 77 y.o., male MRN: [id number] DOB: 1945/09/10 Height: 6' (182.9 cm) Weight: 102.8 kg   Insurance Information HMO: ***    PPO: ***     PCP: ***     IPA:      80/20:      OTHER:  PRIMARY: Blue Medicare      Policy#: [id number]       Subscriber: pt CM Name: ***      Phone#: ***     Fax#: *** Pre-Cert#: ***      Employer:  Benefits:  Phone #: ***     Name:  Eff. Date: ***     Deduct: ***      Out of Pocket Max: ***      Life Max: *** CIR: ***      SNF: *** Outpatient: ***     Co-Pay: *** Home Health: ***      Co-Pay: *** DME: ***     Co-Pay: *** Providers: *** SECONDARY:       Policy#:      Phone#:    Financial Counselor: ***      Phone#:    The "Data Collection Information Summary" for patients in Inpatient Rehabilitation Facilities with attached "Privacy Act [place] Records" was provided and verbally reviewed with: Patient   Emergency Contact Information Contact Information       Name Relation Home Work [place] A Spouse [phone]   [phone]    [place] Daughter [phone]               Current Medical History  Patient Admitting Diagnosis: CVA   History of Present Illness: Pt is a 77 y/o male with PMH of prostate can s/p prostatectomy, HTN, CAD, CKD III, OSA, and complex renal cysts who presents to [facility] on 08/24/22 with c/o fall at home.  Reports several day hx of dizziness/weakness prior to admit that resulted in multiple falls.  In ED BP 145/66, blood work unremarkable, MRI showed acute cerebellar stroke.  Neurology was consulted and recommended full workup.  MRA head/neck showed extensive intracranial atherosclerosis, advanced midbasilar and bilteral PCA stenosis, right M1 stenosis, and occluded left vertebral artery in the neck, and well as severe atheromatous stenosis at the proximal right ICA.  Pt found to have segmental PE and started on eliquis; benefit  of anticoagulation to outweigh risks.  No indication for antiplatelets in the setting of anticoagulation.  Recommend starting asa if he stops eliquis.  Therapy evaluations completed and pt was recommended for CIR.    Complete NIHSS TOTAL: 0   Patient's medical record from [facility] has been reviewed by the rehabilitation admission coordinator and physician.   Past Medical History      Past Medical History:  Diagnosis Date   BPH (benign prostatic hypertrophy)     Bradycardia      evaluated by Dr. Nehemiah Massed   Cancer [facility])      prostate   Carpal tunnel syndrome, left      left arm   CKD (chronic kidney disease) stage 2, GFR 60-89 ml/min     Diverticulitis     History of prostate cancer     HNP (herniated nucleus pulposus), lumbar     Hyperlipidemia     Hypertension     IBS (irritable bowel syndrome)     Impotence     Multiple lung nodules      nonspecific, largest 73m in  Jan 2016; scanned March 2017; rescan 3-6 months later   OA (osteoarthritis) of knee      right   Renal mass      evaluated; followed by urologist   Sleep apnea      on CPAP   Torn meniscus      x 2; right knee      Has the patient had major surgery during 100 days prior to admission? No   Family History   family history includes Alcohol abuse in his brother; Aneurysm in his father; Heart disease in his paternal uncle; Hypertension in his father and mother; Stroke in his mother.   Current Medications   Current Facility-Administered Medications:    acetaminophen (TYLENOL) tablet 650 mg, 650 mg, Oral, Q4H PRN, 650 mg at 08/27/22 1019 **OR** acetaminophen (TYLENOL) 160 MG/5ML solution 650 mg, 650 mg, Per Tube, Q4H PRN **OR** acetaminophen (TYLENOL) suppository 650 mg, 650 mg, Rectal, Q4H PRN, Athena Masse, MD   apixaban (ELIQUIS) tablet 10 mg, 10 mg, Oral, BID, 10 mg at 08/27/22 1004 **FOLLOWED BY** [START ON 09/01/2022] apixaban (ELIQUIS) tablet 5 mg, 5 mg, Oral, BID, Amin, Ankit Chirag, MD   cloNIDine (CATAPRES)  tablet 0.3 mg, 0.3 mg, Oral, BID, Amin, Ankit Chirag, MD, 0.3 mg at 08/27/22 1004   guaiFENesin (ROBITUSSIN) 100 MG/5ML liquid 5 mL, 5 mL, Oral, Q4H PRN, Amin, Ankit Chirag, MD   hydrALAZINE (APRESOLINE) tablet 10 mg, 10 mg, Oral, Q8H, Amin, Ankit Chirag, MD, 10 mg at 08/27/22 0525   ipratropium-albuterol (DUONEB) 0.5-2.5 (3) MG/3ML nebulizer solution 3 mL, 3 mL, Nebulization, Q4H PRN, Amin, Ankit Chirag, MD   irbesartan (AVAPRO) tablet 150 mg, 150 mg, Oral, Daily, Amin, Ankit Chirag, MD, 150 mg at 08/27/22 1004   metoprolol tartrate (LOPRESSOR) injection 5 mg, 5 mg, Intravenous, Q4H PRN, Amin, Ankit Chirag, MD   ondansetron (ZOFRAN) tablet 4 mg, 4 mg, Oral, Q6H PRN **OR** ondansetron (ZOFRAN) injection 4 mg, 4 mg, Intravenous, Q6H PRN, Judd Gaudier V, MD   oxyCODONE (Oxy IR/ROXICODONE) immediate release tablet 5 mg, 5 mg, Oral, Q4H PRN, Amin, Ankit Chirag, MD   pravastatin (PRAVACHOL) tablet 80 mg, 80 mg, Oral, QHS, Judd Gaudier V, MD, 80 mg at 08/26/22 2118   senna-docusate (Senokot-S) tablet 1 tablet, 1 tablet, Oral, QHS PRN, Amin, Ankit Chirag, MD   spironolactone (ALDACTONE) tablet 25 mg, 25 mg, Oral, Daily, Amin, Ankit Chirag, MD, 25 mg at 08/27/22 1004   traZODone (DESYREL) tablet 50 mg, 50 mg, Oral, QHS PRN, Amin, Ankit Chirag, MD   Patients Current Diet:  Diet Order                  Diet 2 gram sodium Room service appropriate? Yes; Fluid consistency: Thin  Diet effective now                         Precautions / Restrictions Precautions Precautions: Fall Restrictions Weight Bearing Restrictions: No    Has the patient had 2 or more falls or a fall with injury in the past year? Yes   Prior Activity Level Limited Community (1-2x/wk): fully independent, no DME used at home, cane in the community, driving   Prior Functional Level Self Care: Did the patient need help bathing, dressing, using the toilet or eating? Independent   Indoor Mobility: Did the patient need  assistance with walking from room to room (with or without device)? Independent   Stairs: Did the patient need assistance with  internal or external stairs (with or without device)? Independent   Functional Cognition: Did the patient need help planning regular tasks such as shopping or remembering to take medications? Independent   Patient Information Are you of Hispanic, Latino/a,or Spanish origin?: A. No, not of Hispanic, Latino/a, or Spanish origin What is your race?: B. Black or African American Do you need or want an interpreter to communicate with a doctor or health care staff?: 0. No   Patient's Response To:  Health Literacy and Transportation Is the patient able to respond to health literacy and transportation needs?: Yes Health Literacy - How often do you need to have someone help you when you read instructions, pamphlets, or other written material from your doctor or pharmacy?: Never In the past 12 months, has lack of transportation kept you from medical appointments or from getting medications?: No In the past 12 months, has lack of transportation kept you from meetings, work, or from getting things needed for daily living?: No   Home Assistive Devices / [place] Devices/Equipment: Environmental consultant (specify type) Home Equipment: Cane - quad, Cane - single point   Prior Device Use: Indicate devices/aids used by the patient prior to current illness, exacerbation or injury?  Cane in the community   Current Functional Level Cognition   Overall Cognitive Status: Impaired/Different from baseline Orientation Level: Oriented X4 General Comments: appears WFL, will continue to assess    Extremity Assessment (includes Sensation/Coordination)   Upper Extremity Assessment: Generalized weakness, RUE deficits/detail, LUE deficits/detail RUE Deficits / Details: mildly impaired FMC/dexterity B hands; strength grossly 4/5 throughout RUE Sensation: WNL RUE Coordination: decreased fine  motor LUE Deficits / Details: mildly impaired FMC/dexterity B hands; strength grossly 4/5 throughout LUE Sensation: WNL LUE Coordination: decreased fine motor  Lower Extremity Assessment: Defer to PT evaluation     ADLs   Overall ADL's : Needs assistance/impaired Upper Body Dressing : Minimal assistance, Sitting Lower Body Dressing: Moderate assistance, Sit to/from stand Lower Body Dressing Details (indicate cue type and reason): pants Toileting- Clothing Manipulation and Hygiene: Moderate assistance, Sit to/from stand     Mobility   Overal bed mobility: Needs Assistance Bed Mobility: Supine to Sit, Sit to Supine Supine to sit: Min assist Sit to supine: Supervision General bed mobility comments: vc's for technique     Transfers   Overall transfer level: Needs assistance Equipment used: Rolling walker (2 wheels) Transfers: Sit to/from Stand Sit to Stand: Min assist, Mod assist General transfer comment: x3 trials standing from bed up to RW; vc's for scooting forward prior to standing and for UE/LE placement; assist to initiate stand and control descent sitting     Ambulation / Gait / Stairs / Wheelchair Mobility   Ambulation/Gait Ambulation/Gait assistance: Herbalist (Feet):  (pt took steps in place 2x5 reps B LE's (sitting rest break between) and sidestepping to R along bed a few feet with RW use) Assistive device: Rolling walker (2 wheels) General Gait Details: antalgic; decreased stance time R LE (pt reports h/o R>L knee pain issues); increased effort to take steps Gait velocity: decreased     Posture / Balance Dynamic Sitting Balance Sitting balance - Comments: steady sitting reaching within BOS; mild R lean noted Balance Overall balance assessment: Needs assistance Sitting-balance support: No upper extremity supported, Feet supported Sitting balance-Leahy Scale: Good Sitting balance - Comments: steady sitting reaching within BOS; mild R lean noted Postural  control: Right lateral lean Standing balance support: Reliant on assistive device for balance, Bilateral  upper extremity supported, During functional activity Standing balance-Leahy Scale: Poor Standing balance comment: assist required for balance in standing; mild R lateral lean noted     Special needs/care consideration N/a    Previous Home Environment (from acute therapy documentation) Living Arrangements: Spouse/significant other Available Help at Discharge: Family, Available 24 hours/day Type of Home: House Home Layout: One level Home Access: Stairs to enter Entrance Stairs-Rails: Left, Right Entrance Stairs-Number of Steps: 6 Bathroom Shower/Tub: Chiropodist: Handicapped height Bathroom Accessibility: No Home Care Services: No   Discharge Living Setting Plans for Discharge Living Setting: Patient's home, Lives with (comment) (spouse) Type of Home at Discharge: House Discharge Home Layout: One level Discharge Home Access: Stairs to enter Entrance Stairs-Rails: Right, Left Entrance Stairs-Number of Steps: 6 Discharge Bathroom Shower/Tub: Tub/shower unit Discharge Bathroom Toilet: Handicapped height Discharge Bathroom Accessibility: No Does the patient have any problems obtaining your medications?: No   Social/Family/Support Systems Patient Roles: Spouse Anticipated Caregiver: Oisin [place]akum Anticipated Caregiver's Contact Information: [phone] Ability/Limitations of Caregiver: supervision only Caregiver Availability: 24/7 Discharge Plan Discussed with Primary Caregiver: Yes Is Caregiver In Agreement with Plan?: Yes Does Caregiver/Family have Issues with Lodging/Transportation while Pt is in Rehab?: No   Goals Patient/Family Goal for Rehab: PT/OT/SLP supervision to mod I Expected length of stay: 10-12 days Pt/Family Agrees to Admission and willing to participate: Yes Program Orientation Provided & Reviewed with Pt/Caregiver Including Roles  &  Responsibilities: Yes  Barriers to Discharge: Insurance for SNF coverage   Decrease burden of Care through IP rehab admission: na   Possible need for SNF placement upon discharge: not anticipated   Patient Condition: I have reviewed medical records from [facility], spoken with CM, and patient. I discussed via phone for inpatient rehabilitation assessment.  Patient will benefit from ongoing PT, OT, and SLP, can actively participate in 3 hours of therapy a day 5 days of the week, and can make measurable gains during the admission.  Patient will also benefit from the coordinated team approach during an Inpatient Acute Rehabilitation admission.  The patient will receive intensive therapy as well as Rehabilitation physician, nursing, social worker, and care management interventions.  Due to safety, disease management, medication administration, pain management, and patient education the patient requires 24 hour a day rehabilitation nursing.  The patient is currently min assist with mobility and basic ADLs.  Discharge setting and therapy post discharge at home with home health is anticipated.  Patient has agreed to participate in the Acute Inpatient Rehabilitation Program and will admit pending insurance approval ***.   Preadmission Screen Completed By:  Michel Santee, 08/27/2022 10:49 AM

## 2022-08-28 ENCOUNTER — Encounter: Payer: Self-pay | Admitting: Internal Medicine

## 2022-08-28 DIAGNOSIS — I639 Cerebral infarction, unspecified: Secondary | ICD-10-CM | POA: Diagnosis not present

## 2022-08-28 LAB — BASIC METABOLIC PANEL
Anion gap: 7 (ref 5–15)
BUN: 32 mg/dL — ABNORMAL HIGH (ref 8–23)
CO2: 22 mmol/L (ref 22–32)
Calcium: 8.8 mg/dL — ABNORMAL LOW (ref 8.9–10.3)
Chloride: 104 mmol/L (ref 98–111)
Creatinine, Ser: 1.4 mg/dL — ABNORMAL HIGH (ref 0.61–1.24)
GFR, Estimated: 52 mL/min — ABNORMAL LOW (ref >60)
Glucose, Bld: 98 mg/dL (ref 70–99)
Potassium: 4.4 mmol/L (ref 3.5–5.1)
Sodium: 133 mmol/L — ABNORMAL LOW (ref 135–145)

## 2022-08-28 LAB — MAGNESIUM: Magnesium: 2.2 mg/dL (ref 1.7–2.4)

## 2022-08-28 MED ORDER — POLYETHYLENE GLYCOL 3350 17 G PO PACK
17.0000 g | PACK | Freq: Every day | ORAL | Status: DC
  Administered 2022-08-28 – 2022-08-29 (×2): 17 g via ORAL
  Filled 2022-08-28 (×2): qty 1

## 2022-08-28 MED ORDER — SENNOSIDES-DOCUSATE SODIUM 8.6-50 MG PO TABS
1.0000 | ORAL_TABLET | Freq: Two times a day (BID) | ORAL | Status: DC
  Administered 2022-08-28 – 2022-08-29 (×3): 1 via ORAL
  Filled 2022-08-28 (×3): qty 1

## 2022-08-28 MED ORDER — BISACODYL 5 MG PO TBEC: 5.0000 mg | DELAYED_RELEASE_TABLET | Freq: Every day | ORAL | Status: DC | PRN

## 2022-08-28 NOTE — Progress Notes (Signed)
Physical Therapy Treatment Patient Details Name: Jared Cline MRN: [id number] DOB: 1946-03-05 Today's Date: 08/28/2022   History of Present Illness Pt is a 77 year old male admitted with Punctate acute infarct in the posteroinferior pons/right cerebellar peduncle; PMH significant for CAD, HTN, HLD CKD 3A, OSA on CPAP, history of prostate cancer s/p prostatectomy, complex renal cysts followed annually by urology.    PT Comments    Pt was long sitting in bed with supportive spouse at bedside. He remains A and O and extremely motivated. He is limited by fatigue but overall puts forth great effort during session. Overall he continues to present with severe balance deficits. High fall risk without BUE support in standing. Attempted standing without BUE support (+1HHA) however severe posterior LOB noted. Pt was able to ambulate short distances with RW but is limited by fatigue and R knee pain. X rays negative on RLE but pt continues to have inflammation and pain not usually present in that knee. Will make MD aware. Pt remains far from baseline and a terrific CIR candidate at DC. He will benefit form the aggressive PT that a SNF can not provide to maximize pt's safety with all ADLs. Pt will need extensive PT to return to PLOF.    Recommendations for follow up therapy are one component of a multi-disciplinary discharge planning process, led by the attending physician.  Recommendations may be updated based on patient status, additional functional criteria and insurance authorization.   Follow Up Recommendations  Acute inpatient rehab (3hours/day)        Patient can return home with the following A lot of help with walking and/or transfers;A lot of help with bathing/dressing/bathroom;Assistance with cooking/housework;Assist for transportation;Help with stairs or ramp for entrance   Equipment Recommendations  Rolling walker (2 wheels);BSC/3in1       Precautions / Restrictions Precautions Precautions:  Fall Restrictions Weight Bearing Restrictions: No     Mobility  Bed Mobility Overal bed mobility: Needs Assistance Bed Mobility: Supine to Sit  Supine to sit: Supervision  General bed mobility comments: increased time required to perform with use of bed fuctions to assist him.    Transfers Overall transfer level: Needs assistance Equipment used: Rolling walker (2 wheels) Transfers: Sit to/from Stand Sit to Stand: Min assist    General transfer comment: pt performed STS 4 x throughout session. min assist form standard height surfaces    Ambulation/Gait Ambulation/Gait assistance: Min assist, Min guard, Max assist Gait Distance (Feet): 50 Feet Assistive device: Rolling walker (2 wheels) Gait Pattern/deviations: Step-through pattern, Narrow base of support, Scissoring Gait velocity: decreased     General Gait Details: Pt was able to ambulate ~ 50 ft with RW prior to requiring seated rest. perform 2 x 50 ft with RW however without AD, Pt has severe balance concerns. HHA +1 attempted however mod assist required to prevent posterior LOB. pt is reliant on RW in standing to maintain standing balance. pt puts forth great effort throughout   Balance Overall balance assessment: Needs assistance Sitting-balance support: No upper extremity supported, Feet supported Sitting balance-Leahy Scale: Good     Standing balance support: Bilateral upper extremity supported, During functional activity, Reliant on assistive device for balance Standing balance-Leahy Scale: Poor Standing balance comment: pt has severe posterior LOB in standing without BUE support       Cognition Arousal/Alertness: Awake/alert Behavior During Therapy: WFL for tasks assessed/performed Overall Cognitive Status: Within Functional Limits for tasks assessed      General Comments: Pt is alert,  pleasant, and oriented. Very motivated wuith supportive family present throughout.           General Comments General  comments (skin integrity, edema, etc.): performed balance activities to promote fwd wt shift. attempted covering R eye to aid with dizziness however did not help.      Pertinent Vitals/Pain Pain Assessment Pain Assessment: 0-10 Pain Score: 6  Pain Location: R knee Pain Descriptors / Indicators: Aching, Tender, Sore Pain Intervention(s): Limited activity within patient's tolerance, Monitored during session, Repositioned     PT Goals (current goals can now be found in the care plan section) Acute Rehab PT Goals Patient Stated Goal: return to PLOF Progress towards PT goals: Progressing toward goals    Frequency    7X/week      PT Plan Current plan remains appropriate    Co-evaluation     PT goals addressed during session: Mobility/safety with mobility;Balance;Proper use of DME;Strengthening/ROM        AM-PAC PT "6 Clicks" Mobility   Outcome Measure  Help needed turning from your back to your side while in a flat bed without using bedrails?: A Little Help needed moving from lying on your back to sitting on the side of a flat bed without using bedrails?: A Little Help needed moving to and from a bed to a chair (including a wheelchair)?: A Little Help needed standing up from a chair using your arms (e.g., wheelchair or bedside chair)?: A Lot Help needed to walk in hospital room?: A Lot Help needed climbing 3-5 steps with a railing? : A Lot 6 Click Score: 15    End of Session Equipment Utilized During Treatment: Gait belt Activity Tolerance: Patient tolerated treatment well;Patient limited by fatigue Patient left: in chair;with call bell/phone within reach;with chair alarm set;with family/visitor present Nurse Communication: Mobility status PT Visit Diagnosis: Unsteadiness on feet (R26.81);Other abnormalities of gait and mobility (R26.89);Muscle weakness (generalized) (M62.81);Repeated falls (R29.6);Difficulty in walking, not elsewhere classified (R26.2);Dizziness and  giddiness (R42)     Time: 7902-4097 PT Time Calculation (min) (ACUTE ONLY): 23 min  Charges:  $Gait Training: 8-22 mins $Neuromuscular Re-education: 8-22 mins                    Julaine Fusi PTA 08/28/22, 3:16 PM

## 2022-08-28 NOTE — Care Management Important Message (Signed)
Important Message  Patient Details  Name: Jared Cline MRN: [id number] Date of Birth: 09/28/1945   Medicare Important Message Given:  N/A - LOS <3 / Initial given by admissions     Jared Cline 08/28/2022, 7:38 AM

## 2022-08-28 NOTE — Plan of Care (Signed)
  Problem: Education: Goal: Knowledge of disease or condition will improve Outcome: Progressing Goal: Knowledge of secondary prevention will improve (MUST DOCUMENT ALL) Outcome: Progressing Goal: Knowledge of patient specific risk factors will improve [place] N/A or DELETE if not current risk factor) Outcome: Progressing   Problem: Ischemic Stroke/TIA Tissue Perfusion: Goal: Complications of ischemic stroke/TIA will be minimized Outcome: Progressing   Problem: Coping: Goal: Will verbalize positive feelings about self Outcome: Progressing Goal: Will identify appropriate support needs Outcome: Progressing   Problem: Health Behavior/Discharge Planning: Goal: Ability to manage health-related needs will improve Outcome: Progressing Goal: Goals will be collaboratively established with patient/family Outcome: Progressing   Problem: Self-Care: Goal: Ability to participate in self-care as condition permits will improve Outcome: Progressing Goal: Verbalization of feelings and concerns over difficulty with self-care will improve Outcome: Progressing Goal: Ability to communicate needs accurately will improve Outcome: Progressing   Problem: Nutrition: Goal: Risk of aspiration will decrease Outcome: Progressing Goal: Dietary intake will improve Outcome: Progressing   Problem: Education: Goal: Knowledge of General Education information will improve Description: Including pain rating scale, medication(s)/side effects and non-pharmacologic comfort measures Outcome: Progressing   Problem: Health Behavior/Discharge Planning: Goal: Ability to manage health-related needs will improve Outcome: Progressing   Problem: Clinical Measurements: Goal: Ability to maintain clinical measurements within normal limits will improve Outcome: Progressing Goal: Will remain free from infection Outcome: Progressing Goal: Diagnostic test results will improve Outcome: Progressing Goal: Respiratory  complications will improve Outcome: Progressing Goal: Cardiovascular complication will be avoided Outcome: Progressing   Problem: Activity: Goal: Risk for activity intolerance will decrease Outcome: Progressing   Problem: Nutrition: Goal: Adequate nutrition will be maintained Outcome: Progressing   Problem: Coping: Goal: Level of anxiety will decrease Outcome: Progressing   Problem: Elimination: Goal: Will not experience complications related to bowel motility Outcome: Progressing Goal: Will not experience complications related to urinary retention Outcome: Progressing   Problem: Pain Managment: Goal: General experience of comfort will improve Outcome: Progressing   Problem: Safety: Goal: Ability to remain free from injury will improve Outcome: Progressing   Problem: Skin Integrity: Goal: Risk for impaired skin integrity will decrease Outcome: Progressing   Problem: Education: Goal: Knowledge of disease or condition will improve Outcome: Progressing Goal: Knowledge of secondary prevention will improve (MUST DOCUMENT ALL) Outcome: Progressing Goal: Knowledge of patient specific risk factors will improve [place] N/A or DELETE if not current risk factor) Outcome: Progressing   Problem: Ischemic Stroke/TIA Tissue Perfusion: Goal: Complications of ischemic stroke/TIA will be minimized Outcome: Progressing   Problem: Coping: Goal: Will verbalize positive feelings about self Outcome: Progressing Goal: Will identify appropriate support needs Outcome: Progressing   Problem: Health Behavior/Discharge Planning: Goal: Ability to manage health-related needs will improve Outcome: Progressing Goal: Goals will be collaboratively established with patient/family Outcome: Progressing   Problem: Self-Care: Goal: Ability to participate in self-care as condition permits will improve Outcome: Progressing Goal: Verbalization of feelings and concerns over difficulty with self-care  will improve Outcome: Progressing Goal: Ability to communicate needs accurately will improve Outcome: Progressing   Problem: Nutrition: Goal: Risk of aspiration will decrease Outcome: Progressing Goal: Dietary intake will improve Outcome: Progressing

## 2022-08-28 NOTE — Progress Notes (Signed)
PROGRESS NOTE   Jared Cline  UDJ:[id number]    DOB: 05-07-1946    DOA: 08/24/2022  PCP: Bo Merino, FNP   I have briefly reviewed patients previous medical [facility] in [facility].  Chief Complaint  Patient presents with   Dizziness   Fall   Weakness    Brief Narrative:  77 y.o. male with medical history significant for CAD, HTN, HLD CKD 3A, OSA on CPAP, history of prostate cancer s/p prostatectomy, complex renal cysts followed annually by urology, who was brought in by EMS for a fall with inability to get up.  Over the past several days patient has had dizziness and weakness resulting in multiple falls.  MRI brain was consistent with acute punctate infarct in the cerebellar region.  CT of the chest abdomen pelvis did not show any acute pathology but there was concerns for segmental pulmonary embolism.  CTA confirmed segmental pulmonary embolism therefore started on anticoagulation.  PT/OT recommended CIR. currently medically optimized for DC to CIR pending insurance authorization and bed.   Assessment & Plan:  Principal Problem:   Acute cerebrovascular accident of cerebellum ([place]) Active Problems:   Frequent falls   Abdominal pain   Abnormal CT of the chest   Hypertension   Stage 3a chronic kidney disease (HCC)   Renal cyst   OSA on CPAP   PVD (peripheral vascular disease) (HCC)   Coronary artery disease involving native coronary artery of native heart   History of radical prostatectomy   Cerebellar stroke, acute (HCC)   CVA (cerebral vascular accident) ([place])   Acute cerebrovascular accident of cerebellum ([place]) Frequent falls MRI showing Punctate acute infarct in the posteroinferior pons/right cerebellar peduncle.   LDL 86, A1c 5.8.  Prior TRH MD discussed case with neurology, at this time patient was started on Eliquis given pulmonary embolism and hold off on antiplatelet.  In the future once he has completed anticoagulation treatment, antiplatelet can be started. MRA  head and neck showed extensive intracranial atherosclerosis and occluded left vertebral artery.  Neurology recommended continuing anticoagulation for PE. But eventually will need to be on antiplatelets if therapeutic anticoagulation is discontinued.  Outpatient follow-up with neurology Echocardiogram-70% EF, grade 1 DD PT/OT-CIR.  Currently medically optimized for DC pending CIR bed and insurance authorization.   Right knee pain and swelling Denies history of gout.  Patient and spouse at bedside confirm injury to right knee during one of his 4 falls PTA.  X-ray of right knee shows effusion but no other acute bony findings.  Does show chronic degenerative findings.  Both verbalized history of osteoarthritis.  Supportive treatment with analgesics and physical therapy.  If symptoms and sign do not improve or worsen then may need further evaluation including CT of the right knee and orthopedic evaluation.  This was discussed with patient and spouse and they verbalized understanding.   Segmental pulmonary embolism, acute Eliquis twice daily.  Duration of anticoagulation?  6 months versus prolonged if this is presumed as without provocation.  This needs to be followed as outpatient with PCP and final determination has to be made.  May consider outpatient hematology consultation.   History of radical prostatectomy No acute issues   Coronary artery disease involving native coronary artery of native heart Continue  pravastatin.  No angina.   PVD (peripheral vascular disease) (HCC) Continue  pravastatin   OSA on CPAP CPAP nightly   Renal cyst Followed by urology   Stage 3a chronic kidney disease ([place]) Creatinine went up  slightly from 1.2 to 1.42 days ago but has plateaued in the 1.4 range despite IV fluids.  Recommend following BMP as outpatient.  The bump in creatinine may be related to contrast for CTA chest.   Hypertension Controlled.  Body mass index is 30.74 kg/m./Obesity    DVT  prophylaxis:   Currently on Eliquis   Code Status: Full Code:  Family Communication: Spouse at bedside Disposition:  Status is: Inpatient Remains inpatient appropriate because: Medically optimized for DC to CIR pending bed and insurance authorization.     Consultants:   Neurology  Procedures:     Antimicrobials:      Subjective:  Right knee pain, rates at 9/10 in severity.  Patient and spouse at bedside confirm right knee trauma during one of the falls.  No other complaints reported.  Per nursing, chronic constipation.  Objective:   Vitals:   08/27/22 2012 08/28/22 0006 08/28/22 0539 08/28/22 0738  BP: 127/77 (!) 119/49 (!) 112/53 (!) 138/49  Pulse: 71 (!) 59 66 68  Resp: '16 16 16 16  '$ Temp: 98.4 F (36.9 C) 98.6 F (37 C) 98 F (36.7 C) 98.5 F (36.9 C)  TempSrc:  Oral Oral Oral  SpO2: 98% 98% 99% 99%  Weight:      Height:        General exam: Elderly male, moderately built and obese lying comfortably propped up in bed without distress. Respiratory system: Clear to auscultation. Respiratory effort normal. Cardiovascular system: S1 & S2 heard, RRR. No JVD, murmurs, rubs, gallops or clicks. No pedal edema.  Telemetry personally reviewed: Sinus rhythm. Gastrointestinal system: Abdomen is nondistended, soft and nontender. No organomegaly or masses felt. Normal bowel sounds heard. Central nervous system: Alert and oriented. No focal neurological deficits. Extremities: Symmetric 5 x 5 power.  Right knee with moderate boggy swelling compared to left, minimally warm but no tenderness to touch, slightly painful range of movements, has a cystic swelling on the superior lateral aspect of the right knee, may be related to the joint effusion itself but needs to be monitored closely as outpatient.  This was discussed with patient and spouse. Skin: No rashes, lesions or ulcers Psychiatry: Judgement and insight appear normal. Mood & affect appropriate.     Data Reviewed:   I have  personally reviewed following labs and imaging studies   CBC: Recent Labs  Lab 08/23/22 2016 08/24/22 0557 08/25/22 0530 08/26/22 0553 08/27/22 0512  WBC 10.0   < > 7.1 6.7 6.6  NEUTROABS 6.5  --   --   --   --   HGB 13.7   < > 13.7 12.8* 12.6*  HCT 42.7   < > 41.3 39.4 39.0  MCV 86.6   < > 84.6 85.1 85.0  PLT 342   < > 342 319 311   < > = values in this interval not displayed.    Basic Metabolic Panel: Recent Labs  Lab 08/23/22 2016 08/24/22 0557 08/25/22 0530 08/26/22 0553 08/27/22 0512 08/28/22 0002  NA 134*  --  134* 133* 131* 133*  K 4.3  --  4.0 4.3 4.0 4.4  CL 98  --  102 101 100 104  CO2 25  --  '22 22 23 22  '$ GLUCOSE 97  --  95 106* 106* 98  BUN 28*  --  23 19 26* 32*  CREATININE 1.47* 1.53* 1.31* 1.20 1.40* 1.40*  CALCIUM 9.6  --  9.3 9.2 8.9 8.8*  MG  --   --  2.1 2.2 2.1 2.2    Liver Function Tests: Recent Labs  Lab 08/24/22 0347  AST 17  ALT 9  ALKPHOS 50  BILITOT 0.9  PROT 7.2  ALBUMIN 3.9    CBG: No results for input(s): "GLUCAP" in the last 168 hours.  Microbiology Studies:   Recent Results (from the past 240 hour(s))  Resp panel by RT-PCR (RSV, Flu A&B, Covid) Anterior Nasal Swab     Status: None   Collection Time: 08/24/22  3:47 AM   Specimen: Anterior Nasal Swab  Result Value Ref Range Status   SARS Coronavirus 2 by RT PCR NEGATIVE NEGATIVE Final    Comment: (NOTE) SARS-CoV-2 target nucleic acids are NOT DETECTED.  The SARS-CoV-2 RNA is generally detectable in upper respiratory specimens during the acute phase of infection. The lowest concentration of SARS-CoV-2 viral copies this assay can detect is 138 copies/mL. A negative result does not preclude SARS-Cov-2 infection and should not be used as the sole basis for treatment or other patient management decisions. A negative result may occur with  improper specimen collection/handling, submission of specimen other than nasopharyngeal swab, presence of viral mutation(s) within  the areas targeted by this assay, and inadequate number of viral copies(<138 copies/mL). A negative result must be combined with clinical observations, patient history, and epidemiological information. The expected result is Negative.  Fact Sheet for Patients:  EntrepreneurPulse.com.au  Fact Sheet for Healthcare Providers:  IncredibleEmployment.be  This test is no t yet approved or cleared by the [place] FDA and  has been authorized for detection and/or diagnosis of SARS-CoV-2 by FDA under an Emergency Use Authorization (EUA). This EUA will remain  in effect (meaning this test can be used) for the duration of the COVID-19 declaration under Section 564(b)(1) of the Act, 21 U.S.C.section 360bbb-3(b)(1), unless the authorization is terminated  or revoked sooner.       Influenza A by PCR NEGATIVE NEGATIVE Final   Influenza B by PCR NEGATIVE NEGATIVE Final    Comment: (NOTE) The Xpert Xpress SARS-CoV-2/FLU/RSV plus assay is intended as an aid in the diagnosis of influenza from Nasopharyngeal swab specimens and should not be used as a sole basis for treatment. Nasal washings and aspirates are unacceptable for Xpert Xpress SARS-CoV-2/FLU/RSV testing.  Fact Sheet for Patients: EntrepreneurPulse.com.au  Fact Sheet for Healthcare Providers: IncredibleEmployment.be  This test is not yet approved or cleared by the [place] FDA and has been authorized for detection and/or diagnosis of SARS-CoV-2 by FDA under an Emergency Use Authorization (EUA). This EUA will remain in effect (meaning this test can be used) for the duration of the COVID-19 declaration under Section 564(b)(1) of the Act, 21 U.S.C. section 360bbb-3(b)(1), unless the authorization is terminated or revoked.     Resp Syncytial Virus by PCR NEGATIVE NEGATIVE Final    Comment: (NOTE) Fact Sheet for  Patients: EntrepreneurPulse.com.au  Fact Sheet for Healthcare Providers: IncredibleEmployment.be  This test is not yet approved or cleared by the [place] FDA and has been authorized for detection and/or diagnosis of SARS-CoV-2 by FDA under an Emergency Use Authorization (EUA). This EUA will remain in effect (meaning this test can be used) for the duration of the COVID-19 declaration under Section 564(b)(1) of the Act, 21 U.S.C. section 360bbb-3(b)(1), unless the authorization is terminated or revoked.  Performed at [facility], [address] [place], [place] [address]     Radiology Studies:  DG Knee 1-2 Views Right  Result Date: 08/27/2022 CLINICAL DATA:  77 year old male  with knee pain and swelling. EXAM: RIGHT KNEE - 1-2 VIEW COMPARISON:  No prior knee series. FINDINGS: AP and cross-table lateral views. Bulky tricompartmental degeneration with severe lateral compartment joint space loss. Near bone on bone appearance there. Bulky degenerative spurring. Small to moderate suprapatellar joint effusion. No acute osseous abnormality identified. Calcified atherosclerosis posterior to the knee. IMPRESSION: Bulky tricompartmental degeneration, most severe in the lateral compartment. Small to moderate joint effusion. No acute osseous abnormality identified. Electronically Signed   By: Genevie Ann M.D.   On: 08/27/2022 12:25   ECHOCARDIOGRAM COMPLETE  Result Date: 08/26/2022    ECHOCARDIOGRAM REPORT   Patient Name:   JOSAIAH Polidori Date of Exam: 08/26/2022 Medical Rec #:  [id number]   Height:       72.0 in Accession #:    [id number]  Weight:       226.6 lb Date of Birth:  25-Sep-1945    BSA:          2.247 m Patient Age:    47 years    BP:           151/77 mmHg Patient Gender: M           HR:           78 bpm. Exam Location:  [facility] Procedure: 2D Echo, Cardiac Doppler and Color Doppler Indications:     Stroke I63.9  History:         Patient has no prior history  of Echocardiogram examinations.                  Risk Factors:Hypertension and Sleep Apnea.  Sonographer:     Sherrie Sport Referring Phys:  [id number] Athena Masse Diagnosing Phys: Yolonda Kida MD  Sonographer Comments: Suboptimal apical window. IMPRESSIONS  1. Borderline Focal Inferior Lateral hypo.  2. Left ventricular ejection fraction, by estimation, is 65 to 70%. The left ventricle has normal function. The left ventricle has no regional wall motion abnormalities. There is severe concentric left ventricular hypertrophy. Left ventricular diastolic  parameters are consistent with Grade I diastolic dysfunction (impaired relaxation).  3. Right ventricular systolic function is normal. The right ventricular size is normal.  4. The mitral valve is normal in structure. Trivial mitral valve regurgitation.  5. The aortic valve is normal in structure. Aortic valve regurgitation is not visualized. FINDINGS  Left Ventricle: Left ventricular ejection fraction, by estimation, is 65 to 70%. The left ventricle has normal function. The left ventricle has no regional wall motion abnormalities. The left ventricular internal cavity size was normal in size. There is  severe concentric left ventricular hypertrophy. Left ventricular diastolic parameters are consistent with Grade I diastolic dysfunction (impaired relaxation). Right Ventricle: The right ventricular size is normal. No increase in right ventricular wall thickness. Right ventricular systolic function is normal. Left Atrium: Left atrial size was normal in size. Right Atrium: Right atrial size was normal in size. Pericardium: There is no evidence of pericardial effusion. Mitral Valve: The mitral valve is normal in structure. Trivial mitral valve regurgitation. Tricuspid Valve: The tricuspid valve is normal in structure. Tricuspid valve regurgitation is trivial. Aortic Valve: The aortic valve is normal in structure. Aortic valve regurgitation is not visualized. Aortic valve  mean gradient measures 4.0 mmHg. Aortic valve peak gradient measures 7.5 mmHg. Aortic valve area, by VTI measures 2.75 cm. Pulmonic Valve: The pulmonic valve was normal in structure. Pulmonic valve regurgitation is not visualized. Aorta: The ascending aorta was not well visualized. IAS/Shunts:  No atrial level shunt detected by color flow Doppler. Additional Comments: Borderline Focal Inferior Lateral hypo.  LEFT VENTRICLE PLAX 2D LVIDd:         4.20 cm   Diastology LVIDs:         2.70 cm   LV e' medial:    4.24 cm/s LV PW:         1.80 cm   LV E/e' medial:  14.5 LV IVS:        1.90 cm   LV e' lateral:   9.36 cm/s LVOT diam:     2.10 cm   LV E/e' lateral: 6.5 LV SV:         57 LV SV Index:   26 LVOT Area:     3.46 cm  RIGHT VENTRICLE RV S prime:     13.70 cm/s TAPSE (M-mode): 2.1 cm LEFT ATRIUM             Index        RIGHT ATRIUM           Index LA diam:        3.20 cm 1.42 cm/m   RA Area:     16.90 cm LA Vol (A2C):   68.6 ml 30.53 ml/m  RA Volume:   48.40 ml  21.54 ml/m LA Vol (A4C):   25.0 ml 11.13 ml/m LA Biplane Vol: 41.3 ml 18.38 ml/m  AORTIC VALVE AV Area (Vmax):    2.55 cm AV Area (Vmean):   2.55 cm AV Area (VTI):     2.75 cm AV Vmax:           137.00 cm/s AV Vmean:          89.400 cm/s AV VTI:            0.209 m AV Peak Grad:      7.5 mmHg AV Mean Grad:      4.0 mmHg LVOT Vmax:         101.00 cm/s LVOT Vmean:        65.800 cm/s LVOT VTI:          0.166 m LVOT/AV VTI ratio: 0.79  AORTA Ao Root diam: 3.80 cm MITRAL VALVE               TRICUSPID VALVE MV Area (PHT): 2.28 cm    TR Peak grad:   14.3 mmHg MV Decel Time: 333 msec    TR Vmax:        189.00 cm/s MV E velocity: 61.30 cm/s MV A velocity: 91.70 cm/s  SHUNTS MV E/A ratio:  0.67        Systemic VTI:  0.17 m                            Systemic Diam: 2.10 cm Dwayne D Callwood MD Electronically signed by Yolonda Kida MD Signature Date/Time: 08/26/2022/10:18:58 PM    Final     Scheduled Meds:    apixaban  10 mg Oral BID   Followed by    Derrill Memo ON 09/01/2022] apixaban  5 mg Oral BID   cloNIDine  0.3 mg Oral BID   hydrALAZINE  10 mg Oral Q8H   irbesartan  150 mg Oral Daily   polyethylene glycol  17 g Oral Daily   pravastatin  80 mg Oral QHS   senna-docusate  1 tablet Oral BID   spironolactone  25 mg Oral Daily  Continuous Infusions:     LOS: 3 days     Vernell Leep, MD,  FACP, [facility], [facility], [facility], [place]     To contact the attending provider between 7A-7P or the covering provider during after hours 7P-7A, please log into the web site [email or web address] and access using universal [place] password for that web site. If you do not have the password, please call the hospital operator.  08/28/2022, 9:53 AM

## 2022-08-28 NOTE — Progress Notes (Signed)
Occupational Therapy Treatment Patient Details Name: Jared Cline MRN: [id number] DOB: [birth year] Today's Date: 08/28/2022   History of present illness Pt is a 77 year old male admitted with Punctate acute infarct in the posteroinferior pons/right cerebellar peduncle; PMH significant for CAD, HTN, HLD CKD 3A, OSA on CPAP, history of prostate cancer s/p prostatectomy, complex renal cysts followed annually by urology.   OT comments  [name] was seen for OT treatment on this date. Upon arrival to room pt completing PT, agreeable to tx. Pt requires MIN A + RW for SPT. Continues to endorse R knee pain. Completes visual scanning task identifying double digits with 80% accuracy. MOD cues and increased time to complete simple maze. Continues to be limited by dizziness/balance deficits. Pt making good progress toward goals, will continue to follow POC. Discharge recommendation remains appropriate.     Recommendations for follow up therapy are one component of a multi-disciplinary discharge planning process, led by the attending physician.  Recommendations may be updated based on patient status, additional functional criteria and insurance authorization.    Follow Up Recommendations  Acute inpatient rehab (3hours/day)     Assistance Recommended at Discharge Frequent or constant Supervision/Assistance  Patient can return home with the following  A lot of help with walking and/or transfers;A lot of help with bathing/dressing/bathroom   Equipment Recommendations  Other (comment) (defer)    Recommendations for Other Services Rehab consult    Precautions / Restrictions Precautions Precautions: Fall Restrictions Weight Bearing Restrictions: No       Mobility Bed Mobility Overal bed mobility: Needs Assistance Bed Mobility: Sit to Supine     Supine to sit: Supervision          Transfers Overall transfer level: Needs assistance Equipment used: Rolling walker (2 wheels) Transfers: Bed to  chair/wheelchair/BSC       Step pivot transfers: Min assist           Balance Overall balance assessment: Needs assistance Sitting-balance support: No upper extremity supported, Feet supported Sitting balance-Leahy Scale: Good     Standing balance support: Single extremity supported, During functional activity Standing balance-Leahy Scale: Poor                             ADL either performed or assessed with clinical judgement   ADL Overall ADL's : Needs assistance/impaired                                       General ADL Comments: MIN A + RW for simulated BSC t/f     Vision   Vision Assessment?: Vision impaired- to be further tested in functional context Additional Comments: Completes visual scanning task identifying double digits with 80% accuracy. MOD cues and increased time to complete simple maze          Cognition Arousal/Alertness: Awake/alert Behavior During Therapy: WFL for tasks assessed/performed Overall Cognitive Status: Within Functional Limits for tasks assessed                                          Exercises Exercises: Other exercises Other Exercises Other Exercises: Completed visual scanning activities - line bisection, number identification, and maze            Pertinent Vitals/ Pain  Pain Assessment Pain Assessment: 0-10 Pain Score: 6  Pain Location: R knee Pain Descriptors / Indicators: Aching, Tender, Sore Pain Intervention(s): Limited activity within patient's tolerance, Repositioned   Frequency  Min 4X/week        Progress Toward Goals  OT Goals(current goals can now be found in the care plan section)  Progress towards OT goals: Progressing toward goals  Acute Rehab OT Goals Patient Stated Goal: to go to rehab OT Goal Formulation: With patient/family Time For Goal Achievement: 09/08/22 Potential to Achieve Goals: Good ADL Goals Pt Will Perform Grooming: with  modified independence Pt Will Perform Lower Body Dressing: with modified independence;sit to/from stand Pt Will Transfer to Toilet: with modified independence;ambulating Pt Will Perform Toileting - Clothing Manipulation and hygiene: with modified independence;sit to/from stand Pt/caregiver will Perform Home Exercise Program: Right Upper extremity;Left upper extremity;With written HEP provided  Plan Discharge plan remains appropriate;Frequency remains appropriate    Co-evaluation                 AM-PAC OT "6 Clicks" Daily Activity     Outcome Measure   Help from another person eating meals?: None Help from another person taking care of personal grooming?: A Lot Help from another person toileting, which includes using toliet, bedpan, or urinal?: A Lot Help from another person bathing (including washing, rinsing, drying)?: A Lot Help from another person to put on and taking off regular upper body clothing?: A Little Help from another person to put on and taking off regular lower body clothing?: A Little 6 Click Score: 16    End of Session    OT Visit Diagnosis: Unsteadiness on feet (R26.81);History of falling (Z91.81);Muscle weakness (generalized) (M62.81);Other symptoms and signs involving the nervous system (R29.898)   Activity Tolerance Patient tolerated treatment well   Patient Left in chair;with call bell/phone within reach;with family/visitor present   Nurse Communication          Time: 9379-0240 OT Time Calculation (min): 23 min  Charges: OT General Charges $OT Visit: 1 Visit OT Treatments $Self Care/Home Management : 8-22 mins $Therapeutic Activity: 8-22 mins  Dessie Coma, M.S. OTR/L  08/28/22, 1:47 PM  ascom [phone]

## 2022-08-28 NOTE — Progress Notes (Signed)
Inpatient Rehab Admissions Coordinator:   Awaiting determination from [employer] regarding CIR prior auth request.  Will follow.   Shann Medal, PT, DPT Admissions Coordinator [phone] 08/28/22  9:41 AM

## 2022-08-28 NOTE — H&P (Shared)
Physical Medicine and Rehabilitation Admission H&P    Chief Complaint  Patient presents with   Functional deficits due to stroke    HPI: Jared Cline is a 77 year old male with history of BPH, CKD, severe OA B-Knees, CKD 3b,  PVD, HTN w/recent ED visit for dizziness and presyncope who was admitted to [facility] on  08/24/22 with reports of falls X 2, dizziness, weakness and inability to stand. He was found to have  segmental PE in RUL and LLE. MRI brain revealed acute punctate infarct in posteroinferior pons/right cerebellar peduncle. MRA revealed extensive intracranial atherosclerosis with advance mid basilar and B-PCA branch stenosis, occluded L-VA and moderate R-M1 segment stenosis as well as severe atheromatous stenosis proximal R-ICA and 50% narrowing proximal L-ICA. BLE dopplers were negative for DVT. He was started on treatment dose Lovenox and   2 D echo showed EF 65-70% with no wall abnormality, severe concentric LVH with G1DD. Dr. Quinn Axe recommended addition of ASA once DOAC discontinued and to follow up with neurology after discharge.  He has been limited by right knee pain and swelling w/xrays revealing bulky tricompartmental degeneration with small to moderate suprapatellar joint effusion. Oxycodone added for pain control. PT/OT has been working with patient who continues to be limited by balance deficits, right knee instability with pain,dizziness due to vestibular issues as well as right ataxia. CIR recommended due to functional decline.    Review of Systems  Constitutional:  Negative for fever.  HENT:  Negative for hearing loss.   Eyes:  Negative for blurred vision.  Respiratory:  Negative for cough and shortness of breath.   Cardiovascular:  Negative for chest pain and palpitations.  Gastrointestinal:  Positive for constipation. Negative for abdominal pain, heartburn and vomiting.  Genitourinary:  Positive for dysuria.  Musculoskeletal:  Positive for falls (multiple prior to  admission) and joint pain. Negative for back pain.  Neurological:  Positive for dizziness and weakness.  Psychiatric/Behavioral:  The patient is not nervous/anxious and does not have insomnia.     Past Medical History:  Diagnosis Date   BPH (benign prostatic hypertrophy)    Bradycardia    evaluated by Dr. Nehemiah Massed   Cancer [facility])    prostate   Carpal tunnel syndrome, left    left arm   CKD (chronic kidney disease) stage 2, GFR 60-89 ml/min    Diverticulitis    Gastritis 2017   History of prostate cancer    HNP (herniated nucleus pulposus), lumbar    Hyperlipidemia    Hypertension    IBS (irritable bowel syndrome)    Impotence    Multiple lung nodules    nonspecific, largest 36m in Jan 2016; scanned March 2017; rescan 3-6 months later   OA (osteoarthritis) of knee    right   Renal mass    evaluated; followed by urologist   Sleep apnea    on CPAP   Torn meniscus    x 2; right knee    Past Surgical History:  Procedure Laterality Date   abdominal ultrasound  Oct 2015   Negative for eval for AAA   COLONOSCOPY     KNEE ARTHROSCOPY Right    x 2   LUMBAR LAMINECTOMY     PROSTATECTOMY     robotic     Family History  Problem Relation Age of Onset   Stroke Mother    Hypertension Mother    Aneurysm Father        cerebral   Hypertension Father  Heart disease Paternal Uncle    Alcohol abuse Brother    Cancer Neg Hx    COPD Neg Hx    Diabetes Neg Hx     Social History: Married. Retired (used to work for a Administrator, sports). He  reports that he quit smoking about 51 years ago. His smoking use included cigarettes. He has a 4.50 pack-year smoking history. He has been exposed to tobacco smoke. He has never used smokeless tobacco. He reports that he does not drink alcohol and does not use drugs.   Allergies  Allergen Reactions   Flomax [Tamsulosin Hcl] Shortness Of Breath   Ace Inhibitors Other (See Comments)    Angioedema   Amlodipine Other (See Comments)     Angioedema   Doxazosin    Lipitor [Atorvastatin] Other (See Comments)    Memory loss   Medications Prior to Admission  Medication Sig Dispense Refill   acetaminophen (TYLENOL) 650 MG CR tablet Take 1,300 mg by mouth every 12 (twelve) hours as needed for pain.     ascorbic acid (VITAMIN C) 500 MG tablet Take 500 mg by mouth daily.     aspirin EC 81 MG tablet Take 81 mg by mouth daily.     cholecalciferol (VITAMIN D3) 25 MCG (1000 UNIT) tablet Take 1,000 Units by mouth daily.     cloNIDine (CATAPRES) 0.2 MG tablet Take 1 tablet (0.2 mg total) by mouth 2 (two) times daily. TAKE 1 TABLET(0.2 MG) BY MOUTH TWICE DAILY 180 tablet 1   cyanocobalamin (VITAMIN B12) 1000 MCG tablet Take 1,000 mcg by mouth daily.     hydrALAZINE (APRESOLINE) 10 MG tablet Take 1 tablet (10 mg total) by mouth 2 (two) times daily as needed. Take if blood pressure greater than 140/90. 30 tablet 0   hydrochlorothiazide (HYDRODIURIL) 12.5 MG tablet TAKE 1 TABLET(12.5 MG) BY MOUTH DAILY 90 tablet 1   magnesium oxide (MAG-OX) 400 MG tablet Take 400 mg by mouth daily.     meclizine (ANTIVERT) 25 MG tablet Take 25 mg by mouth 3 (three) times daily as needed for dizziness.     naproxen sodium (ALEVE) 220 MG tablet Take 220 mg by mouth 2 (two) times daily as needed (pain).     olmesartan (BENICAR) 20 MG tablet Take 20 mg by mouth daily.     ondansetron (ZOFRAN-ODT) 4 MG disintegrating tablet Take 4 mg by mouth every 8 (eight) hours as needed for nausea or vomiting.     Potassium Gluconate 550 MG TABS Take 550 mg by mouth daily.     spironolactone (ALDACTONE) 25 MG tablet TAKE 1 TABLET(25 MG) BY MOUTH DAILY 90 tablet 3   losartan (COZAAR) 50 MG tablet  (Patient not taking: Reported on 08/24/2022)     pravastatin (PRAVACHOL) 80 MG tablet Take 1 tablet (80 mg total) by mouth at bedtime. 90 tablet 3      Home: Home Living Family/patient expects to be discharged to:: Private residence Living Arrangements: Spouse/significant  other Available Help at Discharge: Family, Available 24 hours/day Type of Home: House Home Access: Stairs to enter [employer] of Steps: 6 Entrance Stairs-Rails: Left, Right Home Layout: One level Bathroom Shower/Tub: Chiropodist: Handicapped height Bathroom Accessibility: No Home Equipment: Cane - quad, Cane - single point   Functional History: Prior Function Prior Level of Function : History of Falls (last six months), Independent/Modified Independent Mobility Comments: amb with spc for community distances ADLs Comments: MOD I-I in ADL/IADL, cooks,cleans,drives  Functional Status:  Mobility:  Bed Mobility Overal bed mobility: Needs Assistance Bed Mobility: Supine to Sit Supine to sit: Supervision Sit to supine: Min guard, HOB elevated General bed mobility comments: received and left sitting Transfers Overall transfer level: Needs assistance Equipment used: Rolling walker (2 wheels) Transfers: Sit to/from Stand Sit to Stand: Min assist Bed to/from chair/wheelchair/BSC transfer type:: Step pivot Step pivot transfers: Min assist General transfer comment: assist to stabilize RW Ambulation/Gait Ambulation/Gait assistance: Min assist, Min guard, Max assist Gait Distance (Feet): 50 Feet Assistive device: Rolling walker (2 wheels) Gait Pattern/deviations: Step-through pattern, Narrow base of support, Scissoring General Gait Details: Pt was able to ambulate ~ 64 ft with RW prior to requiring seated rest. perform 2 x 50 ft with RW however without AD, Pt has severe balance concerns. HHA +1 attempted however mod assist required to prevent posterior LOB. pt is reliant on RW in standing to maintain standing balance. pt puts forth great effort throughout Gait velocity: decreased    ADL: ADL Overall ADL's : Needs assistance/impaired Upper Body Dressing : Minimal assistance, Sitting Lower Body Dressing: Moderate assistance, Sit to/from stand Lower Body  Dressing Details (indicate cue type and reason): pants Toileting- Clothing Manipulation and Hygiene: Moderate assistance, Sit to/from stand General ADL Comments: MIN A + RW simualted toilet t/f, ~15 ft x2, assist for balance. MIN A hand washing and washing cup standing sink side, intermittent single UE support, assist for balance.  Cognition: Cognition Overall Cognitive Status: Within Functional Limits for tasks assessed Orientation Level: Oriented X4 Cognition Arousal/Alertness: Awake/alert Behavior During Therapy: WFL for tasks assessed/performed Overall Cognitive Status: Within Functional Limits for tasks assessed Area of Impairment: Problem solving Problem Solving: Requires verbal cues, Requires tactile cues General Comments: jovial and supportive family present t/o  Physical Exam: Blood pressure (!) 164/62, pulse 74, temperature 97.8 F (36.6 C), resp. rate 20, height 6' (1.829 m), weight 102.8 kg, SpO2 99 %. Physical Exam  Results for orders placed or performed during the hospital encounter of 08/24/22 (from the past 48 hour(s))  Basic metabolic panel     Status: Abnormal   Collection Time: 08/28/22 12:02 AM  Result Value Ref Range   Sodium 133 (L) 135 - 145 mmol/L   Potassium 4.4 3.5 - 5.1 mmol/L   Chloride 104 98 - 111 mmol/L   CO2 22 22 - 32 mmol/L   Glucose, Bld 98 70 - 99 mg/dL    Comment: Glucose reference range applies only to samples taken after fasting for at least 8 hours.   BUN 32 (H) 8 - 23 mg/dL   Creatinine, Ser 1.40 (H) 0.61 - 1.24 mg/dL   Calcium 8.8 (L) 8.9 - 10.3 mg/dL   GFR, Estimated 52 (L) >60 mL/min    Comment: (NOTE) Calculated using the CKD-EPI Creatinine Equation (2021)    Anion gap 7 5 - 15    Comment: Performed at [facility], [address] [place], [place] [address]  Magnesium     Status: None   Collection Time: 08/28/22 12:02 AM  Result Value Ref Range   Magnesium 2.2 1.7 - 2.4 mg/dL    Comment: Performed at [facility], [address], [place], [place] [address]  Basic metabolic panel     Status: Abnormal   Collection Time: 08/29/22  5:20 AM  Result Value Ref Range   Sodium 131 (L) 135 - 145 mmol/L   Potassium 4.4 3.5 - 5.1 mmol/L   Chloride 102 98 - 111 mmol/L   CO2 20 (L)  22 - 32 mmol/L   Glucose, Bld 96 70 - 99 mg/dL    Comment: Glucose reference range applies only to samples taken after fasting for at least 8 hours.   BUN 32 (H) 8 - 23 mg/dL   Creatinine, Ser 1.31 (H) 0.61 - 1.24 mg/dL   Calcium 9.0 8.9 - 10.3 mg/dL   GFR, Estimated 56 (L) >60 mL/min    Comment: (NOTE) Calculated using the CKD-EPI Creatinine Equation (2021)    Anion gap 9 5 - 15    Comment: Performed at [facility], [address] [place], [place] [address]   No results found.    Blood pressure (!) 164/62, pulse 74, temperature 97.8 F (36.6 C), resp. rate 20, height 6' (1.829 m), weight 102.8 kg, SpO2 99 %.  Medical Problem List and Plan: 1. Functional deficits secondary to ***  -patient may *** shower  -ELOS/Goals: *** 2. New diagnosis PE/Antithrombotics: -DVT/anticoagulation:  Pharmaceutical: Eliquis  -antiplatelet therapy: N/A (ASA after DOAC completed)  3. Pain Management: oxycodone prn for knee pain.  4. Mood/Behavior/Sleep: LCSW to follow for evaluation and support.   --Trazodone prn for sleep.   -antipsychotic agents: N/A 5. Neuropsych/cognition: This patient is capable of making decisions on his own behalf. 6. Skin/Wound Care: Routine pressure relief measures.  7. Fluids/Electrolytes/Nutrition: Monitor I/O. Check CMET in am.  8. HTN: Monitor BP TID- continue 9. CKD 3A: Baseline SCr 1.4. Reports mild dysuria since admission. Encourage fluid intake.  --BUN trending up- recheck in am.  10. OSA: Continue CPAP 11. Prostate cancer s/p prostatectomy: 12. Right knee OA/effusion: Used to get his knee injected every 6 months and missed an appt last week. --Add voltaren gel QID with aquatherapy prn.   --Oxycodone prn pain--may need premedication.  --consult ortho for aspiration.  13. Hyponatremia: Borderline low. Continue to monitor sodium levels.  14. Acute blood loss anemia: Recheck in am 15. Constipation: Has not had BM in 7 days but passing gas and no pain or nausea. --Will try sorbitol tonight.    ***  Bary Leriche, PA-C 08/29/2022

## 2022-08-29 ENCOUNTER — Other Ambulatory Visit: Payer: Self-pay

## 2022-08-29 ENCOUNTER — Encounter (HOSPITAL_COMMUNITY): Payer: Self-pay | Admitting: Physical Medicine & Rehabilitation

## 2022-08-29 ENCOUNTER — Inpatient Hospital Stay (HOSPITAL_COMMUNITY)
Admission: RE | Admit: 2022-08-29 | Discharge: 2022-09-10 | DRG: 092 | Disposition: A | Discharge status: Home Health | Payer: Medicare Other | Source: Other Acute Inpatient Hospital | Attending: Physical Medicine & Rehabilitation | Admitting: Physical Medicine & Rehabilitation

## 2022-08-29 DIAGNOSIS — R42 Dizziness and giddiness: Secondary | ICD-10-CM

## 2022-08-29 DIAGNOSIS — Z888 Allergy status to other drugs, medicaments and biological substances status: Secondary | ICD-10-CM

## 2022-08-29 DIAGNOSIS — N1832 Chronic kidney disease, stage 3b: Secondary | ICD-10-CM | POA: Diagnosis not present

## 2022-08-29 DIAGNOSIS — I639 Cerebral infarction, unspecified: Principal | ICD-10-CM | POA: Diagnosis present

## 2022-08-29 DIAGNOSIS — M170 Bilateral primary osteoarthritis of knee: Secondary | ICD-10-CM | POA: Diagnosis present

## 2022-08-29 DIAGNOSIS — Z8546 Personal history of malignant neoplasm of prostate: Secondary | ICD-10-CM

## 2022-08-29 DIAGNOSIS — M25361 Other instability, right knee: Secondary | ICD-10-CM | POA: Diagnosis present

## 2022-08-29 DIAGNOSIS — R531 Weakness: Secondary | ICD-10-CM | POA: Diagnosis not present

## 2022-08-29 DIAGNOSIS — D62 Acute posthemorrhagic anemia: Secondary | ICD-10-CM | POA: Diagnosis present

## 2022-08-29 DIAGNOSIS — R918 Other nonspecific abnormal finding of lung field: Secondary | ICD-10-CM | POA: Diagnosis present

## 2022-08-29 DIAGNOSIS — M25461 Effusion, right knee: Secondary | ICD-10-CM | POA: Diagnosis not present

## 2022-08-29 DIAGNOSIS — I129 Hypertensive chronic kidney disease with stage 1 through stage 4 chronic kidney disease, or unspecified chronic kidney disease: Secondary | ICD-10-CM | POA: Diagnosis present

## 2022-08-29 DIAGNOSIS — M25561 Pain in right knee: Secondary | ICD-10-CM

## 2022-08-29 DIAGNOSIS — I739 Peripheral vascular disease, unspecified: Secondary | ICD-10-CM | POA: Diagnosis present

## 2022-08-29 DIAGNOSIS — Z7982 Long term (current) use of aspirin: Secondary | ICD-10-CM | POA: Diagnosis not present

## 2022-08-29 DIAGNOSIS — G4733 Obstructive sleep apnea (adult) (pediatric): Secondary | ICD-10-CM

## 2022-08-29 DIAGNOSIS — Z8249 Family history of ischemic heart disease and other diseases of the circulatory system: Secondary | ICD-10-CM

## 2022-08-29 DIAGNOSIS — R2689 Other abnormalities of gait and mobility: Principal | ICD-10-CM | POA: Diagnosis present

## 2022-08-29 DIAGNOSIS — I10 Essential (primary) hypertension: Secondary | ICD-10-CM

## 2022-08-29 DIAGNOSIS — Z79899 Other long term (current) drug therapy: Secondary | ICD-10-CM | POA: Diagnosis not present

## 2022-08-29 DIAGNOSIS — M1711 Unilateral primary osteoarthritis, right knee: Secondary | ICD-10-CM | POA: Diagnosis not present

## 2022-08-29 DIAGNOSIS — Z9079 Acquired absence of other genital organ(s): Secondary | ICD-10-CM | POA: Diagnosis not present

## 2022-08-29 DIAGNOSIS — E785 Hyperlipidemia, unspecified: Secondary | ICD-10-CM | POA: Diagnosis present

## 2022-08-29 DIAGNOSIS — R109 Unspecified abdominal pain: Secondary | ICD-10-CM | POA: Diagnosis not present

## 2022-08-29 DIAGNOSIS — K30 Functional dyspepsia: Secondary | ICD-10-CM | POA: Diagnosis not present

## 2022-08-29 DIAGNOSIS — N1831 Chronic kidney disease, stage 3a: Secondary | ICD-10-CM | POA: Diagnosis not present

## 2022-08-29 DIAGNOSIS — R300 Dysuria: Secondary | ICD-10-CM | POA: Diagnosis not present

## 2022-08-29 DIAGNOSIS — K5900 Constipation, unspecified: Secondary | ICD-10-CM | POA: Diagnosis not present

## 2022-08-29 DIAGNOSIS — R296 Repeated falls: Secondary | ICD-10-CM | POA: Diagnosis not present

## 2022-08-29 DIAGNOSIS — Z823 Family history of stroke: Secondary | ICD-10-CM | POA: Diagnosis not present

## 2022-08-29 DIAGNOSIS — I69393 Ataxia following cerebral infarction: Secondary | ICD-10-CM | POA: Diagnosis not present

## 2022-08-29 DIAGNOSIS — Z87891 Personal history of nicotine dependence: Secondary | ICD-10-CM

## 2022-08-29 DIAGNOSIS — E871 Hypo-osmolality and hyponatremia: Secondary | ICD-10-CM | POA: Diagnosis not present

## 2022-08-29 DIAGNOSIS — N400 Enlarged prostate without lower urinary tract symptoms: Secondary | ICD-10-CM | POA: Diagnosis not present

## 2022-08-29 DIAGNOSIS — G459 Transient cerebral ischemic attack, unspecified: Secondary | ICD-10-CM | POA: Diagnosis not present

## 2022-08-29 DIAGNOSIS — E782 Mixed hyperlipidemia: Secondary | ICD-10-CM

## 2022-08-29 DIAGNOSIS — Z743 Need for continuous supervision: Secondary | ICD-10-CM | POA: Diagnosis not present

## 2022-08-29 LAB — BASIC METABOLIC PANEL
Anion gap: 9 (ref 5–15)
BUN: 32 mg/dL — ABNORMAL HIGH (ref 8–23)
CO2: 20 mmol/L — ABNORMAL LOW (ref 22–32)
Calcium: 9 mg/dL (ref 8.9–10.3)
Chloride: 102 mmol/L (ref 98–111)
Creatinine, Ser: 1.31 mg/dL — ABNORMAL HIGH (ref 0.61–1.24)
GFR, Estimated: 56 mL/min — ABNORMAL LOW (ref >60)
Glucose, Bld: 96 mg/dL (ref 70–99)
Potassium: 4.4 mmol/L (ref 3.5–5.1)
Sodium: 131 mmol/L — ABNORMAL LOW (ref 135–145)

## 2022-08-29 MED ORDER — SORBITOL 70 % SOLN
60.0000 mL | Freq: Once | Status: AC
  Administered 2022-08-29: 60 mL via ORAL
  Filled 2022-08-29: qty 60

## 2022-08-29 MED ORDER — ALUM & MAG HYDROXIDE-SIMETH 200-200-20 MG/5ML PO SUSP
30.0000 mL | ORAL | Status: DC | PRN
  Administered 2022-09-03 – 2022-09-09 (×2): 30 mL via ORAL
  Filled 2022-08-29 (×2): qty 30

## 2022-08-29 MED ORDER — TRAZODONE HCL 50 MG PO TABS: 50.0000 mg | ORAL_TABLET | Freq: Every evening | ORAL | Status: DC | PRN

## 2022-08-29 MED ORDER — FLEET ENEMA 7-19 GM/118ML RE ENEM: 1.0000 | ENEMA | Freq: Once | RECTAL | Status: DC | PRN

## 2022-08-29 MED ORDER — SENNOSIDES-DOCUSATE SODIUM 8.6-50 MG PO TABS: 1.0000 | ORAL_TABLET | Freq: Two times a day (BID) | ORAL | Status: DC

## 2022-08-29 MED ORDER — OXYCODONE HCL 5 MG PO TABS: 5.0000 mg | ORAL_TABLET | Freq: Three times a day (TID) | ORAL | Status: DC | PRN

## 2022-08-29 MED ORDER — SPIRONOLACTONE 25 MG PO TABS
25.0000 mg | ORAL_TABLET | Freq: Every day | ORAL | Status: DC
  Administered 2022-08-30 – 2022-09-10 (×12): 25 mg via ORAL
  Filled 2022-08-29 (×12): qty 1

## 2022-08-29 MED ORDER — APIXABAN 5 MG PO TABS: 10.0000 mg | ORAL_TABLET | Freq: Two times a day (BID) | ORAL | Status: DC

## 2022-08-29 MED ORDER — PRAVASTATIN SODIUM 40 MG PO TABS
80.0000 mg | ORAL_TABLET | Freq: Every day | ORAL | Status: DC
  Administered 2022-08-29 – 2022-09-09 (×12): 80 mg via ORAL
  Filled 2022-08-29 (×12): qty 2

## 2022-08-29 MED ORDER — ACETAMINOPHEN ER 650 MG PO TBCR: 1300.0000 mg | EXTENDED_RELEASE_TABLET | Freq: Two times a day (BID) | ORAL | Status: AC | PRN

## 2022-08-29 MED ORDER — PROCHLORPERAZINE EDISYLATE 10 MG/2ML IJ SOLN: 5.0000 mg | Freq: Four times a day (QID) | INTRAMUSCULAR | Status: DC | PRN

## 2022-08-29 MED ORDER — HYDRALAZINE HCL 10 MG PO TABS
10.0000 mg | ORAL_TABLET | Freq: Three times a day (TID) | ORAL | Status: DC
  Administered 2022-08-30 – 2022-09-09 (×14): 10 mg via ORAL
  Filled 2022-08-29 (×30): qty 1

## 2022-08-29 MED ORDER — OXYCODONE HCL 5 MG PO TABS
5.0000 mg | ORAL_TABLET | Freq: Every day | ORAL | Status: DC
  Administered 2022-08-30 – 2022-09-10 (×12): 5 mg via ORAL
  Filled 2022-08-29 (×12): qty 1

## 2022-08-29 MED ORDER — PROCHLORPERAZINE 25 MG RE SUPP: 12.5000 mg | Freq: Four times a day (QID) | RECTAL | Status: DC | PRN

## 2022-08-29 MED ORDER — APIXABAN 5 MG PO TABS
10.0000 mg | ORAL_TABLET | Freq: Two times a day (BID) | ORAL | Status: AC
  Administered 2022-08-29 – 2022-08-31 (×5): 10 mg via ORAL
  Filled 2022-08-29 (×5): qty 2

## 2022-08-29 MED ORDER — DIPHENHYDRAMINE HCL 12.5 MG/5ML PO ELIX: 12.5000 mg | ORAL_SOLUTION | Freq: Four times a day (QID) | ORAL | Status: DC | PRN

## 2022-08-29 MED ORDER — APIXABAN 5 MG PO TABS
5.0000 mg | ORAL_TABLET | Freq: Two times a day (BID) | ORAL | Status: DC
  Administered 2022-09-01 – 2022-09-10 (×19): 5 mg via ORAL
  Filled 2022-08-29 (×19): qty 1

## 2022-08-29 MED ORDER — ACETAMINOPHEN 325 MG PO TABS
325.0000 mg | ORAL_TABLET | ORAL | Status: DC | PRN
  Administered 2022-09-07: 650 mg via ORAL
  Filled 2022-08-29: qty 2

## 2022-08-29 MED ORDER — POLYETHYLENE GLYCOL 3350 17 G PO PACK
17.0000 g | PACK | Freq: Every day | ORAL | Status: DC
  Administered 2022-08-30 – 2022-09-08 (×10): 17 g via ORAL
  Filled 2022-08-29 (×12): qty 1

## 2022-08-29 MED ORDER — HYDRALAZINE HCL 10 MG PO TABS: 10.0000 mg | ORAL_TABLET | Freq: Three times a day (TID) | ORAL | Status: DC

## 2022-08-29 MED ORDER — IPRATROPIUM-ALBUTEROL 0.5-2.5 (3) MG/3ML IN SOLN: 3.0000 mL | RESPIRATORY_TRACT | Status: DC | PRN

## 2022-08-29 MED ORDER — APIXABAN 5 MG PO TABS: 5.0000 mg | ORAL_TABLET | Freq: Two times a day (BID) | ORAL | Status: DC

## 2022-08-29 MED ORDER — IRBESARTAN 75 MG PO TABS
150.0000 mg | ORAL_TABLET | Freq: Every day | ORAL | Status: DC
  Administered 2022-08-30 – 2022-09-10 (×12): 150 mg via ORAL
  Filled 2022-08-29 (×12): qty 2

## 2022-08-29 MED ORDER — PROCHLORPERAZINE MALEATE 5 MG PO TABS
5.0000 mg | ORAL_TABLET | Freq: Four times a day (QID) | ORAL | Status: DC | PRN
  Administered 2022-08-30 – 2022-09-03 (×2): 10 mg via ORAL
  Filled 2022-08-29 (×2): qty 2

## 2022-08-29 MED ORDER — CLONIDINE HCL 0.1 MG PO TABS
0.3000 mg | ORAL_TABLET | Freq: Two times a day (BID) | ORAL | Status: DC
  Administered 2022-08-29 – 2022-09-10 (×24): 0.3 mg via ORAL
  Filled 2022-08-29 (×24): qty 3

## 2022-08-29 MED ORDER — GUAIFENESIN-DM 100-10 MG/5ML PO SYRP: 5.0000 mL | ORAL_SOLUTION | Freq: Four times a day (QID) | ORAL | Status: DC | PRN

## 2022-08-29 MED ORDER — POLYETHYLENE GLYCOL 3350 17 G PO PACK: 17.0000 g | PACK | Freq: Every day | ORAL | Status: DC | PRN

## 2022-08-29 MED ORDER — OXYCODONE HCL 5 MG PO TABS
5.0000 mg | ORAL_TABLET | ORAL | Status: DC | PRN
  Filled 2022-08-29: qty 1

## 2022-08-29 MED ORDER — POLYETHYLENE GLYCOL 3350 17 G PO PACK: 17.0000 g | PACK | Freq: Every day | ORAL | Status: DC

## 2022-08-29 MED ORDER — BISACODYL 10 MG RE SUPP: 10.0000 mg | Freq: Every day | RECTAL | Status: DC | PRN

## 2022-08-29 MED ORDER — DICLOFENAC SODIUM 1 % EX GEL
2.0000 g | Freq: Four times a day (QID) | CUTANEOUS | Status: DC
  Administered 2022-08-29 – 2022-09-09 (×25): 2 g via TOPICAL
  Filled 2022-08-29: qty 100

## 2022-08-29 MED ORDER — TRAZODONE HCL 50 MG PO TABS: 25.0000 mg | ORAL_TABLET | Freq: Every evening | ORAL | Status: DC | PRN

## 2022-08-29 MED ORDER — SENNOSIDES-DOCUSATE SODIUM 8.6-50 MG PO TABS
2.0000 | ORAL_TABLET | Freq: Every day | ORAL | Status: DC
  Administered 2022-08-29 – 2022-09-01 (×4): 2 via ORAL
  Filled 2022-08-29 (×4): qty 2

## 2022-08-29 NOTE — Progress Notes (Signed)
Occupational Therapy Treatment Patient Details Name: Jared Cline MRN: [id number] DOB: [birth year] Today's Date: 08/29/2022   History of present illness Pt is a 77 year old male admitted with Punctate acute infarct in the posteroinferior pons/right cerebellar peduncle; PMH significant for CAD, HTN, HLD CKD 3A, OSA on CPAP, history of prostate cancer s/p prostatectomy, complex renal cysts followed annually by urology.   OT comments  Jared Cline was seen for OT treatment on this date. Upon arrival to room pt seated in chair, family at bed side, agreeable to tx. Pt requires MIN A + RW for mobility ~15 ft x2, assist for balance. MIN A hand washing and washing cup standing sink side, intermittent single UE support, assist for balance. Completed standing balance tasks reaching outside BOS L+R with increased unsteadiness on R side. Attempted steps without AD, tolerates ~5 ft MOD A + HHA however R knee buckling noted, improved with RW use. Pt making good progress toward goals, will continue to follow POC. Discharge recommendation remains appropriate.     Recommendations for follow up therapy are one component of a multi-disciplinary discharge planning process, led by the attending physician.  Recommendations may be updated based on patient status, additional functional criteria and insurance authorization.    Follow Up Recommendations  Acute inpatient rehab (3hours/day)     Assistance Recommended at Discharge Frequent or constant Supervision/Assistance  Patient can return home with the following  A lot of help with walking and/or transfers;A lot of help with bathing/dressing/bathroom   Equipment Recommendations  Other (comment) (defer)    Recommendations for Other Services      Precautions / Restrictions Precautions Precautions: Fall Restrictions Weight Bearing Restrictions: No       Mobility Bed Mobility               General bed mobility comments: received and left sitting     Transfers Overall transfer level: Needs assistance Equipment used: Rolling walker (2 wheels) Transfers: Sit to/from Stand Sit to Stand: Min assist           General transfer comment: assist to stabilize RW     Balance Overall balance assessment: Needs assistance Sitting-balance support: No upper extremity supported, Feet supported Sitting balance-Leahy Scale: Good     Standing balance support: No upper extremity supported, During functional activity Standing balance-Leahy Scale: Poor                             ADL either performed or assessed with clinical judgement   ADL Overall ADL's : Needs assistance/impaired                                       General ADL Comments: MIN A + RW simualted toilet t/f, ~15 ft x2, assist for balance. MIN A hand washing and washing cup standing sink side, intermittent single UE support, assist for balance. standing balance tasks reaching outside BOS L+R with increased unsteadiness on R side      Cognition Arousal/Alertness: Awake/alert Behavior During Therapy: WFL for tasks assessed/performed Overall Cognitive Status: Within Functional Limits for tasks assessed                                 General Comments: jovial and supportive family present t/o  General Comments standing balance tasks reaching outside BOS L+R with increased unsteadiness on R side    Pertinent Vitals/ Pain       Pain Assessment Pain Assessment: 0-10 Pain Score: 10-Worst pain ever Pain Location: R knee Pain Descriptors / Indicators: Aching, Tender, Sore Pain Intervention(s): Limited activity within patient's tolerance, Patient requesting pain meds-RN notified, Ice applied   Frequency  Min 4X/week        Progress Toward Goals  OT Goals(current goals can now be found in the care plan section)  Progress towards OT goals: Progressing toward goals  Acute Rehab OT Goals Patient Stated Goal: to  go to rehab OT Goal Formulation: With patient/family Time For Goal Achievement: 09/08/22 Potential to Achieve Goals: Good ADL Goals Pt Will Perform Grooming: with modified independence Pt Will Perform Lower Body Dressing: with modified independence;sit to/from stand Pt Will Transfer to Toilet: with modified independence;ambulating Pt Will Perform Toileting - Clothing Manipulation and hygiene: with modified independence;sit to/from stand Pt/caregiver will Perform Home Exercise Program: Right Upper extremity;Left upper extremity;With written HEP provided  Plan Discharge plan remains appropriate;Frequency remains appropriate    Co-evaluation                 AM-PAC OT "6 Clicks" Daily Activity     Outcome Measure   Help from another person eating meals?: None Help from another person taking care of personal grooming?: A Little Help from another person toileting, which includes using toliet, bedpan, or urinal?: A Lot Help from another person bathing (including washing, rinsing, drying)?: A Lot Help from another person to put on and taking off regular upper body clothing?: A Little Help from another person to put on and taking off regular lower body clothing?: A Little 6 Click Score: 17    End of Session Equipment Utilized During Treatment: Gait belt;Rolling walker (2 wheels)  OT Visit Diagnosis: Unsteadiness on feet (R26.81);History of falling (Z91.81);Muscle weakness (generalized) (M62.81);Other symptoms and signs involving the nervous system (R29.898)   Activity Tolerance Patient limited by pain;Patient tolerated treatment well   Patient Left in chair;with call bell/phone within reach;with family/visitor present   Nurse Communication          Time: 1540-0867 OT Time Calculation (min): 44 min  Charges: OT General Charges $OT Visit: 1 Visit OT Treatments $Self Care/Home Management : 23-37 mins $Therapeutic Activity: 8-22 mins  Dessie Coma, M.S. OTR/L  08/29/22,  10:39 AM  ascom [phone]

## 2022-08-29 NOTE — H&P (Signed)
Expand All Collapse All      Physical Medicine and Rehabilitation Admission H&P        Chief Complaint  Patient presents with   Functional deficits due to stroke      HPI: Jared Cline is a 77 year old male with history of BPH, CKD, severe OA B-Knees, CKD 3b,  PVD, HTN w/recent ED visit for dizziness and presyncope who was admitted to [facility] on  08/24/22 with reports of falls X 2, dizziness, weakness and inability to stand. He was found to have  segmental PE in RUL and LLE. MRI brain revealed acute punctate infarct in posteroinferior pons/right cerebellar peduncle. MRA revealed extensive intracranial atherosclerosis with advance mid basilar and B-PCA branch stenosis, occluded L-VA and moderate R-M1 segment stenosis as well as severe atheromatous stenosis proximal R-ICA and 50% narrowing proximal L-ICA. BLE dopplers were negative for DVT. He was started on treatment dose Lovenox and    2 D echo showed EF 65-70% with no wall abnormality, severe concentric LVH with G1DD. Dr. Quinn Axe recommended addition of ASA once DOAC discontinued and to follow up with neurology after discharge.  He has been limited by right knee pain and swelling w/xrays revealing bulky tricompartmental degeneration with small to moderate suprapatellar joint effusion. Oxycodone added for pain control. PT/OT has been working with patient who continues to be limited by balance deficits, right knee instability with pain,dizziness due to vestibular issues as well as right ataxia. CIR recommended due to functional decline.      Review of Systems  Constitutional:  Negative for fever.  HENT:  Negative for hearing loss.   Eyes:  Negative for blurred vision.  Respiratory:  Negative for cough and shortness of breath.   Cardiovascular:  Negative for chest pain and palpitations.  Gastrointestinal:  Positive for constipation. Negative for abdominal pain, heartburn and vomiting.  Genitourinary:  Positive for dysuria.  Musculoskeletal:   Positive for falls (multiple prior to admission) and joint pain. Negative for back pain.  Neurological:  Positive for dizziness and weakness.  Psychiatric/Behavioral:  The patient is not nervous/anxious and does not have insomnia.           Past Medical History:  Diagnosis Date   BPH (benign prostatic hypertrophy)     Bradycardia      evaluated by Dr. Nehemiah Massed   Cancer [facility]      prostate   Carpal tunnel syndrome, left      left arm   CKD (chronic kidney disease) stage 2, GFR 60-89 ml/min     Diverticulitis     Gastritis 2017   History of prostate cancer     HNP (herniated nucleus pulposus), lumbar     Hyperlipidemia     Hypertension     IBS (irritable bowel syndrome)     Impotence     Multiple lung nodules      nonspecific, largest 34m in Jan 2016; scanned March 2017; rescan 3-6 months later   OA (osteoarthritis) of knee      right   Renal mass      evaluated; followed by urologist   Sleep apnea      on CPAP   Torn meniscus      x 2; right knee           Past Surgical History:  Procedure Laterality Date   abdominal ultrasound   Oct 2015    Negative for eval for AAA   COLONOSCOPY       KNEE ARTHROSCOPY  Right      x 2   LUMBAR LAMINECTOMY       PROSTATECTOMY        robotic             Family History  Problem Relation Age of Onset   Stroke Mother     Hypertension Mother     Aneurysm Father          cerebral   Hypertension Father     Heart disease Paternal Uncle     Alcohol abuse Brother     Cancer Neg Hx     COPD Neg Hx     Diabetes Neg Hx        Social History: Married. Retired (used to work for a Administrator, sports). He  reports that he quit smoking about 51 years ago. His smoking use included cigarettes. He has a 4.50 pack-year smoking history. He has been exposed to tobacco smoke. He has never used smokeless tobacco. He reports that he does not drink alcohol and does not use drugs.          Allergies  Allergen Reactions   Flomax [Tamsulosin Hcl]  Shortness Of Breath   Ace Inhibitors Other (See Comments)      Angioedema   Amlodipine Other (See Comments)      Angioedema   Doxazosin     Lipitor [Atorvastatin] Other (See Comments)      Memory loss          Medications Prior to Admission  Medication Sig Dispense Refill   acetaminophen (TYLENOL) 650 MG CR tablet Take 1,300 mg by mouth every 12 (twelve) hours as needed for pain.       ascorbic acid (VITAMIN C) 500 MG tablet Take 500 mg by mouth daily.       aspirin EC 81 MG tablet Take 81 mg by mouth daily.       cholecalciferol (VITAMIN D3) 25 MCG (1000 UNIT) tablet Take 1,000 Units by mouth daily.       cloNIDine (CATAPRES) 0.2 MG tablet Take 1 tablet (0.2 mg total) by mouth 2 (two) times daily. TAKE 1 TABLET(0.2 MG) BY MOUTH TWICE DAILY 180 tablet 1   cyanocobalamin (VITAMIN B12) 1000 MCG tablet Take 1,000 mcg by mouth daily.       hydrALAZINE (APRESOLINE) 10 MG tablet Take 1 tablet (10 mg total) by mouth 2 (two) times daily as needed. Take if blood pressure greater than 140/90. 30 tablet 0   hydrochlorothiazide (HYDRODIURIL) 12.5 MG tablet TAKE 1 TABLET(12.5 MG) BY MOUTH DAILY 90 tablet 1   magnesium oxide (MAG-OX) 400 MG tablet Take 400 mg by mouth daily.       meclizine (ANTIVERT) 25 MG tablet Take 25 mg by mouth 3 (three) times daily as needed for dizziness.       naproxen sodium (ALEVE) 220 MG tablet Take 220 mg by mouth 2 (two) times daily as needed (pain).       olmesartan (BENICAR) 20 MG tablet Take 20 mg by mouth daily.       ondansetron (ZOFRAN-ODT) 4 MG disintegrating tablet Take 4 mg by mouth every 8 (eight) hours as needed for nausea or vomiting.       Potassium Gluconate 550 MG TABS Take 550 mg by mouth daily.       spironolactone (ALDACTONE) 25 MG tablet TAKE 1 TABLET(25 MG) BY MOUTH DAILY 90 tablet 3   losartan (COZAAR) 50 MG tablet  (Patient not taking: Reported on 08/24/2022)  pravastatin (PRAVACHOL) 80 MG tablet Take 1 tablet (80 mg total) by mouth at bedtime. 90  tablet 3          Home: Home Living Family/patient expects to be discharged to:: Private residence Living Arrangements: Spouse/significant other Available Help at Discharge: Family, Available 24 hours/day Type of Home: House Home Access: Stairs to enter [employer] of Steps: 6 Entrance Stairs-Rails: Left, Right Home Layout: One level Bathroom Shower/Tub: Chiropodist: Handicapped height Bathroom Accessibility: No Home Equipment: Radio producer - quad, Radio producer - single point   Functional History: Prior Function Prior Level of Function : History of Falls (last six months), Independent/Modified Independent Mobility Comments: amb with spc for community distances ADLs Comments: MOD I-I in ADL/IADL, cooks,cleans,drives   Functional Status:  Mobility: Bed Mobility Overal bed mobility: Needs Assistance Bed Mobility: Supine to Sit Supine to sit: Supervision Sit to supine: Min guard, HOB elevated General bed mobility comments: received and left sitting Transfers Overall transfer level: Needs assistance Equipment used: Rolling walker (2 wheels) Transfers: Sit to/from Stand Sit to Stand: Min assist Bed to/from chair/wheelchair/BSC transfer type:: Step pivot Step pivot transfers: Min assist General transfer comment: assist to stabilize RW Ambulation/Gait Ambulation/Gait assistance: Min assist, Min guard, Max assist Gait Distance (Feet): 50 Feet Assistive device: Rolling walker (2 wheels) Gait Pattern/deviations: Step-through pattern, Narrow base of support, Scissoring General Gait Details: Pt was able to ambulate ~ 58 ft with RW prior to requiring seated rest. perform 2 x 50 ft with RW however without AD, Pt has severe balance concerns. HHA +1 attempted however mod assist required to prevent posterior LOB. pt is reliant on RW in standing to maintain standing balance. pt puts forth great effort throughout Gait velocity: decreased   ADL: ADL Overall ADL's : Needs  assistance/impaired Upper Body Dressing : Minimal assistance, Sitting Lower Body Dressing: Moderate assistance, Sit to/from stand Lower Body Dressing Details (indicate cue type and reason): pants Toileting- Clothing Manipulation and Hygiene: Moderate assistance, Sit to/from stand General ADL Comments: MIN A + RW simualted toilet t/f, ~15 ft x2, assist for balance. MIN A hand washing and washing cup standing sink side, intermittent single UE support, assist for balance.   Cognition: Cognition Overall Cognitive Status: Within Functional Limits for tasks assessed Orientation Level: Oriented X4 Cognition Arousal/Alertness: Awake/alert Behavior During Therapy: WFL for tasks assessed/performed Overall Cognitive Status: Within Functional Limits for tasks assessed Area of Impairment: Problem solving Problem Solving: Requires verbal cues, Requires tactile cues General Comments: jovial and supportive family present t/o   Physical Exam: Blood pressure (!) 164/62, pulse 74, temperature 97.8 F (36.6 C), resp. rate 20, height 6' (1.829 m), weight 102.8 kg, SpO2 99 %. Physical Exam  Constitutional: No apparent distress. Appropriate appearance for age. +Overweight HENT: No JVD. Neck Supple. Trachea midline. Atraumatic, normocephalic. +Glasses Eyes: PERRLA. EOMI. Visual fields grossly intact. Mild horizontal nystagmus on L gaze Cardiovascular: RRR, no murmurs/rub/gallops. No Edema. Peripheral pulses 2+  Respiratory: CTAB. No rales, rhonchi, or wheezing. On RA.  Skin: C/D/I. No apparent lesions. MSK:      + Large palpable effusion in R knee superior to patella, lateral > medial. R knee flexion ROM limited by pain; extension WNL.       Strength:                RUE: 5/5 SA, 5/5 EF, 5/5 EE, 5/5 WE, 5/5 FF, 5/5 FA                 LUE:  5/5 SA, 5/5 EF, 5/5 EE, 5/5 WE, 5/5 FF, 5/5 FA                 RLE: 5-/5 HF, 3-/5 KE (limited by pain), 5/5 DF, 5/5 EHL, 5/5 PF                 LLE:  5/5 HF, 5/5 KE, 5/5  DF, 5/5 EHL, 5/5 PF   Neurologic exam:  Cognition: AAO to person, place, time and event.  Language: Fluent, No substitutions or neoglisms. No dysarthria. Names 3/3 objects correctly.  Memory: Recalls 3/3 objects at 5 minutes. No apparent deficits  Insight: Good insight into current condition.  Mood: Pleasant affect, appropriate mood.  Sensation: To light touch intact in BL UEs and LEs  Reflexes: 2+ in BL UE and LEs. Negative Hoffman's and babinski signs bilaterally.  CN: 2-12 grossly intact.  Coordination: No apparent tremors. Mild L>R  ataxia on FTN, + mild ataxia on L HTS.  Spasticity: MAS 0 in all extremities.        Lab Results Last 48 Hours        Results for orders placed or performed during the hospital encounter of 08/24/22 (from the past 48 hour(s))  Basic metabolic panel     Status: Abnormal    Collection Time: 08/28/22 12:02 AM  Result Value Ref Range    Sodium 133 (L) 135 - 145 mmol/L    Potassium 4.4 3.5 - 5.1 mmol/L    Chloride 104 98 - 111 mmol/L    CO2 22 22 - 32 mmol/L    Glucose, Bld 98 70 - 99 mg/dL      Comment: Glucose reference range applies only to samples taken after fasting for at least 8 hours.    BUN 32 (H) 8 - 23 mg/dL    Creatinine, Ser 1.40 (H) 0.61 - 1.24 mg/dL    Calcium 8.8 (L) 8.9 - 10.3 mg/dL    GFR, Estimated 52 (L) >60 mL/min      Comment: (NOTE) Calculated using the CKD-EPI Creatinine Equation (2021)      Anion gap 7 5 - 15      Comment: Performed at [facility], [address] [place], [place] [address]  Magnesium     Status: None    Collection Time: 08/28/22 12:02 AM  Result Value Ref Range    Magnesium 2.2 1.7 - 2.4 mg/dL      Comment: Performed at [facility], [address] [place], [place] [address]  Basic metabolic panel     Status: Abnormal    Collection Time: 08/29/22  5:20 AM  Result Value Ref Range    Sodium 131 (L) 135 - 145 mmol/L    Potassium 4.4 3.5 - 5.1 mmol/L    Chloride 102 98 - 111 mmol/L     CO2 20 (L) 22 - 32 mmol/L    Glucose, Bld 96 70 - 99 mg/dL      Comment: Glucose reference range applies only to samples taken after fasting for at least 8 hours.    BUN 32 (H) 8 - 23 mg/dL    Creatinine, Ser 1.31 (H) 0.61 - 1.24 mg/dL    Calcium 9.0 8.9 - 10.3 mg/dL    GFR, Estimated 56 (L) >60 mL/min      Comment: (NOTE) Calculated using the CKD-EPI Creatinine Equation (2021)      Anion gap 9 5 - 15      Comment: Performed at [facility], [place], [place] [address]      Imaging Results (Last 48 hours)  No results found.         Blood pressure (!) 164/62, pulse 74, temperature 97.8 F (36.6 C), resp. rate 20, height 6' (1.829 m), weight 102.8 kg, SpO2 99 %.   Medical Problem List and Plan: 1. Functional deficits secondary to R cerebellar CVA             -patient may  shower             -ELOS/Goals: 10-14 days, supervision PT/OT, Ind SLP             - Primary barriers: balance and coordination deficits, R knee pain, 5 STE home 2. New diagnosis segmental PE/Antithrombotics: -DVT/anticoagulation:  Pharmaceutical: Eliquis             -antiplatelet therapy: N/A (ASA after DOAC completed)  3. Pain Management: oxycodone prn for knee pain.              - Pre-medicate AM therapies to allow participation             - See management of R knee effusion below 4. Mood/Behavior/Sleep: LCSW to follow for evaluation and support.              --Trazodone prn for sleep.              -antipsychotic agents: N/A 5. Neuropsych/cognition: This patient is capable of making decisions on his own behalf. 6. Skin/Wound Care: Routine pressure relief measures.  7. Fluids/Electrolytes/Nutrition: Monitor I/O. Check CMET in am.  8. HTN: Monitor BP TID- continue    08/29/2022    9:53 PM 08/29/2022    8:39 PM 08/29/2022    4:27 PM  Vitals with BMI  Systolic [phone]  Diastolic 70 72 70  Pulse  65 59    9. CKD 3A: Baseline SCr 1.3-1.4. Reports mild dysuria since  admission. Encourage fluid intake.  --BUN trending up- recheck in am.  10. OSA: Continue CPAP 11. Prostate cancer s/p prostatectomy: 12. Right knee OA/effusion: Used to get his knee injected every 6 months and missed an appt last week. --Add voltaren gel QID with aquatherapy prn.  --Oxycodone prn pain--may need premedication.  --consult ortho for aspiration. Pending results, would consider steroid injection (was due for OP bilateral injections prior to admission) 13. Hyponatremia: Borderline low. Continue to monitor sodium levels.  14. Acute blood loss anemia: Recheck in am 15. Constipation: Has not had BM in 7 days but passing gas and no pain or nausea. --Will try sorbitol tonight.      Bary Leriche, PA-C 08/29/2022  I have examined the patient independently and edited the note for HPI, ROS, exam, assessment, and plan as appropriate. I am in agreement with the above recommendations.   Gertie Gowda, DO 08/29/2022

## 2022-08-29 NOTE — Discharge Summary (Signed)
Physician Discharge Summary  Jared Cline KGY:[id number] DOB: 12-Aug-1946  PCP: Bo Merino, FNP  Admitted from: Home Discharged to: CIR  Admit date: 08/24/2022 Discharge date: 08/29/2022  Recommendations for Outpatient Follow-up:    Follow-up Information     MD at CIR Follow up.   Why: Patient will be followed by providers at IR.  Recommend repeating labs (CBC & BMP) in 2 to 3 days.  Recommend arranging outpatient Neurology follow-up at time of discharge from CIR.        Bo Merino, FNP. Schedule an appointment as soon as possible for a visit.   Specialty: Nurse Practitioner Why: Follow-up upon discharge from CIR. Contact information: [address] [place] [address] [phone]         Anabel Bene, MD. Schedule an appointment as soon as possible for a visit.   Specialty: Neurology Contact information: [facility] [place] [address] [phone]                  Home Health: None    Equipment/Devices: TBD at [facility]    Discharge Condition: Improved and stable   Code Status: Full Code Diet recommendation:  Discharge Diet Orders (From admission, onward)     Start     Ordered   08/29/22 0000  Diet - low sodium heart healthy        08/29/22 1247             Discharge Diagnoses:  Principal Problem:   Acute cerebrovascular accident of cerebellum ([place]) Active Problems:   Frequent falls   Abdominal pain   Abnormal CT of the chest   Hypertension   Stage 3a chronic kidney disease ([place])   Renal cyst   OSA on CPAP   PVD (peripheral vascular disease) (HCC)   Coronary artery disease involving native coronary artery of native heart   History of radical prostatectomy   Cerebellar stroke, acute (HCC)   CVA (cerebral vascular accident) ([place])   Brief Summary: 77 y.o. male with medical history significant for CAD, HTN, HLD CKD 3A, OSA on CPAP, history of prostate cancer s/p  prostatectomy, complex renal cysts followed annually by urology, who was brought in by EMS for a fall with inability to get up.  Over the past several days patient has had dizziness and weakness resulting in multiple falls.  MRI brain was consistent with acute punctate infarct in the cerebellar region.  CT of the chest abdomen pelvis did not show any acute pathology but there was concerns for segmental pulmonary embolism.  CTA confirmed segmental pulmonary embolism therefore started on anticoagulation.  PT/OT recommended CIR.     Assessment & Plan:    Acute cerebrovascular accident of cerebellum ([place]) Frequent falls MRI showing Punctate acute infarct in the posteroinferior pons/right cerebellar peduncle.   LDL 86, A1c 5.8.  Prior TR[facility] MD discussed case with Neurology, at this time patient was started on Eliquis given pulmonary embolism and hold off on antiplatelet.  In the future once he has completed anticoagulation treatment, antiplatelet can be started. MRA head and neck showed extensive intracranial atherosclerosis and occluded left vertebral artery.  Neurology recommended continuing anticoagulation for PE. But eventually will need to be on antiplatelets if therapeutic anticoagulation is discontinued.  Outpatient follow-up with Neurology Echocardiogram-70% EF, grade 1 DD PT/OT-CIR.     Right knee pain and swelling Denies history of gout.  Patient and spouse at bedside confirm injury to right knee during one  of his 4 falls PTA.  X-ray of right knee shows effusion but no other acute bony findings.  Does show chronic degenerative findings.  Both verbalized history of osteoarthritis.  Supportive treatment with analgesics and physical therapy.  If symptoms and sign do not improve or worsen then may need further evaluation including CT of the right knee and orthopedic evaluation.  This was discussed with patient and spouse and they verbalized understanding.  As per patient, knee pain has significantly  improved.  Rest of management as above.   Segmental pulmonary embolism, acute Eliquis twice daily.  Duration of anticoagulation?  6 months versus prolonged if this is presumed as without provocation.  This needs to be followed as outpatient with PCP and final determination has to be made.  May consider outpatient Hematology consultation.    History of radical prostatectomy No acute issues   Coronary artery disease involving native coronary artery of native heart Continue  pravastatin.  No angina.   PVD (peripheral vascular disease) (HCC) Continue  pravastatin   OSA on CPAP CPAP nightly   Renal cysts Multiple complicated renal cysts noted on CTA chest with contrast on 08/25/2022 and multiple complicated renal cysts and solid right renal lesion noted by CT chest, abdomen and pelvis with contrast on 08/24/2022: Recommend outpatient follow-up as deemed necessary.  Reportedly follows with urology and if he does not, then this should be arranged through PCP.  5 mm RUL pulmonary nodule: Noted on CTA chest 08/25/2022.  Reported as stable and benign.  Outpatient follow-up as deemed necessary.   Stage 3a chronic kidney disease (HCC) Creatinine went up slightly from 1.2 to 1.42 days ago but has plateaued in the 1.4 range despite IV fluids.  Recommend following BMP as outpatient.  The bump in creatinine may be related to contrast for CTA chest.  Creatinine down to 1.31 on 1/11.  Upon review of prior records, current BMP appears to be at baseline.  Monitor BMP periodically as outpatient.  Patient chronically is on ARB, Aldactone.  HCTZ was discontinued to avoid worsening hyponatremia.   Hypertension Controlled.  Continue prior home dose of clonidine 0.2 mg twice daily, Benicar 20 mg daily and Aldactone 25 Mg daily.  Hydralazine was changed from as needed to 10 Mg 3 times daily.  Controlled.   Body mass index is 30.74 kg/m./Obesity       Consultants:   Neurology   Procedures:      Discharge  Instructions  Discharge Instructions     Ambulatory referral to Neurology   Complete by: As directed    Call MD for:   Complete by: As directed    Strokelike symptoms.   Call MD for:  difficulty breathing, headache or visual disturbances   Complete by: As directed    Call MD for:  extreme fatigue   Complete by: As directed    Call MD for:  persistant dizziness or light-headedness   Complete by: As directed    Call MD for:  redness, tenderness, or signs of infection (pain, swelling, redness, odor or green/yellow discharge around incision site)   Complete by: As directed    Call MD for:  severe uncontrolled pain   Complete by: As directed    Diet - low sodium heart healthy   Complete by: As directed    Increase activity slowly   Complete by: As directed         Medication List     STOP taking these medications    aspirin  EC 81 MG tablet   hydrochlorothiazide 12.5 MG tablet Commonly known as: HYDRODIURIL   losartan 50 MG tablet Commonly known as: COZAAR   naproxen sodium 220 MG tablet Commonly known as: ALEVE   Potassium Gluconate 550 MG Tabs       TAKE these medications    acetaminophen 650 MG CR tablet Commonly known as: TYLENOL Take 2 tablets (1,300 mg total) by mouth every 12 (twelve) hours as needed (Mild pain). What changed: reasons to take this   apixaban 5 MG Tabs tablet Commonly known as: ELIQUIS Take 2 tablets (10 mg total) by mouth 2 (two) times daily for 2 days.   apixaban 5 MG Tabs tablet Commonly known as: ELIQUIS Take 1 tablet (5 mg total) by mouth 2 (two) times daily. Start taking on: September 01, 2022   ascorbic acid 500 MG tablet Commonly known as: VITAMIN C Take 500 mg by mouth daily.   cholecalciferol 25 MCG (1000 UNIT) tablet Commonly known as: VITAMIN D3 Take 1,000 Units by mouth daily.   cloNIDine 0.2 MG tablet Commonly known as: CATAPRES Take 1 tablet (0.2 mg total) by mouth 2 (two) times daily. TAKE 1 TABLET(0.2 MG) BY  MOUTH TWICE DAILY   cyanocobalamin 1000 MCG tablet Commonly known as: VITAMIN B12 Take 1,000 mcg by mouth daily.   hydrALAZINE 10 MG tablet Commonly known as: APRESOLINE Take 1 tablet (10 mg total) by mouth 3 (three) times daily. What changed:  when to take this reasons to take this additional instructions   magnesium oxide 400 MG tablet Commonly known as: MAG-OX Take 400 mg by mouth daily.   meclizine 25 MG tablet Commonly known as: ANTIVERT Take 25 mg by mouth 3 (three) times daily as needed for dizziness.   olmesartan 20 MG tablet Commonly known as: BENICAR Take 20 mg by mouth daily.   ondansetron 4 MG disintegrating tablet Commonly known as: ZOFRAN-ODT Take 4 mg by mouth every 8 (eight) hours as needed for nausea or vomiting.   oxyCODONE 5 MG immediate release tablet Commonly known as: Oxy IR/ROXICODONE Take 1 tablet (5 mg total) by mouth every 8 (eight) hours as needed for moderate pain or severe pain.   polyethylene glycol 17 g packet Commonly known as: MIRALAX / GLYCOLAX Take 17 g by mouth daily. Start taking on: August 30, 2022   pravastatin 80 MG tablet Commonly known as: PRAVACHOL Take 1 tablet (80 mg total) by mouth at bedtime.   senna-docusate 8.6-50 MG tablet Commonly known as: Senokot-S Take 1 tablet by mouth 2 (two) times daily.   spironolactone 25 MG tablet Commonly known as: ALDACTONE TAKE 1 TABLET(25 MG) BY MOUTH DAILY       Allergies  Allergen Reactions   Flomax [Tamsulosin Hcl] Shortness Of Breath   Ace Inhibitors Other (See Comments)    Angioedema   Amlodipine Other (See Comments)    Angioedema   Doxazosin    Lipitor [Atorvastatin] Other (See Comments)    Memory loss      Procedures/Studies: DG Knee 1-2 Views Right  Result Date: 08/27/2022 CLINICAL DATA:  77 year old male with knee pain and swelling. EXAM: RIGHT KNEE - 1-2 VIEW COMPARISON:  No prior knee series. FINDINGS: AP and cross-table lateral views. Bulky  tricompartmental degeneration with severe lateral compartment joint space loss. Near bone on bone appearance there. Bulky degenerative spurring. Small to moderate suprapatellar joint effusion. No acute osseous abnormality identified. Calcified atherosclerosis posterior to the knee. IMPRESSION: Bulky tricompartmental degeneration, most severe in the lateral  compartment. Small to moderate joint effusion. No acute osseous abnormality identified. Electronically Signed   By: Genevie Ann M.D.   On: 08/27/2022 12:25   ECHOCARDIOGRAM COMPLETE  Result Date: 08/26/2022    ECHOCARDIOGRAM REPORT   Patient Name:   Jared Cline Date of Exam: 08/26/2022 Medical Rec #:  [id number]   Height:       72.0 in Accession #:    [id number]  Weight:       226.6 lb Date of Birth:  1946/02/08    BSA:          2.247 m Patient Age:    43 years    BP:           151/77 mmHg Patient Gender: M           HR:           78 bpm. Exam Location:  [facility] Procedure: 2D Echo, Cardiac Doppler and Color Doppler Indications:     Stroke I63.9  History:         Patient has no prior history of Echocardiogram examinations.                  Risk Factors:Hypertension and Sleep Apnea.  Sonographer:     Sherrie Sport Referring Phys:  [id number] Athena Masse Diagnosing Phys: Yolonda Kida MD  Sonographer Comments: Suboptimal apical window. IMPRESSIONS  1. Borderline Focal Inferior Lateral hypo.  2. Left ventricular ejection fraction, by estimation, is 65 to 70%. The left ventricle has normal function. The left ventricle has no regional wall motion abnormalities. There is severe concentric left ventricular hypertrophy. Left ventricular diastolic  parameters are consistent with Grade I diastolic dysfunction (impaired relaxation).  3. Right ventricular systolic function is normal. The right ventricular size is normal.  4. The mitral valve is normal in structure. Trivial mitral valve regurgitation.  5. The aortic valve is normal in structure. Aortic valve regurgitation is not  visualized. FINDINGS  Left Ventricle: Left ventricular ejection fraction, by estimation, is 65 to 70%. The left ventricle has normal function. The left ventricle has no regional wall motion abnormalities. The left ventricular internal cavity size was normal in size. There is  severe concentric left ventricular hypertrophy. Left ventricular diastolic parameters are consistent with Grade I diastolic dysfunction (impaired relaxation). Right Ventricle: The right ventricular size is normal. No increase in right ventricular wall thickness. Right ventricular systolic function is normal. Left Atrium: Left atrial size was normal in size. Right Atrium: Right atrial size was normal in size. Pericardium: There is no evidence of pericardial effusion. Mitral Valve: The mitral valve is normal in structure. Trivial mitral valve regurgitation. Tricuspid Valve: The tricuspid valve is normal in structure. Tricuspid valve regurgitation is trivial. Aortic Valve: The aortic valve is normal in structure. Aortic valve regurgitation is not visualized. Aortic valve mean gradient measures 4.0 mmHg. Aortic valve peak gradient measures 7.5 mmHg. Aortic valve area, by VTI measures 2.75 cm. Pulmonic Valve: The pulmonic valve was normal in structure. Pulmonic valve regurgitation is not visualized. Aorta: The ascending aorta was not well visualized. IAS/Shunts: No atrial level shunt detected by color flow Doppler. Additional Comments: Borderline Focal Inferior Lateral hypo.  LEFT VENTRICLE PLAX 2D LVIDd:         4.20 cm   Diastology LVIDs:         2.70 cm   LV e' medial:    4.24 cm/s LV PW:         1.80 cm  LV E/e' medial:  14.5 LV IVS:        1.90 cm   LV e' lateral:   9.36 cm/s LVOT diam:     2.10 cm   LV E/e' lateral: 6.5 LV SV:         57 LV SV Index:   26 LVOT Area:     3.46 cm  RIGHT VENTRICLE RV S prime:     13.70 cm/s TAPSE (M-mode): 2.1 cm LEFT ATRIUM             Index        RIGHT ATRIUM           Index LA diam:        3.20 cm 1.42  cm/m   RA Area:     16.90 cm LA Vol (A2C):   68.6 ml 30.53 ml/m  RA Volume:   48.40 ml  21.54 ml/m LA Vol (A4C):   25.0 ml 11.13 ml/m LA Biplane Vol: 41.3 ml 18.38 ml/m  AORTIC VALVE AV Area (Vmax):    2.55 cm AV Area (Vmean):   2.55 cm AV Area (VTI):     2.75 cm AV Vmax:           137.00 cm/s AV Vmean:          89.400 cm/s AV VTI:            0.209 m AV Peak Grad:      7.5 mmHg AV Mean Grad:      4.0 mmHg LVOT Vmax:         101.00 cm/s LVOT Vmean:        65.800 cm/s LVOT VTI:          0.166 m LVOT/AV VTI ratio: 0.79  AORTA Ao Root diam: 3.80 cm MITRAL VALVE               TRICUSPID VALVE MV Area (PHT): 2.28 cm    TR Peak grad:   14.3 mmHg MV Decel Time: 333 msec    TR Vmax:        189.00 cm/s MV E velocity: 61.30 cm/s MV A velocity: 91.70 cm/s  SHUNTS MV E/A ratio:  0.67        Systemic VTI:  0.17 m                            Systemic Diam: 2.10 cm Dwayne Prince Rome MD Electronically signed by Yolonda Kida MD Signature Date/Time: 08/26/2022/10:18:58 PM    Final    MR ANGIO HEAD WO CONTRAST  Result Date: 08/25/2022 CLINICAL DATA:  Stroke workup EXAM: MRA HEAD WITHOUT CONTRAST TECHNIQUE: Angiographic images of the Circle of Willis were acquired using MRA technique without intravenous contrast. COMPARISON:  None Available. FINDINGS: Anterior circulation: Intracranial branch atherosclerosis with moderate right and mild left M1 segment stenoses. Fenestrated left distal M1 segment. Asymmetric atheromatous irregularity of the right A1 segment. Negative for aneurysm or vascular malformation. Posterior circulation: No flow seen in most of the left V4 segment and in the left PICA, although there is flow by postcontrast preceding neck MRA. Hypoplastic basilar from fetal type PCA circulation with advanced atheromatous narrowing of the mid basilar. PCA branch high-grade narrowing especially on the left. Negative for aneurysm. IMPRESSION: 1. Extensive intracranial atherosclerosis. 2. Advanced mid basilar and  bilateral PCA branch stenoses. 3. Moderate right M1 segment stenosis. 4. Occluded left vertebral artery in the neck with underestimated  reconstitution of the left V4 segment when compared to postcontrast neck MRA which covers the same area. Electronically Signed   By: Jorje Guild M.D.   On: 08/25/2022 11:03   MR ANGIO NECK W WO CONTRAST  Result Date: 08/25/2022 CLINICAL DATA:  Stroke in the posterior circulation EXAM: MRA NECK WITHOUT AND WITH CONTRAST TECHNIQUE: Multiplanar and multiecho pulse sequences of the neck were obtained without and with intravenous contrast. Angiographic images of the neck were obtained using MRA technique without and with intravenous contrast. CONTRAST:  2m GADAVIST GADOBUTROL 1 MMOL/ML IV SOLN COMPARISON:  Brain MRI from yesterday FINDINGS: Antegrade flow in the carotid and right vertebral circulation by time-of-flight. Partial retropharyngeal course of the bilateral ICA. On postcontrast imaging: Normal arch with 3 vessel branching. Atheromatous plaque at the proximal right ICA with high-grade stenosis. 50% narrowing at the proximal left ICA. No proximal subclavian stenosis. There is mild distal left subclavian narrowing. No detected flow in the upper left V2 and V3 segments with reconstitution at the V4 segment filling the left PICA, retrograde based on MRA of the head. IMPRESSION: 1. Occluded left vertebral artery in the neck with retrograde filling of the left V4 segment and left PICA. 2. Severe atheromatous stenosis at the proximal right ICA. 50% narrowing at the proximal left ICA. Electronically Signed   By: JJorje GuildM.D.   On: 08/25/2022 10:43   CT Angio Chest Pulmonary Embolism (PE) W or WO Contrast  Result Date: 08/25/2022 CLINICAL DATA:  Pulmonary embolism suspected. Positive D-dimer. Status post fall. EXAM: CT ANGIOGRAPHY CHEST WITH CONTRAST TECHNIQUE: Multidetector CT imaging of the chest was performed using the standard protocol during bolus administration  of intravenous contrast. Multiplanar CT image reconstructions and MIPs were obtained to evaluate the vascular anatomy. RADIATION DOSE REDUCTION: This exam was performed according to the departmental dose-optimization program which includes automated exposure control, adjustment of the mA and/or kV according to patient size and/or use of iterative reconstruction technique. CONTRAST:  1044mOMNIPAQUE IOHEXOL 300 MG/ML SOLN, 7541mMNIPAQUE IOHEXOL 350 MG/ML SOLN COMPARISON:  08/24/2022 FINDINGS: Cardiovascular: There is satisfactory opacification of the pulmonary arteries to the segmental level. A filling defect is again identified within the right upper lobe pulmonary artery, image 137/6. Segmental filling defect within a branch of the left lower lobe pulmonary artery is also identified, image 244/6. Heart size is upper limits of normal. Aortic atherosclerosis and coronary artery calcifications. Mediastinum/Nodes: No enlarged mediastinal, hilar, or axillary lymph nodes. Thyroid gland, trachea, and esophagus demonstrate no significant findings. Lungs/Pleura: Pleural thickening noted along the posterior left base. Left lower lobe subsegmental atelectasis. Mild mosaic attenuation. No airspace disease. 5 mm right upper lobe pulmonary nodule is stable from 2019 and is considered benign, image 55/5. Upper Abdomen: No acute abnormality. Multiple complicated renal cysts, reference abdominal MRI with follow-up recommendations 07/09/2021 Musculoskeletal: No chest wall abnormality. No acute or significant osseous findings. Review of the MIP images confirms the above findings. IMPRESSION: 1. Exam is positive for acute pulmonary embolus within segmental branch of the right upper lobe pulmonary artery and segmental branch of the left lower lobe pulmonary artery. 2. Mild mosaic attenuation of the lungs which may reflect small airways disease. 3. Multiple complicated renal cysts, reference abdominal MRI with follow-up recommendations  07/09/2021. 4. Coronary artery calcifications noted. 5. 5 mm right upper lobe pulmonary nodule is stable from 2019 and is considered benign. 6.  Aortic Atherosclerosis (ICD10-I70.0). Electronically Signed   By: TayKerby MoorsD.   On: 08/25/2022  08:57   US Venous Img Lower Bilateral (DVT)  Result Date: 08/24/2022 CLINICAL DATA:  77 year old male with PE EXAM: BILATERAL LOWER EXTREMITY VENOUS DOPPLER ULTRASOUND TECHNIQUE: Gray-scale sonography with graded compression, as well as color Doppler and duplex ultrasound were performed to evaluate the lower extremity deep venous systems from the level of the common femoral vein and including the common femoral, femoral, profunda femoral, popliteal and calf veins including the posterior tibial, peroneal and gastrocnemius veins when visible. The superficial great saphenous vein was also interrogated. Spectral Doppler was utilized to evaluate flow at rest and with distal augmentation maneuvers in the common femoral, femoral and popliteal veins. COMPARISON:  None Available. FINDINGS: RIGHT LOWER EXTREMITY Common Femoral Vein: No evidence of thrombus. Normal compressibility, respiratory phasicity and response to augmentation. Saphenofemoral Junction: No evidence of thrombus. Normal compressibility and flow on color Doppler imaging. Profunda Femoral Vein: No evidence of thrombus. Normal compressibility and flow on color Doppler imaging. Femoral Vein: No evidence of thrombus. Normal compressibility, respiratory phasicity and response to augmentation. Popliteal Vein: No evidence of thrombus. Normal compressibility, respiratory phasicity and response to augmentation. Calf Veins: Posterior tibial vein patent and compressible. Peroneal vein not visualized. Superficial Great Saphenous Vein: No evidence of thrombus. Normal compressibility and flow on color Doppler imaging. Other Findings:  None. LEFT LOWER EXTREMITY Common Femoral Vein: No evidence of thrombus. Normal  compressibility, respiratory phasicity and response to augmentation. Saphenofemoral Junction: No evidence of thrombus. Normal compressibility and flow on color Doppler imaging. Profunda Femoral Vein: No evidence of thrombus. Normal compressibility and flow on color Doppler imaging. Femoral Vein: No evidence of thrombus. Normal compressibility, respiratory phasicity and response to augmentation. Popliteal Vein: No evidence of thrombus. Normal compressibility, respiratory phasicity and response to augmentation. Calf Veins: Posterior tibial vein patent and compressible. Peroneal vein not visualized. Superficial Great Saphenous Vein: No evidence of thrombus. Normal compressibility and flow on color Doppler imaging. Other Findings:  None. IMPRESSION: Duplex of the bilateral lower extremities negative for DVT Signed, Dulcy Fanny. Nadene Rubins, RPVI [facility] Electronically Signed   By: Corrie Mckusick D.O.   On: 08/24/2022 11:02   CT CHEST ABDOMEN PELVIS W CONTRAST  Result Date: 08/24/2022 CLINICAL DATA:  Blunt trauma due to multiple falls over the past few days. Found on floor. Generalized weakness EXAM: CT CHEST, ABDOMEN, AND PELVIS WITH CONTRAST TECHNIQUE: Multidetector CT imaging of the chest, abdomen and pelvis was performed following the standard protocol during bolus administration of intravenous contrast. RADIATION DOSE REDUCTION: This exam was performed according to the departmental dose-optimization program which includes automated exposure control, adjustment of the mA and/or kV according to patient size and/or use of iterative reconstruction technique. CONTRAST:  113m OMNIPAQUE IOHEXOL 300 MG/ML  SOLN COMPARISON:  04/06/2018 chest CT FINDINGS: CT CHEST FINDINGS Cardiovascular: Normal heart size. No pericardial effusion. Atheromatous calcification of the aorta and coronaries. No traumatic arterial finding. Concern for segmental filling defect within  right upper lobe pulmonary artery, seen on source and reformatted images. Direct communication of recommended PE study. Mediastinum/Nodes: No hemothorax, pneumothorax, or pulmonary contusion. Lungs/Pleura: 5 mm pulmonary nodule in the right lung on 4:56 is stable from 2019 and considered benign. Musculoskeletal: Spondylosis with multi-level bridging osteophyte. CT ABDOMEN PELVIS FINDINGS Hepatobiliary: No hepatic injury or perihepatic hematoma. Gallbladder is unremarkable. Pancreas: Negative Spleen: No splenic injury or perisplenic hematoma. Adrenals/Urinary Tract: No adrenal hemorrhage or renal injury identified. Bladder is full. Bilateral renal lesions of various complexity including solid and enhancing  appearing lesion at the lower pole right kidney measuring 14 mm, not significantly changed from prior MRI of the abdomen 07/09/2021 Stomach/Bowel: No visible injury Vascular/Lymphatic: Atherosclerosis with high-grade narrowing at the celiac origin, SMA patency best seen on sagittal reformats. Reproductive: No acute finding Other: No ascites or pneumoperitoneum Musculoskeletal: Generalized lumbar spine degeneration. No evidence of fracture or traumatic malalignment. IMPRESSION: 1. No evidence of injury to the chest or abdomen. 2. Equivocal for segmental pulmonary embolism in the right upper lobe, enough concern to recommend follow-up PE study when renal function permits. 3. Multiple complicated renal cysts and solid right renal lesion, reference abdominal MRI with follow-up recommendations 07/09/2021 4. Atherosclerosis including the coronary arteries. Electronically Signed   By: Jorje Guild M.D.   On: 08/24/2022 04:25   MR BRAIN WO CONTRAST  Result Date: 08/24/2022 CLINICAL DATA:  Hypertension weakness, multiple falls EXAM: MRI HEAD WITHOUT CONTRAST TECHNIQUE: Multiplanar, multiecho pulse sequences of the brain and surrounding structures were obtained without intravenous contrast. COMPARISON:  No prior MRI  available, correlation is made with CT head 08/24/2022 FINDINGS: Brain: Punctate focus of restricted diffusion with likely ADC correlate in the posteroinferior pons/right cerebellar peduncle (series 9, image 12). This area is associated with mildly increased T2 hyperintense signal. No acute hemorrhage, mass, mass effect, or midline shift. No hydrocephalus or extra-axial collection.No hemosiderin deposition to suggest remote hemorrhage.Normal pituitary and craniocervical junction.Scattered T2 hyperintense signal in the periventricular white matter and pons, likely the sequela of mild-to-moderate chronic small vessel ischemic disease. Vascular: Patent arterial flow voids. Skull and upper cervical spine: Normal marrow signal. Sinuses/Orbits: Clear paranasal sinuses.No acute finding in the orbits. Other: The mastoid air cells are well aerated. 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 These results were called by telephone at the time of interpretation on 08/24/2022 at 3:45 am to provider [facility] , who verbally acknowledged these results. Electronically Signed   By: Merilyn Baba M.D.   On: 08/24/2022 03:46   CT HEAD WO CONTRAST (5MM)  Result Date: 08/24/2022 CLINICAL DATA:  Hypertension, weakness, multiple falls EXAM: CT HEAD WITHOUT CONTRAST CT CERVICAL SPINE WITHOUT CONTRAST TECHNIQUE: Multidetector CT imaging of the head and cervical spine was performed following the standard protocol without intravenous contrast. Multiplanar CT image reconstructions of the cervical spine were also generated. RADIATION DOSE REDUCTION: This exam was performed according to the departmental dose-optimization program which includes automated exposure control, adjustment of the mA and/or kV according to patient size and/or use of iterative reconstruction technique. COMPARISON:  None Available. FINDINGS: CT HEAD FINDINGS Brain: No evidence of acute infarct, hemorrhage, mass, mass  effect, or midline shift. No hydrocephalus or extra-axial fluid collection. Cerebral volume is within normal limits for age. Periventricular white matter changes, likely the sequela of chronic small vessel ischemic disease. Vascular: No hyperdense vessel. Skull: Normal. Negative for fracture or focal lesion. Sinuses/Orbits: No acute finding. Other: The mastoid air cells are well aerated. CT CERVICAL SPINE FINDINGS Alignment: Straightening of the normal cervical lordosis. 2 mm anterolisthesis of C7 on T1, which appears facet mediated. No traumatic listhesis. Skull base and vertebrae: No acute fracture. No primary bone lesion or focal pathologic process. Soft tissues and spinal canal: No prevertebral fluid or swelling. No visible canal hematoma. Disc levels: Multilevel degenerative changes, superimposed on congenitally short pedicles, which narrow the AP diameter of the spinal canal. Disc height loss, most prominent C5-C6 and C6-C7. Mild spinal canal stenosis at C3-C4, C5-C6, and C6-C7. Multilevel uncovertebral and facet arthropathy causes  moderate or severe neural foraminal narrowing C3-C7. Upper chest: No focal pulmonary opacity or pleural effusion. Other: Negative. IMPRESSION: 1. No acute intracranial process. 2. No acute fracture or traumatic listhesis in the cervical spine. Electronically Signed   By: Merilyn Baba M.D.   On: 08/24/2022 02:16   CT Cervical Spine Wo Contrast  Result Date: 08/24/2022 CLINICAL DATA:  Hypertension, weakness, multiple falls EXAM: CT HEAD WITHOUT CONTRAST CT CERVICAL SPINE WITHOUT CONTRAST TECHNIQUE: Multidetector CT imaging of the head and cervical spine was performed following the standard protocol without intravenous contrast. Multiplanar CT image reconstructions of the cervical spine were also generated. RADIATION DOSE REDUCTION: This exam was performed according to the departmental dose-optimization program which includes automated exposure control, adjustment of the mA and/or  kV according to patient size and/or use of iterative reconstruction technique. COMPARISON:  None Available. FINDINGS: CT HEAD FINDINGS Brain: No evidence of acute infarct, hemorrhage, mass, mass effect, or midline shift. No hydrocephalus or extra-axial fluid collection. Cerebral volume is within normal limits for age. Periventricular white matter changes, likely the sequela of chronic small vessel ischemic disease. Vascular: No hyperdense vessel. Skull: Normal. Negative for fracture or focal lesion. Sinuses/Orbits: No acute finding. Other: The mastoid air cells are well aerated. CT CERVICAL SPINE FINDINGS Alignment: Straightening of the normal cervical lordosis. 2 mm anterolisthesis of C7 on T1, which appears facet mediated. No traumatic listhesis. Skull base and vertebrae: No acute fracture. No primary bone lesion or focal pathologic process. Soft tissues and spinal canal: No prevertebral fluid or swelling. No visible canal hematoma. Disc levels: Multilevel degenerative changes, superimposed on congenitally short pedicles, which narrow the AP diameter of the spinal canal. Disc height loss, most prominent C5-C6 and C6-C7. Mild spinal canal stenosis at C3-C4, C5-C6, and C6-C7. Multilevel uncovertebral and facet arthropathy causes moderate or severe neural foraminal narrowing C3-C7. Upper chest: No focal pulmonary opacity or pleural effusion. Other: Negative. IMPRESSION: 1. No acute intracranial process. 2. No acute fracture or traumatic listhesis in the cervical spine. Electronically Signed   By: Merilyn Baba M.D.   On: 08/24/2022 02:16      Subjective: Seen along with spouse at bedside. Reports that his right knee pain is significantly better. Swelling may be slightly better. Indicates that he ambulated around the hall yesterday with PT. No other complaints.   Discharge Exam:  Vitals:   08/28/22 1620 08/28/22 1931 08/29/22 0533 08/29/22 1038  BP: (!) 120/42 (!) 139/59 139/65 (!) 164/62  Pulse: 62 68 67  74  Resp: '18 20 19 20  '$ Temp: 97.7 F (36.5 C) 98.9 F (37.2 C) 97.8 F (36.6 C)   TempSrc: Oral Oral    SpO2: 100% 100% 100% 99%  Weight:      Height:        General exam: Elderly male, moderately built and obese lying comfortably propped up in bed without distress.  Appears to be in good spirits. Respiratory system: Clear to auscultation. Respiratory effort normal. Cardiovascular system: S1 & S2 heard, RRR. No JVD, murmurs, rubs, gallops or clicks. No pedal edema.  Off of telemetry. Gastrointestinal system: Abdomen is nondistended, soft and nontender. No organomegaly or masses felt. Normal bowel sounds heard. Central nervous system: Alert and oriented. No focal neurological deficits. Extremities: Symmetric 5 x 5 power.  Right knee with moderate boggy swelling compared to left, minimally warm but no tenderness to touch, slightly painful range of movements, has a cystic swelling on the superior lateral aspect of the right knee.  All  findings improved compared to yesterday with slightly decreased swelling, no localized tenderness or significant pain full range of movements.  Some restricted knee flexion due to swelling. Skin: No rashes, lesions or ulcers Psychiatry: Judgement and insight appear normal. Mood & affect appropriate.    The results of significant diagnostics from this hospitalization (including imaging, microbiology, ancillary and laboratory) are listed below for reference.     Microbiology: Recent Results (from the past 240 hour(s))  Resp panel by RT-PCR (RSV, Flu A&B, Covid) Anterior Nasal Swab     Status: None   Collection Time: 08/24/22  3:47 AM   Specimen: Anterior Nasal Swab  Result Value Ref Range Status   SARS Coronavirus 2 by RT PCR NEGATIVE NEGATIVE Final    Comment: (NOTE) SARS-CoV-2 target nucleic acids are NOT DETECTED.  The SARS-CoV-2 RNA is generally detectable in upper respiratory specimens during the acute phase of infection. The lowest concentration  of SARS-CoV-2 viral copies this assay can detect is 138 copies/mL. A negative result does not preclude SARS-Cov-2 infection and should not be used as the sole basis for treatment or other patient management decisions. A negative result may occur with  improper specimen collection/handling, submission of specimen other than nasopharyngeal swab, presence of viral mutation(s) within the areas targeted by this assay, and inadequate number of viral copies(<138 copies/mL). A negative result must be combined with clinical observations, patient history, and epidemiological information. The expected result is Negative.  Fact Sheet for Patients:  EntrepreneurPulse.com.au  Fact Sheet for Healthcare Providers:  IncredibleEmployment.be  This test is no t yet approved or cleared by the [place] FDA and  has been authorized for detection and/or diagnosis of SARS-CoV-2 by FDA under an Emergency Use Authorization (EUA). This EUA will remain  in effect (meaning this test can be used) for the duration of the COVID-19 declaration under Section 564(b)(1) of the Act, 21 U.S.C.section 360bbb-3(b)(1), unless the authorization is terminated  or revoked sooner.       Influenza A by PCR NEGATIVE NEGATIVE Final   Influenza B by PCR NEGATIVE NEGATIVE Final    Comment: (NOTE) The Xpert Xpress SARS-CoV-2/FLU/RSV plus assay is intended as an aid in the diagnosis of influenza from Nasopharyngeal swab specimens and should not be used as a sole basis for treatment. Nasal washings and aspirates are unacceptable for Xpert Xpress SARS-CoV-2/FLU/RSV testing.  Fact Sheet for Patients: EntrepreneurPulse.com.au  Fact Sheet for Healthcare Providers: IncredibleEmployment.be  This test is not yet approved or cleared by the [place] FDA and has been authorized for detection and/or diagnosis of SARS-CoV-2 by FDA under an Emergency Use  Authorization (EUA). This EUA will remain in effect (meaning this test can be used) for the duration of the COVID-19 declaration under Section 564(b)(1) of the Act, 21 U.S.C. section 360bbb-3(b)(1), unless the authorization is terminated or revoked.     Resp Syncytial Virus by PCR NEGATIVE NEGATIVE Final    Comment: (NOTE) Fact Sheet for Patients: EntrepreneurPulse.com.au  Fact Sheet for Healthcare Providers: IncredibleEmployment.be  This test is not yet approved or cleared by the [place] FDA and has been authorized for detection and/or diagnosis of SARS-CoV-2 by FDA under an Emergency Use Authorization (EUA). This EUA will remain in effect (meaning this test can be used) for the duration of the COVID-19 declaration under Section 564(b)(1) of the Act, 21 U.S.C. section 360bbb-3(b)(1), unless the authorization is terminated or revoked.  Performed at [facility], [address] [place], [place] [address]  Labs: CBC: Recent Labs  Lab 08/23/22 2016 08/24/22 0557 08/25/22 0530 08/26/22 0553 08/27/22 0512  WBC 10.0 8.3 7.1 6.7 6.6  NEUTROABS 6.5  --   --   --   --   HGB 13.7 13.1 13.7 12.8* 12.6*  HCT 42.7 41.8 41.3 39.4 39.0  MCV 86.6 86.9 84.6 85.1 85.0  PLT 342 296 342 319 876    Basic Metabolic Panel: Recent Labs  Lab 08/25/22 0530 08/26/22 0553 08/27/22 0512 08/28/22 0002 08/29/22 0520  NA 134* 133* 131* 133* 131*  K 4.0 4.3 4.0 4.4 4.4  CL 102 101 100 104 102  CO2 '22 22 23 22 '$ 20*  GLUCOSE 95 106* 106* 98 96  BUN 23 19 26* 32* 32*  CREATININE 1.31* 1.20 1.40* 1.40* 1.31*  CALCIUM 9.3 9.2 8.9 8.8* 9.0  MG 2.1 2.2 2.1 2.2  --     Liver Function Tests: Recent Labs  Lab 08/24/22 0347  AST 17  ALT 9  ALKPHOS 50  BILITOT 0.9  PROT 7.2  ALBUMIN 3.9     Urinalysis    Component Value Date/Time   COLORURINE YELLOW (A) 08/24/2022 0441   APPEARANCEUR CLEAR (A) 08/24/2022 0441   APPEARANCEUR  Clear 07/25/2021 1004   LABSPEC 1.018 08/24/2022 0441   LABSPEC 1.017 08/25/2014 1010   PHURINE 5.0 08/24/2022 0441   GLUCOSEU NEGATIVE 08/24/2022 0441   GLUCOSEU Negative 08/25/2014 1010   HGBUR NEGATIVE 08/24/2022 0441   BILIRUBINUR NEGATIVE 08/24/2022 0441   BILIRUBINUR Negative 07/25/2021 1004   BILIRUBINUR Negative 08/25/2014 1010   KETONESUR NEGATIVE 08/24/2022 0441   PROTEINUR NEGATIVE 08/24/2022 0441   UROBILINOGEN 0.2 05/02/2021 1053   NITRITE NEGATIVE 08/24/2022 0441   LEUKOCYTESUR NEGATIVE 08/24/2022 0441   LEUKOCYTESUR Negative 08/25/2014 1010    Discussed in detail with spouse at bedside.  Time coordinating discharge: 40 minutes  SIGNED:  Vernell Leep, MD,  FACP, [place], [facility], [facility], [facility]   [facility] [place]     To contact the attending provider between 7A-7P or the covering provider during after hours 7P-7A, please log into the web site [email or web address] and access using universal [place] password for that web site. If you do not have the password, please call the hospital operator.

## 2022-08-29 NOTE — Progress Notes (Signed)
PROGRESS NOTE   Jared Cline  JAS:[id number]    DOB: [birth year]    DOA: 08/24/2022  PCP: Bo Merino, FNP   I have briefly reviewed patients previous medical records in [facility].  Chief Complaint  Patient presents with   Dizziness   Fall   Weakness    Brief Narrative:  77 y.o. male with medical history significant for CAD, HTN, HLD CKD 3A, OSA on CPAP, history of prostate cancer s/p prostatectomy, complex renal cysts followed annually by urology, who was brought in by EMS for a fall with inability to get up.  Over the past several days patient has had dizziness and weakness resulting in multiple falls.  MRI brain was consistent with acute punctate infarct in the cerebellar region.  CT of the chest abdomen pelvis did not show any acute pathology but there was concerns for segmental pulmonary embolism.  CTA confirmed segmental pulmonary embolism therefore started on anticoagulation.  PT/OT recommended CIR. currently medically optimized for DC to CIR pending insurance authorization and bed.   Assessment & Plan:  Principal Problem:   Acute cerebrovascular accident of cerebellum ([place]) Active Problems:   Frequent falls   Abdominal pain   Abnormal CT of the chest   Hypertension   Stage 3a chronic kidney disease (HCC)   Renal cyst   OSA on CPAP   PVD (peripheral vascular disease) (HCC)   Coronary artery disease involving native coronary artery of native heart   History of radical prostatectomy   Cerebellar stroke, acute (HCC)   CVA (cerebral vascular accident) ([place])   Acute cerebrovascular accident of cerebellum ([place]) Frequent falls MRI showing Punctate acute infarct in the posteroinferior pons/right cerebellar peduncle.   LDL 86, A1c 5.8.  Prior TRH MD discussed case with neurology, at this time patient was started on Eliquis given pulmonary embolism and hold off on antiplatelet.  In the future once he has completed anticoagulation treatment, antiplatelet can be started. MRA  head and neck showed extensive intracranial atherosclerosis and occluded left vertebral artery.  Neurology recommended continuing anticoagulation for PE. But eventually will need to be on antiplatelets if therapeutic anticoagulation is discontinued.  Outpatient follow-up with neurology Echocardiogram-70% EF, grade 1 DD PT/OT-CIR.  Currently medically optimized for DC pending CIR bed and insurance authorization.   Right knee pain and swelling Denies history of gout.  Patient and spouse at bedside confirm injury to right knee during one of his 4 falls PTA.  X-ray of right knee shows effusion but no other acute bony findings.  Does show chronic degenerative findings.  Both verbalized history of osteoarthritis.  Supportive treatment with analgesics and physical therapy.  If symptoms and sign do not improve or worsen then may need further evaluation including CT of the right knee and orthopedic evaluation.  This was discussed with patient and spouse and they verbalized understanding.  As per patient, knee pain has significantly improved.  Rest of management as above.   Segmental pulmonary embolism, acute Eliquis twice daily.  Duration of anticoagulation?  6 months versus prolonged if this is presumed as without provocation.  This needs to be followed as outpatient with PCP and final determination has to be made.  May consider outpatient hematology consultation.   History of radical prostatectomy No acute issues   Coronary artery disease involving native coronary artery of native heart Continue  pravastatin.  No angina.   PVD (peripheral vascular disease) (HCC) Continue  pravastatin   OSA on CPAP CPAP nightly   Renal  cyst Followed by urology   Stage 3a chronic kidney disease ([place]) Creatinine went up slightly from 1.2 to 1.42 days ago but has plateaued in the 1.4 range despite IV fluids.  Recommend following BMP as outpatient.  The bump in creatinine may be related to contrast for CTA chest.   Creatinine down to 1.31 on 1/11.  Follow BMP as outpatient.   Hypertension Controlled.  Body mass index is 30.74 kg/m./Obesity    DVT prophylaxis:   Currently on Eliquis   Code Status: Full Code:  Family Communication: Spouse at bedside today Disposition:  Status is: Inpatient Remains inpatient appropriate because: Medically optimized for DC to CIR pending bed and insurance authorization. TOC on board     Consultants:   Neurology  Procedures:     Antimicrobials:      Subjective:  Seen along with spouse at bedside.  Reports that his right knee pain is significantly better.  Swelling may be slightly better.  Indicates that he ambulated around the hall yesterday with PT.  No other complaints.  Objective:   Vitals:   08/28/22 0738 08/28/22 1620 08/28/22 1931 08/29/22 0533  BP: (!) 138/49 (!) 120/42 (!) 139/59 139/65  Pulse: 68 62 68 67  Resp: '16 18 20 19  '$ Temp: 98.5 F (36.9 C) 97.7 F (36.5 C) 98.9 F (37.2 C) 97.8 F (36.6 C)  TempSrc: Oral Oral Oral   SpO2: 99% 100% 100% 100%  Weight:      Height:        General exam: Elderly male, moderately built and obese lying comfortably propped up in bed without distress.  Appears to be in good spirits. Respiratory system: Clear to auscultation. Respiratory effort normal. Cardiovascular system: S1 & S2 heard, RRR. No JVD, murmurs, rubs, gallops or clicks. No pedal edema.  Off of telemetry. Gastrointestinal system: Abdomen is nondistended, soft and nontender. No organomegaly or masses felt. Normal bowel sounds heard. Central nervous system: Alert and oriented. No focal neurological deficits. Extremities: Symmetric 5 x 5 power.  Right knee with moderate boggy swelling compared to left, minimally warm but no tenderness to touch, slightly painful range of movements, has a cystic swelling on the superior lateral aspect of the right knee.  All findings improved compared to yesterday with slightly decreased swelling, no localized  tenderness or significant pain full range of movements.  Some restricted knee flexion due to swelling. Skin: No rashes, lesions or ulcers Psychiatry: Judgement and insight appear normal. Mood & affect appropriate.     Data Reviewed:   I have personally reviewed following labs and imaging studies   CBC: Recent Labs  Lab 08/23/22 2016 08/24/22 0557 08/25/22 0530 08/26/22 0553 08/27/22 0512  WBC 10.0   < > 7.1 6.7 6.6  NEUTROABS 6.5  --   --   --   --   HGB 13.7   < > 13.7 12.8* 12.6*  HCT 42.7   < > 41.3 39.4 39.0  MCV 86.6   < > 84.6 85.1 85.0  PLT 342   < > 342 319 311   < > = values in this interval not displayed.    Basic Metabolic Panel: Recent Labs  Lab 08/25/22 0530 08/26/22 0553 08/27/22 0512 08/28/22 0002 08/29/22 0520  NA 134* 133* 131* 133* 131*  K 4.0 4.3 4.0 4.4 4.4  CL 102 101 100 104 102  CO2 '22 22 23 22 '$ 20*  GLUCOSE 95 106* 106* 98 96  BUN 23 19 26* 32* 32*  CREATININE 1.31* 1.20 1.40* 1.40* 1.31*  CALCIUM 9.3 9.2 8.9 8.8* 9.0  MG 2.1 2.2 2.1 2.2  --     Liver Function Tests: Recent Labs  Lab 08/24/22 0347  AST 17  ALT 9  ALKPHOS 50  BILITOT 0.9  PROT 7.2  ALBUMIN 3.9    CBG: No results for input(s): "GLUCAP" in the last 168 hours.  Microbiology Studies:   Recent Results (from the past 240 hour(s))  Resp panel by RT-PCR (RSV, Flu A&B, Covid) Anterior Nasal Swab     Status: None   Collection Time: 08/24/22  3:47 AM   Specimen: Anterior Nasal Swab  Result Value Ref Range Status   SARS Coronavirus 2 by RT PCR NEGATIVE NEGATIVE Final    Comment: (NOTE) SARS-CoV-2 target nucleic acids are NOT DETECTED.  The SARS-CoV-2 RNA is generally detectable in upper respiratory specimens during the acute phase of infection. The lowest concentration of SARS-CoV-2 viral copies this assay can detect is 138 copies/mL. A negative result does not preclude SARS-Cov-2 infection and should not be used as the sole basis for treatment or other patient  management decisions. A negative result may occur with  improper specimen collection/handling, submission of specimen other than nasopharyngeal swab, presence of viral mutation(s) within the areas targeted by this assay, and inadequate number of viral copies(<138 copies/mL). A negative result must be combined with clinical observations, patient history, and epidemiological information. The expected result is Negative.  Fact Sheet for Patients:  EntrepreneurPulse.com.au  Fact Sheet for Healthcare Providers:  IncredibleEmployment.be  This test is no t yet approved or cleared by the [place] FDA and  has been authorized for detection and/or diagnosis of SARS-CoV-2 by FDA under an Emergency Use Authorization (EUA). This EUA will remain  in effect (meaning this test can be used) for the duration of the COVID-19 declaration under Section 564(b)(1) of the Act, 21 U.S.C.section 360bbb-3(b)(1), unless the authorization is terminated  or revoked sooner.       Influenza A by PCR NEGATIVE NEGATIVE Final   Influenza B by PCR NEGATIVE NEGATIVE Final    Comment: (NOTE) The Xpert Xpress SARS-CoV-2/FLU/RSV plus assay is intended as an aid in the diagnosis of influenza from Nasopharyngeal swab specimens and should not be used as a sole basis for treatment. Nasal washings and aspirates are unacceptable for Xpert Xpress SARS-CoV-2/FLU/RSV testing.  Fact Sheet for Patients: EntrepreneurPulse.com.au  Fact Sheet for Healthcare Providers: IncredibleEmployment.be  This test is not yet approved or cleared by the [place] FDA and has been authorized for detection and/or diagnosis of SARS-CoV-2 by FDA under an Emergency Use Authorization (EUA). This EUA will remain in effect (meaning this test can be used) for the duration of the COVID-19 declaration under Section 564(b)(1) of the Act, 21 U.S.C. section 360bbb-3(b)(1),  unless the authorization is terminated or revoked.     Resp Syncytial Virus by PCR NEGATIVE NEGATIVE Final    Comment: (NOTE) Fact Sheet for Patients: EntrepreneurPulse.com.au  Fact Sheet for Healthcare Providers: IncredibleEmployment.be  This test is not yet approved or cleared by the [place] FDA and has been authorized for detection and/or diagnosis of SARS-CoV-2 by FDA under an Emergency Use Authorization (EUA). This EUA will remain in effect (meaning this test can be used) for the duration of the COVID-19 declaration under Section 564(b)(1) of the Act, 21 U.S.C. section 360bbb-3(b)(1), unless the authorization is terminated or revoked.  Performed at [facility], [address] [place], [place] [address]  Radiology Studies:  DG Knee 1-2 Views Right  Result Date: 08/27/2022 CLINICAL DATA:  77 year old male with knee pain and swelling. EXAM: RIGHT KNEE - 1-2 VIEW COMPARISON:  No prior knee series. FINDINGS: AP and cross-table lateral views. Bulky tricompartmental degeneration with severe lateral compartment joint space loss. Near bone on bone appearance there. Bulky degenerative spurring. Small to moderate suprapatellar joint effusion. No acute osseous abnormality identified. Calcified atherosclerosis posterior to the knee. IMPRESSION: Bulky tricompartmental degeneration, most severe in the lateral compartment. Small to moderate joint effusion. No acute osseous abnormality identified. Electronically Signed   By: Genevie Ann M.D.   On: 08/27/2022 12:25    Scheduled Meds:    apixaban  10 mg Oral BID   Followed by   Derrill Memo ON 09/01/2022] apixaban  5 mg Oral BID   cloNIDine  0.3 mg Oral BID   hydrALAZINE  10 mg Oral Q8H   irbesartan  150 mg Oral Daily   polyethylene glycol  17 g Oral Daily   pravastatin  80 mg Oral QHS   senna-docusate  1 tablet Oral BID   spironolactone  25 mg Oral Daily    Continuous Infusions:     LOS: 4  days     Vernell Leep, MD,  FACP, FHM, SFHM, [facility], [facility]   [facility] [place]     To contact the attending provider between 7A-7P or the covering provider during after hours 7P-7A, please log into the web site [email or web address] and access using universal [place] password for that web site. If you do not have the password, please call the hospital operator.  08/29/2022, 10:07 AM

## 2022-08-29 NOTE — Progress Notes (Signed)
[facility] Individual Statement of Services  Patient Name:  Jared Cline  Date:  08/29/2022  Welcome to the [facility].  Our goal is to provide you with an individualized program based on your diagnosis and situation, designed to meet your specific needs.  With this comprehensive rehabilitation program, you will be expected to participate in at least 3 hours of rehabilitation therapies Monday-Friday, with modified therapy programming on the weekends.  Your rehabilitation program will include the following services:  Physical Therapy (PT), Occupational Therapy (OT), Speech Therapy (ST), 24 hour per day rehabilitation nursing, Therapeutic Recreaction (TR), Neuropsychology, Care Coordinator, Rehabilitation Medicine, Nutrition Services, Pharmacy Services, and Other  Weekly team conferences will be held on Wednesdays to discuss your progress.  Your Inpatient Rehabilitation Care Coordinator will talk with you frequently to get your input and to update you on team discussions.  Team conferences with you and your family in attendance may also be held.  Expected length of stay:  10-12 Days  Overall anticipated outcome:  Supervision to MOD I  Depending on your progress and recovery, your program may change. Your Inpatient Rehabilitation Care Coordinator will coordinate services and will keep you informed of any changes. Your Inpatient Rehabilitation Care Coordinator's name and contact numbers are listed  below.  The following services may also be recommended but are not provided by the [facility]:   [place] will be made to provide these services after discharge if needed.  Arrangements include referral to agencies that provide these services.  Your insurance has been verified to be:   [employer] Your primary doctor is:  Serafina Royals, FNP  Pertinent  information will be shared with your doctor and your insurance company.  Inpatient Rehabilitation Care Coordinator:  Erlene Quan, [place] or [phone]  Information discussed with and copy given to patient by: Dyanne Iha, 08/29/2022, 3:49 PM

## 2022-08-29 NOTE — Progress Notes (Signed)
Inpatient Rehab Admissions Coordinator:   Left message with [facility] Medicare to check status of CIR prior auth request.    Shann Medal, PT, DPT Admissions Coordinator [phone] 08/29/22  9:57 AM

## 2022-08-29 NOTE — Care Management Important Message (Signed)
Important Message  Patient Details  Name: Jared Cline MRN: [id number] Date of Birth: 10/16/1945   Medicare Important Message Given:  Yes     Juliann Pulse A Taiylor Virden 08/29/2022, 10:16 AM

## 2022-08-29 NOTE — Progress Notes (Addendum)
PMR Admission Coordinator Pre-Admission Assessment   Patient: Jared Cline is an 77 y.o., male MRN: [id number] DOB: 01-Mar-1946 Height: 6' (182.9 cm) Weight: 102.8 kg   Insurance Information HMO:     PPO: yes     PCP:      IPA:      80/20:      OTHER:  PRIMARY: Blue Medicare      Policy#: [id number]       Subscriber: pt CM Name: Marissa Calamity      Phone#: [phone]     Fax#: [phone] Pre-Cert#: [id number] auth for CIR from [facility] with [facility] Medicare with updates due to fax listed above on 1/17.  Employer:  Benefits:  Phone #:      Name:  Eff. Date: 08/19/22     Deduct: $0      Out of Pocket Max: $4900 ($0 met)      Life Max: n/a CIR: $335/day for days 1-5      SNF: 20 full days Outpatient:      Co-Pay: $10/visit Home Health: 100%      Co-Pay:  DME: 80%     Co-Pay: 20% Providers:  SECONDARY:       Policy#:      Phone#:    Development worker, community:       Phone#:    The "Data Collection Information Summary" for patients in Inpatient Rehabilitation Facilities with attached "Privacy Act [place] Records" was provided and verbally reviewed with: Patient   Emergency Contact Information Contact Information       Name Relation Home Work [place] A Spouse [place] Daughter [phone]               Current Medical History  Patient Admitting Diagnosis: CVA   History of Present Illness: Pt is a 77 y/o male with PMH of prostate can s/p prostatectomy, HTN, CAD, CKD III, OSA, and complex renal cysts who presents to [facility] on 08/24/22 with c/o fall at home.  Reports several day hx of dizziness/weakness prior to admit that resulted in multiple falls.  In ED BP 145/66, blood work unremarkable, MRI showed acute cerebellar stroke.  Neurology was consulted and recommended full workup.  MRA head/neck showed extensive intracranial atherosclerosis, advanced midbasilar and bilteral PCA stenosis, right M1 stenosis, and occluded left vertebral artery  in the neck, and well as severe atheromatous stenosis at the proximal right ICA.  Pt found to have segmental PE and started on eliquis; benefit of anticoagulation to outweigh risks.  No indication for antiplatelets in the setting of anticoagulation.  Recommend starting asa if he stops eliquis.  Therapy evaluations completed and pt was recommended for CIR.    Complete NIHSS TOTAL: 0   Patient's medical record from [facility] has been reviewed by the rehabilitation admission coordinator and physician.   Past Medical History      Past Medical History:  Diagnosis Date   BPH (benign prostatic hypertrophy)     Bradycardia      evaluated by Dr. Nehemiah Massed   Cancer [facility])      prostate   Carpal tunnel syndrome, left      left arm   CKD (chronic kidney disease) stage 2, GFR 60-89 ml/min     Diverticulitis     Gastritis 2017   History of prostate cancer     HNP (herniated nucleus pulposus), lumbar     Hyperlipidemia     Hypertension  IBS (irritable bowel syndrome)     Impotence     Multiple lung nodules      nonspecific, largest 11m in Jan 2016; scanned March 2017; rescan 3-6 months later   OA (osteoarthritis) of knee      right   Renal mass      evaluated; followed by urologist   Sleep apnea      on CPAP   Torn meniscus      x 2; right knee      Has the patient had major surgery during 100 days prior to admission? No   Family History   family history includes Alcohol abuse in his brother; Aneurysm in his father; Heart disease in his paternal uncle; Hypertension in his father and mother; Stroke in his mother.   Current Medications   Current Facility-Administered Medications:    acetaminophen (TYLENOL) tablet 650 mg, 650 mg, Oral, Q4H PRN, 650 mg at 08/29/22 1032 **OR** acetaminophen (TYLENOL) 160 MG/5ML solution 650 mg, 650 mg, Per Tube, Q4H PRN **OR** acetaminophen (TYLENOL) suppository 650 mg, 650 mg, Rectal, Q4H PRN, DAthena Masse MD   apixaban (ELIQUIS) tablet 10 mg, 10 mg,  Oral, BID, 10 mg at 08/29/22 1032 **FOLLOWED BY** [START ON 09/01/2022] apixaban (ELIQUIS) tablet 5 mg, 5 mg, Oral, BID, Amin, Ankit Chirag, MD   bisacodyl (DULCOLAX) EC tablet 5 mg, 5 mg, Oral, Daily PRN, Hongalgi, Anand D, MD   cloNIDine (CATAPRES) tablet 0.3 mg, 0.3 mg, Oral, BID, Amin, Ankit Chirag, MD, 0.3 mg at 08/29/22 1032   guaiFENesin (ROBITUSSIN) 100 MG/5ML liquid 5 mL, 5 mL, Oral, Q4H PRN, Amin, Ankit Chirag, MD   hydrALAZINE (APRESOLINE) tablet 10 mg, 10 mg, Oral, Q8H, Amin, Ankit Chirag, MD, 10 mg at 08/29/22 0611   ipratropium-albuterol (DUONEB) 0.5-2.5 (3) MG/3ML nebulizer solution 3 mL, 3 mL, Nebulization, Q4H PRN, Amin, Ankit Chirag, MD   irbesartan (AVAPRO) tablet 150 mg, 150 mg, Oral, Daily, Amin, Ankit Chirag, MD, 150 mg at 08/29/22 1033   ondansetron (ZOFRAN) tablet 4 mg, 4 mg, Oral, Q6H PRN **OR** ondansetron (ZOFRAN) injection 4 mg, 4 mg, Intravenous, Q6H PRN, DJudd GaudierV, MD   oxyCODONE (Oxy IR/ROXICODONE) immediate release tablet 5 mg, 5 mg, Oral, Q4H PRN, Amin, Ankit Chirag, MD, 5 mg at 08/29/22 1032   polyethylene glycol (MIRALAX / GLYCOLAX) packet 17 g, 17 g, Oral, Daily, Hongalgi, Anand D, MD, 17 g at 08/29/22 1033   pravastatin (PRAVACHOL) tablet 80 mg, 80 mg, Oral, QHS, DJudd GaudierV, MD, 80 mg at 08/28/22 2117   senna-docusate (Senokot-S) tablet 1 tablet, 1 tablet, Oral, QHS PRN, Amin, Ankit Chirag, MD   senna-docusate (Senokot-S) tablet 1 tablet, 1 tablet, Oral, BID, Hongalgi, Anand D, MD, 1 tablet at 08/29/22 1032   spironolactone (ALDACTONE) tablet 25 mg, 25 mg, Oral, Daily, Amin, Ankit Chirag, MD, 25 mg at 08/29/22 1032   traZODone (DESYREL) tablet 50 mg, 50 mg, Oral, QHS PRN, Amin, AJeanella Flattery MD   Patients Current Diet:  Diet Order                  Diet Heart Room service appropriate? Yes; Fluid consistency: Thin  Diet effective now                         Precautions / Restrictions Precautions Precautions: Fall Restrictions Weight  Bearing Restrictions: No    Has the patient had 2 or more falls or a fall with injury in the past  year? Yes   Prior Activity Level Limited Community (1-2x/wk): fully independent, no DME used at home, cane in the community, driving   Prior Functional Level Self Care: Did the patient need help bathing, dressing, using the toilet or eating? Independent   Indoor Mobility: Did the patient need assistance with walking from room to room (with or without device)? Independent   Stairs: Did the patient need assistance with internal or external stairs (with or without device)? Independent   Functional Cognition: Did the patient need help planning regular tasks such as shopping or remembering to take medications? Independent   Patient Information Are you of Hispanic, Latino/a,or Spanish origin?: A. No, not of Hispanic, Latino/a, or Spanish origin What is your race?: B. Black or African American Do you need or want an interpreter to communicate with a doctor or health care staff?: 0. No   Patient's Response To:  Health Literacy and Transportation Is the patient able to respond to health literacy and transportation needs?: Yes Health Literacy - How often do you need to have someone help you when you read instructions, pamphlets, or other written material from your doctor or pharmacy?: Never In the past 12 months, has lack of transportation kept you from medical appointments or from getting medications?: No In the past 12 months, has lack of transportation kept you from meetings, work, or from getting things needed for daily living?: No   Home Assistive Devices / [place] Devices/Equipment: Environmental consultant (specify type) Home Equipment: Cane - quad, Cane - single point   Prior Device Use: Indicate devices/aids used by the patient prior to current illness, exacerbation or injury?  Cane in the community   Current Functional Level Cognition   Overall Cognitive Status: Within Functional  Limits for tasks assessed Orientation Level: Oriented X4 General Comments: jovial and supportive family present t/o    Extremity Assessment (includes Sensation/Coordination)   Upper Extremity Assessment: Generalized weakness, RUE deficits/detail, LUE deficits/detail RUE Deficits / Details: mildly impaired FMC/dexterity B hands; strength grossly 4/5 throughout RUE Sensation: WNL RUE Coordination: decreased fine motor LUE Deficits / Details: mildly impaired FMC/dexterity B hands; strength grossly 4/5 throughout LUE Sensation: WNL LUE Coordination: decreased fine motor  Lower Extremity Assessment: Defer to PT evaluation     ADLs   Overall ADL's : Needs assistance/impaired Upper Body Dressing : Minimal assistance, Sitting Lower Body Dressing: Moderate assistance, Sit to/from stand Lower Body Dressing Details (indicate cue type and reason): pants Toileting- Clothing Manipulation and Hygiene: Moderate assistance, Sit to/from stand General ADL Comments: MIN A + RW simualted toilet t/f, ~15 ft x2, assist for balance. MIN A hand washing and washing cup standing sink side, intermittent single UE support, assist for balance.     Mobility   Overal bed mobility: Needs Assistance Bed Mobility: Supine to Sit Supine to sit: Supervision Sit to supine: Min guard, HOB elevated General bed mobility comments: received and left sitting     Transfers   Overall transfer level: Needs assistance Equipment used: Rolling walker (2 wheels) Transfers: Sit to/from Stand Sit to Stand: Min assist Bed to/from chair/wheelchair/BSC transfer type:: Step pivot Step pivot transfers: Min assist General transfer comment: assist to stabilize RW     Ambulation / Gait / Stairs / Emergency planning/management officer   Ambulation/Gait Ambulation/Gait assistance: Min assist, Min guard, Max assist Gait Distance (Feet): 50 Feet Assistive device: Rolling walker (2 wheels) Gait Pattern/deviations: Step-through pattern, Narrow base of  support, Scissoring General Gait Details: Pt was able to ambulate ~ 50  ft with RW prior to requiring seated rest. perform 2 x 50 ft with RW however without AD, Pt has severe balance concerns. HHA +1 attempted however mod assist required to prevent posterior LOB. pt is reliant on RW in standing to maintain standing balance. pt puts forth great effort throughout Gait velocity: decreased     Posture / Balance Dynamic Sitting Balance Sitting balance - Comments: steady sitting reaching within BOS; mild R lean noted Balance Overall balance assessment: Needs assistance Sitting-balance support: No upper extremity supported, Feet supported Sitting balance-Leahy Scale: Good Sitting balance - Comments: steady sitting reaching within BOS; mild R lean noted Postural control: Right lateral lean Standing balance support: No upper extremity supported, During functional activity Standing balance-Leahy Scale: Poor Standing balance comment: pt has severe posterior LOB in standing without BUE support     Special needs/care consideration N/a    Previous Home Environment (from acute therapy documentation) Living Arrangements: Spouse/significant other Available Help at Discharge: Family, Available 24 hours/day Type of Home: House Home Layout: One level Home Access: Stairs to enter Entrance Stairs-Rails: Left, Right Entrance Stairs-Number of Steps: 6 Bathroom Shower/Tub: Chiropodist: Handicapped height Bathroom Accessibility: No Home Care Services: No   Discharge Living Setting Plans for Discharge Living Setting: Patient's home, Lives with (comment) (spouse) Type of Home at Discharge: House Discharge Home Layout: One level Discharge Home Access: Stairs to enter Entrance Stairs-Rails: Right, Left Entrance Stairs-Number of Steps: 6 Discharge Bathroom Shower/Tub: Tub/shower unit Discharge Bathroom Toilet: Handicapped height Discharge Bathroom Accessibility: No Does the patient have  any problems obtaining your medications?: No   Social/Family/Support Systems Patient Roles: Spouse Anticipated Caregiver: Traeton Bordas Anticipated Caregiver's Contact Information: [phone] Ability/Limitations of Caregiver: supervision only Caregiver Availability: 24/7 Discharge Plan Discussed with Primary Caregiver: Yes Is Caregiver In Agreement with Plan?: Yes Does Caregiver/Family have Issues with Lodging/Transportation while Pt is in Rehab?: No   Goals Patient/Family Goal for Rehab: PT/OT/SLP supervision to mod I Expected length of stay: 10-12 days Pt/Family Agrees to Admission and willing to participate: Yes Program Orientation Provided & Reviewed with Pt/Caregiver Including Roles  & Responsibilities: Yes  Barriers to Discharge: Insurance for SNF coverage   Decrease burden of Care through IP rehab admission: na   Possible need for SNF placement upon discharge: not anticipated   Patient Condition: I have reviewed medical records from [facility], spoken with CM, and patient. I discussed via phone for inpatient rehabilitation assessment.  Patient will benefit from ongoing PT, OT, and SLP, can actively participate in 3 hours of therapy a day 5 days of the week, and can make measurable gains during the admission.  Patient will also benefit from the coordinated team approach during an Inpatient Acute Rehabilitation admission.  The patient will receive intensive therapy as well as Rehabilitation physician, nursing, social worker, and care management interventions.  Due to safety, disease management, medication administration, pain management, and patient education the patient requires 24 hour a day rehabilitation nursing.  The patient is currently min assist with mobility and basic ADLs.  Discharge setting and therapy post discharge at home with home health is anticipated.  Patient has agreed to participate in the Acute Inpatient Rehabilitation Program and will admit today.   Preadmission Screen  Completed By:  Michel Santee, PT, DPT 08/29/2022 10:58 AM ______________________________________________________________________   Discussed status with Dr. Tressa Busman on 08/29/22 at 10:58 AM and received approval for admission today.   Admission Coordinator:  Michel Santee, PT, time 10:58 AM Sudie Grumbling 08/29/22  Assessment/Plan: Diagnosis: Does the need for close, 24 hr/day Medical supervision in concert with the patient's rehab needs make it unreasonable for this patient to be served in a less intensive setting? Yes Co-Morbidities requiring supervision/potential complications:  R CVA, new anticoagulation due to segmental PE, CKD stage 3a, R knee pain, PVD, CAD, PVD, HTN Due to safety, disease management, medication administration, pain management, and patient education, does the patient require 24 hr/day rehab nursing? Yes Does the patient require coordinated care of a physician, rehab nurse, PT, OT, and SLP to address physical and functional deficits in the context of the above medical diagnosis(es)? Yes Addressing deficits in the following areas: balance, endurance, locomotion, strength, transferring, bowel/bladder control, bathing, dressing, feeding, grooming, toileting, cognition, swallowing, and psychosocial support Can the patient actively participate in an intensive therapy program of at least 3 hrs of therapy 5 days a week? Yes The potential for patient to make measurable gains while on inpatient rehab is excellent Anticipated functional outcomes upon discharge from inpatient rehab: supervision PT, supervision OT, independent SLP Estimated rehab length of stay to reach the above functional goals is: 10-14 days Anticipated discharge destination: Home 10. Overall Rehab/Functional Prognosis: excellent     MD Signature:   Gertie Gowda, DO 08/29/2022

## 2022-08-29 NOTE — TOC Transition Note (Signed)
Transition of Care [facility]) - CM/SW Discharge Note   Patient Details  Name: Sasuke Yaffe MRN: [id number] Date of Birth: 05-Aug-1946  Transition of Care [facility]) CM/SW Contact:  Gerilyn Pilgrim, LCSW Phone Number: 08/29/2022, 12:12 PM   Clinical Narrative:   Pt has [facility]. Rehab team arranging transport via [place]. CSW sent transport forms to the unit. No additional needs at this time. CSW signing off.     Final next level of care:  (AIR) Barriers to Discharge: Barriers Resolved   Patient Goals and CMS Choice CMS Medicare.gov Compare Post Acute Care list provided to:: Patient Choice offered to / list presented to : Patient  Discharge Placement                [facility]        Discharge Plan and Services Additional resources added to the After Visit Summary for                                       Social Determinants of Health (SDOH) Interventions SDOH Screenings   Food Insecurity: No Food Insecurity (08/24/2022)  Housing: Low Risk  (08/24/2022)  Transportation Needs: No Transportation Needs (08/24/2022)  Utilities: Not At Risk (08/24/2022)  Alcohol Screen: Low Risk  (07/18/2022)  Depression (PHQ2-9): Low Risk  (07/18/2022)  Financial Resource Strain: Low Risk  (07/09/2022)  Physical Activity: Inactive (07/05/2021)  Social Connections: Socially Integrated (07/09/2022)  Stress: No Stress Concern Present (07/09/2022)  Tobacco Use: Medium Risk (08/28/2022)     Readmission Risk Interventions     No data to display

## 2022-08-29 NOTE — Progress Notes (Signed)
Mobility Specialist - Progress Note   08/29/22 0858  Mobility  Activity Ambulated with assistance in hallway  Level of Assistance Contact guard assist, steadying assist  Assistive Device Front wheel walker  Distance Ambulated (ft) 30 ft  Activity Response Tolerated well  Mobility Referral Yes  $Mobility charge 1 Mobility   Pt semi-supine in bed on RA upon arrival. Pt completes bed mobility indep with extra time. STS from a heightened bed and ambulates CGA.Pt has no LOB during session but does endorse dizziness. Pt left in recliner with needs in reach and family present.   Jared Cline  Mobility Specialist  08/29/22 9:00 AM

## 2022-08-29 NOTE — TOC Progression Note (Signed)
Transition of Care [facility]) - Progression Note    Patient Details  Name: Jared Cline MRN: [id number] Date of Birth: Oct 14, 1945  Transition of Care [facility]) CM/SW Contact  Gerilyn Pilgrim, LCSW Phone Number: 08/29/2022, 9:46 AM  Clinical Narrative:   TOC following, auth still pending for AIR.          Expected Discharge Plan and Services                                               Social Determinants of Health (SDOH) Interventions SDOH Screenings   Food Insecurity: No Food Insecurity (08/24/2022)  Housing: Low Risk  (08/24/2022)  Transportation Needs: No Transportation Needs (08/24/2022)  Utilities: Not At Risk (08/24/2022)  Alcohol Screen: Low Risk  (07/18/2022)  Depression (PHQ2-9): Low Risk  (07/18/2022)  Financial Resource Strain: Low Risk  (07/09/2022)  Physical Activity: Inactive (07/05/2021)  Social Connections: Socially Integrated (07/09/2022)  Stress: No Stress Concern Present (07/09/2022)  Tobacco Use: Medium Risk (08/28/2022)    Readmission Risk Interventions     No data to display

## 2022-08-30 LAB — COMPREHENSIVE METABOLIC PANEL
ALT: 8 U/L (ref 0–44)
AST: 12 U/L — ABNORMAL LOW (ref 15–41)
Albumin: 3.4 g/dL — ABNORMAL LOW (ref 3.5–5.0)
Alkaline Phosphatase: 49 U/L (ref 38–126)
Anion gap: 9 (ref 5–15)
BUN: 24 mg/dL — ABNORMAL HIGH (ref 8–23)
CO2: 21 mmol/L — ABNORMAL LOW (ref 22–32)
Calcium: 9.1 mg/dL (ref 8.9–10.3)
Chloride: 100 mmol/L (ref 98–111)
Creatinine, Ser: 1.22 mg/dL (ref 0.61–1.24)
GFR, Estimated: >60 mL/min (ref >60)
Glucose, Bld: 94 mg/dL (ref 70–99)
Potassium: 4.3 mmol/L (ref 3.5–5.1)
Sodium: 130 mmol/L — ABNORMAL LOW (ref 135–145)
Total Bilirubin: 0.7 mg/dL (ref 0.3–1.2)
Total Protein: 6.8 g/dL (ref 6.5–8.1)

## 2022-08-30 LAB — CBC WITH DIFFERENTIAL/PLATELET
Abs Immature Granulocytes: 0.02 10*3/uL (ref 0.00–0.07)
Basophils Absolute: 0.1 10*3/uL (ref 0.0–0.1)
Basophils Relative: 1 %
Eosinophils Absolute: 0.2 10*3/uL (ref 0.0–0.5)
Eosinophils Relative: 4 %
HCT: 37 % — ABNORMAL LOW (ref 39.0–52.0)
Hemoglobin: 12.3 g/dL — ABNORMAL LOW (ref 13.0–17.0)
Immature Granulocytes: 0 %
Lymphocytes Relative: 33 %
Lymphs Abs: 1.7 10*3/uL (ref 0.7–4.0)
MCH: 28.1 pg (ref 26.0–34.0)
MCHC: 33.2 g/dL (ref 30.0–36.0)
MCV: 84.5 fL (ref 80.0–100.0)
Monocytes Absolute: 0.3 10*3/uL (ref 0.1–1.0)
Monocytes Relative: 7 %
Neutro Abs: 2.8 10*3/uL (ref 1.7–7.7)
Neutrophils Relative %: 55 %
Platelets: 336 10*3/uL (ref 150–400)
RBC: 4.38 MIL/uL (ref 4.22–5.81)
RDW: 11.6 % (ref 11.5–15.5)
WBC: 5.1 10*3/uL (ref 4.0–10.5)
nRBC: 0 % (ref 0.0–0.2)

## 2022-08-30 MED ORDER — BUPIVACAINE HCL (PF) 0.5 % IJ SOLN
10.0000 mL | Freq: Once | INTRAMUSCULAR | Status: DC
  Filled 2022-08-30: qty 10

## 2022-08-30 MED ORDER — METHYLPREDNISOLONE ACETATE 40 MG/ML IJ SUSP
40.0000 mg | Freq: Once | INTRAMUSCULAR | Status: DC
  Filled 2022-08-30: qty 1

## 2022-08-30 NOTE — Progress Notes (Signed)
Inpatient Rehabilitation  Patient information reviewed and entered into eRehab system by Miquan Tandon Rain Friedt, OTR/L, Rehab Quality Coordinator.   Information including medical coding, functional ability and quality indicators will be reviewed and updated through discharge.   

## 2022-08-30 NOTE — Plan of Care (Signed)
  Problem: RH Balance Goal: LTG: Patient will maintain dynamic sitting balance (OT) Description: LTG:  Patient will maintain dynamic sitting balance with assistance during activities of daily living (OT) Flowsheets (Taken 08/30/2022 1259) LTG: Pt will maintain dynamic sitting balance during ADLs with: Independent Goal: LTG Patient will maintain dynamic standing with ADLs (OT) Description: LTG:  Patient will maintain dynamic standing balance with assist during activities of daily living (OT)  Flowsheets (Taken 08/30/2022 1259) LTG: Pt will maintain dynamic standing balance during ADLs with: Independent with assistive device   Problem: Sit to Stand Goal: LTG:  Patient will perform sit to stand in prep for activites of daily living with assistance level (OT) Description: LTG:  Patient will perform sit to stand in prep for activites of daily living with assistance level (OT) Flowsheets (Taken 08/30/2022 1259) LTG: PT will perform sit to stand in prep for activites of daily living with assistance level: Independent   Problem: RH Bathing Goal: LTG Patient will bathe all body parts with assist levels (OT) Description: LTG: Patient will bathe all body parts with assist levels (OT) Flowsheets (Taken 08/30/2022 1259) LTG: Pt will perform bathing with assistance level/cueing: Set up assist  LTG: Position pt will perform bathing: Shower   Problem: RH Dressing Goal: LTG Patient will perform upper body dressing (OT) Description: LTG Patient will perform upper body dressing with assist, with/without cues (OT). Flowsheets (Taken 08/30/2022 1300) LTG: Pt will perform upper body dressing with assistance level of: Independent   Problem: RH Toilet Transfers Goal: LTG Patient will perform toilet transfers w/assist (OT) Description: LTG: Patient will perform toilet transfers with assist, with/without cues using equipment (OT) Flowsheets (Taken 08/30/2022 1300) LTG: Pt will perform toilet transfers with  assistance level of: Independent with assistive device   Problem: RH Tub/Shower Transfers Goal: LTG Patient will perform tub/shower transfers w/assist (OT) Description: LTG: Patient will perform tub/shower transfers with assist, with/without cues using equipment (OT) Flowsheets (Taken 08/30/2022 1300) LTG: Pt will perform tub/shower stall transfers with assistance level of: Set up assist   Problem: RH Dressing Goal: LTG Patient will perform lower body dressing w/assist (OT) Description: LTG: Patient will perform lower body dressing with assist, with/without cues in positioning using equipment (OT) Flowsheets (Taken 08/30/2022 1301) LTG: Pt will perform lower body dressing with assistance level of: Independent with assistive device

## 2022-08-30 NOTE — Progress Notes (Signed)
Patient ID: Jared Cline, male   DOB: Jul 06, 1946, 77 y.o.   MRN: [id number]  Consulted for arthrocentesis but unfortunately pt not ready by EOB. Please reconsult Monday if still desire procedure.    Lisette Abu, PA-C Orthopedic Surgery [phone]

## 2022-08-30 NOTE — Evaluation (Signed)
Occupational Therapy Assessment and Plan  Patient Details  Name: Jared Cline MRN: [id number] Date of Birth: 07/30/1946  OT Diagnosis: acute pain (R knee), disturbance of vision (visual fixation), and balance deficits due to dizziness Rehab Potential: Rehab Potential (ACUTE ONLY): Excellent ELOS: 10-12 days   Today's Date: 08/30/2022 OT Individual Time: 6761-9509 OT Individual Time Calculation (min): 81 min     Hospital Problem: Principal Problem:   Cerebellar stroke ([place])   Past Medical History:  Past Medical History:  Diagnosis Date   BPH (benign prostatic hypertrophy)    Bradycardia    evaluated by Dr. Nehemiah Massed   Cancer [facility])    prostate   Carpal tunnel syndrome, left    left arm   CKD (chronic kidney disease) stage 2, GFR 60-89 ml/min    Diverticulitis    Gastritis 2017   History of prostate cancer    HNP (herniated nucleus pulposus), lumbar    Hyperlipidemia    Hypertension    IBS (irritable bowel syndrome)    Impotence    Multiple lung nodules    nonspecific, largest 30m in Jan 2016; scanned March 2017; rescan 3-6 months later   OA (osteoarthritis) of knee    right   Renal mass    evaluated; followed by urologist   Sleep apnea    on CPAP   Torn meniscus    x 2; right knee   Past Surgical History:  Past Surgical History:  Procedure Laterality Date   abdominal ultrasound  Oct 2015   Negative for eval for AAA   COLONOSCOPY     KNEE ARTHROSCOPY Right    x 2   LUMBAR LAMINECTOMY     PROSTATECTOMY     robotic    Assessment & Plan Clinical Impression: Jared Cline a 77year old male with history of BPH, CKD, severe OA B-Knees, CKD 3b,  PVD, HTN w/recent ED visit for dizziness and presyncope [facility] with reports of falls X 2, dizziness, weakness and inability to stand. He was found to have  segmental PE in RUL and LLE. MRI brain revealed acute punctate infarct in posteroinferior pons/right cerebellar peduncle. MRA revealed  extensive intracranial atherosclerosis with advance mid basilar and B-PCA branch stenosis, occluded L-VA and moderate R-M1 segment stenosis as well as severe atheromatous stenosis proximal R-ICA and 50% narrowing proximal L-ICA. BLE dopplers were negative for DVT. He was started on treatment dose Lovenox and    2 D echo showed EF 65-70% with no wall abnormality, severe concentric LVH with G1DD. Dr. SQuinn Axerecommended addition of ASA once DOAC discontinued and to follow up with neurology after discharge.  He has been limited by right knee pain and swelling w/xrays revealing bulky tricompartmental degeneration with small to moderate suprapatellar joint effusion. Oxycodone added for pain control. PT/OT has been working with patient who continues to be limited by balance deficits, right knee instability with pain,dizziness due to vestibular issues as well as right ataxia. CIR recommended due to functional decline.   Patient transferred to CIR on 08/29/2022 .    Patient currently requires min with basic self-care skills secondary to decreased coordination for R knee pain, decreased visual motor skills of saccades and visual fixation, and decreased sitting balance, decreased standing balance,  and decreased balance strategies.  Prior to hospitalization, patient was fully independent and active.   Patient will benefit from skilled intervention to increase independence with basic self-care skills prior to discharge home with care partner.  Anticipate patient will require intermittent supervision and follow up home health.  OT - End of Session Activity Tolerance: Tolerates 10 - 20 min activity with multiple rests Endurance Deficit: Yes OT Assessment Rehab Potential (ACUTE ONLY): Excellent OT Patient demonstrates impairments in the following area(s): Balance;Edema;Endurance;Motor OT Basic ADL's Functional Problem(s): Bathing;Dressing;Toileting OT Transfers Functional Problem(s): Tub/Shower;Toilet OT Additional  Impairment(s): None OT Plan OT Intensity: Minimum of 1-2 x/day, 45 to 90 minutes OT Frequency: 5 out of 7 days OT Duration/Estimated Length of Stay: 10-12 days OT Treatment/Interventions: Teacher, English as a foreign language;Discharge planning;Functional mobility training;Neuromuscular re-education;Psychosocial support;Patient/family education;Pain management;Self Care/advanced ADL retraining;UE/LE Strength taining/ROM;Therapeutic Exercise;Therapeutic Activities;Visual/perceptual remediation/compensation OT Self Feeding Anticipated Outcome(s): no goal, pt is independent OT Basic Self-Care Anticipated Outcome(s): Mod I OT Toileting Anticipated Outcome(s): Mod I OT Bathroom Transfers Anticipated Outcome(s): Mod I OT Recommendation Patient destination: Home Follow Up Recommendations: Home health OT Equipment Recommended: To be determined Equipment Details: pt may need a BSC and a tub bench, will need to discuss with spouse and pt more.   OT Evaluation Precautions/Restrictions  Precautions Precautions: Fall Restrictions Weight Bearing Restrictions: No General   Vital Signs   Pain   Home Living/Prior Functioning Home Living Family/patient expects to be discharged to:: Private residence Living Arrangements: Spouse/significant other Available Help at Discharge: Family, Available 24 hours/day Type of Home: House Home Access: Stairs to enter [employer] of Steps: 6 Entrance Stairs-Rails: Left, Right Home Layout: One level Bathroom Shower/Tub: Tub/shower unit  Lives With: Spouse Prior Function Level of Independence: Independent with basic ADLs, Independent with gait, Independent with transfers  Able to Take Stairs?: Yes Driving: Yes 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 [place] Vision/History: 1 Wears glasses Ability to See in Adequate Light: 0 Adequate Patient Visual Report: No change from  baseline Vision Assessment?: Yes Eye Alignment: Within Functional Limits Ocular Range of Motion: Within Functional Limits Alignment/Gaze Preference: Within Defined Limits Tracking/Visual Pursuits: Able to track stimulus in all quads without difficulty Saccades: Additional eye shifts occurred during testing Convergence: Within functional limits Visual Fields: No apparent deficits Additional Comments: pt has significant difficulty maintaing visual fixation with head movement Perception  Perception: Within Functional Limits Praxis Praxis: Intact Cognition Cognition Overall Cognitive Status: Within Functional Limits for tasks assessed Arousal/Alertness: Awake/alert Orientation Level: Person;Place;Situation Person: Oriented Place: Oriented Situation: Oriented Memory: Appears intact Awareness: Appears intact Problem Solving: Appears intact Safety/Judgment: Appears intact Brief Interview for Mental Status (BIMS) Repetition of Three Words (First Attempt): 3 Temporal Orientation: Year: Correct Temporal Orientation: Month: Accurate within 5 days Temporal Orientation: Day: Correct Recall: "Sock": Yes, no cue required Recall: "Blue": Yes, no cue required Recall: "Bed": Yes, no cue required BIMS Summary Score: 15 Sensation Sensation Light Touch: Appears Intact Hot/Cold: Appears Intact Proprioception: Appears Intact Stereognosis: Appears Intact Coordination Gross Motor Movements are Fluid and Coordinated: Yes Fine Motor Movements are Fluid and Coordinated: No (limited by B knee pain, R knee with edema) Motor  Motor Motor: Within Functional Limits  Trunk/Postural Assessment  Cervical Assessment Cervical Assessment: Within Functional Limits Thoracic Assessment Thoracic Assessment: Within Functional Limits Lumbar Assessment Lumbar Assessment: Within Functional Limits Postural Control Postural Control: Within Functional Limits  Balance Static Sitting Balance Static Sitting -  Level of Assistance: 7: Independent Dynamic Sitting Balance Dynamic Sitting - Level of Assistance: 5: Stand by assistance Sitting balance - Comments: mild R lean Static Standing Balance Static Standing - Level of Assistance: 5: Stand by assistance (with UE support on RW) Dynamic Standing Balance Dynamic Standing -  Level of Assistance: 4: Min assist;3: Mod assist (fluctuates with knee pain levels, dizziness levels) Extremity/Trunk Assessment RUE Assessment RUE Assessment: Within Functional Limits General Strength Comments: 100 lbs of grip strength LUE Assessment LUE Assessment: Within Functional Limits General Strength Comments: 95 lbs of grip strength  Care Tool Care Tool Self Care Eating   Eating Assist Level: Independent    Oral Care    Oral Care Assist Level: Independent    Bathing   Body parts bathed by patient: Right arm;Left arm;Chest;Abdomen;Front perineal area;Buttocks;Right upper leg;Left upper leg;Face;Left lower leg;Right lower leg     Assist Level: Contact Guard/Touching assist    Upper Body Dressing(including orthotics)   What is the patient wearing?: Pull over shirt   Assist Level: Set up assist    Lower Body Dressing (excluding footwear)   What is the patient wearing?: Underwear/pull up;Pants Assist for lower body dressing: Contact Guard/Touching assist    Putting on/Taking off footwear   What is the patient wearing?: Non-skid slipper socks Assist for footwear: Minimal Assistance - Patient > 75%       Care Tool Toileting Toileting activity   Assist for toileting: Contact Guard/Touching assist     Care Tool Bed Mobility Roll left and right activity   Roll left and right assist level: Independent    Sit to lying activity   Sit to lying assist level: Supervision/Verbal cueing    Lying to sitting on side of bed activity   Lying to sitting on side of bed assist level: the ability to move from lying on the back to sitting on the side of the bed with  no back support.: Supervision/Verbal cueing     Care Tool Transfers Sit to stand transfer   Sit to stand assist level: Contact Guard/Touching assist    Chair/bed transfer   Chair/bed transfer assist level: Contact Guard/Touching assist     Toilet transfer   Assist Level: Contact Guard/Touching assist     Care Tool Cognition  Expression of Ideas and Wants Expression of Ideas and Wants: 4. Without difficulty (complex and basic) - expresses complex messages without difficulty and with speech that is clear and easy to understand  Understanding Verbal and Non-Verbal Content Understanding Verbal and Non-Verbal Content: 4. Understands (complex and basic) - clear comprehension without cues or repetitions   Memory/Recall Ability Memory/Recall Ability : Current season;Location of own room;That he or she is in a hospital/hospital unit   Refer to Care Plan for [place] 1 OT Short Term Goal 1 (Week 1): Pt will be able to ambulate to bathroom with S using LRAD. OT Short Term Goal 2 (Week 1): Pt will complete LB dressing with supervision. OT Short Term Goal 3 (Week 1): Pt will complete shower with set up A/S. OT Short Term Goal 4 (Week 1): Pt will demonstrate independence with completing visual fixation exercises.  Recommendations for other services: None    Skilled Therapeutic Intervention ADL ADL Eating: Independent Grooming: Independent Upper Body Bathing: Setup Lower Body Bathing: Contact guard Upper Body Dressing: Setup Lower Body Dressing: Contact guard Toileting: Contact guard Toilet Transfer: Contact guard Toilet Transfer Method: Counselling psychologist: Grab bars;Raised toilet seat (RW) Mobility  Transfers Sit to Stand: Contact Guard/Touching assist Stand to Sit: Contact Guard/Touching assist  Pt seen for initial evaluation and ADL training with a focus on visual fixation to prevent onset of dizziness and balance. Pt did extremely  well following directions for safe sit to stand  techniques. He initially tried to push straight up and then could not achieve balance. Spent time working on foot position and forward weight shift.  He was then able to rise to stand quite easily. Raised bed slightly so pt would have less pressure on knees.  Pt used RW to step to wc, rested and then into bathroom.  Sitting on elevated toilet seat, pt demonstrated how he can reach all body parts (pt did not need to bathe this morning as his wife assisted him last night) and pt doffed old clothing and new clothing with CGA for LB.  Pt ambulated back to w/c. Pt did not have any dizziness during the session.   Discussed role of OT, POC, pt's goals, ELOS.  Pt resting in w/c with all needs met. Alarm set and call light in reach.   Discharge Criteria: Patient will be discharged from OT if patient refuses treatment 3 consecutive times without medical reason, if treatment goals not met, if there is a change in medical status, if patient makes no progress towards goals or if patient is discharged from hospital.  The above assessment, treatment plan, treatment alternatives and goals were discussed and mutually agreed upon: by patient  [facility] 08/30/2022, 12:41 PM

## 2022-08-30 NOTE — Progress Notes (Signed)
PROGRESS NOTE   Subjective/Complaints:  Discussed rehab process.  Patient is looking forward to therapy this morning. No complaints today Review of systems negative for chest pain shortness of breath nausea vomiting diarrhea or constipation.  Review of systems negative for chest pain or shortness of breath  Objective:   No results found. Recent Labs    08/30/22 0556  WBC 5.1  HGB 12.3*  HCT 37.0*  PLT 336   Recent Labs    08/29/22 0520 08/30/22 0556  NA 131* 130*  K 4.4 4.3  CL 102 100  CO2 20* 21*  GLUCOSE 96 94  BUN 32* 24*  CREATININE 1.31* 1.22  CALCIUM 9.0 9.1    Intake/Output Summary (Last 24 hours) at 08/30/2022 0738 Last data filed at 08/30/2022 0222 Gross per 24 hour  Intake 360 ml  Output 350 ml  Net 10 ml        Physical Exam: Vital Signs Blood pressure 121/63, pulse 72, temperature 97.8 F (36.6 C), resp. rate 16, height 6' (1.829 m), weight 98.6 kg, SpO2 100 %.   General: No acute distress Mood and affect are appropriate Heart: Regular rate and rhythm no rubs murmurs or extra sounds Lungs: Clear to auscultation, breathing unlabored, no rales or wheezes Abdomen: Positive bowel sounds, soft nontender to palpation, nondistended Extremities: No clubbing, cyanosis, or edema Skin: No evidence of breakdown, no evidence of rash Neurologic: Cranial nerves II through XII intact, motor strength is 5/5 in bilateral deltoid, bicep, tricep, grip, hip flexor, knee extensors, ankle dorsiflexor and plantar flexor Sensory exam normal sensation to light touch and proprioception in bilateral upper and lower extremities Cerebellar exam normal finger to nose to finger as well as heel to shin in bilateral upper and lower extremities Musculoskeletal: Full range of motion in all 4 extremities. No joint swelling   Assessment/Plan: 1. Functional deficits which require 3+ hours per day of interdisciplinary  therapy in a comprehensive inpatient rehab setting. Physiatrist is providing close team supervision and 24 hour management of active medical problems listed below. Physiatrist and rehab team continue to assess barriers to discharge/monitor patient progress toward functional and medical goals  Care Tool:  Bathing              Bathing assist       Upper Body Dressing/Undressing Upper body dressing        Upper body assist      Lower Body Dressing/Undressing Lower body dressing            Lower body assist       Toileting Toileting    Toileting assist       Transfers Chair/bed transfer  Transfers assist           Locomotion Ambulation   Ambulation assist              Walk 10 feet activity   Assist           Walk 50 feet activity   Assist           Walk 150 feet activity   Assist           Walk 10 feet on uneven  surface  activity   Assist           Wheelchair     Assist               Wheelchair 50 feet with 2 turns activity    Assist            Wheelchair 150 feet activity     Assist          Blood pressure 121/63, pulse 72, temperature 97.8 F (36.6 C), resp. rate 16, height 6' (1.829 m), weight 98.6 kg, SpO2 100 %.  Medical Problem List and Plan: 1. Functional deficits secondary to R ponto-cerebellar CVA             -patient may  shower             -ELOS/Goals: 10-14 days, supervision PT/OT, Ind SLP             - Primary barriers: balance and coordination deficits, R knee pain, 5 STE home 2. New diagnosis segmental PE/Antithrombotics: -DVT/anticoagulation:  Pharmaceutical: Eliquis- at least 43mo            -antiplatelet therapy: N/A (ASA after DOAC completed)  3. Pain Management: oxycodone prn for knee pain.              - Pre-medicate AM therapies to allow participation             - See management of R knee effusion below 4. Mood/Behavior/Sleep: LCSW to follow for  evaluation and support.              --Trazodone prn for sleep.              -antipsychotic agents: N/A 5. Neuropsych/cognition: This patient is capable of making decisions on his own behalf. 6. Skin/Wound Care: Routine pressure relief measures.  7. Fluids/Electrolytes/Nutrition: Monitor I/O. Check CMET in am.  8. HTN: Monitor BP TID- continue     08/29/2022    9:53 PM 08/29/2022    8:39 PM 08/29/2022    4:27 PM  Vitals with BMI  Systolic [id number] Diastolic 70 72 70  Pulse   65 59    9. CKD 3A: Baseline SCr 1.3-1.4. Reports mild dysuria since admission. Encourage fluid intake.  BUN/Cr improved 1/12 vs 1/11 10. OSA: Continue CPAP 11. Prostate cancer s/p prostatectomy: 12. Right knee OA/effusion: Used to get his knee injected every 6 months and missed an appt last week. --Add voltaren gel QID with aquatherapy prn.  --Oxycodone prn pain--may need premedication.  --consult ortho for aspiration. Pending results, would consider steroid injection (was due for OP bilateral injections prior to admission) 13. Hyponatremia: Borderline low. Continue to monitor sodium levels. Stable valus 1/12 vs 1/11 14. Acute blood loss anemia: Recheck in am 15. Constipation: Has not had BM in 7 days but passing gas and no pain or nausea. --Will try sorbitol tonight.       LOS: 1 days A FACE TO FACE EVALUATION WAS PERFORMED  ACharlett Blake1/07/2023, 7:38 AM

## 2022-08-30 NOTE — Plan of Care (Signed)
  Problem: RH Balance Goal: LTG Patient will maintain dynamic standing balance (PT) Description: LTG:  Patient will maintain dynamic standing balance with assistance during mobility activities (PT) Flowsheets (Taken 08/30/2022 1425) LTG: Pt will maintain dynamic standing balance during mobility activities with:: Independent with assistive device    Problem: Sit to Stand Goal: LTG:  Patient will perform sit to stand with assistance level (PT) Description: LTG:  Patient will perform sit to stand with assistance level (PT) Flowsheets (Taken 08/30/2022 1425) LTG: PT will perform sit to stand in preparation for functional mobility with assistance level: Independent with assistive device   Problem: RH Bed Mobility Goal: LTG Patient will perform bed mobility with assist (PT) Description: LTG: Patient will perform bed mobility with assistance, with/without cues (PT). Flowsheets (Taken 08/30/2022 1425) LTG: Pt will perform bed mobility with assistance level of: Independent   Problem: RH Car Transfers Goal: LTG Patient will perform car transfers with assist (PT) Description: LTG: Patient will perform car transfers with assistance (PT). Flowsheets (Taken 08/30/2022 1425) LTG: Pt will perform car transfers with assist:: Contact Guard/Touching assist   Problem: RH Ambulation Goal: LTG Patient will ambulate in controlled environment (PT) Description: LTG: Patient will ambulate in a controlled environment, # of feet with assistance (PT). Flowsheets (Taken 08/30/2022 1425) LTG: Pt will ambulate in controlled environ  assist needed:: Independent with assistive device LTG: Ambulation distance in controlled environment: 150' Goal: LTG Patient will ambulate in home environment (PT) Description: LTG: Patient will ambulate in home environment, # of feet with assistance (PT). Flowsheets (Taken 08/30/2022 1425) LTG: Pt will ambulate in home environ  assist needed:: Independent with assistive device LTG:  Ambulation distance in home environment: 50'   Problem: RH Stairs Goal: LTG Patient will ambulate up and down stairs w/assist (PT) Description: LTG: Patient will ambulate up and down # of stairs with assistance (PT) Flowsheets (Taken 08/30/2022 1425) LTG: Pt will ambulate up/down stairs assist needed:: Supervision/Verbal cueing LTG: Pt will  ambulate up and down number of stairs: 5 with 2 hand rails, per home setup

## 2022-08-30 NOTE — Progress Notes (Signed)
Inpatient Rehabilitation Admission Medication Review by a Pharmacist   A complete drug regimen review was completed for this patient to identify any potential clinically significant medication issues.   High Risk Drug Classes Is patient taking? Indication by Medication  Antipsychotic Yes Compazine (PO/PR/IM) - nausea  Anticoagulant Yes Eliquis - PE  Antibiotic No    Opioid Yes Oxycodone - pain  Antiplatelet No    Hypoglycemics/insulin No    Vasoactive Medication Yes Clonidine, irbesartan (formulary equiv. For olmesartan), spironolactone, hydralazine - HTN  Chemotherapy No    Other Yes Trazodone - sleep Pravastatin - HLD Voltaren gel - pain Robitussin DM - cough Duonebs - shortness of breath/wheezing        Type of Medication Issue Identified Description of Issue Recommendation(s)  Drug Interaction(s) (clinically significant)        Duplicate Therapy        Allergy        No Medication Administration End Date        Incorrect Dose        Additional Drug Therapy Needed        Significant med changes from prior encounter (inform family/care partners about these prior to discharge).      Other   PTA meds not yet resumed: HCTZ Restart PTA meds when and if necessary during CIR admission or at time of discharge, if warranted      Clinically significant medication issues were identified that warrant physician communication and completion of prescribed/recommended actions by midnight of the next day:  No   Name of provider notified for urgent issues identified:    Provider Method of Notification:    Pharmacist comments:    Time spent performing this drug regimen review (minutes):  [place], PharmD, BCPS 08/29/2022 2:47 PM

## 2022-08-30 NOTE — Progress Notes (Signed)
Inpatient Rehabilitation Care Coordinator Assessment and Plan Patient Details  Name: Jared Cline MRN: [id number] Date of Birth: 04/30/1946  Today's Date: 08/30/2022  Hospital Problems: Principal Problem:   Cerebellar stroke [facility])  Past Medical History:  Past Medical History:  Diagnosis Date   BPH (benign prostatic hypertrophy)    Bradycardia    evaluated by Dr. Nehemiah Massed   Cancer [facility])    prostate   Carpal tunnel syndrome, left    left arm   CKD (chronic kidney disease) stage 2, GFR 60-89 ml/min    Diverticulitis    Gastritis 2017   History of prostate cancer    HNP (herniated nucleus pulposus), lumbar    Hyperlipidemia    Hypertension    IBS (irritable bowel syndrome)    Impotence    Multiple lung nodules    nonspecific, largest 14m in Jan 2016; scanned March 2017; rescan 3-6 months later   OA (osteoarthritis) of knee    right   Renal mass    evaluated; followed by urologist   Sleep apnea    on CPAP   Torn meniscus    x 2; right knee   Past Surgical History:  Past Surgical History:  Procedure Laterality Date   abdominal ultrasound  Oct 2015   Negative for eval for AAA   COLONOSCOPY     KNEE ARTHROSCOPY Right    x 2   LUMBAR LAMINECTOMY     PROSTATECTOMY     robotic   Social History:  reports that he quit smoking about 51 years ago. His smoking use included cigarettes. He has a 4.50 pack-year smoking history. He has been exposed to tobacco smoke. He has never used smokeless tobacco. He reports that he does not drink alcohol and does not use drugs.  Family / Support Systems Marital Status: Married How Long?: 55years Patient Roles: Spouse, Parent Spouse/Significant Other: [name](wife) [phone]: 3 children- [name] (dtr), CGerald Stabs(son), and [name](dtr). reports all children will be assisting with care needs. Other Supports: None reported Anticipated Caregiver: Wife and dtr [name] who checks in daily. Ability/Limitations of Caregiver: Wife is not  able to provide any physical assistance. Pt wife can provide supervision only. 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 Recent Psychosocial Issues: Denies Psychiatric History: Denies Substance Abuse History: Reports he quit smoking cigarettes 50+ yrs ago due to children. Etoh- none in the last 50+ yrs. No rec drug use.  Patient / Family Perceptions, Expectations & Goals Pt/Family understanding of illness & functional limitations: Pt and family have  a general understanding of care needs Premorbid pt/family roles/activities: Independent Anticipated changes in roles/activities/participation: Assistance with ADLs/IADLs Pt/family expectations/goals: Pt foal is to work on "equilibrium.Marland Kitchenkeeps throwing me off when I stand up; balance." Reports he was using a cane for his  knee due to arthritis."  [employer]: None Premorbid Home Care/DME Agencies: None Transportation available at discharge: TBD Is the patient able to respond to transportation needs?: Yes In the past 12 months, has lack of transportation kept you from medical appointments or from getting medications?: No In the past 12 months, has lack of transportation kept you from meetings, work, or from getting things needed for daily living?: No Resource referrals recommended: Neuropsychology  Discharge Planning Living Arrangements: Spouse/significant other Support Systems: Spouse/significant other, Children Type of Residence: Private residence Insurance Resources: Multimedia programmer (specify) ([place]) Financial Resources: Radio broadcast assistant Screen Referred: No Living Expenses: Rent Money Management: Patient Does the patient have any problems obtaining your medications?: No Home Management: Pt reports his wife is managing all home care needs. Patient/Family Preliminary Plans: No changes Care Coordinator Anticipated Follow Up Needs: HH/OP Expected length of stay: 10-12 days  Clinical Impression This SW assisting with primary SW.  Pt is not a veteran. No HCPOA. DME-shower chair, rolling walker since he had his stroke.   [name] A [name] 08/30/2022, 2:51 PM

## 2022-08-30 NOTE — Evaluation (Signed)
Physical Therapy Assessment and Plan  Patient Details  Name: Jared Cline MRN: [id number] Date of Birth: 1946/05/12  PT Diagnosis: Abnormality of gait, Difficulty walking, Muscle weakness, and Pain in knees Rehab Potential: Good ELOS: 10-12   Today's Date: 08/30/2022 PT Individual Time: 6295-2841 PT Individual Time Calculation (min): 16 min    Hospital Problem: Principal Problem:   Cerebellar stroke [facility])   Past Medical History:  Past Medical History:  Diagnosis Date   BPH (benign prostatic hypertrophy)    Bradycardia    evaluated by Dr. Nehemiah Massed   Cancer [facility])    prostate   Carpal tunnel syndrome, left    left arm   CKD (chronic kidney disease) stage 2, GFR 60-89 ml/min    Diverticulitis    Gastritis 2017   History of prostate cancer    HNP (herniated nucleus pulposus), lumbar    Hyperlipidemia    Hypertension    IBS (irritable bowel syndrome)    Impotence    Multiple lung nodules    nonspecific, largest 71m in Jan 2016; scanned March 2017; rescan 3-6 months later   OA (osteoarthritis) of knee    right   Renal mass    evaluated; followed by urologist   Sleep apnea    on CPAP   Torn meniscus    x 2; right knee   Past Surgical History:  Past Surgical History:  Procedure Laterality Date   abdominal ultrasound  Oct 2015   Negative for eval for AAA   COLONOSCOPY     KNEE ARTHROSCOPY Right    x 2   LUMBAR LAMINECTOMY     PROSTATECTOMY     robotic    Assessment & Plan Clinical Impression: Patient is a 77year old male with history of BPH, CKD, severe OA B-Knees, CKD 3b,  PVD, HTN w/recent ED visit for dizziness and presyncope [facility] with reports of falls X 2, dizziness, weakness and inability to stand. He was found to have  segmental PE in RUL and LLE. MRI brain revealed acute punctate infarct in posteroinferior pons/right cerebellar peduncle. MRA revealed extensive intracranial atherosclerosis with advance mid basilar and B-PCA  branch stenosis, occluded L-VA and moderate R-M1 segment stenosis as well as severe atheromatous stenosis proximal R-ICA and 50% narrowing proximal L-ICA. BLE dopplers were negative for DVT. He was started on treatment dose Lovenox and    2 D echo showed EF 65-70% with no wall abnormality, severe concentric LVH with G1DD. Dr. SQuinn Axerecommended addition of ASA once DOAC discontinued and to follow up with neurology after discharge.  He has been limited by right knee pain and swelling w/xrays revealing bulky tricompartmental degeneration with small to moderate suprapatellar joint effusion. Oxycodone added for pain control. PT/OT has been working with patient who continues to be limited by balance deficits, right knee instability with pain,dizziness due to vestibular issues as well as right ataxia. CIR recommended due to functional decline. Patient transferred to CIR on 08/29/2022 .   Patient currently requires min with mobility secondary to muscle weakness, decreased cardiorespiratoy endurance, and decreased standing balance and decreased balance strategies.  Prior to hospitalization, patient was modified independent  with mobility and lived with Spouse in a House home.  Home access is 5Stairs to enter.  Patient will benefit from skilled PT intervention to maximize safe functional mobility, minimize fall risk, and decrease caregiver burden for planned discharge home with 24 hour supervision.  Anticipate patient will benefit from follow up  OP at discharge.  PT - End of Session Activity Tolerance: Tolerates 10 - 20 min activity with multiple rests Endurance Deficit: Yes Endurance Deficit Description: Seated rest break w/ simple functional mobility tasks PT Assessment Rehab Potential (ACUTE/IP ONLY): Good PT Barriers to Discharge: Insurance for SNF coverage;Weight PT Patient demonstrates impairments in the following area(s): Balance;Endurance;Motor PT Transfers Functional Problem(s): Bed Mobility;Bed to  Chair;Car PT Locomotion Functional Problem(s): Ambulation;Stairs PT Plan PT Intensity: Minimum of 1-2 x/day ,45 to 90 minutes PT Frequency: 5 out of 7 days PT Duration Estimated Length of Stay: 10-12 PT Treatment/Interventions: Ambulation/gait training;Discharge planning;Functional mobility training;Psychosocial support;Therapeutic Activities;Balance/vestibular training;Disease management/prevention;Neuromuscular re-education;Skin care/wound management;Therapeutic Exercise;UE/LE Strength taining/ROM;Splinting/orthotics;Pain management;DME/adaptive equipment instruction;Cognitive remediation/compensation;Community reintegration;Functional electrical stimulation;Patient/family education;Stair training;UE/LE Coordination activities;Wheelchair propulsion/positioning;Visual/perceptual remediation/compensation PT Transfers Anticipated Outcome(s): mod I PT Locomotion Anticipated Outcome(s): mod I PT Recommendation Follow Up Recommendations: Outpatient PT Patient destination: Home Equipment Recommended: To be determined   PT Evaluation Precautions/Restrictions Precautions Precautions: Fall Restrictions Weight Bearing Restrictions: No Pain Interference Pain Interference Pain Effect on Sleep: 1. Rarely or not at all Pain Interference with Therapy Activities: 1. Rarely or not at all Pain Interference with Day-to-Day Activities: 1. 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 [place] - History Ability to See in Adequate Light: 0 Adequate Vision - Assessment Eye Alignment: Within Functional Limits Ocular Range of Motion: Within Functional Limits Alignment/Gaze Preference: Within Defined Limits Tracking/Visual Pursuits: Able to track stimulus in all quads without difficulty Saccades: Additional eye shifts occurred during testing Convergence: Within functional limits Additional Comments: pt has significant difficulty maintaing visual fixation with head movement Perception Perception: Within Functional Limits Praxis Praxis: Intact  Cognition Overall Cognitive Status: Within Functional Limits for tasks assessed Arousal/Alertness: Awake/alert Orientation Level: Oriented X4 Attention: Focused;Sustained;Selective Focused Attention: Appears intact Sustained Attention: Appears intact Selective Attention: Appears intact Memory: Appears intact Awareness: Appears intact Problem Solving: Appears intact Safety/Judgment: Appears intact Comments: Will continue to monitor in functional context Sensation Sensation Light Touch: Appears Intact Hot/Cold: Appears Intact Proprioception: Appears Intact Stereognosis: Appears Intact Coordination Gross Motor Movements are Fluid and Coordinated: No Fine Motor Movements are Fluid and Coordinated: No (limited by B knee pain, R knee with edema) Coordination and Movement Description: Limited by B knee pain Motor  Motor Motor: Within Functional Limits   Trunk/Postural Assessment  Cervical Assessment Cervical Assessment: Within Functional Limits Thoracic Assessment Thoracic Assessment: Within Functional Limits Lumbar Assessment Lumbar Assessment: Within Functional Limits Postural Control Postural Control: Within Functional Limits  Balance Balance Balance Assessed: Yes Static Sitting  Balance Static Sitting - Balance Support: Feet supported;No upper extremity supported Static Sitting - Level of Assistance: 7: Independent Dynamic Sitting Balance Dynamic Sitting - Balance Support: Feet supported;No upper extremity supported Dynamic Sitting - Level of Assistance: 5: Stand by assistance Sitting balance - Comments: mild R lean Static Standing Balance Static Standing - Balance Support: Bilateral upper extremity supported Static Standing - Level of Assistance: 5: Stand by assistance Dynamic Standing Balance Dynamic Standing - Balance  Support: Bilateral upper extremity supported;During functional activity Dynamic Standing - Level of Assistance: 4: Min assist Extremity Assessment  RUE Assessment RUE Assessment: Within Functional Limits General Strength Comments: 100 lbs of grip strength LUE Assessment LUE Assessment: Within Functional Limits General Strength Comments: 95 lbs of grip strength RLE Assessment RLE Assessment: Exceptions to [facility] RLE Strength RLE Overall Strength: Deficits Right Hip Flexion: 2+/5 Right Hip ABduction: 3+/5 Right Hip ADduction: 3+/5 Right Knee Flexion: 3+/5 Right Knee Extension: 4/5 Right Ankle Dorsiflexion: 4/5 LLE Assessment LLE Assessment: Exceptions to WFL LLE Strength LLE Overall Strength: Deficits Left Hip Flexion: 2+/5 Left Hip ABduction: 3+/5 Left Hip ADduction: 3+/5 Left Knee Flexion: 3+/5 Left Knee Extension: 4/5 Left Ankle Dorsiflexion: 4/5  Care Tool Care Tool Bed Mobility Roll left and right activity   Roll left and right assist level: Independent    Sit to lying activity   Sit to lying assist level: Supervision/Verbal cueing    Lying to sitting on side of bed activity   Lying to sitting on side of bed assist level: the ability to move from lying on the back to sitting on the side of the bed with no back support.: Supervision/Verbal cueing     Care Tool Transfers Sit to stand transfer   Sit to stand assist level:  Minimal Assistance - Patient > 75%    Chair/bed transfer   Chair/bed transfer assist level: Contact Guard/Touching assist     Toilet transfer   Assist Level: Contact Guard/Touching assist    Car transfer   Car transfer assist level: Minimal Assistance - Patient > 75%      Care Tool Locomotion Ambulation   Assist level: Contact Guard/Touching assist Assistive device: Walker-rolling Max distance: 100'  Walk 10 feet activity   Assist level: Contact Guard/Touching assist Assistive device: Walker-rolling   Walk 50 feet with 2 turns activity   Assist level: Contact Guard/Touching assist Assistive device: Walker-rolling  Walk 150 feet activity Walk 150 feet activity did not occur: Safety/medical concerns (fatigue)      Walk 10 feet on uneven surfaces activity Walk 10 feet on uneven surfaces activity did not occur: Safety/medical concerns      Stairs   Assist level: Contact Guard/Touching assist Stairs assistive device: 2 hand rails Max number of stairs: 12  Walk up/down 1 step activity   Walk up/down 1 step (curb) assist level: Contact Guard/Touching assist Walk up/down 1 step or curb assistive device: 2 hand rails  Walk up/down 4 steps activity   Walk up/down 4 steps assist level: Contact Guard/Touching assist Walk up/down 4 steps assistive device: 2 hand rails  Walk up/down 12 steps activity   Walk up/down 12 steps assist level: Contact Guard/Touching assist Walk up/down 12 steps assistive device: 2 hand rails  Pick up small objects from floor Pick up small object from the floor (from standing position) activity did not occur: Safety/medical concerns      Wheelchair Is the patient using a wheelchair?: Yes Type of Wheelchair: Manual   Wheelchair assist level: Moderate Assistance - Patient 50 - 74% Max wheelchair distance: 82'  Wheel 50 feet with 2 turns activity   Assist Level: Moderate Assistance - Patient 50 - 74%  Wheel 150 feet activity   Assist Level: Maximal  Assistance - Patient 25 - 49%    Refer to Care Plan for Long Term Goals  SHORT TERM GOAL WEEK 1 PT Short Term Goal 1 (Week 1): Pt will complete bed mobility with supervision PT Short Term  Goal 2 (Week 1): Pt will complete bed<>Chair transfers with supervision PT Short Term Goal 3 (Week 1): Pt will ambulate 120f with CGA and LRAD PT Short Term Goal 4 (Week 1): Pt will participate in functional outcome measure to address falls risk  Recommendations for other services: None   Skilled Therapeutic Intervention Mobility Transfers Transfers: Sit to Stand;Stand to Sit;Stand Pivot Transfers Sit to Stand: Contact Guard/Touching assist Stand to Sit: Contact Guard/Touching assist Stand Pivot Transfers: Contact Guard/Touching assist Locomotion  Gait Ambulation: Yes Gait Assistance: Minimal Assistance - Patient > 75%;Contact Guard/Touching assist Gait Distance (Feet): 100 Feet Assistive device: Rolling walker Gait Assistance Details: Verbal cues for safe use of DME/AE;Verbal cues for gait pattern;Verbal cues for precautions/safety;Tactile cues for posture Gait Gait: Yes Gait Pattern: Impaired Gait Pattern: Decreased stride length;Trunk flexed;Antalgic Stairs / Additional Locomotion Stairs: Yes Stairs Assistance: Contact Guard/Touching assist Stair Management Technique: Two rails;Step to pattern;Forwards Number of Stairs: 12 Height of Stairs: 6 Wheelchair Mobility Wheelchair Mobility: Yes Wheelchair Assistance: SChartered loss adjuster Both upper extremities Wheelchair Parts Management: Needs assistance Distance: 50'  Skilled Intervention: Pt sitting in w/c finishing his lunch to start. Daughter and wife at bedside - pt in agreement to PT evaluation. Confirmed social factors and PLOF - pt lives with spouse in 1 lvl home with 5 STE - was independent prior to admission and would use a cane at times when his arthritic knees were hurting. Family reports since his  CVA, he had recurrent falls at home due to balance deficits. Family confirms 24/7 available, multiple family members who live nearby who can assist. Pt completed functional mobility as outlined above. Presents primarily with impaired standing balance, generalized weakness and deconditioning. Strength symmetrical. Concluded session seated in w/c with safety belt alarm on and call bell in lap. All needs met.   Discharge Criteria: Patient will be discharged from PT if patient refuses treatment 3 consecutive times without medical reason, if treatment goals not met, if there is a change in medical status, if patient makes no progress towards goals or if patient is discharged from hospital.  The above assessment, treatment plan, treatment alternatives and goals were discussed and mutually agreed upon: by patient and by family  CAlger SimonsPT, DPT 08/30/2022, 2:28 PM

## 2022-08-30 NOTE — Progress Notes (Signed)
   08/30/22 1445  Spiritual Encounters  Type of Visit Initial  Care provided to: Patient  Referral source Nurse (RN/NT/LPN)  Reason for visit Advance directives   Chaplain responded to a spiritual consult request for advanced directive education. The patient, Izyan stated that it was time he did this paperwork. We went over the forms that were needed completion. Warden plans to work on this when his family arrives. I encouraged him to let his nurse know if he has any questions and a chaplain will respond.   Danice Goltz [facility]  [phone]

## 2022-08-30 NOTE — Evaluation (Signed)
Speech Language Pathology Assessment and Plan  Patient Details  Name: Jared Cline MRN: [id number] Date of Birth: 10-19-1945  SLP Diagnosis: N/A Rehab Potential: N/A ELOS: N/A   Today's Date: 08/30/2022 SLP Individual Time: 4132-4401 SLP Individual Time Calculation (min): 48 min   Hospital Problem: Principal Problem:   Cerebellar stroke [facility])  Past Medical History:  Past Medical History:  Diagnosis Date   BPH (benign prostatic hypertrophy)    Bradycardia    evaluated by Dr. Nehemiah Massed   Cancer [facility])    prostate   Carpal tunnel syndrome, left    left arm   CKD (chronic kidney disease) stage 2, GFR 60-89 ml/min    Diverticulitis    Gastritis 2017   History of prostate cancer    HNP (herniated nucleus pulposus), lumbar    Hyperlipidemia    Hypertension    IBS (irritable bowel syndrome)    Impotence    Multiple lung nodules    nonspecific, largest 23m in Jan 2016; scanned March 2017; rescan 3-6 months later   OA (osteoarthritis) of knee    right   Renal mass    evaluated; followed by urologist   Sleep apnea    on CPAP   Torn meniscus    x 2; right knee   Past Surgical History:  Past Surgical History:  Procedure Laterality Date   abdominal ultrasound  Oct 2015   Negative for eval for AAA   COLONOSCOPY     KNEE ARTHROSCOPY Right    x 2   LUMBAR LAMINECTOMY     PROSTATECTOMY     robotic    Assessment / Plan / Recommendation Clinical Impression Patient is a 77y/o male with PMH of prostate can s/p prostatectomy, HTN, CAD, CKD III, OSA, and complex renal cysts [facility]. Reports several day hx of dizziness/weakness prior to admit that resulted in multiple falls. In ED BP 145/66, blood work unremarkable. MRI brain revealed acute punctate infarct in posteroinferior pons/right cerebellar Neurology was consulted and recommended full workup. MRA head/neck showed extensive intracranial atherosclerosis, advanced midbasilar and  bilteral PCA stenosis, right M1 stenosis, and occluded left vertebral artery in the neck, and well as severe atheromatous stenosis at the proximal right ICA. Pt found to have segmental PE and started on eliquis; benefit of anticoagulation to outweigh risks.  Therapy evaluations completed with recommendations for CIR. Patient admitted 08/29/22.   Upon arrival, patient was awake while upright in the bed and agreeable to SLP evaluation. Patient was pleasant and cooperative throughout evaluation and did not endorse any cognitive changes since admission. Patient's [facility] patient was 100% intelligible throughout informal conversation. Patient's auditory comprehension and verbal expression also appeared [facility] all tasks assessed. SLP administered the Cognistat and patient demonstrated mild deficits in calculations (which patient endorsed is baseline for which he utilizes pen/paper or a calculator) and short-term recall. However, during informal assessment, patient's overall intellectual awareness, sustained attention, functional problem solving, safety awareness, and recall of functional information appeared [facility] patient is at his cognitive baseline, therefore, skilled SLP intervention is not warranted at this time. Patient verbalized understanding and agreement. Patient left upright in bed with alarm on and all needs within reach.     Skilled Therapeutic Interventions          Administered a cognitive-linguistic evaluation, please see above for details.   SLP Assessment  Patient does not need any further [facility]  Recommendations  Oral Care Recommendations: Oral care BID Patient destination: Home Follow up Recommendations: None Equipment Recommended: None recommended by SLP    SLP Frequency N/A  SLP Duration  SLP Intensity  SLP Treatment/Interventions N/A  N/A  N/A   Pain No/Denies Pain   Prior Functioning Type of Home: House  Lives With:  Spouse Available Help at Discharge: Family;Available 24 hours/day Vocation: Retired  Programmer, systems Overall Cognitive Status: Within Functional Limits for tasks assessed Arousal/Alertness: Awake/alert Orientation Level: Oriented X4 Memory: Appears intact Awareness: Appears intact Problem Solving: Appears intact Safety/Judgment: Appears intact  Comprehension Auditory Comprehension Overall Auditory Comprehension: Appears within functional limits for tasks assessed Expression Expression Primary Mode of Expression: Verbal Verbal Expression Overall Verbal Expression: Appears within functional limits for tasks assessed Written Expression Dominant Hand: Right Oral Motor Oral Motor/Sensory Function Overall Oral Motor/Sensory Function: Within functional limits Motor Speech Overall Motor Speech: Appears within functional limits for tasks assessed  Care Tool Care Tool Cognition Ability to hear (with hearing aid or hearing appliances if normally used Ability to hear (with hearing aid or hearing appliances if normally used): 0. Adequate - no difficulty in normal conservation, social interaction, listening to TV   Expression of Ideas and Wants Expression of Ideas and Wants: 4. Without difficulty (complex and basic) - expresses complex messages without difficulty and with speech that is clear and easy to understand   Understanding Verbal and Non-Verbal Content Understanding Verbal and Non-Verbal Content: 4. Understands (complex and basic) - clear comprehension without cues or repetitions  Memory/Recall Ability Memory/Recall Ability : Current season;Location of own room;That he or she is in a hospital/hospital unit     Short Term Goals: N/A   Refer to Care Plan for Long Term Goals  Recommendations for other services: None   Discharge Criteria: Patient will be discharged from SLP if patient refuses treatment 3 consecutive times without medical reason, if treatment goals not  met, if there is a change in medical status, if patient makes no progress towards goals or if patient is discharged from hospital.  The above assessment, treatment plan, treatment alternatives and goals were discussed and mutually agreed upon: by patient  Dameka Younker 08/30/2022, 1:25 PM

## 2022-08-31 NOTE — Progress Notes (Signed)
Occupational Therapy Session Note  Patient Details  Name: Jared Cline MRN: [id number] Date of Birth: 1946/02/16  Today's Date: 08/31/2022 OT Individual Time: 1120-1202 OT Individual Time Calculation (min): 42 min    Short Term Goals: Week 1:  OT Short Term Goal 1 (Week 1): Pt will be able to ambulate to bathroom with S using LRAD. OT Short Term Goal 2 (Week 1): Pt will complete LB dressing with supervision. OT Short Term Goal 3 (Week 1): Pt will complete shower with set up A/S. OT Short Term Goal 4 (Week 1): Pt will demonstrate independence with completing visual fixation exercises.  Skilled Therapeutic Interventions/Progress Updates:    Pt received in bed with no pain reported   BP throughout session Beginning of session seated 125/82 After functional mobility: 140/75 After 2 rounds standing ball toss: 176/78 After 3 min seated rest: 135/80  Therapeutic activity Functional mobility with RW with pt pushing up with BUE on armrests causing retropulsion and needing to bring BLE back underneath him. Cuing for scooting forward to [place] prior to standing/walking in gym. Functional mobility ~100 feet with RW and CGA with cuing to keep toes forward  2x20 ball toss in standing to re-bounder with no overt LOB and cuing for breathing as pt visibly holding breath especially on first round.   Pt left at end of session in bed with exit alarm on, call light in reach and all needs met   Therapy Documentation Precautions:  Precautions Precautions: Fall Restrictions Weight Bearing Restrictions: No General:    Therapy/Group: Individual Therapy  Tonny Branch 08/31/2022, 6:32 AM

## 2022-08-31 NOTE — Progress Notes (Signed)
Pt has home cpap self adm

## 2022-08-31 NOTE — Progress Notes (Signed)
PROGRESS NOTE   Subjective/Complaints:  Ortho came for arthrocentesis but pt was out of room Pt notes knee pain and swelling  Review of systems negative for chest pain or shortness of breath  Objective:   No results found. Recent Labs    08/30/22 0556  WBC 5.1  HGB 12.3*  HCT 37.0*  PLT 336    Recent Labs    08/29/22 0520 08/30/22 0556  NA 131* 130*  K 4.4 4.3  CL 102 100  CO2 20* 21*  GLUCOSE 96 94  BUN 32* 24*  CREATININE 1.31* 1.22  CALCIUM 9.0 9.1     Intake/Output Summary (Last 24 hours) at 08/31/2022 8295 Last data filed at 08/31/2022 0755 Gross per 24 hour  Intake 720 ml  Output 600 ml  Net 120 ml         Physical Exam: Vital Signs Blood pressure (!) 151/72, pulse 65, temperature 98.1 F (36.7 C), temperature source Oral, resp. rate 16, height 6' (1.829 m), weight 98.6 kg, SpO2 100 %.   General: No acute distress Mood and affect are appropriate Heart: Regular rate and rhythm no rubs murmurs or extra sounds Lungs: Clear to auscultation, breathing unlabored, no rales or wheezes Abdomen: Positive bowel sounds, soft nontender to palpation, nondistended Extremities: No clubbing, cyanosis, or edema Skin: No evidence of breakdown, no evidence of rash Neurologic: Cranial nerves II through XII intact, motor strength is 5/5 in bilateral deltoid, bicep, tricep, grip, hip flexor, knee extensors, ankle dorsiflexor and plantar flexor Sensory exam normal sensation to light touch and proprioception in bilateral upper and lower extremities Cerebellar exam normal finger to nose to finger as well as heel to shin in bilateral upper and lower extremities Musculoskeletal:RIght knee effusion noted suprapatellar bursa   Assessment/Plan: 1. Functional deficits which require 3+ hours per day of interdisciplinary therapy in a comprehensive inpatient rehab setting. Physiatrist is providing close team supervision and  24 hour management of active medical problems listed below. Physiatrist and rehab team continue to assess barriers to discharge/monitor patient progress toward functional and medical goals  Care Tool:  Bathing    Body parts bathed by patient: Right arm, Left arm, Chest, Abdomen, Front perineal area, Buttocks, Right upper leg, Left upper leg, Face, Left lower leg, Right lower leg         Bathing assist Assist Level: Contact Guard/Touching assist     Upper Body Dressing/Undressing Upper body dressing   What is the patient wearing?: Pull over shirt    Upper body assist Assist Level: Set up assist    Lower Body Dressing/Undressing Lower body dressing      What is the patient wearing?: Underwear/pull up, Pants     Lower body assist Assist for lower body dressing: Contact Guard/Touching assist     Toileting Toileting    Toileting assist Assist for toileting: Contact Guard/Touching assist     Transfers Chair/bed transfer  Transfers assist     Chair/bed transfer assist level: Contact Guard/Touching assist     Locomotion Ambulation   Ambulation assist      Assist level: Contact Guard/Touching assist Assistive device: Walker-rolling Max distance: 100'   Walk 10 feet activity  Assist     Assist level: Contact Guard/Touching assist Assistive device: Walker-rolling   Walk 50 feet activity   Assist    Assist level: Contact Guard/Touching assist Assistive device: Walker-rolling    Walk 150 feet activity   Assist Walk 150 feet activity did not occur: Safety/medical concerns (fatigue)         Walk 10 feet on uneven surface  activity   Assist Walk 10 feet on uneven surfaces activity did not occur: Safety/medical concerns         Wheelchair     Assist Is the patient using a wheelchair?: Yes Type of Wheelchair: Manual    Wheelchair assist level: Moderate Assistance - Patient 50 - 74% Max wheelchair distance: 21'    Wheelchair 50  feet with 2 turns activity    Assist        Assist Level: Moderate Assistance - Patient 50 - 74%   Wheelchair 150 feet activity     Assist      Assist Level: Maximal Assistance - Patient 25 - 49%   Blood pressure (!) 151/72, pulse 65, temperature 98.1 F (36.7 C), temperature source Oral, resp. rate 16, height 6' (1.829 m), weight 98.6 kg, SpO2 100 %.  Medical Problem List and Plan: 1. Functional deficits secondary to R ponto-cerebellar CVA             -patient may  shower             -ELOS/Goals: 10-14 days, supervision PT/OT, Ind SLP             - Primary barriers: balance and coordination deficits, R knee pain, 5 STE home 2. New diagnosis segmental PE/Antithrombotics: -DVT/anticoagulation:  Pharmaceutical: Eliquis- at least 75mo            -antiplatelet therapy: N/A (ASA after DOAC completed)  3. Pain Management: oxycodone prn for knee pain.              - Pre-medicate AM therapies to allow participation             - See management of R knee effusion below 4. Mood/Behavior/Sleep: LCSW to follow for evaluation and support.              --Trazodone prn for sleep.              -antipsychotic agents: N/A 5. Neuropsych/cognition: This patient is capable of making decisions on his own behalf. 6. Skin/Wound Care: Routine pressure relief measures.  7. Fluids/Electrolytes/Nutrition: Monitor I/O. Check CMET in am.  8. HTN: Monitor BP TID- continue     08/29/2022    9:53 PM 08/29/2022    8:39 PM 08/29/2022    4:27 PM  Vitals with BMI  Systolic [id number] Diastolic 70 72 70  Pulse   65 59    9. CKD 3A: Baseline SCr 1.3-1.4. Reports mild dysuria since admission. Encourage fluid intake.  BUN/Cr improved 1/12 vs 1/11 10. OSA: Continue CPAP 11. Prostate cancer s/p prostatectomy: 12. Right knee OA/effusion: Used to get his knee injected every 6 months and missed an appt last week. --Add voltaren gel QID with aquatherapy prn.  --Oxycodone prn pain--may need premedication.   --consult ortho for aspiration. Pending results, would consider steroid injection (was due for OP bilateral injections prior to admission) 13. Hyponatremia: Borderline low. Continue to monitor sodium levels. Stable valus 1/12 vs 1/11 14. Acute blood loss anemia: Recheck in am 15. Constipation: Has not had BM in 7  days but passing gas and no pain or nausea. --Will try sorbitol tonight.       LOS: 2 days A FACE TO FACE EVALUATION WAS PERFORMED  Charlett Blake 08/31/2022, 9:09 AM

## 2022-08-31 NOTE — Progress Notes (Signed)
Physical Therapy Session Note  Patient Details  Name: Jared Cline MRN: [id number] Date of Birth: 04-10-1946  Today's Date: 08/31/2022 PT Individual Time: 0807-0906 and 1941-7408 PT Individual Time Calculation (min): 59 min and 59 min  Short Term Goals: Week 1:  PT Short Term Goal 1 (Week 1): Pt will complete bed mobility with supervision PT Short Term Goal 2 (Week 1): Pt will complete bed<>Chair transfers with supervision PT Short Term Goal 3 (Week 1): Pt will ambulate 165f with CGA and LRAD PT Short Term Goal 4 (Week 1): Pt will participate in functional outcome measure to address falls risk  Skilled Therapeutic Interventions/Progress Updates:    Session 1: Pt received supported up in bed talking on the phone with his wife and agreeable to therapy session. Reports he had an episode of high BP requiring additional medications last night/this morning. Supine>sitting R EOB, HOB partially elevated, with supervision and increased effort.  Sitting EOB vitals: BP 157/104 (MAP 119), HR 80bpm   Sit>stand slightly elevated EOB>RW with CGA for steadying/safety - pt with heavy reliance on B UE support to rise into standing (may be a compensatory strategy too due to B knee pain) but lacks adequate anterior trunk flexion at hips. Stand pivot to w/c using RW with CGA for safety - pt continues to demo increased reliance on B UE support on RW.   Transported to/from gym in w/c for time management and energy conservation.  Gait training ~1044fusing RW with CGA for safety - mild excessive R LE hip external rotation and adduction throughout swing, cuing to improve - slight increase in B UE support on RW - slow gait speed with min but reciprocal stepping pattern.  Vitals after gait: BP 208/72 (MAP 107), HR 84bpm  After ~11m23mseated rest break: BP 178/82 (MAP 106), HR 79bpm   Notified nurse and MD of pt's elevated BP - nurse present during session for medication administration.  [facility] demonstrates increased fall risk as noted by score of  5/56.  (<36= high risk for falls, close to 100%; 37-45 significant >80%; 46-51 moderate >50%; 52-55 lower >25%). Pt unable to maintain static standing balance without consistent min assist - during 2nd static standing balance trial pt able to maintain static standing with close guarding for ~10seconds until requiring min assist again.  Vitals after Berg: BP 188/82 (MAP 111), HR 83bpm   R stand pivot EOM>w/c using HHA on therapist to challenge balance with min assist.  Transported back to room and pt agreeable to remain up in w/c - left with needs in reach and seat belt alarm on.     Session 2: Pt received sitting in w/c and agreeable to therapy session. Transported to/from gym in w/c for time management and energy conservation.  Vitals sitting in w/c to start session: BP 148/70 (MAP 92), HR 58bpm   Sit>stand w/c>B UE support on litegait handles with min assist - pt with good recall of training earlier on increased anterior trunk flexion at hips to come to standing.   Standing with B UE support on litegait handles and CGA for safety while donning/doffing litegait harness during session.  Gait training overground in litegait harness for safety but not providing BWS nor balance support: - ~180f72fing B UE progressed to only L UE support but then pt had R knee buckle/giving way so returned to B UE support, continues to have minor excessive R LE hip external rotation with excessive adduction, decreased gait  speed, decreased BLE step lengths to be only slightly reciprocal - vitals after 1st walk: BP 184/67 (MAP 93), HR 67bpm; reassessed after ~4 min seated rest break: BP 156/73 (MAP 94), HR 61bpm  - ~187f with pt having increasing R knee pain during this gait trial so having slower gait speed with even shorter step lengths that progressively declined towards the end vitals after 2nd walk: BP 167/73 (MAP 101), HR 65bpm   Nurse  notified and present for Voltaren gel administration for pain management.  Therapist retrieved a slightly taller wheelchair to improve pt's ability to come to standing from a standard seat height due to pt's tall height and bilateral knee pain - if needing an even taller w/c would likely have to get a TIS wheelchair.   At end of session, pt left seated in w/c with needs in reach and seat belt alarm on.  Therapy Documentation Precautions:  Precautions Precautions: Fall Restrictions Weight Bearing Restrictions: No   Pain: Session 1: Reports R knee pain, rated as 7/10 - nurse present for medication administration. Reports he normally lives at ~4-5/10 pain in B knees (R>L) after injections, when at home.  Session 2: Progressively increased R knee pain with gait training - nurse present for Voltaren gel administration - provided seated rest break and discontinued gait training at this time   Balance: Standardized Balance Assessment Standardized Balance Assessment: Berg Balance Test Berg Balance Test Sit to Stand: Needs minimal aid to stand or to stabilize Standing Unsupported: Unable to stand 30 seconds unassisted (R posterior lean) Sitting with Back Unsupported but Feet Supported on Floor or Stool: Able to sit 2 minutes under supervision Stand to Sit: Needs assistance to sit Transfers: Needs one person to assist Standing Unsupported with Eyes Closed: Needs help to keep from falling Standing Ubsupported with Feet Together: Needs help to attain position and unable to hold for 15 seconds From Standing, Reach Forward with Outstretched Arm: Loses balance while trying/requires external support From Standing Position, Pick up Object from Floor: Unable to try/needs assist to keep balance From Standing Position, Turn to Look Behind Over each Shoulder: Needs assist to keep from losing balance and falling Turn 360 Degrees: Needs assistance while turning Standing Unsupported, Alternately Place Feet  on Step/Stool: Needs assistance to keep from falling or unable to try Standing Unsupported, One Foot in Front: Loses balance while stepping or standing Standing on One Leg: Unable to try or needs assist to prevent fall Total Score: 5    Therapy/Group: Individual Therapy  CTawana Scale, PT, DPT, NCS, CSRS 08/31/2022, 7:44 AM

## 2022-08-31 NOTE — IPOC Note (Signed)
Overall Plan of Care [facility]) Patient Details Name: Jared Cline MRN: [id number] DOB: 05-16-1946  Admitting Diagnosis: Cerebellar stroke [facility])  Hospital Problems: Principal Problem:   Cerebellar stroke [facility])     Functional Problem List: Nursing Bladder, Bowel, Pain, Safety, Endurance, Medication Management  PT Balance, Endurance, Motor  OT Balance, Edema, Endurance, Motor  SLP    TR         Basic ADL's: OT Bathing, Dressing, Toileting     Advanced  ADL's: OT       Transfers: PT Bed Mobility, Bed to Chair, Car  OT Tub/Shower, Toilet     Locomotion: PT Ambulation, Stairs     Additional Impairments: OT None  SLP None      TR      Anticipated Outcomes Item Anticipated Outcome  Self Feeding no goal, pt is independent  Swallowing      Basic self-care  Mod I  Toileting  Mod I   Bathroom Transfers Mod I  Bowel/Bladder  manage bowel w mod I and bladder w toileting  Transfers  mod I  Locomotion  mod I  Communication     Cognition     Pain  < 4 with prns  Safety/Judgment  manage w cues   Therapy Plan: PT Intensity: Minimum of 1-2 x/day ,45 to 90 minutes PT Frequency: 5 out of 7 days PT Duration Estimated Length of Stay: 10-12 OT Intensity: Minimum of 1-2 x/day, 45 to 90 minutes OT Frequency: 5 out of 7 days OT Duration/Estimated Length of Stay: 10-12 days     Team Interventions: Nursing Interventions Bladder Management, Bowel Management, Disease Management/Prevention, Medication Management, Discharge Planning, Patient/Family Education, Pain Management  PT interventions Ambulation/gait training, Discharge planning, Functional mobility training, Psychosocial support, Therapeutic Activities, Balance/vestibular training, Disease management/prevention, Neuromuscular re-education, Skin care/wound management, Therapeutic Exercise, UE/LE Strength taining/ROM, Splinting/orthotics, Pain management, DME/adaptive equipment instruction, Cognitive  remediation/compensation, Community reintegration, Technical sales engineer stimulation, Patient/family education, IT trainer, UE/LE Coordination activities, Wheelchair propulsion/positioning, Visual/perceptual remediation/compensation  OT Interventions Training and development officer, DME/adaptive equipment instruction, Discharge planning, Functional mobility training, Neuromuscular re-education, Psychosocial support, Patient/family education, Pain management, Self Care/advanced ADL retraining, UE/LE Strength taining/ROM, Therapeutic Exercise, Therapeutic Activities, Visual/perceptual remediation/compensation  SLP Interventions    TR Interventions    SW/CM Interventions Discharge Planning, Psychosocial Support, Patient/Family Education   Barriers to Discharge MD   Right knee effusion , HTN  Nursing Decreased caregiver support, Home environment access/layout 1 level 6 ste right rail w spouse  PT Insurance for SNF coverage, Weight    OT      SLP      SW       Team Discharge Planning: Destination: PT-Home ,OT- Home , SLP-Home Projected Follow-up: PT-Outpatient PT, OT-  Home health OT, SLP-None Projected Equipment Needs: PT-To be determined, OT- To be determined, SLP-None recommended by SLP Equipment Details: PT- , OT-pt may need a BSC and a tub bench, will need to discuss with spouse and pt more. Patient/family involved in discharge planning: PT- Patient,  OT-Patient, SLP-Patient  MD ELOS: 10-14d Medical Rehab Prognosis:  Good Assessment: The patient has been admitted for CIR therapies with the diagnosis of cerebellar stroke. The team will be addressing functional mobility, strength, stamina, balance, safety, adaptive techniques and equipment, self-care, bowel and bladder mgt, patient and caregiver education, Right knee effusion, HTN. Goals have been set at Mod I. Anticipated discharge destination is Home .        See Team Conference Notes for weekly updates to the plan of care

## 2022-08-31 NOTE — Progress Notes (Signed)
Occupational Therapy Session Note  Patient Details  Name: Jared Cline MRN: [id number] Date of Birth: 10-20-1945  Today's Date: 08/31/2022 OT Individual Time: 1017-5102 OT Individual Time Calculation (min): 30 min    Short Term Goals: Week 1:  OT Short Term Goal 1 (Week 1): Pt will be able to ambulate to bathroom with S using LRAD. OT Short Term Goal 2 (Week 1): Pt will complete LB dressing with supervision. OT Short Term Goal 3 (Week 1): Pt will complete shower with set up A/S. OT Short Term Goal 4 (Week 1): Pt will demonstrate independence with completing visual fixation exercises.  Skilled Therapeutic Interventions/Progress Updates:    Patient received seated in wheelchair.  Agreeable to OT session.  Patient transported to therapy gym to address transitional movements.  Patient effectively using strategies to manage motion sensitivity/ dizziness.  Patient moving slowly and using gaze stabilization to help manage symptoms.   Worked on mechanics of sit to stand and stand to sit. Patient needing cueing for upright posture - and maintaining upright posture through lift off of surface.  Patient does rely on UE's to push off surface - h/o arthritis in right knee.  Patient also uses UE's when returning to sit from standing as controlled descent is challenging with R knee and insufficient forward weight shift.  Patient with excessive cervical extension (used for gaze stabilization) may opt to pick a slightly lower visual target to aide with transitions.  Patient transported back to room and left up in wheelchair with safety belt in place and engaged and call bell/ personal items in reach.    Therapy Documentation Precautions:  Precautions Precautions: Fall Restrictions Weight Bearing Restrictions: No  Pain: Pain Assessment Pain Scale: 0-10 Pain Score: 0-No pain Pain Type: Acute pain Pain Location: Knee Pain Orientation: Right Pain Descriptors / Indicators: Aching Pain Frequency:  Occasional Pain Onset: With Activity Pain Intervention(s): Medication (See eMAR)  Therapy/Group: Individual Therapy  Mariah Milling 08/31/2022, 10:37 AM

## 2022-09-01 MED ORDER — SORBITOL 70 % SOLN
30.0000 mL | Freq: Once | Status: AC
  Administered 2022-09-01: 30 mL via ORAL
  Filled 2022-09-01: qty 30

## 2022-09-01 NOTE — Progress Notes (Signed)
PROGRESS NOTE   Subjective/Complaints:  Pt reports got miralax this AM- feels constipated- says it's been 2-3 days since LBM- but feels constipated.     Review of systems :  Pt denies SOB, abd pain, CP, N/V/ (+)C/D, and vision changes   Objective:   No results found. Recent Labs    08/30/22 0556  WBC 5.1  HGB 12.3*  HCT 37.0*  PLT 336   Recent Labs    08/30/22 0556  NA 130*  K 4.3  CL 100  CO2 21*  GLUCOSE 94  BUN 24*  CREATININE 1.22  CALCIUM 9.1    Intake/Output Summary (Last 24 hours) at 09/01/2022 1744 Last data filed at 09/01/2022 1311 Gross per 24 hour  Intake 948 ml  Output 800 ml  Net 148 ml        Physical Exam: Vital Signs Blood pressure (!) 150/55, pulse 64, temperature (!) 97.5 F (36.4 C), temperature source Oral, resp. rate 18, height 6' (1.829 m), weight 98.6 kg, SpO2 100 %.    General: awake, alert, appropriate, supine in bed; sleepy- just woke up; NAD HENT: oropharynx moist CV: regular rate; no JVD Pulmonary: CTA B/L; no W/R/R- good air movement GI: pretty firm; NT; distended; hypoactive BS Psychiatric: appropriate- flat Neurological: Ox3 Skin: No evidence of breakdown, no evidence of rash Neurologic: Cranial nerves II through XII intact, motor strength is 5/5 in bilateral deltoid, bicep, tricep, grip, hip flexor, knee extensors, ankle dorsiflexor and plantar flexor Sensory exam normal sensation to light touch and proprioception in bilateral upper and lower extremities Cerebellar exam normal finger to nose to finger as well as heel to shin in bilateral upper and lower extremities Musculoskeletal:RIght knee effusion noted suprapatellar bursa   Assessment/Plan: 1. Functional deficits which require 3+ hours per day of interdisciplinary therapy in a comprehensive inpatient rehab setting. Physiatrist is providing close team supervision and 24 hour management of active medical  problems listed below. Physiatrist and rehab team continue to assess barriers to discharge/monitor patient progress toward functional and medical goals  Care Tool:  Bathing    Body parts bathed by patient: Right arm, Left arm, Chest, Abdomen, Front perineal area, Buttocks, Right upper leg, Left upper leg, Face, Left lower leg, Right lower leg         Bathing assist Assist Level: Contact Guard/Touching assist     Upper Body Dressing/Undressing Upper body dressing   What is the patient wearing?: Pull over shirt    Upper body assist Assist Level: Set up assist    Lower Body Dressing/Undressing Lower body dressing      What is the patient wearing?: Underwear/pull up, Pants     Lower body assist Assist for lower body dressing: Contact Guard/Touching assist     Toileting Toileting    Toileting assist Assist for toileting: Contact Guard/Touching assist     Transfers Chair/bed transfer  Transfers assist     Chair/bed transfer assist level: Contact Guard/Touching assist     Locomotion Ambulation   Ambulation assist      Assist level: Contact Guard/Touching assist Assistive device: Walker-rolling Max distance: 100'   Walk 10 feet activity   Assist  Assist level: Contact Guard/Touching assist Assistive device: Walker-rolling   Walk 50 feet activity   Assist    Assist level: Contact Guard/Touching assist Assistive device: Walker-rolling    Walk 150 feet activity   Assist Walk 150 feet activity did not occur: Safety/medical concerns (fatigue)         Walk 10 feet on uneven surface  activity   Assist Walk 10 feet on uneven surfaces activity did not occur: Safety/medical concerns         Wheelchair     Assist Is the patient using a wheelchair?: Yes Type of Wheelchair: Manual    Wheelchair assist level: Moderate Assistance - Patient 50 - 74% Max wheelchair distance: 62'    Wheelchair 50 feet with 2 turns  activity    Assist        Assist Level: Moderate Assistance - Patient 50 - 74%   Wheelchair 150 feet activity     Assist      Assist Level: Maximal Assistance - Patient 25 - 49%   Blood pressure (!) 150/55, pulse 64, temperature (!) 97.5 F (36.4 C), temperature source Oral, resp. rate 18, height 6' (1.829 m), weight 98.6 kg, SpO2 100 %.  Medical Problem List and Plan: 1. Functional deficits secondary to R ponto-cerebellar CVA             -patient may  shower             -ELOS/Goals: 10-14 days, supervision PT/OT, Ind SLP             - Primary barriers: balance and coordination deficits, R knee pain, 5 STE home  Con't CIR- PT and OT and SLP 2. New diagnosis segmental PE/Antithrombotics: -DVT/anticoagulation:  Pharmaceutical: Eliquis- at least 64mo            -antiplatelet therapy: N/A (ASA after DOAC completed)  3. Pain Management: oxycodone prn for knee pain.              - Pre-medicate AM therapies to allow participation             - See management of R knee effusion below 4. Mood/Behavior/Sleep: LCSW to follow for evaluation and support.              --Trazodone prn for sleep.              -antipsychotic agents: N/A 5. Neuropsych/cognition: This patient is capable of making decisions on his own behalf. 6. Skin/Wound Care: Routine pressure relief measures.  7. Fluids/Electrolytes/Nutrition: Monitor I/O. Check CMET in am.  8. HTN: Monitor BP TID- continue     08/29/2022    9:53 PM 08/29/2022    8:39 PM 08/29/2022    4:27 PM  Vitals with BMI  Systolic [id number] Diastolic 70 72 70  Pulse   65 59    9. CKD 3A: Baseline SCr 1.3-1.4. Reports mild dysuria since admission. Encourage fluid intake.  BUN/Cr improved 1/12 vs 1/11 10. OSA: Continue CPAP 11. Prostate cancer s/p prostatectomy: 12. Right knee OA/effusion: Used to get his knee injected every 6 months and missed an appt last week. --Add voltaren gel QID with aquatherapy prn.  --Oxycodone prn pain--may  need premedication.  --consult ortho for aspiration. Pending results, would consider steroid injection (was due for OP bilateral injections prior to admission) 13. Hyponatremia: Borderline low. Continue to monitor sodium levels. Stable values 1/12 vs 1/11 14. Acute blood loss anemia: Recheck in am 15. Constipation: Has not  had BM in 7 days but passing gas and no pain or nausea. --Will try sorbitol tonight.    1/14- will give sorbitol again 30cc- if no BM, needs KUB/soap suds enema- will order soap suds enema tonight, but if results, needs KUB    LOS: 3 days A FACE TO FACE EVALUATION WAS PERFORMED  Royal Vandevoort 09/01/2022, 5:44 PM

## 2022-09-01 NOTE — Progress Notes (Signed)
Patient has home CPAP at the bedside and doesn't need assistance with application.  RT check the water chamber per patient request and patient applied mask. RT will continue to monitor.

## 2022-09-02 LAB — CBC WITH DIFFERENTIAL/PLATELET
Abs Immature Granulocytes: 0.02 10*3/uL (ref 0.00–0.07)
Basophils Absolute: 0.1 10*3/uL (ref 0.0–0.1)
Basophils Relative: 1 %
Eosinophils Absolute: 0.1 10*3/uL (ref 0.0–0.5)
Eosinophils Relative: 2 %
HCT: 37.3 % — ABNORMAL LOW (ref 39.0–52.0)
Hemoglobin: 12.3 g/dL — ABNORMAL LOW (ref 13.0–17.0)
Immature Granulocytes: 0 %
Lymphocytes Relative: 33 %
Lymphs Abs: 2.2 10*3/uL (ref 0.7–4.0)
MCH: 27.7 pg (ref 26.0–34.0)
MCHC: 33 g/dL (ref 30.0–36.0)
MCV: 84 fL (ref 80.0–100.0)
Monocytes Absolute: 0.5 10*3/uL (ref 0.1–1.0)
Monocytes Relative: 8 %
Neutro Abs: 3.7 10*3/uL (ref 1.7–7.7)
Neutrophils Relative %: 56 %
Platelets: 425 10*3/uL — ABNORMAL HIGH (ref 150–400)
RBC: 4.44 MIL/uL (ref 4.22–5.81)
RDW: 11.6 % (ref 11.5–15.5)
WBC: 6.6 10*3/uL (ref 4.0–10.5)
nRBC: 0 % (ref 0.0–0.2)

## 2022-09-02 LAB — BASIC METABOLIC PANEL
Anion gap: 9 (ref 5–15)
BUN: 23 mg/dL (ref 8–23)
CO2: 23 mmol/L (ref 22–32)
Calcium: 9.2 mg/dL (ref 8.9–10.3)
Chloride: 100 mmol/L (ref 98–111)
Creatinine, Ser: 1.45 mg/dL — ABNORMAL HIGH (ref 0.61–1.24)
GFR, Estimated: 50 mL/min — ABNORMAL LOW (ref >60)
Glucose, Bld: 99 mg/dL (ref 70–99)
Potassium: 4.5 mmol/L (ref 3.5–5.1)
Sodium: 132 mmol/L — ABNORMAL LOW (ref 135–145)

## 2022-09-02 MED ORDER — SENNOSIDES-DOCUSATE SODIUM 8.6-50 MG PO TABS
3.0000 | ORAL_TABLET | Freq: Every day | ORAL | Status: DC
  Administered 2022-09-02 – 2022-09-09 (×7): 3 via ORAL
  Filled 2022-09-02 (×9): qty 3

## 2022-09-02 NOTE — Progress Notes (Signed)
PROGRESS NOTE   Subjective/Complaints:  Pt pleased with BP control on current meds, discussed discharge med process in general   Good results from sorbitol wants to buy it OTC  Review of systems negative for chest pain or shortness of breath  Objective:   No results found. Recent Labs    09/02/22 0627  WBC 6.6  HGB 12.3*  HCT 37.3*  PLT 425*    Recent Labs    09/02/22 0627  NA 132*  K 4.5  CL 100  CO2 23  GLUCOSE 99  BUN 23  CREATININE 1.45*  CALCIUM 9.2     Intake/Output Summary (Last 24 hours) at 09/02/2022 0740 Last data filed at 09/02/2022 0536 Gross per 24 hour  Intake 952 ml  Output 850 ml  Net 102 ml         Physical Exam: Vital Signs Blood pressure (!) 138/94, pulse 69, temperature 98.1 F (36.7 C), resp. rate 16, height 6' (1.829 m), weight 98.6 kg, SpO2 100 %.   General: No acute distress Mood and affect are appropriate Heart: Regular rate and rhythm no rubs murmurs or extra sounds Lungs: Clear to auscultation, breathing unlabored, no rales or wheezes Abdomen: Positive bowel sounds, soft nontender to palpation, nondistended Extremities: No clubbing, cyanosis, or edema Skin: No evidence of breakdown, no evidence of rash Neurologic: Cranial nerves II through XII intact, motor strength is 5/5 in bilateral deltoid, bicep, tricep, grip, hip flexor, knee extensors, ankle dorsiflexor and plantar flexor Sensory exam normal sensation to light touch and proprioception in bilateral upper and lower extremities Cerebellar exam normal finger to nose to finger as well as heel to shin in bilateral upper and lower extremities Musculoskeletal:RIght knee effusion noted suprapatellar bursa   Assessment/Plan: 1. Functional deficits which require 3+ hours per day of interdisciplinary therapy in a comprehensive inpatient rehab setting. Physiatrist is providing close team supervision and 24 hour management of  active medical problems listed below. Physiatrist and rehab team continue to assess barriers to discharge/monitor patient progress toward functional and medical goals  Care Tool:  Bathing    Body parts bathed by patient: Right arm, Left arm, Chest, Abdomen, Front perineal area, Buttocks, Right upper leg, Left upper leg, Face, Left lower leg, Right lower leg         Bathing assist Assist Level: Contact Guard/Touching assist     Upper Body Dressing/Undressing Upper body dressing   What is the patient wearing?: Pull over shirt    Upper body assist Assist Level: Set up assist    Lower Body Dressing/Undressing Lower body dressing      What is the patient wearing?: Underwear/pull up, Pants     Lower body assist Assist for lower body dressing: Contact Guard/Touching assist     Toileting Toileting    Toileting assist Assist for toileting: Contact Guard/Touching assist     Transfers Chair/bed transfer  Transfers assist     Chair/bed transfer assist level: Contact Guard/Touching assist     Locomotion Ambulation   Ambulation assist      Assist level: Contact Guard/Touching assist Assistive device: Walker-rolling Max distance: 100'   Walk 10 feet activity   Assist  Assist level: Contact Guard/Touching assist Assistive device: Walker-rolling   Walk 50 feet activity   Assist    Assist level: Contact Guard/Touching assist Assistive device: Walker-rolling    Walk 150 feet activity   Assist Walk 150 feet activity did not occur: Safety/medical concerns (fatigue)         Walk 10 feet on uneven surface  activity   Assist Walk 10 feet on uneven surfaces activity did not occur: Safety/medical concerns         Wheelchair     Assist Is the patient using a wheelchair?: Yes Type of Wheelchair: Manual    Wheelchair assist level: Moderate Assistance - Patient 50 - 74% Max wheelchair distance: 47'    Wheelchair 50 feet with 2 turns  activity    Assist        Assist Level: Moderate Assistance - Patient 50 - 74%   Wheelchair 150 feet activity     Assist      Assist Level: Maximal Assistance - Patient 25 - 49%   Blood pressure (!) 138/94, pulse 69, temperature 98.1 F (36.7 C), resp. rate 16, height 6' (1.829 m), weight 98.6 kg, SpO2 100 %.  Medical Problem List and Plan: 1. Functional deficits secondary to R ponto-cerebellar CVA             -patient may  shower             -ELOS/Goals: 10-14 days, supervision PT/OT, Ind SLP             - Primary barriers: balance and coordination deficits, R knee pain, 5 STE home 2. New diagnosis segmental PE/Antithrombotics: -DVT/anticoagulation:  Pharmaceutical: Eliquis- at least 46mo            -antiplatelet therapy: N/A (ASA after DOAC completed)  3. Pain Management: oxycodone prn for knee pain.              - Pre-medicate AM therapies to allow participation             - See management of R knee effusion below 4. Mood/Behavior/Sleep: LCSW to follow for evaluation and support.              --Trazodone prn for sleep.              -antipsychotic agents: N/A 5. Neuropsych/cognition: This patient is capable of making decisions on his own behalf. 6. Skin/Wound Care: Routine pressure relief measures.  7. Fluids/Electrolytes/Nutrition: Monitor I/O. Check CMET in am.  8. HTN: Monitor BP TID- continue     08/29/2022    9:53 PM 08/29/2022    8:39 PM 08/29/2022    4:27 PM  Vitals with BMI  Systolic [id number] Diastolic 70 72 70  Pulse   65 59   mild systolic elevation  On  clonidine, avapro, hydralazine, spironolactone 9. CKD 3A: Baseline SCr 1.3-1.4. Reports mild dysuria since admission. Encourage fluid intake.  BUN/Cr improved 1/12 vs 1/11 10. OSA: Continue CPAP 11. Prostate cancer s/p prostatectomy: 12. Right knee OA/effusion: Used to get his knee injected every 6 months and missed an appt last week. --Add voltaren gel QID with aquatherapy prn.  --Oxycodone  prn pain--may need premedication.  --consult ortho for aspiration. Pending results, would consider steroid injection (was due for OP bilateral injections prior to admission) 13. Hyponatremia: Borderline low. Continue to monitor sodium levels. Stable valus 1/12 vs 1/11 14. Acute blood loss anemia: Recheck in am 15. Constipation: Has not had BM in  7 days but passing gas and no pain or nausea. --Will try sorbitol tonight.       LOS: 4 days A FACE TO FACE EVALUATION WAS PERFORMED  Charlett Blake 09/02/2022, 7:40 AM

## 2022-09-02 NOTE — Progress Notes (Signed)
Occupational Therapy Session Note  Patient Details  Name: Jrue Jarriel MRN: [id number] Date of Birth: May 27, 1946  Today's Date: 09/02/2022 OT Individual Time: 0786-7544 OT Individual Time Calculation (min): 45 min    Short Term Goals: Week 1:  OT Short Term Goal 1 (Week 1): Pt will be able to ambulate to bathroom with S using LRAD. OT Short Term Goal 2 (Week 1): Pt will complete LB dressing with supervision. OT Short Term Goal 3 (Week 1): Pt will complete shower with set up A/S. OT Short Term Goal 4 (Week 1): Pt will demonstrate independence with completing visual fixation exercises.  Skilled Therapeutic Interventions/Progress Updates:    Pt received in bed and agreeable to a shower.  Sat to EOB and reported that he was not having knee pain, extremely mild dizziness.  Dizziness did not exacerbate throughout the session.  Due to stiff knees, he could not reach to don socks easily. Suggested he try a sock aid in a future OT session.   Pt did well recalling techniques for safe sit to stand and then using RW for support to ambulate to sink to brush teeth and then to bathroom.  Pt stood to toilet and then transitioned to shower.  Due to long leg length, pt has to sit with side to shower head. Pt able to reach feet and used lateral leans for washing bottom.  Dried off and therapist assisted with donning non slip socks.  Ambulated to EOB to dress.  Close S with donning pants and CGA with standing as he pulled over hips.   Pt completed dressing.    Pt then started to work on [employer] chart for visual saccade training. His first few trials he needed quite a few cues about scanning L to R as he would read letters L to R then R to L then L to R and R to L and so on.  When he got the concept, he read letters correctly but turned head every time. Cued to repeat again without a head turn.    Pt encouraged to practice this.  Pt resting in bed with all needs met and alarm set.   Therapy Documentation Precautions:   Precautions Precautions: Fall Restrictions Weight Bearing Restrictions: No    Vital Signs: Therapy Vitals Temp: 98.1 F (36.7 C) Pulse Rate: 69 Resp: 16 BP: (Abnormal) 138/94 Patient Position (if appropriate): Lying Oxygen Therapy SpO2: 100 % Pain: Pain Assessment Pain Scale: 0-10 Pain Score: 0-No pain    Therapy/Group: Individual Therapy  Dajanae Brophy 09/02/2022, 8:01 AM

## 2022-09-02 NOTE — Discharge Instructions (Addendum)
Inpatient Rehab Discharge Instructions  Jared Cline Discharge date and time:  09/10/22  Activities/Precautions/ Functional Status: Activity: no lifting, driving, or strenuous exercise  till cleared by MD Diet: cardiac diet Limits sweets/starches.  Wound Care: keep wound clean and dry   Functional status:  ___ No restrictions     ___ Walk up steps independently _X__ 24/7 supervision/assistance   ___ Walk up steps with assistance ___ Intermittent supervision/assistance  ___ Bathe/dress independently ___ Walk with walker     _X__ Bathe/dress with assistance ___ Walk Independently    ___ Shower independently ___ Walk with assistance    ___ Shower with assistance _X__ No alcohol     ___ Return to work/school ________  COMMUNITY REFERRALS UPON DISCHARGE:    Home Health:   PT     OT                  Agency:  Phone:[facility] [phone]    Medical Equipment/Items Ordered: Rolling Walker, Bedside Commode and Tub Transfer Bench                                                 Agency/Supplier: [facility]  [phone]   Special Instructions:   STROKE/TIA DISCHARGE INSTRUCTIONS SMOKING Cigarette smoking nearly doubles your risk of having a stroke & is the single most alterable risk factor  If you smoke or have smoked in the last 12 months, you are advised to quit smoking for your health. Most of the excess cardiovascular risk related to smoking disappears within a year of stopping. Ask you doctor about anti-smoking medications [place] Quit Line: 1-800-QUIT NOW Free Smoking Cessation Classes [phone]  CHOLESTEROL Know your levels; limit fat & cholesterol in your diet  Lipid Panel     Component Value Date/Time   CHOL 149 08/25/2022 0530   CHOL 174 10/19/2015 1048   TRIG 78 08/25/2022 0530   HDL 47 08/25/2022 0530   HDL 48 10/19/2015 1048   CHOLHDL 3.2 08/25/2022 0530   VLDL 16 08/25/2022 0530   LDLCALC 86 08/25/2022 0530   LDLCALC 151 (H) 10/05/2021 1357     Many patients benefit  from treatment even if their cholesterol is at goal. Goal: Total Cholesterol (CHOL) less than 160 Goal:  Triglycerides (TRIG) less than 150 Goal:  HDL greater than 40 Goal:  LDL (LDLCALC) less than 100   BLOOD PRESSURE American Stroke Association blood pressure target is less that 120/80 mm/Hg  Your discharge blood pressure is:  BP: (!) 122/58 Monitor your blood pressure Limit your salt and alcohol intake Many individuals will require more than one medication for high blood pressure  DIABETES (A1c is a blood sugar average for last 3 months) Goal HGBA1c is under 7% (HBGA1c is blood sugar average for last 3 months)  Diabetes:     Lab Results  Component Value Date   HGBA1C 5.8 (H) 08/24/2022    Your HGBA1c can be lowered with medications, healthy diet, and exercise. Check your blood sugar as directed by your physician Call your physician if you experience unexplained or low blood sugars.  PHYSICAL ACTIVITY/REHABILITATION Goal is 30 minutes at least 4 days per week  Activity: No driving, Therapies: see above Return to work: N/A Activity decreases your risk of heart attack and stroke and makes your heart stronger.  It helps control your weight and blood pressure;  helps you relax and can improve your mood. Participate in a regular exercise program. Talk with your doctor about the best form of exercise for you (dancing, walking, swimming, cycling).  DIET/WEIGHT Goal is to maintain a healthy weight  Your discharge diet is:  Diet Order             Diet Heart Room service appropriate? Yes; Fluid consistency: Thin  Diet effective now                   liquids Your height is:  Height: 6' (182.9 cm) Your current weight is: Weight: 210 lbs Your Body Mass Index (BMI) is:  BMI (Calculated): 28.49 Following the type of diet specifically designed for you will help prevent another stroke. Your goal weight is:  184 lbs Your goal Body Mass Index (BMI) is 19-24. Healthy food habits can help  reduce 3 risk factors for stroke:  High cholesterol, hypertension, and excess weight.  RESOURCES Stroke/Support Group:  Call [phone]   STROKE EDUCATION PROVIDED/REVIEWED AND GIVEN TO PATIENT Stroke warning signs and symptoms How to activate emergency medical system (call 911). Medications prescribed at discharge. Need for follow-up after discharge. Personal risk factors for stroke. Pneumonia vaccine given:  Flu vaccine given:  My questions have been answered, the writing is legible, and I understand these instructions.  I will adhere to these goals & educational materials that have been provided to me after my discharge from the hospital.     My questions have been answered and I understand these instructions. I will adhere to these goals and the provided educational materials after my discharge from the hospital.  Patient/Caregiver Signature _______________________________ Date __________  Clinician Signature _______________________________________ Date __________  Please bring this form and your medication list with you to all your follow-up doctor's appointments.    ====================================================================================================  Information on my medicine - ELIQUIS (apixaban)  This medication education was reviewed with me or my healthcare representative as part of my discharge preparation.    Why was Eliquis prescribed for you? Eliquis was prescribed to treat blood clots that may have been found in the veins of your legs (deep vein thrombosis) or in your lungs (pulmonary embolism) and to reduce the risk of them occurring again.  What do You need to know about Eliquis ? Your dose is ONE 5 mg tablet taken TWICE daily.  Eliquis may be taken with or without food.   Try to take the dose about the same time in the morning and in the evening. If you have difficulty swallowing the tablet whole please discuss with your pharmacist how to  take the medication safely.  Take Eliquis exactly as prescribed and DO NOT stop taking Eliquis without talking to the doctor who prescribed the medication.  Stopping may increase your risk of developing a new blood clot.  Refill your prescription before you run out.  After discharge, you should have regular check-up appointments with your healthcare provider that is prescribing your Eliquis.    What do you do if you miss a dose? If a dose of ELIQUIS is not taken at the scheduled time, take it as soon as possible on the same day and twice-daily administration should be resumed. The dose should not be doubled to make up for a missed dose.  Important Safety Information A possible side effect of Eliquis is bleeding. You should call your healthcare provider right away if you experience any of the following: Bleeding from an injury or your nose that  does not stop. Unusual colored urine (red or dark brown) or unusual colored stools (red or black). Unusual bruising for unknown reasons. A serious fall or if you hit your head (even if there is no bleeding).  Some medicines may interact with Eliquis and might increase your risk of bleeding or clotting while on Eliquis. To help avoid this, consult your healthcare provider or pharmacist prior to using any new prescription or non-prescription medications, including herbals, vitamins, non-steroidal anti-inflammatory drugs (NSAIDs) and supplements.  This website has more information on Eliquis (apixaban): http://[email or web address].com/eliquis/home

## 2022-09-02 NOTE — Progress Notes (Signed)
Physical Therapy Session Note  Patient Details  Name: Jared Cline MRN: [id number] Date of Birth: April 14, 1946  Today's Date: 09/02/2022 PT Individual Time: 1000-1114 PT Individual Time Calculation (min): 74 min   Short Term Goals: Week 1:  PT Short Term Goal 1 (Week 1): Pt will complete bed mobility with supervision PT Short Term Goal 2 (Week 1): Pt will complete bed<>Chair transfers with supervision PT Short Term Goal 3 (Week 1): Pt will ambulate 142f with CGA and LRAD PT Short Term Goal 4 (Week 1): Pt will participate in functional outcome measure to address falls risk  Skilled Therapeutic Interventions/Progress Updates:      Pt resting in bed on arrival - awakens to voice and is in agreement to therapy session. He reports he didn't sleep well last night, which is why he feels tired.  Supine<>Sitting EOB without assist, HOB nearly flat. Able to scoot himself forward to EOB without assist. Donned shoes with setupA, difficulty sliding heel in and may do well using a long hand shoe horn.  Sit<>stand to RW with supervision with cues for forward weight shifting and for hand placement. Stand<>pivot transfer with CGA and RW from EOB to w/c - fair controlled sitting to w/c.   Transported to main rehab gym.   BP taken prior to mobility training: 137/64. BP after ~2061fof gait: 166/64  Sit<>Stand to RW from w/c with CGA - cues for forward weight shifting and hand placement - pt "walks" his feet backwards in 1/2 standing position to help improve BOS.   Gait training ~20059f [id number] hallways with RW and CGA - slow speed with decreased stride length. Cues for gaze stabilization and forward gaze. Gait distance limited by fatigue and pt c/o feeling "dizzy."   Instructed in stair training using 6inch steps and 2 hand rails. Pt completed while forward facing, step-to pattern. Completed x12 steps without rest break, progressing from CGA to supervision. No knee buckling or antalgia from  knees observed.   Standing there-ex in // bars with BUE support: -1x20 alternating high knees -1x10 hip abd bilaterally -1x10 hamstring curls bilaterally  Nustep x12 minutes at L6 resistance, using BUE/BLE to challenge cardiovascular endurance and activity tolerance. 435 steps total with cadence >35 steps/minute.   Returned to room and encouraged pt to stay sitting in w/c - pt in agreement. Safety belt alarm on, call bell in reach. All items within reach.    Therapy Documentation Precautions:  Precautions Precautions: Fall Restrictions Weight Bearing Restrictions: No General:    Therapy/Group: Individual Therapy  Sonnie Pawloski P Nandini Bogdanski PT 09/02/2022, 7:37 AM

## 2022-09-02 NOTE — Progress Notes (Signed)
Occupational Therapy Session Note  Patient Details  Name: Jared Cline MRN: [id number] Date of Birth: [birth year]  Today's Date: 09/02/2022 OT Individual Time: 1415-1530 OT Individual Time Calculation (min): 75 min    Short Term Goals: Week 1:  OT Short Term Goal 1 (Week 1): Pt will be able to ambulate to bathroom with S using LRAD. OT Short Term Goal 2 (Week 1): Pt will complete LB dressing with supervision. OT Short Term Goal 3 (Week 1): Pt will complete shower with set up A/S. OT Short Term Goal 4 (Week 1): Pt will demonstrate independence with completing visual fixation exercises.  Skilled Therapeutic Interventions/Progress Updates:     Pt received sitting up in wc in good spirits and receptive to skilled OT session. Pt reporting mild dizziness in sitting at beginning of session with symptoms monitored throughout session. Vitals in sitting BP 152/69 (97) HR 71 PSO2 100. Pt politely denying need for BADLs during session.   Pt inquiring about dizziness symptoms with education provided on impact and location of CVA. Pt educated on impact of changes in position when leaning forward and reaching towards the ground or moving from supine>sitting>standing. Pt verbalized importance of taking time and monitoring dizziness symptoms. OT reviewed visual scanning exercises from previous session to support carry over with pt demonstrating teach back as evidence of learning.  Per other treating OT, Pt reporting dizziness during AM session when bending over to donn socks. Pt educated during this session on use and purpose of sock aide with Pt receptive to education. Pt provided demonstration with practice following. Pt able to set up and donn socks using sock aide supervision.   Pt transported to therapy gym in wc total A for time management. Pt completed functional mobility in gym ambulating to<>from wc using RW with CGA single LOB noted. Stand>sit EOM using RW CGA with mod cues for technique. Pt noted to  required multimodal cues to weight shift forward and fully extend hips when coming to stand.   Pt completed transfer training and dynamic standing balance activity using RW. Pt educated on body mechanics/positioning, hand positioning, and anterior weight shifting for sit>stand with demonstration provided using RW. Pt able to complete sit>stands during functional reaching task/dynamic stand task with CGA and mod multimodal cues for anterior weight shift and hip extension.   Pt and OT discussed d/c plan and home set-up to problem solve safety concerns. Pt receptive to using tub bench and BSC upon d/c. Pt transported to ADL apartment bathroom for tub bench transfer education. Pt educated on technique with demonstration provided. Pt able to complete transfer <>tub bench ambulating using RW with CGA and mod cues for safety and technique. Transported back to room total A in wc. Pt stand step to bed and returned to supine CGA. Pt was left resting in bed with call bell in reach, bed alarm on, and all needs met.   Therapy Documentation Precautions:  Precautions Precautions: Fall Restrictions Weight Bearing Restrictions: No General:   Vital Signs: Therapy Vitals Temp: 97.6 F (36.4 C) Pulse Rate: 72 Resp: 17 BP: 121/64 Patient Position (if appropriate): Sitting Oxygen Therapy SpO2: 100 % O2 Device: Room Air Pain:   ADL: ADL Eating: Independent Grooming: Independent Upper Body Bathing: Setup Lower Body Bathing: Contact guard Upper Body Dressing: Setup Lower Body Dressing: Contact guard Toileting: Contact guard Toilet Transfer: Therapist, music Method: Counselling psychologist: Grab bars, Raised toilet seat (RW)   Therapy/Group: Individual Therapy  Janey Genta 09/02/2022, 2:40 PM

## 2022-09-02 NOTE — Progress Notes (Signed)
Patient ID: Jared Cline, male   DOB: 12/14/1945, 77 y.o.   MRN: [id number] Met with the patient to review current situation, rehab process, team conference and plan of care. Discussed knee pain; currently using voltaren gel with fair pain control. Set up for knee injection/aspiration in room. Re viewed medications, and secondary risks and dietary modification recommendations for prediabetes and CKD. Reviewed vitamin deficiencies and food sources for vitamin C, D and B12. Continue to follow along to address educational needs to facilitate preparation for discharge home. Jared Cline

## 2022-09-03 ENCOUNTER — Inpatient Hospital Stay (HOSPITAL_COMMUNITY): Payer: Medicare Other

## 2022-09-03 MED ORDER — SIMETHICONE 80 MG PO CHEW
80.0000 mg | CHEWABLE_TABLET | Freq: Four times a day (QID) | ORAL | Status: AC
  Administered 2022-09-03 – 2022-09-06 (×12): 80 mg via ORAL
  Filled 2022-09-03 (×12): qty 1

## 2022-09-03 MED ORDER — SCOPOLAMINE 1 MG/3DAYS TD PT72
1.0000 | MEDICATED_PATCH | TRANSDERMAL | Status: DC
  Administered 2022-09-03 – 2022-09-09 (×3): 1.5 mg via TRANSDERMAL
  Filled 2022-09-03 (×3): qty 1

## 2022-09-03 NOTE — Progress Notes (Signed)
PROGRESS NOTE   Subjective/Complaints:  Abd discomfort mild nausea, had good results with sorbitol yesterday   Review of systems negative for chest pain or shortness of breath  Objective:   No results found. Recent Labs    09/02/22 0627  WBC 6.6  HGB 12.3*  HCT 37.3*  PLT 425*    Recent Labs    09/02/22 0627  NA 132*  K 4.5  CL 100  CO2 23  GLUCOSE 99  BUN 23  CREATININE 1.45*  CALCIUM 9.2     Intake/Output Summary (Last 24 hours) at 09/03/2022 0756 Last data filed at 09/02/2022 2046 Gross per 24 hour  Intake --  Output 100 ml  Net -100 ml         Physical Exam: Vital Signs Blood pressure (!) 149/63, pulse 61, temperature 98.4 F (36.9 C), temperature source Oral, resp. rate 18, height 6' (1.829 m), weight 98.6 kg, SpO2 100 %.   General: No acute distress Mood and affect are appropriate Heart: Regular rate and rhythm no rubs murmurs or extra sounds Lungs: Clear to auscultation, breathing unlabored, no rales or wheezes Abdomen: Positive bowel sounds, soft nontender to palpation, nondistended Extremities: No clubbing, cyanosis, or edema Skin: No evidence of breakdown, no evidence of rash Neurologic: Cranial nerves II through XII intact, motor strength is 5/5 in bilateral deltoid, bicep, tricep, grip, hip flexor, knee extensors, ankle dorsiflexor and plantar flexor  Cerebellar exam normal finger to nose to finger as well as heel to shin in bilateral upper and lower extremities Musculoskeletal:RIght knee effusion noted suprapatellar bursa   Assessment/Plan: 1. Functional deficits which require 3+ hours per day of interdisciplinary therapy in a comprehensive inpatient rehab setting. Physiatrist is providing close team supervision and 24 hour management of active medical problems listed below. Physiatrist and rehab team continue to assess barriers to discharge/monitor patient progress toward functional  and medical goals  Care Tool:  Bathing    Body parts bathed by patient: Right arm, Left arm, Chest, Abdomen, Front perineal area, Buttocks, Right upper leg, Left upper leg, Face, Left lower leg, Right lower leg         Bathing assist Assist Level: Contact Guard/Touching assist     Upper Body Dressing/Undressing Upper body dressing   What is the patient wearing?: Pull over shirt    Upper body assist Assist Level: Set up assist    Lower Body Dressing/Undressing Lower body dressing      What is the patient wearing?: Underwear/pull up, Pants     Lower body assist Assist for lower body dressing: Contact Guard/Touching assist     Toileting Toileting    Toileting assist Assist for toileting: Contact Guard/Touching assist     Transfers Chair/bed transfer  Transfers assist     Chair/bed transfer assist level: Contact Guard/Touching assist     Locomotion Ambulation   Ambulation assist      Assist level: Contact Guard/Touching assist Assistive device: Walker-rolling Max distance: 100'   Walk 10 feet activity   Assist     Assist level: Contact Guard/Touching assist Assistive device: Walker-rolling   Walk 50 feet activity   Assist    Assist level: Contact Guard/Touching  assist Assistive device: Walker-rolling    Walk 150 feet activity   Assist Walk 150 feet activity did not occur: Safety/medical concerns (fatigue)         Walk 10 feet on uneven surface  activity   Assist Walk 10 feet on uneven surfaces activity did not occur: Safety/medical concerns         Wheelchair     Assist Is the patient using a wheelchair?: Yes Type of Wheelchair: Manual    Wheelchair assist level: Moderate Assistance - Patient 50 - 74% Max wheelchair distance: 80'    Wheelchair 50 feet with 2 turns activity    Assist        Assist Level: Moderate Assistance - Patient 50 - 74%   Wheelchair 150 feet activity     Assist      Assist  Level: Maximal Assistance - Patient 25 - 49%   Blood pressure (!) 149/63, pulse 61, temperature 98.4 F (36.9 C), temperature source Oral, resp. rate 18, height 6' (1.829 m), weight 98.6 kg, SpO2 100 %.  Medical Problem List and Plan: 1. 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 New diagnosis segmental PE/Antithrombotics: -DVT/anticoagulation:  Pharmaceutical: Eliquis- at least 58mo            -antiplatelet therapy: N/A (ASA after DOAC completed)  3. Pain Management: oxycodone prn for knee pain.              - Pre-medicate AM therapies to allow participation             - See management of R knee effusion below 4. Mood/Behavior/Sleep: LCSW to follow for evaluation and support.              --Trazodone prn for sleep.              -antipsychotic agents: N/A 5. Neuropsych/cognition: This patient is capable of making decisions on his own behalf. 6. Skin/Wound Care: Routine pressure relief measures.  7. Fluids/Electrolytes/Nutrition: Monitor I/O. Check CMET in am.  8. HTN: Monitor BP TID- continue     08/29/2022    9:53 PM 08/29/2022    8:39 PM 08/29/2022    4:27 PM  Vitals with BMI  Systolic [id number] Diastolic 70 72 70  Pulse   65 59   mild systolic elevation  On  clonidine, avapro, hydralazine, spironolactone 9. CKD 3A: Baseline SCr 1.3-1.4. Reports mild dysuria since admission. Encourage fluid intake.  BUN/Cr improved 1/12 vs 1/11 10. OSA: Continue CPAP 11. Prostate cancer s/p prostatectomy: 12. Right knee OA/effusion: Used to get his knee injected every 6 months and missed an appt last week. --Add voltaren gel QID with aquatherapy prn.  --Oxycodone prn pain--may need premedication.  --consult ortho for aspiration.  Pending results, would consider steroid injection (was due for OP bilateral injections prior to admission) 13. Hyponatremia: Borderline low. Continue to monitor sodium levels. Stable valus 1/12 vs 1/11 14. Acute blood loss anemia: Recheck in am 15. Constipation: likely cause of abd discomfort, mild nausea no vomiting  --results with sorbitol but  still has abd discomfort will check KUB      LOS: 5 days A FACE TO FACE EVALUATION WAS PERFORMED  Charlett Blake 09/03/2022, 7:56 AM

## 2022-09-03 NOTE — Progress Notes (Signed)
Physical Therapy Session Note  Patient Details  Name: Jared Cline MRN: [id number] Date of Birth: 01/22/1946  Today's Date: 09/03/2022 PT Individual Time: 0800-0856 PT Individual Time Calculation (min): 56 min   Short Term Goals: Week 1:  PT Short Term Goal 1 (Week 1): Pt will complete bed mobility with supervision PT Short Term Goal 2 (Week 1): Pt will complete bed<>Chair transfers with supervision PT Short Term Goal 3 (Week 1): Pt will ambulate 15f with CGA and LRAD PT Short Term Goal 4 (Week 1): Pt will participate in functional outcome measure to address falls risk  Skilled Therapeutic Interventions/Progress Updates:      Pt lying in bed to start - awake and agreeable to therapy session. Reports not feeling well, having a bad "stomach ache." Contributes what he ate last night to his discomfort, denies constipation as he's had a recent BM.   MD entering room for morning rounding - discussing the above concerns. Plan for possible abdominal XR.   Pt requesting to use restroom prior to leaving room. Supine<>sitting EOB with supervision with HOB slightly elevated. Sit<>Stand from raised EOB to RW with supervision. Ambulates with close SBA and RW from his bed to toilet, ~144f with cues for safety approach and managing threshold at bathroom door. Pt able to lower pants without assist. Continent of bladder while sitting on toilet, no BM. Returned to standing with SBA. Discussed recommendation for installing grab bars in bathroom at home - pt in agreement.   Pt completed hand hygiene, face washing, and oral care at the sink in standing. Pt relying heavily on UE support or would lean his hips against the sink for stability. Able to complete all tasks without assist but supervision for standing balance.   Transported in w/c to main rehab gym.  Gait training 2001fith supervision and RW - no knee buckling or LOB observed. Slow gait speed while dual tasking with conversation - able to complete  head turns without LOB.   Balance training in // bars - feet apart on firm ground. - able to sustain balance for >1 minute without  LOB - fee together on firm ground - able sustain for ~10-15 seconds  - feet apart on foam air-ex - able to sustain for >30 seconds without LOB - feet together on foam air-ex - able to sustain ~5-10 seconds without LOB  Returned to room and patient remained seated in w/c with call bell in reach and all needs met. Pt made aware of upcoming therapy schedule.    Therapy Documentation Precautions:  Precautions Precautions: Fall Restrictions Weight Bearing Restrictions: No General:    Therapy/Group: Individual Therapy  Money Mckeithan P Ezel Vallone PT 09/03/2022, 7:30 AM

## 2022-09-03 NOTE — Progress Notes (Signed)
Physical Therapy Session Note  Patient Details  Name: Jared Cline MRN: [id number] Date of Birth: 03/11/1946  Today's Date: 09/03/2022 and Today's Date: 09/03/2022 PT Missed Time: 37 Minutes and 30 minutes Missed Time Reason: Patient ill (Comment) (vertigo symptoms)  Short Term Goals: Week 1:  PT Short Term Goal 1 (Week 1): Pt will complete bed mobility with supervision PT Short Term Goal 2 (Week 1): Pt will complete bed<>Chair transfers with supervision PT Short Term Goal 3 (Week 1): Pt will ambulate 114f with CGA and LRAD PT Short Term Goal 4 (Week 1): Pt will participate in functional outcome measure to address falls risk  Skilled Therapeutic Interventions/Progress Updates:    Session 1: Pt received supine in bed stating he is "not doing too good today." Reports "I've been drunk feeling all morning" (making a hand gesture of back and forth movement). Pt states these symptoms were present prior to earlier PT session and that he actually felt better during earlier PT, but then upon returning to room while sitting in w/c his symptoms continued to worsen. Vitals in supine: BP 114/64 (MAP 79), HR 55bpm Pt politely requests to hold therapy at this time stating he attempted to participate in OT session but was very limited and unsuccessful. Pt denies any needs at this time and left supine in bed with needs in reach and bed alarm on. Missed 30 minutes of skilled physical therapy.  Session 2: Pt received supine in bed still reporting "not feeling too good" and that he is still feeling "drunk" (making same back and forth hand gesture movement as earlier). Notice pt's lunch tray sitting untouched, next to him on tray table. Pt again politely declines participation in therapy session stating he is upset he does not feel up to doing therapy. Notified nurse and PA of pt's symptoms limiting participation in therapy. Pt left supine in bed with needs in reach and bed alarm on. Missed 30 minutes of skilled physical  therapy.  Therapy Documentation Precautions:  Precautions Precautions: Fall Restrictions Weight Bearing Restrictions: No   Pain:  Session 1: No reports of pain throughout session.  Session 2: No reports of pain throughout session.    Therapy/Group: Individual Therapy  CTawana Scale, PT, DPT, NCS, CSRS 09/03/2022, 8:03 AM

## 2022-09-03 NOTE — Progress Notes (Signed)
Occupational Therapy Session Note  Patient Details  Name: Jared Cline MRN: [id number] Date of Birth: 1946-07-28  Today's Date: 09/03/2022 OT Individual Time: 1324-4010 OT Individual Time Calculation (min): 45 min   Short Term Goals: Week 1:  OT Short Term Goal 1 (Week 1): Pt will be able to ambulate to bathroom with S using LRAD. OT Short Term Goal 2 (Week 1): Pt will complete LB dressing with supervision. OT Short Term Goal 3 (Week 1): Pt will complete shower with set up A/S. OT Short Term Goal 4 (Week 1): Pt will demonstrate independence with completing visual fixation exercises.  Skilled Therapeutic Interventions/Progress Updates:     AM Session: Pt received sitting up in wc in good spirits and receptive to skilled OT session. Pt reporting mild pain in abdomin and in R knee- received medications from RN prior to session, offered repositioning and rest breaks to decrease pain. PA, Pam, in/out during session to discuss Pt pain. Recommended ACE wrap to provide compression to knee with OT wrapping knee during session to alleviate pain.   Pt receptive to walking to therapy gym for functional mobility training. Pt sit>stand CGA mod cues for technique. Pt ambulated to gym using RW CGA with mod cues to recall staying within RW. Pt noted to have preference to lean to R when ambulating- able to self correct with mod VB and tactile cues. Pt fatigued during task and reporting dizziness with seated break provided. Pt re-educated on visual fixation to decrease dizziness symptoms. Pt required max verbal cues to fixate eyes and not move head. Dizziness symptoms reduced following fixation.   Pt receptive to propelling wc rest of the way to gym for endurance training. Pt able to propel chair to therapy gym with mod cues for technique.   Pt attempted to complete visual scanning tasks standing on compliant surface at elevated table. Pt completed sit>stand using RW and reported increase in dizziness. OT guided Pt  through visual fixation, but Pt unable to fixate eyes on target despite max cues. Pt requested to sit. Remained of session focused on providing Pt education for visual scanning, fixation, and impact of CVA. Pt transported back to room total A in wc. Pt stand step to bed CGA. Pt was left resting in bed with call bell in reach, bed alarm on, and all needs met.    PM Session: 2725- Attempted to see Pt with Pt received sitting semi reclined in bed. Pt reporting nausea and dizziness, politely refusing OT session this evening. Missed 45 minutes of skilled OT treatment.     Therapy Documentation Precautions:  Precautions Precautions: Fall Restrictions Weight Bearing Restrictions: No General: General PT Missed Treatment Reason: Patient ill (Comment) (vertigo symptoms) Vital Signs: Therapy Vitals Pulse Rate: (Abnormal) 55 BP: 114/64 Patient Position (if appropriate): Lying Pain: Pain Assessment Pain Scale: 0-10 Pain Score: 5  Pain Location: Abdomen Pain Orientation: Left;Upper (stomach") Pain Intervention(s): Medication (See eMAR) ADL: ADL Eating: Independent Grooming: Independent Upper Body Bathing: Setup Lower Body Bathing: Contact guard Upper Body Dressing: Setup Lower Body Dressing: Contact guard Toileting: Contact guard Toilet Transfer: Therapist, music Method: Counselling psychologist: Grab bars, Raised toilet seat (RW)   Therapy/Group: Individual Therapy  Janey Genta 09/03/2022, 12:53 PM

## 2022-09-04 DIAGNOSIS — R109 Unspecified abdominal pain: Secondary | ICD-10-CM

## 2022-09-04 DIAGNOSIS — E871 Hypo-osmolality and hyponatremia: Secondary | ICD-10-CM

## 2022-09-04 DIAGNOSIS — I10 Essential (primary) hypertension: Secondary | ICD-10-CM

## 2022-09-04 NOTE — Progress Notes (Signed)
Patient ID: Jared Cline, male   DOB: [birth year], 77 y.o.   MRN: [id number]  Team Conference Report to Patient/Family  Team Conference discussion was reviewed with the patient and caregiver, including goals, any changes in plan of care and target discharge date.  Patient and caregiver express understanding and are in agreement.  The patient has a target discharge date of 09/10/22.  SW met with patient and provided team conference updates. Sw made attempt to call spouse, left detailed VM. Patient making good progress. Requesting RW, TTB and BSC. Patient prefers [place] due to his spouse babysitting. No additional questions or concerns.   Dyanne Iha 09/04/2022, 1:56 PM

## 2022-09-04 NOTE — Patient Care Conference (Cosign Needed)
Inpatient RehabilitationTeam Conference and Plan of Care Update Date: 09/04/2022   Time: 2:50 PM    Patient Name: Jared Cline      Medical Record Number: [id number]  Date of Birth: [birth year] Sex: Male         Room/Bed: 4W07C/4W07C-01 Payor Info: Payor: [employer] / Plan: [employer] MEDICARE / Product Type: *No Product type* /    Admit Date/Time:  08/29/2022  3:15 PM  Primary Diagnosis:  Cerebellar stroke [facility])  Hospital Problems: Principal Problem:   Cerebellar stroke [facility])    Expected Discharge Date: Expected Discharge Date: 09/10/22  Team Members Present: Physician leading conference: Dr. Leeroy Cha Social Worker Present: Erlene Quan, BSW Nurse Present: Dorien Chihuahua, RN PT Present: Page Spiro, PT OT Present: Other (comment) Lowella Fairy, OT) SLP Present: Sherren Kerns, SLP PPS Coordinator present : Gunnar Fusi, SLP     Current Status/Progress Goal Weekly Team Focus  Bowel/Bladder   Continent of B/B   Remain continent   Assist with toileting as needed    Swallow/Nutrition/ Hydration               ADL's   set-up UB dressing, CGA LB dressing, toilet/shower transfer CGA, supervision UB bathing, CGA LB bathing; limited by vertigo symptoms in therpy, however very motivated to participate   Mod I   BADL retraining, functional transfer training, d/c planning, visual vixation, Pt education, saety awareness, functional mobility    Mobility   supervision bed mobility, CGA sit<>stand and stand pivot transfers using RW, gait up to 258f using RW with CGA, 12 steps using B HRs with CGA   mod-I transfers and ambulation using LRAD, CGA car transfers  activity tolerance, pain management, transfer training, gait training using LRAD, stair navigation training, pt/family education, DME training, dynamic standing balance    Communication                Safety/Cognition/ Behavioral Observations               Pain   Knee pain, Scheduled  volataren gel and prn medication available.   Pain </=3/10   Monitor effectiveness of medication and reevaluate as needed    Skin   Skin intact   Maintain skin integrity  Assess Qshift and prn      [name] Spouse only able to provide supervision. All 3 children anticipate assisting with care needs.   Team Discussion: Patient knee pain is better with Voltaren gel; awaiting steroid injection 09/04/22.   Patient on target to meet rehab goals: yes, currently needs set up for upper body care and CGA for lower body care , dressing and toileting. Vertigo improved with scopolamine patch. Completes sit - stand with CGA and able to ambulate up to 200' using a RW.   *See Care Plan and progress notes for long and short-term goals.   Revisions to Treatment Plan:  SLP eval:at baseline; SLP signed off   Teaching Needs: Safety, medications, dietary modifications, transfers, toileting, etc.  Current Barriers to Discharge: Decreased caregiver support  Possible Resolutions to Barriers: Family Education OP follow up services DME: TTB, BSC, RW     Medical Summary Current Status: cerebellar stroke, abdominal discomfort secondary to constipation, elevated systolic blood pressure, segmental pulmonary embolism, right knee pain  Barriers to Discharge: Medical stability  Barriers to Discharge Comments: cerebellar stroke, abdominal discomfort secondary to constipation, elevated systolic blood pressure, segmental pulmonary embolism, right knee pain Possible  Resolutions to [employer]: scopolamine patch started for vertigo symptoms, continue sorbitol prn, continue clonidine/avapro/hydralazine/spironolactone, continue Eliquis, planning for steroid injection today   Continued Need for Acute Rehabilitation Level of Care: The patient requires daily medical management by a physician with specialized training in physical  medicine and rehabilitation for the following reasons: Direction of a multidisciplinary physical rehabilitation program to maximize functional independence : Yes Medical management of patient stability for increased activity during participation in an intensive rehabilitation regime.: Yes Analysis of laboratory values and/or radiology reports with any subsequent need for medication adjustment and/or medical intervention. : Yes   I attest that I was present, lead the team conference, and concur with the assessment and plan of the team.   Dorien Chihuahua B 09/04/2022, 2:50 PM

## 2022-09-04 NOTE — Progress Notes (Signed)
Patient ID: Jared Cline, male   DOB: [birth year], 77 y.o.   MRN: [id number]  BSC, TTB and RW ordered through [employer].

## 2022-09-04 NOTE — Progress Notes (Signed)
Physical Therapy Session Note  Patient Details  Name: Jared Cline MRN: [id number] Date of Birth: 14-Aug-1946  Today's Date: 09/04/2022 PT Individual Time: 1107-1205 and 4034-7425 PT Individual Time Calculation (min): 58 min and 41 min  Short Term Goals: Week 1:  PT Short Term Goal 1 (Week 1): Pt will complete bed mobility with supervision PT Short Term Goal 2 (Week 1): Pt will complete bed<>Chair transfers with supervision PT Short Term Goal 3 (Week 1): Pt will ambulate 139f with CGA and LRAD PT Short Term Goal 4 (Week 1): Pt will participate in functional outcome measure to address falls risk  Skilled Therapeutic Interventions/Progress Updates:    Session 1: Pt received sitting in w/c talking on phone and agreeable to therapy session. Sit>stand w/c>RW with pt continuing to rely heavily on pushing up with both hands from w/c armrests lifting his hips this way and then walking his feet back underneath him as he rises (anticipate this may be associated with low seat height and chronic knee pain). Gait training ~1560fto main therapy gym using RW with CGA and therapist bringing w/c in event of fatigue but not used - slow gait speed, narrow BOS with slight scissoring, forward trunk flexed posture with R lateral trunk lean, decreased B LE step lengths (R smaller than L), and lack of knee flexion during swing phase.  Rates dizziness as 1/10 after walking to gym.  Repeated sit<>stand from significantly elevated EOM focusing on improved biomechanics/sequencing of muscle activation and increased powering up through B LEs rather than compensating by pushing up with UEs -x10reps - R LE stays slightly externally rotated and lacks full knee extension ROM when rising to stand, but may be baseline due to knee OA - lowered mat to require slight UE support to retrain how to use UE support in conjunction with maintaining improved biomechanics and not feeding into poor habits x7 reps - close supervision/CGA  provided throughout along with max verbal/visual cuing - when coming to stand pt able to maintain static standing balance with close supervision/CGA for at least 3 seconds each time  Static standing balance with eyes close 3x 3-10seconds with pt hesitant to do this at first requiring min assist due to posterior LOB progressed to CGA - rates dizziness as 2.5/10 with eyes closed. Educated pt on balance systems.   Dynamic gait training and hip strengthening in // bars performing side stepping down/back x2 reps then donned level 3 theraband resistance and performed additional 2 laps with UE support on bar providing min support and supervision for safety - cuing to avoid excessive R hip external rotation and avoid compensation with hip flexors as well as to activate glutes and maintain upright posture.  Gait training back to his room using RW with CGA and same gait deviations as noted above. Pt left seated in w/c with needs in reach, seat belt alarm on, and NT arriving to set-up pt's meal tray.   Session 2: Pt received seated in w/c and agreeable to therapy session despite reporting some fatigue/drowsiness.  Transported to/from gym in w/c for time management and energy conservation.  Sit>stands w/c>RW with continued cuing to reinforce earlier NMR on improved transfer biomechanics - scoot hips forward, feet back underneath BOS, wider BOS, and increased anterior trunk hinge with forward then upward movement - pt improves but continues to require some support from UEs to come to standing from this seat height due to pt's height and chronic R knee pain.  Gait training ~808fsing RW with  CGA and pt continuing to demo decreased gait speed, narrow BOS, decreased B LE step lengths, and slight forward trunk flexion but pt reports his LEs "feel as weak as a kitten." Reporting his LEs are fatigued from the increased activity today.   Transitioned to performing the following oculomotor exercises for habituation  starting in sitting then progressed to standing with B UE support on RW:  - adaptation exercise X1 and X2 in horizontal then vertical then diagonal directions Pt denies any symptoms of dizziness or any other symptoms from these exercises and pt's eyes are aligned and moving together fluidly during the exercises.  Transported back to his room and pt requesting to return to bed. Short distance ~8f ambulatory transfer w/c>EOB using RW with CGA. Sit>supine with supervision. Pt left supine in bed with needs in reach and bed alarm on.   Therapy Documentation Precautions:  Precautions Precautions: Fall Restrictions Weight Bearing Restrictions: No   Pain:   Session 1: States "knees feel good" until end of session when pt stating his R knee is "letting [him] me know it is there." Pt requesting Voltaren gelt at end of session - nurse notified to administer.  Session 2: No complaints of pain during session only feeling weak in his LEs from increased activity.    Therapy/Group: Individual Therapy  CTawana Scale, PT, DPT, NCS, CSRS 09/04/2022, 7:59 AM

## 2022-09-04 NOTE — Progress Notes (Signed)
Pt placed on home CPAP, resting comfortably at this time.

## 2022-09-04 NOTE — Progress Notes (Signed)
Occupational Therapy Session Note  Patient Details  Name: Jared Cline MRN: [id number] Date of Birth: [birth year]  Today's Date: 09/04/2022 OT Individual Time: 2694-8546 OT Individual Time Calculation (min): 75 min  OT Individual Time: 1300-1330 OT Individual Time Calculation (min): 30 min   Short Term Goals: Week 1:  OT Short Term Goal 1 (Week 1): Pt will be able to ambulate to bathroom with S using LRAD. OT Short Term Goal 2 (Week 1): Pt will complete LB dressing with supervision. OT Short Term Goal 3 (Week 1): Pt will complete shower with set up A/S. OT Short Term Goal 4 (Week 1): Pt will demonstrate independence with completing visual fixation exercises.  Skilled Therapeutic Interventions/Progress Updates:     AM Session: Pt received semi-reclined in bed presenting to be in good spirits 0/10 pain and receptive to skilled OT session. Pt reporting he slept well and is not experiencing dizziness so far today. Focus this session BADL retraining and visual fixation exercises to increase safety and decrease vertigo symptoms upon d/c.   Pt transitioned from semi reclined to EOB with HOB slightly elevated with supervision. Sit>stand using RW CGA with min cues for technique and hand placement. Ambulated to bathroom using RW and transferred to tub bench CGA with mod cues for technique. Pt and OT discussed bathroom safety and vertigo prevention with educated on long handled sponge and body positioning provided. Pt completed U/LB bathing seated on tub bench leaning R/L to clean posterior peri-area and long-handled sponge to wash back/feet. No vertigo symptoms reported during task. Transfer off tub bench using RW CGA with min cues for safety. Ambulated to EOB RW CGA. Pt donned shirt sitting EOB supervision and pants CGA when leaning forward to weave legs into pants and standing using RW to bring pants to waist.   Pt completed dynamic standing grooming/hygiene tasks standing at sink with RW CGA with single  UE supported on sink/RW at all times. Pt required mod tactile/VB cues to weight shift towards R to center his balance equally on BLEs. Pt reported dizziness following activities rated 4/10 with seated rest break provided. Pt instructed to fixate eyes on non-moving target with symptoms decreasing to 1.5/10 within <2 minutes.   Pt educated on visual fixation and ocular ROM exercises seated EOB. Pt completed 2X10 head turns R/L with eyes fixated on target and 2x10 reps of visual tracking using thumb as target moving clockwise then counter clockwise. Pt required mod cues to isolate eye movements from head and for technique. Pt reporting no increase in dizziness during tasks. Very mild nystagmus noted at end range of ocular ROM.  Pt requesting to stay up in wc at end of session. Stand step using Rw CGA. Pt was left resting in wc with call bell in reach, seat belt alarm on, and all needs met.  PM Session: Pt received sitting up in wc finishing lunch in good spirits and receptive to skilled OT session. Pt reporting 0/10 pain and dizziness at beginning of session and requesting to walk to therapy gym.   Pt sit>stand using RW CGA with min cues for body mechanics and safety. Pt ambulated to far therapy gym using RW with 2 standing rest breaks provided. Pt noted to lean towards R with mod tactile and VB cues required to center balance and for trunk position.   Pt completed BITs dual tasking activity seated in wc to work on visual pursuits and eye isolation. Pt completed visual pursuit-rotator/sequence with 58.82% in 9 min 33 sec reaction  time 19.09 sec. Pt required mod cues to isolate eye from head movements during task. Pt reporting no dizziness during task. Pt required mod cues to recall previous number/letter in sequence. Pt transported back to room total A in wc and left resting in wc with call bell in reach, seat belt alarm on, and all need met.   Therapy Documentation Precautions:  Precautions Precautions:  Fall Restrictions Weight Bearing Restrictions: No General:   Vital Signs: Therapy Vitals Temp: 97.8 F (36.6 C) Pulse Rate: 64 Resp: 18 BP: 131/68 Patient Position (if appropriate): Lying Oxygen Therapy SpO2: 100 % O2 Device: Room Air Pain: 0/10   ADL: ADL Eating: Independent Grooming: Independent Upper Body Bathing: Setup Lower Body Bathing: Contact guard Upper Body Dressing: Setup Lower Body Dressing: Contact guard Toileting: Contact guard Toilet Transfer: Therapist, music Method: Counselling psychologist: Grab bars, Raised toilet seat (RW)   Therapy/Group: Individual Therapy  Janey Genta 09/04/2022, 8:10 AM

## 2022-09-04 NOTE — Progress Notes (Signed)
PROGRESS NOTE   Subjective/Complaints:  Reports he is doing well overall. He asked me to hand him some lotion to prevent dry skin. No additional concerns.   Review of systems negative for chest pain or shortness of breath, cough, rash  Objective:   DG Abd 1 View  Result Date: 09/03/2022 CLINICAL DATA:  Abdominal pain. EXAM: ABDOMEN - 1 VIEW COMPARISON:  None Available. FINDINGS: Visualized bowel gas pattern is nonobstructive. No evidence of free intraperitoneal air. No evidence of soft tissue mass or abnormal fluid collection. No evidence of renal or ureteral calculi. Lung bases appear clear. IMPRESSION: No acute findings.  Grossly nonobstructive bowel gas pattern. Electronically Signed   By: Franki Cabot M.D.   On: 09/03/2022 09:31   Recent Labs    09/02/22 0627  WBC 6.6  HGB 12.3*  HCT 37.3*  PLT 425*    Recent Labs    09/02/22 0627  NA 132*  K 4.5  CL 100  CO2 23  GLUCOSE 99  BUN 23  CREATININE 1.45*  CALCIUM 9.2     Intake/Output Summary (Last 24 hours) at 09/04/2022 0828 Last data filed at 09/04/2022 0824 Gross per 24 hour  Intake 471 ml  Output 400 ml  Net 71 ml         Physical Exam: Vital Signs Blood pressure 131/68, pulse 64, temperature 97.8 F (36.6 C), resp. rate 18, height 6' (1.829 m), weight 98.6 kg, SpO2 100 %.   General: No acute distress Mood and affect are appropriate Heart: Regular rate and rhythm no rubs murmurs or extra sounds Lungs: Clear to auscultation, breathing unlabored, no rales or wheezes, good air movement Abdomen: normoactive BS, soft nontender to palpation, nondistended Extremities: No clubbing, cyanosis, or edema Skin: No evidence of breakdown, no evidence of rash Neurologic: Cranial nerves II through XII intact, motor strength is 5/5 in bilateral deltoid, bicep, tricep, grip, hip flexor, knee extensors, ankle dorsiflexor and plantar flexor  Cerebellar exam normal  finger to nose to finger as well as heel to shin in bilateral upper and lower extremities Musculoskeletal:RIght knee effusion noted suprapatellar bursa   Assessment/Plan: 1. Functional deficits which require 3+ hours per day of interdisciplinary therapy in a comprehensive inpatient rehab setting. Physiatrist is providing close team supervision and 24 hour management of active medical problems listed below. Physiatrist and rehab team continue to assess barriers to discharge/monitor patient progress toward functional and medical goals  Care Tool:  Bathing    Body parts bathed by patient: Right arm, Left arm, Chest, Abdomen, Front perineal area, Buttocks, Right upper leg, Left upper leg, Face, Left lower leg, Right lower leg         Bathing assist Assist Level: Contact Guard/Touching assist     Upper Body Dressing/Undressing Upper body dressing   What is the patient wearing?: Pull over shirt    Upper body assist Assist Level: Set up assist    Lower Body Dressing/Undressing Lower body dressing      What is the patient wearing?: Underwear/pull up, Pants     Lower body assist Assist for lower body dressing: Contact Guard/Touching assist     Toileting Toileting    Toileting  assist Assist for toileting: Contact Guard/Touching assist     Transfers Chair/bed transfer  Transfers assist     Chair/bed transfer assist level: Contact Guard/Touching assist     Locomotion Ambulation   Ambulation assist      Assist level: Contact Guard/Touching assist Assistive device: Walker-rolling Max distance: 100'   Walk 10 feet activity   Assist     Assist level: Contact Guard/Touching assist Assistive device: Walker-rolling   Walk 50 feet activity   Assist    Assist level: Contact Guard/Touching assist Assistive device: Walker-rolling    Walk 150 feet activity   Assist Walk 150 feet activity did not occur: Safety/medical concerns (fatigue)         Walk 10  feet on uneven surface  activity   Assist Walk 10 feet on uneven surfaces activity did not occur: Safety/medical concerns         Wheelchair     Assist Is the patient using a wheelchair?: Yes Type of Wheelchair: Manual    Wheelchair assist level: Moderate Assistance - Patient 50 - 74% Max wheelchair distance: 92'    Wheelchair 50 feet with 2 turns activity    Assist        Assist Level: Moderate Assistance - Patient 50 - 74%   Wheelchair 150 feet activity     Assist      Assist Level: Maximal Assistance - Patient 25 - 49%   Blood pressure 131/68, pulse 64, temperature 97.8 F (36.6 C), resp. rate 18, height 6' (1.829 m), weight 98.6 kg, SpO2 100 %.  Medical Problem List and Plan: 1. Functional deficits secondary to R ponto-cerebellar CVA             -patient may  shower             -ELOS/Goals: 10-14 days, supervision PT/OT, Ind SLP, discussed with PT, pt likely will be ready for d/c in ~1wk             - Primary barriers: balance and coordination deficits, R knee pain, 5 STE home  -Team conference today please see physician documentation under team conference tab, met with team  to discuss problems,progress, and goals. Formulized individual treatment plan based on medical history, underlying problem and comorbidities.   2. New diagnosis segmental PE/Antithrombotics: -DVT/anticoagulation:  Pharmaceutical: Eliquis- at least 9mo            -antiplatelet therapy: N/A (ASA after DOAC completed)  3. Pain Management: oxycodone prn for knee pain.              - Pre-medicate AM therapies to allow participation             - See management of R knee effusion below 4. Mood/Behavior/Sleep: LCSW to follow for evaluation and support.              --Trazodone prn for sleep.              -antipsychotic agents: N/A 5. Neuropsych/cognition: This patient is capable of making decisions on his own behalf. 6. Skin/Wound Care: Routine pressure relief measures.  7.  Fluids/Electrolytes/Nutrition: Monitor I/O. Check CMET in am.  8. HTN: Monitor BP TID- continue     08/29/2022    9:53 PM 08/29/2022    8:39 PM 08/29/2022    4:27 PM  Vitals with BMI  Systolic [id number] Diastolic 70 72 70  Pulse   65 59   mild systolic elevation  On  clonidine,  avapro, hydralazine, spironolactone -1/17 fair control continue to monitor 9. CKD 3A: Baseline SCr 1.3-1.4. Reports mild dysuria since admission. Encourage fluid intake.  BUN/Cr improved 1/12 vs 1/11 10. OSA: Continue CPAP 11. Prostate cancer s/p prostatectomy: 12. Right knee OA/effusion: Used to get his knee injected every 6 months and missed an appt last week. --Add voltaren gel QID with aquatherapy prn.  --Oxycodone prn pain--may need premedication.  --consult ortho for aspiration. Pending results, would consider steroid injection (was due for OP bilateral injections prior to admission) 13. Hyponatremia: Borderline low. Continue to monitor sodium levels. Stable valus 1/12 vs 1/11  1/17 Na stable 132 14. Acute blood loss anemia: Recheck in am  1/17 HGB stable at 12.3 15. Constipation: likely cause of abd discomfort, mild nausea no vomiting  --results with sorbitol but still has abd discomfort will check KUB -1/17 abdominal xray negative, simethicone and scopolamine started yesterday      LOS: 6 days A FACE TO FACE EVALUATION WAS PERFORMED  Jennye Boroughs 09/04/2022, 8:28 AM

## 2022-09-05 DIAGNOSIS — K5900 Constipation, unspecified: Secondary | ICD-10-CM

## 2022-09-05 MED ORDER — SORBITOL 70 % SOLN
60.0000 mL | Freq: Once | Status: AC
  Administered 2022-09-05: 60 mL via ORAL
  Filled 2022-09-05: qty 60

## 2022-09-05 NOTE — Progress Notes (Signed)
Physical Therapy Session Note  Patient Details  Name: Jared Cline MRN: [id number] Date of Birth: Jul 05, 1946  Today's Date: 09/05/2022 PT Individual Time: 7619-5093 PT Individual Time Calculation (min): 75 min   Short Term Goals: Week 1:  PT Short Term Goal 1 (Week 1): Pt will complete bed mobility with supervision PT Short Term Goal 2 (Week 1): Pt will complete bed<>Chair transfers with supervision PT Short Term Goal 3 (Week 1): Pt will ambulate 140f with CGA and LRAD PT Short Term Goal 4 (Week 1): Pt will participate in functional outcome measure to address falls risk  Skilled Therapeutic Interventions/Progress Updates:    The pt was received sitting at EOB and agreeable to therapy. Donned pts shoes sitting at EOB with min assist. Pt completed 3/3 toileting tasks with CGA provided.  Gait training ~ 1561fto therapy gym with RW and CGA with w/c follow in case of pt fatigue. Pt exhibited a narrow BOS, slow gait and required verbal cueing to flex B knees to allow for foot clearance during the swing phase. Gait ~15 ft in Maxi sky was used to assist in gait training w/o the RW. Forward and backward gait was done. Pts step length decreased and trunk slightly flexed in comparison to using RW. Backward walking was not tolerated w/ c/o R knee pain. RW was returned to aide in ease of movement. Side stepping done in Maxi sky, verbal cueing to point toes forward and engage glutes to avoid trunk flexion. CGA provided for pt reassurance.   Dynamic stepping balance using Maxi Sky and stepping forward and holding for 30 secs. Dycem used for visual cueing. Trunk shift to the right observed. Attempted side stepping and hold to the right, however, pt did not tolerate due to R knee discomfort.   Supine bridging w/ ball squeeze 3x5 reps - ball used to aid in the pt keeping knees from falling laterally. R more so than L. Pt struggled with keeping hold of the ball. SPT stabilizing feet w/ min A. Counting was done to  reinforce breathing mechanics. C/o dizziness post exercises when sitting at EOB. Subsided after ~1 minute.   Stand pivot to wc and back to bed using RW, w/ CGA verbal cueing to bring feet back to help with force production when coming to stand.  Pt returned to room in wc and left supine in bed with HOB elevated ready for dinner, bed alarm on, and call bell in reach.  Therapy Documentation Precautions:  Precautions Precautions: Fall Restrictions Weight Bearing Restrictions: No Pain: R knee pain with dynamic stepping balance and bwd walking on Maxi Sky   Therapy/Group: Individual Therapy  Kristal Perl 09/05/2022, 6:02 PM

## 2022-09-05 NOTE — Progress Notes (Signed)
Patient ID: Jared Cline, male   DOB: 02-27-1946, 77 y.o.   MRN: [id number]  SW spoke with patient spouse and arranged family education Monday 1/22 10a-12p.

## 2022-09-05 NOTE — Progress Notes (Addendum)
Patient ID: Jared Cline, male   DOB: 1945/08/28, 77 y.o.   MRN: [id number]  [facility] referral sent to [facility] for FU.  Patient approved by [facility] Orders faxed to [facility].

## 2022-09-05 NOTE — Progress Notes (Signed)
Physical Therapy Session Note  Patient Details  Name: Jared Cline MRN: [id number] Date of Birth: [birth year]  Today's Date: 09/05/2022 PT Individual Time: 1937-9024 PT Individual Time Calculation (min): 59 min   Short Term Goals: Week 1:  PT Short Term Goal 1 (Week 1): Pt will complete bed mobility with supervision PT Short Term Goal 2 (Week 1): Pt will complete bed<>Chair transfers with supervision PT Short Term Goal 3 (Week 1): Pt will ambulate 136f with CGA and LRAD PT Short Term Goal 4 (Week 1): Pt will participate in functional outcome measure to address falls risk  Skilled Therapeutic Interventions/Progress Updates:    Pt received asleep, supine in bed but easily awakens and agreeable to therapy session. Supine>sitting R EOB, HOB elevated, with supervision and increased time/effort. Donned socks and tennis shoes total assist for time management.   Sit>stand elevated EOB>RW with max cuing to recall/carryover proper biomechanics from yesterday with pt requiring 2x attempts to come to stand to have improved carryover - CGA for safety.  Gait training ~155fto main therapy gym using RW with CGA and therapist bringing w/c in event of fatigue but not used - slow gait speed, narrow BOS with slight scissoring (R more than LE) with R LE slightly externally rotated, forward trunk flexed posture with R lateral trunk lean, decreased B LE step lengths (R smaller than L), and lack of knee flexion during swing phase. At end of gait, pt reports his R knee was "letting him know it is there" towards the end, meaning increased pain/discomfort.  Dynamic standing balance task of reaching to place clothespins on basketball goal net with CGA for safety but no moments of significant instability therefore had pt progress to standing on airex pad to complete same task with light min assist for balance due to varying R/L lateral and anterior vs posterior lean/minor LOB.  Dynamic stepping balance task of R/L side  stepping over hockey stick with L HHA from +2 and R UE support over therapist's shoulders providing heavy min assist for balance; progressed to only R UE support over therapist's shoulders - x10 reps with pt having to take 2 small step to get both feet over.   Pt very limited in amount of standing activity he can tolerate without UE support because this causes increased R knee pain; significantly limiting his participation in dynamic stepping balance tasks without UE support.  Gait training again 13031fsing RW with CGA - continues to have narrow BOS with R LE slightly scissoring, same gait as above with continued lack of knee flexion during swing and R lateral trunk lean.   Stair navigation training ascending/descending 8 steps (6" height) using R HR only to simulate home set-up in event he cannot reach both handrails simultaneously with HHA on opposite side and min assist for balance. Educated pt on need to continue progressing his independence with this if he can only use 1 HR at home.  Transported back to his room and left seated in w/c with needs in reach and seat belt alarm on.    Therapy Documentation Precautions:  Precautions Precautions: Fall Restrictions Weight Bearing Restrictions: No   Pain:  Reports chronic R knee pain that increases with longer distance gait training but decreases with seated rest break during session.   Therapy/Group: Individual Therapy  CarTawana ScalePT, DPT, NCS, CSRS 09/05/2022, 7:58 AM

## 2022-09-05 NOTE — Progress Notes (Signed)
PROGRESS NOTE   Subjective/Complaints:  Reports he is doing well overall.  Has not had a BM in a few days and feels a little constipated.  No additional concerns.  Review of systems negative for chest pain or shortness of breath, cough, rash, nausea or vomiting.  No abdominal pain  Objective:   DG Abd 1 View  Result Date: 09/03/2022 CLINICAL DATA:  Abdominal pain. EXAM: ABDOMEN - 1 VIEW COMPARISON:  None Available. FINDINGS: Visualized bowel gas pattern is nonobstructive. No evidence of free intraperitoneal air. No evidence of soft tissue mass or abnormal fluid collection. No evidence of renal or ureteral calculi. Lung bases appear clear. IMPRESSION: No acute findings.  Grossly nonobstructive bowel gas pattern. Electronically Signed   By: Franki Cabot M.D.   On: 09/03/2022 09:31   No results for input(s): "WBC", "HGB", "HCT", "PLT" in the last 72 hours.  No results for input(s): "NA", "K", "CL", "CO2", "GLUCOSE", "BUN", "CREATININE", "CALCIUM" in the last 72 hours.   Intake/Output Summary (Last 24 hours) at 09/05/2022 0811 Last data filed at 09/05/2022 0727 Gross per 24 hour  Intake 826 ml  Output 1100 ml  Net -274 ml         Physical Exam: Vital Signs Blood pressure 132/62, pulse 62, temperature 98.2 F (36.8 C), resp. rate 18, height 6' (1.829 m), weight 98.6 kg, SpO2 100 %.   General: No acute distress Mood and affect are appropriate Heart: Regular rate and rhythm no rubs murmurs or extra sounds Lungs: Clear to auscultation, breathing unlabored, no rales or wheezes, good air movement Abdomen: normoactive BS, soft nontender to palpation, mildly distended Extremities: No clubbing, cyanosis, or edema Skin: No evidence of breakdown, no evidence of rash Neurologic: Cranial nerves II through XII grossly intact, motor strength is 5/5 in bilateral deltoid, bicep, tricep, grip, hip flexor, knee extensors, ankle dorsiflexor  and plantar flexor  Cerebellar exam normal finger to nose to finger as well as heel to shin in bilateral upper and lower extremities Musculoskeletal:RIght knee effusion noted suprapatellar bursa   Assessment/Plan: 1. Functional deficits which require 3+ hours per day of interdisciplinary therapy in a comprehensive inpatient rehab setting. Physiatrist is providing close team supervision and 24 hour management of active medical problems listed below. Physiatrist and rehab team continue to assess barriers to discharge/monitor patient progress toward functional and medical goals  Care Tool:  Bathing    Body parts bathed by patient: Right arm, Left arm, Chest, Abdomen, Front perineal area, Buttocks, Right upper leg, Left upper leg, Face, Left lower leg, Right lower leg         Bathing assist Assist Level: Contact Guard/Touching assist     Upper Body Dressing/Undressing Upper body dressing   What is the patient wearing?: Pull over shirt    Upper body assist Assist Level: Set up assist    Lower Body Dressing/Undressing Lower body dressing      What is the patient wearing?: Underwear/pull up, Pants     Lower body assist Assist for lower body dressing: Contact Guard/Touching assist     Toileting Toileting    Toileting assist Assist for toileting: Contact Guard/Touching assist     Transfers  Chair/bed transfer  Transfers assist     Chair/bed transfer assist level: Contact Guard/Touching assist     Locomotion Ambulation   Ambulation assist      Assist level: Contact Guard/Touching assist Assistive device: Walker-rolling Max distance: 100'   Walk 10 feet activity   Assist     Assist level: Contact Guard/Touching assist Assistive device: Walker-rolling   Walk 50 feet activity   Assist    Assist level: Contact Guard/Touching assist Assistive device: Walker-rolling    Walk 150 feet activity   Assist Walk 150 feet activity did not occur:  Safety/medical concerns (fatigue)         Walk 10 feet on uneven surface  activity   Assist Walk 10 feet on uneven surfaces activity did not occur: Safety/medical concerns         Wheelchair     Assist Is the patient using a wheelchair?: Yes Type of Wheelchair: Manual    Wheelchair assist level: Moderate Assistance - Patient 50 - 74% Max wheelchair distance: 76'    Wheelchair 50 feet with 2 turns activity    Assist        Assist Level: Moderate Assistance - Patient 50 - 74%   Wheelchair 150 feet activity     Assist      Assist Level: Maximal Assistance - Patient 25 - 49%   Blood pressure 132/62, pulse 62, temperature 98.2 F (36.8 C), resp. rate 18, height 6' (1.829 m), weight 98.6 kg, SpO2 100 %.  Medical Problem List and Plan: 1. Functional deficits secondary to R ponto-cerebellar CVA             -patient may  shower             -ELOS/Goals: 09/10/22 mod I, CGA car transfers             - Primary barriers: balance and coordination deficits, R knee pain, 5 STE home    2. New diagnosis segmental PE/Antithrombotics: -DVT/anticoagulation:  Pharmaceutical: Eliquis- at least 95mo            -antiplatelet therapy: N/A (ASA after DOAC completed)  3. Pain Management: oxycodone prn for knee pain.              - Pre-medicate AM therapies to allow participation             - See management of R knee effusion below 4. Mood/Behavior/Sleep: LCSW to follow for evaluation and support.              --Trazodone prn for sleep.              -antipsychotic agents: N/A 5. Neuropsych/cognition: This patient is capable of making decisions on his own behalf. 6. Skin/Wound Care: Routine pressure relief measures.  7. Fluids/Electrolytes/Nutrition: Monitor I/O. Check CMET in am.  8. HTN: Monitor BP TID- continue    09/05/2022    4:24 AM 09/04/2022    8:02 PM 09/04/2022    1:50 PM  Vitals with BMI  Systolic [id number] Diastolic 62 60 59  Pulse 62 63 66     mild  systolic elevation  On  clonidine, avapro, hydralazine, spironolactone -1/18 well-controlled continue monitor. 9. CKD 3A: Baseline SCr 1.3-1.4. Reports mild dysuria since admission. Encourage fluid intake.  BUN/Cr improved 1/12 vs 1/11 BMP ordered for tomorrow 10. OSA: Continue CPAP 11. Prostate cancer s/p prostatectomy: 12. Right knee OA/effusion: Used to get his knee injected every 6 months and missed an  appt last week. --Add voltaren gel QID with aquatherapy prn.  --Oxycodone prn pain--may need premedication.  --consult ortho for aspiration. Pending results, would consider steroid injection (was due for OP bilateral injections prior to admission) 13. Hyponatremia: Borderline low. Continue to monitor sodium levels. Stable valus 1/12 vs 1/11  1/17 Na stable 132   Recheck tomorrow am 14. Acute blood loss anemia: Recheck in am  1/17 HGB stable at 12.3  15. Constipation: likely cause of abd discomfort, mild nausea no vomiting  --results with sorbitol but still has abd discomfort will check KUB -1/17 abdominal xray negative, simethicone and scopolamine started yesterday 1/18 sorbitol ordered      LOS: 7 days A FACE TO FACE EVALUATION WAS PERFORMED  Jennye Boroughs 09/05/2022, 8:11 AM

## 2022-09-05 NOTE — Progress Notes (Signed)
Occupational Therapy Session Note  Patient Details  Name: Jared Cline MRN: [id number] Date of Birth: Apr 26, 1946  Today's Date: 09/05/2022 OT Individual Time: 1300-1414 OT Individual Time Calculation (min): 74 min    Short Term Goals: Week 1:  OT Short Term Goal 1 (Week 1): Pt will be able to ambulate to bathroom with S using LRAD. OT Short Term Goal 2 (Week 1): Pt will complete LB dressing with supervision. OT Short Term Goal 3 (Week 1): Pt will complete shower with set up A/S. OT Short Term Goal 4 (Week 1): Pt will demonstrate independence with completing visual fixation exercises.  Skilled Therapeutic Interventions/Progress Updates:     Pt received sitting up in wc in good spirits and receptive to skilled OT session reporting he has had a wonderful day of therapy so far. Pt reporting 0/10 pain.  Pt transported total A to therapy gym in wc for time management and energy conservation. Pt completed dynamic standing and visual scanning/accomodation task at horizontal mirror. Pt required CGA-min HHA to maintain dynamic standing balance when reaching laterally and superiorly outside his BOS. Pt reporting mild dizziness 3/10 with symptoms quickly recovering following visual fixation. Pt completed 3 sit<>stands with min cues for body mechanics and technique during tasks. Pt able to maintain stand to remove all cards from vertical mirror with rest break provided following. Pt required 3 1-2 minute rest breaks when matching cards for safety and mild decreased activity tolerance. Mod verbal cues required for weight shifting d/t posterior and R lean preference.  Pt completed functional mobility visual scanning activity ambulating using RW. Pt required CGA for balance and mod cues for safety d/t posterior lean, scissoring gait, and short step length. Pt able to locate 10/10 colored disks during task with mod cues for visual scanning. Pt reporting mild pain in knee following activity and 3/10 dizziness with  instruction provided to fixate eyes with symptoms recovering quickly. Pt transported back to room total A in wc. Pt ambulated and transferred to bed using RW CGA. Pt was left resting in bed with call bell in reach, bed alarm on, and all needs met. Rn notified of Pt pain and request for topical pain medication.   Therapy Documentation Precautions:  Precautions Precautions: Fall Restrictions Weight Bearing Restrictions: No   ADL: ADL Eating: Independent Grooming: Independent Upper Body Bathing: Setup Lower Body Bathing: Contact guard Upper Body Dressing: Setup Lower Body Dressing: Contact guard Toileting: Contact guard Toilet Transfer: Contact guard Toilet Transfer Method: Counselling psychologist: Grab bars, Raised toilet seat (RW)   Therapy/Group: Individual Therapy  Janey Genta 09/05/2022, 1:17 PM

## 2022-09-06 DIAGNOSIS — M1711 Unilateral primary osteoarthritis, right knee: Secondary | ICD-10-CM

## 2022-09-06 DIAGNOSIS — N1831 Chronic kidney disease, stage 3a: Secondary | ICD-10-CM

## 2022-09-06 LAB — BASIC METABOLIC PANEL
Anion gap: 7 (ref 5–15)
BUN: 23 mg/dL (ref 8–23)
CO2: 23 mmol/L (ref 22–32)
Calcium: 9.3 mg/dL (ref 8.9–10.3)
Chloride: 101 mmol/L (ref 98–111)
Creatinine, Ser: 1.35 mg/dL — ABNORMAL HIGH (ref 0.61–1.24)
GFR, Estimated: 54 mL/min — ABNORMAL LOW (ref >60)
Glucose, Bld: 90 mg/dL (ref 70–99)
Potassium: 4.3 mmol/L (ref 3.5–5.1)
Sodium: 131 mmol/L — ABNORMAL LOW (ref 135–145)

## 2022-09-06 NOTE — Progress Notes (Signed)
Occupational Therapy Session Note  Patient Details  Name: Jared Cline MRN: [id number] Date of Birth: [birth year]  Today's Date: 09/06/2022 OT Individual Time: 1100-1200 OT Individual Time Calculation (min): 60 min    Short Term Goals: Week 1:  OT Short Term Goal 1 (Week 1): Pt will be able to ambulate to bathroom with S using LRAD. OT Short Term Goal 2 (Week 1): Pt will complete LB dressing with supervision. OT Short Term Goal 3 (Week 1): Pt will complete shower with set up A/S. OT Short Term Goal 4 (Week 1): Pt will demonstrate independence with completing visual fixation exercises.  Skilled Therapeutic Interventions/Progress Updates:     Pain reported during session as 4/10. Heat provided for pain relief with no skin irritation throughout session. Patient actively participated in the [place] program individually with this clinician in a group setting for social participation. Session focused on education and training in breathing techniques to regulate the nervous system, gentle yoga poses with a focus on standing balance, visual fixation, tracking, guided meditation to regulate attention and guided discussion on the topic of gratitude. Skilled treatment interventions include. Patient required min verbal cues, demonstration and CGA assist for standing balnce. Exited session with pt seated in w/c, exit alarm on and call light in reach   Therapy Documentation Precautions:  Precautions Precautions: Fall Restrictions Weight Bearing Restrictions: No General:    Therapy/Group: Individual Therapy  Tonny Branch 09/06/2022, 6:56 AM

## 2022-09-06 NOTE — Progress Notes (Signed)
PROGRESS NOTE   Subjective/Complaints:  Constipation improved after bowel movement yesterday.  He has knee pain but reports it is under control with Voltaren gel.  Review of systems negative for chest pain or shortness of breath, cough, rash, nausea or vomiting, constipation.  No abdominal pain  Objective:   No results found. No results for input(s): "WBC", "HGB", "HCT", "PLT" in the last 72 hours.  Recent Labs    09/06/22 0608  NA 131*  K 4.3  CL 101  CO2 23  GLUCOSE 90  BUN 23  CREATININE 1.35*  CALCIUM 9.3     Intake/Output Summary (Last 24 hours) at 09/06/2022 0813 Last data filed at 09/06/2022 0749 Gross per 24 hour  Intake 476 ml  Output 525 ml  Net -49 ml         Physical Exam: Vital Signs Blood pressure 139/60, pulse (!) 57, temperature 98.3 F (36.8 C), resp. rate 17, height 6' (1.829 m), weight 95.3 kg, SpO2 100 %.   General: No acute distress Mood and affect are appropriate Heart: Regular rate and rhythm no rubs murmurs or extra sounds Lungs: Clear to auscultation, breathing unlabored, no rales or wheezes, good air movement Abdomen: normoactive BS, soft nontender to palpation, non-distended Extremities: No clubbing, cyanosis, or edema Skin: No evidence of breakdown, no evidence of rash Neurologic: Cranial nerves II through XII grossly intact, motor strength is 5/5 in bilateral deltoid, bicep, tricep, grip, hip flexor, knee extensors, ankle dorsiflexor and plantar flexor  Cerebellar exam normal finger to nose to finger as well as heel to shin in bilateral upper and lower extremities Musculoskeletal:RIght knee effusion noted suprapatellar bursa, minimal right knee joint line tenderness  Assessment/Plan: 1. Functional deficits which require 3+ hours per day of interdisciplinary therapy in a comprehensive inpatient rehab setting. Physiatrist is providing close team supervision and 24 hour  management of active medical problems listed below. Physiatrist and rehab team continue to assess barriers to discharge/monitor patient progress toward functional and medical goals  Care Tool:  Bathing    Body parts bathed by patient: Right arm, Left arm, Chest, Abdomen, Front perineal area, Buttocks, Right upper leg, Left upper leg, Face, Left lower leg, Right lower leg         Bathing assist Assist Level: Contact Guard/Touching assist     Upper Body Dressing/Undressing Upper body dressing   What is the patient wearing?: Pull over shirt    Upper body assist Assist Level: Set up assist    Lower Body Dressing/Undressing Lower body dressing      What is the patient wearing?: Underwear/pull up, Pants     Lower body assist Assist for lower body dressing: Contact Guard/Touching assist     Toileting Toileting    Toileting assist Assist for toileting: Contact Guard/Touching assist     Transfers Chair/bed transfer  Transfers assist     Chair/bed transfer assist level: Contact Guard/Touching assist     Locomotion Ambulation   Ambulation assist      Assist level: Contact Guard/Touching assist Assistive device: Walker-rolling Max distance: 100'   Walk 10 feet activity   Assist     Assist level: Contact Guard/Touching assist Assistive device:  Walker-rolling   Walk 50 feet activity   Assist    Assist level: Contact Guard/Touching assist Assistive device: Walker-rolling    Walk 150 feet activity   Assist Walk 150 feet activity did not occur: Safety/medical concerns (fatigue)         Walk 10 feet on uneven surface  activity   Assist Walk 10 feet on uneven surfaces activity did not occur: Safety/medical concerns         Wheelchair     Assist Is the patient using a wheelchair?: Yes Type of Wheelchair: Manual    Wheelchair assist level: Moderate Assistance - Patient 50 - 74% Max wheelchair distance: 16'    Wheelchair 50 feet  with 2 turns activity    Assist        Assist Level: Moderate Assistance - Patient 50 - 74%   Wheelchair 150 feet activity     Assist      Assist Level: Maximal Assistance - Patient 25 - 49%   Blood pressure 139/60, pulse (!) 57, temperature 98.3 F (36.8 C), resp. rate 17, height 6' (1.829 m), weight 95.3 kg, SpO2 100 %.  Medical Problem List and Plan: 1. Functional deficits secondary to R ponto-cerebellar CVA             -patient may  shower             -ELOS/Goals: 09/10/22 mod I, CGA car transfers             - Primary barriers: balance and coordination deficits, R knee pain, 5 STE home    2. New diagnosis segmental PE/Antithrombotics: -DVT/anticoagulation:  Pharmaceutical: Eliquis- at least 72mo            -antiplatelet therapy: N/A (ASA after DOAC completed)  3. Pain Management: oxycodone prn for knee pain.              - Pre-medicate AM therapies to allow participation             - See management of R knee effusion below 4. Mood/Behavior/Sleep: LCSW to follow for evaluation and support.              --Trazodone prn for sleep.              -antipsychotic agents: N/A 5. Neuropsych/cognition: This patient is capable of making decisions on his own behalf. 6. Skin/Wound Care: Routine pressure relief measures.  7. Fluids/Electrolytes/Nutrition: Monitor I/O. Check CMET in am.  8. HTN: Monitor BP TID- continue    09/06/2022    4:36 AM 09/05/2022    8:04 PM 09/05/2022    3:55 PM  Vitals with BMI  Weight   210 lbs 2 oz  BMI   285.46 Systolic 12701350  Diastolic 60 62   Pulse 57 74      mild systolic elevation  On  clonidine, avapro, hydralazine, spironolactone 1/19 BP well-controlled overall 9. CKD 3A: Baseline SCr 1.3-1.4. Reports mild dysuria since admission. Encourage fluid intake.  BUN/Cr improved 1/12 vs 1/11 1/19 CR stable at 1.35 continue to monitor 10. OSA: Continue CPAP 11. Prostate cancer s/p prostatectomy: 12. Right knee OA/effusion: Used to get  his knee injected every 6 months and missed an appt last week. --Add voltaren gel QID with aquatherapy prn.  --Oxycodone prn pain--may need premedication.  --consult ortho for aspiration. Pending results, would consider steroid injection (was due for OP bilateral injections prior to admission) -1/19 reports pain controlled with Voltaren gel 13.  Hyponatremia: Borderline low. Continue to monitor sodium levels. Stable valus 1/12 vs 1/11  1/17 Na stable 132  1/19 NA stable at 131 14. Acute blood loss anemia: Recheck in am  1/17 HGB stable at 12.3  15. Constipation: likely cause of abd discomfort, mild nausea no vomiting  --results with sorbitol but still has abd discomfort will check KUB -1/17 abdominal xray negative, simethicone and scopolamine started yesterday 1/18 sorbitol ordered 1/19 constipation improved after 2 BMs      LOS: 8 days A FACE TO FACE EVALUATION WAS PERFORMED  Jennye Boroughs 09/06/2022, 8:13 AM

## 2022-09-06 NOTE — Progress Notes (Signed)
Patient states at this time he had some dizziness in therapy that was resolved by lying back in bed. BP 121/58   HR 52-54 bpm.

## 2022-09-06 NOTE — Progress Notes (Signed)
Physical Therapy Session Note  Patient Details  Name: Jared Cline MRN: [id number] Date of Birth: [birth year]  Today's Date: 09/06/2022 PT Individual Time: 0915-1025 PT Individual Time Calculation (min): 70 min   Short Term Goals: Week 1:  PT Short Term Goal 1 (Week 1): Pt will complete bed mobility with supervision PT Short Term Goal 2 (Week 1): Pt will complete bed<>Chair transfers with supervision PT Short Term Goal 3 (Week 1): Pt will ambulate 1105f with CGA and LRAD PT Short Term Goal 4 (Week 1): Pt will participate in functional outcome measure to address falls risk  Skilled Therapeutic Interventions/Progress Updates:      Pt sitting in w/c to start - agreeable to PT tx and denies pain. Reports his dizziness is better today although bending down to pick up his pants triggerd it earlier, resolved.   Transported in w/c to main rehab gym for time management. Donned tennis shoes with totalA for time.   Sit<>stand to RW from w/c height with supervision, continues to rely heavily on UE 's for pushing to stand. Gait training 2046fwith supervision and RW - slow gait speed with slightly flexed trunk, downward gaze. Worked on postural awareness with forward gaze stabilization while walking. Careful stepping during turns.   Stair training using 6inch steps and 2 hand rails. Pt reports he has 2 rails and can reach both of them when he enters his home. Pt navigated up/down x12 steps with supervision - step to pattern while forward facing. Mild dizziness when turning at the top/bottom of the stairs but no LOB .   Supine mat table and standing there-ex completed as listed below, 2x15 each.   Access Code: [email or web address] [email or web address]: [email or web address] Date: 09/06/2022 Prepared by: ChGinnie SmartExercises - Supine Hip Abduction with Resistance at Ankles  - 1 x daily - 7 x weekly - 3 sets - 10 reps - Supine Bridge with Resistance Band  - 1 x daily - 7 x weekly - 3 sets - 10 reps -  Hooklying Clamshell with Resistance  - 1 x daily - 7 x weekly - 3 sets - 10 reps - Supine March with Resistance Band  - 1 x daily - 7 x weekly - 3 sets - 10 reps - Side Stepping with Resistance at Ankles and Counter Support  - 1 x daily - 7 x weekly - 3 sets - 10 reps   HEP provided and placed in CVA booklet in his room - pt made aware.  Returned to his room and patient concluded session seated in w/c, call bell in reach.   Therapy Documentation Precautions:  Precautions Precautions: Fall Restrictions Weight Bearing Restrictions: No General:   Therapy/Group: Individual Therapy  Darianne Muralles P Isabella Ida PT 09/06/2022, 7:24 AM

## 2022-09-06 NOTE — Progress Notes (Signed)
Occupational Therapy Weekly Progress Note  Patient Details  Name: Jared Cline MRN: [id number] Date of Birth: June 17, 1946  Beginning of progress report period: August 29, 2021 End of progress report period: September 06, 2021  Today's Date: 09/06/2022 OT Individual Time: 1300-1415 OT Individual Time Calculation (min): 75 min    Patient has met 2 of 4 short term goals. Pt is demonstrating increased independence in completing LB BADLs, bathing tasks, and functional mobility. Pt demonstrating increased learning of visual fixation exercises, however requires min-mod A to utilize during functional tasks.   Patient continues to demonstrate the following deficits: muscle weakness, decreased cardiorespiratoy endurance, decreased coordination, central origin, and decreased sitting balance, decreased standing balance, and decreased balance strategies and therefore will continue to benefit from skilled OT intervention to enhance overall performance with BADL, iADL, Vocation, and Reduce care partner burden.  Patient progressing toward long term goals..  Continue plan of care.  OT Short Term Goals Week 1:  OT Short Term Goal 1 (Week 1): Pt will be able to ambulate to bathroom with S using LRAD. OT Short Term Goal 1 - Progress (Week 1): Progressing toward goal OT Short Term Goal 2 (Week 1): Pt will complete LB dressing with supervision. OT Short Term Goal 2 - Progress (Week 1): Met OT Short Term Goal 3 (Week 1): Pt will complete shower with set up A/S. OT Short Term Goal 3 - Progress (Week 1): Met OT Short Term Goal 4 (Week 1): Pt will demonstrate independence with completing visual fixation exercises. OT Short Term Goal 4 - Progress (Week 1): Progressing toward goal Week 2:  OT Short Term Goal 1 (Week 2): STG=LTG d/t pt length of stay  Skilled Therapeutic Interventions/Progress Updates:     Pt received sitting up in wc presenting to be in good spirits and receptive to skilled OT session. Pt reporting  0/10 pain and mid dizziness 3/10. Pt re-educated on visual fixation with Pt able to fixate eyes with min verbal cues.  Pt transported total A to ADL apartment for transfer and functional mobility training. Pt educated on tub bench transfer with demonstration provided. Pt completed transfer <>tub bench using RW CGA with mod cues for safety. Pt educated on bathroom safety- receptive to education. Pt practiced completing sit>stands in shower to wash bottom while holding onto grab bars with close supervision. Pt also educated on modified strategy of leaning R/L to wash bottom with Pt demonstrating teach back as evidence of learning.  Pt educated on Risk analyst using RW. Pt completed kitchen functional mobility task removing/placing items in high/low cabinets and drawers with close supervision to CGA. Pt then educated on transporting items in kitchen with Pt able to transport 8/8 items by completing side steps without LOB.  Pt practiced furniture transfer from [place]. Pt required heavy min A during sit>stand using RW for safety d/t low surface.  Pt requesting to use restroom at end of session transported to room total A in wc. Pt ambulated to bathroom using RW and toileted standing up (continent void) with CGA holding onto grab bard. Pt washed hands standing at sink CGA with min cues for weight shifting to COB. Pt CGA back to bed. Pt left resting in bed with call bell in reach, bed alarm on, and all needs met.   Therapy Documentation Precautions:  Precautions Precautions: Fall Restrictions Weight Bearing Restrictions: No General:   Vital Signs: Therapy Vitals Temp: 97.6 F (36.4 C) Temp Source: Oral Pulse Rate: 64 Resp: 18 BP: 128/62 Patient Position (  if appropriate): Sitting Oxygen Therapy SpO2: 100 % O2 Device: Room Air Pain: 0/10   ADL: ADL Eating: Independent Grooming: Independent Upper Body Bathing: Setup Lower Body Bathing: Contact guard Upper Body Dressing: Setup Lower Body  Dressing: Contact guard Toileting: Contact guard Toilet Transfer: Contact guard Toilet Transfer Method: Counselling psychologist: Grab bars, Raised toilet seat (RW)  Therapy/Group: Individual Therapy  Janey Genta 09/06/2022, 1:13 PM

## 2022-09-07 NOTE — Progress Notes (Signed)
Occupational Therapy Session Note  Patient Details  Name: Jared Cline MRN: [id number] Date of Birth: 04/05/1946  Today's Date: 09/07/2022 OT Individual Time: 3536-1443 OT Individual Time Calculation (min): 45 min    Short Term Goals: Week 1:  OT Short Term Goal 1 (Week 1): Pt will be able to ambulate to bathroom with S using LRAD. OT Short Term Goal 1 - Progress (Week 1): Progressing toward goal OT Short Term Goal 2 (Week 1): Pt will complete LB dressing with supervision. OT Short Term Goal 2 - Progress (Week 1): Met OT Short Term Goal 3 (Week 1): Pt will complete shower with set up A/S. OT Short Term Goal 3 - Progress (Week 1): Met OT Short Term Goal 4 (Week 1): Pt will demonstrate independence with completing visual fixation exercises. OT Short Term Goal 4 - Progress (Week 1): Progressing toward goal  Skilled Therapeutic Interventions/Progress Updates:    Pt received supine in bed and had just received lunch. Transitioned to sitting with OT emphasizing gaze stabilization to minimize dizziness. Pt ate lunch sitting EOB while engaging in therapeutic discussion incorporating discharge planning. Engaged in functional mobility in room incorporating frequent turning using RW to promote balance and minimize dizziness with directional change. Pt required CGA for functional mobility with RW and balance. At end of session, pt left supine in bed with all needs in reach.   Therapy Documentation Precautions:  Precautions Precautions: Fall Restrictions Weight Bearing Restrictions: No General:   Vital Signs:  Pain: Pain Assessment Pain Score: 3  ADL: ADL Eating: Independent Grooming: Independent Upper Body Bathing: Setup Lower Body Bathing: Contact guard Upper Body Dressing: Setup Lower Body Dressing: Contact guard Toileting: Contact guard Toilet Transfer: Contact guard Toilet Transfer Method: Counselling psychologist: Grab bars, Raised toilet seat (RW) Vision    Perception    Praxis Praxis: Intact Balance   Exercises:   Other Treatments:     Therapy/Group: Individual Therapy  Jared Cline 09/07/2022, 2:39 PM

## 2022-09-07 NOTE — Discharge Summary (Signed)
Physical Therapy Discharge Summary  Patient Details  Name: Jared Cline MRN: [id number] Date of Birth: 16-Dec-1945  Date of Discharge from PT service:September 09, 2022   Patient has met 7 of 7 long term goals due to improved activity tolerance, improved balance, improved postural control, increased strength, ability to compensate for deficits, improved attention, and improved awareness.  Patient to discharge at an ambulatory level Modified Independent using RW.   Patient's care partner attended hands-on education/training and is independent to provide the necessary physical assistance at discharge.  Reasons goals not met: n/a  Recommendation:  Patient will benefit from ongoing skilled PT services in home health setting to continue to advance safe functional mobility, address ongoing impairments in B LE strength, dynamic standing balance, dynamic gait training using LRAD, and minimize fall risk.  Equipment: RW  Reasons for discharge: treatment goals met and discharge from hospital  Patient/family agrees with progress made and goals achieved: Yes  PT Discharge Precautions/Restrictions Precautions Precautions: Fall Restrictions Weight Bearing Restrictions: No Pain Interference Pain Interference Pain Effect on Sleep: 1. Rarely or not at all Pain Interference with Therapy Activities: 2. Occasionally Pain Interference with Day-to-Day Activities: 2. Occasionally Vision/Perception  Vision - History Ability to See in Adequate Light: 0 Adequate Perception Perception: Within Functional Limits Praxis Praxis: Intact  Cognition Overall Cognitive Status: Within Functional Limits for tasks assessed Arousal/Alertness: Awake/alert Orientation Level: Oriented X4 Attention: Focused;Sustained;Selective Focused Attention: Appears intact Sustained Attention: Appears intact Selective Attention: Appears intact Memory: Appears intact Awareness: Appears intact Problem Solving: Appears  intact Safety/Judgment: Appears intact Sensation Sensation Light Touch: Appears Intact Hot/Cold: Not tested Proprioception: Appears Intact Stereognosis: Not tested Coordination Gross Motor Movements are Fluid and Coordinated: No Coordination and Movement Description: Limited by B knee pain Motor  Motor Motor: Within Functional Limits  Mobility Bed Mobility Bed Mobility: Supine to Sit;Sit to Supine Supine to Sit: Independent Sit to Supine: Independent Transfers Transfers: Sit to Stand;Stand to Sit;Stand Pivot Transfers Sit to Stand: Independent with assistive device Stand to Sit: Independent with assistive device Stand Pivot Transfers: Independent with assistive device Transfer (Assistive device): Rolling walker Locomotion  Gait Ambulation: Yes Gait Assistance: Independent with assistive device Gait Distance (Feet): 150 Feet Assistive device: Rolling walker Gait Gait: Yes Gait Pattern: Within Functional Limits (antalgic 2/2 knee pain (chronic)) Stairs / Additional Locomotion Stairs: Yes Stairs Assistance: Supervision/Verbal cueing Stair Management Technique: Two rails;Step to pattern;Forwards Number of Stairs: 12 Height of Stairs: 6 Wheelchair Mobility Wheelchair Mobility: No  Trunk/Postural Assessment  Cervical Assessment Cervical Assessment: Within Functional Limits Thoracic Assessment Thoracic Assessment: Within Functional Limits Lumbar Assessment Lumbar Assessment: Within Functional Limits Postural Control Postural Control: Deficits on evaluation Trunk Control: posterior bias in unsupported standing  Balance Balance Balance Assessed: Yes Standardized Balance Assessment Standardized Balance Assessment: Berg Balance Test Berg Balance Test Sit to Stand: Able to stand  independently using hands Standing Unsupported: Able to stand 2 minutes with supervision Sitting with Back Unsupported but Feet Supported on Floor or Stool: Able to sit safely and securely 2  minutes Stand to Sit: Controls descent by using hands Transfers: Able to transfer safely, definite need of hands Standing Unsupported with Eyes Closed: Able to stand 3 seconds Standing Ubsupported with Feet Together: Needs help to attain position but able to stand for 30 seconds with feet together From Standing, Reach Forward with Outstretched Arm: Loses balance while trying/requires external support From Standing Position, Pick up Object from Floor: Unable to try/needs assist to keep balance From Standing Position, Turn to  Look Behind Over each Shoulder: Needs supervision when turning Turn 360 Degrees: Needs close supervision or verbal cueing Standing Unsupported, Alternately Place Feet on Step/Stool: Needs assistance to keep from falling or unable to try Standing Unsupported, One Foot in Front: Able to take small step independently and hold 30 seconds Standing on One Leg: Unable to try or needs assist to prevent fall Total Score: 23 Static Sitting Balance Static Sitting - Balance Support: Feet supported;No upper extremity supported Static Sitting - Level of Assistance: 7: Independent Dynamic Sitting Balance Dynamic Sitting - Balance Support: Feet supported;No upper extremity supported Dynamic Sitting - Level of Assistance: 7: Independent Static Standing Balance Static Standing - Balance Support: Bilateral upper extremity supported Static Standing - Level of Assistance: 6: Modified independent (Device/Increase time) Dynamic Standing Balance Dynamic Standing - Balance Support: Bilateral upper extremity supported;During functional activity Dynamic Standing - Level of Assistance: 5: Stand by assistance Extremity Assessment      RLE Assessment RLE Assessment: Exceptions to [facility] Active Range of Motion (AROM) Comments: WFL but some knee valgus noted in standing General Strength Comments: assessed in sitting RLE Strength RLE Overall Strength: Deficits Right Hip Flexion: 3-/5 Right Hip  ABduction: 3+/5 Right Knee Flexion: 4-/5 Right Knee Extension: 4/5 Right Ankle Dorsiflexion: 4+/5 Right Ankle Plantar Flexion: 4+/5 LLE Assessment LLE Assessment: Exceptions to [facility] Active Range of Motion (AROM) Comments: WFL General Strength Comments: assessed in sitting LLE Strength LLE Overall Strength: Deficits Left Hip Flexion: 3-/5 Left Hip ABduction: 3+/5 Left Knee Flexion: 3+/5 Left Knee Extension: 4/5 Left Ankle Dorsiflexion: 4+/5 Left Ankle Plantar Flexion: 4+/5   Carly Francis Dowse , PT, DPT, NCS, CSRS [place], PT, DPT 09/09/2022, 12:14 PM

## 2022-09-07 NOTE — Progress Notes (Signed)
Physical Therapy Weekly Progress Note  Patient Details  Name: Jared Cline MRN: [id number] Date of Birth: Jul 29, 1946  Beginning of progress report period: August 30, 2022 End of progress report period: September 07, 2022  Today's Date: 09/07/2022 PT Individual Time: 0810-0905 PT Individual Time Calculation (min): 55 min   Patient has met 4 of 4 short term goals. Jared Cline is making excellent progress with physical therapy demonstrating increasing independence with his functional mobility. He is performing bed mobility independently, sit<>stand and stand pivot transfers using RW with supervision, gait training up to 261f using RW supervision, and navigating 12 stairs using B HRs with supervision. He continues to be limited due to impaired endurance, B LE strength deficits, and chronic R knee pain. Family education is planned with his wife on Monday 1/22 with plan for D/C home Tuesday 1/23 with 24hr support from family and follow-up OPPT.  Patient continues to demonstrate the following deficits muscle weakness, decreased cardiorespiratoy endurance, unbalanced muscle activation and decreased motor planning, central origin, and decreased standing balance, decreased postural control, and decreased balance strategies and therefore will continue to benefit from skilled PT intervention to increase functional independence with mobility.  Patient progressing toward long term goals..  Continue plan of care.  PT Short Term Goals  Week 1:  PT Short Term Goal 1 (Week 1): Pt will complete bed mobility with supervision PT Short Term Goal 1 - Progress (Week 1): Met PT Short Term Goal 2 (Week 1): Pt will complete bed<>Chair transfers with supervision PT Short Term Goal 2 - Progress (Week 1): Met PT Short Term Goal 3 (Week 1): Pt will ambulate 1559fwith CGA and LRAD PT Short Term Goal 3 - Progress (Week 1): Met PT Short Term Goal 4 (Week 1): Pt will participate in functional outcome measure to address falls  risk PT Short Term Goal 4 - Progress (Week 1): Met Week 2:  PT Short Term Goal 1 (Week 2): = to LTGs based on ELOS  Skilled Therapeutic Interventions/Progress Updates:  Ambulation/gait training;Discharge planning;Functional mobility training;Psychosocial support;Therapeutic Activities;Balance/vestibular training;Disease management/prevention;Neuromuscular re-education;Skin care/wound management;Therapeutic Exercise;UE/LE Strength taining/ROM;Splinting/orthotics;Pain management;DME/adaptive equipment instruction;Cognitive remediation/compensation;Community reintegration;Functional electrical stimulation;Patient/family education;Stair training;UE/LE Coordination activities;Wheelchair propulsion/positioning;Visual/perceptual remediation/compensation   Pt received supine in bed and agreeable to therapy session. Supine>sitting R EOB, HOB flat and not using bedrail, independently. Reassessed strength and sensation in preparation for upcoming D/C. Discussed follow-up recommendations with pt stating he would prefer OPPT as was recommended rather than HHPT and states he misunderstood the SW - sent chat message to SW updating her on pt's preference. Donned socks and tennis shoes total assist. Sit>stand EOB>RW with continued mod cuing for improved biomechanics to decrease compensation with B UEs - educated pt on primary components being getting his feet back underneath his BOS enough and bringing his trunk forward. Gait over to sink using RW with supervision. Initiated hygiene tasks at sink standing with RW but then had to transition to sitting after ~48m44mtes due to pt having LE standing endurance deficits and due to R knee discomfort.  Gait training ~180f75f main therapy gym using RW with supervision and therapist bringing w/c in event of fatigue but not used - slow gait speed, narrow BOS with slight excessive R LE adduction, R LE slightly externally rotated (pt reports this is baseline), forward trunk flexed  posture with R lateral trunk flexion, decreased B LE step lengths (R smaller than L), and lack of knee flexion during swing phase having slight circumduction.   Stair navigation training ascending/descending  12 steps (6" height) using B HRs with supervision assist - step-to pattern leading with L LE on ascent and R LE on descent with pt reporting feeling more confident in stair navigation after every trial. Discussed safety on stairs and having his wife manage carrying his AD up/down the stairs for him.  Discussed performing HEP on the bed at home, pt initially stating he plans to get in the floor, but educated on needing to practice a floor transfer before doing that and pt declines participating in that at this time.   Transported back to his room and pt left seated in w/c with needs in reach and seat belt alarm on.  Therapy Documentation Precautions:  Precautions Precautions: Fall Restrictions Weight Bearing Restrictions: No   Pain:  Chronic R knee pain - no limitations during session.   Therapy/Group: Individual Therapy  Tawana Scale , PT, DPT, NCS, CSRS 09/07/2022, 7:48 AM

## 2022-09-08 NOTE — Progress Notes (Signed)
PROGRESS NOTE   Subjective/Complaints:  Wife at bedside discussing pro and cons of outpt vs HH therapy.  Pt will not be driving immediately post d/c , wife is unable to transport pt   Discussed CVA location with associated deficits   Review of systems negative CP, SOB,N/V/D  Objective:   No results found. No results for input(s): "WBC", "HGB", "HCT", "PLT" in the last 72 hours.  Recent Labs    09/06/22 0608  NA 131*  K 4.3  CL 101  CO2 23  GLUCOSE 90  BUN 23  CREATININE 1.35*  CALCIUM 9.3     Intake/Output Summary (Last 24 hours) at 09/08/2022 1359 Last data filed at 09/08/2022 1304 Gross per 24 hour  Intake 958 ml  Output 850 ml  Net 108 ml         Physical Exam: Vital Signs Blood pressure (!) 155/69, pulse (!) 57, temperature 98.3 F (36.8 C), temperature source Oral, resp. rate 18, height 6' (1.829 m), weight 95.3 kg, SpO2 100 %.   General: No acute distress Mood and affect are appropriate Heart: Regular rate and rhythm no rubs murmurs or extra sounds Lungs: Clear to auscultation, breathing unlabored, no rales or wheezes, good air movement Abdomen: normoactive BS, soft nontender to palpation, non-distended Extremities: No clubbing, cyanosis, or edema Skin: No evidence of breakdown, no evidence of rash Neurologic: Cranial nerves II through XII grossly intact, motor strength is 5/5 in bilateral deltoid, bicep, tricep, grip, hip flexor, knee extensors, ankle dorsiflexor and plantar flexor  Cerebellar exam normal finger to nose to finger as well as heel to shin in bilateral upper and lower extremities Musculoskeletal:RIght knee effusion noted suprapatellar bursa, minimal right knee joint line tenderness  Assessment/Plan: 1. Functional deficits which require 3+ hours per day of interdisciplinary therapy in a comprehensive inpatient rehab setting. Physiatrist is providing close team supervision and 24  hour management of active medical problems listed below. Physiatrist and rehab team continue to assess barriers to discharge/monitor patient progress toward functional and medical goals  Care Tool:  Bathing    Body parts bathed by patient: Right arm, Left arm, Chest, Abdomen, Front perineal area, Buttocks, Right upper leg, Left upper leg, Face, Left lower leg, Right lower leg         Bathing assist Assist Level: Contact Guard/Touching assist     Upper Body Dressing/Undressing Upper body dressing   What is the patient wearing?: Pull over shirt    Upper body assist Assist Level: Set up assist    Lower Body Dressing/Undressing Lower body dressing      What is the patient wearing?: Underwear/pull up, Pants     Lower body assist Assist for lower body dressing: Contact Guard/Touching assist     Toileting Toileting    Toileting assist Assist for toileting: Contact Guard/Touching assist     Transfers Chair/bed transfer  Transfers assist     Chair/bed transfer assist level: Contact Guard/Touching assist     Locomotion Ambulation   Ambulation assist      Assist level: Contact Guard/Touching assist Assistive device: Walker-rolling Max distance: 100'   Walk 10 feet activity   Assist     Assist  level: Contact Guard/Touching assist Assistive device: Walker-rolling   Walk 50 feet activity   Assist    Assist level: Contact Guard/Touching assist Assistive device: Walker-rolling    Walk 150 feet activity   Assist Walk 150 feet activity did not occur: Safety/medical concerns (fatigue)         Walk 10 feet on uneven surface  activity   Assist Walk 10 feet on uneven surfaces activity did not occur: Safety/medical concerns         Wheelchair     Assist Is the patient using a wheelchair?: Yes Type of Wheelchair: Manual    Wheelchair assist level: Moderate Assistance - Patient 50 - 74% Max wheelchair distance: 75'    Wheelchair 50  feet with 2 turns activity    Assist        Assist Level: Moderate Assistance - Patient 50 - 74%   Wheelchair 150 feet activity     Assist      Assist Level: Maximal Assistance - Patient 25 - 49%   Blood pressure (!) 155/69, pulse (!) 57, temperature 98.3 F (36.8 C), temperature source Oral, resp. rate 18, height 6' (1.829 m), weight 95.3 kg, SpO2 100 %.  Medical Problem List and Plan: 1. Functional deficits secondary to R ponto-cerebellar CVA             -patient may  shower             -ELOS/Goals: 09/10/22 mod I, CGA car transfers             - Primary barriers: balance and coordination deficits, R knee pain, 5 STE home    2. New diagnosis segmental PE/Antithrombotics: -DVT/anticoagulation:  Pharmaceutical: Eliquis- at least 72mo            -antiplatelet therapy: N/A (ASA after DOAC completed)  3. Pain Management: oxycodone prn for knee pain.              - Pre-medicate AM therapies to allow participation             - See management of R knee effusion below 4. Mood/Behavior/Sleep: LCSW to follow for evaluation and support.              --Trazodone prn for sleep.              -antipsychotic agents: N/A 5. Neuropsych/cognition: This patient is capable of making decisions on his own behalf. 6. Skin/Wound Care: Routine pressure relief measures.  7. Fluids/Electrolytes/Nutrition: Monitor I/O. Check CMET in am.  8. HTN: Monitor BP TID- continue    09/08/2022    6:50 AM 09/08/2022    5:04 AM 09/07/2022   11:10 PM  Vitals with BMI  Systolic [id number] Diastolic 69 73 54  Pulse  57      mild systolic elevation  On  clonidine, avapro, hydralazine, spironolactone 1/19 BP well-controlled overall 9. CKD 3A: Baseline SCr 1.3-1.4. Reports mild dysuria since admission. Encourage fluid intake.  BUN/Cr improved 1/12 vs 1/11 1/19 CR stable at 1.35 continue to monitor 10. OSA: Continue CPAP 11. Prostate cancer s/p prostatectomy: 12. Right knee OA/effusion: Used to get  his knee injected every 6 months and missed an appt last week. --Add voltaren gel QID with aquatherapy prn.  --Oxycodone prn pain--may need premedication.  --consult ortho for aspiration. Pending results, would consider steroid injection (was due for OP bilateral injections prior to admission) -1/19 reports pain controlled with Voltaren gel 13. Hyponatremia: Borderline low. Continue  to monitor sodium levels. Stable valus 1/12 vs 1/11  1/17 Na stable 132  1/19 NA stable at 131 14. Acute blood loss anemia: Recheck in am  1/17 HGB stable at 12.3  15. Constipation: likely cause of abd discomfort, mild nausea no vomiting  --results with sorbitol but still has abd discomfort will check KUB -1/17 abdominal xray negative, simethicone and scopolamine started yesterday 1/18 sorbitol ordered 1/19 constipation improved after 2 BMs      LOS: 10 days A FACE TO FACE EVALUATION WAS PERFORMED  Charlett Blake 09/08/2022, 1:59 PM

## 2022-09-08 NOTE — Progress Notes (Signed)
Patient has home unit CPAP at bedside.  Water chamber refilled and patient states will place on self when ready.

## 2022-09-09 ENCOUNTER — Other Ambulatory Visit (HOSPITAL_COMMUNITY): Payer: Self-pay

## 2022-09-09 DIAGNOSIS — D62 Acute posthemorrhagic anemia: Secondary | ICD-10-CM

## 2022-09-09 DIAGNOSIS — K30 Functional dyspepsia: Secondary | ICD-10-CM

## 2022-09-09 LAB — CBC WITH DIFFERENTIAL/PLATELET
Abs Immature Granulocytes: 0.02 10*3/uL (ref 0.00–0.07)
Basophils Absolute: 0.1 10*3/uL (ref 0.0–0.1)
Basophils Relative: 2 %
Eosinophils Absolute: 0.2 10*3/uL (ref 0.0–0.5)
Eosinophils Relative: 3 %
HCT: 35.4 % — ABNORMAL LOW (ref 39.0–52.0)
Hemoglobin: 11.9 g/dL — ABNORMAL LOW (ref 13.0–17.0)
Immature Granulocytes: 0 %
Lymphocytes Relative: 42 %
Lymphs Abs: 2 10*3/uL (ref 0.7–4.0)
MCH: 28 pg (ref 26.0–34.0)
MCHC: 33.6 g/dL (ref 30.0–36.0)
MCV: 83.3 fL (ref 80.0–100.0)
Monocytes Absolute: 0.4 10*3/uL (ref 0.1–1.0)
Monocytes Relative: 8 %
Neutro Abs: 2.1 10*3/uL (ref 1.7–7.7)
Neutrophils Relative %: 45 %
Platelets: 370 10*3/uL (ref 150–400)
RBC: 4.25 MIL/uL (ref 4.22–5.81)
RDW: 11.6 % (ref 11.5–15.5)
WBC: 4.8 10*3/uL (ref 4.0–10.5)
nRBC: 0 % (ref 0.0–0.2)

## 2022-09-09 LAB — BASIC METABOLIC PANEL
Anion gap: 5 (ref 5–15)
BUN: 19 mg/dL (ref 8–23)
CO2: 24 mmol/L (ref 22–32)
Calcium: 9.1 mg/dL (ref 8.9–10.3)
Chloride: 101 mmol/L (ref 98–111)
Creatinine, Ser: 1.3 mg/dL — ABNORMAL HIGH (ref 0.61–1.24)
GFR, Estimated: 57 mL/min — ABNORMAL LOW (ref >60)
Glucose, Bld: 92 mg/dL (ref 70–99)
Potassium: 4.2 mmol/L (ref 3.5–5.1)
Sodium: 130 mmol/L — ABNORMAL LOW (ref 135–145)

## 2022-09-09 MED ORDER — SORBITOL 70 % SOLN
30.0000 mL | Freq: Once | Status: AC
  Administered 2022-09-09: 30 mL via ORAL
  Filled 2022-09-09: qty 30

## 2022-09-09 MED ORDER — HYDRALAZINE HCL 10 MG PO TABS
10.0000 mg | ORAL_TABLET | Freq: Two times a day (BID) | ORAL | 0 refills | Status: DC
  Filled 2022-09-09: qty 60, 30d supply, fill #0

## 2022-09-09 MED ORDER — SCOPOLAMINE 1 MG/3DAYS TD PT72
1.0000 | MEDICATED_PATCH | TRANSDERMAL | 0 refills | Status: DC
  Filled 2022-09-09: qty 10, 30d supply, fill #0

## 2022-09-09 MED ORDER — DICLOFENAC SODIUM 1 % EX GEL
2.0000 g | Freq: Four times a day (QID) | CUTANEOUS | 0 refills | Status: DC
  Filled 2022-09-09: qty 100, 15d supply, fill #0

## 2022-09-09 MED ORDER — SENNOSIDES-DOCUSATE SODIUM 8.6-50 MG PO TABS
3.0000 | ORAL_TABLET | Freq: Every day | ORAL | 0 refills | Status: DC
  Filled 2022-09-09: qty 90, 30d supply, fill #0

## 2022-09-09 MED ORDER — POLYETHYLENE GLYCOL 3350 17 GM/SCOOP PO POWD
17.0000 g | Freq: Two times a day (BID) | ORAL | 0 refills | Status: DC
  Filled 2022-09-09: qty 238, 7d supply, fill #0

## 2022-09-09 MED ORDER — BISMUTH SUBSALICYLATE 262 MG/15ML PO SUSP
30.0000 mL | ORAL | Status: DC | PRN
  Filled 2022-09-09: qty 236

## 2022-09-09 MED ORDER — HYDRALAZINE HCL 10 MG PO TABS: 10.0000 mg | ORAL_TABLET | Freq: Three times a day (TID) | ORAL | Status: DC

## 2022-09-09 MED ORDER — PRAVASTATIN SODIUM 80 MG PO TABS
80.0000 mg | ORAL_TABLET | Freq: Every day | ORAL | 0 refills | Status: DC
  Filled 2022-09-09: qty 30, 30d supply, fill #0

## 2022-09-09 MED ORDER — APIXABAN 5 MG PO TABS
5.0000 mg | ORAL_TABLET | Freq: Two times a day (BID) | ORAL | 0 refills | Status: DC
  Filled 2022-09-09: qty 60, 30d supply, fill #0

## 2022-09-09 MED ORDER — OXYCODONE HCL 5 MG PO TABS
5.0000 mg | ORAL_TABLET | ORAL | 0 refills | Status: DC | PRN
  Filled 2022-09-09: qty 20, 4d supply, fill #0

## 2022-09-09 MED ORDER — SPIRONOLACTONE 25 MG PO TABS
ORAL_TABLET | ORAL | 0 refills | Status: DC
  Filled 2022-09-09: qty 30, fill #0

## 2022-09-09 MED ORDER — CLONIDINE HCL 0.3 MG PO TABS
0.3000 mg | ORAL_TABLET | Freq: Two times a day (BID) | ORAL | 0 refills | Status: DC
  Filled 2022-09-09: qty 60, 30d supply, fill #0

## 2022-09-09 MED ORDER — TRAZODONE HCL 50 MG PO TABS
25.0000 mg | ORAL_TABLET | Freq: Every evening | ORAL | 0 refills | Status: DC | PRN
  Filled 2022-09-09: qty 15, 15d supply, fill #0

## 2022-09-09 MED ORDER — SODIUM CHLORIDE 1 G PO TABS: 1.0000 g | ORAL_TABLET | Freq: Two times a day (BID) | ORAL | Status: DC

## 2022-09-09 MED ORDER — HYDRALAZINE HCL 10 MG PO TABS
10.0000 mg | ORAL_TABLET | Freq: Two times a day (BID) | ORAL | Status: DC
  Administered 2022-09-09 – 2022-09-10 (×2): 10 mg via ORAL
  Filled 2022-09-09 (×2): qty 1

## 2022-09-09 MED ORDER — SODIUM CHLORIDE 1 G PO TABS
1.0000 g | ORAL_TABLET | Freq: Two times a day (BID) | ORAL | 0 refills | Status: DC
  Filled 2022-09-09: qty 60, 30d supply, fill #0

## 2022-09-09 MED ORDER — SODIUM CHLORIDE 1 G PO TABS
1.0000 g | ORAL_TABLET | Freq: Two times a day (BID) | ORAL | Status: DC
  Administered 2022-09-09 – 2022-09-10 (×3): 1 g via ORAL
  Filled 2022-09-09 (×3): qty 1

## 2022-09-09 NOTE — Progress Notes (Signed)
Physical Therapy Session Note  Patient Details  Name: Jared Cline MRN: [id number] Date of Birth: [birth year]  Today's Date: 09/09/2022 PT Individual Time: 1102-1159 PT Individual Time Calculation (min): 57 min   Short Term Goals: Week 2:  PT Short Term Goal 1 (Week 2): = to LTGs based on ELOS  Skilled Therapeutic Interventions/Progress Updates:      Pt sitting in w/c with wife present for scheduled family education and training. Pt does not report of pain during treatment. Wife present throughout session for active observation, no hands on needed due to patient's functional status.   Lengthy discussion to start re: DME rec's, f/u therapies (HH), fall prevention, home safety, primary deficits related to CVA, general stroke recovery, etc. Reviewed recommendations for intermittent supervision/assist and for use of RW at all times while mobilizing. All questions/concerns addressed. Wife reports the plan is for non-emergency medical transport home from [facility].   Reviewed car transfer with car height simulating large SUV - patient completed at setupA level, with assist needed for RW management - no cues needed as patient demonstrated appropriate safety awareness and ability to manage LE in/out without physical assist.   Reviewed gait training with RW on level surfaces with patient being at mod I level. Needing supervision for unlevel surfaces such as the ramp. Discussed gaze stabilization, limiting distractions, and reducing falls risk with his environment.  Reviewed stairs with 6inch steps and 2 hand rails within reach - wife confirms the 2 rails at home are within reach. Pt navigated up/down x12 steps with supervision - pt completed while forward facing with step-to pattern. Pt safe, cautious, and understanding how how to navigate stairs.  BERG balance testing completed with results outlined below. Seated rest breaks needed during testing.   Patient demonstrates increased fall risk as noted by score  of   23/56 on Berg Balance Scale.  (<36= high risk for falls, close to 100%; 37-45 significant >80%; 46-51 moderate >50%; 52-55 lower >25%)  Returned to his room and NT changing bed linen - pt agreeable to stay in w/c. All needs met at end of session.  Therapy Documentation Precautions:  Precautions Precautions: Fall Restrictions Weight Bearing Restrictions: No General:    Balance: Balance Balance Assessed: Yes Standardized Balance Assessment Standardized Balance Assessment: Berg Balance Test Berg Balance Test Sit to Stand: Able to stand  independently using hands Standing Unsupported: Able to stand 2 minutes with supervision Sitting with Back Unsupported but Feet Supported on Floor or Stool: Able to sit safely and securely 2 minutes Stand to Sit: Controls descent by using hands Transfers: Able to transfer safely, definite need of hands Standing Unsupported with Eyes Closed: Able to stand 3 seconds Standing Ubsupported with Feet Together: Needs help to attain position but able to stand for 30 seconds with feet together From Standing, Reach Forward with Outstretched Arm: Loses balance while trying/requires external support From Standing Position, Pick up Object from Floor: Unable to try/needs assist to keep balance From Standing Position, Turn to Look Behind Over each Shoulder: Needs supervision when turning Turn 360 Degrees: Needs close supervision or verbal cueing Standing Unsupported, Alternately Place Feet on Step/Stool: Needs assistance to keep from falling or unable to try Standing Unsupported, One Foot in Front: Able to take small step independently and hold 30 seconds Standing on One Leg: Unable to try or needs assist to prevent fall Total Score: 23/56   Therapy/Group: Individual Therapy  Aryel Edelen P Karter Haire PT 09/09/2022, 11:45 AM

## 2022-09-09 NOTE — Progress Notes (Signed)
Occupational Therapy Discharge Summary  Patient Details  Name: Jared Cline MRN: [id number] Date of Birth: 1945-12-29  Date of Discharge from OT service:September 09, 2022  Today's Date: 09/09/2022 OT Individual Time: 1000-1100 OT Individual Time Calculation (min): 60 min  OT Individual Time: 1445-1530 OT Individual Time Calculation (min): 45 min   Patient has met 9 of 9 long term goals due to improved activity tolerance, improved balance, postural control, ability to compensate for deficits, improved awareness, and improved coordination.  Patient to discharge at overall Modified Independent level.  Patient's care partner is independent to provide the necessary physical and cognitive assistance at discharge.    Reasons goals not met: All goals met  Recommendation:  Patient will benefit from ongoing skilled OT services in home health setting to continue to advance functional skills in the area of BADL, iADL, and Reduce care partner burden.  Equipment: Tub bench and BSC  Reasons for discharge: treatment goals met and discharge from hospital  Patient/family agrees with progress made and goals achieved: Yes  OT Discharge AM Session: Pt received sitting up in wc with wife present presenting to be in good spirits and receptive to skilled OT session. Pt reporting 0/10 dizziness and pain at beginning of session with dizziness mildly increasing during standing- able to resolve with standing or sitting rest break and visual fixation. Pt and wife educated on impact and location of CVA as well as recovery process. Wife updated on Pt current functional status providing supervision/SBA with BADLs during session today. Pt able to complete shower transfer <> tub bench with set-up A with wife and Pt re-educated on technique. Pt and wife educated on fall prevention and energy conversation strategies to implement at d/c to increase Pt safety with both individuals receptive to education. Wife educated on need to  have intermittent supervision at d/c to increase safety during IADL tasks. Follow-up HHOT services recommended d/t Pt unable to get transportation to/from [facility] appointments. Pt was handed off to PT with all questions and immediate needs met.   PM Session: Pt received sitting up in wc with wife present in room. Wife leaving shortly after OT arrival reporting all her questions had been met and she feel prepared for Pt to return home tomorrow. Focus this session home HEP for visual fixation and functional endurance training. Pt re-educated on visual fixation exercises with handout and written directions for exercises provided to increase carry over. Pt completed exercises as evidence of learning with skilled feedback provided on technique. Pt receptive of HEP and motivated to complete at home. Pt transported total A to therapy gym. Pt completed 8 minutes of endurance and full body strength training on NuStep at level 4 setting. Pt able to tolerate activity well with rest break provided following. Pt transported back to room total A in wc and left resting in wc with call bell in reach, chair alarm on, and all needs met.   Precautions/Restrictions  Precautions Precautions: Fall Restrictions Weight Bearing Restrictions: No General   Vital Signs Therapy Vitals Temp: 97.7 F (36.5 C) Temp Source: Oral Pulse Rate: (Abnormal) 59 BP: (Abnormal) 122/58 Oxygen Therapy SpO2: 100 % Pain: No pain reported    ADL ADL Eating: Independent Where Assessed-Eating: Edge of bed Grooming: Independent Where Assessed-Grooming: Standing at sink Upper Body Bathing: Setup Where Assessed-Upper Body Bathing: Shower Lower Body Bathing: Setup Where Assessed-Lower Body Bathing: Shower Upper Body Dressing: Independent Where Assessed-Upper Body Dressing: Edge of bed Lower Body Dressing: Modified independent Where Assessed-Lower Body Dressing: [employer]  of bed Toileting: Modified independent Where Assessed-Toileting:  Toilet, Bedside Commode Toilet Transfer: Modified independent Armed forces technical officer Method: Counselling psychologist: Grab bars, Raised toilet seat Tub/Shower Transfer: Distant supervision Tub/Shower Transfer Method: Optometrist: Facilities manager: Distant supervision Social research officer, government Method: Heritage manager: Gaffer Baseline Vision/History: 1 Wears glasses Patient Visual Report: No change from baseline Vision Assessment?: No apparent visual deficits Eye Alignment: Within Functional Limits Ocular Range of Motion: Within Functional Limits Alignment/Gaze Preference: Within Defined Limits Tracking/Visual Pursuits: Able to track stimulus in all quads without difficulty Saccades: Within functional limits Convergence: Within functional limits Visual Fields: No apparent deficits Perception  Perception: Within Functional Limits Inattention/Neglect: Impaired-to be further tested in functional context;Does not attend to right side of body Praxis Praxis: Intact Cognition Cognition Overall Cognitive Status: Within Functional Limits for tasks assessed Arousal/Alertness: Awake/alert Orientation Level: Person;Place;Situation Person: Oriented Place: Oriented Situation: Oriented Memory: Appears intact Attention: Focused;Sustained;Selective Focused Attention: Appears intact Sustained Attention: Appears intact Selective Attention: Appears intact Awareness: Appears intact Problem Solving: Appears intact Safety/Judgment: Appears intact Brief Interview for Mental Status (BIMS) Repetition of Three Words (First Attempt): 3 Temporal Orientation: Year: Correct Temporal Orientation: Month: Accurate within 5 days Temporal Orientation: Day: Correct Recall: "Sock": Yes, no cue required Recall: "Blue": Yes, no cue required Recall: "Bed": Yes, no cue required BIMS Summary Score:  15 Sensation Sensation Light Touch: Appears Intact Hot/Cold: Not tested Proprioception: Appears Intact Stereognosis: Appears Intact Coordination Gross Motor Movements are Fluid and Coordinated: No Fine Motor Movements are Fluid and Coordinated: Yes Motor  Motor Motor: Within Functional Limits Mobility  Bed Mobility Bed Mobility: Supine to Sit;Sit to Supine Supine to Sit: Independent Sit to Supine: Independent Transfers Sit to Stand: Independent with assistive device Stand to Sit: Independent with assistive device  Trunk/Postural Assessment  Cervical Assessment Cervical Assessment: Within Functional Limits Thoracic Assessment Thoracic Assessment: Within Functional Limits Lumbar Assessment Lumbar Assessment: Within Functional Limits Postural Control Postural Control: Deficits on evaluation Trunk Control: posterior bias in unsupported standing  Balance Balance Balance Assessed: Yes Static Sitting Balance Static Sitting - Balance Support: Feet supported;No upper extremity supported Static Sitting - Level of Assistance: 7: Independent Dynamic Sitting Balance Dynamic Sitting - Balance Support: Feet supported;No upper extremity supported Dynamic Sitting - Level of Assistance: 7: Independent Static Standing Balance Static Standing - Balance Support: Bilateral upper extremity supported Static Standing - Level of Assistance: 6: Modified independent (Device/Increase time) Dynamic Standing Balance Dynamic Standing - Balance Support: Bilateral upper extremity supported;During functional activity Dynamic Standing - Level of Assistance: 6: Modified independent (Device/Increase time);5: Stand by assistance Extremity/Trunk Assessment RUE Assessment RUE Assessment: Within Functional Limits General Strength Comments: 100 lbs of grip strength LUE Assessment LUE Assessment: Within Functional Limits General Strength Comments: 95 lbs of grip strength   Janey Genta 09/09/2022, 3:56  PM

## 2022-09-09 NOTE — Discharge Summary (Signed)
Physician Discharge Summary  Patient ID: Jared Cline MRN: [id number] DOB/AGE: [birth year] 77 y.o.  Admit date: 08/29/2022 Discharge date: 09/10/2022  Discharge Diagnoses:  Principal Problem:   Cerebellar stroke [facility]) Active Problems:   Hypertension   Stage 3a chronic kidney disease ([place])   History of prostate cancer   OSA on CPAP   Dizziness   Osteoarthritis of knees, bilateral   Hyponatremia   Constipation   Discharged Condition: stable  Significant Diagnostic Studies: DG Abd 1 View  Result Date: 09/03/2022 CLINICAL DATA:  Abdominal pain. EXAM: ABDOMEN - 1 VIEW COMPARISON:  None Available. FINDINGS: Visualized bowel gas pattern is nonobstructive. No evidence of free intraperitoneal air. No evidence of soft tissue mass or abnormal fluid collection. No evidence of renal or ureteral calculi. Lung bases appear clear. IMPRESSION: No acute findings.  Grossly nonobstructive bowel gas pattern. Electronically Signed   By: Franki Cabot M.D.   On: 09/03/2022 09:31   DG Knee 1-2 Views Right  Result Date: 08/27/2022 CLINICAL DATA:  77 year old male with knee pain and swelling. EXAM: RIGHT KNEE - 1-2 VIEW COMPARISON:  No prior knee series. FINDINGS: AP and cross-table lateral views. Bulky tricompartmental degeneration with severe lateral compartment joint space loss. Near bone on bone appearance there. Bulky degenerative spurring. Small to moderate suprapatellar joint effusion. No acute osseous abnormality identified. Calcified atherosclerosis posterior to the knee. IMPRESSION: Bulky tricompartmental degeneration, most severe in the lateral compartment. Small to moderate joint effusion. No acute osseous abnormality identified. Electronically Signed   By: Genevie Ann M.D.   On: 08/27/2022 12:25    Labs:  Basic Metabolic Panel: Recent Labs  Lab 09/06/22 0608 09/09/22 0611 09/10/22 0810  NA 131* 130* 133*  K 4.3 4.2 4.3  CL 101 101 102  CO2 '23 24 26  '$ GLUCOSE 90 92 121*  BUN '23 19 16  '$ CREATININE  1.35* 1.30* 1.31*  CALCIUM 9.3 9.1 9.2    CBC:    Latest Ref Rng & Units 09/09/2022    6:11 AM 09/02/2022    6:27 AM 08/30/2022    5:56 AM  CBC  WBC 4.0 - 10.5 K/uL 4.8  6.6  5.1   Hemoglobin 13.0 - 17.0 g/dL 11.9  12.3  12.3   Hematocrit 39.0 - 52.0 % 35.4  37.3  37.0   Platelets 150 - 400 K/uL 370  425  336      CBG: No results for input(s): "GLUCAP" in the last 168 hours.  Brief HPI:   Jared Cline is a 77 y.o. male with history of BPH, CKD, severe OA bilateral knees who was recently admitted to [facility] on 08/24/2022 with falls x 2, dizziness, weakness and inability to stand.  He was found to have segmental PE in RLL and LLL.  MRI brain revealed acute punctate infarct posterior inferior pons and right cerebellar peduncle.  MRA revealed extensive intracranial atherosclerosis with advanced mid basilar and bilateral PCA branch stenosis, occluded left VA and moderate right-M1 segment stenosis as well as 50% narrowing proximal left-ICA.  He was started on treatment dose Lovenox and transition to [place].  Dr. Quinn Axe recommended transition to aspirin once DOAC continued and to follow-up with neurology after discharge.  PT/ OT has been working with patient and patient was limited by right knee pain, balance deficits, dizziness as well as right ataxia.  CIR was recommended due to functional decline   Hospital Course: Jared Cline was admitted to rehab 08/29/2022 for inpatient therapies to consist of PT and OT at  least three hours five days a week. Past admission physiatrist, therapy team and rehab RN have worked together to provide customized collaborative inpatient rehab. He continues on DOAC with follow up CBC showed mild drop in Hgb to 11.9 and recommend following CBC for stability. His blood pressures were monitored on TID basis and has been stable overall. At admission, he was limited by right knee pain as well as vestibular issues with dizziness. Voltaren gel was added for local measures and oxycodone  used in for pre-medication prior to therapy sessions. Vestibular symptoms have improved addition of scopolamine patch.  Acute on chronic constipation has been managed with titrated of laxative. He has also been educated on increasing fiber in his diet, weaning of oxycodone as well as increasing fluid intake. He has been compliant with CPAP use. Po intake has been good and he's continent of B/B. Admission labs revealed hyponatremia with sodium down to 130. This has been monitored and continued to be low therefore salt tabs added prior to discharge with improvement in Na to 132 and chronic kidney disease is stable with SCr around 1.3. Recommend repeat BMET in 1-2 weeks to monitor sodium level.  He has made good gains during his rehab stay and modified independent at discharge. He will continue to receive follow up HHPT and [place] [facility] after discharge.    Rehab course: During patient's stay in rehab weekly team conferences were held to monitor patient's progress, set goals and discuss barriers to discharge. At admission, patient required min assist with basic ADL tasks and with mobility.  He  has had improvement in activity tolerance, balance, postural control as well as ability to compensate for deficits. He is able to complete ADL tasks at modified independent level. He is independent for transfers and to ambulate 150' with RW. He is able to climb 12 stairs with supervision. Family education has been completed.    Disposition: Home  Diet: Heart healthy.   Special Instructions: No driving or strenuous activity till cleared by MD. Recommend repeat CBC/BMET in a couple of weeks to monitor H/H and Na level.   Allergies as of 09/10/2022       Reactions   Flomax [tamsulosin Hcl] Shortness Of Breath   Ace Inhibitors Other (See Comments)   Angioedema   Amlodipine Other (See Comments)   Angioedema   Doxazosin    Lipitor [atorvastatin] Other (See Comments)   Memory loss         Medication List     STOP taking these medications    ascorbic acid 500 MG tablet Commonly known as: VITAMIN C   magnesium oxide 400 MG tablet Commonly known as: MAG-OX   meclizine 25 MG tablet Commonly known as: ANTIVERT   ondansetron 4 MG disintegrating tablet Commonly known as: ZOFRAN-ODT   polyethylene glycol 17 g packet Commonly known as: MIRALAX / GLYCOLAX Replaced by: polyethylene glycol powder 17 GM/SCOOP powder       TAKE these medications    acetaminophen 650 MG CR tablet Commonly known as: TYLENOL Take 2 tablets (1,300 mg total) by mouth every 12 (twelve) hours as needed (Mild pain).   cholecalciferol 25 MCG (1000 UNIT) tablet Commonly known as: VITAMIN D3 Take 1,000 Units by mouth daily.   cloNIDine 0.3 MG tablet Commonly known as: CATAPRES Take 1 tablet (0.3 mg total) by mouth 2 (two) times daily. What changed:  medication strength how much to take additional instructions   cyanocobalamin 1000 MCG tablet Commonly known as: VITAMIN  B12 Take 1,000 mcg by mouth daily.   diclofenac Sodium 1 % Gel Commonly known as: VOLTAREN Apply 2 g topically 4 (four) times daily. Notes to patient: 3-4 times a day for knee pain   Eliquis 5 MG Tabs tablet Generic drug: apixaban Take 1 tablet (5 mg total) by mouth 2 (two) times daily. What changed: Another medication with the same name was removed. Continue taking this medication, and follow the directions you see here.   hydrALAZINE 10 MG tablet Commonly known as: APRESOLINE Take 1 tablet (10 mg total) by mouth 2 (two) times daily. What changed: when to take this   olmesartan 20 MG tablet Commonly known as: BENICAR Take 20 mg by mouth daily.   oxyCODONE 5 MG immediate release tablet--Rx# 20 pills. Commonly known as: Oxy IR/ROXICODONE Take 1 tablet (5 mg total) by mouth every 4 (four) hours as needed for severe pain. What changed:  when to take this reasons to take this   polyethylene glycol powder 17  GM/SCOOP powder Commonly known as: GLYCOLAX/MIRALAX Take 1 capful (17 g) by mouth 2 (two) times daily. Replaces: polyethylene glycol 17 g packet   pravastatin 80 MG tablet Commonly known as: PRAVACHOL Take 1 tablet (80 mg total) by mouth at bedtime.   scopolamine 1 MG/3DAYS Commonly known as: TRANSDERM-SCOP Place 1 patch (1.5 mg total) onto the skin every 3 (three) days.   Senexon-S 8.6-50 MG tablet Generic drug: senna-docusate Take 3 tablets by mouth daily at 6 (six) AM. What changed:  how much to take when to take this   sodium chloride 1 g tablet Take 1 tablet (1 g total) by mouth 2 (two) times daily with a meal.   spironolactone 25 MG tablet Commonly known as: ALDACTONE TAKE 1 TABLET(25 MG) BY MOUTH DAILY   traZODone 50 MG tablet Commonly known as: DESYREL Take 0.5-1 tablets (25-50 mg total) by mouth at bedtime as needed for sleep.        Follow-up Information     Bo Merino, FNP Follow up.   Specialty: Nurse Practitioner Why: Call in 1-2 days for post hospital follow up Contact information: [address] [place] [address] [phone]         Charlett Blake, MD Follow up.   Specialty: Physical Medicine and Rehabilitation Why: office will call you with follow up appointment Contact information: [place] [address] [phone]         Anabel Bene, MD Follow up.   Specialty: Neurology Why: Call in 1-2 days for post hospital follow up Contact information: [place] [facility] [place] [address] [phone]         Vladimir Crofts, MD Follow up.   Specialty: Neurology Why: Can set stroke follow up with Dr. Manuella Ghazi or Dr. Melrose Nakayama. Contact information: [facility] [place] [address] [phone]                 Signed: Bary Leriche 09/10/2022, 5:25 PM

## 2022-09-09 NOTE — Progress Notes (Signed)
Physical Therapy Session Note  Patient Details  Name: Jared Cline MRN: [id number] Date of Birth: 15-Oct-1945  Today's Date: 09/09/2022 PT Individual Time: 1300-1400 PT Individual Time Calculation (min): 60 min   Short Term Goals: Week 2:  PT Short Term Goal 1 (Week 2): = to LTGs based on ELOS  Skilled Therapeutic Interventions/Progress Updates:    Chart reviewed and pt agreeable to therapy. Pt received seated in WC with no c/o pain. Also of note, wife was present. Session focused on preparation for d/c and balance training to continue progression towards independent mobility. Pt initiated session with discussion with PT regarding home set up and clarification of questions about access. Wife and pt verbalized comfortability with plan. Pt then assisted pt to toilet using SBA + RW. Pt then transferred to therapy gym for time conservation. In gym, pt completed balance exercises on foam consisting of neutral stance and feet together for 3 x60secs using CGA and VC to take hands off RW. Pt then completed 3x60secs of standing on flat surface with eyes closed and no hand support using CGA with periodic minA. Pt then completed 4 rounds of cornhole progressing from RW support to no AD with CGA. Pt then returned to room for final questions before d/c. Wife and pt verbalized feeling prepared for home access. At end of session, pt was left in [facility] with nurse call bell and all needs in reach.     Therapy Documentation Precautions:  Precautions Precautions: Fall Restrictions Weight Bearing Restrictions: No   Therapy/Group: Individual Therapy  Marquette Old, PT, DPT 09/09/2022, 2:20 PM

## 2022-09-09 NOTE — Progress Notes (Incomplete)
Inpatient Rehabilitation Discharge Medication Review by a Pharmacist   A complete drug regimen review was completed for this patient to identify any potential clinically significant medication issues.   High Risk Drug Classes Is patient taking? Indication by Medication  Antipsychotic No   Anticoagulant Yes Eliquis - PE  Antibiotic No    Opioid Yes Oxycodone - pain  Antiplatelet No    Hypoglycemics/insulin No    Vasoactive Medication Yes Clonidine, olmesartan, spironolactone, hydralazine - HTN  Chemotherapy No    Other Yes Trazodone - sleep Pravastatin - HLD Voltaren gel - pain Robitussin DM - cough Duonebs - shortness of breath/wheezing Miralax, Seno-S - constipation  Scopolamine patch - nausea NaCl tabs - supplementation        Type of Medication Issue Identified Description of Issue Recommendation(s)  Drug Interaction(s) (clinically significant)        Duplicate Therapy        Allergy        No Medication Administration End Date        Incorrect Dose        Additional Drug Therapy Needed        Significant med changes from prior encounter (inform family/care partners about these prior to discharge).      Other   PTA meds not yet resumed: HCTZ, ASA, vit C Consider resuming PTA medications upon discharge as appropriate  Resume ASA after Eliquis is completed      Clinically significant medication issues were identified that warrant physician communication and completion of prescribed/recommended actions by midnight of the next day:  No   Name of provider notified for urgent issues identified:    Provider Method of Notification:    Pharmacist comments:    Time spent performing this drug regimen review (minutes):  15

## 2022-09-09 NOTE — Progress Notes (Addendum)
Patient ID: Jared Cline, male   DOB: [birth year], 77 y.o.   MRN: [id number]  Patient spouse present for family education. HH confirmed with [facility]. Patient and spouse prefer [facility] transport tomorrow. Spouse will patient up patient belongings tomorrow. Contact information provide for [employer] to make co payments. No questions or concerns currently.

## 2022-09-09 NOTE — Progress Notes (Signed)
PROGRESS NOTE   Subjective/Complaints:  Pt reports some indigestion this Am. Reports pepto bismol has helped this in the past. No additional concerns or complaints.   Review of systems negative CP, SOB,N/V/D + indigestion  Objective:   No results found. Recent Labs    09/09/22 0611  WBC 4.8  HGB 11.9*  HCT 35.4*  PLT 370    Recent Labs    09/09/22 0611  NA 130*  K 4.2  CL 101  CO2 24  GLUCOSE 92  BUN 19  CREATININE 1.30*  CALCIUM 9.1     Intake/Output Summary (Last 24 hours) at 09/09/2022 0817 Last data filed at 09/08/2022 2028 Gross per 24 hour  Intake 360 ml  Output 550 ml  Net -190 ml         Physical Exam: Vital Signs Blood pressure (!) 150/59, pulse (!) 54, temperature 98.1 F (36.7 C), resp. rate 18, height 6' (1.829 m), weight 95.3 kg, SpO2 100 %.   General: No acute distress, sitting in WC Mood and affect are appropriate Heart: Regular rate and rhythm no rubs murmurs or extra sounds Lungs: Clear to auscultation, breathing unlabored, no rales or wheezes, good air movement Abdomen: normoactive BS, soft nontender to palpation, non-distended Extremities: No clubbing, cyanosis, or edema Skin: warm and dry Neurologic: Cranial nerves II through XII grossly intact, motor strength is 5/5 in bilateral deltoid, bicep, tricep, grip, hip flexor, knee extensors, ankle dorsiflexor and plantar flexor  Cerebellar exam normal finger to nose to finger as well as heel to shin in bilateral upper and lower extremities Musculoskeletal:RIght knee effusion noted suprapatellar bursa, minimal right knee joint line tenderness  Assessment/Plan: 1. Functional deficits which require 3+ hours per day of interdisciplinary therapy in a comprehensive inpatient rehab setting. Physiatrist is providing close team supervision and 24 hour management of active medical problems listed below. Physiatrist and rehab team continue  to assess barriers to discharge/monitor patient progress toward functional and medical goals  Care Tool:  Bathing    Body parts bathed by patient: Right arm, Left arm, Chest, Abdomen, Front perineal area, Buttocks, Right upper leg, Left upper leg, Face, Left lower leg, Right lower leg         Bathing assist Assist Level: Contact Guard/Touching assist     Upper Body Dressing/Undressing Upper body dressing   What is the patient wearing?: Pull over shirt    Upper body assist Assist Level: Set up assist    Lower Body Dressing/Undressing Lower body dressing      What is the patient wearing?: Underwear/pull up, Pants     Lower body assist Assist for lower body dressing: Contact Guard/Touching assist     Toileting Toileting    Toileting assist Assist for toileting: Contact Guard/Touching assist     Transfers Chair/bed transfer  Transfers assist     Chair/bed transfer assist level: Contact Guard/Touching assist     Locomotion Ambulation   Ambulation assist      Assist level: Contact Guard/Touching assist Assistive device: Walker-rolling Max distance: 100'   Walk 10 feet activity   Assist     Assist level: Contact Guard/Touching assist Assistive device: Walker-rolling   Walk 50  feet activity   Assist    Assist level: Contact Guard/Touching assist Assistive device: Walker-rolling    Walk 150 feet activity   Assist Walk 150 feet activity did not occur: Safety/medical concerns (fatigue)         Walk 10 feet on uneven surface  activity   Assist Walk 10 feet on uneven surfaces activity did not occur: Safety/medical concerns         Wheelchair     Assist Is the patient using a wheelchair?: Yes Type of Wheelchair: Manual    Wheelchair assist level: Moderate Assistance - Patient 50 - 74% Max wheelchair distance: 7'    Wheelchair 50 feet with 2 turns activity    Assist        Assist Level: Moderate Assistance -  Patient 50 - 74%   Wheelchair 150 feet activity     Assist      Assist Level: Maximal Assistance - Patient 25 - 49%   Blood pressure (!) 150/59, pulse (!) 54, temperature 98.1 F (36.7 C), resp. rate 18, height 6' (1.829 m), weight 95.3 kg, SpO2 100 %.  Medical Problem List and Plan: 1. Functional deficits secondary to R ponto-cerebellar CVA             -patient may  shower             -ELOS/Goals: 09/10/22 mod I, CGA car transfers             - Primary barriers: balance and coordination deficits, R knee pain, 5 STE home    2. New diagnosis segmental PE/Antithrombotics: -DVT/anticoagulation:  Pharmaceutical: Eliquis- at least 71mo            -antiplatelet therapy: N/A (ASA after DOAC completed)  3. Pain Management: oxycodone prn for knee pain.              - Pre-medicate AM therapies to allow participation             - See management of R knee effusion below 4. Mood/Behavior/Sleep: LCSW to follow for evaluation and support.              --Trazodone prn for sleep.              -antipsychotic agents: N/A 5. Neuropsych/cognition: This patient is capable of making decisions on his own behalf. 6. Skin/Wound Care: Routine pressure relief measures.  7. Fluids/Electrolytes/Nutrition: Monitor I/O. Check CMET in am.  8. HTN: Monitor BP TID- continue    09/09/2022    6:25 AM 09/09/2022    4:32 AM 09/08/2022    7:33 PM  Vitals with BMI  Systolic [id number] Diastolic 59 54 58  Pulse  54 67     mild systolic elevation  On  clonidine, avapro, hydralazine, spironolactone 1/22 occasional mild systolic elevation, continue to monitor 9. CKD 3A: Baseline SCr 1.3-1.4. Reports mild dysuria since admission. Encourage fluid intake.  BUN/Cr improved 1/12 vs 1/11 1/22 Cr 1.30 10. OSA: Continue CPAP 11. Prostate cancer s/p prostatectomy: 12. Right knee OA/effusion: Used to get his knee injected every 6 months and missed an appt last week. --Add voltaren gel QID with aquatherapy prn.   --Oxycodone prn pain--may need premedication.  --consult ortho for aspiration. Pending results, would consider steroid injection (was due for OP bilateral injections prior to admission) -1/19 reports pain controlled with Voltaren gel 13. Hyponatremia: Borderline low. Continue to monitor sodium levels. Stable valus 1/12 vs 1/11  1/17 Na stable 132  1/22 Na stable overall at 130 today, will add Na tabs  14. Acute blood loss anemia: Recheck in am  1/22 stable hgb 11.9  15. Constipation: likely cause of abd discomfort, mild nausea no vomiting  --results with sorbitol but still has abd discomfort will check KUB -1/17 abdominal xray negative, simethicone and scopolamine started yesterday 1/18 sorbitol ordered 1/19 constipation improved after 2 Bms 1/22 LBM 1/18, sorbitol 26m ordered  16 Indigestion  -1/22 PRN peptobismol      LOS: 11 days A FACE TO FACE EVALUATION WAS PERFORMED  YJennye Boroughs1/22/2024, 8:17 AM

## 2022-09-09 NOTE — Plan of Care (Signed)
  Problem: RH Balance Goal: LTG: Patient will maintain dynamic sitting balance (OT) Description: LTG:  Patient will maintain dynamic sitting balance with assistance during activities of daily living (OT) Outcome: Completed/Met Goal: LTG Patient will maintain dynamic standing with ADLs (OT) Description: LTG:  Patient will maintain dynamic standing balance with assist during activities of daily living (OT)  Outcome: Completed/Met   Problem: Sit to Stand Goal: LTG:  Patient will perform sit to stand in prep for activites of daily living with assistance level (OT) Description: LTG:  Patient will perform sit to stand in prep for activites of daily living with assistance level (OT) Outcome: Completed/Met   Problem: RH Bathing Goal: LTG Patient will bathe all body parts with assist levels (OT) Description: LTG: Patient will bathe all body parts with assist levels (OT) Outcome: Completed/Met   Problem: RH Dressing Goal: LTG Patient will perform upper body dressing (OT) Description: LTG Patient will perform upper body dressing with assist, with/without cues (OT). Outcome: Completed/Met   Problem: RH Toileting Goal: LTG Patient will perform toileting task (3/3 steps) with assistance level (OT) Description: LTG: Patient will perform toileting task (3/3 steps) with assistance level (OT)  Outcome: Completed/Met   Problem: RH Toilet Transfers Goal: LTG Patient will perform toilet transfers w/assist (OT) Description: LTG: Patient will perform toilet transfers with assist, with/without cues using equipment (OT) Outcome: Completed/Met   Problem: RH Tub/Shower Transfers Goal: LTG Patient will perform tub/shower transfers w/assist (OT) Description: LTG: Patient will perform tub/shower transfers with assist, with/without cues using equipment (OT) Outcome: Completed/Met   Problem: RH Dressing Goal: LTG Patient will perform lower body dressing w/assist (OT) Description: LTG: Patient will perform  lower body dressing with assist, with/without cues in positioning using equipment (OT) Outcome: Completed/Met

## 2022-09-09 NOTE — Progress Notes (Signed)
Patient has home CPAP at bedside.  RT assistance not needed at this time.  

## 2022-09-09 NOTE — Progress Notes (Signed)
Inpatient Rehabilitation Care Coordinator Discharge Note   Patient Details  Name: Jared Cline MRN: [id number] Date of Birth: 13-Aug-1946   Discharge location: Home  Length of Stay: 12 Days  Discharge activity level: Sup/Cga  Home/community participation: Spouse and children  Patient response EG:BTDVVO Literacy - How often do you need to have someone help you when you read instructions, pamphlets, or other written material from your doctor or pharmacy?: Rarely  Patient response HY:WVPXTG Isolation - How often do you feel lonely or isolated from those around you?: Never  Services provided included: SW, Pharmacy, TR, RN, CM, SLP, OT, PT, RD, MD  Financial Services:  Financial Services Utilized: [place] offered to/list presented to: Patient and spouse  Follow-up services arranged:  [place]: [facility] OT         Patient response to transportation need: Is the patient able to respond to transportation needs?: Yes In the past 12 months, has lack of transportation kept you from medical appointments or from getting medications?: No In the past 12 months, has lack of transportation kept you from meetings, work, or from getting things needed for daily living?: No    Comments (or additional information):  Patient/Family verbalized understanding of follow-up arrangements:  Yes  Individual responsible for coordination of the follow-up plan: self or spouse [phone]  Confirmed correct DME delivered: Dyanne Iha 09/09/2022    Dyanne Iha

## 2022-09-10 ENCOUNTER — Telehealth (HOSPITAL_COMMUNITY): Payer: Self-pay | Admitting: Pharmacy Technician

## 2022-09-10 ENCOUNTER — Other Ambulatory Visit (HOSPITAL_COMMUNITY): Payer: Self-pay

## 2022-09-10 DIAGNOSIS — K5900 Constipation, unspecified: Secondary | ICD-10-CM | POA: Insufficient documentation

## 2022-09-10 DIAGNOSIS — E871 Hypo-osmolality and hyponatremia: Secondary | ICD-10-CM | POA: Insufficient documentation

## 2022-09-10 LAB — BASIC METABOLIC PANEL
Anion gap: 5 (ref 5–15)
BUN: 16 mg/dL (ref 8–23)
CO2: 26 mmol/L (ref 22–32)
Calcium: 9.2 mg/dL (ref 8.9–10.3)
Chloride: 102 mmol/L (ref 98–111)
Creatinine, Ser: 1.31 mg/dL — ABNORMAL HIGH (ref 0.61–1.24)
GFR, Estimated: 56 mL/min — ABNORMAL LOW (ref >60)
Glucose, Bld: 121 mg/dL — ABNORMAL HIGH (ref 70–99)
Potassium: 4.3 mmol/L (ref 3.5–5.1)
Sodium: 133 mmol/L — ABNORMAL LOW (ref 135–145)

## 2022-09-10 NOTE — Progress Notes (Signed)
PROGRESS NOTE   Subjective/Complaints:  Patient feels ready for discharge today.  Review of systems negative CP, SOB,N/V/D  Objective:   No results found. Recent Labs    09/09/22 0611  WBC 4.8  HGB 11.9*  HCT 35.4*  PLT 370    Recent Labs    09/09/22 0611  NA 130*  K 4.2  CL 101  CO2 24  GLUCOSE 92  BUN 19  CREATININE 1.30*  CALCIUM 9.1     Intake/Output Summary (Last 24 hours) at 09/10/2022 0830 Last data filed at 09/10/2022 0750 Gross per 24 hour  Intake 712 ml  Output 650 ml  Net 62 ml         Physical Exam: Vital Signs Blood pressure (!) 140/65, pulse 69, temperature 98.3 F (36.8 C), temperature source Oral, resp. rate 19, height 6' (1.829 m), weight 95.3 kg, SpO2 100 %.   General: No acute distress, sitting in WC Mood and affect are appropriate Heart: Regular rate and rhythm no rubs murmurs or extra sounds Lungs: Clear to auscultation, breathing unlabored, no rales or wheezes, good air movement Abdomen: normoactive BS, soft nontender to palpation, non-distended Extremities: No clubbing, cyanosis, or edema Skin: warm and dry Neurologic: Cranial nerves II through XII grossly intact, motor strength is 5/5 in bilateral deltoid, bicep, tricep, grip, hip flexor, knee extensors, ankle dorsiflexor and plantar flexor  Cerebellar exam normal finger to nose to finger as well as heel to shin in bilateral upper and lower extremities   Assessment/Plan: 1. Functional deficits which require 3+ hours per day of interdisciplinary therapy in a comprehensive inpatient rehab setting. Physiatrist is providing close team supervision and 24 hour management of active medical problems listed below. Physiatrist and rehab team continue to assess barriers to discharge/monitor patient progress toward functional and medical goals  Care Tool:  Bathing    Body parts bathed by patient: Right arm, Left arm, Chest,  Abdomen, Front perineal area, Buttocks, Right upper leg, Left upper leg, Face, Left lower leg, Right lower leg         Bathing assist Assist Level: Set up assist     Upper Body Dressing/Undressing Upper body dressing   What is the patient wearing?: Pull over shirt    Upper body assist Assist Level: Independent    Lower Body Dressing/Undressing Lower body dressing      What is the patient wearing?: Underwear/pull up, Pants     Lower body assist Assist for lower body dressing: Independent with assitive device     Toileting Toileting    Toileting assist Assist for toileting: Independent with assistive device Assistive Device Comment: RW and BSC   Transfers Chair/bed transfer  Transfers assist     Chair/bed transfer assist level: Independent with assistive device Chair/bed transfer assistive device: Armrests, Programmer, multimedia   Ambulation assist      Assist level: Independent with assistive device Assistive device: Walker-rolling Max distance: 150'   Walk 10 feet activity   Assist     Assist level: Independent with assistive device Assistive device: Walker-rolling   Walk 50 feet activity   Assist    Assist level: Independent with assistive device Assistive  device: Walker-rolling    Walk 150 feet activity   Assist Walk 150 feet activity did not occur: Safety/medical concerns (fatigue)  Assist level: Independent with assistive device Assistive device: Walker-rolling    Walk 10 feet on uneven surface  activity   Assist Walk 10 feet on uneven surfaces activity did not occur: Safety/medical concerns   Assist level: Supervision/Verbal cueing Assistive device: Walker-rolling   Wheelchair     Assist Is the patient using a wheelchair?: No Type of Wheelchair: Manual    Wheelchair assist level: Moderate Assistance - Patient 50 - 74% Max wheelchair distance: 63'    Wheelchair 50 feet with 2 turns  activity    Assist        Assist Level: Moderate Assistance - Patient 50 - 74%   Wheelchair 150 feet activity     Assist      Assist Level: Maximal Assistance - Patient 25 - 49%   Blood pressure (!) 140/65, pulse 69, temperature 98.3 F (36.8 C), temperature source Oral, resp. rate 19, height 6' (1.829 m), weight 95.3 kg, SpO2 100 %.  Medical Problem List and Plan: 1. Functional deficits secondary to R ponto-cerebellar CVA             Discharge home today    2. New diagnosis segmental PE/Antithrombotics: -DVT/anticoagulation:  Pharmaceutical: Eliquis- at least 66mo            -antiplatelet therapy: N/A (ASA after DOAC completed)  3. Pain Management: oxycodone prn for knee pain.              - Pre-medicate AM therapies to allow participation             4. Mood/Behavior/Sleep: LCSW to follow for evaluation and support.              --Trazodone prn for sleep.              -antipsychotic agents: N/A 5. Neuropsych/cognition: This patient is capable of making decisions on his own behalf. 6. Skin/Wound Care: Routine pressure relief measures.  7. Fluids/Electrolytes/Nutrition: Monitor I/O. Check CMET in am.  8. HTN: Monitor BP TID- continue    09/10/2022    7:44 AM 09/10/2022    4:39 AM 09/09/2022    8:35 PM  Vitals with BMI  Systolic [id number] Diastolic 65 68 76  Pulse 69 66 66     mild systolic elevation  On  clonidine, avapro, hydralazine, spironolactone 1/22 occasional mild systolic elevation, continue to monitor 9. CKD 3A: Baseline SCr 1.3-1.4. Reports mild dysuria since admission. Encourage fluid intake.  BUN/Cr improved 1/12 vs 1/11 1/22 Cr 1.30 10. OSA: Continue CPAP 11. Prostate cancer s/p prostatectomy: 12. Right knee OA/effusion: Follow-up with Ortho as an outpatient  13. Hyponatremia: Borderline low. Continue to monitor sodium levels. Stable valus 1/12 vs 1/11  1/17 Na stable 132  1/22 Na stable overall at 130 today, will add Na tabs  14. Acute  blood loss anemia: Recheck in am  1/22 stable hgb 11.9          LOS: 12 days A FACE TO FACE EVALUATION WAS PERFORMED  ACharlett Blake1/23/2024, 8:30 AM

## 2022-09-10 NOTE — Progress Notes (Signed)
Patient ID: Jared Cline, male   DOB: [birth year], 77 y.o.   MRN: [id number]  Sw made attempt to call pt spouse to confirm transport for today. No answer, SW left VM. Sw will FU.

## 2022-09-10 NOTE — Telephone Encounter (Signed)
Patient Advocate Encounter  Prior Authorization for Scopolamine '1MG'$ /3DAYS 72 hr patches has been approved.    Key: [id number] Effective dates: 09/09/2022 through 09/10/2023      Lyndel Safe, [place] Patient Advocate Specialist [place] Patient Advocate Team Direct Number: [phone]  Fax: [phone]

## 2022-09-10 NOTE — Plan of Care (Signed)
  Problem: RH Balance Goal: LTG Patient will maintain dynamic standing balance (PT) Description: LTG:  Patient will maintain dynamic standing balance with assistance during mobility activities (PT) Outcome: Completed/Met   Problem: Sit to Stand Goal: LTG:  Patient will perform sit to stand with assistance level (PT) Description: LTG:  Patient will perform sit to stand with assistance level (PT) Outcome: Completed/Met   Problem: RH Bed Mobility Goal: LTG Patient will perform bed mobility with assist (PT) Description: LTG: Patient will perform bed mobility with assistance, with/without cues (PT). Outcome: Completed/Met   Problem: RH Car Transfers Goal: LTG Patient will perform car transfers with assist (PT) Description: LTG: Patient will perform car transfers with assistance (PT). Outcome: Completed/Met   Problem: RH Ambulation Goal: LTG Patient will ambulate in controlled environment (PT) Description: LTG: Patient will ambulate in a controlled environment, # of feet with assistance (PT). Outcome: Completed/Met Goal: LTG Patient will ambulate in home environment (PT) Description: LTG: Patient will ambulate in home environment, # of feet with assistance (PT). Outcome: Completed/Met   Problem: RH Stairs Goal: LTG Patient will ambulate up and down stairs w/assist (PT) Description: LTG: Patient will ambulate up and down # of stairs with assistance (PT) Outcome: Completed/Met

## 2022-09-10 NOTE — Progress Notes (Signed)
Inpatient Rehabilitation Discharge Medication Review by a Pharmacist   A complete drug regimen review was completed for this patient to identify any potential clinically significant medication issues.   High Risk Drug Classes Is patient taking? Indication by Medication  Antipsychotic No   Anticoagulant Yes Eliquis - PE (at least 6 months treatment)  Antibiotic No    Opioid Yes Oxycodone - for severepain  Antiplatelet No    Hypoglycemics/insulin No    Vasoactive Medication Yes Clonidine, olmesartan, spironolactone, hydralazine - HTN  Chemotherapy No    Other Yes Trazodone-sleep Pravastatin - HLD Voltaren gel - pain  Miralax, Seno-S - constipation  Scopolamine patch - nausea NaCl tabs - supplementation Cyanocobalamin- Vitamin B12 supplement.  Acetaminophen- prn mild pain        Type of Medication Issue Identified Description of Issue Recommendation(s)  Drug Interaction(s) (clinically significant)        Duplicate Therapy        Allergy        No Medication Administration End Date        Incorrect Dose        Additional Drug Therapy Needed        Significant med changes from prior encounter (inform family/care partners about these prior to discharge).      Other   PTA meds not yet resumed:  ASA MD noted plan is to resume ASA after Eliquis (at least 6 months) is completed.  This will be done by PCP outpatient.       Clinically significant medication issues were identified that warrant physician communication and completion of prescribed/recommended actions by midnight of the next day:  No   Name of provider notified for urgent issues identified:    Provider Method of Notification:    Pharmacist comments:    Time spent performing this drug regimen review (minutes):  [place], RPh Clinical Pharmacist

## 2022-09-11 ENCOUNTER — Telehealth: Payer: Self-pay | Admitting: Nurse Practitioner

## 2022-09-11 DIAGNOSIS — M25461 Effusion, right knee: Secondary | ICD-10-CM | POA: Diagnosis not present

## 2022-09-11 DIAGNOSIS — N2889 Other specified disorders of kidney and ureter: Secondary | ICD-10-CM | POA: Diagnosis not present

## 2022-09-11 DIAGNOSIS — Z79891 Long term (current) use of opiate analgesic: Secondary | ICD-10-CM | POA: Diagnosis not present

## 2022-09-11 DIAGNOSIS — Z7901 Long term (current) use of anticoagulants: Secondary | ICD-10-CM | POA: Diagnosis not present

## 2022-09-11 DIAGNOSIS — E871 Hypo-osmolality and hyponatremia: Secondary | ICD-10-CM | POA: Diagnosis not present

## 2022-09-11 DIAGNOSIS — M5136 Other intervertebral disc degeneration, lumbar region: Secondary | ICD-10-CM | POA: Diagnosis not present

## 2022-09-11 DIAGNOSIS — G5602 Carpal tunnel syndrome, left upper limb: Secondary | ICD-10-CM | POA: Diagnosis not present

## 2022-09-11 DIAGNOSIS — K5792 Diverticulitis of intestine, part unspecified, without perforation or abscess without bleeding: Secondary | ICD-10-CM | POA: Diagnosis not present

## 2022-09-11 DIAGNOSIS — E785 Hyperlipidemia, unspecified: Secondary | ICD-10-CM | POA: Diagnosis not present

## 2022-09-11 DIAGNOSIS — K581 Irritable bowel syndrome with constipation: Secondary | ICD-10-CM | POA: Diagnosis not present

## 2022-09-11 DIAGNOSIS — I739 Peripheral vascular disease, unspecified: Secondary | ICD-10-CM | POA: Diagnosis not present

## 2022-09-11 DIAGNOSIS — R42 Dizziness and giddiness: Secondary | ICD-10-CM | POA: Diagnosis not present

## 2022-09-11 DIAGNOSIS — I2693 Single subsegmental pulmonary embolism without acute cor pulmonale: Secondary | ICD-10-CM | POA: Diagnosis not present

## 2022-09-11 DIAGNOSIS — I129 Hypertensive chronic kidney disease with stage 1 through stage 4 chronic kidney disease, or unspecified chronic kidney disease: Secondary | ICD-10-CM | POA: Diagnosis not present

## 2022-09-11 DIAGNOSIS — G4733 Obstructive sleep apnea (adult) (pediatric): Secondary | ICD-10-CM | POA: Diagnosis not present

## 2022-09-11 DIAGNOSIS — N400 Enlarged prostate without lower urinary tract symptoms: Secondary | ICD-10-CM | POA: Diagnosis not present

## 2022-09-11 DIAGNOSIS — R296 Repeated falls: Secondary | ICD-10-CM | POA: Diagnosis not present

## 2022-09-11 DIAGNOSIS — D62 Acute posthemorrhagic anemia: Secondary | ICD-10-CM | POA: Diagnosis not present

## 2022-09-11 DIAGNOSIS — Z9181 History of falling: Secondary | ICD-10-CM | POA: Diagnosis not present

## 2022-09-11 DIAGNOSIS — H5509 Other forms of nystagmus: Secondary | ICD-10-CM | POA: Diagnosis not present

## 2022-09-11 DIAGNOSIS — N1831 Chronic kidney disease, stage 3a: Secondary | ICD-10-CM | POA: Diagnosis not present

## 2022-09-11 DIAGNOSIS — Z8546 Personal history of malignant neoplasm of prostate: Secondary | ICD-10-CM | POA: Diagnosis not present

## 2022-09-11 DIAGNOSIS — I69393 Ataxia following cerebral infarction: Secondary | ICD-10-CM | POA: Diagnosis not present

## 2022-09-11 DIAGNOSIS — R918 Other nonspecific abnormal finding of lung field: Secondary | ICD-10-CM | POA: Diagnosis not present

## 2022-09-11 DIAGNOSIS — M170 Bilateral primary osteoarthritis of knee: Secondary | ICD-10-CM | POA: Diagnosis not present

## 2022-09-11 NOTE — Telephone Encounter (Unsigned)
Copied from [place] [id number]. Topic: Quick Communication - Home Health Verbal Orders >> Sep 11, 2022  1:28 PM Cyndi Bender wrote: Caller/Agency: [name] with [facility]  Callback Number: [phone] Requesting OT/PT/Skilled Nursing/Social Work/Speech Therapy: PT Frequency: 2 times a week for 5 weeks and 1 time a week for 4 weeks

## 2022-09-11 NOTE — Telephone Encounter (Signed)
Per Almyra Free ok for PT

## 2022-09-12 DIAGNOSIS — M170 Bilateral primary osteoarthritis of knee: Secondary | ICD-10-CM | POA: Diagnosis not present

## 2022-09-12 DIAGNOSIS — G5602 Carpal tunnel syndrome, left upper limb: Secondary | ICD-10-CM | POA: Diagnosis not present

## 2022-09-12 DIAGNOSIS — I2693 Single subsegmental pulmonary embolism without acute cor pulmonale: Secondary | ICD-10-CM | POA: Diagnosis not present

## 2022-09-12 DIAGNOSIS — N2889 Other specified disorders of kidney and ureter: Secondary | ICD-10-CM | POA: Diagnosis not present

## 2022-09-12 DIAGNOSIS — I739 Peripheral vascular disease, unspecified: Secondary | ICD-10-CM | POA: Diagnosis not present

## 2022-09-12 DIAGNOSIS — R918 Other nonspecific abnormal finding of lung field: Secondary | ICD-10-CM | POA: Diagnosis not present

## 2022-09-12 DIAGNOSIS — I69393 Ataxia following cerebral infarction: Secondary | ICD-10-CM | POA: Diagnosis not present

## 2022-09-12 DIAGNOSIS — M25461 Effusion, right knee: Secondary | ICD-10-CM | POA: Diagnosis not present

## 2022-09-12 DIAGNOSIS — I129 Hypertensive chronic kidney disease with stage 1 through stage 4 chronic kidney disease, or unspecified chronic kidney disease: Secondary | ICD-10-CM | POA: Diagnosis not present

## 2022-09-12 DIAGNOSIS — E785 Hyperlipidemia, unspecified: Secondary | ICD-10-CM | POA: Diagnosis not present

## 2022-09-12 DIAGNOSIS — N1831 Chronic kidney disease, stage 3a: Secondary | ICD-10-CM | POA: Diagnosis not present

## 2022-09-12 DIAGNOSIS — D62 Acute posthemorrhagic anemia: Secondary | ICD-10-CM | POA: Diagnosis not present

## 2022-09-12 DIAGNOSIS — K581 Irritable bowel syndrome with constipation: Secondary | ICD-10-CM | POA: Diagnosis not present

## 2022-09-12 DIAGNOSIS — M5136 Other intervertebral disc degeneration, lumbar region: Secondary | ICD-10-CM | POA: Diagnosis not present

## 2022-09-12 DIAGNOSIS — G4733 Obstructive sleep apnea (adult) (pediatric): Secondary | ICD-10-CM | POA: Diagnosis not present

## 2022-09-12 DIAGNOSIS — K5792 Diverticulitis of intestine, part unspecified, without perforation or abscess without bleeding: Secondary | ICD-10-CM | POA: Diagnosis not present

## 2022-09-12 NOTE — Telephone Encounter (Signed)
Home Health Verbal Orders - Caller/Agency: Colletta Maryland from [place] Number: [phone] Requesting OT Frequency: 1x4

## 2022-09-13 DIAGNOSIS — I739 Peripheral vascular disease, unspecified: Secondary | ICD-10-CM | POA: Diagnosis not present

## 2022-09-13 DIAGNOSIS — M5136 Other intervertebral disc degeneration, lumbar region: Secondary | ICD-10-CM | POA: Diagnosis not present

## 2022-09-13 DIAGNOSIS — R918 Other nonspecific abnormal finding of lung field: Secondary | ICD-10-CM | POA: Diagnosis not present

## 2022-09-13 DIAGNOSIS — M170 Bilateral primary osteoarthritis of knee: Secondary | ICD-10-CM | POA: Diagnosis not present

## 2022-09-13 DIAGNOSIS — N2889 Other specified disorders of kidney and ureter: Secondary | ICD-10-CM | POA: Diagnosis not present

## 2022-09-13 DIAGNOSIS — K5792 Diverticulitis of intestine, part unspecified, without perforation or abscess without bleeding: Secondary | ICD-10-CM | POA: Diagnosis not present

## 2022-09-13 DIAGNOSIS — N1831 Chronic kidney disease, stage 3a: Secondary | ICD-10-CM | POA: Diagnosis not present

## 2022-09-13 DIAGNOSIS — D62 Acute posthemorrhagic anemia: Secondary | ICD-10-CM | POA: Diagnosis not present

## 2022-09-13 DIAGNOSIS — M25461 Effusion, right knee: Secondary | ICD-10-CM | POA: Diagnosis not present

## 2022-09-13 DIAGNOSIS — K581 Irritable bowel syndrome with constipation: Secondary | ICD-10-CM | POA: Diagnosis not present

## 2022-09-13 DIAGNOSIS — I129 Hypertensive chronic kidney disease with stage 1 through stage 4 chronic kidney disease, or unspecified chronic kidney disease: Secondary | ICD-10-CM | POA: Diagnosis not present

## 2022-09-13 DIAGNOSIS — G4733 Obstructive sleep apnea (adult) (pediatric): Secondary | ICD-10-CM | POA: Diagnosis not present

## 2022-09-13 DIAGNOSIS — I69393 Ataxia following cerebral infarction: Secondary | ICD-10-CM | POA: Diagnosis not present

## 2022-09-13 DIAGNOSIS — G5602 Carpal tunnel syndrome, left upper limb: Secondary | ICD-10-CM | POA: Diagnosis not present

## 2022-09-13 DIAGNOSIS — I2693 Single subsegmental pulmonary embolism without acute cor pulmonale: Secondary | ICD-10-CM | POA: Diagnosis not present

## 2022-09-13 DIAGNOSIS — E785 Hyperlipidemia, unspecified: Secondary | ICD-10-CM | POA: Diagnosis not present

## 2022-09-13 NOTE — Telephone Encounter (Signed)
Left message on vm for Stephaine to ok for OT. Per Almyra Free

## 2022-09-15 ENCOUNTER — Emergency Department: Payer: Medicare Other

## 2022-09-15 ENCOUNTER — Other Ambulatory Visit: Payer: Self-pay

## 2022-09-15 ENCOUNTER — Emergency Department
Admission: EM | Admit: 2022-09-15 | Discharge: 2022-09-15 | Disposition: A | Discharge status: Home / Self Care | Payer: Medicare Other | Attending: Emergency Medicine | Admitting: Emergency Medicine

## 2022-09-15 DIAGNOSIS — I10 Essential (primary) hypertension: Secondary | ICD-10-CM

## 2022-09-15 DIAGNOSIS — I129 Hypertensive chronic kidney disease with stage 1 through stage 4 chronic kidney disease, or unspecified chronic kidney disease: Secondary | ICD-10-CM | POA: Insufficient documentation

## 2022-09-15 DIAGNOSIS — R29818 Other symptoms and signs involving the nervous system: Secondary | ICD-10-CM | POA: Diagnosis not present

## 2022-09-15 DIAGNOSIS — Z86711 Personal history of pulmonary embolism: Secondary | ICD-10-CM | POA: Insufficient documentation

## 2022-09-15 DIAGNOSIS — Z7901 Long term (current) use of anticoagulants: Secondary | ICD-10-CM | POA: Diagnosis not present

## 2022-09-15 DIAGNOSIS — Z8673 Personal history of transient ischemic attack (TIA), and cerebral infarction without residual deficits: Secondary | ICD-10-CM

## 2022-09-15 DIAGNOSIS — R079 Chest pain, unspecified: Secondary | ICD-10-CM | POA: Diagnosis not present

## 2022-09-15 DIAGNOSIS — M7989 Other specified soft tissue disorders: Secondary | ICD-10-CM | POA: Diagnosis not present

## 2022-09-15 DIAGNOSIS — R42 Dizziness and giddiness: Secondary | ICD-10-CM | POA: Diagnosis not present

## 2022-09-15 DIAGNOSIS — I2510 Atherosclerotic heart disease of native coronary artery without angina pectoris: Secondary | ICD-10-CM | POA: Diagnosis not present

## 2022-09-15 DIAGNOSIS — N189 Chronic kidney disease, unspecified: Secondary | ICD-10-CM | POA: Diagnosis not present

## 2022-09-15 DIAGNOSIS — G319 Degenerative disease of nervous system, unspecified: Secondary | ICD-10-CM | POA: Diagnosis not present

## 2022-09-15 LAB — TROPONIN I (HIGH SENSITIVITY): Troponin I (High Sensitivity): 4 ng/L (ref <18)

## 2022-09-15 LAB — CBC WITH DIFFERENTIAL/PLATELET
Abs Immature Granulocytes: 0.02 10*3/uL (ref 0.00–0.07)
Basophils Absolute: 0 10*3/uL (ref 0.0–0.1)
Basophils Relative: 0 %
Eosinophils Absolute: 0.1 10*3/uL (ref 0.0–0.5)
Eosinophils Relative: 3 %
HCT: 36.7 % — ABNORMAL LOW (ref 39.0–52.0)
Hemoglobin: 11.9 g/dL — ABNORMAL LOW (ref 13.0–17.0)
Immature Granulocytes: 0 %
Lymphocytes Relative: 31 %
Lymphs Abs: 1.5 10*3/uL (ref 0.7–4.0)
MCH: 27.3 pg (ref 26.0–34.0)
MCHC: 32.4 g/dL (ref 30.0–36.0)
MCV: 84.2 fL (ref 80.0–100.0)
Monocytes Absolute: 0.3 10*3/uL (ref 0.1–1.0)
Monocytes Relative: 7 %
Neutro Abs: 2.8 10*3/uL (ref 1.7–7.7)
Neutrophils Relative %: 59 %
Platelets: 320 10*3/uL (ref 150–400)
RBC: 4.36 MIL/uL (ref 4.22–5.81)
RDW: 11.7 % (ref 11.5–15.5)
WBC: 4.8 10*3/uL (ref 4.0–10.5)
nRBC: 0 % (ref 0.0–0.2)

## 2022-09-15 LAB — COMPREHENSIVE METABOLIC PANEL
ALT: 12 U/L (ref 0–44)
AST: 17 U/L (ref 15–41)
Albumin: 3.8 g/dL (ref 3.5–5.0)
Alkaline Phosphatase: 81 U/L (ref 38–126)
Anion gap: 3 — ABNORMAL LOW (ref 5–15)
BUN: 12 mg/dL (ref 8–23)
CO2: 24 mmol/L (ref 22–32)
Calcium: 9.3 mg/dL (ref 8.9–10.3)
Chloride: 105 mmol/L (ref 98–111)
Creatinine, Ser: 1.16 mg/dL (ref 0.61–1.24)
GFR, Estimated: >60 mL/min (ref >60)
Glucose, Bld: 97 mg/dL (ref 70–99)
Potassium: 3.7 mmol/L (ref 3.5–5.1)
Sodium: 132 mmol/L — ABNORMAL LOW (ref 135–145)
Total Bilirubin: 0.5 mg/dL (ref 0.3–1.2)
Total Protein: 7.1 g/dL (ref 6.5–8.1)

## 2022-09-15 LAB — LIPASE, BLOOD: Lipase: 27 U/L (ref 11–51)

## 2022-09-15 MED ORDER — IOHEXOL 350 MG/ML SOLN
75.0000 mL | Freq: Once | INTRAVENOUS | Status: AC | PRN
  Administered 2022-09-15: 75 mL via INTRAVENOUS

## 2022-09-15 MED ORDER — HYDRALAZINE HCL 10 MG PO TABS
10.0000 mg | ORAL_TABLET | Freq: Once | ORAL | Status: AC
  Administered 2022-09-15: 10 mg via ORAL
  Filled 2022-09-15 (×2): qty 1

## 2022-09-15 MED ORDER — APIXABAN 5 MG PO TABS
5.0000 mg | ORAL_TABLET | Freq: Once | ORAL | Status: AC
  Administered 2022-09-15: 5 mg via ORAL
  Filled 2022-09-15: qty 1

## 2022-09-15 MED ORDER — CLONIDINE HCL 0.1 MG PO TABS
0.3000 mg | ORAL_TABLET | Freq: Once | ORAL | Status: AC
  Administered 2022-09-15: 0.3 mg via ORAL
  Filled 2022-09-15: qty 3

## 2022-09-15 NOTE — ED Triage Notes (Signed)
Pt to ED via [facility] from home for Dizziness that started when he woke up this morning, as well as hypertension and bilateral feet swelling for the last week. Pt reports that his last known well was Around 0100.    PT was discharged from [facility] on Tuesday with CVA. Pt states that he has been having issues with dizziness since his stroke but it had improved until this morning. Pt reports that he is on new blood pressure medications but he has not taken his medication this morning. Pt is reporting that he has numbness in both of his hands. EDP currently at bedside.     18 G LAC 217/94 75 100%  98.2 111 CBG  LWK 1 am- woke up dizzy

## 2022-09-15 NOTE — ED Notes (Signed)
Pt returned from CT °

## 2022-09-15 NOTE — ED Notes (Signed)
Pt ambulated with a walker to the toilet, gait was steady but he states "I feel a little lightheaded". Pt back from MRI and hooked up to the monitor. He denies having a headache or any pain at this time.

## 2022-09-15 NOTE — ED Notes (Signed)
Patient transported to CT 

## 2022-09-15 NOTE — ED Notes (Signed)
Sent extra lt green and lavendar tubes, blue top, and 1 red top to lab.

## 2022-09-15 NOTE — ED Notes (Signed)
Dr. Charna Archer informed of pts BP of 201/75

## 2022-09-15 NOTE — ED Provider Notes (Signed)
[facility] Provider Note    Event Date/Time   First MD Initiated Contact with Patient 09/15/22 0945     (approximate)   History   Chief Complaint Hypertension and Dizziness   HPI  Jared Cline is a 77 y.o. male with past medical history of hypertension, hyperlipidemia, CAD, CKD, PE on Eliquis, stroke, and BPH who presents to the ED complaining of dizziness.  Patient reports that his blood pressure was running high last night when his wife checked it, but he was otherwise feeling well.  He eventually went to bed around 1 AM this morning, reports feeling well at that time, but woke up around 8:00 this morning feeling dizzy.  He describes it as a feeling of the room spinning around him even if he sits still.  He denies any associated nausea or vomiting, does state he has felt unsteady on his feet walking this morning.  He reports some numbness and tingling in both hands, but has not noticed any vision changes, speech changes, or weakness in his extremities.  He reports taking his blood thinner as prescribed, has not yet taken his morning blood pressure medications.  Patient reported to have BP of 217/94 with EMS.     Physical Exam   Triage Vital Signs: ED Triage Vitals  Enc Vitals Group     BP --      Pulse --      Resp --      Temp --      Temp src --      SpO2 09/15/22 0944 100 %     Weight --      Height --      Head Circumference --      Peak Flow --      Pain Score 09/15/22 0954 0     Pain Loc --      Pain Edu? --      Excl. in [place]? --     Most recent vital signs: Vitals:   09/15/22 1030 09/15/22 1255  BP: (!) 198/73 (!) 201/75  Pulse: (!) 59 60  Resp: 16 18  Temp:  97.6 F (36.4 C)  SpO2: 100% 100%    Constitutional: Alert and oriented. Eyes: Conjunctivae are normal. Head: Atraumatic. Nose: No congestion/rhinnorhea. Mouth/Throat: Mucous membranes are moist.  Cardiovascular: Normal rate, regular rhythm. Grossly normal heart sounds.   2+ radial pulses bilaterally. Respiratory: Normal respiratory effort.  No retractions. Lungs CTAB. Gastrointestinal: Soft and nontender. No distention. Musculoskeletal: No lower extremity tenderness nor edema.  Neurologic:  Normal speech and language. No gross focal neurologic deficits are appreciated.    ED Results / Procedures / Treatments   Labs (all labs ordered are listed, but only abnormal results are displayed) Labs Reviewed  CBC WITH DIFFERENTIAL/PLATELET - Abnormal; Notable for the following components:      Result Value   Hemoglobin 11.9 (*)    HCT 36.7 (*)    All other components within normal limits  COMPREHENSIVE METABOLIC PANEL - Abnormal; Notable for the following components:   Sodium 132 (*)    Anion gap 3 (*)    All other components within normal limits  LIPASE, BLOOD  TROPONIN I (HIGH SENSITIVITY)     EKG  ED ECG REPORT I, Blake Divine, the attending physician, personally viewed and interpreted this ECG.   Date: 09/15/2022  EKG Time: 10:05  Rate: 56  Rhythm: normal sinus rhythm  Axis: LAD  Intervals:none  ST&T Change: None  RADIOLOGY CT head reviewed and interpreted by me with no hemorrhage or midline shift.  PROCEDURES:  Critical Care performed: No  Procedures   MEDICATIONS ORDERED IN ED: Medications  hydrALAZINE (APRESOLINE) tablet 10 mg (has no administration in time range)  apixaban (ELIQUIS) tablet 5 mg (has no administration in time range)  iohexol (OMNIPAQUE) 350 MG/ML injection 75 mL (75 mLs Intravenous Contrast Given 09/15/22 1158)  cloNIDine (CATAPRES) tablet 0.3 mg (0.3 mg Oral Given 09/15/22 1301)     IMPRESSION / MDM / ASSESSMENT AND PLAN / ED COURSE  I reviewed the triage vital signs and the nursing notes.                              77 y.o. male with past medical history of hypertension, hyperlipidemia, CAD, PE on Eliquis, stroke, CKD, and BPH who presents to the ED for dizziness and unsteadiness on his feet since  waking up this morning, noted to be hypertensive by EMS.  Patient's presentation is most consistent with acute presentation with potential threat to life or bodily function.  Differential diagnosis includes, but is not limited to, stroke, TIA, peripheral vertigo, electrolyte abnormality, AKI, anemia, hypertensive emergency.  Patient well-appearing and in no acute distress, vital signs remarkable for elevated blood pressure but otherwise reassuring.  Patient does not have any focal neurologic deficits on exam but does describe dizziness and unsteadiness on his feet concerning for posterior circulation stroke.  He is outside the window for TNK and low suspicion for LVO, but presentation reviewed with Dr. Leonel Ramsay of neurology, who recommends further assessment with CTA head and neck to rule out basilar artery occlusion or other acute finding.  We will also plan for MRI to rule out stroke, plan to allow for permissive hypertension while awaiting these results.  CTA head and neck limited by timing of contrast bolus, shows basilar stenosis and redemonstrates vertebral artery occlusion, but no evidence of stroke on MRI.  These findings were reviewed with Dr. Leonel Ramsay of neurology, who recommends no further intervention given CTA findings appear chronic and no acute stroke noted.  Patient is already anticoagulated on Eliquis, reports compliance with this.  He was given his home blood pressure medications for hypertension but no evidence of hypertensive emergency at this time.  He is appropriate for discharge home with PCP and neurology follow-up for recheck of blood pressure, patient and family counseled to return to the ED for new or worsening symptoms.  Patient agrees with plan.      FINAL CLINICAL IMPRESSION(S) / ED DIAGNOSES   Final diagnoses:  Dizziness  Uncontrolled hypertension  History of stroke     Rx / DC Orders   ED Discharge Orders     None        Note:  This document was  prepared using Dragon voice recognition software and may include unintentional dictation errors.   Blake Divine, MD 09/15/22 1359

## 2022-09-17 DIAGNOSIS — M5136 Other intervertebral disc degeneration, lumbar region: Secondary | ICD-10-CM | POA: Diagnosis not present

## 2022-09-17 DIAGNOSIS — K5792 Diverticulitis of intestine, part unspecified, without perforation or abscess without bleeding: Secondary | ICD-10-CM | POA: Diagnosis not present

## 2022-09-17 DIAGNOSIS — I2693 Single subsegmental pulmonary embolism without acute cor pulmonale: Secondary | ICD-10-CM | POA: Diagnosis not present

## 2022-09-17 DIAGNOSIS — G5602 Carpal tunnel syndrome, left upper limb: Secondary | ICD-10-CM | POA: Diagnosis not present

## 2022-09-17 DIAGNOSIS — M25461 Effusion, right knee: Secondary | ICD-10-CM | POA: Diagnosis not present

## 2022-09-17 DIAGNOSIS — G4733 Obstructive sleep apnea (adult) (pediatric): Secondary | ICD-10-CM | POA: Diagnosis not present

## 2022-09-17 DIAGNOSIS — I69393 Ataxia following cerebral infarction: Secondary | ICD-10-CM | POA: Diagnosis not present

## 2022-09-17 DIAGNOSIS — E785 Hyperlipidemia, unspecified: Secondary | ICD-10-CM | POA: Diagnosis not present

## 2022-09-17 DIAGNOSIS — I129 Hypertensive chronic kidney disease with stage 1 through stage 4 chronic kidney disease, or unspecified chronic kidney disease: Secondary | ICD-10-CM | POA: Diagnosis not present

## 2022-09-17 DIAGNOSIS — R918 Other nonspecific abnormal finding of lung field: Secondary | ICD-10-CM | POA: Diagnosis not present

## 2022-09-17 DIAGNOSIS — K581 Irritable bowel syndrome with constipation: Secondary | ICD-10-CM | POA: Diagnosis not present

## 2022-09-17 DIAGNOSIS — M170 Bilateral primary osteoarthritis of knee: Secondary | ICD-10-CM | POA: Diagnosis not present

## 2022-09-17 DIAGNOSIS — N1831 Chronic kidney disease, stage 3a: Secondary | ICD-10-CM | POA: Diagnosis not present

## 2022-09-17 DIAGNOSIS — N2889 Other specified disorders of kidney and ureter: Secondary | ICD-10-CM | POA: Diagnosis not present

## 2022-09-17 DIAGNOSIS — I739 Peripheral vascular disease, unspecified: Secondary | ICD-10-CM | POA: Diagnosis not present

## 2022-09-17 DIAGNOSIS — D62 Acute posthemorrhagic anemia: Secondary | ICD-10-CM | POA: Diagnosis not present

## 2022-09-19 ENCOUNTER — Encounter: Payer: Self-pay | Admitting: Registered Nurse

## 2022-09-19 ENCOUNTER — Encounter: Payer: Medicare Other | Attending: Registered Nurse | Admitting: Registered Nurse

## 2022-09-19 ENCOUNTER — Telehealth: Payer: Self-pay

## 2022-09-19 VITALS — BP 159/73 | HR 52 | Ht 73.0 in | Wt 226.4 lb

## 2022-09-19 DIAGNOSIS — G5602 Carpal tunnel syndrome, left upper limb: Secondary | ICD-10-CM | POA: Diagnosis not present

## 2022-09-19 DIAGNOSIS — M5136 Other intervertebral disc degeneration, lumbar region: Secondary | ICD-10-CM | POA: Diagnosis not present

## 2022-09-19 DIAGNOSIS — I739 Peripheral vascular disease, unspecified: Secondary | ICD-10-CM | POA: Diagnosis not present

## 2022-09-19 DIAGNOSIS — M174 Other bilateral secondary osteoarthritis of knee: Secondary | ICD-10-CM | POA: Diagnosis not present

## 2022-09-19 DIAGNOSIS — D62 Acute posthemorrhagic anemia: Secondary | ICD-10-CM | POA: Diagnosis not present

## 2022-09-19 DIAGNOSIS — K5792 Diverticulitis of intestine, part unspecified, without perforation or abscess without bleeding: Secondary | ICD-10-CM | POA: Diagnosis not present

## 2022-09-19 DIAGNOSIS — G4733 Obstructive sleep apnea (adult) (pediatric): Secondary | ICD-10-CM | POA: Diagnosis not present

## 2022-09-19 DIAGNOSIS — N1831 Chronic kidney disease, stage 3a: Secondary | ICD-10-CM | POA: Diagnosis not present

## 2022-09-19 DIAGNOSIS — I639 Cerebral infarction, unspecified: Secondary | ICD-10-CM

## 2022-09-19 DIAGNOSIS — K581 Irritable bowel syndrome with constipation: Secondary | ICD-10-CM | POA: Diagnosis not present

## 2022-09-19 DIAGNOSIS — E785 Hyperlipidemia, unspecified: Secondary | ICD-10-CM | POA: Diagnosis not present

## 2022-09-19 DIAGNOSIS — M170 Bilateral primary osteoarthritis of knee: Secondary | ICD-10-CM | POA: Diagnosis not present

## 2022-09-19 DIAGNOSIS — N2889 Other specified disorders of kidney and ureter: Secondary | ICD-10-CM | POA: Diagnosis not present

## 2022-09-19 DIAGNOSIS — I69393 Ataxia following cerebral infarction: Secondary | ICD-10-CM | POA: Diagnosis not present

## 2022-09-19 DIAGNOSIS — I10 Essential (primary) hypertension: Secondary | ICD-10-CM | POA: Diagnosis not present

## 2022-09-19 DIAGNOSIS — R001 Bradycardia, unspecified: Secondary | ICD-10-CM | POA: Diagnosis not present

## 2022-09-19 DIAGNOSIS — R918 Other nonspecific abnormal finding of lung field: Secondary | ICD-10-CM | POA: Diagnosis not present

## 2022-09-19 DIAGNOSIS — M25461 Effusion, right knee: Secondary | ICD-10-CM | POA: Diagnosis not present

## 2022-09-19 DIAGNOSIS — I129 Hypertensive chronic kidney disease with stage 1 through stage 4 chronic kidney disease, or unspecified chronic kidney disease: Secondary | ICD-10-CM | POA: Diagnosis not present

## 2022-09-19 DIAGNOSIS — I2693 Single subsegmental pulmonary embolism without acute cor pulmonale: Secondary | ICD-10-CM | POA: Diagnosis not present

## 2022-09-19 NOTE — Patient Instructions (Addendum)
Dr Manuella Ghazi: Neurology  Appointment: September 30, 2022 at 3:30  Arrival: 3:1215  [address] [facility] [place], [place] [address] [phone]

## 2022-09-19 NOTE — Progress Notes (Signed)
Subjective:    Patient ID: Jared Cline, male    DOB: [birth year], 77 y.o.   MRN: [id number]  HPI: Jared Cline is a 77 y.o. male who is herr for [facility] appointment for F/U of his Cerebellar Stroke, Essential Hypertension, Osteoarthritis of bilateral knees and Bradycardia. He was brought to [facility] on 08/24/2022 via EMS.  Jared Cline arrived to office with Bradycardia apical pulse was checked, he stated he is compliant with his medication. PCP was called and he has a scheduled appointment on 09/24/2022. He was instructed to check his vitals and call PCP with readings, he verbalizes understanding.  Dr. Damita Dunnings Note: 08/24/2022 HPI: Jared Cline is a 77 y.o. male with medical history significant for CAD, HTN, HLD CKD 3A, OSA on CPAP, history of prostate cancer s/p prostatectomy, complex renal cysts followed annually by urology, who was brought in by EMS for a fall with inability to get up.  Over the past several days patient has had dizziness and weakness resulting in multiple falls.  He was seen in the ED at [facility] on 08/19/2022 for dizziness and vomiting with SBP 220.  Syncope workup was significant for an age-indeterminate calcified embolus on CT head.  Symptoms were ultimately believed related to his blood pressure and he was observed and discharged.  Patient continued to have symptoms and after falling again on the night of arrival, return for evaluation.  He continues to complain of abdominal pain. ED course and data review: BP 145/66 with otherwise normal vitals.  Blood work unremarkable and at his baseline.EKG, personally viewed and interpreted shows sinus at 71 with no ischemic ST-T wave changes. MRI consistent with acute cerebellar stroke as follows: IMPRESSION: Punctate acute infarct in the posteroinferior pons/right cerebellar peduncle.  Neurology Consulted.  CT Head: Ct Cervical IMPRESSION: 1. No acute intracranial process. 2. No acute fracture or traumatic listhesis in the cervical spine.   MR:  Brain:  IMPRESSION: Punctate acute infarct in the posteroinferior pons/right cerebellar peduncle.   He was admitted to inpatient rehabilitation on 08/29/2022 and discharged home on 09/10/2022. He denies any pain at this time. He rated his pain on Health and History 6. Also reports he has a good appetite. He is receiving Home Health Therapy with Jared Cline.   This provider called PCP: He has a scheduled appointment on 09/24/2022.  This Provider called Dr Manuella Ghazi office, he has a scheduled appointment with Neurology on 09/30/2022.   He was seen at [facility]  ED on 01/28/204 for dizziness, note was reviewed.   Brother was in room   Pain Inventory Average Pain 8 Pain Right Now 6 My pain is stabbing and aching  LOCATION OF PAIN  head, knee  BOWEL Number of stools per week: 2 Oral laxative use Yes  Type of laxative sennakot Enema or suppository use No  History of colostomy No  Incontinent No   BLADDER Normal In and out cath, frequency . Able to self cath  . Bladder incontinence No  Frequent urination No  Leakage with coughing No  Difficulty starting stream No  Incomplete bladder emptying No    Mobility walk with assistance use a walker ability to climb steps?  yes do you drive?  yes  Function retired  Neuro/Psych dizziness  Prior [facility] f/u  [facility] f /u   Family History  Problem Relation Age of Onset   Stroke Mother    Hypertension Mother    Aneurysm Father  cerebral   Hypertension Father    Heart disease Paternal Uncle    Alcohol abuse Brother    Cancer Neg Hx    COPD Neg Hx    Diabetes Neg Hx    Social History   Socioeconomic History   Marital status: Married    Spouse name: Kristen Bushway   Number of children: 3   Years of education: Not on file   Highest education level: Not on file  Occupational History   Occupation: retired [employer] [place] Use   Smoking status: Former     Packs/day: 0.50    Years: 9.00    Total pack years: 4.50    Types: Cigarettes    Quit date: 08/20/1971    Years since quitting: 51.1    Passive exposure: Past   Smokeless tobacco: Never  Vaping Use   Vaping Use: Never used  Substance and Sexual Activity   Alcohol use: No    Alcohol/week: 0.0 standard drinks of alcohol   Drug use: No   Sexual activity: Not Currently  Other Topics Concern   Not on file  Social History Narrative   Retired from [employer] [place] 2012.   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    Social Determinants of Health   Financial Resource Strain: Low Risk  (07/09/2022)   Overall Financial Resource Strain (CARDIA)    Difficulty of Paying Living Expenses: Not hard at all  Food Insecurity: No Food Insecurity (08/24/2022)   Hunger Vital Sign    Worried About Running Out of Food in the Last Year: Never true    Ran Out of Food in the Last Year: Never true  Transportation Needs: No Transportation Needs (08/24/2022)   PRAPARE - Hydrologist (Medical): No    Lack of Transportation (Non-Medical): No  Physical Activity: Inactive (07/05/2021)   Exercise Vital Sign    Days of Exercise per Week: 0 days    Minutes of Exercise per Session: 0 min  Stress: No Stress Concern Present (07/09/2022)   [place]    Feeling of Stress : Not at all  Social Connections: [place] (07/09/2022)   Social Connection and Isolation Panel [NHANES]    Frequency of Communication with Friends and Family: Three times a week    Frequency of Social Gatherings with Friends and Family: Once a week    Attends Religious Services: More than 4 times per year    Active Member of Clubs or Organizations: Not on file    Attends Archivist Meetings: More than 4 times per year    Marital Status: Married   Past Surgical History:  Procedure Laterality Date   abdominal  ultrasound  Oct 2015   Negative for eval for AAA   COLONOSCOPY     KNEE ARTHROSCOPY Right    x 2   LUMBAR LAMINECTOMY     PROSTATECTOMY     robotic   Past Medical History:  Diagnosis Date   BPH (benign prostatic hypertrophy)    Bradycardia    evaluated by Dr. Nehemiah Massed   Cancer [facility])    prostate   Carpal tunnel syndrome, left    left arm   CKD (chronic kidney disease) stage 2, GFR 60-89 ml/min    Diverticulitis    Gastritis 2017   History of prostate cancer    HNP (herniated nucleus pulposus), lumbar    Hyperlipidemia  Hypertension    IBS (irritable bowel syndrome)    Impotence    Multiple lung nodules    nonspecific, largest 91m in Jan 2016; scanned March 2017; rescan 3-6 months later   OA (osteoarthritis) of knee    right   Renal mass    evaluated; followed by urologist   Sleep apnea    on CPAP   Torn meniscus    x 2; right knee   BP (!) 159/73   Pulse (!) 54   Ht '6\' 1"'$  (1.854 m)   Wt 226 lb 6.4 oz (102.7 kg)   SpO2 98%   BMI 29.87 kg/m   Opioid Risk Score:   Fall Risk Score:  `1  Depression screen PHQ 2/9     09/19/2022    1:22 PM 07/18/2022   10:13 AM 07/09/2022    1:31 PM 04/02/2022   12:47 PM 03/21/2022    9:42 AM 02/20/2022    2:15 PM 12/13/2021   10:38 AM  Depression screen PHQ 2/9  Decreased Interest 0 0 0 0 0 0 0  Down, Depressed, Hopeless 0 0 0 0 0 0 0  PHQ - 2 Score 0 0 0 0 0 0 0  Altered sleeping 0    0  0  Tired, decreased energy 0    0  0  Change in appetite 0    0  0  Feeling bad or failure about yourself  0    0  0  Trouble concentrating 0    0  0  Moving slowly or fidgety/restless 0    0  0  Suicidal thoughts 0    0  0  PHQ-9 Score 0    0  0  Difficult doing work/chores     Not difficult at all       Review of Systems  Musculoskeletal:        Knee pain  Neurological:  Positive for dizziness.  All other systems reviewed and are negative.     Objective:   Physical Exam Vitals and nursing note reviewed.  Constitutional:       Appearance: Normal appearance.  Cardiovascular:     Rate and Rhythm: Normal rate and regular rhythm.     Pulses: Normal pulses.     Heart sounds: Normal heart sounds.  Pulmonary:     Effort: Pulmonary effort is normal.     Breath sounds: Normal breath sounds.  Musculoskeletal:        General: No swelling.     Cervical back: Normal range of motion and neck supple.     Right lower leg: Edema present.     Left lower leg: Edema present.     Comments: Normal Muscle Bulk and Muscle Testing Reveals:  Upper Extremities: Full ROM and Muscle Strength 5/5  Lower Extremities: Full ROM and Muscle Strength 5/5 Bilateral Lower Extremities Flexion Produces Pain into his Bilateral Patella's Arises from Table slowly using walker for support Narrow Based  Gait     Skin:    General: Skin is warm and dry.  Neurological:     Mental Status: He is alert and oriented to person, place, and time.  Psychiatric:        Mood and Affect: Mood normal.        Behavior: Behavior normal.         Assessment & Plan:  Cerebellar Stroke: Dr SManuella Ghaziwas called he has a scheduled appointment on 09/30/2022. Continue Home health  therapy with  Jared Cline. Continue current medication Regimen. Continue to monitor.  2. , Essential Hypertension: Continue current medication regimen. PCP was called he has a scheduled appointment on 09/24/2022. Continue to monitor., 3.Osteoarthritis of bilateral knees: Continue HEP as Tolerated. Continue to Monitor.  4. Bradycardia: Apical Pulse Checked. PCP office called he now has a scheduled appointment on 09/24/2022. He will check his vitals and call his PCP with readings. He verbalizes understanding.   F/U with Dr Letta Pate in 4- 6 weeks

## 2022-09-19 NOTE — Telephone Encounter (Signed)
        Patient  visited [facility] on 09/15/2022  for hypertension, dizziness.   Telephone encounter attempt :  1st  A HIPAA compliant voice message was left requesting a return call.  Instructed patient to call back at [phone].   [place] Resource Care Guide   [email or web address]  ?? [phone]   Website: [email or web address]  [email or web address]

## 2022-09-23 ENCOUNTER — Telehealth: Payer: Self-pay

## 2022-09-23 DIAGNOSIS — G5602 Carpal tunnel syndrome, left upper limb: Secondary | ICD-10-CM | POA: Diagnosis not present

## 2022-09-23 DIAGNOSIS — M170 Bilateral primary osteoarthritis of knee: Secondary | ICD-10-CM | POA: Diagnosis not present

## 2022-09-23 DIAGNOSIS — G4733 Obstructive sleep apnea (adult) (pediatric): Secondary | ICD-10-CM | POA: Diagnosis not present

## 2022-09-23 DIAGNOSIS — N2889 Other specified disorders of kidney and ureter: Secondary | ICD-10-CM | POA: Diagnosis not present

## 2022-09-23 DIAGNOSIS — M25461 Effusion, right knee: Secondary | ICD-10-CM | POA: Diagnosis not present

## 2022-09-23 DIAGNOSIS — I69393 Ataxia following cerebral infarction: Secondary | ICD-10-CM | POA: Diagnosis not present

## 2022-09-23 DIAGNOSIS — M5136 Other intervertebral disc degeneration, lumbar region: Secondary | ICD-10-CM | POA: Diagnosis not present

## 2022-09-23 DIAGNOSIS — I739 Peripheral vascular disease, unspecified: Secondary | ICD-10-CM | POA: Diagnosis not present

## 2022-09-23 DIAGNOSIS — D62 Acute posthemorrhagic anemia: Secondary | ICD-10-CM | POA: Diagnosis not present

## 2022-09-23 DIAGNOSIS — N1831 Chronic kidney disease, stage 3a: Secondary | ICD-10-CM | POA: Diagnosis not present

## 2022-09-23 DIAGNOSIS — I129 Hypertensive chronic kidney disease with stage 1 through stage 4 chronic kidney disease, or unspecified chronic kidney disease: Secondary | ICD-10-CM | POA: Diagnosis not present

## 2022-09-23 DIAGNOSIS — K581 Irritable bowel syndrome with constipation: Secondary | ICD-10-CM | POA: Diagnosis not present

## 2022-09-23 DIAGNOSIS — K5792 Diverticulitis of intestine, part unspecified, without perforation or abscess without bleeding: Secondary | ICD-10-CM | POA: Diagnosis not present

## 2022-09-23 DIAGNOSIS — E785 Hyperlipidemia, unspecified: Secondary | ICD-10-CM | POA: Diagnosis not present

## 2022-09-23 DIAGNOSIS — R918 Other nonspecific abnormal finding of lung field: Secondary | ICD-10-CM | POA: Diagnosis not present

## 2022-09-23 DIAGNOSIS — I2693 Single subsegmental pulmonary embolism without acute cor pulmonale: Secondary | ICD-10-CM | POA: Diagnosis not present

## 2022-09-23 NOTE — Telephone Encounter (Signed)
        Patient  visited [facility] on 09/15/2022  for hypertension, dizziness.   Telephone encounter attempt :  2nd  A HIPAA compliant voice message was left requesting a return call.  Instructed patient to call back at [phone].   [place] Resource Care Guide   [email or web address]  ?? [phone]   Website: [email or web address]  [email or web address]

## 2022-09-23 NOTE — Progress Notes (Unsigned)
There were no vitals taken for this visit.   Subjective:    Patient ID: Jared Cline, male    DOB: 04/23/1946, 77 y.o.   MRN: [id number]  HPI: Jared Cline is a 77 y.o. male  No chief complaint on file.  Hospital follow up/ stroke/PE: patient was admitted on 08/24/2022 and discharged on 08/29/2022. He went to the emergency department with a c/o weakness and dizziness. Er note : MRI reviewed and interpreted by er physician and the radiologist and shows an acute stroke in the cerebellum. Not a tPA candidate due to symptoms ongoing for several days. CT scans reviewed and interpreted by myself and the radiologist and show no traumatic injury but concern for right segmental PE and will need a dedicated CTA of his chest at a later time per the radiologist. Will hold on heparinizing the patient at this time given he has an acute stroke and he is not currently hypoxic or have any signs of right heart strain. Additional is unremarkable. Normal LFTs, lipase. Negative troponin. Urine shows no infection. COVID, flu and RSV negative. Patient was admitted and further work up was completed. After imaging was completed it was determined that patient also had a PE. Patient was treated with anticoagulants. Patient was discharged into a rehab facility and was there until 09/09/2022.    Ct head: 1. No acute intracranial process. 2. No acute fracture or traumatic listhesis in the cervical spine. MRI brain: Punctate acute infarct in the posteroinferior pons/right cerebellar peduncle. Ct chest : 1. No evidence of injury to the chest or abdomen. 2. Equivocal for segmental pulmonary embolism in the right upper lobe, enough concern to recommend follow-up PE study when renal function permits. 3. Multiple complicated renal cysts and solid right renal lesion, reference abdominal MRI with follow-up recommendations 07/09/2021 4. Atherosclerosis including the coronary arteries. Ct angio chest: 1. Exam is positive for acute  pulmonary embolus within segmental branch of the right upper lobe pulmonary artery and segmental branch of the left lower lobe pulmonary artery. 2. Mild mosaic attenuation of the lungs which may reflect small airways disease. 3. Multiple complicated renal cysts, reference abdominal MRI with follow-up recommendations 07/09/2021. 4. Coronary artery calcifications noted. 5. 5 mm right upper lobe pulmonary nodule is stable from 2019 and is considered benign. 6.  Aortic Atherosclerosis (ICD10-I70.0). MRA head: 1. Extensive intracranial atherosclerosis. 2. Advanced mid basilar and bilateral PCA branch stenoses. 3. Moderate right M1 segment stenosis. 4. Occluded left vertebral artery in the neck with underestimated reconstitution of the left V4 segment when compared to postcontrast neck MRA which covers the same area. MRA neck: 1. Occluded left vertebral artery in the neck with retrograde filling of the left V4 segment and left PICA. 2. Severe atheromatous stenosis at the proximal right ICA. 50% narrowing at the proximal left ICA.   Plan at discharge: Acute cerebrovascular accident of cerebellum ([place]) Frequent falls MRI showing Punctate acute infarct in the posteroinferior pons/right cerebellar peduncle.   LDL 86, A1c 5.8.  Prior [facility] MD discussed case with Neurology, at this time patient was started on Eliquis given pulmonary embolism and hold off on antiplatelet.  In the future once he has completed anticoagulation treatment, antiplatelet can be started. MRA head and neck showed extensive intracranial atherosclerosis and occluded left vertebral artery.  Neurology recommended continuing anticoagulation for PE. But eventually will need to be on antiplatelets if therapeutic anticoagulation is discontinued.  Outpatient follow-up with Neurology Echocardiogram-70% EF, grade 1 DD PT/OT-CIR.  Right knee pain and swelling Denies history of gout.  Patient and spouse at bedside confirm injury to  right knee during one of his 4 falls PTA.  X-ray of right knee shows effusion but no other acute bony findings.  Does show chronic degenerative findings.  Both verbalized history of osteoarthritis.  Supportive treatment with analgesics and physical therapy.  If symptoms and sign do not improve or worsen then may need further evaluation including CT of the right knee and orthopedic evaluation.  This was discussed with patient and spouse and they verbalized understanding.  As per patient, knee pain has significantly improved.  Rest of management as above.   Segmental pulmonary embolism, acute Eliquis twice daily.  Duration of anticoagulation?  6 months versus prolonged if this is presumed as without provocation.  This needs to be followed as outpatient with PCP and final determination has to be made.  May consider outpatient Hematology consultation.    History of radical prostatectomy No acute issues   Coronary artery disease involving native coronary artery of native heart Continue  pravastatin.  No angina.   PVD (peripheral vascular disease) (HCC) Continue  pravastatin   OSA on CPAP CPAP nightly   Renal cysts Multiple complicated renal cysts noted on CTA chest with contrast on 08/25/2022 and multiple complicated renal cysts and solid right renal lesion noted by CT chest, abdomen and pelvis with contrast on 08/24/2022: Recommend outpatient follow-up as deemed necessary.  Reportedly follows with urology and if he does not, then this should be arranged through PCP.   5 mm RUL pulmonary nodule: Noted on CTA chest 08/25/2022.  Reported as stable and benign.  Outpatient follow-up as deemed necessary.   Stage 3a chronic kidney disease (HCC) Creatinine went up slightly from 1.2 to 1.42 days ago but has plateaued in the 1.4 range despite IV fluids.  Recommend following BMP as outpatient.  The bump in creatinine may be related to contrast for CTA chest.  Creatinine down to 1.31 on 1/11.  Upon review of prior  records, current BMP appears to be at baseline.  Monitor BMP periodically as outpatient.  Patient chronically is on ARB, Aldactone.  HCTZ was discontinued to avoid worsening hyponatremia.   Hypertension Controlled.  Continue prior home dose of clonidine 0.2 mg twice daily, Benicar 20 mg daily and Aldactone 25 Mg daily.  Hydralazine was changed from as needed to 10 Mg 3 times daily.  Controlled.   Body mass index is 30.74 kg/m./Obesity Relevant past medical, surgical, family and social history reviewed and updated as indicated. Interim medical history since our last visit reviewed. Allergies and medications reviewed and updated.  Med changes:  STOP taking these medications     aspirin EC 81 MG tablet    hydrochlorothiazide 12.5 MG tablet Commonly known as: HYDRODIURIL    losartan 50 MG tablet Commonly known as: COZAAR    naproxen sodium 220 MG tablet Commonly known as: ALEVE    Potassium Gluconate 550 MG Tabs     Start :apixaban 5 MG Tabs tablet Commonly known as: ELIQUIS Take 1 tablet (5 mg total) by mouth 2 (two) times daily. Start taking on: September 01, 2022   Patient has appointment with neurology on : 09/30/2022.   Patient was also seen again in the emergency room on 09/15/2022 for dizziness. They repeated ct scan which showed CTA head and neck limited by timing of contrast bolus, shows basilar stenosis and redemonstrates vertebral artery occlusion, but no evidence of stroke on MRI.  These findings were reviewed with Dr. Leonel Ramsay of neurology, who recommends no further intervention given CTA findings appear chronic and no acute stroke noted.  Patient is already anticoagulated on Eliquis, reports compliance with this.  He was given his home blood pressure medications for hypertension but no evidence of hypertensive emergency at this time.  He is appropriate for discharge home with PCP and neurology follow-up for recheck of blood pressure, patient and family counseled to return  to the ED for new or worsening symptoms.  Patient agrees with plan.    Review of Systems  Constitutional: Negative for fever or weight change.  Respiratory: Negative for cough and shortness of breath.   Cardiovascular: Negative for chest pain or palpitations.  Gastrointestinal: Negative for abdominal pain, no bowel changes.  Musculoskeletal: Negative for gait problem or joint swelling.  Skin: Negative for rash.  Neurological: Negative for dizziness or headache.  No other specific complaints in a complete review of systems (except as listed in HPI above).      Objective:    There were no vitals taken for this visit.  Wt Readings from Last 3 Encounters:  09/19/22 226 lb 6.4 oz (102.7 kg)  09/15/22 236 lb (107 kg)  09/05/22 210 lb 1.6 oz (95.3 kg)    Physical Exam  Constitutional: Patient appears well-developed and well-nourished. Obese *** No distress.  HEENT: head atraumatic, normocephalic, pupils equal and reactive to light, ears ***, neck supple, throat within normal limits Cardiovascular: Normal rate, regular rhythm and normal heart sounds.  No murmur heard. No BLE edema. Pulmonary/Chest: Effort normal and breath sounds normal. No respiratory distress. Abdominal: Soft.  There is no tenderness. Psychiatric: Patient has a normal mood and affect. behavior is normal. Judgment and thought content normal.  Results for orders placed or performed during the hospital encounter of 09/15/22  CBC with Differential  Result Value Ref Range   WBC 4.8 4.0 - 10.5 K/uL   RBC 4.36 4.22 - 5.81 MIL/uL   Hemoglobin 11.9 (L) 13.0 - 17.0 g/dL   HCT 36.7 (L) 39.0 - 52.0 %   MCV 84.2 80.0 - 100.0 fL   MCH 27.3 26.0 - 34.0 pg   MCHC 32.4 30.0 - 36.0 g/dL   RDW 11.7 11.5 - 15.5 %   Platelets 320 150 - 400 K/uL   nRBC 0.0 0.0 - 0.2 %   Neutrophils Relative % 59 %   Neutro Abs 2.8 1.7 - 7.7 K/uL   Lymphocytes Relative 31 %   Lymphs Abs 1.5 0.7 - 4.0 K/uL   Monocytes Relative 7 %   Monocytes  Absolute 0.3 0.1 - 1.0 K/uL   Eosinophils Relative 3 %   Eosinophils Absolute 0.1 0.0 - 0.5 K/uL   Basophils Relative 0 %   Basophils Absolute 0.0 0.0 - 0.1 K/uL   Immature Granulocytes 0 %   Abs Immature Granulocytes 0.02 0.00 - 0.07 K/uL  Comprehensive metabolic panel  Result Value Ref Range   Sodium 132 (L) 135 - 145 mmol/L   Potassium 3.7 3.5 - 5.1 mmol/L   Chloride 105 98 - 111 mmol/L   CO2 24 22 - 32 mmol/L   Glucose, Bld 97 70 - 99 mg/dL   BUN 12 8 - 23 mg/dL   Creatinine, Ser 1.16 0.61 - 1.24 mg/dL   Calcium 9.3 8.9 - 10.3 mg/dL   Total Protein 7.1 6.5 - 8.1 g/dL   Albumin 3.8 3.5 - 5.0 g/dL   AST 17 15 - 41 U/L  ALT 12 0 - 44 U/L   Alkaline Phosphatase 81 38 - 126 U/L   Total Bilirubin 0.5 0.3 - 1.2 mg/dL   GFR, Estimated >60 >60 mL/min   Anion gap 3 (L) 5 - 15  Lipase, blood  Result Value Ref Range   Lipase 27 11 - 51 U/L  Troponin I (High Sensitivity)  Result Value Ref Range   Troponin I (High Sensitivity) 4 <18 ng/L      Assessment & Plan:   Problem List Items Addressed This Visit   None    Follow up plan: No follow-ups on file.

## 2022-09-24 ENCOUNTER — Encounter: Payer: Self-pay | Admitting: Nurse Practitioner

## 2022-09-24 ENCOUNTER — Ambulatory Visit: Payer: Self-pay

## 2022-09-24 ENCOUNTER — Ambulatory Visit (INDEPENDENT_AMBULATORY_CARE_PROVIDER_SITE_OTHER): Payer: Medicare Other | Admitting: Nurse Practitioner

## 2022-09-24 VITALS — BP 144/86 | HR 97 | Temp 97.8°F | Resp 18 | Ht 72.0 in | Wt 223.4 lb

## 2022-09-24 DIAGNOSIS — I2699 Other pulmonary embolism without acute cor pulmonale: Secondary | ICD-10-CM | POA: Diagnosis not present

## 2022-09-24 DIAGNOSIS — R918 Other nonspecific abnormal finding of lung field: Secondary | ICD-10-CM | POA: Diagnosis not present

## 2022-09-24 DIAGNOSIS — M170 Bilateral primary osteoarthritis of knee: Secondary | ICD-10-CM | POA: Diagnosis not present

## 2022-09-24 DIAGNOSIS — I2693 Single subsegmental pulmonary embolism without acute cor pulmonale: Secondary | ICD-10-CM | POA: Diagnosis not present

## 2022-09-24 DIAGNOSIS — N1831 Chronic kidney disease, stage 3a: Secondary | ICD-10-CM | POA: Diagnosis not present

## 2022-09-24 DIAGNOSIS — Z8673 Personal history of transient ischemic attack (TIA), and cerebral infarction without residual deficits: Secondary | ICD-10-CM | POA: Diagnosis not present

## 2022-09-24 DIAGNOSIS — K5792 Diverticulitis of intestine, part unspecified, without perforation or abscess without bleeding: Secondary | ICD-10-CM | POA: Diagnosis not present

## 2022-09-24 DIAGNOSIS — G5602 Carpal tunnel syndrome, left upper limb: Secondary | ICD-10-CM | POA: Diagnosis not present

## 2022-09-24 DIAGNOSIS — M5136 Other intervertebral disc degeneration, lumbar region: Secondary | ICD-10-CM | POA: Diagnosis not present

## 2022-09-24 DIAGNOSIS — D62 Acute posthemorrhagic anemia: Secondary | ICD-10-CM | POA: Diagnosis not present

## 2022-09-24 DIAGNOSIS — K581 Irritable bowel syndrome with constipation: Secondary | ICD-10-CM | POA: Diagnosis not present

## 2022-09-24 DIAGNOSIS — G4733 Obstructive sleep apnea (adult) (pediatric): Secondary | ICD-10-CM | POA: Diagnosis not present

## 2022-09-24 DIAGNOSIS — Z09 Encounter for follow-up examination after completed treatment for conditions other than malignant neoplasm: Secondary | ICD-10-CM | POA: Diagnosis not present

## 2022-09-24 DIAGNOSIS — E785 Hyperlipidemia, unspecified: Secondary | ICD-10-CM | POA: Diagnosis not present

## 2022-09-24 DIAGNOSIS — I69393 Ataxia following cerebral infarction: Secondary | ICD-10-CM | POA: Diagnosis not present

## 2022-09-24 DIAGNOSIS — M25461 Effusion, right knee: Secondary | ICD-10-CM | POA: Diagnosis not present

## 2022-09-24 DIAGNOSIS — I739 Peripheral vascular disease, unspecified: Secondary | ICD-10-CM | POA: Diagnosis not present

## 2022-09-24 DIAGNOSIS — I129 Hypertensive chronic kidney disease with stage 1 through stage 4 chronic kidney disease, or unspecified chronic kidney disease: Secondary | ICD-10-CM | POA: Diagnosis not present

## 2022-09-24 DIAGNOSIS — N2889 Other specified disorders of kidney and ureter: Secondary | ICD-10-CM | POA: Diagnosis not present

## 2022-09-24 NOTE — Telephone Encounter (Signed)
This encounter was created in error - please disregard.

## 2022-09-24 NOTE — Telephone Encounter (Signed)
  Chief Complaint: Missing medication Symptoms: Swollen feet Frequency: today Pertinent Negatives: Patient denies  Disposition: '[]'$ ED /'[]'$ Urgent Care (no appt availability in office) / '[]'$ Appointment(In office/virtual)/ '[]'$  [place] [facility]/ '[]'$ Home Care/ '[]'$ Refused Recommended Disposition /'[]'$ [place] [facility]/ '[x]'$  Follow-up with PCP Additional Notes: Pt was seen in the office today. Pt thought he was supposed to have a water pill prescribed today. Medication is not at pharmacy .  Please advise.    Reason for Disposition  [1] Prescription refill request for ESSENTIAL medicine (i.e., likelihood of harm to patient if not taken) AND [2] triager unable to refill per department policy  Answer Assessment - Initial Assessment Questions 1. DRUG NAME: "What medicine do you need to have refilled?"     Water pill 2. REFILLS REMAINING: "How many refills are remaining?" (Note: The label on the medicine or pill bottle will show how many refills are remaining. If there are no refills remaining, then a renewal may be needed.)      3. EXPIRATION DATE: "What is the expiration date?" (Note: The label states when the prescription will expire, and thus can no longer be refilled.)      4. PRESCRIBING HCP: "Who prescribed it?" Reason: If prescribed by specialist, call should be referred to that group.      5. SYMPTOMS: "Do you have any symptoms?"     Swollen feet 6. PREGNANCY: "Is there any chance that you are pregnant?" "When was your last menstrual period?"  Protocols used: Medication Refill and Renewal Call-A-AH

## 2022-09-24 NOTE — Assessment & Plan Note (Signed)
Patient reports he is doing better.  He has an appointment scheduled with neurology on 09/30/2022.  He is currently using a walker and getting physical therapy at home three times a week. He is currently taking eliquis two times a day.

## 2022-09-24 NOTE — Assessment & Plan Note (Signed)
Patient on eliquis.  Referral placed to hematology for unprovoked pe.

## 2022-09-25 DIAGNOSIS — N2889 Other specified disorders of kidney and ureter: Secondary | ICD-10-CM | POA: Diagnosis not present

## 2022-09-25 DIAGNOSIS — E7800 Pure hypercholesterolemia, unspecified: Secondary | ICD-10-CM | POA: Diagnosis not present

## 2022-09-25 DIAGNOSIS — M1993 Secondary osteoarthritis, unspecified site: Secondary | ICD-10-CM | POA: Diagnosis not present

## 2022-09-25 DIAGNOSIS — I10 Essential (primary) hypertension: Secondary | ICD-10-CM | POA: Diagnosis not present

## 2022-09-25 NOTE — Telephone Encounter (Signed)
Patient stated he spoke to [place] on yesterday

## 2022-09-26 ENCOUNTER — Telehealth: Payer: Self-pay

## 2022-09-26 DIAGNOSIS — G5602 Carpal tunnel syndrome, left upper limb: Secondary | ICD-10-CM | POA: Diagnosis not present

## 2022-09-26 DIAGNOSIS — E785 Hyperlipidemia, unspecified: Secondary | ICD-10-CM | POA: Diagnosis not present

## 2022-09-26 DIAGNOSIS — D62 Acute posthemorrhagic anemia: Secondary | ICD-10-CM | POA: Diagnosis not present

## 2022-09-26 DIAGNOSIS — N1831 Chronic kidney disease, stage 3a: Secondary | ICD-10-CM | POA: Diagnosis not present

## 2022-09-26 DIAGNOSIS — I69393 Ataxia following cerebral infarction: Secondary | ICD-10-CM | POA: Diagnosis not present

## 2022-09-26 DIAGNOSIS — I129 Hypertensive chronic kidney disease with stage 1 through stage 4 chronic kidney disease, or unspecified chronic kidney disease: Secondary | ICD-10-CM | POA: Diagnosis not present

## 2022-09-26 DIAGNOSIS — M5136 Other intervertebral disc degeneration, lumbar region: Secondary | ICD-10-CM | POA: Diagnosis not present

## 2022-09-26 DIAGNOSIS — I739 Peripheral vascular disease, unspecified: Secondary | ICD-10-CM | POA: Diagnosis not present

## 2022-09-26 DIAGNOSIS — I2693 Single subsegmental pulmonary embolism without acute cor pulmonale: Secondary | ICD-10-CM | POA: Diagnosis not present

## 2022-09-26 DIAGNOSIS — R918 Other nonspecific abnormal finding of lung field: Secondary | ICD-10-CM | POA: Diagnosis not present

## 2022-09-26 DIAGNOSIS — G4733 Obstructive sleep apnea (adult) (pediatric): Secondary | ICD-10-CM | POA: Diagnosis not present

## 2022-09-26 DIAGNOSIS — N2889 Other specified disorders of kidney and ureter: Secondary | ICD-10-CM | POA: Diagnosis not present

## 2022-09-26 DIAGNOSIS — K5792 Diverticulitis of intestine, part unspecified, without perforation or abscess without bleeding: Secondary | ICD-10-CM | POA: Diagnosis not present

## 2022-09-26 DIAGNOSIS — M170 Bilateral primary osteoarthritis of knee: Secondary | ICD-10-CM | POA: Diagnosis not present

## 2022-09-26 DIAGNOSIS — K581 Irritable bowel syndrome with constipation: Secondary | ICD-10-CM | POA: Diagnosis not present

## 2022-09-26 DIAGNOSIS — M25461 Effusion, right knee: Secondary | ICD-10-CM | POA: Diagnosis not present

## 2022-09-26 NOTE — Telephone Encounter (Signed)
     Patient  visit on 09/15/2022  at [facility] was for hypertension, dizziness.  Have you been able to follow up with your primary care physician? Yes  The patient was or was not able to obtain any needed medicine or equipment. No medication prescribed.  Are there diet recommendations that you are having difficulty following? No  Patient expresses understanding of discharge instructions and education provided has no other needs at this time. Yes   [place] Resource Care Guide   [email or web address]  ?? [phone]   Website: [email or web address]  [email or web address]

## 2022-09-30 ENCOUNTER — Inpatient Hospital Stay: Payer: Medicare Other | Attending: Internal Medicine | Admitting: Internal Medicine

## 2022-09-30 ENCOUNTER — Inpatient Hospital Stay: Payer: Medicare Other

## 2022-09-30 VITALS — BP 181/82 | HR 89 | Temp 97.3°F | Resp 18 | Ht 72.0 in | Wt 227.1 lb

## 2022-09-30 DIAGNOSIS — R911 Solitary pulmonary nodule: Secondary | ICD-10-CM | POA: Insufficient documentation

## 2022-09-30 DIAGNOSIS — M7989 Other specified soft tissue disorders: Secondary | ICD-10-CM | POA: Diagnosis not present

## 2022-09-30 DIAGNOSIS — N281 Cyst of kidney, acquired: Secondary | ICD-10-CM | POA: Diagnosis not present

## 2022-09-30 DIAGNOSIS — I129 Hypertensive chronic kidney disease with stage 1 through stage 4 chronic kidney disease, or unspecified chronic kidney disease: Secondary | ICD-10-CM

## 2022-09-30 DIAGNOSIS — N182 Chronic kidney disease, stage 2 (mild): Secondary | ICD-10-CM

## 2022-09-30 DIAGNOSIS — I700 Atherosclerosis of aorta: Secondary | ICD-10-CM | POA: Insufficient documentation

## 2022-09-30 DIAGNOSIS — Z87891 Personal history of nicotine dependence: Secondary | ICD-10-CM | POA: Diagnosis not present

## 2022-09-30 DIAGNOSIS — I639 Cerebral infarction, unspecified: Secondary | ICD-10-CM | POA: Diagnosis not present

## 2022-09-30 DIAGNOSIS — G629 Polyneuropathy, unspecified: Secondary | ICD-10-CM | POA: Diagnosis not present

## 2022-09-30 DIAGNOSIS — G5603 Carpal tunnel syndrome, bilateral upper limbs: Secondary | ICD-10-CM | POA: Diagnosis not present

## 2022-09-30 DIAGNOSIS — I10 Essential (primary) hypertension: Secondary | ICD-10-CM | POA: Diagnosis not present

## 2022-09-30 DIAGNOSIS — Z8673 Personal history of transient ischemic attack (TIA), and cerebral infarction without residual deficits: Secondary | ICD-10-CM | POA: Diagnosis not present

## 2022-09-30 DIAGNOSIS — I2699 Other pulmonary embolism without acute cor pulmonale: Secondary | ICD-10-CM | POA: Diagnosis not present

## 2022-09-30 MED ORDER — APIXABAN 5 MG PO TABS: 5.0000 mg | ORAL_TABLET | Freq: Two times a day (BID) | ORAL | 3 refills | Status: DC

## 2022-09-30 NOTE — Progress Notes (Signed)
New patient evaluation with no acute problems/concerns today.  Will need refill on Eliquis if he is to continue.

## 2022-09-30 NOTE — Assessment & Plan Note (Addendum)
$ #   JAN 7th, 2024- Acute pulmonary embolus within segmental branch of the right upper lobe pulmonary artery and segmental branch of the left lower lobe pulmonary artery. On Eliquis  # in general, would recommend anticoagulation for in 6 to 12 months.  Could consider indefinite anticoagulation given unprovoked-however am concerned about patient risk of falls from his stroke.  Hold off any hypercoagulable workup at this time.  # Cerebellar stroke- JAN 2024 [facility]; Dr.Shah- AUG 2023]-currently on Eliquis.  Awaiting reevaluation outpatient with Dr. Manuella Ghazi.  # CKD [Dr.Kolluru]- II- III - recommend avoiding nephrotoxic NSAIDs-Advil/Motrin.  # BIlateral Leg swelling-recommend compression stockings.  No concern for any acute DVT.  Thank you Ms.Pender FNP for allowing me to participate in the care of your pleasant patient. Please do not hesitate to contact me with questions or concerns in the interim.  # DISPOSITION: #  No labs- today- # follow up in 4 months; MD; labs- cbc/cmp- Dr.B.

## 2022-09-30 NOTE — Progress Notes (Signed)
[place] NOTE  Patient Care Team: Bo Merino, FNP as PCP - General (Nurse Practitioner) Corey Skains, MD as Consulting Physician (Cardiology) Lyla Son, MD as Consulting Physician (Nephrology) Billey Co, MD as Consulting Physician (Urology)  CHIEF COMPLAINTS/PURPOSE OF CONSULTATION: DVT/PE  # IMPRESSION: 1. Exam is positive for acute pulmonary embolus within segmental branch of the right upper lobe pulmonary artery and segmental branch of the left lower lobe pulmonary artery. 2. Mild mosaic attenuation of the lungs which may reflect small airways disease. 3. Multiple complicated renal cysts, reference abdominal MRI with follow-up recommendations 07/09/2021. 4. Coronary artery calcifications noted. 5. 5 mm right upper lobe pulmonary nodule is stable from 2019 and is considered benign. 6.  Aortic Atherosclerosis (ICD10-I70.0).     Electronically Signed   By: Kerby Moors M.D.   On: 08/25/2022 08:57 Oncology History   No history exists.    HISTORY OF PRESENTING ILLNESS: Patient ambulating-with assistance.  Accompanied by family.   Jared Cline 77 y.o.  male history of poorly controlled blood pressure; history of stroke chronic kidney disease has been referred to [place] for further evaluation recommendations for PE.  Patient was recently evaluated in the emergency room in January for chest pain-CTA showed acute pulmonary embolism of right lung segmental branches. Patient started on Eliquis.  Also patient also recent diagnosed with cerebellar stroke-status post discharged to rehab.  Patient is currently discharged from rehab.  Review of Systems  Constitutional:  Positive for malaise/fatigue. Negative for chills, diaphoresis, fever and weight loss.  HENT:  Negative for nosebleeds and sore throat.   Eyes:  Negative for double vision.  Respiratory:  Negative for cough, hemoptysis, sputum production, shortness of breath and wheezing.    Cardiovascular:  Negative for chest pain, palpitations, orthopnea and leg swelling.  Gastrointestinal:  Negative for abdominal pain, blood in stool, constipation, diarrhea, heartburn, melena, nausea and vomiting.  Genitourinary:  Negative for dysuria, frequency and urgency.  Musculoskeletal:  Positive for back pain and joint pain.  Skin: Negative.  Negative for itching and rash.  Neurological:  Positive for weakness. Negative for dizziness, tingling, focal weakness and headaches.  Endo/Heme/Allergies:  Does not bruise/bleed easily.  Psychiatric/Behavioral:  Negative for depression. The patient is not nervous/anxious and does not have insomnia.      MEDICAL HISTORY:  Past Medical History:  Diagnosis Date   BPH (benign prostatic hypertrophy)    Bradycardia    evaluated by Dr. Nehemiah Massed   Cancer [facility])    prostate   Carpal tunnel syndrome, left    left arm   CKD (chronic kidney disease) stage 2, GFR 60-89 ml/min    Diverticulitis    Gastritis 2017   History of prostate cancer    HNP (herniated nucleus pulposus), lumbar    Hyperlipidemia    Hypertension    IBS (irritable bowel syndrome)    Impotence    Multiple lung nodules    nonspecific, largest 69m in Jan 2016; scanned March 2017; rescan 3-6 months later   OA (osteoarthritis) of knee    right   Renal mass    evaluated; followed by urologist   Sleep apnea    on CPAP   Torn meniscus    x 2; right knee    SURGICAL HISTORY: Past Surgical History:  Procedure Laterality Date   abdominal ultrasound  Oct 2015   Negative for eval for AAA   COLONOSCOPY     KNEE ARTHROSCOPY Right    x 2  LUMBAR LAMINECTOMY     PROSTATECTOMY     robotic    SOCIAL HISTORY: Social History   Socioeconomic History   Marital status: Married    Spouse name: Akshith Agyeman   Number of children: 3   Years of education: Not on file   Highest education level: Not on file  Occupational History   Occupation: retired [employer]  Use   Smoking status: Former    Packs/day: 0.50    Years: 9.00    Total pack years: 4.50    Types: Cigarettes    Quit date: 08/20/1971    Years since quitting: 51.1    Passive exposure: Past   Smokeless tobacco: Never  Vaping Use   Vaping Use: Never used  Substance and Sexual Activity   Alcohol use: No    Alcohol/week: 0.0 standard drinks of alcohol   Drug use: No   Sexual activity: Not Currently  Other Topics Concern   Not on file  Social History Narrative   Retired from [employer] 2012.   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    Social Determinants of Health   Financial Resource Strain: Low Risk  (07/09/2022)   Overall Financial Resource Strain (CARDIA)    Difficulty of Paying Living Expenses: Not hard at all  Food Insecurity: Food Insecurity Present (09/30/2022)   Hunger Vital Sign    Worried About Running Out of Food in the Last Year: Never true    Ran Out of Food in the Last Year: Sometimes true  Transportation Needs: No Transportation Needs (09/30/2022)   PRAPARE - Hydrologist (Medical): No    Lack of Transportation (Non-Medical): No  Physical Activity: Inactive (07/05/2021)   Exercise Vital Sign    Days of Exercise per Week: 0 days    Minutes of Exercise per Session: 0 min  Stress: No Stress Concern Present (07/09/2022)   [place]    Feeling of Stress : Not at all  Social Connections: [place] (07/09/2022)   Social Connection and Isolation Panel [NHANES]    Frequency of Communication with Friends and Family: Three times a week    Frequency of Social Gatherings with Friends and Family: Once a week    Attends Religious Services: More than 4 times per year    Active Member of [employer] or Organizations: Not on file    Attends Archivist Meetings: More than 4 times per year    Marital Status: Married  Human resources officer Violence: Not  At Risk (09/30/2022)   Humiliation, Afraid, Rape, and Kick questionnaire    Fear of Current or Ex-Partner: No    Emotionally Abused: No    Physically Abused: No    Sexually Abused: No    FAMILY HISTORY: Family History  Problem Relation Age of Onset   Stroke Mother    Hypertension Mother    Aneurysm Father        cerebral   Hypertension Father    Heart disease Paternal Uncle    Alcohol abuse Brother    Cancer Neg Hx    COPD Neg Hx    Diabetes Neg Hx     ALLERGIES:  is allergic to flomax [tamsulosin hcl], ace inhibitors, amlodipine, doxazosin, and lipitor [atorvastatin].  MEDICATIONS:  Current Outpatient Medications  Medication Sig Dispense Refill   acetaminophen (TYLENOL) 650 MG CR tablet Take 2 tablets (1,300 mg total) by mouth  every 12 (twelve) hours as needed (Mild pain).     cholecalciferol (VITAMIN D3) 25 MCG (1000 UNIT) tablet Take 1,000 Units by mouth daily.     CINNAMON PO Take by mouth.     cloNIDine (CATAPRES) 0.3 MG tablet Take 1 tablet (0.3 mg total) by mouth 2 (two) times daily. 60 tablet 0   cyanocobalamin (VITAMIN B12) 1000 MCG tablet Take 1,000 mcg by mouth daily.     diclofenac Sodium (VOLTAREN) 1 % GEL Apply 2 g topically 4 (four) times daily. [id number] g 0   GARLIC PO Take by mouth.     hydrALAZINE (APRESOLINE) 10 MG tablet Take 1 tablet (10 mg total) by mouth 2 (two) times daily. 60 tablet 0   olmesartan (BENICAR) 20 MG tablet Take 20 mg by mouth daily.     oxyCODONE (OXY IR/ROXICODONE) 5 MG immediate release tablet Take 1 tablet (5 mg total) by mouth every 4 (four) hours as needed for severe pain. 20 tablet 0   polyethylene glycol powder (GLYCOLAX/MIRALAX) 17 GM/SCOOP powder Take 1 capful (17 g) by mouth 2 (two) times daily. 238 g 0   pravastatin (PRAVACHOL) 80 MG tablet Take 1 tablet (80 mg total) by mouth at bedtime. 30 tablet 0   scopolamine (TRANSDERM-SCOP) 1 MG/3DAYS Place 1 patch (1.5 mg total) onto the skin every 3 (three) days. 10 patch 0   senna-docusate  (SENOKOT-S) 8.6-50 MG tablet Take 3 tablets by mouth daily at 6 (six) AM. 90 tablet 0   spironolactone (ALDACTONE) 25 MG tablet TAKE 1 TABLET(25 MG) BY MOUTH DAILY 30 tablet 0   TURMERIC PO Take by mouth.     apixaban (ELIQUIS) 5 MG TABS tablet Take 1 tablet (5 mg total) by mouth 2 (two) times daily. 60 tablet 3   sodium chloride 1 g tablet Take 1 tablet (1 g total) by mouth 2 (two) times daily with a meal. (Patient not taking: Reported on 09/30/2022) 60 tablet 0   traZODone (DESYREL) 50 MG tablet Take 0.5-1 tablets (25-50 mg total) by mouth at bedtime as needed for sleep. (Patient not taking: Reported on 09/30/2022) 15 tablet 0   No current facility-administered medications for this visit.       PHYSICAL EXAMINATION:  Vitals:   09/30/22 1100  BP: (!) 181/82  Pulse: 89  Resp: 18  Temp: (!) 97.3 F (36.3 C)   Filed Weights   09/30/22 1100  Weight: 227 lb 1.6 oz (103 kg)    Physical Exam Vitals and nursing note reviewed.  HENT:     Head: Normocephalic and atraumatic.     Mouth/Throat:     Pharynx: Oropharynx is clear.  Eyes:     Extraocular Movements: Extraocular movements intact.     Pupils: Pupils are equal, round, and reactive to light.  Cardiovascular:     Rate and Rhythm: Normal rate and regular rhythm.  Pulmonary:     Comments: Decreased breath sounds bilaterally.  Abdominal:     Palpations: Abdomen is soft.  Musculoskeletal:        General: Normal range of motion.     Cervical back: Normal range of motion.  Skin:    General: Skin is warm.  Neurological:     General: No focal deficit present.     Mental Status: He is alert and oriented to person, place, and time.  Psychiatric:        Behavior: Behavior normal.        Judgment: Judgment normal.  LABORATORY DATA:  I have reviewed the data as listed Lab Results  Component Value Date   WBC 4.8 09/15/2022   HGB 11.9 (L) 09/15/2022   HCT 36.7 (L) 09/15/2022   MCV 84.2 09/15/2022   PLT 320 09/15/2022    Recent Labs    08/24/22 0347 08/24/22 0557 08/30/22 0556 09/02/22 0627 09/09/22 0611 09/10/22 0810 09/15/22 1006  NA  --    < > 130*   < > 130* 133* 132*  K  --    < > 4.3   < > 4.2 4.3 3.7  CL  --    < > 100   < > 101 102 105  CO2  --    < > 21*   < > 24 26 24  $ GLUCOSE  --    < > 94   < > 92 121* 97  BUN  --    < > 24*   < > 19 16 12  $ CREATININE  --    < > 1.22   < > 1.30* 1.31* 1.16  CALCIUM  --    < > 9.1   < > 9.1 9.2 9.3  GFRNONAA  --    < > >60   < > 57* 56* >60  PROT 7.2  --  6.8  --   --   --  7.1  ALBUMIN 3.9  --  3.4*  --   --   --  3.8  AST 17  --  12*  --   --   --  17  ALT 9  --  8  --   --   --  12  ALKPHOS 50  --  49  --   --   --  81  BILITOT 0.9  --  0.7  --   --   --  0.5  BILIDIR 0.3*  --   --   --   --   --   --   IBILI 0.6  --   --   --   --   --   --    < > = values in this interval not displayed.    RADIOGRAPHIC STUDIES: I have personally reviewed the radiological images as listed and agreed with the findings in the report. CT ANGIO HEAD NECK W WO CM  Result Date: 09/15/2022 CLINICAL DATA:  77 year old male with dizziness, neurologic deficit. Hypertension. Lower extremity swelling. Left vertebral artery occluded in the neck on MRA earlier this month. EXAM: CT ANGIOGRAPHY HEAD AND NECK TECHNIQUE: Multidetector CT imaging of the head and neck was performed using the standard protocol during bolus administration of intravenous contrast. Multiplanar CT image reconstructions and MIPs were obtained to evaluate the vascular anatomy. Carotid stenosis measurements (when applicable) are obtained utilizing NASCET criteria, using the distal internal carotid diameter as the denominator. RADIATION DOSE REDUCTION: This exam was performed according to the departmental dose-optimization program which includes automated exposure control, adjustment of the mA and/or kV according to patient size and/or use of iterative reconstruction technique. CONTRAST:  69m OMNIPAQUE  IOHEXOL 350 MG/ML SOLN COMPARISON:  Brain MRI 1111 hours today. Recent head and neck MRA 08/25/2022. FINDINGS: CT HEAD Brain: Stable non contrast CT appearance of the brain compared to 08/24/2022. No intracranial mass effect, hemorrhage, or acute cortically based infarct. Calvarium and skull base: No acute osseous abnormality identified. Paranasal sinuses: Visualized paranasal sinuses and mastoids are clear. Orbits: No acute orbit or scalp soft tissue finding. CTA NECK  Skeleton: Widespread cervical disc and endplate degeneration with bulky endplate spurring. Subsequent multilevel cervical spinal stenosis suspected. No acute osseous abnormality identified. Upper chest: Negative. Other neck: Retropharyngeal course of both carotids. No acute finding identified. Aortic arch: Calcified aortic atherosclerosis. 3 vessel arch configuration. Right carotid system: Soft and calcified brachiocephalic and right CCA origin atherosclerosis with no significant stenosis. Retropharyngeal right carotid bifurcation and heavily calcified proximal right ICA with subsequent string sign stenosis over a segment of approximately 7 mm (series 9, image 42). But the vessel remains patent to the skull base. Visible right ICA siphon is patent with only mild calcified plaque through the anterior genu. Left carotid system: Left CCA origin plaque. And bulky soft plaque in the left CCA at the level of the hyoid bone series 9, image 70, but less than 50 % stenosis with respect to the distal vessel. Patent retropharyngeal carotid bifurcation but with bulky calcified plaque in the right ICA origin and bulb. Superimposed soft plaque also in the distal bulb. Subsequent stenosis estimated at 60 % with respect to the distal vessel. See series 9, image 46 and series 13, image 94). Vessel remains patent to the skull base. Visible left ICA siphon is patent through the anterior genu where there is moderate calcified plaque. Vertebral arteries: Proximal right  subclavian artery and right vertebral artery origin plaque. Right vertebral origin remains patent with up to moderate stenosis. Right vertebral is somewhat diminutive and irregular to the skull base but patent without significant stenosis. Proximal left subclavian artery plaque and the left vertebral artery origin is occluded. No significant reconstitution of the vessel in the neck. CTA HEAD Due to scanner malfunction, intracranial CTA image acquisition was delayed by 1 minute and subsequently arterial contrast bolus is poor. Posterior circulation: Both distal vertebral arteries are patent but diminutive and irregular. Left V4 likely supplied in a retrograde fashion from the vertebrobasilar junction. There is multifocal severe basilar artery stenosis demonstrated on series 11, images 12 and 3 both proximal and distal to a patent right AICA origin. This appearance is similar to the MRA earlier this month. On the intracranial thin images series 19 the basilar tip does appear to remain patent along with fetal type PCA origins and SCA origins. PCA branch detail is poor. Anterior circulation: Proximal ICA siphons well demonstrated on series 11 and remain patent. Bulky supraclinoid calcified plaque on the left results in moderate to severe stenosis on series 19. Both carotid termini remain patent. MCA and ACA origins are patent. But branch detail is poor. Venous sinuses: Patent, with venous dominant contrast timing on series 19. Anatomic variants: Fetal type bilateral PCA origins. Review of the MIP images confirms the above findings IMPRESSION: 1. Scanner malfunction resulting in delayed intracranial CTA acquisition, subsequently poor intracranial bolus timing. Neck CTA images are diagnostic and include through the mid ICA siphons and the mid Basilar artery. 2. Positive for: - Tandem Severe Basilar Artery Stenosis superimposed on chronic left vertebral artery occlusion, at least moderate right vertebral artery origin  stenosis, and hypoplastic basilar artery due to fetal PCA origins. - RADIOGRAPHIC STRING SIGN STENOSIS of the Right ICA origin due to bulky calcified plaque. - contralateral estimated 60% Left ICA bulb stenosis, and moderate to severe stenosis of the supraclinoid Left ICA due to bulky calcified plaque. - conspicuous low-density soft plaque in the Left CCA not resulting in stenosis. 3. Stable CT appearance of the brain since 08/24/2022. No acute intracranial abnormality. 4.  Aortic Atherosclerosis (ICD10-I70.0). Electronically Signed  By: Genevie Ann M.D.   On: 09/15/2022 12:12   MR BRAIN WO CONTRAST  Result Date: 09/15/2022 CLINICAL DATA:  MR head 08/24/2022 EXAM: MRI HEAD WITHOUT CONTRAST TECHNIQUE: Multiplanar, multiecho pulse sequences of the brain and surrounding structures were obtained without intravenous contrast. COMPARISON:  MR head without contrast 08/24/2022 FINDINGS: Brain: The diffusion-weighted images demonstrate no acute or subacute infarct. Focal diffusion signal along the posterior aspect of the right pons and cerebellar peduncle is less intense on today's study. No associated T2 or FLAIR signal abnormality is present. Mild diffuse periventricular and scattered subcortical T2 hyperintensities are otherwise stable. Mild generalized atrophy is stable. No other acute abnormality is present. The ventricles are of normal size. No significant extraaxial fluid collection is present. The brainstem and cerebellum are otherwise within normal limits. Midline structures are within normal limits. Vascular: Flow is present in the major intracranial arteries. Skull and upper cervical spine: The craniocervical junction is normal. Upper cervical spine is within normal limits. Marrow signal is unremarkable. Sinuses/Orbits: The paranasal sinuses and mastoid air cells are clear. The globes and orbits are within normal limits. IMPRESSION: 1. No acute intracranial abnormality or significant interval change. 2.  Previously noted right cerebellar peduncle punctate infarct is only faintly seen on today's study without significant T2 or FLAIR changes. 3. Mild diffuse periventricular and scattered subcortical T2 hyperintensities are otherwise stable. This likely reflects the sequela of chronic microvascular ischemia. Electronically Signed   By: San Morelle M.D.   On: 09/15/2022 11:44   DG Chest 2 View  Result Date: 09/15/2022 CLINICAL DATA:  Chest pain EXAM: CHEST - 2 VIEW COMPARISON:  Chest x-ray May 28, 2012 FINDINGS: The heart size and mediastinal contours are within normal limits. Both lungs are clear. The visualized skeletal structures are unremarkable. IMPRESSION: No active cardiopulmonary disease. Electronically Signed   By: Dorise Bullion III M.D.   On: 09/15/2022 11:14    ASSESSMENT & PLAN:   Acute pulmonary embolism without acute cor pulmonale (HCC) $ # JAN 7th, 2024- Acute pulmonary embolus within segmental branch of the right upper lobe pulmonary artery and segmental branch of the left lower lobe pulmonary artery. On Eliquis  # in general, would recommend anticoagulation for in 6 to 12 months.  Could consider indefinite anticoagulation given unprovoked-however am concerned about patient risk of falls from his stroke.  Hold off any hypercoagulable workup at this time.  # Cerebellar stroke- JAN 2024 [facility]; Dr.Shah- AUG 2023]-currently on Eliquis.  Awaiting reevaluation outpatient with Dr. Manuella Ghazi.  # CKD [Dr.Kolluru]- II- III - recommend avoiding nephrotoxic NSAIDs-Advil/Motrin.  # BIlateral Leg swelling-recommend compression stockings.  No concern for any acute DVT.  Thank you Ms.Pender FNP for allowing me to participate in the care of your pleasant patient. Please do not hesitate to contact me with questions or concerns in the interim.  # DISPOSITION: #  No labs- today- # follow up in 4 months; MD; labs- cbc/cmp- Dr.B.   All questions were answered. The patient knows to call the  clinic with any problems, questions or concerns.    Cammie Sickle, MD 10/04/2022 2:07 PM

## 2022-10-01 DIAGNOSIS — R918 Other nonspecific abnormal finding of lung field: Secondary | ICD-10-CM | POA: Diagnosis not present

## 2022-10-01 DIAGNOSIS — E785 Hyperlipidemia, unspecified: Secondary | ICD-10-CM | POA: Diagnosis not present

## 2022-10-01 DIAGNOSIS — I739 Peripheral vascular disease, unspecified: Secondary | ICD-10-CM | POA: Diagnosis not present

## 2022-10-01 DIAGNOSIS — K5792 Diverticulitis of intestine, part unspecified, without perforation or abscess without bleeding: Secondary | ICD-10-CM | POA: Diagnosis not present

## 2022-10-01 DIAGNOSIS — M170 Bilateral primary osteoarthritis of knee: Secondary | ICD-10-CM | POA: Diagnosis not present

## 2022-10-01 DIAGNOSIS — K581 Irritable bowel syndrome with constipation: Secondary | ICD-10-CM | POA: Diagnosis not present

## 2022-10-01 DIAGNOSIS — I129 Hypertensive chronic kidney disease with stage 1 through stage 4 chronic kidney disease, or unspecified chronic kidney disease: Secondary | ICD-10-CM | POA: Diagnosis not present

## 2022-10-01 DIAGNOSIS — N1831 Chronic kidney disease, stage 3a: Secondary | ICD-10-CM | POA: Diagnosis not present

## 2022-10-01 DIAGNOSIS — M25461 Effusion, right knee: Secondary | ICD-10-CM | POA: Diagnosis not present

## 2022-10-01 DIAGNOSIS — G4733 Obstructive sleep apnea (adult) (pediatric): Secondary | ICD-10-CM | POA: Diagnosis not present

## 2022-10-01 DIAGNOSIS — N2889 Other specified disorders of kidney and ureter: Secondary | ICD-10-CM | POA: Diagnosis not present

## 2022-10-01 DIAGNOSIS — I2693 Single subsegmental pulmonary embolism without acute cor pulmonale: Secondary | ICD-10-CM | POA: Diagnosis not present

## 2022-10-01 DIAGNOSIS — M5136 Other intervertebral disc degeneration, lumbar region: Secondary | ICD-10-CM | POA: Diagnosis not present

## 2022-10-01 DIAGNOSIS — D62 Acute posthemorrhagic anemia: Secondary | ICD-10-CM | POA: Diagnosis not present

## 2022-10-01 DIAGNOSIS — G5602 Carpal tunnel syndrome, left upper limb: Secondary | ICD-10-CM | POA: Diagnosis not present

## 2022-10-01 DIAGNOSIS — I69393 Ataxia following cerebral infarction: Secondary | ICD-10-CM | POA: Diagnosis not present

## 2022-10-02 DIAGNOSIS — I2699 Other pulmonary embolism without acute cor pulmonale: Secondary | ICD-10-CM | POA: Diagnosis not present

## 2022-10-02 DIAGNOSIS — E782 Mixed hyperlipidemia: Secondary | ICD-10-CM | POA: Diagnosis not present

## 2022-10-02 DIAGNOSIS — I639 Cerebral infarction, unspecified: Secondary | ICD-10-CM | POA: Diagnosis not present

## 2022-10-02 DIAGNOSIS — I10 Essential (primary) hypertension: Secondary | ICD-10-CM | POA: Diagnosis not present

## 2022-10-03 DIAGNOSIS — M5136 Other intervertebral disc degeneration, lumbar region: Secondary | ICD-10-CM | POA: Diagnosis not present

## 2022-10-03 DIAGNOSIS — G5602 Carpal tunnel syndrome, left upper limb: Secondary | ICD-10-CM | POA: Diagnosis not present

## 2022-10-03 DIAGNOSIS — K5792 Diverticulitis of intestine, part unspecified, without perforation or abscess without bleeding: Secondary | ICD-10-CM | POA: Diagnosis not present

## 2022-10-03 DIAGNOSIS — I129 Hypertensive chronic kidney disease with stage 1 through stage 4 chronic kidney disease, or unspecified chronic kidney disease: Secondary | ICD-10-CM | POA: Diagnosis not present

## 2022-10-03 DIAGNOSIS — I69393 Ataxia following cerebral infarction: Secondary | ICD-10-CM | POA: Diagnosis not present

## 2022-10-03 DIAGNOSIS — R918 Other nonspecific abnormal finding of lung field: Secondary | ICD-10-CM | POA: Diagnosis not present

## 2022-10-03 DIAGNOSIS — D62 Acute posthemorrhagic anemia: Secondary | ICD-10-CM | POA: Diagnosis not present

## 2022-10-03 DIAGNOSIS — N2889 Other specified disorders of kidney and ureter: Secondary | ICD-10-CM | POA: Diagnosis not present

## 2022-10-03 DIAGNOSIS — I739 Peripheral vascular disease, unspecified: Secondary | ICD-10-CM | POA: Diagnosis not present

## 2022-10-03 DIAGNOSIS — K581 Irritable bowel syndrome with constipation: Secondary | ICD-10-CM | POA: Diagnosis not present

## 2022-10-03 DIAGNOSIS — M25461 Effusion, right knee: Secondary | ICD-10-CM | POA: Diagnosis not present

## 2022-10-03 DIAGNOSIS — N1831 Chronic kidney disease, stage 3a: Secondary | ICD-10-CM | POA: Diagnosis not present

## 2022-10-03 DIAGNOSIS — M170 Bilateral primary osteoarthritis of knee: Secondary | ICD-10-CM | POA: Diagnosis not present

## 2022-10-03 DIAGNOSIS — E785 Hyperlipidemia, unspecified: Secondary | ICD-10-CM | POA: Diagnosis not present

## 2022-10-03 DIAGNOSIS — G4733 Obstructive sleep apnea (adult) (pediatric): Secondary | ICD-10-CM | POA: Diagnosis not present

## 2022-10-03 DIAGNOSIS — I2693 Single subsegmental pulmonary embolism without acute cor pulmonale: Secondary | ICD-10-CM | POA: Diagnosis not present

## 2022-10-04 DIAGNOSIS — N2889 Other specified disorders of kidney and ureter: Secondary | ICD-10-CM | POA: Diagnosis not present

## 2022-10-04 DIAGNOSIS — G4733 Obstructive sleep apnea (adult) (pediatric): Secondary | ICD-10-CM | POA: Diagnosis not present

## 2022-10-04 DIAGNOSIS — M25461 Effusion, right knee: Secondary | ICD-10-CM | POA: Diagnosis not present

## 2022-10-04 DIAGNOSIS — M5136 Other intervertebral disc degeneration, lumbar region: Secondary | ICD-10-CM | POA: Diagnosis not present

## 2022-10-04 DIAGNOSIS — M170 Bilateral primary osteoarthritis of knee: Secondary | ICD-10-CM | POA: Diagnosis not present

## 2022-10-04 DIAGNOSIS — K5792 Diverticulitis of intestine, part unspecified, without perforation or abscess without bleeding: Secondary | ICD-10-CM | POA: Diagnosis not present

## 2022-10-04 DIAGNOSIS — D62 Acute posthemorrhagic anemia: Secondary | ICD-10-CM | POA: Diagnosis not present

## 2022-10-04 DIAGNOSIS — E785 Hyperlipidemia, unspecified: Secondary | ICD-10-CM | POA: Diagnosis not present

## 2022-10-04 DIAGNOSIS — I129 Hypertensive chronic kidney disease with stage 1 through stage 4 chronic kidney disease, or unspecified chronic kidney disease: Secondary | ICD-10-CM | POA: Diagnosis not present

## 2022-10-04 DIAGNOSIS — N1831 Chronic kidney disease, stage 3a: Secondary | ICD-10-CM | POA: Diagnosis not present

## 2022-10-04 DIAGNOSIS — G5602 Carpal tunnel syndrome, left upper limb: Secondary | ICD-10-CM | POA: Diagnosis not present

## 2022-10-04 DIAGNOSIS — I69393 Ataxia following cerebral infarction: Secondary | ICD-10-CM | POA: Diagnosis not present

## 2022-10-04 DIAGNOSIS — I2693 Single subsegmental pulmonary embolism without acute cor pulmonale: Secondary | ICD-10-CM | POA: Diagnosis not present

## 2022-10-04 DIAGNOSIS — K581 Irritable bowel syndrome with constipation: Secondary | ICD-10-CM | POA: Diagnosis not present

## 2022-10-04 DIAGNOSIS — I739 Peripheral vascular disease, unspecified: Secondary | ICD-10-CM | POA: Diagnosis not present

## 2022-10-04 DIAGNOSIS — R918 Other nonspecific abnormal finding of lung field: Secondary | ICD-10-CM | POA: Diagnosis not present

## 2022-10-08 DIAGNOSIS — I2693 Single subsegmental pulmonary embolism without acute cor pulmonale: Secondary | ICD-10-CM | POA: Diagnosis not present

## 2022-10-08 DIAGNOSIS — I69393 Ataxia following cerebral infarction: Secondary | ICD-10-CM | POA: Diagnosis not present

## 2022-10-08 DIAGNOSIS — K581 Irritable bowel syndrome with constipation: Secondary | ICD-10-CM | POA: Diagnosis not present

## 2022-10-08 DIAGNOSIS — G5602 Carpal tunnel syndrome, left upper limb: Secondary | ICD-10-CM | POA: Diagnosis not present

## 2022-10-08 DIAGNOSIS — M5136 Other intervertebral disc degeneration, lumbar region: Secondary | ICD-10-CM | POA: Diagnosis not present

## 2022-10-08 DIAGNOSIS — M170 Bilateral primary osteoarthritis of knee: Secondary | ICD-10-CM | POA: Diagnosis not present

## 2022-10-08 DIAGNOSIS — N1831 Chronic kidney disease, stage 3a: Secondary | ICD-10-CM | POA: Diagnosis not present

## 2022-10-08 DIAGNOSIS — G4733 Obstructive sleep apnea (adult) (pediatric): Secondary | ICD-10-CM | POA: Diagnosis not present

## 2022-10-08 DIAGNOSIS — R918 Other nonspecific abnormal finding of lung field: Secondary | ICD-10-CM | POA: Diagnosis not present

## 2022-10-08 DIAGNOSIS — N2889 Other specified disorders of kidney and ureter: Secondary | ICD-10-CM | POA: Diagnosis not present

## 2022-10-08 DIAGNOSIS — I129 Hypertensive chronic kidney disease with stage 1 through stage 4 chronic kidney disease, or unspecified chronic kidney disease: Secondary | ICD-10-CM | POA: Diagnosis not present

## 2022-10-08 DIAGNOSIS — E785 Hyperlipidemia, unspecified: Secondary | ICD-10-CM | POA: Diagnosis not present

## 2022-10-08 DIAGNOSIS — I739 Peripheral vascular disease, unspecified: Secondary | ICD-10-CM | POA: Diagnosis not present

## 2022-10-08 DIAGNOSIS — D62 Acute posthemorrhagic anemia: Secondary | ICD-10-CM | POA: Diagnosis not present

## 2022-10-08 DIAGNOSIS — M25461 Effusion, right knee: Secondary | ICD-10-CM | POA: Diagnosis not present

## 2022-10-08 DIAGNOSIS — K5792 Diverticulitis of intestine, part unspecified, without perforation or abscess without bleeding: Secondary | ICD-10-CM | POA: Diagnosis not present

## 2022-10-10 DIAGNOSIS — M5136 Other intervertebral disc degeneration, lumbar region: Secondary | ICD-10-CM | POA: Diagnosis not present

## 2022-10-10 DIAGNOSIS — I129 Hypertensive chronic kidney disease with stage 1 through stage 4 chronic kidney disease, or unspecified chronic kidney disease: Secondary | ICD-10-CM | POA: Diagnosis not present

## 2022-10-10 DIAGNOSIS — R918 Other nonspecific abnormal finding of lung field: Secondary | ICD-10-CM | POA: Diagnosis not present

## 2022-10-10 DIAGNOSIS — D62 Acute posthemorrhagic anemia: Secondary | ICD-10-CM | POA: Diagnosis not present

## 2022-10-10 DIAGNOSIS — N1831 Chronic kidney disease, stage 3a: Secondary | ICD-10-CM | POA: Diagnosis not present

## 2022-10-10 DIAGNOSIS — M25461 Effusion, right knee: Secondary | ICD-10-CM | POA: Diagnosis not present

## 2022-10-10 DIAGNOSIS — G5602 Carpal tunnel syndrome, left upper limb: Secondary | ICD-10-CM | POA: Diagnosis not present

## 2022-10-10 DIAGNOSIS — M170 Bilateral primary osteoarthritis of knee: Secondary | ICD-10-CM | POA: Diagnosis not present

## 2022-10-10 DIAGNOSIS — G4733 Obstructive sleep apnea (adult) (pediatric): Secondary | ICD-10-CM | POA: Diagnosis not present

## 2022-10-10 DIAGNOSIS — K581 Irritable bowel syndrome with constipation: Secondary | ICD-10-CM | POA: Diagnosis not present

## 2022-10-10 DIAGNOSIS — I739 Peripheral vascular disease, unspecified: Secondary | ICD-10-CM | POA: Diagnosis not present

## 2022-10-10 DIAGNOSIS — N2889 Other specified disorders of kidney and ureter: Secondary | ICD-10-CM | POA: Diagnosis not present

## 2022-10-10 DIAGNOSIS — K5792 Diverticulitis of intestine, part unspecified, without perforation or abscess without bleeding: Secondary | ICD-10-CM | POA: Diagnosis not present

## 2022-10-10 DIAGNOSIS — E785 Hyperlipidemia, unspecified: Secondary | ICD-10-CM | POA: Diagnosis not present

## 2022-10-10 DIAGNOSIS — I69393 Ataxia following cerebral infarction: Secondary | ICD-10-CM | POA: Diagnosis not present

## 2022-10-10 DIAGNOSIS — I2693 Single subsegmental pulmonary embolism without acute cor pulmonale: Secondary | ICD-10-CM | POA: Diagnosis not present

## 2022-10-11 ENCOUNTER — Other Ambulatory Visit: Payer: Self-pay | Admitting: Nurse Practitioner

## 2022-10-11 DIAGNOSIS — N1831 Chronic kidney disease, stage 3a: Secondary | ICD-10-CM | POA: Diagnosis not present

## 2022-10-11 DIAGNOSIS — N400 Enlarged prostate without lower urinary tract symptoms: Secondary | ICD-10-CM | POA: Diagnosis not present

## 2022-10-11 DIAGNOSIS — Z79891 Long term (current) use of opiate analgesic: Secondary | ICD-10-CM | POA: Diagnosis not present

## 2022-10-11 DIAGNOSIS — E785 Hyperlipidemia, unspecified: Secondary | ICD-10-CM | POA: Diagnosis not present

## 2022-10-11 DIAGNOSIS — I69393 Ataxia following cerebral infarction: Secondary | ICD-10-CM | POA: Diagnosis not present

## 2022-10-11 DIAGNOSIS — G4733 Obstructive sleep apnea (adult) (pediatric): Secondary | ICD-10-CM | POA: Diagnosis not present

## 2022-10-11 DIAGNOSIS — Z8546 Personal history of malignant neoplasm of prostate: Secondary | ICD-10-CM | POA: Diagnosis not present

## 2022-10-11 DIAGNOSIS — R42 Dizziness and giddiness: Secondary | ICD-10-CM | POA: Diagnosis not present

## 2022-10-11 DIAGNOSIS — N2889 Other specified disorders of kidney and ureter: Secondary | ICD-10-CM | POA: Diagnosis not present

## 2022-10-11 DIAGNOSIS — Z9181 History of falling: Secondary | ICD-10-CM | POA: Diagnosis not present

## 2022-10-11 DIAGNOSIS — H5509 Other forms of nystagmus: Secondary | ICD-10-CM | POA: Diagnosis not present

## 2022-10-11 DIAGNOSIS — I2693 Single subsegmental pulmonary embolism without acute cor pulmonale: Secondary | ICD-10-CM | POA: Diagnosis not present

## 2022-10-11 DIAGNOSIS — D62 Acute posthemorrhagic anemia: Secondary | ICD-10-CM | POA: Diagnosis not present

## 2022-10-11 DIAGNOSIS — R296 Repeated falls: Secondary | ICD-10-CM | POA: Diagnosis not present

## 2022-10-11 DIAGNOSIS — M170 Bilateral primary osteoarthritis of knee: Secondary | ICD-10-CM | POA: Diagnosis not present

## 2022-10-11 DIAGNOSIS — K5792 Diverticulitis of intestine, part unspecified, without perforation or abscess without bleeding: Secondary | ICD-10-CM | POA: Diagnosis not present

## 2022-10-11 DIAGNOSIS — E782 Mixed hyperlipidemia: Secondary | ICD-10-CM

## 2022-10-11 DIAGNOSIS — I739 Peripheral vascular disease, unspecified: Secondary | ICD-10-CM | POA: Diagnosis not present

## 2022-10-11 DIAGNOSIS — G5602 Carpal tunnel syndrome, left upper limb: Secondary | ICD-10-CM | POA: Diagnosis not present

## 2022-10-11 DIAGNOSIS — E871 Hypo-osmolality and hyponatremia: Secondary | ICD-10-CM | POA: Diagnosis not present

## 2022-10-11 DIAGNOSIS — K581 Irritable bowel syndrome with constipation: Secondary | ICD-10-CM | POA: Diagnosis not present

## 2022-10-11 DIAGNOSIS — I129 Hypertensive chronic kidney disease with stage 1 through stage 4 chronic kidney disease, or unspecified chronic kidney disease: Secondary | ICD-10-CM | POA: Diagnosis not present

## 2022-10-11 DIAGNOSIS — R918 Other nonspecific abnormal finding of lung field: Secondary | ICD-10-CM | POA: Diagnosis not present

## 2022-10-11 DIAGNOSIS — M25461 Effusion, right knee: Secondary | ICD-10-CM | POA: Diagnosis not present

## 2022-10-11 DIAGNOSIS — Z7901 Long term (current) use of anticoagulants: Secondary | ICD-10-CM | POA: Diagnosis not present

## 2022-10-11 DIAGNOSIS — M5136 Other intervertebral disc degeneration, lumbar region: Secondary | ICD-10-CM | POA: Diagnosis not present

## 2022-10-11 MED ORDER — PRAVASTATIN SODIUM 80 MG PO TABS: 80.0000 mg | ORAL_TABLET | Freq: Every day | ORAL | 1 refills | Status: DC

## 2022-10-11 NOTE — Telephone Encounter (Signed)
Medication Refill - Medication:  pravastatin (PRAVACHOL) 80 MG tablet     Has the patient contacted their pharmacy? No    Preferred Pharmacy (with phone number or street name):  [facility] [id number], [place] Phone: [phone]  Fax: [phone]      Has the patient been seen for an appointment in the last year OR does the patient have an upcoming appointment? Yes.    Agent: Please be advised that RX refills may take up to 3 business days. We ask that you follow-up with your pharmacy.

## 2022-10-11 NOTE — Telephone Encounter (Signed)
Requested medication (s) are due for refill today -yes  Requested medication (s) are on the active medication list -yes  Future visit scheduled -no  Last refill: 09/09/22 #30  Notes to clinic: outside provider  Requested Prescriptions  Pending Prescriptions Disp Refills   pravastatin (PRAVACHOL) 80 MG tablet 30 tablet 0    Sig: Take 1 tablet (80 mg total) by mouth at bedtime.     Cardiovascular:  Antilipid - Statins Failed - 10/11/2022  2:07 PM      Failed - Lipid Panel in normal range within the last 12 months    Cholesterol, Total  Date Value Ref Range Status  10/19/2015 174 100 - 199 mg/dL Final   Cholesterol  Date Value Ref Range Status  08/25/2022 149 0 - 200 mg/dL Final   LDL Cholesterol (Calc)  Date Value Ref Range Status  10/05/2021 151 (H) mg/dL (calc) Final    Comment:    Reference range: <100 . Desirable range <100 mg/dL for primary prevention;   <70 mg/dL for patients with CHD or diabetic patients  with > or = 2 CHD risk factors. [name] LDL-C is now calculated using the Martin-Hopkins  calculation, which is a validated novel method providing  better accuracy than the Friedewald equation in the  estimation of LDL-C.  Cresenciano Genre et al. Annamaria Helling. [id number]): 2061-2068  (http://education.[facility].com/faq/FAQ164)    LDL Cholesterol  Date Value Ref Range Status  08/25/2022 86 0 - 99 mg/dL Final    Comment:           Total Cholesterol/HDL:CHD Risk Coronary Heart Disease Risk Table                     Men   Women  1/2 Average Risk   3.4   3.3  Average Risk       5.0   4.4  2 X Average Risk   9.6   7.1  3 X Average Risk  23.4   11.0        Use the calculated Patient Ratio above and the CHD Risk Table to determine the patient's CHD Risk.        ATP III CLASSIFICATION (LDL):  <100     mg/dL   Optimal  100-129  mg/dL   Near or Above                    Optimal  130-159  mg/dL   Borderline  160-189  mg/dL   High  >190     mg/dL   Very High Performed at  [facility], [place], [place] [address]    HDL  Date Value Ref Range Status  08/25/2022 47 >40 mg/dL Final  10/19/2015 48 >39 mg/dL Final   Triglycerides  Date Value Ref Range Status  08/25/2022 78 <150 mg/dL Final         Passed - Patient is not pregnant      Passed - Valid encounter within last 12 months    Recent Outpatient Visits           2 weeks ago History of CVA (cerebrovascular accident)   [facility] Bo Merino, FNP   2 months ago PVD (peripheral vascular disease) [facility])   [facility] Bo Merino, FNP   6 months ago OSA on CPAP   [facility] Bo Merino, FNP   6 months ago Primary hypertension  [facility] Bo Merino, FNP   7 months ago Dizziness   [facility] Serafina Royals F, [place]                 Requested Prescriptions  Pending Prescriptions Disp Refills   pravastatin (PRAVACHOL) 80 MG tablet 30 tablet 0    Sig: Take 1 tablet (80 mg total) by mouth at bedtime.     Cardiovascular:  Antilipid - Statins Failed - 10/11/2022  2:07 PM      Failed - Lipid Panel in normal range within the last 12 months    Cholesterol, Total  Date Value Ref Range Status  10/19/2015 174 100 - 199 mg/dL Final   Cholesterol  Date Value Ref Range Status  08/25/2022 149 0 - 200 mg/dL Final   LDL Cholesterol (Calc)  Date Value Ref Range Status  10/05/2021 151 (H) mg/dL (calc) Final    Comment:    Reference range: <100 . Desirable range <100 mg/dL for primary prevention;   <70 mg/dL for patients with CHD or diabetic patients  with > or = 2 CHD risk factors. [name] LDL-C is now calculated using the Martin-Hopkins  calculation, which is a validated novel method providing  better accuracy than the Friedewald equation in the  estimation of LDL-C.  Cresenciano Genre et al. Annamaria Helling. [id number]): 2061-2068   (http://education.[facility].com/faq/FAQ164)    LDL Cholesterol  Date Value Ref Range Status  08/25/2022 86 0 - 99 mg/dL Final    Comment:           Total Cholesterol/HDL:CHD Risk Coronary Heart Disease Risk Table                     Men   Women  1/2 Average Risk   3.4   3.3  Average Risk       5.0   4.4  2 X Average Risk   9.6   7.1  3 X Average Risk  23.4   11.0        Use the calculated Patient Ratio above and the CHD Risk Table to determine the patient's CHD Risk.        ATP III CLASSIFICATION (LDL):  <100     mg/dL   Optimal  100-129  mg/dL   Near or Above                    Optimal  130-159  mg/dL   Borderline  160-189  mg/dL   High  >190     mg/dL   Very High Performed at [facility], [place], [place] [address]    HDL  Date Value Ref Range Status  08/25/2022 47 >40 mg/dL Final  10/19/2015 48 >39 mg/dL Final   Triglycerides  Date Value Ref Range Status  08/25/2022 78 <150 mg/dL Final         Passed - Patient is not pregnant      Passed - Valid encounter within last 12 months    Recent Outpatient Visits           2 weeks ago History of CVA (cerebrovascular accident)   [facility] Bo Merino, FNP   2 months ago PVD (peripheral vascular disease) [facility])   [facility] Bo Merino, FNP   6 months ago OSA on CPAP   [facility] Bo Merino, FNP   6  months ago Primary hypertension   [facility] Bo Merino, FNP   7 months ago [facility] Bo Merino, [place]

## 2022-10-15 DIAGNOSIS — I2693 Single subsegmental pulmonary embolism without acute cor pulmonale: Secondary | ICD-10-CM | POA: Diagnosis not present

## 2022-10-15 DIAGNOSIS — I6389 Other cerebral infarction: Secondary | ICD-10-CM | POA: Diagnosis not present

## 2022-10-15 DIAGNOSIS — M5136 Other intervertebral disc degeneration, lumbar region: Secondary | ICD-10-CM | POA: Diagnosis not present

## 2022-10-15 DIAGNOSIS — K5792 Diverticulitis of intestine, part unspecified, without perforation or abscess without bleeding: Secondary | ICD-10-CM | POA: Diagnosis not present

## 2022-10-15 DIAGNOSIS — M25461 Effusion, right knee: Secondary | ICD-10-CM | POA: Diagnosis not present

## 2022-10-15 DIAGNOSIS — N1831 Chronic kidney disease, stage 3a: Secondary | ICD-10-CM | POA: Diagnosis not present

## 2022-10-15 DIAGNOSIS — I129 Hypertensive chronic kidney disease with stage 1 through stage 4 chronic kidney disease, or unspecified chronic kidney disease: Secondary | ICD-10-CM | POA: Diagnosis not present

## 2022-10-15 DIAGNOSIS — G4733 Obstructive sleep apnea (adult) (pediatric): Secondary | ICD-10-CM | POA: Diagnosis not present

## 2022-10-15 DIAGNOSIS — I69393 Ataxia following cerebral infarction: Secondary | ICD-10-CM | POA: Diagnosis not present

## 2022-10-15 DIAGNOSIS — D62 Acute posthemorrhagic anemia: Secondary | ICD-10-CM | POA: Diagnosis not present

## 2022-10-15 DIAGNOSIS — G5602 Carpal tunnel syndrome, left upper limb: Secondary | ICD-10-CM | POA: Diagnosis not present

## 2022-10-15 DIAGNOSIS — R918 Other nonspecific abnormal finding of lung field: Secondary | ICD-10-CM | POA: Diagnosis not present

## 2022-10-15 DIAGNOSIS — M170 Bilateral primary osteoarthritis of knee: Secondary | ICD-10-CM | POA: Diagnosis not present

## 2022-10-15 DIAGNOSIS — E785 Hyperlipidemia, unspecified: Secondary | ICD-10-CM | POA: Diagnosis not present

## 2022-10-15 DIAGNOSIS — K581 Irritable bowel syndrome with constipation: Secondary | ICD-10-CM | POA: Diagnosis not present

## 2022-10-15 DIAGNOSIS — I739 Peripheral vascular disease, unspecified: Secondary | ICD-10-CM | POA: Diagnosis not present

## 2022-10-15 DIAGNOSIS — N2889 Other specified disorders of kidney and ureter: Secondary | ICD-10-CM | POA: Diagnosis not present

## 2022-10-16 DIAGNOSIS — D62 Acute posthemorrhagic anemia: Secondary | ICD-10-CM | POA: Diagnosis not present

## 2022-10-16 DIAGNOSIS — I739 Peripheral vascular disease, unspecified: Secondary | ICD-10-CM | POA: Diagnosis not present

## 2022-10-16 DIAGNOSIS — M170 Bilateral primary osteoarthritis of knee: Secondary | ICD-10-CM | POA: Diagnosis not present

## 2022-10-16 DIAGNOSIS — R918 Other nonspecific abnormal finding of lung field: Secondary | ICD-10-CM | POA: Diagnosis not present

## 2022-10-16 DIAGNOSIS — K5792 Diverticulitis of intestine, part unspecified, without perforation or abscess without bleeding: Secondary | ICD-10-CM | POA: Diagnosis not present

## 2022-10-16 DIAGNOSIS — N1831 Chronic kidney disease, stage 3a: Secondary | ICD-10-CM | POA: Diagnosis not present

## 2022-10-16 DIAGNOSIS — I69393 Ataxia following cerebral infarction: Secondary | ICD-10-CM | POA: Diagnosis not present

## 2022-10-16 DIAGNOSIS — K581 Irritable bowel syndrome with constipation: Secondary | ICD-10-CM | POA: Diagnosis not present

## 2022-10-16 DIAGNOSIS — G4733 Obstructive sleep apnea (adult) (pediatric): Secondary | ICD-10-CM | POA: Diagnosis not present

## 2022-10-16 DIAGNOSIS — G5602 Carpal tunnel syndrome, left upper limb: Secondary | ICD-10-CM | POA: Diagnosis not present

## 2022-10-16 DIAGNOSIS — M25461 Effusion, right knee: Secondary | ICD-10-CM | POA: Diagnosis not present

## 2022-10-16 DIAGNOSIS — N2889 Other specified disorders of kidney and ureter: Secondary | ICD-10-CM | POA: Diagnosis not present

## 2022-10-16 DIAGNOSIS — E785 Hyperlipidemia, unspecified: Secondary | ICD-10-CM | POA: Diagnosis not present

## 2022-10-16 DIAGNOSIS — M5136 Other intervertebral disc degeneration, lumbar region: Secondary | ICD-10-CM | POA: Diagnosis not present

## 2022-10-16 DIAGNOSIS — I2693 Single subsegmental pulmonary embolism without acute cor pulmonale: Secondary | ICD-10-CM | POA: Diagnosis not present

## 2022-10-16 DIAGNOSIS — I129 Hypertensive chronic kidney disease with stage 1 through stage 4 chronic kidney disease, or unspecified chronic kidney disease: Secondary | ICD-10-CM | POA: Diagnosis not present

## 2022-10-20 DIAGNOSIS — I6529 Occlusion and stenosis of unspecified carotid artery: Secondary | ICD-10-CM | POA: Insufficient documentation

## 2022-10-20 NOTE — Progress Notes (Unsigned)
MRN : [id number]  Jared Cline is a 77 y.o. (1945/09/27) male who presents with chief complaint of check carotid arteries.  History of Present Illness:   The patient is seen for evaluation of carotid stenosis. The carotid stenosis was identified after CTA dated 09/15/2022.  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   Greater than 90% stneosis of the Right ICA origin due to bulky calcified plaque.  The patient denies amaurosis fugax. There is no recent history of TIA symptoms or focal motor deficits. There is no prior documented CVA.  There is no history of migraine headaches. There is no history of seizures.  The patient is taking enteric-coated aspirin 81 mg daily.  No recent shortening of the patient's walking distance or new symptoms consistent with claudication.  No history of rest pain symptoms. No new ulcers or wounds of the lower extremities have occurred.  There is no history of DVT, PE or superficial thrombophlebitis. No recent episodes of angina or shortness of breath documented.   No outpatient medications have been marked as taking for the 10/21/22 encounter (Appointment) with Delana Meyer, Dolores Lory, MD.    Past Medical History:  Diagnosis Date   BPH (benign prostatic hypertrophy)    Bradycardia    evaluated by Dr. Nehemiah Massed   Cancer [facility])    prostate   Carpal tunnel syndrome, left    left arm   CKD (chronic kidney disease) stage 2, GFR 60-89 ml/min    Diverticulitis    Gastritis 2017   History of prostate cancer    HNP (herniated nucleus pulposus), lumbar    Hyperlipidemia    Hypertension    IBS (irritable bowel syndrome)    Impotence    Multiple lung nodules    nonspecific, largest 63m in Jan 2016; scanned March 2017; rescan 3-6 months later   OA  (osteoarthritis) of knee    right   Renal mass    evaluated; followed by urologist   Sleep apnea    on CPAP   Torn meniscus    x 2; right knee    Past Surgical History:  Procedure Laterality Date   abdominal ultrasound  Oct 2015   Negative for eval for AAA   COLONOSCOPY     KNEE ARTHROSCOPY Right    x 2   LUMBAR LAMINECTOMY     PROSTATECTOMY     robotic    Social History Social History   Tobacco Use   Smoking status: Former    Packs/day: 0.50    Years: 9.00    Total pack years: 4.50    Types: Cigarettes    Quit date: 08/20/1971    Years since quitting: 51.2    Passive exposure: Past   Smokeless tobacco: Never  Vaping Use   Vaping Use: Never used  Substance Use Topics   Alcohol use: No    Alcohol/week: 0.0 standard drinks of alcohol   Drug use: No    Family History Family History  Problem Relation Age of Onset   Stroke Mother    Hypertension Mother    Aneurysm Father        cerebral   Hypertension Father    Heart disease Paternal Uncle    Alcohol abuse Brother    Cancer Neg Hx    COPD Neg Hx  Diabetes Neg Hx     Allergies  Allergen Reactions   Flomax [Tamsulosin Hcl] Shortness Of Breath   Ace Inhibitors Other (See Comments)    Angioedema   Amlodipine Other (See Comments)    Angioedema   Doxazosin    Lipitor [Atorvastatin] Other (See Comments)    Memory loss     REVIEW OF SYSTEMS (Negative unless checked)  Constitutional: '[]'$ Weight loss  '[]'$ Fever  '[]'$ Chills Cardiac: '[]'$ Chest pain   '[]'$ Chest pressure   '[]'$ Palpitations   '[]'$ Shortness of breath when laying flat   '[]'$ Shortness of breath with exertion. Vascular:  '[x]'$ Pain in legs with walking   '[]'$ Pain in legs at rest  '[]'$ History of DVT   '[]'$ Phlebitis   '[]'$ Swelling in legs   '[]'$ Varicose veins   '[]'$ Non-healing ulcers Pulmonary:   '[]'$ Uses home oxygen   '[]'$ Productive cough   '[]'$ Hemoptysis   '[]'$ Wheeze  '[]'$ COPD   '[]'$ Asthma Neurologic:  '[]'$ Dizziness   '[]'$ Seizures   '[]'$ History of stroke   '[]'$ History of TIA  '[]'$ Aphasia   '[]'$ Vissual  changes   '[]'$ Weakness or numbness in arm   '[]'$ Weakness or numbness in leg Musculoskeletal:   '[]'$ Joint swelling   '[]'$ Joint pain   '[]'$ Low back pain Hematologic:  '[]'$ Easy bruising  '[]'$ Easy bleeding   '[]'$ Hypercoagulable state   '[]'$ Anemic Gastrointestinal:  '[]'$ Diarrhea   '[]'$ Vomiting  '[]'$ Gastroesophageal reflux/heartburn   '[]'$ Difficulty swallowing. Genitourinary:  '[]'$ Chronic kidney disease   '[]'$ Difficult urination  '[]'$ Frequent urination   '[]'$ Blood in urine Skin:  '[]'$ Rashes   '[]'$ Ulcers  Psychological:  '[]'$ History of anxiety   '[]'$  History of major depression.  Physical Examination  There were no vitals filed for this visit. There is no height or weight on file to calculate BMI. Gen: WD/WN, NAD Head: [place]/AT, No temporalis wasting.  Ear/Nose/Throat: Hearing grossly intact, nares w/o erythema or drainage Eyes: PER, EOMI, sclera nonicteric.  Neck: Supple, no masses.  No bruit or JVD.  Pulmonary:  Good air movement, no audible wheezing, no use of accessory muscles.  Cardiac: RRR, normal S1, S2, no Murmurs. Vascular:  carotid bruit noted Vessel Right Left  Radial Palpable Palpable  Carotid  Palpable  Palpable  Subclav  Palpable Palpable  Gastrointestinal: soft, non-distended. No guarding/no peritoneal signs.  Musculoskeletal: M/S 5/5 throughout.  No visible deformity.  Neurologic: CN 2-12 intact. Pain and light touch intact in extremities.  Symmetrical.  Speech is fluent. Motor exam as listed above. Psychiatric: Judgment intact, Mood & affect appropriate for pt's clinical situation. Dermatologic: No rashes or ulcers noted.  No changes consistent with cellulitis.   CBC Lab Results  Component Value Date   WBC 4.8 09/15/2022   HGB 11.9 (L) 09/15/2022   HCT 36.7 (L) 09/15/2022   MCV 84.2 09/15/2022   PLT 320 09/15/2022    BMET    Component Value Date/Time   NA 132 (L) 09/15/2022 1006   NA 135 10/19/2015 1048   NA 133 (L) 08/25/2014 1020   K 3.7 09/15/2022 1006   K 4.1 08/25/2014 1020   CL 105 09/15/2022  1006   CL 100 08/25/2014 1020   CO2 24 09/15/2022 1006   CO2 27 08/25/2014 1020   GLUCOSE 97 09/15/2022 1006   GLUCOSE 95 08/25/2014 1020   BUN 12 09/15/2022 1006   BUN 12 10/19/2015 1048   BUN 13 08/25/2014 1020   CREATININE 1.16 09/15/2022 1006   CREATININE 1.55 (H) 02/20/2022 1439   CALCIUM 9.3 09/15/2022 1006   CALCIUM 9.1 08/25/2014 1020   GFRNONAA >60 09/15/2022 1006   GFRNONAA 49 (  L) 07/21/2020 0930   GFRAA 57 (L) 07/21/2020 0930   CrCl cannot be calculated (Patient's most recent lab result is older than the maximum 21 days allowed.).  COAG No results found for: "INR", "PROTIME"  Radiology No results found.   Assessment/Plan There are no diagnoses linked to this encounter.   Hortencia Pilar, MD  10/20/2022 8:57 PM

## 2022-10-21 ENCOUNTER — Ambulatory Visit: Payer: Self-pay | Admitting: *Deleted

## 2022-10-21 ENCOUNTER — Ambulatory Visit (INDEPENDENT_AMBULATORY_CARE_PROVIDER_SITE_OTHER): Payer: Medicare Other | Admitting: Vascular Surgery

## 2022-10-21 ENCOUNTER — Encounter (INDEPENDENT_AMBULATORY_CARE_PROVIDER_SITE_OTHER): Payer: Self-pay | Admitting: Vascular Surgery

## 2022-10-21 VITALS — BP 179/72 | HR 72 | Resp 18 | Ht 72.0 in | Wt 224.8 lb

## 2022-10-21 DIAGNOSIS — I700 Atherosclerosis of aorta: Secondary | ICD-10-CM | POA: Diagnosis not present

## 2022-10-21 DIAGNOSIS — I6529 Occlusion and stenosis of unspecified carotid artery: Secondary | ICD-10-CM

## 2022-10-21 DIAGNOSIS — D62 Acute posthemorrhagic anemia: Secondary | ICD-10-CM | POA: Diagnosis not present

## 2022-10-21 DIAGNOSIS — I10 Essential (primary) hypertension: Secondary | ICD-10-CM | POA: Diagnosis not present

## 2022-10-21 DIAGNOSIS — I739 Peripheral vascular disease, unspecified: Secondary | ICD-10-CM | POA: Diagnosis not present

## 2022-10-21 DIAGNOSIS — I639 Cerebral infarction, unspecified: Secondary | ICD-10-CM

## 2022-10-21 DIAGNOSIS — N1831 Chronic kidney disease, stage 3a: Secondary | ICD-10-CM | POA: Diagnosis not present

## 2022-10-21 DIAGNOSIS — K581 Irritable bowel syndrome with constipation: Secondary | ICD-10-CM | POA: Diagnosis not present

## 2022-10-21 DIAGNOSIS — I2693 Single subsegmental pulmonary embolism without acute cor pulmonale: Secondary | ICD-10-CM | POA: Diagnosis not present

## 2022-10-21 DIAGNOSIS — M5136 Other intervertebral disc degeneration, lumbar region: Secondary | ICD-10-CM | POA: Diagnosis not present

## 2022-10-21 DIAGNOSIS — E785 Hyperlipidemia, unspecified: Secondary | ICD-10-CM | POA: Diagnosis not present

## 2022-10-21 DIAGNOSIS — K5792 Diverticulitis of intestine, part unspecified, without perforation or abscess without bleeding: Secondary | ICD-10-CM | POA: Diagnosis not present

## 2022-10-21 DIAGNOSIS — I25119 Atherosclerotic heart disease of native coronary artery with unspecified angina pectoris: Secondary | ICD-10-CM

## 2022-10-21 DIAGNOSIS — I69393 Ataxia following cerebral infarction: Secondary | ICD-10-CM | POA: Diagnosis not present

## 2022-10-21 DIAGNOSIS — N2889 Other specified disorders of kidney and ureter: Secondary | ICD-10-CM | POA: Diagnosis not present

## 2022-10-21 DIAGNOSIS — E782 Mixed hyperlipidemia: Secondary | ICD-10-CM

## 2022-10-21 DIAGNOSIS — G5602 Carpal tunnel syndrome, left upper limb: Secondary | ICD-10-CM | POA: Diagnosis not present

## 2022-10-21 DIAGNOSIS — I63231 Cerebral infarction due to unspecified occlusion or stenosis of right carotid arteries: Secondary | ICD-10-CM

## 2022-10-21 DIAGNOSIS — I2510 Atherosclerotic heart disease of native coronary artery without angina pectoris: Secondary | ICD-10-CM

## 2022-10-21 DIAGNOSIS — M25461 Effusion, right knee: Secondary | ICD-10-CM | POA: Diagnosis not present

## 2022-10-21 DIAGNOSIS — I129 Hypertensive chronic kidney disease with stage 1 through stage 4 chronic kidney disease, or unspecified chronic kidney disease: Secondary | ICD-10-CM | POA: Diagnosis not present

## 2022-10-21 DIAGNOSIS — M170 Bilateral primary osteoarthritis of knee: Secondary | ICD-10-CM | POA: Diagnosis not present

## 2022-10-21 DIAGNOSIS — R918 Other nonspecific abnormal finding of lung field: Secondary | ICD-10-CM | POA: Diagnosis not present

## 2022-10-21 DIAGNOSIS — G4733 Obstructive sleep apnea (adult) (pediatric): Secondary | ICD-10-CM | POA: Diagnosis not present

## 2022-10-21 MED ORDER — CLOPIDOGREL BISULFATE 75 MG PO TABS: 75.0000 mg | ORAL_TABLET | Freq: Every day | ORAL | 11 refills | Status: DC

## 2022-10-21 NOTE — Telephone Encounter (Signed)
Patient informed to go to ER. He stated he had seen his vein and vascular doctor today and the doctor informed him and showed his the blockages he had and that he needed surgery soon. Patient stated that he will go to ER if no better

## 2022-10-21 NOTE — Telephone Encounter (Signed)
  Chief Complaint: fell 10/13/22 per [place] with [place].  Symptoms: dizziness getting out of truck and fell on right elbow. Reports felt lightheaded. Blurred vision  since s/p stroke. Right elbow soreness 8 out of 10 pain. Denies hitting head. Dizziness comes and goes. Frequency: na  Pertinent Negatives: Patient denies chest pain no difficulty breathing no fever reported. No dizziness or room spinning . Disposition: '[]'$ ED /'[x]'$ Urgent Care (no appt availability in office) / '[]'$ Appointment(In office/virtual)/ '[]'$  [place] [facility]/ '[]'$ Home Care/ '[]'$ Refused Recommended Disposition /'[]'$ [place] [facility]/ '[x]'$  Follow-up with PCP Additional Notes:   Patient unable to come in today for OV due to transportation issues. Patient reports he will not be able to come in tomorrow due to another appt. Recommended patient go to UC to evaluate elbow. Recommended if dizziness continues or worsens go to ED . Please advise.     Reason for Disposition  [1] MODERATE dizziness (e.g., interferes with normal activities) AND [2] has been evaluated by doctor (or NP/PA) for this  Answer Assessment - Initial Assessment Questions 1. DESCRIPTION: "Describe your dizziness."     Lightheaded 10/13/22 fell getting out of truck and fell on right elbow 2. LIGHTHEADED: "Do you feel lightheaded?" (e.g., somewhat faint, woozy, weak upon standing)     Lightheaded and dizzy hx stroke  3. VERTIGO: "Do you feel like either you or the room is spinning or tilting?" (i.e. vertigo)     na 4. SEVERITY: "How bad is it?"  "Do you feel like you are going to faint?" "Can you stand and walk?"   - MILD: Feels slightly dizzy, but walking normally.   - MODERATE: Feels unsteady when walking, but not falling; interferes with normal activities (e.g., school, work).   - SEVERE: Unable to walk without falling, or requires assistance to walk without falling; feels like passing out now.      Dizziness comes and goes 5. ONSET:  "When did the  dizziness begin?"     After stroke has been seen by MD  6. AGGRAVATING FACTORS: "Does anything make it worse?" (e.g., standing, change in head position)     Standing feels dizzy 7. HEART RATE: "Can you tell me your heart rate?" "How many beats in 15 seconds?"  (Note: not all patients can do this)       na 8. CAUSE: "What do you think is causing the dizziness?"     Not sure side effect of stroke  9. RECURRENT SYMPTOM: "Have you had dizziness before?" If Yes, ask: "When was the last time?" "What happened that time?"     Yes  10. OTHER SYMPTOMS: "Do you have any other symptoms?" (e.g., fever, chest pain, vomiting, diarrhea, bleeding)       Dizziness comes and goes. Blurred vision continues since stroke  12. PREGNANCY: "Is there any chance you are pregnant?" "When was your last menstrual period?"       na  Protocols used: Dizziness - Lightheadedness-A-AH

## 2022-10-21 NOTE — Telephone Encounter (Signed)
FYI

## 2022-10-22 ENCOUNTER — Encounter: Payer: Self-pay | Admitting: Physical Medicine & Rehabilitation

## 2022-10-22 ENCOUNTER — Encounter: Payer: Medicare Other | Attending: Physical Medicine & Rehabilitation | Admitting: Physical Medicine & Rehabilitation

## 2022-10-22 ENCOUNTER — Other Ambulatory Visit: Payer: Self-pay | Admitting: Nurse Practitioner

## 2022-10-22 VITALS — BP 159/68 | HR 82 | Ht 72.0 in | Wt 222.0 lb

## 2022-10-22 DIAGNOSIS — K581 Irritable bowel syndrome with constipation: Secondary | ICD-10-CM | POA: Diagnosis not present

## 2022-10-22 DIAGNOSIS — I69398 Other sequelae of cerebral infarction: Secondary | ICD-10-CM | POA: Diagnosis not present

## 2022-10-22 DIAGNOSIS — I10 Essential (primary) hypertension: Secondary | ICD-10-CM

## 2022-10-22 DIAGNOSIS — D62 Acute posthemorrhagic anemia: Secondary | ICD-10-CM | POA: Diagnosis not present

## 2022-10-22 DIAGNOSIS — E785 Hyperlipidemia, unspecified: Secondary | ICD-10-CM | POA: Diagnosis not present

## 2022-10-22 DIAGNOSIS — M170 Bilateral primary osteoarthritis of knee: Secondary | ICD-10-CM | POA: Diagnosis not present

## 2022-10-22 DIAGNOSIS — M25461 Effusion, right knee: Secondary | ICD-10-CM | POA: Diagnosis not present

## 2022-10-22 DIAGNOSIS — K5792 Diverticulitis of intestine, part unspecified, without perforation or abscess without bleeding: Secondary | ICD-10-CM | POA: Diagnosis not present

## 2022-10-22 DIAGNOSIS — N1831 Chronic kidney disease, stage 3a: Secondary | ICD-10-CM | POA: Diagnosis not present

## 2022-10-22 DIAGNOSIS — G4733 Obstructive sleep apnea (adult) (pediatric): Secondary | ICD-10-CM | POA: Diagnosis not present

## 2022-10-22 DIAGNOSIS — N2889 Other specified disorders of kidney and ureter: Secondary | ICD-10-CM | POA: Diagnosis not present

## 2022-10-22 DIAGNOSIS — I739 Peripheral vascular disease, unspecified: Secondary | ICD-10-CM | POA: Diagnosis not present

## 2022-10-22 DIAGNOSIS — I639 Cerebral infarction, unspecified: Secondary | ICD-10-CM | POA: Diagnosis not present

## 2022-10-22 DIAGNOSIS — I69393 Ataxia following cerebral infarction: Secondary | ICD-10-CM | POA: Diagnosis not present

## 2022-10-22 DIAGNOSIS — I129 Hypertensive chronic kidney disease with stage 1 through stage 4 chronic kidney disease, or unspecified chronic kidney disease: Secondary | ICD-10-CM | POA: Diagnosis not present

## 2022-10-22 DIAGNOSIS — R269 Unspecified abnormalities of gait and mobility: Secondary | ICD-10-CM | POA: Diagnosis not present

## 2022-10-22 DIAGNOSIS — G5602 Carpal tunnel syndrome, left upper limb: Secondary | ICD-10-CM | POA: Diagnosis not present

## 2022-10-22 DIAGNOSIS — I2693 Single subsegmental pulmonary embolism without acute cor pulmonale: Secondary | ICD-10-CM | POA: Diagnosis not present

## 2022-10-22 DIAGNOSIS — R918 Other nonspecific abnormal finding of lung field: Secondary | ICD-10-CM | POA: Diagnosis not present

## 2022-10-22 DIAGNOSIS — M5136 Other intervertebral disc degeneration, lumbar region: Secondary | ICD-10-CM | POA: Diagnosis not present

## 2022-10-22 NOTE — Telephone Encounter (Signed)
Requested medication (s) are due for refill today: yes  Requested medication (s) are on the active medication list: yes  Last refill:  09/09/22 #30 tab  Future visit scheduled: no  Notes to clinic:  last ordered at hospital discharge   Requested Prescriptions  Pending Prescriptions Disp Refills   spironolactone (ALDACTONE) 25 MG tablet [Pharmacy Med Name: SPIRONOLACTONE '25MG'$  TABLETS] 90 tablet     Sig: TAKE 1 TABLET(25 MG) BY MOUTH DAILY     Cardiovascular: Diuretics - Aldosterone Antagonist Failed - 10/22/2022  6:22 AM      Failed - Na in normal range and within 180 days    Sodium  Date Value Ref Range Status  09/15/2022 132 (L) 135 - 145 mmol/L Final    Comment:    ELECTROLYTES REPEATED TO VERIFY. QSD  10/19/2015 135 134 - 144 mmol/L Final  08/25/2014 133 (L) 136 - 145 mmol/L Final         Failed - Last BP in normal range    BP Readings from Last 1 Encounters:  10/22/22 (!) 159/68         Passed - Cr in normal range and within 180 days    Creat  Date Value Ref Range Status  02/20/2022 1.55 (H) 0.70 - 1.28 mg/dL Final   Creatinine, Ser  Date Value Ref Range Status  09/15/2022 1.16 0.61 - 1.24 mg/dL Final   Creatinine, Urine  Date Value Ref Range Status  05/02/2021 128 20 - 320 mg/dL Final         Passed - K in normal range and within 180 days    Potassium  Date Value Ref Range Status  09/15/2022 3.7 3.5 - 5.1 mmol/L Final  08/25/2014 4.1 3.5 - 5.1 mmol/L Final         Passed - eGFR is 30 or above and within 180 days    GFR, Est African American  Date Value Ref Range Status  07/21/2020 57 (L) > OR = 60 mL/min/1.43m Final   GFR, Est Non African American  Date Value Ref Range Status  07/21/2020 49 (L) > OR = 60 mL/min/1.742mFinal   GFR, Estimated  Date Value Ref Range Status  09/15/2022 >60 >60 mL/min Final    Comment:    (NOTE) Calculated using the CKD-EPI Creatinine Equation (2021)    eGFR  Date Value Ref Range Status  02/20/2022 46 (L) > OR =  60 mL/min/1.7326minal    Comment:    The eGFR is based on the CKD-EPI 2021 equation. To calculate  the new eGFR from a previous Creatinine or Cystatin C result, go to https://www.kidney.org/professionals/ kdoqi/gfr%5Fcalculator          Passed - Valid encounter within last 6 months    Recent Outpatient Visits           4 weeks ago [facility] of [facility]bro[facility]   [facility]   6 months ago [facility]SA [facility] CP[facility]   [facility]   7 months [facility]   [facility]   8 months ago Dizziness   [facility]           Refused Prescriptions Disp Refills   cloNIDine (CATAPRES)  0.2 MG tablet [Pharmacy Med Name: CLONIDINE 0.'2MG'$  TABLETS] 180 tablet     Sig: TAKE 1 TABLET(0.2 MG) BY MOUTH TWICE DAILY     Cardiovascular:  Alpha-2 Agonists Failed - 10/22/2022  6:22 AM      Failed - Last BP in normal range    BP Readings from Last 1 Encounters:  10/22/22 (!) 159/68         Passed - Last Heart Rate in normal range    Pulse Readings from Last 1 Encounters:  10/22/22 82         Passed - Valid encounter within last 6 months    Recent Outpatient Visits           4 weeks ago History of CVA (cerebrovascular accident)   [facility] Bo Merino, FNP   3 months ago PVD (peripheral vascular disease) [facility])   [facility] Bo Merino, FNP   6 months ago OSA on CPAP   [facility] Bo Merino, FNP   7 months ago Primary hypertension   [facility] Bo Merino, FNP   8 months ago Dizziness   [facility] Serafina Royals F, FNP                hydrochlorothiazide (HYDRODIURIL) 12.5 MG tablet [Pharmacy Med Name: HYDROCHLOROTHIAZIDE 12.'5MG'$  TABLETS] 90 tablet     Sig: TAKE 1 TABLET(12.5 MG) BY MOUTH DAILY     Cardiovascular: Diuretics - Thiazide Failed - 10/22/2022  6:22 AM      Failed - Na in normal range and within 180 days    Sodium  Date Value Ref Range Status  09/15/2022 132 (L) 135 - 145 mmol/L Final    Comment:    ELECTROLYTES REPEATED TO VERIFY. QSD  10/19/2015 135 134 - 144 mmol/L Final  08/25/2014 133 (L) 136 - 145 mmol/L Final         Failed - Last BP in normal range    BP Readings from Last 1 Encounters:  10/22/22 (!) 159/68         Passed - Cr in normal range and within 180 days    Creat  Date Value Ref Range Status  02/20/2022 1.55 (H) 0.70 - 1.28 mg/dL Final   Creatinine, Ser  Date Value Ref Range Status  09/15/2022 1.16 0.61 - 1.24 mg/dL Final   Creatinine, Urine  Date Value Ref Range Status  05/02/2021 128 20 - 320 mg/dL Final         Passed - K in normal range and within 180 days    Potassium  Date Value Ref Range Status  09/15/2022 3.7 3.5 - 5.1 mmol/L Final  08/25/2014 4.1 3.5 - 5.1 mmol/L Final         Passed - Valid encounter within last 6 months    Recent Outpatient Visits           4 weeks ago History of CVA (cerebrovascular accident)   [facility] Bo Merino, FNP   3 months ago PVD (peripheral vascular disease) [facility])   [facility] Bo Merino, FNP   6 months ago OSA on CPAP   [place], FNP   7 months ago Primary hypertension   [facility] Bo Merino, FNP   8 months ago [facility] Bo [facility]ino, FNP

## 2022-10-22 NOTE — Telephone Encounter (Signed)
Requested Prescriptions  Pending Prescriptions Disp Refills   spironolactone (ALDACTONE) 25 MG tablet [Pharmacy Med Name: SPIRONOLACTONE '25MG'$  TABLETS] 90 tablet     Sig: TAKE 1 TABLET(25 MG) BY MOUTH DAILY     Cardiovascular: Diuretics - Aldosterone Antagonist Failed - 10/22/2022  6:22 AM      Failed - Na in normal range and within 180 days    Sodium  Date Value Ref Range Status  09/15/2022 132 (L) 135 - 145 mmol/L Final    Comment:    ELECTROLYTES REPEATED TO VERIFY. QSD  10/19/2015 135 134 - 144 mmol/L Final  08/25/2014 133 (L) 136 - 145 mmol/L Final         Failed - Last BP in normal range    BP Readings from Last 1 Encounters:  10/22/22 (!) 159/68         Passed - Cr in normal range and within 180 days    Creat  Date Value Ref Range Status  02/20/2022 1.55 (H) 0.70 - 1.28 mg/dL Final   Creatinine, Ser  Date Value Ref Range Status  09/15/2022 1.16 0.61 - 1.24 mg/dL Final   Creatinine, Urine  Date Value Ref Range Status  05/02/2021 128 20 - 320 mg/dL Final         Passed - K in normal range and within 180 days    Potassium  Date Value Ref Range Status  09/15/2022 3.7 3.5 - 5.1 mmol/L Final  08/25/2014 4.1 3.5 - 5.1 mmol/L Final         Passed - eGFR is 30 or above and within 180 days    GFR, Est African American  Date Value Ref Range Status  07/21/2020 57 (L) > OR = 60 mL/min/1.63m Final   GFR, Est Non African American  Date Value Ref Range Status  07/21/2020 49 (L) > OR = 60 mL/min/1.738mFinal   GFR, Estimated  Date Value Ref Range Status  09/15/2022 >60 >60 mL/min Final    Comment:    (NOTE) Calculated using the CKD-EPI Creatinine Equation (2021)    eGFR  Date Value Ref Range Status  02/20/2022 46 (L) > OR = 60 mL/min/1.7332minal    Comment:    The eGFR is based on the CKD-EPI 2021 equation. To calculate  the new eGFR from a previous Creatinine or Cystatin C result, go to https://www.kidney.org/professionals/ kdoqi/gfr%5Fcalculator           Passed - Valid encounter within last 6 months    Recent Outpatient Visits           4 weeks ago History of CVA (cerebrovascular accident)   [facility]   3 [facility]   6 months ago [facility] CP[facility]   [facility]   7 months ago [facility] hypertension   [facility]   8 months ago Dizziness   [facility] [place]           Refused Prescriptions Disp Refills   cloNIDine (CATAPRES) 0.2 MG tablet [Pharmacy Med Name: CLONIDINE 0.'2MG'$  TABLETS] 180 tablet     Sig: TAKE 1 TABLET(0.2 MG) BY MOUTH TWICE DAILY     Cardiovascular:  Alpha-2 Agonists Failed - 10/22/2022  6:22 AM      Failed -  Last BP in normal range    BP Readings from Last 1 Encounters:  10/22/22 (!) 159/68         Passed - Last Heart Rate in normal range    Pulse Readings from Last 1 Encounters:  10/22/22 82         Passed - Valid encounter within last 6 months    Recent Outpatient Visits           4 weeks ago History of CVA (cerebrovascular accident)   [facility] Bo Merino, FNP   3 months ago PVD (peripheral vascular disease) [facility])   [facility] Bo Merino, FNP   6 months ago OSA on CPAP   [facility] Bo Merino, FNP   7 months ago Primary hypertension   [facility] Bo [facility]ino, FNP   8 months ago Dizziness   [facility] Serafina Royals F, FNP               hydrochlorothiazide (HYDRODIURIL) 12.5 MG tablet [Pharmacy Med Name: HYDROCHLOROTHIAZIDE 12.'5MG'$  TABLETS] 90 tablet     Sig: TAKE 1 TABLET(12.5 MG) BY MOUTH DAILY     Cardiovascular: Diuretics - Thiazide Failed - 10/22/2022   6:22 AM      Failed - Na in normal range and within 180 days    Sodium  Date Value Ref Range Status  09/15/2022 132 (L) 135 - 145 mmol/L Final    Comment:    ELECTROLYTES REPEATED TO VERIFY. QSD  10/19/2015 135 134 - 144 mmol/L Final  08/25/2014 133 (L) 136 - 145 mmol/L Final         Failed - Last BP in normal range    BP Readings from Last 1 Encounters:  10/22/22 (!) 159/68         Passed - Cr in normal range and within 180 days    Creat  Date Value Ref Range Status  02/20/2022 1.55 (H) 0.70 - 1.28 mg/dL Final   Creatinine, Ser  Date Value Ref Range Status  09/15/2022 1.16 0.61 - 1.24 mg/dL Final   Creatinine, Urine  Date Value Ref Range Status  05/02/2021 128 20 - 320 mg/dL Final         Passed - K in normal range and within 180 days    Potassium  Date Value Ref Range Status  09/15/2022 3.7 3.5 - 5.1 mmol/L Final  08/25/2014 4.1 3.5 - 5.1 mmol/L Final         Passed - Valid encounter within last 6 months    Recent Outpatient Visits           4 weeks ago History of CVA (cerebrovascular accident)   [facility] Bo Merino, FNP   3 months ago [facility]   [facility] Bo Merino, FNP   6 months ago OSA on CPAP   [place], FNP   7 months ago Primary hypertension   [facility] Bo Merino, FNP   8 months ago Dizziness   [facility] Bo Merino, [place]

## 2022-10-22 NOTE — Progress Notes (Signed)
Subjective:    Patient ID: Jared Cline, male    DOB: 1945-12-02, 77 y.o.   MRN: [id number]   77 y.o. male with history of BPH, CKD, severe OA bilateral knees who was recently admitted to [facility] on 08/24/2022 with falls x 2, dizziness, weakness and inability to stand.  He was found to have segmental PE in RLL and LLL.  MRI brain revealed acute punctate infarct posterior inferior pons and right cerebellar peduncle.  MRA revealed extensive intracranial atherosclerosis with advanced mid basilar and bilateral PCA branch stenosis, occluded left VA and moderate right-M1 segment stenosis as well as 50% narrowing proximal left-ICA.  He was started on treatment dose Lovenox and transition to [place].  Dr. Quinn Axe recommended transition to aspirin once DOAC continued and to follow-up with neurology after discharge.  PT/ OT has been working with patient and patient was limited by right knee pain, balance deficits, dizziness as well as right ataxia.  CIR was recommended due to functional decline   Admit date: 08/29/2022 Discharge date: 09/10/2022 HPI 77 year old male with history as above is here for physical medicine and rehabilitation follow-up.  He has no complaints.  He does have chronic knee pain but no worsening.  A couple weeks ago patient fell while getting out of his car after going out to dinner with his wife.  His wife was driving.  He has not driven since his stroke Fell in the grass after he tripped on a curb  in the dark, needed assist from neighbors to get off the wet ground .  Able to ambulate up 5 steps to get into house , able to stand for 20 min at a time  Still feels unsteady with gait .   HHPT and OT continue   Seen by [facility] surgeon  plan is for R ICA stent for 90% stenosis  Seen by cardiology 10/02/22, started holter monitor to r/o AFib  CLINICAL DATA:  MR head 08/24/2022   EXAM: MRI HEAD WITHOUT CONTRAST   TECHNIQUE: Multiplanar, multiecho pulse sequences of the brain and  surrounding structures were obtained without intravenous contrast.   COMPARISON:  MR head without contrast 08/24/2022   FINDINGS: Brain: The diffusion-weighted images demonstrate no acute or subacute infarct. Focal diffusion signal along the posterior aspect of the right pons and cerebellar peduncle is less intense on today's study. No associated T2 or FLAIR signal abnormality is present.   Mild diffuse periventricular and scattered subcortical T2 hyperintensities are otherwise stable. Mild generalized atrophy is stable. No other acute abnormality is present. The ventricles are of normal size. No significant extraaxial fluid collection is present.   The brainstem and cerebellum are otherwise within normal limits. Midline structures are within normal limits.   Vascular: Flow is present in the major intracranial arteries.   Skull and upper cervical spine: The craniocervical junction is normal. Upper cervical spine is within normal limits. Marrow signal is unremarkable.   Sinuses/Orbits: The paranasal sinuses and mastoid air cells are clear. The globes and orbits are within normal limits.   IMPRESSION: 1. No acute intracranial abnormality or significant interval change. 2. Previously noted right cerebellar peduncle punctate infarct is only faintly seen on today's study without significant T2 or FLAIR changes. 3. Mild diffuse periventricular and scattered subcortical T2 hyperintensities are otherwise stable. This likely reflects the sequela of chronic microvascular ischemia.     Electronically Signed   By: San Morelle M.D.   On: 09/15/2022 11:44 Independent review of the above study, tiny infarcts  brainstem and pontine difficult to visualize Pain Inventory Average Pain 5 Pain Right Now 3 My pain is tingling and aching  LOCATION OF PAIN  Left foot pain, more of a tingling feeling and left hand tingling   BOWEL Number of stools per week: 2  Type of laxative  Sennakot   BLADDER Normal    Mobility walk without assistance walk with assistance use a cane use a walker how many minutes can you walk? 5-10  ability to climb steps?  yes do you drive?  no transfers alone Do you have any goals in this area?  yes  Function retired  Neuro/Psych tingling trouble walking dizziness  Prior Studies Any changes since last visit?  no  Physicians involved in your care Any changes since last visit?  no   Family History  Problem Relation Age of Onset   Stroke Mother    Hypertension Mother    Aneurysm Father        cerebral   Hypertension Father    Heart disease Paternal Uncle    Alcohol abuse Brother    Cancer Neg Hx    COPD Neg Hx    Diabetes Neg Hx    Social History   Socioeconomic History   Marital status: Married    Spouse name: Arhum Cayabyab   Number of children: 3   Years of education: Not on file   Highest education level: Not on file  Occupational History   Occupation: retired [employer] [place] Use   Smoking status: Former    Packs/day: 0.50    Years: 9.00    Total pack years: 4.50    Types: Cigarettes    Quit date: 08/20/1971    Years since quitting: 51.2    Passive exposure: Past   Smokeless tobacco: Never  Vaping Use   Vaping Use: Never used  Substance and Sexual Activity   Alcohol use: No    Alcohol/week: 0.0 standard drinks of alcohol   Drug use: No   Sexual activity: Not Currently  Other Topics Concern   Not on file  Social History Narrative   Retired from [employer] 2012.   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    Social Determinants of Health   Financial Resource Strain: Low Risk  (07/09/2022)   Overall Financial Resource Strain (CARDIA)    Difficulty of Paying Living Expenses: Not hard at all  Food Insecurity: Food Insecurity Present (09/30/2022)   Hunger Vital Sign    Worried About Running Out of Food in the Last Year: Never true    Ran Out of Food in the Last  Year: Sometimes true  Transportation Needs: No Transportation Needs (09/30/2022)   PRAPARE - Hydrologist (Medical): No    Lack of Transportation (Non-Medical): No  Physical Activity: Inactive (07/05/2021)   Exercise Vital Sign    Days of Exercise per Week: 0 days    Minutes of Exercise per Session: 0 min  Stress: No Stress Concern Present (07/09/2022)   [place]    Feeling of Stress : Not at all  Social Connections: [place] (07/09/2022)   Social Connection and Isolation Panel [NHANES]    Frequency of Communication with Friends and Family: Three times a week    Frequency of Social Gatherings with Friends and Family: Once a week    Attends Religious Services: More than 4 times per year  Active Member of Clubs or Organizations: Not on file    Attends Club or Organization Meetings: More than 4 times per year    Marital Status: Married   Past Surgical History:  Procedure Laterality Date   abdominal ultrasound  Oct 2015   Negative for eval for AAA   COLONOSCOPY     KNEE ARTHROSCOPY Right    x 2   LUMBAR LAMINECTOMY     PROSTATECTOMY     robotic   Past Medical History:  Diagnosis Date   BPH (benign prostatic hypertrophy)    Bradycardia    evaluated by Dr. Nehemiah Massed   [facility]    prostate   Carpal tunnel syndrome, left    left arm   CKD (chronic kidney disease) stage 2, GFR 60-89 ml/min    Diverticulitis    Gastritis 2017   History of prostate cancer    HNP (herniated nucleus pulposus), lumbar    Hyperlipidemia    Hypertension    IBS (irritable bowel syndrome)    Impotence    Multiple lung nodules    nonspecific, largest 42m in Jan 2016; scanned March 2017; rescan 3-6 months later   OA (osteoarthritis) of knee    right   Renal mass    evaluated; followed by urologist   Sleep apnea    on CPAP   Torn meniscus    x 2; right knee   Ht 6' (1.829 m)   Wt 222  lb (100.7 kg)   BMI 30.11 kg/m   Opioid Risk Score:   Fall Risk Score:  `1  Depression screen [facility]     09/30/2022   11:53 AM 09/24/2022    1:02 PM 09/19/2022    1:22 PM 07/18/2022   10:13 AM 07/09/2022    1:31 PM 04/02/2022   12:47 PM 03/21/2022    9:42 AM  Depression screen PHQ 2/9  Decreased Interest 0 0 0 0 0 0 0  Down, Depressed, Hopeless 0 0 0 0 0 0 0  PHQ - 2 Score 0 0 0 0 0 0 0  Altered sleeping  0 0    0  Tired, decreased energy  0 0    0  Change in appetite  0 0    0  Feeling bad or failure about yourself   0 0    0  Trouble concentrating  0 0    0  Moving slowly or fidgety/restless  0 0    0  Suicidal thoughts  0 0    0  PHQ-9 Score  0 0    0  Difficult doing work/chores  Not difficult at all     Not difficult at all      Review of Systems  Musculoskeletal:  Positive for gait problem.       Left hand pain   All other systems reviewed and are negative.     Objective:   Physical Exam  General no acute distress Mood and affect are appropriate Extremities without edema No pain with cervical spine range of motion.  No limitation. Upper extremity strength is 5/5 bilateral deltoid, bicep, tricep, grip Lower extremity strength is 5/5 bilateral hip flexor knee extensor ankle dorsiflexor There is mild dysmetria on left finger-nose-finger and no dysmetria right finger-nose-finger no dysmetria with heel-to-shin testing bilaterally Sensation intact to light touch bilaterally Extraocular muscles intact Visual fields are intact Ambulates with a walker no evidence of toe drag or knee instability sit to stand is modified  independent using a walker.       Assessment & Plan:  1.  History of small right pontine and right cerebellar infarcts making excellent recovery.  Still has mild balance disorder.  His recovery is impeded by chronic knee pain, right greater than the left side with x-ray evidence showing tricompartmental OA.  At this point not needing injection. He is  following up with vascular surgery for right ICA stent given the severe stenosis.  He has not had a stroke in that distribution.  He is also getting a Holter monitor recommended by cardiology to look for A-fib given the fact his most recent stroke was vertebrobasilar system rather than the ICA. Physical medicine rehab follow-up in 3 months we will look at his driving at that point.  Certainly if he continues to make good improvements in the intervening time, one of his other physicians can clear him.

## 2022-10-28 DIAGNOSIS — M170 Bilateral primary osteoarthritis of knee: Secondary | ICD-10-CM | POA: Diagnosis not present

## 2022-10-28 DIAGNOSIS — M25461 Effusion, right knee: Secondary | ICD-10-CM | POA: Diagnosis not present

## 2022-10-28 DIAGNOSIS — N2889 Other specified disorders of kidney and ureter: Secondary | ICD-10-CM | POA: Diagnosis not present

## 2022-10-28 DIAGNOSIS — K5792 Diverticulitis of intestine, part unspecified, without perforation or abscess without bleeding: Secondary | ICD-10-CM | POA: Diagnosis not present

## 2022-10-28 DIAGNOSIS — I129 Hypertensive chronic kidney disease with stage 1 through stage 4 chronic kidney disease, or unspecified chronic kidney disease: Secondary | ICD-10-CM | POA: Diagnosis not present

## 2022-10-28 DIAGNOSIS — N1831 Chronic kidney disease, stage 3a: Secondary | ICD-10-CM | POA: Diagnosis not present

## 2022-10-28 DIAGNOSIS — I69393 Ataxia following cerebral infarction: Secondary | ICD-10-CM | POA: Diagnosis not present

## 2022-10-28 DIAGNOSIS — I2693 Single subsegmental pulmonary embolism without acute cor pulmonale: Secondary | ICD-10-CM | POA: Diagnosis not present

## 2022-10-28 DIAGNOSIS — G5602 Carpal tunnel syndrome, left upper limb: Secondary | ICD-10-CM | POA: Diagnosis not present

## 2022-10-28 DIAGNOSIS — E785 Hyperlipidemia, unspecified: Secondary | ICD-10-CM | POA: Diagnosis not present

## 2022-10-28 DIAGNOSIS — D62 Acute posthemorrhagic anemia: Secondary | ICD-10-CM | POA: Diagnosis not present

## 2022-10-28 DIAGNOSIS — M5136 Other intervertebral disc degeneration, lumbar region: Secondary | ICD-10-CM | POA: Diagnosis not present

## 2022-10-28 DIAGNOSIS — R918 Other nonspecific abnormal finding of lung field: Secondary | ICD-10-CM | POA: Diagnosis not present

## 2022-10-28 DIAGNOSIS — I739 Peripheral vascular disease, unspecified: Secondary | ICD-10-CM | POA: Diagnosis not present

## 2022-10-28 DIAGNOSIS — G4733 Obstructive sleep apnea (adult) (pediatric): Secondary | ICD-10-CM | POA: Diagnosis not present

## 2022-10-28 DIAGNOSIS — K581 Irritable bowel syndrome with constipation: Secondary | ICD-10-CM | POA: Diagnosis not present

## 2022-10-30 ENCOUNTER — Other Ambulatory Visit: Payer: Self-pay | Admitting: Nurse Practitioner

## 2022-10-30 DIAGNOSIS — I10 Essential (primary) hypertension: Secondary | ICD-10-CM

## 2022-10-30 NOTE — Telephone Encounter (Signed)
Medication Refill - Medication: sodium chloride 1 g tablet Was prescribed by ER Dr  Has the patient contacted their pharmacy? yes (Agent: If no, request that the patient contact the pharmacy for the refill. If patient does not wish to contact the pharmacy document the reason why and proceed with request.) (Agent: If yes, when and what did the pharmacy advise?)contact pcp  Preferred Pharmacy (with phone number or street name):  [facility] [id number], [place] Phone: [phone]  Fax: [phone]     Has the patient been seen for an appointment in the last year OR does the patient have an upcoming appointment? yes  Agent: Please be advised that RX refills may take up to 3 business days. We ask that you follow-up with your pharmacy.

## 2022-10-31 ENCOUNTER — Ambulatory Visit: Payer: Medicare Other | Admitting: Urology

## 2022-10-31 VITALS — BP 198/68 | HR 79 | Ht 72.0 in | Wt 222.0 lb

## 2022-10-31 DIAGNOSIS — M170 Bilateral primary osteoarthritis of knee: Secondary | ICD-10-CM | POA: Diagnosis not present

## 2022-10-31 DIAGNOSIS — Z8546 Personal history of malignant neoplasm of prostate: Secondary | ICD-10-CM | POA: Diagnosis not present

## 2022-10-31 DIAGNOSIS — I739 Peripheral vascular disease, unspecified: Secondary | ICD-10-CM | POA: Diagnosis not present

## 2022-10-31 DIAGNOSIS — G4733 Obstructive sleep apnea (adult) (pediatric): Secondary | ICD-10-CM | POA: Diagnosis not present

## 2022-10-31 DIAGNOSIS — M25461 Effusion, right knee: Secondary | ICD-10-CM | POA: Diagnosis not present

## 2022-10-31 DIAGNOSIS — N2889 Other specified disorders of kidney and ureter: Secondary | ICD-10-CM | POA: Diagnosis not present

## 2022-10-31 DIAGNOSIS — K5792 Diverticulitis of intestine, part unspecified, without perforation or abscess without bleeding: Secondary | ICD-10-CM | POA: Diagnosis not present

## 2022-10-31 DIAGNOSIS — I69393 Ataxia following cerebral infarction: Secondary | ICD-10-CM | POA: Diagnosis not present

## 2022-10-31 DIAGNOSIS — E785 Hyperlipidemia, unspecified: Secondary | ICD-10-CM | POA: Diagnosis not present

## 2022-10-31 DIAGNOSIS — K581 Irritable bowel syndrome with constipation: Secondary | ICD-10-CM | POA: Diagnosis not present

## 2022-10-31 DIAGNOSIS — D62 Acute posthemorrhagic anemia: Secondary | ICD-10-CM | POA: Diagnosis not present

## 2022-10-31 DIAGNOSIS — I129 Hypertensive chronic kidney disease with stage 1 through stage 4 chronic kidney disease, or unspecified chronic kidney disease: Secondary | ICD-10-CM | POA: Diagnosis not present

## 2022-10-31 DIAGNOSIS — C61 Malignant neoplasm of prostate: Secondary | ICD-10-CM

## 2022-10-31 DIAGNOSIS — G5602 Carpal tunnel syndrome, left upper limb: Secondary | ICD-10-CM | POA: Diagnosis not present

## 2022-10-31 DIAGNOSIS — R3911 Hesitancy of micturition: Secondary | ICD-10-CM | POA: Diagnosis not present

## 2022-10-31 DIAGNOSIS — I2693 Single subsegmental pulmonary embolism without acute cor pulmonale: Secondary | ICD-10-CM | POA: Diagnosis not present

## 2022-10-31 DIAGNOSIS — R918 Other nonspecific abnormal finding of lung field: Secondary | ICD-10-CM | POA: Diagnosis not present

## 2022-10-31 DIAGNOSIS — M5136 Other intervertebral disc degeneration, lumbar region: Secondary | ICD-10-CM | POA: Diagnosis not present

## 2022-10-31 DIAGNOSIS — N1831 Chronic kidney disease, stage 3a: Secondary | ICD-10-CM | POA: Diagnosis not present

## 2022-10-31 LAB — BLADDER SCAN AMB NON-IMAGING: Scan Result: 48

## 2022-10-31 MED ORDER — SODIUM CHLORIDE 1 G PO TABS: 1.0000 g | ORAL_TABLET | Freq: Two times a day (BID) | ORAL | 0 refills | Status: DC

## 2022-10-31 NOTE — Telephone Encounter (Signed)
Requested medication (s) are due for refill today - yes  Requested medication (s) are on the active medication list -yes  Future visit scheduled -no  Last refill: 09/09/22 #60  Notes to clinic: outside provider  Requested Prescriptions  Pending Prescriptions Disp Refills   hydrALAZINE (APRESOLINE) 10 MG tablet [Pharmacy Med Name: HYDRALAZINE 10 MG TABLETS (ORANGE)] 30 tablet     Sig: TAKE 1 TABLET BY MOUTH TWICE DAILY AS NEEDED; TAKE IF BLOOD PRESSURE GREATER THAN 140/ 90     Cardiovascular:  Vasodilators Failed - 10/30/2022 11:48 AM      Failed - HCT in normal range and within 360 days    HCT  Date Value Ref Range Status  09/15/2022 36.7 (L) 39.0 - 52.0 % Final   Hematocrit  Date Value Ref Range Status  10/19/2015 37.6 37.5 - 51.0 % Final         Failed - HGB in normal range and within 360 days    Hemoglobin  Date Value Ref Range Status  09/15/2022 11.9 (L) 13.0 - 17.0 g/dL Final  10/19/2015 12.3 (L) 12.6 - 17.7 g/dL Final         Failed - ANA Screen, Ifa, Serum in normal range and within 360 days    No results found for: "ANA", "ANATITER", "LABANTI"       Failed - Last BP in normal range    BP Readings from Last 1 Encounters:  10/31/22 (!) 198/68         Passed - RBC in normal range and within 360 days    RBC  Date Value Ref Range Status  09/15/2022 4.36 4.22 - 5.81 MIL/uL Final         Passed - WBC in normal range and within 360 days    WBC  Date Value Ref Range Status  09/15/2022 4.8 4.0 - 10.5 K/uL Final         Passed - PLT in normal range and within 360 days    Platelets  Date Value Ref Range Status  09/15/2022 320 150 - 400 K/uL Final  10/19/2015 343 150 - 379 x10E3/uL Final         Passed - Valid encounter within last 12 months    Recent Outpatient Visits           1 month ago History of CVA (cerebrovascular accident)   [facility] Bo Merino, FNP   3 months ago PVD (peripheral vascular disease) [facility]   [facility] Bo Merino, FNP   7 months ago OSA on CPAP   [facility] Bo Merino, FNP   7 months ago Primary hypertension   [facility] Bo Merino, FNP   8 months ago Dizziness   [facility] Serafina Royals F, [place]                 Requested Prescriptions  Pending Prescriptions Disp Refills   hydrALAZINE (APRESOLINE) 10 MG tablet [Pharmacy Med Name: HYDRALAZINE 10 MG TABLETS (ORANGE)] 30 tablet     Sig: TAKE 1 TABLET BY MOUTH TWICE DAILY AS NEEDED; TAKE IF BLOOD PRESSURE GREATER THAN 140/ 90     Cardiovascular:  Vasodilators Failed - 10/30/2022 11:48 AM      Failed - HCT in normal range and within 360 days    HCT  Date Value Ref Range Status  09/15/2022 36.7 (L) 39.0 - 52.0 %  Final   Hematocrit  Date Value Ref Range Status  10/19/2015 37.6 37.5 - 51.0 % Final         Failed - HGB in normal range and within 360 days    Hemoglobin  Date Value Ref Range Status  09/15/2022 11.9 (L) 13.0 - 17.0 g/dL Final  10/19/2015 12.3 (L) 12.6 - 17.7 g/dL Final         Failed - ANA Screen, Ifa, Serum in normal range and within 360 days    No results found for: "ANA", "ANATITER", "LABANTI"       Failed - Last BP in normal range    BP Readings from Last 1 Encounters:  10/31/22 (!) 198/68         Passed - RBC in normal range and within 360 days    RBC  Date Value Ref Range Status  09/15/2022 4.36 4.22 - 5.81 MIL/uL Final         Passed - WBC in normal range and within 360 days    WBC  Date Value Ref Range Status  09/15/2022 4.8 4.0 - 10.5 K/uL Final         Passed - PLT in normal range and within 360 days    Platelets  Date Value Ref Range Status  09/15/2022 320 150 - 400 K/uL Final  10/19/2015 343 150 - 379 x10E3/uL Final         Passed - Valid encounter within last 12 months    Recent Outpatient Visits           1 month ago History of CVA  (cerebrovascular accident)   [facility] Bo Merino, FNP   3 months ago PVD (peripheral vascular disease) [facility])   [facility] Bo Merino, FNP   7 months ago OSA on CPAP   [facility] Bo Merino, FNP   7 months ago Primary hypertension   [facility] Bo Merino, FNP   8 months ago Dizziness   [facility] Bo Merino, [place]

## 2022-10-31 NOTE — Progress Notes (Signed)
   10/31/2022 9:44 AM   Jared Cline September 13, 1945 [id number]  Reason for visit: Follow up lower urinary tract symptoms, right renal mass  HPI: 77 year old male with distant history of open prostatectomy for prostate cancer and PSA remains undetectable(<0.04 07/31/2022), as well as a 1.3 cm right lower pole enhancing renal mass that has been stable since at least 2017, MRI in November 2022, and new CT A/P 08/24/22 showing stable 1.3cm lesion - I personally viewed and interpreted the most recent CT and agree with stability of this lesion.  Regarding the small renal mass, very low suspicion that this will ever be an issue as it has been stable from was 10 years, likely does not need yearly imaging at this point.  He underwent a cystoscopy in December 2022 to evaluate for any bladder neck contracture or stricture, and this was normal with no bladder or urethral abnormalities.  He reports his urinary symptoms have significantly improved by cutting back on tea and soda.  He is currently very satisfied with his urinary symptoms and really denies any complaints today.  Bladder scan is 58ml.  He will occasionally have some urgency, but no significant urge incontinence.  Occasionally has some difficulty initiating stream.  Overall minimally bothered by urinary symptoms.  Recently hospitalized for stroke and has been working with physical therapy.  No significant change in urinary symptoms since that time.  RTC 1 year symptom check regarding urinary symptoms, PVR Consider renal ultrasound every 2 years for very small right renal mass  [place], [place] [address] [place], [place] [address] [phone]

## 2022-10-31 NOTE — Telephone Encounter (Signed)
Requested medication (s) are due for refill today - unknown  Requested medication (s) are on the active medication list -yes  Future visit scheduled -no  Last refill: 09/09/22 #60  Notes to clinic: outside provider  Requested Prescriptions  Pending Prescriptions Disp Refills   sodium chloride 1 g tablet 60 tablet 0    Sig: Take 1 tablet (1 g total) by mouth 2 (two) times daily with a meal.     Off-Protocol Failed - 10/30/2022  1:20 PM      Failed - Medication not assigned to a protocol, review manually.      Passed - Valid encounter within last 12 months    Recent Outpatient Visits           1 month ago History of CVA (cerebrovascular accident)   [facility] Bo Merino, FNP   3 months ago PVD (peripheral vascular disease) [facility])   [facility] Bo Merino, FNP   7 months ago OSA on CPAP   [facility] Bo Merino, FNP   7 months ago Primary hypertension   [facility] Bo Merino, FNP   8 months ago [facility] Bo Merino, [place]                 Requested Prescriptions  Pending Prescriptions Disp Refills   sodium chloride 1 g tablet 60 tablet 0    Sig: Take 1 tablet (1 g total) by mouth 2 (two) times daily with a meal.     Off-Protocol Failed - 10/30/2022  1:20 PM      Failed - Medication not assigned to a protocol, review manually.      Passed - Valid encounter within last 12 months    Recent Outpatient Visits           1 month ago History of CVA (cerebrovascular accident)   [facility] Bo Merino, FNP   3 months ago PVD (peripheral vascular disease) [facility])   [facility] Bo Merino, FNP   7 months ago OSA on CPAP   [facility] Bo Merino, FNP   7 months ago Primary hypertension   [facility] Bo Merino, FNP   8 months ago [facility] Bo Merino, [place]

## 2022-11-04 ENCOUNTER — Telehealth: Payer: Self-pay | Admitting: Nurse Practitioner

## 2022-11-04 DIAGNOSIS — K581 Irritable bowel syndrome with constipation: Secondary | ICD-10-CM | POA: Diagnosis not present

## 2022-11-04 DIAGNOSIS — K5792 Diverticulitis of intestine, part unspecified, without perforation or abscess without bleeding: Secondary | ICD-10-CM | POA: Diagnosis not present

## 2022-11-04 DIAGNOSIS — M25461 Effusion, right knee: Secondary | ICD-10-CM | POA: Diagnosis not present

## 2022-11-04 DIAGNOSIS — D62 Acute posthemorrhagic anemia: Secondary | ICD-10-CM | POA: Diagnosis not present

## 2022-11-04 DIAGNOSIS — R918 Other nonspecific abnormal finding of lung field: Secondary | ICD-10-CM | POA: Diagnosis not present

## 2022-11-04 DIAGNOSIS — I2693 Single subsegmental pulmonary embolism without acute cor pulmonale: Secondary | ICD-10-CM | POA: Diagnosis not present

## 2022-11-04 DIAGNOSIS — E785 Hyperlipidemia, unspecified: Secondary | ICD-10-CM | POA: Diagnosis not present

## 2022-11-04 DIAGNOSIS — M5136 Other intervertebral disc degeneration, lumbar region: Secondary | ICD-10-CM | POA: Diagnosis not present

## 2022-11-04 DIAGNOSIS — I129 Hypertensive chronic kidney disease with stage 1 through stage 4 chronic kidney disease, or unspecified chronic kidney disease: Secondary | ICD-10-CM | POA: Diagnosis not present

## 2022-11-04 DIAGNOSIS — N2889 Other specified disorders of kidney and ureter: Secondary | ICD-10-CM | POA: Diagnosis not present

## 2022-11-04 DIAGNOSIS — I739 Peripheral vascular disease, unspecified: Secondary | ICD-10-CM | POA: Diagnosis not present

## 2022-11-04 DIAGNOSIS — N1831 Chronic kidney disease, stage 3a: Secondary | ICD-10-CM | POA: Diagnosis not present

## 2022-11-04 DIAGNOSIS — G5602 Carpal tunnel syndrome, left upper limb: Secondary | ICD-10-CM | POA: Diagnosis not present

## 2022-11-04 DIAGNOSIS — G4733 Obstructive sleep apnea (adult) (pediatric): Secondary | ICD-10-CM | POA: Diagnosis not present

## 2022-11-04 DIAGNOSIS — I69393 Ataxia following cerebral infarction: Secondary | ICD-10-CM | POA: Diagnosis not present

## 2022-11-04 DIAGNOSIS — M170 Bilateral primary osteoarthritis of knee: Secondary | ICD-10-CM | POA: Diagnosis not present

## 2022-11-04 NOTE — Telephone Encounter (Signed)
Please get Jared Cline to ok

## 2022-11-04 NOTE — Telephone Encounter (Unsigned)
Copied from [place] [phone]. Topic: Quick Communication - Home Health Verbal Orders >> Nov 04, 2022 11:53 AM Everette C wrote: Caller/Agency: [name] / [facility]  Callback Number: [phone] Requesting OT/PT/Skilled Nursing/Social Work/Speech Therapy: PT Frequency: 1w9

## 2022-11-06 DIAGNOSIS — D62 Acute posthemorrhagic anemia: Secondary | ICD-10-CM | POA: Diagnosis not present

## 2022-11-06 DIAGNOSIS — I739 Peripheral vascular disease, unspecified: Secondary | ICD-10-CM | POA: Diagnosis not present

## 2022-11-06 DIAGNOSIS — N2889 Other specified disorders of kidney and ureter: Secondary | ICD-10-CM | POA: Diagnosis not present

## 2022-11-06 DIAGNOSIS — K581 Irritable bowel syndrome with constipation: Secondary | ICD-10-CM | POA: Diagnosis not present

## 2022-11-06 DIAGNOSIS — K5792 Diverticulitis of intestine, part unspecified, without perforation or abscess without bleeding: Secondary | ICD-10-CM | POA: Diagnosis not present

## 2022-11-06 DIAGNOSIS — M5136 Other intervertebral disc degeneration, lumbar region: Secondary | ICD-10-CM | POA: Diagnosis not present

## 2022-11-06 DIAGNOSIS — I129 Hypertensive chronic kidney disease with stage 1 through stage 4 chronic kidney disease, or unspecified chronic kidney disease: Secondary | ICD-10-CM | POA: Diagnosis not present

## 2022-11-06 DIAGNOSIS — M25461 Effusion, right knee: Secondary | ICD-10-CM | POA: Diagnosis not present

## 2022-11-06 DIAGNOSIS — E785 Hyperlipidemia, unspecified: Secondary | ICD-10-CM | POA: Diagnosis not present

## 2022-11-06 DIAGNOSIS — G5602 Carpal tunnel syndrome, left upper limb: Secondary | ICD-10-CM | POA: Diagnosis not present

## 2022-11-06 DIAGNOSIS — G4733 Obstructive sleep apnea (adult) (pediatric): Secondary | ICD-10-CM | POA: Diagnosis not present

## 2022-11-06 DIAGNOSIS — I2693 Single subsegmental pulmonary embolism without acute cor pulmonale: Secondary | ICD-10-CM | POA: Diagnosis not present

## 2022-11-06 DIAGNOSIS — N1831 Chronic kidney disease, stage 3a: Secondary | ICD-10-CM | POA: Diagnosis not present

## 2022-11-06 DIAGNOSIS — M170 Bilateral primary osteoarthritis of knee: Secondary | ICD-10-CM | POA: Diagnosis not present

## 2022-11-06 DIAGNOSIS — I69393 Ataxia following cerebral infarction: Secondary | ICD-10-CM | POA: Diagnosis not present

## 2022-11-06 DIAGNOSIS — R918 Other nonspecific abnormal finding of lung field: Secondary | ICD-10-CM | POA: Diagnosis not present

## 2022-11-08 NOTE — Telephone Encounter (Signed)
Per pcp, I haven't seen him

## 2022-11-10 DIAGNOSIS — Z7902 Long term (current) use of antithrombotics/antiplatelets: Secondary | ICD-10-CM | POA: Diagnosis not present

## 2022-11-10 DIAGNOSIS — I739 Peripheral vascular disease, unspecified: Secondary | ICD-10-CM | POA: Diagnosis not present

## 2022-11-10 DIAGNOSIS — G5602 Carpal tunnel syndrome, left upper limb: Secondary | ICD-10-CM | POA: Diagnosis not present

## 2022-11-10 DIAGNOSIS — G4733 Obstructive sleep apnea (adult) (pediatric): Secondary | ICD-10-CM | POA: Diagnosis not present

## 2022-11-10 DIAGNOSIS — H5509 Other forms of nystagmus: Secondary | ICD-10-CM | POA: Diagnosis not present

## 2022-11-10 DIAGNOSIS — Z8546 Personal history of malignant neoplasm of prostate: Secondary | ICD-10-CM | POA: Diagnosis not present

## 2022-11-10 DIAGNOSIS — N400 Enlarged prostate without lower urinary tract symptoms: Secondary | ICD-10-CM | POA: Diagnosis not present

## 2022-11-10 DIAGNOSIS — Z79891 Long term (current) use of opiate analgesic: Secondary | ICD-10-CM | POA: Diagnosis not present

## 2022-11-10 DIAGNOSIS — I69393 Ataxia following cerebral infarction: Secondary | ICD-10-CM | POA: Diagnosis not present

## 2022-11-10 DIAGNOSIS — I129 Hypertensive chronic kidney disease with stage 1 through stage 4 chronic kidney disease, or unspecified chronic kidney disease: Secondary | ICD-10-CM | POA: Diagnosis not present

## 2022-11-10 DIAGNOSIS — E785 Hyperlipidemia, unspecified: Secondary | ICD-10-CM | POA: Diagnosis not present

## 2022-11-10 DIAGNOSIS — K5792 Diverticulitis of intestine, part unspecified, without perforation or abscess without bleeding: Secondary | ICD-10-CM | POA: Diagnosis not present

## 2022-11-10 DIAGNOSIS — Z9079 Acquired absence of other genital organ(s): Secondary | ICD-10-CM | POA: Diagnosis not present

## 2022-11-10 DIAGNOSIS — N2889 Other specified disorders of kidney and ureter: Secondary | ICD-10-CM | POA: Diagnosis not present

## 2022-11-10 DIAGNOSIS — K581 Irritable bowel syndrome with constipation: Secondary | ICD-10-CM | POA: Diagnosis not present

## 2022-11-10 DIAGNOSIS — M170 Bilateral primary osteoarthritis of knee: Secondary | ICD-10-CM | POA: Diagnosis not present

## 2022-11-10 DIAGNOSIS — Z7901 Long term (current) use of anticoagulants: Secondary | ICD-10-CM | POA: Diagnosis not present

## 2022-11-10 DIAGNOSIS — M25461 Effusion, right knee: Secondary | ICD-10-CM | POA: Diagnosis not present

## 2022-11-10 DIAGNOSIS — N1831 Chronic kidney disease, stage 3a: Secondary | ICD-10-CM | POA: Diagnosis not present

## 2022-11-10 DIAGNOSIS — M5136 Other intervertebral disc degeneration, lumbar region: Secondary | ICD-10-CM | POA: Diagnosis not present

## 2022-11-10 DIAGNOSIS — I2693 Single subsegmental pulmonary embolism without acute cor pulmonale: Secondary | ICD-10-CM | POA: Diagnosis not present

## 2022-11-11 NOTE — Telephone Encounter (Signed)
Left message on [employer]

## 2022-11-15 DIAGNOSIS — G5602 Carpal tunnel syndrome, left upper limb: Secondary | ICD-10-CM | POA: Diagnosis not present

## 2022-11-15 DIAGNOSIS — N400 Enlarged prostate without lower urinary tract symptoms: Secondary | ICD-10-CM | POA: Diagnosis not present

## 2022-11-15 DIAGNOSIS — E785 Hyperlipidemia, unspecified: Secondary | ICD-10-CM | POA: Diagnosis not present

## 2022-11-15 DIAGNOSIS — K581 Irritable bowel syndrome with constipation: Secondary | ICD-10-CM | POA: Diagnosis not present

## 2022-11-15 DIAGNOSIS — I129 Hypertensive chronic kidney disease with stage 1 through stage 4 chronic kidney disease, or unspecified chronic kidney disease: Secondary | ICD-10-CM | POA: Diagnosis not present

## 2022-11-15 DIAGNOSIS — I69393 Ataxia following cerebral infarction: Secondary | ICD-10-CM | POA: Diagnosis not present

## 2022-11-15 DIAGNOSIS — G4733 Obstructive sleep apnea (adult) (pediatric): Secondary | ICD-10-CM | POA: Diagnosis not present

## 2022-11-15 DIAGNOSIS — K5792 Diverticulitis of intestine, part unspecified, without perforation or abscess without bleeding: Secondary | ICD-10-CM | POA: Diagnosis not present

## 2022-11-15 DIAGNOSIS — H5509 Other forms of nystagmus: Secondary | ICD-10-CM | POA: Diagnosis not present

## 2022-11-15 DIAGNOSIS — I739 Peripheral vascular disease, unspecified: Secondary | ICD-10-CM | POA: Diagnosis not present

## 2022-11-15 DIAGNOSIS — M5136 Other intervertebral disc degeneration, lumbar region: Secondary | ICD-10-CM | POA: Diagnosis not present

## 2022-11-15 DIAGNOSIS — M25461 Effusion, right knee: Secondary | ICD-10-CM | POA: Diagnosis not present

## 2022-11-15 DIAGNOSIS — N2889 Other specified disorders of kidney and ureter: Secondary | ICD-10-CM | POA: Diagnosis not present

## 2022-11-15 DIAGNOSIS — M170 Bilateral primary osteoarthritis of knee: Secondary | ICD-10-CM | POA: Diagnosis not present

## 2022-11-15 DIAGNOSIS — N1831 Chronic kidney disease, stage 3a: Secondary | ICD-10-CM | POA: Diagnosis not present

## 2022-11-15 DIAGNOSIS — I2693 Single subsegmental pulmonary embolism without acute cor pulmonale: Secondary | ICD-10-CM | POA: Diagnosis not present

## 2022-11-19 ENCOUNTER — Telehealth (INDEPENDENT_AMBULATORY_CARE_PROVIDER_SITE_OTHER): Payer: Self-pay

## 2022-11-19 DIAGNOSIS — I69393 Ataxia following cerebral infarction: Secondary | ICD-10-CM | POA: Diagnosis not present

## 2022-11-19 DIAGNOSIS — I739 Peripheral vascular disease, unspecified: Secondary | ICD-10-CM | POA: Diagnosis not present

## 2022-11-19 DIAGNOSIS — I129 Hypertensive chronic kidney disease with stage 1 through stage 4 chronic kidney disease, or unspecified chronic kidney disease: Secondary | ICD-10-CM | POA: Diagnosis not present

## 2022-11-19 DIAGNOSIS — M5136 Other intervertebral disc degeneration, lumbar region: Secondary | ICD-10-CM | POA: Diagnosis not present

## 2022-11-19 DIAGNOSIS — K581 Irritable bowel syndrome with constipation: Secondary | ICD-10-CM | POA: Diagnosis not present

## 2022-11-19 DIAGNOSIS — N2889 Other specified disorders of kidney and ureter: Secondary | ICD-10-CM | POA: Diagnosis not present

## 2022-11-19 DIAGNOSIS — I2693 Single subsegmental pulmonary embolism without acute cor pulmonale: Secondary | ICD-10-CM | POA: Diagnosis not present

## 2022-11-19 DIAGNOSIS — N400 Enlarged prostate without lower urinary tract symptoms: Secondary | ICD-10-CM | POA: Diagnosis not present

## 2022-11-19 DIAGNOSIS — M170 Bilateral primary osteoarthritis of knee: Secondary | ICD-10-CM | POA: Diagnosis not present

## 2022-11-19 DIAGNOSIS — G4733 Obstructive sleep apnea (adult) (pediatric): Secondary | ICD-10-CM | POA: Diagnosis not present

## 2022-11-19 DIAGNOSIS — H5509 Other forms of nystagmus: Secondary | ICD-10-CM | POA: Diagnosis not present

## 2022-11-19 DIAGNOSIS — E785 Hyperlipidemia, unspecified: Secondary | ICD-10-CM | POA: Diagnosis not present

## 2022-11-19 DIAGNOSIS — K5792 Diverticulitis of intestine, part unspecified, without perforation or abscess without bleeding: Secondary | ICD-10-CM | POA: Diagnosis not present

## 2022-11-19 DIAGNOSIS — M25461 Effusion, right knee: Secondary | ICD-10-CM | POA: Diagnosis not present

## 2022-11-19 DIAGNOSIS — G5602 Carpal tunnel syndrome, left upper limb: Secondary | ICD-10-CM | POA: Diagnosis not present

## 2022-11-19 DIAGNOSIS — N1831 Chronic kidney disease, stage 3a: Secondary | ICD-10-CM | POA: Diagnosis not present

## 2022-11-19 NOTE — Telephone Encounter (Signed)
Spoke with the patient and his spouse, patient is scheduled with Dr. Delana Meyer on 11/26/22 with a 9:00 am arrival for a right carotid stent placement at the [facility]. Pre-procedure instructions were discussed  and will be mailed.

## 2022-11-22 DIAGNOSIS — M170 Bilateral primary osteoarthritis of knee: Secondary | ICD-10-CM | POA: Diagnosis not present

## 2022-11-26 ENCOUNTER — Other Ambulatory Visit: Payer: Self-pay

## 2022-11-26 ENCOUNTER — Inpatient Hospital Stay
Admission: RE | Admit: 2022-11-26 | Discharge: 2022-11-27 | DRG: 036 | Disposition: A | Discharge status: Home / Self Care | Payer: Medicare Other | Attending: Vascular Surgery | Admitting: Vascular Surgery

## 2022-11-26 ENCOUNTER — Encounter
Admission: RE | Disposition: A | Discharge status: Home / Self Care | Payer: Self-pay | Source: Home / Self Care | Attending: Vascular Surgery

## 2022-11-26 ENCOUNTER — Encounter: Payer: Self-pay | Admitting: Vascular Surgery

## 2022-11-26 DIAGNOSIS — Z8546 Personal history of malignant neoplasm of prostate: Secondary | ICD-10-CM

## 2022-11-26 DIAGNOSIS — Z87891 Personal history of nicotine dependence: Secondary | ICD-10-CM | POA: Diagnosis not present

## 2022-11-26 DIAGNOSIS — Z7982 Long term (current) use of aspirin: Secondary | ICD-10-CM | POA: Diagnosis not present

## 2022-11-26 DIAGNOSIS — I129 Hypertensive chronic kidney disease with stage 1 through stage 4 chronic kidney disease, or unspecified chronic kidney disease: Secondary | ICD-10-CM | POA: Diagnosis not present

## 2022-11-26 DIAGNOSIS — Z823 Family history of stroke: Secondary | ICD-10-CM

## 2022-11-26 DIAGNOSIS — I25119 Atherosclerotic heart disease of native coronary artery with unspecified angina pectoris: Secondary | ICD-10-CM | POA: Diagnosis not present

## 2022-11-26 DIAGNOSIS — Z7901 Long term (current) use of anticoagulants: Secondary | ICD-10-CM

## 2022-11-26 DIAGNOSIS — Z811 Family history of alcohol abuse and dependence: Secondary | ICD-10-CM | POA: Diagnosis not present

## 2022-11-26 DIAGNOSIS — E782 Mixed hyperlipidemia: Secondary | ICD-10-CM | POA: Diagnosis not present

## 2022-11-26 DIAGNOSIS — I700 Atherosclerosis of aorta: Secondary | ICD-10-CM | POA: Diagnosis present

## 2022-11-26 DIAGNOSIS — N400 Enlarged prostate without lower urinary tract symptoms: Secondary | ICD-10-CM | POA: Diagnosis not present

## 2022-11-26 DIAGNOSIS — Z8673 Personal history of transient ischemic attack (TIA), and cerebral infarction without residual deficits: Secondary | ICD-10-CM

## 2022-11-26 DIAGNOSIS — Z8249 Family history of ischemic heart disease and other diseases of the circulatory system: Secondary | ICD-10-CM

## 2022-11-26 DIAGNOSIS — I6521 Occlusion and stenosis of right carotid artery: Secondary | ICD-10-CM | POA: Diagnosis not present

## 2022-11-26 DIAGNOSIS — Z86711 Personal history of pulmonary embolism: Secondary | ICD-10-CM | POA: Diagnosis not present

## 2022-11-26 DIAGNOSIS — N182 Chronic kidney disease, stage 2 (mild): Secondary | ICD-10-CM | POA: Diagnosis present

## 2022-11-26 HISTORY — PX: CAROTID PTA/STENT INTERVENTION: CATH118231

## 2022-11-26 HISTORY — DX: Cerebral infarction, unspecified: I63.9

## 2022-11-26 LAB — BUN: BUN: 19 mg/dL (ref 8–23)

## 2022-11-26 LAB — POCT ACTIVATED CLOTTING TIME: Activated Clotting Time: 244 seconds

## 2022-11-26 LAB — CREATININE, SERUM
Creatinine, Ser: 1.32 mg/dL — ABNORMAL HIGH (ref 0.61–1.24)
GFR, Estimated: 55 mL/min — ABNORMAL LOW (ref >60)

## 2022-11-26 SURGERY — CAROTID PTA/STENT INTERVENTION
Anesthesia: Moderate Sedation | Laterality: Right

## 2022-11-26 MED ORDER — FENTANYL CITRATE (PF) 100 MCG/2ML IJ SOLN
INTRAMUSCULAR | Status: DC | PRN
  Administered 2022-11-26: 50 ug via INTRAVENOUS
  Administered 2022-11-26: 25 ug via INTRAVENOUS

## 2022-11-26 MED ORDER — PHENYLEPHRINE HCL (PRESSORS) 10 MG/ML IV SOLN
INTRAVENOUS | Status: AC
  Filled 2022-11-26: qty 1

## 2022-11-26 MED ORDER — HEPARIN SODIUM (PORCINE) 1000 UNIT/ML IJ SOLN
INTRAMUSCULAR | Status: DC | PRN
  Administered 2022-11-26: 10000 [IU] via INTRAVENOUS
  Administered 2022-11-26: 4000 [IU] via INTRAVENOUS

## 2022-11-26 MED ORDER — ASPIRIN 325 MG PO TBEC
325.0000 mg | DELAYED_RELEASE_TABLET | ORAL | Status: AC
  Administered 2022-11-26: 325 mg via ORAL

## 2022-11-26 MED ORDER — CLOPIDOGREL BISULFATE 75 MG PO TABS
ORAL_TABLET | ORAL | Status: AC
  Filled 2022-11-26: qty 2

## 2022-11-26 MED ORDER — PANTOPRAZOLE SODIUM 40 MG IV SOLR
40.0000 mg | Freq: Every day | INTRAVENOUS | Status: DC
  Administered 2022-11-26: 40 mg via INTRAVENOUS

## 2022-11-26 MED ORDER — HYDROMORPHONE HCL 1 MG/ML IJ SOLN: 1.0000 mg | Freq: Once | INTRAMUSCULAR | Status: DC | PRN

## 2022-11-26 MED ORDER — HEPARIN SODIUM (PORCINE) 1000 UNIT/ML IJ SOLN
INTRAMUSCULAR | Status: AC
  Filled 2022-11-26: qty 10

## 2022-11-26 MED ORDER — METOPROLOL TARTRATE 5 MG/5ML IV SOLN: 2.0000 mg | INTRAVENOUS | Status: DC | PRN

## 2022-11-26 MED ORDER — METHYLPREDNISOLONE SODIUM SUCC 125 MG IJ SOLR: 125.0000 mg | Freq: Once | INTRAMUSCULAR | Status: DC | PRN

## 2022-11-26 MED ORDER — PRAVASTATIN SODIUM 40 MG PO TABS
80.0000 mg | ORAL_TABLET | Freq: Every day | ORAL | Status: DC
  Administered 2022-11-26: 80 mg via ORAL

## 2022-11-26 MED ORDER — PHENYLEPHRINE 80 MCG/ML (10ML) SYRINGE FOR IV PUSH (FOR BLOOD PRESSURE SUPPORT)
PREFILLED_SYRINGE | INTRAVENOUS | Status: AC
  Filled 2022-11-26: qty 10

## 2022-11-26 MED ORDER — KCL IN DEXTROSE-NACL 20-5-0.9 MEQ/L-%-% IV SOLN
INTRAVENOUS | Status: DC
  Filled 2022-11-26 (×3): qty 1000

## 2022-11-26 MED ORDER — CEFAZOLIN SODIUM-DEXTROSE 2-4 GM/100ML-% IV SOLN
INTRAVENOUS | Status: AC
  Filled 2022-11-26: qty 100

## 2022-11-26 MED ORDER — PRAVASTATIN SODIUM 40 MG PO TABS
ORAL_TABLET | ORAL | Status: AC
  Filled 2022-11-26: qty 2

## 2022-11-26 MED ORDER — SODIUM CHLORIDE 0.9 % IV SOLN: 500.0000 mL | Freq: Once | INTRAVENOUS | Status: DC | PRN

## 2022-11-26 MED ORDER — ONDANSETRON HCL 4 MG/2ML IJ SOLN: 4.0000 mg | Freq: Four times a day (QID) | INTRAMUSCULAR | Status: DC | PRN

## 2022-11-26 MED ORDER — FENTANYL CITRATE PF 50 MCG/ML IJ SOSY
PREFILLED_SYRINGE | INTRAMUSCULAR | Status: AC
  Filled 2022-11-26: qty 1

## 2022-11-26 MED ORDER — HYDRALAZINE HCL 20 MG/ML IJ SOLN: 5.0000 mg | INTRAMUSCULAR | Status: DC | PRN

## 2022-11-26 MED ORDER — ATROPINE SULFATE 1 MG/10ML IJ SOSY
PREFILLED_SYRINGE | INTRAMUSCULAR | Status: AC
  Filled 2022-11-26: qty 10

## 2022-11-26 MED ORDER — MIDAZOLAM HCL 2 MG/2ML IJ SOLN
INTRAMUSCULAR | Status: DC | PRN
  Administered 2022-11-26: 2 mg via INTRAVENOUS
  Administered 2022-11-26: .5 mg via INTRAVENOUS

## 2022-11-26 MED ORDER — APIXABAN 2.5 MG PO TABS
5.0000 mg | ORAL_TABLET | Freq: Two times a day (BID) | ORAL | Status: DC
  Administered 2022-11-26 – 2022-11-27 (×2): 5 mg via ORAL
  Filled 2022-11-26: qty 1

## 2022-11-26 MED ORDER — ASPIRIN 81 MG PO TBEC
81.0000 mg | DELAYED_RELEASE_TABLET | Freq: Every day | ORAL | Status: DC
  Filled 2022-11-26: qty 1

## 2022-11-26 MED ORDER — MIDAZOLAM HCL 2 MG/ML PO SYRP: 8.0000 mg | ORAL_SOLUTION | Freq: Once | ORAL | Status: DC | PRN

## 2022-11-26 MED ORDER — LABETALOL HCL 5 MG/ML IV SOLN: 10.0000 mg | INTRAVENOUS | Status: DC | PRN

## 2022-11-26 MED ORDER — PHENYLEPHRINE HCL-NACL 20-0.9 MG/250ML-% IV SOLN
INTRAVENOUS | Status: AC
  Filled 2022-11-26: qty 250

## 2022-11-26 MED ORDER — CEFAZOLIN SODIUM-DEXTROSE 2-4 GM/100ML-% IV SOLN
2.0000 g | Freq: Three times a day (TID) | INTRAVENOUS | Status: AC
  Administered 2022-11-26 – 2022-11-27 (×2): 2 g via INTRAVENOUS

## 2022-11-26 MED ORDER — ALUM & MAG HYDROXIDE-SIMETH 200-200-20 MG/5ML PO SUSP: 15.0000 mL | ORAL | Status: DC | PRN

## 2022-11-26 MED ORDER — ASPIRIN 325 MG PO TBEC
DELAYED_RELEASE_TABLET | ORAL | Status: AC
  Filled 2022-11-26: qty 1

## 2022-11-26 MED ORDER — MIDAZOLAM HCL 2 MG/2ML IJ SOLN
INTRAMUSCULAR | Status: AC
  Filled 2022-11-26: qty 2

## 2022-11-26 MED ORDER — CEFAZOLIN SODIUM-DEXTROSE 2-4 GM/100ML-% IV SOLN
2.0000 g | INTRAVENOUS | Status: AC
  Administered 2022-11-26: 2 g via INTRAVENOUS

## 2022-11-26 MED ORDER — IODIXANOL 320 MG/ML IV SOLN
INTRAVENOUS | Status: DC | PRN
  Administered 2022-11-26: 60 mL via INTRA_ARTERIAL

## 2022-11-26 MED ORDER — SODIUM CHLORIDE 0.9 % IV SOLN: INTRAVENOUS | Status: DC

## 2022-11-26 MED ORDER — ACETAMINOPHEN 325 MG RE SUPP: 325.0000 mg | RECTAL | Status: DC | PRN

## 2022-11-26 MED ORDER — CLOPIDOGREL BISULFATE 75 MG PO TABS
150.0000 mg | ORAL_TABLET | ORAL | Status: AC
  Administered 2022-11-26: 150 mg via ORAL

## 2022-11-26 MED ORDER — PHENOL 1.4 % MT LIQD: 1.0000 | OROMUCOSAL | Status: DC | PRN

## 2022-11-26 MED ORDER — ATROPINE SULFATE 1 MG/ML IV SOLN
INTRAVENOUS | Status: DC | PRN
  Administered 2022-11-26 (×2): 1 mg via INTRAVENOUS

## 2022-11-26 MED ORDER — DOCUSATE SODIUM 100 MG PO CAPS
100.0000 mg | ORAL_CAPSULE | Freq: Every day | ORAL | Status: DC
  Administered 2022-11-27: 100 mg via ORAL

## 2022-11-26 MED ORDER — APIXABAN 2.5 MG PO TABS
ORAL_TABLET | ORAL | Status: AC
  Filled 2022-11-26: qty 2

## 2022-11-26 MED ORDER — ACETAMINOPHEN 325 MG PO TABS: 325.0000 mg | ORAL_TABLET | ORAL | Status: DC | PRN

## 2022-11-26 MED ORDER — DIPHENHYDRAMINE HCL 50 MG/ML IJ SOLN: 50.0000 mg | Freq: Once | INTRAMUSCULAR | Status: DC | PRN

## 2022-11-26 MED ORDER — FAMOTIDINE 20 MG PO TABS: 40.0000 mg | ORAL_TABLET | Freq: Once | ORAL | Status: DC | PRN

## 2022-11-26 MED ORDER — PANTOPRAZOLE SODIUM 40 MG IV SOLR
INTRAVENOUS | Status: AC
  Filled 2022-11-26: qty 10

## 2022-11-26 MED ORDER — TRAZODONE HCL 50 MG PO TABS: 25.0000 mg | ORAL_TABLET | Freq: Every evening | ORAL | Status: DC | PRN

## 2022-11-26 MED ORDER — GUAIFENESIN-DM 100-10 MG/5ML PO SYRP: 15.0000 mL | ORAL_SOLUTION | ORAL | Status: DC | PRN

## 2022-11-26 MED ORDER — CLOPIDOGREL BISULFATE 75 MG PO TABS
75.0000 mg | ORAL_TABLET | Freq: Every day | ORAL | Status: DC
  Administered 2022-11-27: 75 mg via ORAL

## 2022-11-26 SURGICAL SUPPLY — 27 items
BALLN VIATRAC 4X30X135 (BALLOONS) ×1
BALLN VIATRAC 5X30X135 (BALLOONS) ×1
BALLOON VIATRAC 4X30X135 (BALLOONS) IMPLANT
BALLOON VIATRAC 5X30X135 (BALLOONS) IMPLANT
CATH ANGIO 5F PIGTAIL 100CM (CATHETERS) IMPLANT
CATH BEACON 5 .035 100 H1 TIP (CATHETERS) IMPLANT
COVER DRAPE FLUORO 36X44 (DRAPES) IMPLANT
COVER PROBE ULTRASOUND 5X96 (MISCELLANEOUS) IMPLANT
DEVICE EMBOSHIELD NAV6 4.0-7.0 (FILTER) IMPLANT
DEVICE SAFEGUARD 24CM (GAUZE/BANDAGES/DRESSINGS) IMPLANT
DEVICE STARCLOSE SE CLOSURE (Vascular Products) IMPLANT
DEVICE TORQUE .025-.038 (MISCELLANEOUS) IMPLANT
GLIDEWIRE ANGLED SS 035X260CM (WIRE) IMPLANT
GUIDEWIRE VASC STIFF .038X260 (WIRE) IMPLANT
KIT ENCORE 26 ADVANTAGE (KITS) IMPLANT
KIT MANI 3VAL PERCEP (MISCELLANEOUS) IMPLANT
NDL ENTRY 21GA 7CM ECHOTIP (NEEDLE) IMPLANT
NEEDLE ENTRY 21GA 7CM ECHOTIP (NEEDLE) ×1 IMPLANT
PACK ANGIOGRAPHY (CUSTOM PROCEDURE TRAY) ×1 IMPLANT
SET INTRO CAPELLA COAXIAL (SET/KITS/TRAYS/PACK) IMPLANT
SHEATH BRITE TIP 6FRX11 (SHEATH) IMPLANT
SHEATH NEURON MAX 6FR 90CM (SHEATH) IMPLANT
STENT XACT CAR 9-7X40X136 (Permanent Stent) IMPLANT
SUT MNCRL AB 4-0 PS2 18 (SUTURE) IMPLANT
SYR MEDRAD MARK 7 150ML (SYRINGE) IMPLANT
TUBING CONTRAST HIGH PRESS 72 (TUBING) IMPLANT
WIRE GUIDERIGHT .035X150 (WIRE) IMPLANT

## 2022-11-26 NOTE — Progress Notes (Signed)
Dr Gilda Crease talking to son at bedside, pt sips of water, wife has visited

## 2022-11-26 NOTE — H&P (View-Only) (Signed)
    MRN : [id number]  Jared Cline is a 77 y.o. (07/22/1946) male who presents with chief complaint of check carotid arteries.  History of Present Illness:   The patient is seen for evaluation of carotid stenosis. The carotid stenosis was identified after CTA dated 09/15/2022.  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   Greater than 90% stneosis of the Right ICA origin due to bulky calcified plaque.  The lesion is moderately calcified.  He does have bilateral retropharyngeal internal carotids.  The upper extent of his right common carotid lesion is at the the level of the C1-C2 disc space.  This is a very high lesion.   The patient denies amaurosis fugax. There is recent history of TIA symptoms with significant balance disturbances associated with weakness of his left side. There is a prior documented CVA.   There is no history of migraine headaches. There is no history of seizures.   The patient is taking Eliquis secondary to a history of PE.  He is not taking any antiplatelet medications at this time.   No recent shortening of the patient's walking distance or new symptoms consistent with claudication.  No history of rest pain symptoms. No new ulcers or wounds of the lower extremities have occurred.   There is no history of DVT, PE or superficial thrombophlebitis. No recent episodes of angina or shortness of breath documented.     Current Meds  Medication Sig   acetaminophen (TYLENOL) 650 MG CR tablet Take 2 tablets (1,300 mg total) by mouth every 12 (twelve) hours as needed (Mild pain).   apixaban (ELIQUIS) 5 MG TABS tablet Take 1 tablet (5 mg total) by mouth 2 (two) times  daily.   cholecalciferol (VITAMIN D3) 25 MCG (1000 UNIT) tablet Take 1,000 Units by mouth daily.   CINNAMON PO Take by mouth.   cloNIDine (CATAPRES) 0.3 MG tablet Take 1 tablet (0.3 mg total) by mouth 2 (two) times daily.   cyanocobalamin (VITAMIN B12) 1000 MCG tablet Take 1,000 mcg by mouth daily.   diclofenac Sodium (VOLTAREN) 1 % GEL Apply 2 g topically 4 (four) times daily.   GARLIC PO Take by mouth.   hydrALAZINE (APRESOLINE) 10 MG tablet TAKE 1 TABLET BY MOUTH TWICE DAILY AS NEEDED; TAKE IF BLOOD PRESSURE GREATER THAN 140/ 90   labetalol (NORMODYNE) 100 MG tablet    olmesartan (BENICAR) 20 MG tablet Take 20 mg by mouth daily.    Past Medical History:  Diagnosis Date   BPH (benign prostatic hypertrophy)    Bradycardia    evaluated by Dr. Kowalski   Cancer    prostate   Carpal tunnel syndrome, left    left arm   CKD (chronic kidney disease) stage 2, GFR 60-89 ml/min    Diverticulitis    Gastritis 2017   History of prostate cancer    HNP (herniated nucleus pulposus), lumbar    Hyperlipidemia    Hypertension    IBS (irritable bowel syndrome)    Impotence    Multiple lung nodules    nonspecific, largest 5mm in Jan 2016; scanned March 2017; rescan 3-6 months later   OA (osteoarthritis) of knee    right   Renal   mass    evaluated; followed by urologist   Sleep apnea    on CPAP   Stroke    Torn meniscus    x 2; right knee    Past Surgical History:  Procedure Laterality Date   abdominal ultrasound  Oct 2015   Negative for eval for AAA   COLONOSCOPY     KNEE ARTHROSCOPY Right    x 2   LUMBAR LAMINECTOMY     PROSTATECTOMY     robotic    Social History Social History   Tobacco Use   Smoking status: Former    Packs/day: 0.50    Years: 9.00    Additional pack years: 0.00    Total pack years: 4.50    Types: Cigarettes    Quit date: 08/20/1971    Years since quitting: 51.3    Passive exposure: Past   Smokeless tobacco: Never  Vaping Use   Vaping Use: Never  used  Substance Use Topics   Alcohol use: No    Alcohol/week: 0.0 standard drinks of alcohol   Drug use: No    Family History Family History  Problem Relation Age of Onset   Stroke Mother    Hypertension Mother    Aneurysm Father        cerebral   Hypertension Father    Heart disease Paternal Uncle    Alcohol abuse Brother    Cancer Neg Hx    COPD Neg Hx    Diabetes Neg Hx     Allergies  Allergen Reactions   Flomax [Tamsulosin Hcl] Shortness Of Breath   Ace Inhibitors Other (See Comments)    Angioedema   Amlodipine Other (See Comments)    Angioedema   Doxazosin    Lipitor [Atorvastatin] Other (See Comments)    Memory loss     REVIEW OF SYSTEMS (Negative unless checked)  Constitutional: []Weight loss  []Fever  []Chills Cardiac: []Chest pain   []Chest pressure   []Palpitations   []Shortness of breath when laying flat   []Shortness of breath with exertion. Vascular:  [x]Pain in legs with walking   []Pain in legs at rest  []History of DVT   []Phlebitis   []Swelling in legs   []Varicose veins   []Non-healing ulcers Pulmonary:   []Uses home oxygen   []Productive cough   []Hemoptysis   []Wheeze  []COPD   []Asthma Neurologic:  []Dizziness   []Seizures   []History of stroke   []History of TIA  []Aphasia   []Vissual changes   []Weakness or numbness in arm   []Weakness or numbness in leg Musculoskeletal:   []Joint swelling   []Joint pain   []Low back pain Hematologic:  []Easy bruising  []Easy bleeding   []Hypercoagulable state   []Anemic Gastrointestinal:  []Diarrhea   []Vomiting  []Gastroesophageal reflux/heartburn   []Difficulty swallowing. Genitourinary:  []Chronic kidney disease   []Difficult urination  []Frequent urination   []Blood in urine Skin:  []Rashes   []Ulcers  Psychological:  []History of anxiety   [] History of major depression.  Physical Examination  Vitals:   11/26/22 0938 11/26/22 1214  BP: (!) 169/78 (!) 137/109  Pulse: 72   Resp: 17 15  Temp: 98.3 F  (36.8 C)   TempSrc: Oral   SpO2: 99% 100%  Weight: 100.7 kg   Height: 6' (1.829 m)    Body mass index is 30.11 kg/m. Gen: WD/WN, NAD Head: [place]/AT, No temporalis wasting.  Ear/Nose/Throat: Hearing grossly intact, nares w/o erythema or drainage Eyes:   PER, EOMI, sclera nonicteric.  Neck: Supple, no masses.  No bruit or JVD.  Pulmonary:  Good air movement, no audible wheezing, no use of accessory muscles.  Cardiac: RRR, normal S1, S2, no Murmurs. Vascular:  carotid bruit noted Vessel Right Left  Radial Palpable Palpable  Carotid  Palpable  Palpable  Subclav  Palpable Palpable  Gastrointestinal: soft, non-distended. No guarding/no peritoneal signs.  Musculoskeletal: M/S 5/5 throughout.  No visible deformity.  Neurologic: CN 2-12 intact. Pain and light touch intact in extremities.  Symmetrical.  Speech is fluent. Motor exam as listed above. Psychiatric: Judgment intact, Mood & affect appropriate for pt's clinical situation. Dermatologic: No rashes or ulcers noted.  No changes consistent with cellulitis.   CBC Lab Results  Component Value Date   WBC 4.8 09/15/2022   HGB 11.9 (L) 09/15/2022   HCT 36.7 (L) 09/15/2022   MCV 84.2 09/15/2022   PLT 320 09/15/2022    BMET    Component Value Date/Time   NA 132 (L) 09/15/2022 1006   NA 135 10/19/2015 1048   NA 133 (L) 08/25/2014 1020   K 3.7 09/15/2022 1006   K 4.1 08/25/2014 1020   CL 105 09/15/2022 1006   CL 100 08/25/2014 1020   CO2 24 09/15/2022 1006   CO2 27 08/25/2014 1020   GLUCOSE 97 09/15/2022 1006   GLUCOSE 95 08/25/2014 1020   BUN 19 11/26/2022 0955   BUN 12 10/19/2015 1048   BUN 13 08/25/2014 1020   CREATININE 1.32 (H) 11/26/2022 0955   CREATININE 1.55 (H) 02/20/2022 1439   CALCIUM 9.3 09/15/2022 1006   CALCIUM 9.1 08/25/2014 1020   GFRNONAA 55 (L) 11/26/2022 0955   GFRNONAA 49 (L) 07/21/2020 0930   GFRAA 57 (L) 07/21/2020 0930   Estimated Creatinine Clearance: 56.6 mL/min (A) (by C-G formula based on SCr of  1.32 mg/dL (H)).  COAG No results found for: "INR", "PROTIME"  Radiology No results found.   Assessment/Plan 1. Symptomatic carotid artery stenosis, unspecified laterality Recommend:   The patient is symptomatic with respect to the carotid stenosis.  The patient now has progressed and has a lesion the is >90%.   I have completed Shared Decision-Making with Jared Milesprior to carotid surgery/intervention. The conversation included: -Discussion of all treatment options including carotid endarterectomy (CEA), carotid artery stenting (which includes transcarotid artery revascularization (TCAR)), and optimal medical therapy (OMT). -Explanation of risks and benefits for each option specific to Jared Cline's clinical situation. -Integration of clinical guidelines as it relates to the patient's history and comorbidities. -Discussion and incorporation of Jared Cline and their personal preferences and priorities in choosing a treatment plan plan   If the patient was unable to participate in Shared Decision Making this process was done with the patient.   Patient's CT angiography of the carotid arteries confirms >90% right ICA stenosis.  The anatomical considerations support stenting over surgery.  This was discussed in detail with the patient.   The risks, benefits and alternative therapies were reviewed in detail with the patient.  All questions were answered.  The patient agrees to proceed with stenting of the right carotid artery.   The patient's NIHSS score is as follows: 6 Mild: 1 - 5 Mild to Moderately Severe: 5 - 14 Severe: 15 - 24 Very Severe: >25   Continue antiplatelet therapy as prescribed. Continue management of CAD, HTN and Hyperlipidemia. Healthy heart diet, encouraged exercise at least 4 times per week.     A total of 60 minutes was   spent with this patient and greater than 50% was spent in counseling and coordination of care with the patient.  Discussion included the  treatment options for vascular disease including indications for surgery and intervention.  Also discussed is the appropriate timing of treatment.  In addition medical therapy was discussed.   2. Cerebrovascular accident (CVA) due to stenosis of right carotid artery (HCC) Recommend:   The patient is symptomatic with respect to the carotid stenosis.  The patient now has progressed and has a lesion the is >90%.   I have completed Shared Decision-Making with Jared Milesprior to carotid surgery/intervention. The conversation included: -Discussion of all treatment options including carotid endarterectomy (CEA), carotid artery stenting (which includes transcarotid artery revascularization (TCAR)), and optimal medical therapy (OMT). -Explanation of risks and benefits for each option specific to Jared Cline's clinical situation. -Integration of clinical guidelines as it relates to the patient's history and comorbidities. -Discussion and incorporation of Jared Cline and their personal preferences and priorities in choosing a treatment plan plan   If the patient was unable to participate in Shared Decision Making this process was done with the patient.   Patient's CT angiography of the carotid arteries confirms >90% right ICA stenosis.  The anatomical considerations support stenting over surgery.  This was discussed in detail with the patient.   The risks, benefits and alternative therapies were reviewed in detail with the patient.  All questions were answered.  The patient agrees to proceed with stenting of the right carotid artery.   The patient's NIHSS score is as follows: 6 Mild: 1 - 5 Mild to Moderately Severe: 5 - 14 Severe: 15 - 24 Very Severe: >25   Continue antiplatelet therapy as prescribed. Continue management of CAD, HTN and Hyperlipidemia. Healthy heart diet, encouraged exercise at least 4 times per week.    3. Acute cerebrovascular accident of cerebellum (HCC) Recommend Once his  cervical carotid lesion has been treated I would like to have him evaluated by a neurointerventional for his intracranial lesions in particular the basilar artery stenosis that also appears to be symptomatic.  I will place a referral.   - Ambulatory referral to Neurosurgery   4. Aortic atherosclerosis (HCC)  Recommend:   The patient has evidence of atherosclerosis of the lower extremities with minimal symptoms at this time.  The patient does not voice lifestyle limiting changes at this point in time.   No invasive studies, angiography or surgery at this time The patient should continue walking and begin a more formal exercise program.  The patient should continue antiplatelet therapy and aggressive treatment of the lipid abnormalities   No changes in the patient's medications at this time   Continued surveillance is indicated as atherosclerosis is likely to progress with time.     The patient will continue follow up with me as ordered.    5. Primary hypertension Continue antihypertensive medications as already ordered, these medications have been reviewed and there are no changes at this time.   6. Mixed hyperlipidemia Continue statin as ordered and reviewed, no changes at this time   7. Coronary artery disease involving native coronary artery of native heart with angina pectoris (HCC) Continue cardiac and antihypertensive medications as already ordered and reviewed, no changes at this time.   Continue statin as ordered and reviewed, no changes at this time   Nitrates PRN for chest pain     Wei Poplaski, MD  11/26/2022 12:16 PM   

## 2022-11-26 NOTE — Progress Notes (Signed)
MRN : [id number]  Jared Cline is a 77 y.o. (June 27, 1946) male who presents with chief complaint of check carotid arteries.  History of Present Illness:   The patient is seen for evaluation of carotid stenosis. The carotid stenosis was identified after CTA dated 09/15/2022.  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   Greater than 90% stneosis of the Right ICA origin due to bulky calcified plaque.  The lesion is moderately calcified.  He does have bilateral retropharyngeal internal carotids.  The upper extent of his right common carotid lesion is at the the level of the C1-C2 disc space.  This is a very high lesion.   The patient denies amaurosis fugax. There is recent history of TIA symptoms with significant balance disturbances associated with weakness of his left side. There is a prior documented CVA.   There is no history of migraine headaches. There is no history of seizures.   The patient is taking Eliquis secondary to a history of PE.  He is not taking any antiplatelet medications at this time.   No recent shortening of the patient's walking distance or new symptoms consistent with claudication.  No history of rest pain symptoms. No new ulcers or wounds of the lower extremities have occurred.   There is no history of DVT, PE or superficial thrombophlebitis. No recent episodes of angina or shortness of breath documented.     Current Meds  Medication Sig   acetaminophen (TYLENOL) 650 MG CR tablet Take 2 tablets (1,300 mg total) by mouth every 12 (twelve) hours as needed (Mild pain).   apixaban (ELIQUIS) 5 MG TABS tablet Take 1 tablet (5 mg total) by mouth 2 (two) times  daily.   cholecalciferol (VITAMIN D3) 25 MCG (1000 UNIT) tablet Take 1,000 Units by mouth daily.   CINNAMON PO Take by mouth.   cloNIDine (CATAPRES) 0.3 MG tablet Take 1 tablet (0.3 mg total) by mouth 2 (two) times daily.   cyanocobalamin (VITAMIN B12) 1000 MCG tablet Take 1,000 mcg by mouth daily.   diclofenac Sodium (VOLTAREN) 1 % GEL Apply 2 g topically 4 (four) times daily.   GARLIC PO Take by mouth.   hydrALAZINE (APRESOLINE) 10 MG tablet TAKE 1 TABLET BY MOUTH TWICE DAILY AS NEEDED; TAKE IF BLOOD PRESSURE GREATER THAN 140/ 90   labetalol (NORMODYNE) 100 MG tablet    olmesartan (BENICAR) 20 MG tablet Take 20 mg by mouth daily.    Past Medical History:  Diagnosis Date   BPH (benign prostatic hypertrophy)    Bradycardia    evaluated by Dr. Gwen Pounds   Cancer    prostate   Carpal tunnel syndrome, left    left arm   CKD (chronic kidney disease) stage 2, GFR 60-89 ml/min    Diverticulitis    Gastritis 2017   History of prostate cancer    HNP (herniated nucleus pulposus), lumbar    Hyperlipidemia    Hypertension    IBS (irritable bowel syndrome)    Impotence    Multiple lung nodules    nonspecific, largest 5mm in Jan 2016; scanned March 2017; rescan 3-6 months later   OA (osteoarthritis) of knee    right   Renal  mass    evaluated; followed by urologist   Sleep apnea    on CPAP   Stroke    Torn meniscus    x 2; right knee    Past Surgical History:  Procedure Laterality Date   abdominal ultrasound  Oct 2015   Negative for eval for AAA   COLONOSCOPY     KNEE ARTHROSCOPY Right    x 2   LUMBAR LAMINECTOMY     PROSTATECTOMY     robotic    Social History Social History   Tobacco Use   Smoking status: Former    Packs/day: 0.50    Years: 9.00    Additional pack years: 0.00    Total pack years: 4.50    Types: Cigarettes    Quit date: 08/20/1971    Years since quitting: 51.3    Passive exposure: Past   Smokeless tobacco: Never  Vaping Use   Vaping Use: Never  used  Substance Use Topics   Alcohol use: No    Alcohol/week: 0.0 standard drinks of alcohol   Drug use: No    Family History Family History  Problem Relation Age of Onset   Stroke Mother    Hypertension Mother    Aneurysm Father        cerebral   Hypertension Father    Heart disease Paternal Uncle    Alcohol abuse Brother    Cancer Neg Hx    COPD Neg Hx    Diabetes Neg Hx     Allergies  Allergen Reactions   Flomax [Tamsulosin Hcl] Shortness Of Breath   Ace Inhibitors Other (See Comments)    Angioedema   Amlodipine Other (See Comments)    Angioedema   Doxazosin    Lipitor [Atorvastatin] Other (See Comments)    Memory loss     REVIEW OF SYSTEMS (Negative unless checked)  Constitutional: [] Weight loss  [] Fever  [] Chills Cardiac: [] Chest pain   [] Chest pressure   [] Palpitations   [] Shortness of breath when laying flat   [] Shortness of breath with exertion. Vascular:  [x] Pain in legs with walking   [] Pain in legs at rest  [] History of DVT   [] Phlebitis   [] Swelling in legs   [] Varicose veins   [] Non-healing ulcers Pulmonary:   [] Uses home oxygen   [] Productive cough   [] Hemoptysis   [] Wheeze  [] COPD   [] Asthma Neurologic:  [] Dizziness   [] Seizures   [] History of stroke   [] History of TIA  [] Aphasia   [] Vissual changes   [] Weakness or numbness in arm   [] Weakness or numbness in leg Musculoskeletal:   [] Joint swelling   [] Joint pain   [] Low back pain Hematologic:  [] Easy bruising  [] Easy bleeding   [] Hypercoagulable state   [] Anemic Gastrointestinal:  [] Diarrhea   [] Vomiting  [] Gastroesophageal reflux/heartburn   [] Difficulty swallowing. Genitourinary:  [] Chronic kidney disease   [] Difficult urination  [] Frequent urination   [] Blood in urine Skin:  [] Rashes   [] Ulcers  Psychological:  [] History of anxiety   []  History of major depression.  Physical Examination  Vitals:   11/26/22 0938 11/26/22 1214  BP: (!) 169/78 (!) 137/109  Pulse: 72   Resp: 17 15  Temp: 98.3 F  (36.8 C)   TempSrc: Oral   SpO2: 99% 100%  Weight: 100.7 kg   Height: 6' (1.829 m)    Body mass index is 30.11 kg/m. Gen: WD/WN, NAD Head: [place]/AT, No temporalis wasting.  Ear/Nose/Throat: Hearing grossly intact, nares w/o erythema or drainage Eyes:  PER, EOMI, sclera nonicteric.  Neck: Supple, no masses.  No bruit or JVD.  Pulmonary:  Good air movement, no audible wheezing, no use of accessory muscles.  Cardiac: RRR, normal S1, S2, no Murmurs. Vascular:  carotid bruit noted Vessel Right Left  Radial Palpable Palpable  Carotid  Palpable  Palpable  Subclav  Palpable Palpable  Gastrointestinal: soft, non-distended. No guarding/no peritoneal signs.  Musculoskeletal: M/S 5/5 throughout.  No visible deformity.  Neurologic: CN 2-12 intact. Pain and light touch intact in extremities.  Symmetrical.  Speech is fluent. Motor exam as listed above. Psychiatric: Judgment intact, Mood & affect appropriate for pt's clinical situation. Dermatologic: No rashes or ulcers noted.  No changes consistent with cellulitis.   CBC Lab Results  Component Value Date   WBC 4.8 09/15/2022   HGB 11.9 (L) 09/15/2022   HCT 36.7 (L) 09/15/2022   MCV 84.2 09/15/2022   PLT 320 09/15/2022    BMET    Component Value Date/Time   NA 132 (L) 09/15/2022 1006   NA 135 10/19/2015 1048   NA 133 (L) 08/25/2014 1020   K 3.7 09/15/2022 1006   K 4.1 08/25/2014 1020   CL 105 09/15/2022 1006   CL 100 08/25/2014 1020   CO2 24 09/15/2022 1006   CO2 27 08/25/2014 1020   GLUCOSE 97 09/15/2022 1006   GLUCOSE 95 08/25/2014 1020   BUN 19 11/26/2022 0955   BUN 12 10/19/2015 1048   BUN 13 08/25/2014 1020   CREATININE 1.32 (H) 11/26/2022 0955   CREATININE 1.55 (H) 02/20/2022 1439   CALCIUM 9.3 09/15/2022 1006   CALCIUM 9.1 08/25/2014 1020   GFRNONAA 55 (L) 11/26/2022 0955   GFRNONAA 49 (L) 07/21/2020 0930   GFRAA 57 (L) 07/21/2020 0930   Estimated Creatinine Clearance: 56.6 mL/min (A) (by C-G formula based on SCr of  1.32 mg/dL (H)).  COAG No results found for: "INR", "PROTIME"  Radiology No results found.   Assessment/Plan 1. Symptomatic carotid artery stenosis, unspecified laterality Recommend:   The patient is symptomatic with respect to the carotid stenosis.  The patient now has progressed and has a lesion the is >90%.   I have completed Shared Decision-Making with Jared Milesprior to carotid surgery/intervention. The conversation included: -Discussion of all treatment options including carotid endarterectomy (CEA), carotid artery stenting (which includes transcarotid artery revascularization (TCAR)), and optimal medical therapy (OMT). -Explanation of risks and benefits for each option specific to [facility] clinical situation. -Integration of clinical guidelines as it relates to the patient's history and comorbidities. -Discussion and incorporation of Jared Cline and their personal preferences and priorities in choosing a treatment plan plan   If the patient was unable to participate in Shared Decision Making this process was done with the patient.   Patient's CT angiography of the carotid arteries confirms >90% right ICA stenosis.  The anatomical considerations support stenting over surgery.  This was discussed in detail with the patient.   The risks, benefits and alternative therapies were reviewed in detail with the patient.  All questions were answered.  The patient agrees to proceed with stenting of the right carotid artery.   The patient's NIHSS score is as follows: 6 Mild: 1 - 5 Mild to Moderately Severe: 5 - 14 Severe: 15 - 24 Very Severe: >25   Continue antiplatelet therapy as prescribed. Continue management of CAD, HTN and Hyperlipidemia. Healthy heart diet, encouraged exercise at least 4 times per week.     A total of 60 minutes was  spent with this patient and greater than 50% was spent in counseling and coordination of care with the patient.  Discussion included the  treatment options for vascular disease including indications for surgery and intervention.  Also discussed is the appropriate timing of treatment.  In addition medical therapy was discussed.   2. Cerebrovascular accident (CVA) due to stenosis of right carotid artery (HCC) Recommend:   The patient is symptomatic with respect to the carotid stenosis.  The patient now has progressed and has a lesion the is >90%.   I have completed Shared Decision-Making with Jared Milesprior to carotid surgery/intervention. The conversation included: -Discussion of all treatment options including carotid endarterectomy (CEA), carotid artery stenting (which includes transcarotid artery revascularization (TCAR)), and optimal medical therapy (OMT). -Explanation of risks and benefits for each option specific to [facility] clinical situation. -Integration of clinical guidelines as it relates to the patient's history and comorbidities. -Discussion and incorporation of Jared Cline and their personal preferences and priorities in choosing a treatment plan plan   If the patient was unable to participate in Shared Decision Making this process was done with the patient.   Patient's CT angiography of the carotid arteries confirms >90% right ICA stenosis.  The anatomical considerations support stenting over surgery.  This was discussed in detail with the patient.   The risks, benefits and alternative therapies were reviewed in detail with the patient.  All questions were answered.  The patient agrees to proceed with stenting of the right carotid artery.   The patient's NIHSS score is as follows: 6 Mild: 1 - 5 Mild to Moderately Severe: 5 - 14 Severe: 15 - 24 Very Severe: >25   Continue antiplatelet therapy as prescribed. Continue management of CAD, HTN and Hyperlipidemia. Healthy heart diet, encouraged exercise at least 4 times per week.    3. Acute cerebrovascular accident of cerebellum (HCC) Recommend Once his  cervical carotid lesion has been treated I would like to have him evaluated by a neurointerventional for his intracranial lesions in particular the basilar artery stenosis that also appears to be symptomatic.  I will place a referral.   - Ambulatory referral to Neurosurgery   4. Aortic atherosclerosis (HCC)  Recommend:   The patient has evidence of atherosclerosis of the lower extremities with minimal symptoms at this time.  The patient does not voice lifestyle limiting changes at this point in time.   No invasive studies, angiography or surgery at this time The patient should continue walking and begin a more formal exercise program.  The patient should continue antiplatelet therapy and aggressive treatment of the lipid abnormalities   No changes in the patient's medications at this time   Continued surveillance is indicated as atherosclerosis is likely to progress with time.     The patient will continue follow up with me as ordered.    5. Primary hypertension Continue antihypertensive medications as already ordered, these medications have been reviewed and there are no changes at this time.   6. Mixed hyperlipidemia Continue statin as ordered and reviewed, no changes at this time   7. Coronary artery disease involving native coronary artery of native heart with angina pectoris (HCC) Continue cardiac and antihypertensive medications as already ordered and reviewed, no changes at this time.   Continue statin as ordered and reviewed, no changes at this time   Nitrates PRN for chest pain     Levora Dredge, MD  11/26/2022 12:16 PM

## 2022-11-26 NOTE — Progress Notes (Signed)
Informed Dr. Gilda Crease pt HR dips in upper 30s and 40s then back up to 50s and 60s.  BP in 130s.  Dr. Gilda Crease at bedside.

## 2022-11-26 NOTE — Interval H&P Note (Signed)
History and Physical Interval Note:  11/26/2022 12:17 PM  Jared Cline  has presented today for surgery, with the diagnosis of R Carotid Stent   ABBOTT   Carotid artery stenosis.  The various methods of treatment have been discussed with the patient and family. After consideration of risks, benefits and other options for treatment, the patient has consented to  Procedure(s): CAROTID PTA/STENT INTERVENTION (Right) as a surgical intervention.  The patient's history has been reviewed, patient examined, no change in status, stable for surgery.  I have reviewed the patient's chart and labs.  Questions were answered to the patient's satisfaction.     Levora Dredge

## 2022-11-26 NOTE — Op Note (Signed)
OPERATIVE NOTE DATE: 11/26/2022  PROCEDURE:  Ultrasound guidance for vascular access ablation of right femoral artery  Placement of a 9 x 7 x 40 exact stent with the use of the NAV-6 embolic protection device in the right internal carotid artery  PRE-OPERATIVE DIAGNOSIS: 1.  Greater than 95% stenosis of the right internal carotid artery.   POST-OPERATIVE DIAGNOSIS:  Same as above  SURGEON: Levora Dredge  ASSISTANT(S): None  ANESTHESIA: local/MCS  ESTIMATED BLOOD LOSS: 100 cc  CONTRAST: 60 cc  FLUORO TIME: 10.4 minutes  MODERATE CONSCIOUS SEDATION TIME: Continuous ECG pulse oximetry and cardiopulmonary monitoring was performed throughout the entire procedure by the interventional radiology nurse total sedation time was 51 minutes  FINDING(S): 1.   95% stenosis of the right internal carotid artery  SPECIMEN(S):   none  INDICATIONS:   Patient is a 77 y.o. male who presents with symptomatic right carotid artery stenosis.  The patient has multiple comorbidities including severe coronary artery disease as well as a very high bifurcation in association with a lesion that extends into the internal carotid artery for 20 to 30 mm.  Additionally the internal carotid artery is retropharyngeal.  Carotid artery stenting was felt to be preferred to endarterectomy for that reason acknowledging the degree of calcification present in his lesion but understanding the surgical limitations of his anatomy.  Risks and benefits were discussed and informed consent was obtained.   DESCRIPTION: After obtaining full informed written consent, the patient was brought back to the vascular suite and placed supine upon the table.  The patient received IV antibiotics prior to induction. Moderate conscious sedation was administered during a face to face encounter with the patient throughout the procedure with my supervision of the RN administering medicines and monitoring the patients vital signs and mental  status throughout from the start of the procedure until the patient was taken to the recovery room.    After obtaining adequate sedation, the patient was prepped and draped in the standard fashion.    A first assistant is required in order to allow for a safe and more efficient operation.  Duties include wire manipulations as well as assistance with pinning the sheath and positioning the detector for proper angle, assistance and deploying the stent in the proper position and appropriate images.  Further duties include assisting with patient positioning during the procedure.  I believe that this procedure requires a first assistant in order for it to be performed at a level in keeping with the high standards of this institution.  The right femoral artery was visualized with ultrasound and found to be widely patent. It was then accessed under direct ultrasound guidance without difficulty with a micropuncture needle. A permanent image was recorded.  A microwire was then advanced without difficulty under fluoroscopic guidance followed by a micro-sheath.  A J-wire was placed and we then placed a 6 French sheath. The patient was then heparinized and a total of 14,000 units of intravenous heparin were given and an ACT was checked to confirm successful anticoagulation.   A pigtail catheter was then placed into the ascending aorta. This showed type II arch. The innominate artery and subsequently the right common carotid artery was then selectively cannulated without difficulty with a H1 catheter and the catheter advanced into the mid right common carotid artery.  Cervical and cerebral carotid angiography was then performed. There were no obvious intracranial filling defects. The carotid bifurcation demonstrated a bulky densely calcified plaque.  I then advanced into  the external carotid artery with a Glidewire and the H1 catheter and then exchanged for the Amplatz Super Stiff wire. Over the Amplatz Super Stiff wire,  a 6 [place] shuttle sheath was placed into the mid common carotid artery. I then used the NAV-6  Embolic protection device and crossed the lesion and parked this in the distal internal carotid artery at the base of the skull.  Given the nature of the plaque I elected to predilate this lesion with the embolic protection in place a 4 mm x 30 mm balloon was advanced across the lesion without difficulty and inflated to 10 atm.  Subsequently, I then selected a 9 x 7 x 40 exact stent.  Initially the stent would not advance across the lesion it was then removed and a 5.0 mm x 30 mm balloon was advanced across the lesion and inflated to 10 atm.  The stent was then retried and this time passed without difficulty.  It was then deployed  across the lesion encompassing it in its entirety. A 5 mm x 30 mm length balloon was used to post dilate the stent. Only about a 35-40% residual stenosis was present after angioplasty. Completion angiogram showed normal intracranial filling without new defects. At this point I elected to terminate the procedure. The sheath was removed and StarClose closure device was deployed in the right femoral artery with excellent hemostatic result. The patient was taken to the recovery room in stable condition having tolerated the procedure well.  COMPLICATIONS: none  CONDITION: stable  Levora Dredge 11/26/2022 2:53 PM   This note was created with Dragon Medical transcription system. Any errors in dictation are purely unintentional.

## 2022-11-26 NOTE — Progress Notes (Signed)
Pt c/o lower abd pain PAD on right femoral artery site with 62ml air instilled.  Dr. Gilda Crease notified of above patient pain.  PAD 47ml released and in and out cath with returned pt. Expresses almost complete relief of pain at this time

## 2022-11-27 DIAGNOSIS — Z9889 Other specified postprocedural states: Secondary | ICD-10-CM

## 2022-11-27 DIAGNOSIS — Z95828 Presence of other vascular implants and grafts: Secondary | ICD-10-CM

## 2022-11-27 LAB — BASIC METABOLIC PANEL
Anion gap: 6 (ref 5–15)
BUN: 19 mg/dL (ref 8–23)
CO2: 24 mmol/L (ref 22–32)
Calcium: 9.1 mg/dL (ref 8.9–10.3)
Chloride: 106 mmol/L (ref 98–111)
Creatinine, Ser: 1.34 mg/dL — ABNORMAL HIGH (ref 0.61–1.24)
GFR, Estimated: 54 mL/min — ABNORMAL LOW (ref >60)
Glucose, Bld: 91 mg/dL (ref 70–99)
Potassium: 4.3 mmol/L (ref 3.5–5.1)
Sodium: 136 mmol/L (ref 135–145)

## 2022-11-27 LAB — CBC
HCT: 34.9 % — ABNORMAL LOW (ref 39.0–52.0)
Hemoglobin: 11.3 g/dL — ABNORMAL LOW (ref 13.0–17.0)
MCH: 28.3 pg (ref 26.0–34.0)
MCHC: 32.4 g/dL (ref 30.0–36.0)
MCV: 87.3 fL (ref 80.0–100.0)
Platelets: 277 10*3/uL (ref 150–400)
RBC: 4 MIL/uL — ABNORMAL LOW (ref 4.22–5.81)
RDW: 13 % (ref 11.5–15.5)
WBC: 6.3 10*3/uL (ref 4.0–10.5)
nRBC: 0 % (ref 0.0–0.2)

## 2022-11-27 MED ORDER — ASPIRIN 81 MG PO TBEC: 81.0000 mg | DELAYED_RELEASE_TABLET | Freq: Every day | ORAL | Status: DC

## 2022-11-27 MED ORDER — CLOPIDOGREL BISULFATE 75 MG PO TABS
ORAL_TABLET | ORAL | Status: AC
  Filled 2022-11-27: qty 1

## 2022-11-27 MED ORDER — CEFAZOLIN SODIUM-DEXTROSE 2-4 GM/100ML-% IV SOLN
INTRAVENOUS | Status: AC
  Filled 2022-11-27: qty 100

## 2022-11-27 MED ORDER — ASPIRIN 81 MG PO TBEC: 81.0000 mg | DELAYED_RELEASE_TABLET | Freq: Every day | ORAL | 11 refills | Status: AC

## 2022-11-27 MED ORDER — ASPIRIN EC 81 MG PO TBEC
81.0000 mg | DELAYED_RELEASE_TABLET | Freq: Every day | ORAL | Status: DC
  Administered 2022-11-27: 81 mg via ORAL
  Filled 2022-11-27 (×2): qty 1

## 2022-11-27 MED ORDER — DOCUSATE SODIUM 100 MG PO CAPS
ORAL_CAPSULE | ORAL | Status: AC
  Filled 2022-11-27: qty 1

## 2022-11-27 MED ORDER — APIXABAN 2.5 MG PO TABS
ORAL_TABLET | ORAL | Status: AC
  Filled 2022-11-27: qty 2

## 2022-11-27 NOTE — Discharge Summary (Signed)
[facility]    Discharge Summary    Patient ID:  Jared SailorsRalph Valade MRN: [id number] DOB/AGE: 78/01/1946 78 y.o.  Admit date: 11/26/2022 Discharge date: 11/27/2022 Date of Surgery: 11/26/2022 Surgeon: Surgeon(s): Schnier, Latina CraverGregory G, MD  Admission Diagnosis: Carotid artery stenosis, symptomatic, right [I65.21]  Discharge Diagnoses:  Carotid artery stenosis, symptomatic, right [I65.21]  Secondary Diagnoses: Past Medical History:  Diagnosis Date   BPH (benign prostatic hypertrophy)    Bradycardia    evaluated by Dr. Gwen PoundsKowalski   Cancer    prostate   Carpal tunnel syndrome, left    left arm   CKD (chronic kidney disease) stage 2, GFR 60-89 ml/min    Diverticulitis    Gastritis 2017   History of prostate cancer    HNP (herniated nucleus pulposus), lumbar    Hyperlipidemia    Hypertension    IBS (irritable bowel syndrome)    Impotence    Multiple lung nodules    nonspecific, largest 5mm in Jan 2016; scanned March 2017; rescan 3-6 months later   OA (osteoarthritis) of knee    right   Renal mass    evaluated; followed by urologist   Sleep apnea    on CPAP   Stroke    Torn meniscus    x 2; right knee    Procedure(s): CAROTID PTA/STENT INTERVENTION  Discharged Condition: good  HPI:  Jared Cline is a 77 year old male who is now postop day 1 from endovascular right carotid stent placement.  On exam this morning patient states that he is feeling fantastic.  He has no complaints from overnight.  Patient's vitals all remained stable.  Patient's heart rate at rest has been 55-60 and upon walking was 80-90.  Denies any chest pain or shortness of breath.  Patient's neuroexam is WNL and he is fully intact.  Patient follows commands appropriately.  Patient answers all questions appropriately.  Patient to be discharged home today on aspirin 81 mg daily, Plavix 75 mg daily, and Eliquis 5 mg twice daily.  Hospital Course:  Jared SailorsRalph Ivens is a 77 y.o. male is S/P  Right Extubated: POD # 0 Physical Exam:  Alert notes x3, no acute distress Face: Symmetrical.  Tongue is midline. Neck: Trachea is midline.  No swelling or bruising. Cardiovascular: Regular rate and rhythm Pulmonary: Clear to auscultation bilaterally Abdomen: Soft, nontender, nondistended Right groin access: Clean dry and intact.  No swelling or drainage noted Left lower extremity: Thigh soft.  Calf soft.  Extremities warm distally toes.  Hard to palpate pedal pulses however the foot is warm is her good capillary refill. Right lower extremity: Thigh soft.  Calf soft.  Extremities warm distally toes.  Hard to palpate pedal pulses however the foot is warm is her good capillary refill. Neurological: No deficits noted   Post-op wounds:  clean, dry, intact or healing well  Pt. Ambulating, voiding and taking PO diet without difficulty. Pt pain controlled with PO pain meds.  Labs:  As below  Complications: none  Consults:    Significant Diagnostic Studies: CBC Lab Results  Component Value Date   WBC 6.3 11/27/2022   HGB 11.3 (L) 11/27/2022   HCT 34.9 (L) 11/27/2022   MCV 87.3 11/27/2022   PLT 277 11/27/2022    BMET    Component Value Date/Time   NA 136 11/27/2022 0557   NA 135 10/19/2015 1048   NA 133 (L) 08/25/2014 1020   K 4.3 11/27/2022 0557   K 4.1 08/25/2014 1020  CL 106 11/27/2022 0557   CL 100 08/25/2014 1020   CO2 24 11/27/2022 0557   CO2 27 08/25/2014 1020   GLUCOSE 91 11/27/2022 0557   GLUCOSE 95 08/25/2014 1020   BUN 19 11/27/2022 0557   BUN 12 10/19/2015 1048   BUN 13 08/25/2014 1020   CREATININE 1.34 (H) 11/27/2022 0557   CREATININE 1.55 (H) 02/20/2022 1439   CALCIUM 9.1 11/27/2022 0557   CALCIUM 9.1 08/25/2014 1020   GFRNONAA 54 (L) 11/27/2022 0557   GFRNONAA 49 (L) 07/21/2020 0930   GFRAA 57 (L) 07/21/2020 0930   COAG No results found for: "INR", "PROTIME"   Disposition:  Discharge to :Home  Allergies as of 11/27/2022       Reactions    Flomax [tamsulosin Hcl] Shortness Of Breath   Ace Inhibitors Other (See Comments)   Angioedema   Amlodipine Other (See Comments)   Angioedema   Doxazosin    Lipitor [atorvastatin] Other (See Comments)   Memory loss        Medication List     TAKE these medications    acetaminophen 650 MG CR tablet Commonly known as: TYLENOL Take 2 tablets (1,300 mg total) by mouth every 12 (twelve) hours as needed (Mild pain).   apixaban 5 MG Tabs tablet Commonly known as: ELIQUIS Take 1 tablet (5 mg total) by mouth 2 (two) times daily.   aspirin EC 81 MG tablet Take 1 tablet (81 mg total) by mouth daily at 6 (six) AM. Swallow whole. Start taking on: November 28, 2022   cholecalciferol 25 MCG (1000 UNIT) tablet Commonly known as: VITAMIN D3 Take 1,000 Units by mouth daily.   CINNAMON PO Take by mouth.   cloNIDine 0.3 MG tablet Commonly known as: CATAPRES Take 1 tablet (0.3 mg total) by mouth 2 (two) times daily.   clopidogrel 75 MG tablet Commonly known as: Plavix Take 1 tablet (75 mg total) by mouth daily.   cyanocobalamin 1000 MCG tablet Commonly known as: VITAMIN B12 Take 1,000 mcg by mouth daily.   diclofenac Sodium 1 % Gel Commonly known as: VOLTAREN Apply 2 g topically 4 (four) times daily.   GARLIC PO Take by mouth.   hydrALAZINE 10 MG tablet Commonly known as: APRESOLINE TAKE 1 TABLET BY MOUTH TWICE DAILY AS NEEDED; TAKE IF BLOOD PRESSURE GREATER THAN 140/ 90   labetalol 100 MG tablet Commonly known as: NORMODYNE   olmesartan 20 MG tablet Commonly known as: BENICAR Take 20 mg by mouth daily.   oxyCODONE 5 MG immediate release tablet Commonly known as: Oxy IR/ROXICODONE Take 1 tablet (5 mg total) by mouth every 4 (four) hours as needed for severe pain.   polyethylene glycol powder 17 GM/SCOOP powder Commonly known as: GLYCOLAX/MIRALAX Take 1 capful (17 g) by mouth 2 (two) times daily.   pravastatin 80 MG tablet Commonly known as: PRAVACHOL Take 1  tablet (80 mg total) by mouth at bedtime.   scopolamine 1 MG/3DAYS Commonly known as: TRANSDERM-SCOP Place 1 patch (1.5 mg total) onto the skin every 3 (three) days.   Senexon-S 8.6-50 MG tablet Generic drug: senna-docusate Take 3 tablets by mouth daily at 6 (six) AM.   sodium chloride 1 g tablet Take 1 tablet (1 g total) by mouth 2 (two) times daily with a meal.   spironolactone 25 MG tablet Commonly known as: ALDACTONE TAKE 1 TABLET(25 MG) BY MOUTH DAILY   traZODone 50 MG tablet Commonly known as: DESYREL Take 0.5-1 tablets (25-50 mg total) by mouth  at bedtime as needed for sleep.   TURMERIC PO Take by mouth.   valsartan 160 MG tablet Commonly known as: DIOVAN       Verbal and written Discharge instructions given to the patient. Wound care per Discharge AVS  Follow-up Information     Schnier, Latina Craver, MD Follow up in 1 month(s).   Specialties: Vascular Surgery, Cardiology, Radiology, Vascular Surgery Why: F/U Dr Vilinda Flake with Bilateral Carotid Duplex Ultrasound. Contact information: [address] [place] [address] [phone]                 Signed: Marcie Bal, NP  11/27/2022, 10:12 AM

## 2022-11-27 NOTE — Progress Notes (Signed)
Pt ambulated around the unit, heart rate during ambulation in the 80-90 range without difficulty. Pt denies pain, DP/PT pulses in right foot palpable.

## 2022-11-28 ENCOUNTER — Ambulatory Visit (INDEPENDENT_AMBULATORY_CARE_PROVIDER_SITE_OTHER): Payer: Medicare Other | Admitting: Nurse Practitioner

## 2022-11-28 ENCOUNTER — Telehealth: Payer: Self-pay

## 2022-11-28 ENCOUNTER — Other Ambulatory Visit: Payer: Self-pay

## 2022-11-28 ENCOUNTER — Encounter: Payer: Self-pay | Admitting: Nurse Practitioner

## 2022-11-28 VITALS — BP 124/82 | HR 75 | Temp 98.3°F | Resp 16 | Ht 72.0 in | Wt 224.7 lb

## 2022-11-28 DIAGNOSIS — G5602 Carpal tunnel syndrome, left upper limb: Secondary | ICD-10-CM | POA: Diagnosis not present

## 2022-11-28 DIAGNOSIS — I739 Peripheral vascular disease, unspecified: Secondary | ICD-10-CM | POA: Diagnosis not present

## 2022-11-28 DIAGNOSIS — I6521 Occlusion and stenosis of right carotid artery: Secondary | ICD-10-CM

## 2022-11-28 DIAGNOSIS — H5509 Other forms of nystagmus: Secondary | ICD-10-CM | POA: Diagnosis not present

## 2022-11-28 DIAGNOSIS — I10 Essential (primary) hypertension: Secondary | ICD-10-CM | POA: Diagnosis not present

## 2022-11-28 DIAGNOSIS — E782 Mixed hyperlipidemia: Secondary | ICD-10-CM | POA: Diagnosis not present

## 2022-11-28 DIAGNOSIS — N2889 Other specified disorders of kidney and ureter: Secondary | ICD-10-CM | POA: Diagnosis not present

## 2022-11-28 DIAGNOSIS — M25461 Effusion, right knee: Secondary | ICD-10-CM | POA: Diagnosis not present

## 2022-11-28 DIAGNOSIS — N1831 Chronic kidney disease, stage 3a: Secondary | ICD-10-CM | POA: Diagnosis not present

## 2022-11-28 DIAGNOSIS — E785 Hyperlipidemia, unspecified: Secondary | ICD-10-CM | POA: Diagnosis not present

## 2022-11-28 DIAGNOSIS — M5136 Other intervertebral disc degeneration, lumbar region: Secondary | ICD-10-CM | POA: Diagnosis not present

## 2022-11-28 DIAGNOSIS — K5792 Diverticulitis of intestine, part unspecified, without perforation or abscess without bleeding: Secondary | ICD-10-CM | POA: Diagnosis not present

## 2022-11-28 DIAGNOSIS — M170 Bilateral primary osteoarthritis of knee: Secondary | ICD-10-CM | POA: Diagnosis not present

## 2022-11-28 DIAGNOSIS — N400 Enlarged prostate without lower urinary tract symptoms: Secondary | ICD-10-CM | POA: Diagnosis not present

## 2022-11-28 DIAGNOSIS — I69393 Ataxia following cerebral infarction: Secondary | ICD-10-CM | POA: Diagnosis not present

## 2022-11-28 DIAGNOSIS — Z8673 Personal history of transient ischemic attack (TIA), and cerebral infarction without residual deficits: Secondary | ICD-10-CM | POA: Diagnosis not present

## 2022-11-28 DIAGNOSIS — I2693 Single subsegmental pulmonary embolism without acute cor pulmonale: Secondary | ICD-10-CM | POA: Diagnosis not present

## 2022-11-28 DIAGNOSIS — G4733 Obstructive sleep apnea (adult) (pediatric): Secondary | ICD-10-CM | POA: Diagnosis not present

## 2022-11-28 DIAGNOSIS — K581 Irritable bowel syndrome with constipation: Secondary | ICD-10-CM | POA: Diagnosis not present

## 2022-11-28 DIAGNOSIS — I129 Hypertensive chronic kidney disease with stage 1 through stage 4 chronic kidney disease, or unspecified chronic kidney disease: Secondary | ICD-10-CM | POA: Diagnosis not present

## 2022-11-28 NOTE — Transitions of Care (Post Inpatient/ED Visit) (Signed)
   11/28/2022  Name: Jared Cline MRN: [id number] DOB: Dec 30, 1945  Today's TOC FU Call Status: Today's TOC FU Call Status:: Successful TOC FU Call Competed TOC FU Call Complete Date: 11/28/22  Transition Care Management Follow-up Telephone Call Date of Discharge: 11/27/22 Discharge Facility: [facility]) Type of Discharge: Inpatient Admission Primary Inpatient Discharge Diagnosis:: "stenosis of right carotid artery" How have you been since you were released from the hospital?: Better Any questions or concerns?: Yes Patient Questions/Concerns:: HHPT in the home during call-states he was just reviewing pt's meds with pt-has concerns about patient being on three blood thinners(ASA,Plavix & Eliquis),therapist reports pt does not have Benicar in the home and Labetalol on hold until seen by PCP Patient Questions/Concerns Addressed: Notified Provider of Patient Questions/Concerns, Other: (Discussed and reviewed d/c med instructions-pt is going for PCP appt today and  advised to take all pill bottles in the home to appt for further detailed review of meds)  Items Reviewed: Did you receive and understand the discharge instructions provided?: Yes Medications obtained and verified?: Yes (Medications Reviewed) Any new allergies since your discharge?: No Dietary orders reviewed?: Yes Type of Diet Ordered:: low salt/heart healthy Do you have support at home?: Yes People in Home: spouse Name of Support/Comfort Primary Source: [facility] and Equipment/Supplies: Were Home Health Services Ordered?: No (pt had [facility] in place prior to admission and services have resumed) Any new equipment or medical supplies ordered?: NA  Functional Questionnaire: Do you need assistance with bathing/showering or dressing?: No Do you need assistance with meal preparation?: No Do you need assistance with eating?: No Do you have difficulty maintaining continence: No Do you need  assistance with getting out of bed/getting out of a chair/moving?: No Do you have difficulty managing or taking your medications?: Yes  Follow up appointments reviewed: PCP Follow-up appointment confirmed?: Yes Date of PCP follow-up appointment?: 11/28/22 Follow-up Provider: Raynelle Fanning [facility] Follow-up appointment confirmed?: Yes Date of Specialist follow-up appointment?: 12/30/22 (pt states he is followed by Arther Abbott for cardiology-will call office and alert them of hospitalization and make an appt) Follow-Up Specialty Provider:: Dr. Gilda Crease Do you need transportation to your follow-up appointment?: No Do you understand care options if your condition(s) worsen?: Yes-patient verbalized understanding  SDOH Interventions Today    Flowsheet Row Most Recent Value  SDOH Interventions   Food Insecurity Interventions Intervention Not Indicated  Transportation Interventions Intervention Not Indicated      TOC Interventions Today    Flowsheet Row Most Recent Value  TOC Interventions   TOC Interventions Discussed/Reviewed TOC Interventions Discussed, Contacted provider for patient needs, S/S of infection, Post op wound/incision care      Interventions Today    Flowsheet Row Most Recent Value  General Interventions   General Interventions Discussed/Reviewed General Interventions Discussed, Doctor Visits  Doctor Visits Discussed/Reviewed PCP, Doctor Visits Discussed, Specialist  PCP/Specialist Visits Compliance with follow-up visit  Education Interventions   Education Provided Provided Education  Provided Verbal Education On Nutrition, When to see the doctor, Medication, Other  Nutrition Interventions   Nutrition Discussed/Reviewed Nutrition Discussed, Adding fruits and vegetables, Decreasing salt  Pharmacy Interventions   Pharmacy Dicussed/Reviewed Pharmacy Topics Discussed, Medications and their functions  Safety Interventions   Safety  Discussed/Reviewed Safety Discussed, Home Safety  [pt active with HHPT]       Alessandra Grout [facility] Care Management Care Management Community Coordinator Direct Phone: [phone] Toll Free: [phone] Fax: [phone]

## 2022-11-28 NOTE — Assessment & Plan Note (Signed)
Currently taking pravastatin 80 mg daily

## 2022-11-28 NOTE — Assessment & Plan Note (Signed)
Currently taking Eliquis 5 mg 2 times a day, Plavix 75 mg daily and aspirin 81 mg daily.

## 2022-11-28 NOTE — Assessment & Plan Note (Signed)
Currently taking Eliquis 5 mg 2 times a day, Plavix 75 mg daily and aspirin 81 mg daily. 

## 2022-11-28 NOTE — Progress Notes (Signed)
BP 124/82   Pulse 75   Temp 98.3 F (36.8 C) (Oral)   Resp 16   Ht 6' (1.829 m)   Wt 224 lb 11.2 oz (101.9 kg)   SpO2 98%   BMI 30.47 kg/m    Subjective:    Patient ID: Jared Cline, male    DOB: 1945/12/30, 77 y.o.   MRN: [id number]  HPI: Jared Cline is a 77 y.o. male  Chief Complaint  Patient presents with   Follow-up   Hospital follow up/stenosis right carotid artery/history of CVA: patient was admitted on 11/26/2022  and discharged on 11/27/2022. He had a endovascular right carotid stent placed.  He was discharged on aspirin 81 mg daily, plavix 75 mg daily and eliquis 5 mg two times a day.  He is to follow up with vascular surgery in one month, 12/30/2022.   Tandem lesions in the Basilar artery with cerebellar CVA, vascular placed referral to neurosurgery.    CT angio head and neck: 1. Scanner malfunction resulting in delayed intracranial CTA acquisition, subsequently poor intracranial bolus timing. Neck CTA images are diagnostic and include through the mid ICA siphons and the mid Basilar artery.   2. 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   - RADIOGRAPHIC STRING SIGN STENOSIS of the Right ICA origin due to bulky calcified plaque.   - contralateral estimated 60% Left ICA bulb stenosis, and moderate to severe stenosis of the supraclinoid Left ICA due to bulky calcified plaque.   - conspicuous low-density soft plaque in the Left CCA not resulting in stenosis.   3. Stable CT appearance of the brain since 08/24/2022. No acute intracranial abnormality.   4.  Aortic Atherosclerosis (ICD10-I70.0).  Med reconciliation:   Medication list did not match up with the cardiology med list.  Micah Flesher Over patient's medications he is taking versus cardiology note and pcp note.  Patient last updated  copy sent to cardiology office.  HTN: his blood pressure today is 124/82.  Patient currently taking valsartan 160 mg daily, labetalol 100 mg 2 times a day, clonidine 0.2 mg 2 times a day and hydralazine 10 mg 2 times a day as needed for blood pressures greater than 140/90.   Hyperlipidemia: Patient currently taking pravastatin 80 mg daily.  History of CVA/stenosis of right carotid: Had stent placed currently taking aspirin 81 mg daily, Eliquis 5 mg 2 times a day and Plavix 75 mg daily.  Relevant past medical, surgical, family and social history reviewed and updated as indicated. Interim medical history since our last visit reviewed. Allergies and medications reviewed and updated.  Review of Systems  Constitutional: Negative for fever or weight change.  Respiratory: Negative for cough and shortness of breath.   Cardiovascular: Negative for chest pain or palpitations.  Gastrointestinal: Negative for abdominal pain, no bowel changes.  Musculoskeletal: Negative for gait problem or joint swelling.  Skin: Negative for rash.  Neurological: Negative for dizziness or headache.  No other specific complaints in a complete review of systems (except as listed in HPI above).      Objective:    BP 124/82   Pulse 75   Temp 98.3 F (36.8 C) (Oral)   Resp 16   Ht 6' (1.829 m)   Wt 224 lb 11.2 oz (101.9 kg)   SpO2 98%   BMI 30.47 kg/m   Wt Readings  from Last 3 Encounters:  11/28/22 224 lb 11.2 oz (101.9 kg)  11/26/22 222 lb (100.7 kg)  10/31/22 222 lb (100.7 kg)    Physical Exam  Constitutional: Patient appears well-developed and well-nourished. Obese  No distress.  HEENT: head atraumatic, normocephalic, pupils equal and reactive to light, neck supple Cardiovascular: Normal rate, regular rhythm and normal heart sounds.  No murmur heard. No BLE edema. Pulmonary/Chest: Effort normal and breath sounds normal. No respiratory distress. Abdominal: Soft.  There is no tenderness. Psychiatric:  Patient has a normal mood and affect. behavior is normal. Judgment and thought content normal.  Results for orders placed or performed during the hospital encounter of 11/26/22  BUN  Result Value Ref Range   BUN 19 8 - 23 mg/dL  Creatinine, serum  Result Value Ref Range   Creatinine, Ser 1.32 (H) 0.61 - 1.24 mg/dL   GFR, Estimated 55 (L) >60 mL/min  CBC  Result Value Ref Range   WBC 6.3 4.0 - 10.5 K/uL   RBC 4.00 (L) 4.22 - 5.81 MIL/uL   Hemoglobin 11.3 (L) 13.0 - 17.0 g/dL   HCT 16.134.9 (L) 09.639.0 - 04.552.0 %   MCV 87.3 80.0 - 100.0 fL   MCH 28.3 26.0 - 34.0 pg   MCHC 32.4 30.0 - 36.0 g/dL   RDW 40.913.0 81.111.5 - 91.415.5 %   Platelets 277 150 - 400 K/uL   nRBC 0.0 0.0 - 0.2 %  Basic metabolic panel  Result Value Ref Range   Sodium 136 135 - 145 mmol/L   Potassium 4.3 3.5 - 5.1 mmol/L   Chloride 106 98 - 111 mmol/L   CO2 24 22 - 32 mmol/L   Glucose, Bld 91 70 - 99 mg/dL   BUN 19 8 - 23 mg/dL   Creatinine, Ser 7.821.34 (H) 0.61 - 1.24 mg/dL   Calcium 9.1 8.9 - 95.610.3 mg/dL   GFR, Estimated 54 (L) >60 mL/min   Anion gap 6 5 - 15  POCT Activated clotting time  Result Value Ref Range   Activated Clotting Time 244 seconds      Assessment & Plan:   Problem List Items Addressed This Visit       Cardiovascular and Mediastinum   Hypertension (Chronic)    Is currently taking valsartan 160 mg daily, labetalol 100 mg 2 times a day, clonidine 0.2 mg 2 times a day and hydralazine 10 mg 2 times a day as needed for blood pressures greater than 140/90      Relevant Medications   cloNIDine (CATAPRES) 0.2 MG tablet   Carotid artery stenosis, symptomatic, right - Primary    Currently taking Eliquis 5 mg 2 times a day, Plavix 75 mg daily and aspirin 81 mg daily.      Relevant Medications   cloNIDine (CATAPRES) 0.2 MG tablet     Other   Hyperlipidemia (Chronic)    Currently taking pravastatin 80 mg daily      Relevant Medications   cloNIDine (CATAPRES) 0.2 MG tablet   History of CVA  (cerebrovascular accident)    Currently taking Eliquis 5 mg 2 times a day, Plavix 75 mg daily and aspirin 81 mg daily.       Updated med list faxed to cardiology office and patient provided list Follow up plan: Return in about 4 weeks (around 12/26/2022) for follow up.

## 2022-11-28 NOTE — Assessment & Plan Note (Signed)
Is currently taking valsartan 160 mg daily, labetalol 100 mg 2 times a day, clonidine 0.2 mg 2 times a day and hydralazine 10 mg 2 times a day as needed for blood pressures greater than 140/90

## 2022-11-29 DIAGNOSIS — G629 Polyneuropathy, unspecified: Secondary | ICD-10-CM | POA: Diagnosis not present

## 2022-11-29 DIAGNOSIS — I10 Essential (primary) hypertension: Secondary | ICD-10-CM | POA: Diagnosis not present

## 2022-11-29 DIAGNOSIS — G5603 Carpal tunnel syndrome, bilateral upper limbs: Secondary | ICD-10-CM | POA: Diagnosis not present

## 2022-11-29 DIAGNOSIS — I639 Cerebral infarction, unspecified: Secondary | ICD-10-CM | POA: Diagnosis not present

## 2022-12-02 DIAGNOSIS — M170 Bilateral primary osteoarthritis of knee: Secondary | ICD-10-CM | POA: Diagnosis not present

## 2022-12-02 DIAGNOSIS — I129 Hypertensive chronic kidney disease with stage 1 through stage 4 chronic kidney disease, or unspecified chronic kidney disease: Secondary | ICD-10-CM | POA: Diagnosis not present

## 2022-12-02 DIAGNOSIS — I739 Peripheral vascular disease, unspecified: Secondary | ICD-10-CM | POA: Diagnosis not present

## 2022-12-02 DIAGNOSIS — G4733 Obstructive sleep apnea (adult) (pediatric): Secondary | ICD-10-CM | POA: Diagnosis not present

## 2022-12-02 DIAGNOSIS — I2693 Single subsegmental pulmonary embolism without acute cor pulmonale: Secondary | ICD-10-CM | POA: Diagnosis not present

## 2022-12-02 DIAGNOSIS — E785 Hyperlipidemia, unspecified: Secondary | ICD-10-CM | POA: Diagnosis not present

## 2022-12-02 DIAGNOSIS — G5602 Carpal tunnel syndrome, left upper limb: Secondary | ICD-10-CM | POA: Diagnosis not present

## 2022-12-02 DIAGNOSIS — M5136 Other intervertebral disc degeneration, lumbar region: Secondary | ICD-10-CM | POA: Diagnosis not present

## 2022-12-02 DIAGNOSIS — K581 Irritable bowel syndrome with constipation: Secondary | ICD-10-CM | POA: Diagnosis not present

## 2022-12-02 DIAGNOSIS — N1831 Chronic kidney disease, stage 3a: Secondary | ICD-10-CM | POA: Diagnosis not present

## 2022-12-02 DIAGNOSIS — M25461 Effusion, right knee: Secondary | ICD-10-CM | POA: Diagnosis not present

## 2022-12-02 DIAGNOSIS — N400 Enlarged prostate without lower urinary tract symptoms: Secondary | ICD-10-CM | POA: Diagnosis not present

## 2022-12-02 DIAGNOSIS — N2889 Other specified disorders of kidney and ureter: Secondary | ICD-10-CM | POA: Diagnosis not present

## 2022-12-02 DIAGNOSIS — H5509 Other forms of nystagmus: Secondary | ICD-10-CM | POA: Diagnosis not present

## 2022-12-02 DIAGNOSIS — K5792 Diverticulitis of intestine, part unspecified, without perforation or abscess without bleeding: Secondary | ICD-10-CM | POA: Diagnosis not present

## 2022-12-02 DIAGNOSIS — I69393 Ataxia following cerebral infarction: Secondary | ICD-10-CM | POA: Diagnosis not present

## 2022-12-05 ENCOUNTER — Encounter: Payer: Self-pay | Admitting: Nurse Practitioner

## 2022-12-05 ENCOUNTER — Emergency Department: Payer: Medicare Other

## 2022-12-05 ENCOUNTER — Inpatient Hospital Stay: Payer: Medicare Other

## 2022-12-05 ENCOUNTER — Ambulatory Visit (INDEPENDENT_AMBULATORY_CARE_PROVIDER_SITE_OTHER): Payer: Medicare Other | Admitting: Nurse Practitioner

## 2022-12-05 ENCOUNTER — Other Ambulatory Visit: Payer: Self-pay

## 2022-12-05 ENCOUNTER — Ambulatory Visit: Payer: Self-pay | Admitting: *Deleted

## 2022-12-05 ENCOUNTER — Inpatient Hospital Stay
Admission: EM | Admit: 2022-12-05 | Discharge: 2022-12-07 | DRG: 065 | Disposition: A | Discharge status: Home Health | Payer: Medicare Other | Attending: Internal Medicine | Admitting: Internal Medicine

## 2022-12-05 VITALS — BP 172/62 | HR 84 | Temp 97.8°F | Resp 18 | Ht 72.0 in | Wt 225.2 lb

## 2022-12-05 DIAGNOSIS — N182 Chronic kidney disease, stage 2 (mild): Secondary | ICD-10-CM | POA: Insufficient documentation

## 2022-12-05 DIAGNOSIS — I739 Peripheral vascular disease, unspecified: Secondary | ICD-10-CM | POA: Diagnosis not present

## 2022-12-05 DIAGNOSIS — R42 Dizziness and giddiness: Secondary | ICD-10-CM | POA: Diagnosis not present

## 2022-12-05 DIAGNOSIS — Z8673 Personal history of transient ischemic attack (TIA), and cerebral infarction without residual deficits: Secondary | ICD-10-CM

## 2022-12-05 DIAGNOSIS — R079 Chest pain, unspecified: Secondary | ICD-10-CM | POA: Diagnosis not present

## 2022-12-05 DIAGNOSIS — Z811 Family history of alcohol abuse and dependence: Secondary | ICD-10-CM | POA: Diagnosis not present

## 2022-12-05 DIAGNOSIS — Z8546 Personal history of malignant neoplasm of prostate: Secondary | ICD-10-CM

## 2022-12-05 DIAGNOSIS — R519 Headache, unspecified: Secondary | ICD-10-CM

## 2022-12-05 DIAGNOSIS — Z95820 Peripheral vascular angioplasty status with implants and grafts: Secondary | ICD-10-CM

## 2022-12-05 DIAGNOSIS — N400 Enlarged prostate without lower urinary tract symptoms: Secondary | ICD-10-CM | POA: Diagnosis present

## 2022-12-05 DIAGNOSIS — R296 Repeated falls: Secondary | ICD-10-CM | POA: Diagnosis not present

## 2022-12-05 DIAGNOSIS — G4733 Obstructive sleep apnea (adult) (pediatric): Secondary | ICD-10-CM

## 2022-12-05 DIAGNOSIS — E871 Hypo-osmolality and hyponatremia: Secondary | ICD-10-CM | POA: Diagnosis present

## 2022-12-05 DIAGNOSIS — E669 Obesity, unspecified: Secondary | ICD-10-CM | POA: Diagnosis not present

## 2022-12-05 DIAGNOSIS — Z7902 Long term (current) use of antithrombotics/antiplatelets: Secondary | ICD-10-CM

## 2022-12-05 DIAGNOSIS — I6389 Other cerebral infarction: Secondary | ICD-10-CM | POA: Diagnosis not present

## 2022-12-05 DIAGNOSIS — Z888 Allergy status to other drugs, medicaments and biological substances status: Secondary | ICD-10-CM | POA: Diagnosis not present

## 2022-12-05 DIAGNOSIS — I10 Essential (primary) hypertension: Secondary | ICD-10-CM | POA: Diagnosis not present

## 2022-12-05 DIAGNOSIS — Z79899 Other long term (current) drug therapy: Secondary | ICD-10-CM

## 2022-12-05 DIAGNOSIS — R29702 NIHSS score 2: Secondary | ICD-10-CM | POA: Diagnosis present

## 2022-12-05 DIAGNOSIS — Z8249 Family history of ischemic heart disease and other diseases of the circulatory system: Secondary | ICD-10-CM

## 2022-12-05 DIAGNOSIS — R109 Unspecified abdominal pain: Secondary | ICD-10-CM | POA: Diagnosis not present

## 2022-12-05 DIAGNOSIS — I639 Cerebral infarction, unspecified: Secondary | ICD-10-CM | POA: Diagnosis not present

## 2022-12-05 DIAGNOSIS — R600 Localized edema: Secondary | ICD-10-CM | POA: Diagnosis present

## 2022-12-05 DIAGNOSIS — R55 Syncope and collapse: Secondary | ICD-10-CM | POA: Diagnosis not present

## 2022-12-05 DIAGNOSIS — I2510 Atherosclerotic heart disease of native coronary artery without angina pectoris: Secondary | ICD-10-CM | POA: Diagnosis present

## 2022-12-05 DIAGNOSIS — M1711 Unilateral primary osteoarthritis, right knee: Secondary | ICD-10-CM | POA: Diagnosis present

## 2022-12-05 DIAGNOSIS — R531 Weakness: Secondary | ICD-10-CM | POA: Diagnosis not present

## 2022-12-05 DIAGNOSIS — I129 Hypertensive chronic kidney disease with stage 1 through stage 4 chronic kidney disease, or unspecified chronic kidney disease: Secondary | ICD-10-CM | POA: Diagnosis present

## 2022-12-05 DIAGNOSIS — M6281 Muscle weakness (generalized): Secondary | ICD-10-CM | POA: Diagnosis not present

## 2022-12-05 DIAGNOSIS — Z87891 Personal history of nicotine dependence: Secondary | ICD-10-CM | POA: Diagnosis not present

## 2022-12-05 DIAGNOSIS — Z7982 Long term (current) use of aspirin: Secondary | ICD-10-CM | POA: Diagnosis not present

## 2022-12-05 DIAGNOSIS — E785 Hyperlipidemia, unspecified: Secondary | ICD-10-CM | POA: Diagnosis not present

## 2022-12-05 DIAGNOSIS — Z7901 Long term (current) use of anticoagulants: Secondary | ICD-10-CM | POA: Diagnosis not present

## 2022-12-05 DIAGNOSIS — N1831 Chronic kidney disease, stage 3a: Secondary | ICD-10-CM | POA: Diagnosis present

## 2022-12-05 DIAGNOSIS — I5031 Acute diastolic (congestive) heart failure: Secondary | ICD-10-CM | POA: Diagnosis not present

## 2022-12-05 LAB — TROPONIN I (HIGH SENSITIVITY)
Troponin I (High Sensitivity): 6 ng/L (ref <18)
Troponin I (High Sensitivity): 5 ng/L (ref <18)

## 2022-12-05 LAB — URINALYSIS, ROUTINE W REFLEX MICROSCOPIC
Bilirubin Urine: NEGATIVE
Glucose, UA: NEGATIVE mg/dL
Hgb urine dipstick: NEGATIVE
Ketones, ur: NEGATIVE mg/dL
Leukocytes,Ua: NEGATIVE
Nitrite: NEGATIVE
Protein, ur: NEGATIVE mg/dL
Specific Gravity, Urine: 1.018 (ref 1.005–1.030)
pH: 5 (ref 5.0–8.0)

## 2022-12-05 LAB — BASIC METABOLIC PANEL
Anion gap: 7 (ref 5–15)
BUN: 17 mg/dL (ref 8–23)
CO2: 23 mmol/L (ref 22–32)
Calcium: 9.2 mg/dL (ref 8.9–10.3)
Chloride: 104 mmol/L (ref 98–111)
Creatinine, Ser: 1.16 mg/dL (ref 0.61–1.24)
GFR, Estimated: >60 mL/min (ref >60)
Glucose, Bld: 95 mg/dL (ref 70–99)
Potassium: 3.9 mmol/L (ref 3.5–5.1)
Sodium: 134 mmol/L — ABNORMAL LOW (ref 135–145)

## 2022-12-05 LAB — CBC
HCT: 33.8 % — ABNORMAL LOW (ref 39.0–52.0)
Hemoglobin: 10.9 g/dL — ABNORMAL LOW (ref 13.0–17.0)
MCH: 28.2 pg (ref 26.0–34.0)
MCHC: 32.2 g/dL (ref 30.0–36.0)
MCV: 87.6 fL (ref 80.0–100.0)
Platelets: 308 10*3/uL (ref 150–400)
RBC: 3.86 MIL/uL — ABNORMAL LOW (ref 4.22–5.81)
RDW: 13 % (ref 11.5–15.5)
WBC: 6.6 10*3/uL (ref 4.0–10.5)
nRBC: 0 % (ref 0.0–0.2)

## 2022-12-05 MED ORDER — ACETAMINOPHEN 325 MG PO TABS: 650.0000 mg | ORAL_TABLET | ORAL | Status: DC | PRN

## 2022-12-05 MED ORDER — APIXABAN 5 MG PO TABS
5.0000 mg | ORAL_TABLET | Freq: Two times a day (BID) | ORAL | Status: DC
  Administered 2022-12-05 – 2022-12-07 (×4): 5 mg via ORAL
  Filled 2022-12-05 (×4): qty 1

## 2022-12-05 MED ORDER — SENNOSIDES-DOCUSATE SODIUM 8.6-50 MG PO TABS: 1.0000 | ORAL_TABLET | Freq: Every evening | ORAL | Status: DC | PRN

## 2022-12-05 MED ORDER — STROKE: EARLY STAGES OF RECOVERY BOOK: Freq: Once | Status: DC

## 2022-12-05 MED ORDER — ACETAMINOPHEN 160 MG/5ML PO SOLN: 650.0000 mg | ORAL | Status: DC | PRN

## 2022-12-05 MED ORDER — ACETAMINOPHEN 650 MG RE SUPP: 650.0000 mg | RECTAL | Status: DC | PRN

## 2022-12-05 MED ORDER — MORPHINE SULFATE (PF) 2 MG/ML IV SOLN
2.0000 mg | INTRAVENOUS | Status: DC | PRN
  Administered 2022-12-06 (×2): 2 mg via INTRAVENOUS
  Filled 2022-12-05 (×2): qty 1

## 2022-12-05 MED ORDER — SODIUM CHLORIDE 0.9 % IV SOLN: INTRAVENOUS | Status: DC

## 2022-12-05 MED ORDER — VITAMIN D 25 MCG (1000 UNIT) PO TABS
1000.0000 [IU] | ORAL_TABLET | Freq: Every day | ORAL | Status: DC
  Administered 2022-12-06 – 2022-12-07 (×2): 1000 [IU] via ORAL
  Filled 2022-12-05 (×2): qty 1

## 2022-12-05 MED ORDER — CLOPIDOGREL BISULFATE 75 MG PO TABS
75.0000 mg | ORAL_TABLET | Freq: Every day | ORAL | Status: DC
  Administered 2022-12-06 – 2022-12-07 (×2): 75 mg via ORAL
  Filled 2022-12-05 (×2): qty 1

## 2022-12-05 NOTE — ED Provider Notes (Signed)
[facility] Provider Note    Event Date/Time   First MD Initiated Contact with Patient 12/05/22 [phone]     (approximate)   History   Dizziness and Weakness   HPI  Jared Cline is a 77 y.o. male  who presents to the emergency department today from primary care office because of concern for possible stroke. The patient states that when he woke up he felt dizzy and weak. Tried using a scopolamine patch without any significant improvement. Does have history of stroke. Feels both lightheaded and that the room is spinning. Denies any chest pain or palpitations. Denies any recent illness.        Physical Exam   Triage Vital Signs: ED Triage Vitals  Enc Vitals Group     BP 12/05/22 1407 (!) 158/101     Pulse Rate 12/05/22 1407 63     Resp 12/05/22 1405 18     Temp 12/05/22 1407 97.7 F (36.5 C)     Temp src --      SpO2 12/05/22 1405 98 %     Weight --      Height --      Head Circumference --      Peak Flow --      Pain Score 12/05/22 1405 0     Pain Loc --      Pain Edu? --      Excl. in GC? --     Most recent vital signs: Vitals:   12/05/22 1405 12/05/22 1407  BP:  (!) 158/101  Pulse:  63  Resp: 18   Temp:  97.7 F (36.5 C)  SpO2: 98% (!) 9%   General: Awake, alert, oriented. CV:  Good peripheral perfusion. Bradycardia. Resp:  Normal effort. Lungs clear. Abd:  No distention.    ED Results / Procedures / Treatments   Labs (all labs ordered are listed, but only abnormal results are displayed) Labs Reviewed  BASIC METABOLIC PANEL - Abnormal; Notable for the following components:      Result Value   Sodium 134 (*)    All other components within normal limits  CBC - Abnormal; Notable for the following components:   RBC 3.86 (*)    Hemoglobin 10.9 (*)    HCT 33.8 (*)    All other components within normal limits  TROPONIN I (HIGH SENSITIVITY)  TROPONIN I (HIGH SENSITIVITY)     EKG  I, Phineas Semen, attending physician,  personally viewed and interpreted this EKG  EKG Time: 1409 Rate: 49 Rhythm: sinus bradycardia Axis: left axis deviation Intervals: qtc 368 QRS: narrow ST changes: no st elevation Impression: abnormal ekg    RADIOLOGY I independently interpreted and visualized the CT head. My interpretation: No bleed Radiology interpretation:  IMPRESSION:  1. No acute intracranial abnormality.  2. Unchanged mild chronic small-vessel disease.    I independently interpreted and visualized the CXR. My interpretation: No pneumonia Radiology interpretation:  IMPRESSION:  No radiographic finding to explain chest pain     MR brain IMPRESSION:  Punctate acute infarcts in the anterior right frontal lobe  subcortical white matter.   I, Phineas Semen, personally discussed these images (MR brain) and results by phone with the on-call radiologist and used this discussion as part of my medical decision making.      PROCEDURES:  Critical Care performed: No    MEDICATIONS ORDERED IN ED: Medications - No data to display   IMPRESSION / MDM / ASSESSMENT AND PLAN /  ED COURSE  I reviewed the triage vital signs and the nursing notes.                              Differential diagnosis includes, but is not limited to, CVA, medication side effect, arrhythmia, anemia, electrolyte abnormality.  Patient's presentation is most consistent with acute presentation with potential threat to life or bodily function.   The patient is on the cardiac monitor to evaluate for evidence of arrhythmia and/or significant heart rate changes.  Patient presented to the emergency department today from primary care doctor's office because of concerns for dizziness.  Patient does have history of stroke.  Blood work without concerning anemia or electrolyte abnormality.  CT did not show any acute abnormalities.  However given history of CVA in the past did obtain an MRI.  This did show acute CVA.  Discussed this finding  with the patient.  Discussed with Dr. Mikeal Hawthorne with the hospitalist service who will plan on admission.      FINAL CLINICAL IMPRESSION(S) / ED DIAGNOSES   Final diagnoses:  Dizziness  Cerebrovascular accident (CVA), unspecified mechanism    Note:  This document was prepared using Dragon voice recognition software and may include unintentional dictation errors.    Phineas Semen, MD 12/05/22 2001

## 2022-12-05 NOTE — Assessment & Plan Note (Signed)
Sent to er for eval

## 2022-12-05 NOTE — H&P (Signed)
History and Physical    Patient: Jared Cline LKG:[id number] DOB: 11-03-1945 DOA: 12/05/2022 DOS: the patient was seen and examined on 12/05/2022 PCP: Berniece Salines, FNP  Patient coming from: Home  Chief Complaint:  Chief Complaint  Patient presents with   Dizziness   Weakness   HPI: Averill Pons is a 77 y.o. male with medical history significant of recurrent CVA with his first CVA in January of this year, BPH, chronic kidney disease stage II, herniated lumbar disc, essential hypertension, hyperlipidemia, history of prostate cancer, obstructive sleep apnea and symptomatic bradycardia who presents to the ER with dizziness and weakness.  Symptoms apparently started today.  Patient had recent right carotid intervention for carotid artery stenosis which is symptomatic.  It was done by vascular surgery Dr. Gilda Crease.  Today however he started having the symptoms and came to the ER where he was seen and evaluated.  At this point patient appears to have new CVA.  He is therefore being admitted to the hospital for further evaluation and treatment.  His dizziness is waxing and waning.  It started this morning after he woke up.  He tried using scopolamine patch but no improvement.  The room was spinning around him.  His MRI showed punctate acute infarcts in the anterior right frontal lobe subcortical white matter.  Patient also complained of increasing lower extremity edema and abdominal swelling which she was not there in January prior to his stroke.  Review of Systems: As mentioned in the history of present illness. All other systems reviewed and are negative. Past Medical History:  Diagnosis Date   BPH (benign prostatic hypertrophy)    Bradycardia    evaluated by Dr. Gwen Pounds   Cancer    prostate   Carpal tunnel syndrome, left    left arm   CKD (chronic kidney disease) stage 2, GFR 60-89 ml/min    Diverticulitis    Gastritis 2017   History of prostate cancer    HNP (herniated nucleus pulposus),  lumbar    Hyperlipidemia    Hypertension    IBS (irritable bowel syndrome)    Impotence    Multiple lung nodules    nonspecific, largest 5mm in Jan 2016; scanned March 2017; rescan 3-6 months later   OA (osteoarthritis) of knee    right   Renal mass    evaluated; followed by urologist   Sleep apnea    on CPAP   Stroke    Torn meniscus    x 2; right knee   Past Surgical History:  Procedure Laterality Date   abdominal ultrasound  Oct 2015   Negative for eval for AAA   CAROTID PTA/STENT INTERVENTION Right 11/26/2022   Procedure: CAROTID PTA/STENT INTERVENTION;  Surgeon: Renford Dills, MD;  Location: [facility] INVASIVE CV LAB;  Service: Cardiovascular;  Laterality: Right;   COLONOSCOPY     KNEE ARTHROSCOPY Right    x 2   LUMBAR LAMINECTOMY     PROSTATECTOMY     robotic   Social History:  reports that he quit smoking about 51 years ago. His smoking use included cigarettes. He has a 4.50 pack-year smoking history. He has been exposed to tobacco smoke. He has never used smokeless tobacco. He reports that he does not drink alcohol and does not use drugs.  Allergies  Allergen Reactions   Flomax [Tamsulosin Hcl] Shortness Of Breath   Ace Inhibitors Other (See Comments)    Angioedema   Amlodipine Other (See Comments)    Angioedema  Doxazosin    Lipitor [Atorvastatin] Other (See Comments)    Memory loss    Family History  Problem Relation Age of Onset   Stroke Mother    Hypertension Mother    Aneurysm Father        cerebral   Hypertension Father    Heart disease Paternal Uncle    Alcohol abuse Brother    Cancer Neg Hx    COPD Neg Hx    Diabetes Neg Hx     Prior to Admission medications   Medication Sig Start Date End Date Taking? Authorizing Provider  acetaminophen (TYLENOL) 650 MG CR tablet Take 2 tablets (1,300 mg total) by mouth every 12 (twelve) hours as needed (Mild pain). 08/29/22  Yes Jared Cline, Jared Greenland, MD  apixaban (ELIQUIS) 5 MG TABS tablet Take 1 tablet (5 mg  total) by mouth 2 (two) times daily. 09/30/22  Yes Earna Coder, MD  aspirin EC 81 MG tablet Take 1 tablet (81 mg total) by mouth daily at 6 (six) AM. Swallow whole. 11/28/22  Yes Pace, Brien R, NP  cholecalciferol (VITAMIN D3) 25 MCG (1000 UNIT) tablet Take 1,000 Units by mouth daily.   Yes [provider]  CINNAMON PO Take by mouth.   Yes [provider]  cloNIDine (CATAPRES) 0.2 MG tablet Take 0.2 mg by mouth 2 (two) times daily.   Yes [provider]  clopidogrel (PLAVIX) 75 MG tablet Take 1 tablet (75 mg total) by mouth daily. 10/21/22  Yes Schnier, Latina Craver, MD  cyanocobalamin (VITAMIN B12) 1000 MCG tablet Take 1,000 mcg by mouth daily.   Yes [provider]  diclofenac Sodium (VOLTAREN) 1 % GEL Apply 2 g topically 4 (four) times daily. 09/09/22  Yes Love, Evlyn Kanner, PA-C  GARLIC PO Take by mouth.   Yes [provider]  hydrALAZINE (APRESOLINE) 10 MG tablet TAKE 1 TABLET BY MOUTH TWICE DAILY AS NEEDED; TAKE IF BLOOD PRESSURE GREATER THAN 140/ 90 10/31/22  Yes Berniece Salines, FNP  labetalol (NORMODYNE) 100 MG tablet 100 mg 2 (two) times daily. 10/02/22  Yes [provider]  pravastatin (PRAVACHOL) 80 MG tablet Take 1 tablet (80 mg total) by mouth at bedtime. 10/11/22  Yes Berniece Salines, FNP  senna-docusate (SENOKOT-S) 8.6-50 MG tablet Take 3 tablets by mouth daily at 6 (six) AM. 09/09/22  Yes Love, Evlyn Kanner, PA-C  TURMERIC PO Take by mouth.   Yes [provider]  valsartan (DIOVAN) 160 MG tablet Take 160 mg by mouth daily. 10/02/22  Yes [provider]    Physical Exam: Vitals:   12/05/22 1405 12/05/22 1407 12/05/22 1700 12/05/22 1800  BP:  (!) 158/101 (!) 163/66 (!) 178/84  Pulse:  63    Resp: 18  17 11   Temp:  97.7 F (36.5 C)    SpO2: 98% (!) 9%     Constitutional: Acutely ill looking, no distress NAD, calm, comfortable Eyes: PERRL, lids and conjunctivae normal ENMT: Mucous membranes are moist. Posterior  pharynx clear of any exudate or lesions.Normal dentition.  Neck: normal, supple, no masses, no thyromegaly Respiratory: clear to auscultation bilaterally, no wheezing, no crackles. Normal respiratory effort. No accessory muscle use.  Cardiovascular: Regular rate and rhythm, no murmurs / rubs / gallops. No extremity edema. 2+ pedal pulses. No carotid bruits.  Abdomen: no tenderness, no masses palpated. No hepatosplenomegaly. Bowel sounds positive.  Musculoskeletal: Good range of motion, no joint swelling or tenderness, Skin: no rashes, lesions, ulcers. No induration Neurologic:  CN 2-12 grossly intact. Sensation intact, DTR normal. Strength 5/5 in all 4.  Psychiatric: Normal judgment and insight. Alert and oriented x 3. Normal mood  Data Reviewed:  Sodium 134, hemoglobin 10.9 otherwise chemistry within normal urinalysis is negative.  Chest x-ray showed no acute findings.  Head CT without contrast showed no acute findings but MRI of the brain showed punctate acute infarcts in the anterior right frontal lobe subcortical white matter.  Abdominal x-ray showed no acute abnormalities.  Assessment and Plan:  #1 acute CVA: Patient will be admitted.  Initiate CVA workup again.  Neurology consult in the morning.  Aspirin and statin.  Patient already on full anticoagulation.  Has had right carotid artery stenosis but has stent in place.  Echocardiogram was performed in January but was reported as suboptimal.  Patient may require repeat echo and probably TEE.  Defer to neurology for risk stratification of this recurrent neurologic events.  Permissive hypertension  #2 lower extremity edema with possible ascites: Suspect fluid overload.  May consider workup for CHF.  Patient may require repeat echocardiogram due to these's findings.  I will diurese after permissive hypertension.  #3 malignant hypertension: Blood pressure is markedly elevated but post CVA may allow some permissive hypertension.  #4  hyperlipidemia: Continue with statin  #5 chronic kidney disease stage III: Appears to be at baseline.  Continue to monitor renal function  #6 history of prostate cancer: Continue close monitoring.  #7 coronary artery disease: Stable at baseline.  #8 obstructive sleep apnea: Continue with CPAP    Advance Care Planning:   Code Status: Full Code   Consults: Neurology consult in the morning  Family Communication: Mother and wife at bedside  Severity of Illness: The appropriate patient status for this patient is INPATIENT. Inpatient status is judged to be reasonable and necessary in order to provide the required intensity of service to ensure the patient's safety. The patient's presenting symptoms, physical exam findings, and initial radiographic and laboratory data in the context of their chronic comorbidities is felt to place them at high risk for further clinical deterioration. Furthermore, it is not anticipated that the patient will be medically stable for discharge from the hospital within 2 midnights of admission.   * I certify that at the point of admission it is my clinical judgment that the patient will require inpatient hospital care spanning beyond 2 midnights from the point of admission due to high intensity of service, high risk for further deterioration and high frequency of surveillance required.*  AuthorLonia Blood, MD 12/05/2022 8:06 PM  For on call review [email or web address].

## 2022-12-05 NOTE — Telephone Encounter (Signed)
  Chief Complaint: new onset dizziness Symptoms: dizziness when goes from sitting to standing Frequency: started today Pertinent Negatives: Patient denies fever, chest pain, vomiting, diarrhea, bleeding Disposition: [] ED /[] Urgent Care (no appt availability in office) / [x] Appointment(In office/virtual)/ []  [facility]/ [] Home Care/ [] Refused Recommended Disposition /[] [facility]/ []  Follow-up with PCP Additional Notes: Call to office- patient was scheduled for 2:40- patient advised

## 2022-12-05 NOTE — Assessment & Plan Note (Signed)
Sent to er for eval for new symptoms that started this am

## 2022-12-05 NOTE — Progress Notes (Signed)
BP (!) 172/62   Pulse 84   Temp 97.8 F (36.6 C)   Resp 18   Ht 6' (1.829 m)   Wt 225 lb 3.2 oz (102.2 kg)   SpO2 99%   BMI 30.54 kg/m    Subjective:    Patient ID: Jared Cline, male    DOB: 06-25-1946, 77 y.o.   MRN: [id number]  HPI: Jared Cline is a 77 y.o. male  Chief Complaint  Patient presents with   Dizziness    W/ headache worse since this am and leg weakness   Dizziness/headache/weakness: Patient has history of cerebral stroke on 08/24/2022. He recently had a right carotid stent placed on 11/26/2022. He is currently on eliquis as well. Patient reports symptoms started this am.  He says he woke up dizzy, headache and bilateral leg weakness.  He denies any nausea or vomiting. He says he does not feel right.  He is unstable on his feet especially when standing up.  He has a slight left arm drift.  Equal grip.  Will send to er for evaluation.   Patient sent to ER via POV, report given to Kennon Rounds RN at [facility].  Relevant past medical, surgical, family and social history reviewed and updated as indicated. Interim medical history since our last visit reviewed. Allergies and medications reviewed and updated.  Review of Systems  Constitutional: Negative for fever or weight change.  Respiratory: Negative for cough and shortness of breath.   Cardiovascular: Negative for chest pain or palpitations.  Gastrointestinal: Negative for abdominal pain, no bowel changes.  Musculoskeletal: Negative for gait problem or joint swelling.  Skin: Negative for rash.  Neurological: positive for dizziness and headache.  No other specific complaints in a complete review of systems (except as listed in HPI above).      Objective:    BP (!) 172/62   Pulse 84   Temp 97.8 F (36.6 C)   Resp 18   Ht 6' (1.829 m)   Wt 225 lb 3.2 oz (102.2 kg)   SpO2 99%   BMI 30.54 kg/m   Wt Readings from Last 3 Encounters:  12/05/22 225 lb 3.2 oz (102.2 kg)  11/28/22 224 lb 11.2 oz (101.9 kg)  11/26/22 222 lb  (100.7 kg)    Physical Exam  Constitutional: Patient appears well-developed and well-nourished. Obese  No distress.  HEENT: head atraumatic, normocephalic, pupils equal and reactive to light, ears TMs clear, neck supple, throat within normal limits Cardiovascular: Normal rate, regular rhythm and normal heart sounds.  No murmur heard. No BLE edema. Pulmonary/Chest: Effort normal and breath sounds normal. No respiratory distress. Abdominal: Soft.  There is no tenderness. Neuro: positive romberg test, minor left arm drift, no facial droop, equal grip Psychiatric: Patient has a normal mood and affect. behavior is normal. Judgment and thought content normal.   Results for orders placed or performed during the hospital encounter of 11/26/22  BUN  Result Value Ref Range   BUN 19 8 - 23 mg/dL  Creatinine, serum  Result Value Ref Range   Creatinine, Ser 1.32 (H) 0.61 - 1.24 mg/dL   GFR, Estimated 55 (L) >60 mL/min  CBC  Result Value Ref Range   WBC 6.3 4.0 - 10.5 K/uL   RBC 4.00 (L) 4.22 - 5.81 MIL/uL   Hemoglobin 11.3 (L) 13.0 - 17.0 g/dL   HCT 40.9 (L) 81.1 - 91.4 %   MCV 87.3 80.0 - 100.0 fL   MCH 28.3 26.0 - 34.0 pg  MCHC 32.4 30.0 - 36.0 g/dL   RDW 16.1 09.6 - 04.5 %   Platelets 277 150 - 400 K/uL   nRBC 0.0 0.0 - 0.2 %  Basic metabolic panel  Result Value Ref Range   Sodium 136 135 - 145 mmol/L   Potassium 4.3 3.5 - 5.1 mmol/L   Chloride 106 98 - 111 mmol/L   CO2 24 22 - 32 mmol/L   Glucose, Bld 91 70 - 99 mg/dL   BUN 19 8 - 23 mg/dL   Creatinine, Ser 4.09 (H) 0.61 - 1.24 mg/dL   Calcium 9.1 8.9 - 81.1 mg/dL   GFR, Estimated 54 (L) >60 mL/min   Anion gap 6 5 - 15  POCT Activated clotting time  Result Value Ref Range   Activated Clotting Time 244 seconds      Assessment & Plan:   Problem List Items Addressed This Visit       Other   Dizziness    Sent to er for eval      Other Visit Diagnoses     Acute nonintractable headache, unspecified headache type    -   Primary   sent to er for eval   Weakness       sent to er for eval        Follow up plan: Return if symptoms worsen or fail to improve.

## 2022-12-05 NOTE — ED Triage Notes (Signed)
Pt to ED from PCP for dizziness and generalized weakness. Reports noticed bilateral leg weakness, chest pain and dizziness when waking up this morning.  Denies pain at this time Pt alert and oriented, clear speech, RR even and unlabored, able to move all extremities, equal grip.

## 2022-12-05 NOTE — Telephone Encounter (Signed)
Summary: Dizzy spells   Pi reports dizzy spells  [phone]         Reason for Disposition  [1] MODERATE dizziness (e.g., interferes with normal activities) AND [2] has NOT been evaluated by doctor (or NP/PA) for this  (Exception: Dizziness caused by heat exposure, sudden standing, or poor fluid intake.)  Answer Assessment - Initial Assessment Questions 1. DESCRIPTION: "Describe your dizziness."     When stands- patient gets lightheaded 2. LIGHTHEADED: "Do you feel lightheaded?" (e.g., somewhat faint, woozy, weak upon standing)     Only when moving 3. VERTIGO: "Do you feel like either you or the room is spinning or tilting?" (i.e. vertigo)     no 4. SEVERITY: "How bad is it?"  "Do you feel like you are going to faint?" "Can you stand and walk?"   - MILD: Feels slightly dizzy, but walking normally.   - MODERATE: Feels unsteady when walking, but not falling; interferes with normal activities (e.g., school, work).   - SEVERE: Unable to walk without falling, or requires assistance to walk without falling; feels like passing out now.      moderate 5. ONSET:  "When did the dizziness begin?"     This morning 6. AGGRAVATING FACTORS: "Does anything make it worse?" (e.g., standing, change in head position)     Sitting/standing 7. HEART RATE: "Can you tell me your heart rate?" "How many beats in 15 seconds?"  (Note: not all patients can do this)       Not checked 8. CAUSE: "What do you think is causing the dizziness?"     unsure 9. RECURRENT SYMPTOM: "Have you had dizziness before?" If Yes, ask: "When was the last time?" "What happened that time?"     Yes- stroke 10. OTHER SYMPTOMS: "Do you have any other symptoms?" (e.g., fever, chest pain, vomiting, diarrhea, bleeding)       no  Protocols used: Dizziness - Lightheadedness-A-AH

## 2022-12-06 ENCOUNTER — Inpatient Hospital Stay (HOSPITAL_COMMUNITY)
Admit: 2022-12-06 | Discharge: 2022-12-06 | Disposition: A | Discharge status: Home / Self Care | Payer: Medicare Other | Attending: Internal Medicine | Admitting: Internal Medicine

## 2022-12-06 ENCOUNTER — Encounter: Payer: Self-pay | Admitting: Internal Medicine

## 2022-12-06 ENCOUNTER — Inpatient Hospital Stay: Payer: Medicare Other

## 2022-12-06 DIAGNOSIS — I10 Essential (primary) hypertension: Secondary | ICD-10-CM

## 2022-12-06 DIAGNOSIS — E669 Obesity, unspecified: Secondary | ICD-10-CM | POA: Insufficient documentation

## 2022-12-06 DIAGNOSIS — N182 Chronic kidney disease, stage 2 (mild): Secondary | ICD-10-CM | POA: Insufficient documentation

## 2022-12-06 DIAGNOSIS — I5031 Acute diastolic (congestive) heart failure: Secondary | ICD-10-CM | POA: Diagnosis not present

## 2022-12-06 DIAGNOSIS — R55 Syncope and collapse: Secondary | ICD-10-CM | POA: Diagnosis not present

## 2022-12-06 DIAGNOSIS — I639 Cerebral infarction, unspecified: Secondary | ICD-10-CM | POA: Diagnosis not present

## 2022-12-06 LAB — ECHOCARDIOGRAM COMPLETE
AR max vel: 3.18 cm2
AV Area VTI: 3.44 cm2
AV Area mean vel: 3 cm2
AV Mean grad: 5.5 mmHg
AV Peak grad: 8.9 mmHg
Ao pk vel: 1.49 m/s
Area-P 1/2: 2.31 cm2
Height: 72 in
MV VTI: 3.56 cm2
S' Lateral: 2.9 cm
Weight: 3603.2 oz

## 2022-12-06 LAB — LIPID PANEL
Cholesterol: 133 mg/dL (ref 0–200)
HDL: 48 mg/dL (ref >40)
LDL Cholesterol: 68 mg/dL (ref 0–99)
Total CHOL/HDL Ratio: 2.8 RATIO
Triglycerides: 83 mg/dL (ref <150)
VLDL: 17 mg/dL (ref 0–40)

## 2022-12-06 LAB — BRAIN NATRIURETIC PEPTIDE: B Natriuretic Peptide: 193.9 pg/mL — ABNORMAL HIGH (ref 0.0–100.0)

## 2022-12-06 MED ORDER — FUROSEMIDE 10 MG/ML IJ SOLN
40.0000 mg | Freq: Two times a day (BID) | INTRAMUSCULAR | Status: DC
  Administered 2022-12-06: 40 mg via INTRAVENOUS
  Filled 2022-12-06: qty 4

## 2022-12-06 MED ORDER — HYDRALAZINE HCL 20 MG/ML IJ SOLN: 10.0000 mg | Freq: Four times a day (QID) | INTRAMUSCULAR | Status: DC | PRN

## 2022-12-06 MED ORDER — SENNOSIDES-DOCUSATE SODIUM 8.6-50 MG PO TABS
2.0000 | ORAL_TABLET | Freq: Two times a day (BID) | ORAL | Status: DC
  Administered 2022-12-06 – 2022-12-07 (×2): 2 via ORAL
  Filled 2022-12-06 (×2): qty 2

## 2022-12-06 MED ORDER — ONDANSETRON HCL 4 MG/2ML IJ SOLN
4.0000 mg | Freq: Four times a day (QID) | INTRAMUSCULAR | Status: DC | PRN
  Administered 2022-12-06 (×2): 4 mg via INTRAVENOUS
  Filled 2022-12-06 (×2): qty 2

## 2022-12-06 MED ORDER — SODIUM CHLORIDE 0.9 % IV BOLUS
500.0000 mL | Freq: Once | INTRAVENOUS | Status: AC
  Administered 2022-12-06: 500 mL via INTRAVENOUS

## 2022-12-06 NOTE — Evaluation (Signed)
Physical Therapy Evaluation Patient Details Name: Jared Cline MRN: [id number] DOB: Jun 08, 1946 Today's Date: 12/06/2022  History of Present Illness  Jared Cline is a 77 y.o. male  who presents to the emergency department today from primary care office because of concern for possible stroke. Pt reported waking up feeling dizzy and weak. His MRI showed punctate acute infarcts in the anterior right frontal lobe subcortical white matter. PMH: recurrent CVA with his first CVA in January of 2024, BPH, chronic kidney disease stage II, herniated lumbar disc, essential hypertension, hyperlipidemia, history of prostate cancer, obstructive sleep apnea and symptomatic bradycardia.   Clinical Impression  Pt admitted with above diagnosis. Pt currently with functional limitations due to the deficits listed below (see PT Problem List). Pt received upright sitting EOB in care of OT performing hand off. Per pt and OT, pt had AIR in January and has been managing at home at mod-I level using RW and [facility] and mod-I with ADL's/IADL's.   To date, pt is minguard for STS efforts and supervision for gait in room using RW. Pt generally with slowed but consistent gait cadence with safe and correct use of AD. Gait distance limited due to dizziness. See OT note for visual screen. Pt denies any blurred vision, visual field cuts. Pt without focal weakness and symmetrical coordination, RAMPS and sensation testing. Unsure of current dizziness is in the setting of current elevated BP which is limiting safe gait progression this date. Education provided on energy conservation and use of DME to optimize safe mobility in home setting if dizziness persists such as using RW and BSC. Pt and spouse understanding. Pt with all needs in reach seated EOB. Pt will benefit from f/u PT recs to address pt's deficits in dizziness and limited gait endurance with AD.    Recommendations for follow up therapy are one component of a multi-disciplinary discharge  planning process, led by the attending physician.  Recommendations may be updated based on patient status, additional functional criteria and insurance authorization.     Assistance Recommended at Discharge Frequent or constant Supervision/Assistance  Patient can return home with the following  A little help with walking and/or transfers;Assist for transportation;A little help with bathing/dressing/bathroom;Help with stairs or ramp for entrance    Equipment Recommendations None recommended by PT  Recommendations for Other Services       Functional Status Assessment Patient has had a recent decline in their functional status and demonstrates the ability to make significant improvements in function in a reasonable and predictable amount of time.     Precautions / Restrictions Precautions Precautions: Fall Restrictions Weight Bearing Restrictions: No      Mobility  Bed Mobility               General bed mobility comments: NT. Received and left sitting EOB. Patient Response: Cooperative  Transfers Overall transfer level: Needs assistance Equipment used: Rolling walker (2 wheels) Transfers: Sit to/from Stand Sit to Stand: Min guard                Ambulation/Gait Ambulation/Gait assistance: Supervision Gait Distance (Feet): 20 Feet Assistive device: Rolling walker (2 wheels) Gait Pattern/deviations: Step-through pattern, Decreased step length - right, Decreased step length - left       General Gait Details: generally slow gait but steady and consistent cadence. Deferred further distances/activty due to dizziness and elevated BP.  Stairs            Wheelchair Mobility    Modified Rankin (Stroke Patients Only)  Balance Overall balance assessment: Mild deficits observed, not formally tested                                           Pertinent Vitals/Pain Pain Assessment Pain Assessment: No/denies pain    Home Living  Family/patient expects to be discharged to:: Private residence Living Arrangements: Spouse/significant other Available Help at Discharge: Family;Available 24 hours/day Type of Home: House Home Access: Stairs to enter Entrance Stairs-Rails: Left;Right;Can reach both Entrance Stairs-Number of Steps: 5   Home Layout: One level Home Equipment: Cane - quad;Cane - single Librarian, academic (2 wheels);BSC/3in1      Prior Function Prior Level of Function : Independent/Modified Independent             Mobility Comments: using RW to [facility] at times. Working with [facility] PT services. ADLs Comments: Mod-I     Hand Dominance        Extremity/Trunk Assessment   Upper Extremity Assessment Upper Extremity Assessment: Defer to OT evaluation    Lower Extremity Assessment Lower Extremity Assessment: Generalized weakness    Cervical / Trunk Assessment Cervical / Trunk Assessment: Normal  Communication   Communication: No difficulties  Cognition Arousal/Alertness: Awake/alert Behavior During Therapy: WFL for tasks assessed/performed Overall Cognitive Status: Within Functional Limits for tasks assessed                                          General Comments General comments (skin integrity, edema, etc.): BP prior to mobility: 197/97 mm Hg, BP post gait: 208/81 mm Hg. BP 3-5 minutes post gait: 189/93 mm Hg.    Exercises Other Exercises Other Exercises: MMT in LE's grossly 4/5 in sitting bilat, FTN, RAMPS and HTS symmetrical   Assessment/Plan    PT Assessment Patient needs continued PT services  PT Problem List Decreased strength;Decreased activity tolerance;Decreased balance;Decreased mobility       PT Treatment Interventions DME instruction;Balance training;Gait training;Neuromuscular re-education;Stair training;Functional mobility training;Patient/family education;Therapeutic activities;Therapeutic exercise    PT Goals (Current goals can be found in the Care  Plan section)  Acute Rehab PT Goals Patient Stated Goal: prevent future CVA's PT Goal Formulation: With patient/family Time For Goal Achievement: 12/20/22 Potential to Achieve Goals: Fair    Frequency 7X/week     Co-evaluation               AM-PAC PT "6 Clicks" Mobility  Outcome Measure Help needed turning from your back to your side while in a flat bed without using bedrails?: A Little Help needed moving from lying on your back to sitting on the side of a flat bed without using bedrails?: A Little Help needed moving to and from a bed to a chair (including a wheelchair)?: A Little Help needed standing up from a chair using your arms (e.g., wheelchair or bedside chair)?: A Little Help needed to walk in hospital room?: A Little Help needed climbing 3-5 steps with a railing? : A Lot 6 Click Score: 17    End of Session   Activity Tolerance: Treatment limited secondary to medical complications (Comment) (elevated BP) Patient left: in bed;with call bell/phone within reach;with family/visitor present Nurse Communication: Mobility status PT Visit Diagnosis: Other abnormalities of gait and mobility (R26.89);Muscle weakness (generalized) (M62.81);Dizziness and giddiness (R42)    Time: 5784-6962  PT Time Calculation (min) (ACUTE ONLY): 26 min   Charges:   PT Evaluation $PT Eval Low Complexity: 1 Low PT Treatments $Therapeutic Activity: 8-22 mins        Damonie Furney M. Fairly IV, PT, DPT Physical Therapist-   [facility] 12/06/2022, 11:46 AM

## 2022-12-06 NOTE — Progress Notes (Signed)
*  PRELIMINARY RESULTS* Echocardiogram 2D Echocardiogram has been performed.  Jared Cline 12/06/2022, 2:19 PM

## 2022-12-06 NOTE — TOC Initial Note (Signed)
Transition of Care [facility]) - Initial/Assessment Note    Patient Details  Name: Jared Cline MRN: [id number] Date of Birth: 1946/05/28  Transition of Care [facility]) CM/SW Contact:    Darolyn Rua, LCSW Phone Number: 12/06/2022, 1:55 PM  Clinical Narrative:                  Patient is active with [facility] for PT , MD made aware of orders needed prior to discharge.   TOC will continue to follow for needs.   Expected Discharge Plan: Home w Home Health Services Barriers to Discharge: Continued Medical Work up   Patient Goals and CMS Choice            Expected Discharge Plan and Services                                   HH Arranged: PT [facility] Agency: [facility] Date HH Agency Contacted: 12/06/22 Time HH Agency Contacted: 1355 Representative spoke with at [facility] Agency: Barbara Cower  Prior Living Arrangements/Services       Do you feel safe going back to the place where you live?: Yes               Activities of Daily Living Home Assistive Devices/Equipment: Cane (specify quad or straight), Walker (specify type), Eyeglasses, CPAP ADL Screening (condition at time of admission) Patient's cognitive ability adequate to safely complete daily activities?: Yes Is the patient deaf or have difficulty hearing?: No Does the patient have difficulty seeing, even when wearing glasses/contacts?: No Does the patient have difficulty concentrating, remembering, or making decisions?: No Patient able to express need for assistance with ADLs?: Yes Does the patient have difficulty dressing or bathing?: No Independently performs ADLs?: Yes (appropriate for developmental age) Does the patient have difficulty walking or climbing stairs?: No Weakness of Legs: Both Weakness of Arms/Hands: None  Permission Sought/Granted                  Emotional Assessment       Orientation: : Oriented to Self, Oriented to Place, Oriented to  Time, Oriented to  Situation Alcohol / Substance Use: Not Applicable Psych Involvement: No (comment)  Admission diagnosis:  Acute CVA (cerebrovascular accident) [I63.9] Patient Active Problem List   Diagnosis Date Noted   Near syncope 12/06/2022   CKD (chronic kidney disease) stage 2, GFR 60-89 ml/min 12/06/2022   Obesity (BMI 30-39.9) 12/06/2022   Acute CVA (cerebrovascular accident) 12/05/2022   Carotid artery stenosis, symptomatic, right 11/26/2022   Symptomatic carotid artery stenosis 10/20/2022   History of CVA (cerebrovascular accident) 09/24/2022   Acute pulmonary embolism without acute cor pulmonale 09/24/2022   Hyponatremia 09/10/2022   Constipation 09/10/2022   Cerebellar stroke 08/29/2022   CVA (cerebral vascular accident) 08/25/2022   Acute cerebrovascular accident of cerebellum 08/24/2022   Frequent falls 08/24/2022   Abdominal pain 08/24/2022   Abnormal CT of the chest 08/24/2022   Cerebellar stroke, acute 08/24/2022   Cerebral infarction, unspecified 08/24/2022   Aortic atherosclerosis 03/21/2022   Hypercalcemia 10/24/2021   Coronary artery disease of native artery of native heart with stable angina pectoris 07/17/2021   History of radical prostatectomy 07/04/2021   Cyst of kidney, acquired 07/04/2021   Secondary osteoarthritis, unspecified site 05/30/2021   Pure hypercholesterolemia, unspecified 05/30/2021   Urinary frequency 05/02/2021   Dizziness 02/14/2021   Osteoarthritis of knees, bilateral 12/28/2020   Numbness  and tingling of both lower extremities 04/19/2020   Swelling of both lower extremities 04/19/2020   Numbness of fingers of both hands 04/19/2020   PVD (peripheral vascular disease) 03/24/2020   Leg pain, bilateral 03/24/2020   LVH (left ventricular hypertrophy) due to hypertensive disease, without heart failure 05/06/2018   First degree AV block 04/15/2018   Coronary artery disease involving native coronary artery of native heart 04/15/2018   Medication monitoring  encounter 06/09/2017   Coronary artery calcification seen on CAT scan 03/20/2017   OSA on CPAP 11/22/2015   Atherosclerosis of native arteries of extremity with intermittent claudication 10/25/2015   Renal cyst 10/19/2015   Hyperlipidemia    Hypertension    Carpal tunnel syndrome, left    History of prostate cancer    Multiple lung nodules    PCP:  Berniece Salines, FNP Pharmacy:   [id number] [place], [place] - [address] AT [facility] OF [address] [place] [address] Phone: [phone] Fax: [phone]  [facility] [address] [place] [address] Phone: [phone] Fax: [phone]     Social Determinants of Health (SDOH) Social History: SDOH Screenings   Food Insecurity: No Food Insecurity (12/06/2022)  Recent Concern: Food Insecurity - Food Insecurity Present (09/30/2022)  Housing: Low Risk  (12/06/2022)  Transportation Needs: No Transportation Needs (12/06/2022)  Utilities: Not At Risk (12/06/2022)  Alcohol Screen: Low Risk  (11/28/2022)  Depression (PHQ2-9): Low Risk  (12/05/2022)  Financial Resource Strain: Low Risk  (07/09/2022)  Physical Activity: Inactive (07/05/2021)  Social Connections: Socially Integrated (07/09/2022)  Stress: No Stress Concern Present (07/09/2022)  Tobacco Use: Medium Risk (12/06/2022)   SDOH Interventions:     Readmission Risk Interventions     No data to display

## 2022-12-06 NOTE — ED Notes (Signed)
pt attempted to stand  and felt really dizzy, got sweaty,  couldn't stand, and felt his chest fluttering (HR 40's at the time ). he said he doesn't feel comfortable leaving today after that episode.  Pt assisted back into bed. MD made aware. Pt is diaphoretic on assessment.

## 2022-12-06 NOTE — Progress Notes (Signed)
SLP Cancellation Note  Patient Details Name: Jared Cline MRN: [id number] DOB: 10-02-1945   Cancelled treatment:       Reason Eval/Treat Not Completed: SLP screened, no needs identified, will sign off   Dallen Bunte 12/06/2022, 1:39 PM

## 2022-12-06 NOTE — Hospital Course (Addendum)
Jared Cline is a 77 y.o. male with medical history significant of recurrent CVA with his first CVA in January of this year, BPH, chronic kidney disease stage 2, herniated lumbar disc, essential hypertension, hyperlipidemia, history of prostate cancer, obstructive sleep apnea and symptomatic bradycardia who presents to the ER with dizziness and weakness. MRI showed punctate acute infarcts in the anterior right frontal lobe subcortical white matter. Neurology consult obtained.

## 2022-12-06 NOTE — Evaluation (Signed)
Occupational Therapy Evaluation Patient Details Name: Jared Cline MRN: [id number] DOB: 1945-09-07 Today's Date: 12/06/2022   History of Present Illness Jared Cline is a 77 y.o. male  who presents to the emergency department today from primary care office because of concern for possible stroke. Pt reported waking up feeling dizzy and weak. His MRI showed punctate acute infarcts in the anterior right frontal lobe subcortical white matter. PMH: recurrent CVA with his first CVA in January of 2024, BPH, chronic kidney disease stage II, herniated lumbar disc, essential hypertension, hyperlipidemia, history of prostate cancer, obstructive sleep apnea and symptomatic bradycardia.   Clinical Impression   Jared Cline was seen for OT evaluation this date. Prior to hospital admission, pt was residing at home with his spouse. Pt reports doing well with ADL management since recent stent in inpatient rehab after his initial stroke in January. Pt reports he regularly works with home health OT/PT services and was walking "around the block" using his walker. Per pt/spouse pt is MOD I for all IADL management using a TTB for bathing and RW for functional mobility. Pt presents to acute OT demonstrating impaired ADL performance and functional mobility 2/2 dizziness with mobility, decreased activity tolerance and impaired balance (See OT problem list for additional functional deficits). Pt currently requires MIN A for LB ADL management from STS and close min guard for short functional transfer in room with RW. Pt reports 8/10 dizziness t/o session but remains motivated to participate in therapy session.  Pt would benefit from skilled OT services to address noted impairments and functional limitations (see below for any additional details) in order to maximize safety and independence while minimizing falls risk and caregiver burden. Anticipate the need for ongoing OT services upon acute hospital DC.        Recommendations for  follow up therapy are one component of a multi-disciplinary discharge planning process, led by the attending physician.  Recommendations may be updated based on patient status, additional functional criteria and insurance authorization.   Assistance Recommended at Discharge Intermittent Supervision/Assistance  Patient can return home with the following A little help with walking and/or transfers;A little help with bathing/dressing/bathroom;Help with stairs or ramp for entrance;Assist for transportation;Assistance with cooking/housework    Functional Status Assessment  Patient has had a recent decline in their functional status and demonstrates the ability to make significant improvements in function in a reasonable and predictable amount of time.  Equipment Recommendations  BSC/3in1    Recommendations for Other Services       Precautions / Restrictions Precautions Precautions: Fall Restrictions Weight Bearing Restrictions: No      Mobility Bed Mobility Overal bed mobility: Needs Assistance Bed Mobility: Supine to Sit     Supine to sit: Min guard, HOB elevated          Transfers Overall transfer level: Needs assistance Equipment used: Rolling walker (2 wheels) Transfers: Sit to/from Stand Sit to Stand: Min guard, From elevated surface                  Balance Overall balance assessment: Needs assistance Sitting-balance support: Feet supported, No upper extremity supported Sitting balance-Leahy Scale: Good Sitting balance - Comments: steady static sitting EOB during weight shift.   Standing balance support: During functional activity, Bilateral upper extremity supported Standing balance-Leahy Scale: Fair Standing balance comment: limited by dizziness                           ADL  either performed or assessed with clinical judgement   ADL Overall ADL's : Needs assistance/impaired                                       General ADL  Comments: MOD A to don pant/socks. Close min guard during bed mobility and functional transfers using RW.     Vision Baseline Vision/History: 1 Wears glasses Ability to See in Adequate Light: 1 Impaired Patient Visual Report: No change from baseline Vision Assessment?: Yes Eye Alignment: Within Functional Limits Tracking/Visual Pursuits: Decreased smoothness of eye movement to LEFT superior field;Decreased smoothness of eye movement to LEFT inferior field Saccades: Additional eye shifts occurred during testing;Decreased speed of saccadic movement Additional Comments: mild difficulty with saccades tends lose track of visual target and shift back to midline.     Perception     Praxis      Pertinent Vitals/Pain Pain Assessment Pain Assessment: No/denies pain (dizziness)     Hand Dominance Right   Extremity/Trunk Assessment Upper Extremity Assessment Upper Extremity Assessment: Overall WFL for tasks assessed (Mild decreased FMC deficits appreciated during functional activity, but BUE present grossly symmetrical with strength WFL, fuall AROM against gravity.)   Lower Extremity Assessment Lower Extremity Assessment: Generalized weakness   Cervical / Trunk Assessment Cervical / Trunk Assessment: Normal   Communication Communication Communication: No difficulties   Cognition Arousal/Alertness: Awake/alert Behavior During Therapy: WFL for tasks assessed/performed Overall Cognitive Status: Within Functional Limits for tasks assessed                                 General Comments: Pleasant, conversational, motiviated despite dizziness.     General Comments  BP prior to mobility: 197/97 mm Hg, BP post gait: 208/81 mm Hg. BP 3-5 minutes post gait: 189/93 mm Hg.    Exercises Other Exercises Other Exercises: Pt educated on role of OT in acute setting, safe transfer technique, and DC recs.   Shoulder Instructions      Home Living Family/patient expects to be  discharged to:: Private residence Living Arrangements: Spouse/significant other Available Help at Discharge: Family;Available 24 hours/day Type of Home: House Home Access: Stairs to enter [employer] of Steps: 5 Entrance Stairs-Rails: Left;Right;Can reach both Home Layout: One level     Bathroom Shower/Tub: Chief Strategy Officer: Handicapped height Bathroom Accessibility: Yes How Accessible: Accessible via walker Home Equipment: Cane - quad;Cane - single point;Rolling Walker (2 wheels);BSC/3in1;Tub bench          Prior Functioning/Environment Prior Level of Function : Independent/Modified Independent             Mobility Comments: using RW to [facility] at times. Working with [facility] PT services. ADLs Comments: Mod-I, per spouse/pt pt tries to be as independent as possible and has been working hard with home health OT/PT to regain independence since his first stroke in January.        OT Problem List: Decreased strength;Decreased coordination;Decreased activity tolerance;Impaired balance (sitting and/or standing);Decreased knowledge of use of DME or AE      OT Treatment/Interventions: Self-care/ADL training;Therapeutic exercise;Therapeutic activities;Patient/family education;DME and/or AE instruction;Balance training;Energy conservation;Visual/perceptual remediation/compensation;Neuromuscular education    OT Goals(Current goals can be found in the care plan section) Acute Rehab OT Goals Patient Stated Goal: To go home OT Goal Formulation: With patient Time For Goal Achievement: 12/20/22 Potential  to Achieve Goals: Good  OT Frequency: Min 2X/week    Co-evaluation              AM-PAC OT "6 Clicks" Daily Activity     Outcome Measure Help from another person eating meals?: None Help from another person taking care of personal grooming?: A Little Help from another person toileting, which includes using toliet, bedpan, or urinal?: A Little Help from  another person bathing (including washing, rinsing, drying)?: A Little Help from another person to put on and taking off regular upper body clothing?: A Little Help from another person to put on and taking off regular lower body clothing?: A Little 6 Click Score: 19   End of Session Equipment Utilized During Treatment: Rolling walker (2 wheels)  Activity Tolerance: Patient tolerated treatment well Patient left: in bed;Other (comment) (seated EOB with PT and MD in room at end of session.)  OT Visit Diagnosis: Other abnormalities of gait and mobility (R26.89);Other symptoms and signs involving the nervous system (Z61.096)                Time: 0454-0981 OT Time Calculation (min): 16 min Charges:  OT General Charges $OT Visit: 1 Visit OT Evaluation $OT Eval Moderate Complexity: 1 Mod  Rockney Ghee, M.S., OTR/L 12/06/22, 12:12 PM

## 2022-12-06 NOTE — Progress Notes (Signed)
Progress Note   Patient: Jared Cline WUJ:[id number] DOB: Jan 26, 1946 DOA: 12/05/2022     1 DOS: the patient was seen and examined on 12/06/2022   Brief hospital course: Jared Cline is a 77 y.o. male with medical history significant of recurrent CVA with his first CVA in January of this year, BPH, chronic kidney disease stage 2, herniated lumbar disc, essential hypertension, hyperlipidemia, history of prostate cancer, obstructive sleep apnea and symptomatic bradycardia who presents to the ER with dizziness and weakness. MRI showed punctate acute infarcts in the anterior right frontal lobe subcortical white matter. Neurology consult obtained.   Principal Problem:   Acute CVA (cerebrovascular accident) Active Problems:   Frequent falls   Hyperlipidemia   Hypertension   History of prostate cancer   OSA on CPAP   PVD (peripheral vascular disease)   Coronary artery disease involving native coronary artery of native heart   Hyponatremia   Near syncope   CKD (chronic kidney disease) stage 2, GFR 60-89 ml/min   Assessment and Plan: Recurrent CVA. Right carotid artery stenosis status post stent. Dyslipidemia.  LDL at goal. Patient currently taking aspirin and Eliquis.  Had recurrent stroke at admission.  He had a right carotid artery stenting earlier this months.  His LDL is at goal at 68. Will discuss with neurology to see if there is anything can be done to prevent his stroke.  Near syncope. Patient had a significant drop of blood pressure and dizziness after receiving Lasix.  Discontinue Lasix, give small dose of fluid bolus.  Bilateral leg edema. Edema much improved after giving Lasix, patient has significant leg cramping afterwards.  I will check a BNP to ensure patient does not have congestive heart failure, echocardiogram pending. I also ordered duplex ultrasound to rule out a DVT, patient is on Eliquis, if positive for DVT, anticoagulation has to be changed. Does not appear to be  arterial as patient has no pain, no evidence of ischemia.  Essential hypertension. Permissive hypertension, will place on hydralazine for blood pressure over 220/120.  Chronic kidney disease stage II. I reviewed the chart, patient condition is consistent with stage II not stage III.  History of prostate cancer. Follow-up with PCP as outpatient.  Obstruct sleep apnea. CPAP while asleep.  Coronary artery disease. Continue home medicines.    Subjective:  Patient was very dizzy and lightheaded, with dropped blood pressure while walking with physical therapy. Does not have speech disturbance, no short of breath.  Physical Exam: Vitals:   12/06/22 0900 12/06/22 0930 12/06/22 1000 12/06/22 1015  BP: (!) 177/80 (!) 197/97 (!) 189/93   Pulse: (!) 58 (!) 58 63 77  Resp: Temp:      TempSrc:      SpO2: 100% 97% 99% 96%   General exam: Appears calm and comfortable  Respiratory system: Clear to auscultation. Respiratory effort normal. Cardiovascular system: S1 & S2 heard, RRR. No JVD, murmurs, rubs, gallops or clicks. Trace pedal edema. Gastrointestinal system: Abdomen is nondistended, soft and nontender. No organomegaly or masses felt. Normal bowel sounds heard. Central nervous system: Alert and oriented. No focal neurological deficits. Extremities: Symmetric 5 x 5 power. Skin: No rashes, lesions or ulcers Psychiatry: Judgement and insight appear normal. Mood & affect appropriate.    Data Reviewed:  Reviewed prior records, MRI results, lab results.  Family Communication: Wife at bedside.  Disposition: Status is: Inpatient Remains inpatient appropriate because: Severity of disease.     Time spent: 50 minutes  Author: Marrion Coy, MD 12/06/2022 11:04 AM  For on call review [email or web address].

## 2022-12-06 NOTE — Consult Note (Signed)
Neurology Consultation Reason for Consult:  Referring Physician: Chipper Herb, D  CC: Stroke dizziness  History is obtained from: Patient  HPI: Jared Cline is a 77 y.o. male with a history of previous stroke, hypertension, hyperlipidemia who presents with dizziness that he noticed yesterday morning.  He states that when he first woke up, he did not notice it, but the first time he stood up he noticed that he was dizzy.  By dizziness he describes the same feeling as when he stands up too fast.  He states that he was also clammy and sweaty and felt ill at ease.  He also describes bilateral leg weakness that he notices when standing.  Due to the symptoms he sought care in the emergency department.   LKW: 4/17 prior to bed tnk given?: no, outside of window   Past Medical History:  Diagnosis Date   BPH (benign prostatic hypertrophy)    Bradycardia    evaluated by Dr. Gwen Pounds   Cancer    prostate   Carpal tunnel syndrome, left    left arm   CKD (chronic kidney disease) stage 2, GFR 60-89 ml/min    Diverticulitis    Gastritis 2017   History of prostate cancer    HNP (herniated nucleus pulposus), lumbar    Hyperlipidemia    Hypertension    IBS (irritable bowel syndrome)    Impotence    Multiple lung nodules    nonspecific, largest 5mm in Jan 2016; scanned March 2017; rescan 3-6 months later   OA (osteoarthritis) of knee    right   Renal mass    evaluated; followed by urologist   Sleep apnea    on CPAP   Stroke    Torn meniscus    x 2; right knee     Family History  Problem Relation Age of Onset   Stroke Mother    Hypertension Mother    Aneurysm Father        cerebral   Hypertension Father    Heart disease Paternal Uncle    Alcohol abuse Brother    Cancer Neg Hx    COPD Neg Hx    Diabetes Neg Hx      Social History:  reports that he quit smoking about 51 years ago. His smoking use included cigarettes. He has a 4.50 pack-year smoking history. He has been exposed to  tobacco smoke. He has never used smokeless tobacco. He reports that he does not drink alcohol and does not use drugs.   Exam: Current vital signs: BP (!) 152/58   Pulse 71   Temp 98.2 F (36.8 C) (Oral)   Resp 17   SpO2 96%  Vital signs in last 24 hours: Temp:  [97.7 F (36.5 C)-98.2 F (36.8 C)] 98.2 F (36.8 C) (04/19 0455) Pulse Rate:  [37-89] 71 (04/19 1200) Resp:  [11-22] 17 (04/19 1200) BP: (141-205)/(58-111) 152/58 (04/19 1200) SpO2:  [9 %-100 %] 96 % (04/19 1200) Weight:  [102.2 kg] 102.2 kg (04/18 1338)   Physical Exam  Appears well-developed and well-nourished.   Neuro: Mental Status: Patient is awake, alert, oriented to person, place, month, year, and situation. Patient is able to give a clear and coherent history. No signs of aphasia or neglect Cranial Nerves: II: Visual Fields are full. Pupils are equal, round, and reactive to light.   III,IV, VI: EOMI without ptosis or diploplia.  V: Facial sensation is symmetric to temperature VII: Facial movement is symmetric.  VIII: hearing is intact to voice  X: Uvula elevates symmetrically XI: Shoulder shrug is symmetric. XII: tongue is midline without atrophy or fasciculations.  Motor: He has bilateral 4/5 leg strength, no drift in upper extremities Sensory: Sensation is symmetric to light touch and temperature in the arms and legs. Cerebellar: Intentional tremor on finger-nose-finger bilaterally   I have reviewed labs in epic and the results pertinent to this consultation are: BMP-unremarkable  I have reviewed the images obtained: MRI brain-two punctate foci of subcortical infarct in the right frontal region  Impression: 77 year old male with aggressively controlled risk factors including LDL of 68, recent stenting of his carotid artery now on aspirin Plavix and apixaban who presents with dizziness.  His description is much more consistent with orthostasis than with new stroke, and my suspicion is that he may  have been orthostatic leading to small areas of infarct in tenuous areas of the white matter.  These are very tiny, and I think represent a relatively small risk of hemorrhagic conversion.  Though we do sometimes hold anticoagulation acutely, I think that the risk of conversion is low enough given the size of the strokes that the risk of holding anticoagulation would be higher than the risk of continuing it.  Blood pressure control without orthostasis would be a final possibility of long-term secondary stroke prevention, though I would not aggressively lower it in the acute phase, could start lowering it tomorrow.  Recommendations: 1) permissive hypertension overnight 2) continue apixaban, aspirin, Plavix 3) orthostatic vital signs 4) TTE, telemetry 5) likely will need PT, OT, ST  Ritta Slot, MD [facility] [phone]  If 7pm- 7am, please page neurology on call as listed in AMION.

## 2022-12-06 NOTE — ED Notes (Addendum)
Notified Dr. Chipper Herb that patient's blood pressure has been in the 190's-200's systolic over the last approx 40 minutes. Per Dr. Chipper Herb, BP treatment begins at 220/120. No new orders at this time.

## 2022-12-07 DIAGNOSIS — I10 Essential (primary) hypertension: Secondary | ICD-10-CM | POA: Diagnosis not present

## 2022-12-07 DIAGNOSIS — R55 Syncope and collapse: Secondary | ICD-10-CM | POA: Diagnosis not present

## 2022-12-07 DIAGNOSIS — I639 Cerebral infarction, unspecified: Secondary | ICD-10-CM | POA: Diagnosis not present

## 2022-12-07 MED ORDER — VALSARTAN 160 MG PO TABS: 160.0000 mg | ORAL_TABLET | Freq: Every day | ORAL | Status: AC

## 2022-12-07 NOTE — Plan of Care (Signed)
  Problem: Health Behavior/Discharge Planning: Goal: Ability to manage health-related needs will improve Outcome: Progressing   Problem: Clinical Measurements: Goal: Respiratory complications will improve Outcome: Progressing   Problem: Clinical Measurements: Goal: Cardiovascular complication will be avoided Outcome: Progressing   Problem: Activity: Goal: Risk for activity intolerance will decrease Outcome: Progressing   Problem: Pain Managment: Goal: General experience of comfort will improve Outcome: Progressing   Problem: Safety: Goal: Ability to remain free from injury will improve Outcome: Progressing   

## 2022-12-07 NOTE — Progress Notes (Signed)
Patient was assessed for CPAP use. Patient wanted to inspect hospital issued NIV mask interface, He stated that he uses a nasal mask, the patient was shown a full face mask as well a nasal mask, but he did not like the hospital issed masks, further stating that he will not wear the hospital's CPAP. The patient was informed that if he can have his personal CPAP brought into the hospital, then Respiratory can assist him with set up. The patient was encouraged to bring his in.

## 2022-12-07 NOTE — Discharge Summary (Signed)
Physician Discharge Summary   Patient: Jared Cline MRN: [id number] DOB: 1946/02/15  Admit date:     12/05/2022  Discharge date: 12/07/22  Discharge Physician: Marrion Coy   PCP: Berniece Salines, FNP   Recommendations at discharge:   Follow-up with PCP in 1 week. Follow-up with neurology in 1 month.  Discharge Diagnoses: Principal Problem:   Acute CVA (cerebrovascular accident) Active Problems:   Frequent falls   Hyperlipidemia   Hypertension   History of prostate cancer   OSA on CPAP   PVD (peripheral vascular disease)   Coronary artery disease involving native coronary artery of native heart   Hyponatremia   Near syncope   CKD (chronic kidney disease) stage 2, GFR 60-89 ml/min   Obesity (BMI 30-39.9)  Resolved Problems:   * No resolved hospital problems. *  Hospital Course: Jared Cline is a 77 y.o. male with medical history significant of recurrent CVA with his first CVA in January of this year, BPH, chronic kidney disease stage 2, herniated lumbar disc, essential hypertension, hyperlipidemia, history of prostate cancer, obstructive sleep apnea and symptomatic bradycardia who presents to the ER with dizziness and weakness. MRI showed punctate acute infarcts in the anterior right frontal lobe subcortical white matter. Neurology consult obtained.  No change patient treatment regimen, stroke is very small.  Patient was kept in the hospital for another day due to orthostasis, he received fluid bolus yesterday.  Currently he becomes asymptomatic, restart blood pressure medicine at discharge.  Assessment and Plan:  Recurrent CVA. Right carotid artery stenosis status post stent. Dyslipidemia.  LDL at goal. Patient currently taking aspirin and Eliquis.  Had recurrent stroke at admission.  He had a right carotid artery stenting earlier this months.  His LDL is at goal at 68. Patient has been seen by neurology, he is already on maximal treatment with aspirin, Plavix and Eliquis.  He  is also on atorvastatin.  No additional treatment is available.  Patient has been seen by PT/OT, recommended home with treatment.   Near syncope. Patient had a significant drop of blood pressure and dizziness after receiving Lasix.  Discontinue Lasix, received IV fluid bolus.  Condition had improved.   Bilateral leg edema. Edema much improved after giving Lasix, patient has significant leg cramping afterwards.  BNP 193, no volume overload.  Echocardiogram ejection fraction of 60 to 65% with grade 1 diastolic dysfunction.  No evidence of congestive heart failure acute exacerbation this time. Duplex ultrasound did not show any DVT. Does not appear to be arterial as patient has no pain, no evidence of ischemia. Edema is better today after initial Lasix treatment.   Essential hypertension. Blood pressure medicine was on hold for permissive hypertension at time of admission.  Will gradually restart home medicines, hold ARB for 2 days and restarted afterwards.   Chronic kidney disease stage II. I reviewed the chart, patient condition is consistent with stage II not stage III.   History of prostate cancer. Follow-up with PCP as outpatient.   Obstruct sleep apnea. CPAP while asleep.   Coronary artery disease. Continue home medicines.         Consultants: Neurology. Procedures performed: None  Disposition: Home health Diet recommendation:  Discharge Diet Orders (From admission, onward)     Start     Ordered   12/07/22 0000  Diet - low sodium heart healthy        12/07/22 1021           Cardiac diet DISCHARGE MEDICATION: Allergies  as of 12/07/2022       Reactions   Flomax [tamsulosin Hcl] Shortness Of Breath   Ace Inhibitors Other (See Comments)   Angioedema   Amlodipine Other (See Comments)   Angioedema   Doxazosin    Lipitor [atorvastatin] Other (See Comments)   Memory loss        Medication List     TAKE these medications    acetaminophen 650 MG CR  tablet Commonly known as: TYLENOL Take 2 tablets (1,300 mg total) by mouth every 12 (twelve) hours as needed (Mild pain).   apixaban 5 MG Tabs tablet Commonly known as: ELIQUIS Take 1 tablet (5 mg total) by mouth 2 (two) times daily.   aspirin EC 81 MG tablet Take 1 tablet (81 mg total) by mouth daily at 6 (six) AM. Swallow whole.   cholecalciferol 25 MCG (1000 UNIT) tablet Commonly known as: VITAMIN D3 Take 1,000 Units by mouth daily.   CINNAMON PO Take by mouth.   cloNIDine 0.2 MG tablet Commonly known as: CATAPRES Take 0.2 mg by mouth 2 (two) times daily.   clopidogrel 75 MG tablet Commonly known as: Plavix Take 1 tablet (75 mg total) by mouth daily.   cyanocobalamin 1000 MCG tablet Commonly known as: VITAMIN B12 Take 1,000 mcg by mouth daily.   diclofenac Sodium 1 % Gel Commonly known as: VOLTAREN Apply 2 g topically 4 (four) times daily.   GARLIC PO Take by mouth.   hydrALAZINE 10 MG tablet Commonly known as: APRESOLINE TAKE 1 TABLET BY MOUTH TWICE DAILY AS NEEDED; TAKE IF BLOOD PRESSURE GREATER THAN 140/ 90   labetalol 100 MG tablet Commonly known as: NORMODYNE 100 mg 2 (two) times daily.   pravastatin 80 MG tablet Commonly known as: PRAVACHOL Take 1 tablet (80 mg total) by mouth at bedtime.   Senexon-S 8.6-50 MG tablet Generic drug: senna-docusate Take 3 tablets by mouth daily at 6 (six) AM.   TURMERIC PO Take by mouth.   valsartan 160 MG tablet Commonly known as: DIOVAN Take 1 tablet (160 mg total) by mouth daily. Start taking on: December 09, 2022 What changed: These instructions start on December 09, 2022. If you are unsure what to do until then, ask your doctor or other care provider.        Follow-up Information     Berniece Salines, FNP Follow up in 1 week(s).   Specialty: Nurse Practitioner Contact information: [address] [place] [address] [phone]         [facility] Follow  up in 1 month(s).   Contact information: [address] [place] [address] [phone]               Discharge Exam: Ceasar Mons Weights   12/07/22 0527  Weight: 96.4 kg   General exam: Appears calm and comfortable  Respiratory system: Clear to auscultation. Respiratory effort normal. Cardiovascular system: S1 & S2 heard, RRR. No JVD, murmurs, rubs, gallops or clicks. 1+ pedal edema. Gastrointestinal system: Abdomen is nondistended, soft and nontender. No organomegaly or masses felt. Normal bowel sounds heard. Central nervous system: Alert and oriented. No focal neurological deficits. Extremities: Symmetric 5 x 5 power. Skin: No rashes, lesions or ulcers Psychiatry: Judgement and insight appear normal. Mood & affect appropriate.    Condition at discharge: good  The results of significant diagnostics from this hospitalization (including imaging, microbiology, ancillary and laboratory) are listed below for reference.   Imaging Studies: ECHOCARDIOGRAM COMPLETE  Result Date: 12/06/2022    ECHOCARDIOGRAM REPORT   Patient Name:   KEELYN Mich Date of Exam: 12/06/2022 Medical Rec #:  [id number]   Height:       72.0 in Accession #:    [id number]  Weight:       225.2 lb Date of Birth:  07-12-1946    BSA:          2.241 m Patient Age:    77 years    BP:           152/58 mmHg Patient Gender: M           HR:           71 bpm. Exam Location:  [facility] Procedure: 2D Echo, Cardiac Doppler and Color Doppler Indications:     CHF---acute diastolic I50.31  History:         Patient has prior history of Echocardiogram examinations, most                  recent 08/26/2022. Stroke; Risk Factors:Hypertension and                  Dyslipidemia.  Sonographer:     Cristela Blue Referring Phys:  [id number] Rometta Emery Diagnosing Phys: Julien Nordmann MD IMPRESSIONS  1. Left ventricular ejection fraction, by estimation, is 60 to 65%. The left ventricle has normal function. The left ventricle has no regional  wall motion abnormalities. There is moderate left ventricular hypertrophy. Left ventricular diastolic parameters are consistent with Grade I diastolic dysfunction (impaired relaxation).  2. Right ventricular systolic function is normal. The right ventricular size is normal. There is normal pulmonary artery systolic pressure. The estimated right ventricular systolic pressure is 18.8 mmHg.  3. The mitral valve is normal in structure. No evidence of mitral valve regurgitation. No evidence of mitral stenosis.  4. The aortic valve is normal in structure. There is mild calcification of the aortic valve. Aortic valve regurgitation is not visualized. Aortic valve sclerosis/calcification is present, without any evidence of aortic stenosis.  5. The inferior vena cava is normal in size with greater than 50% respiratory variability, suggesting right atrial pressure of 3 mmHg. FINDINGS  Left Ventricle: Left ventricular ejection fraction, by estimation, is 60 to 65%. The left ventricle has normal function. The left ventricle has no regional wall motion abnormalities. The left ventricular internal cavity size was normal in size. There is  moderate left ventricular hypertrophy. Left ventricular diastolic parameters are consistent with Grade I diastolic dysfunction (impaired relaxation). Right Ventricle: The right ventricular size is normal. No increase in right ventricular wall thickness. Right ventricular systolic function is normal. There is normal pulmonary artery systolic pressure. The tricuspid regurgitant velocity is 1.86 m/s, and  with an assumed right atrial pressure of 5 mmHg, the estimated right ventricular systolic pressure is 18.8 mmHg. Left Atrium: Left atrial size was normal in size. Right Atrium: Right atrial size was normal in size. Pericardium: There is no evidence of pericardial effusion. Mitral Valve: The mitral valve is normal in structure. No evidence of mitral valve regurgitation. No evidence of mitral valve  stenosis. MV peak gradient, 4.4 mmHg. The mean mitral valve gradient is 2.0 mmHg. Tricuspid Valve: The tricuspid valve is normal in structure. Tricuspid valve regurgitation is not demonstrated. No evidence of tricuspid stenosis. Aortic Valve: The aortic valve is normal in structure. There is mild calcification of the aortic valve. Aortic valve regurgitation is not visualized. Aortic valve sclerosis/calcification is present, without any  evidence of aortic stenosis. Aortic valve mean gradient measures 5.5 mmHg. Aortic valve peak gradient measures 8.9 mmHg. Aortic valve area, by VTI measures 3.44 cm. Pulmonic Valve: The pulmonic valve was normal in structure. Pulmonic valve regurgitation is not visualized. No evidence of pulmonic stenosis. Aorta: The aortic root is normal in size and structure. Venous: The inferior vena cava is normal in size with greater than 50% respiratory variability, suggesting right atrial pressure of 3 mmHg. IAS/Shunts: No atrial level shunt detected by color flow Doppler.  LEFT VENTRICLE PLAX 2D LVIDd:         5.20 cm   Diastology LVIDs:         2.90 cm   LV e' medial:    6.42 cm/s LV PW:         1.30 cm   LV E/e' medial:  11.9 LV IVS:        1.40 cm   LV e' lateral:   6.42 cm/s LVOT diam:     2.30 cm   LV E/e' lateral: 11.9 LV SV:         109 LV SV Index:   49 LVOT Area:     4.15 cm  RIGHT VENTRICLE RV Basal diam:  3.40 cm RV Mid diam:    3.00 cm RV S prime:     10.60 cm/s TAPSE (M-mode): 2.2 cm LEFT ATRIUM             Index        RIGHT ATRIUM          Index LA diam:        3.50 cm 1.56 cm/m   RA Area:     8.74 cm LA Vol (A2C):   98.5 ml 43.96 ml/m  RA Volume:   16.20 ml 7.23 ml/m LA Vol (A4C):   40.6 ml 18.12 ml/m LA Biplane Vol: 68.2 ml 30.44 ml/m  AORTIC VALVE AV Area (Vmax):    3.18 cm AV Area (Vmean):   3.00 cm AV Area (VTI):     3.44 cm AV Vmax:           149.00 cm/s AV Vmean:          107.300 cm/s AV VTI:            0.316 m AV Peak Grad:      8.9 mmHg AV Mean Grad:      5.5  mmHg LVOT Vmax:         114.00 cm/s LVOT Vmean:        77.600 cm/s LVOT VTI:          0.262 m LVOT/AV VTI ratio: 0.83  AORTA Ao Root diam: 3.20 cm MITRAL VALVE                TRICUSPID VALVE MV Area (PHT): 2.31 cm     TR Peak grad:   13.8 mmHg MV Area VTI:   3.56 cm     TR Vmax:        186.00 cm/s MV Peak grad:  4.4 mmHg MV Mean grad:  2.0 mmHg     SHUNTS MV Vmax:       1.05 m/s     Systemic VTI:  0.26 m MV Vmean:      64.7 cm/s    Systemic Diam: 2.30 cm MV Decel Time: 329 msec MV E velocity: 76.50 cm/s MV A velocity: 113.00 cm/s MV E/A ratio:  0.68 Julien Nordmann MD Electronically signed by Marcial Pacas  Gollan MD Signature Date/Time: 12/06/2022/7:20:31 PM    Final    US Venous Img Lower Bilateral (DVT)  Result Date: 12/06/2022 CLINICAL DATA:  Lower extremity edema. EXAM: BILATERAL LOWER EXTREMITY VENOUS DOPPLER ULTRASOUND TECHNIQUE: Gray-scale sonography with graded compression, as well as color Doppler and duplex ultrasound were performed to evaluate the lower extremity deep venous systems from the level of the common femoral vein and including the common femoral, femoral, profunda femoral, popliteal and calf veins including the posterior tibial, peroneal and gastrocnemius veins when visible. The superficial great saphenous vein was also interrogated. Spectral Doppler was utilized to evaluate flow at rest and with distal augmentation maneuvers in the common femoral, femoral and popliteal veins. COMPARISON:  None Available. FINDINGS: RIGHT LOWER EXTREMITY Common Femoral Vein: No evidence of thrombus. Normal compressibility, respiratory phasicity and response to augmentation. Saphenofemoral Junction: No evidence of thrombus. Normal compressibility and flow on color Doppler imaging. Profunda Femoral Vein: No evidence of thrombus. Normal compressibility and flow on color Doppler imaging. Femoral Vein: No evidence of thrombus. Normal compressibility, respiratory phasicity and response to augmentation. Popliteal Vein:  No evidence of thrombus. Normal compressibility, respiratory phasicity and response to augmentation. Calf Veins: No evidence of thrombus. Normal compressibility and flow on color Doppler imaging. Superficial Great Saphenous Vein: No evidence of thrombus. Normal compressibility. Venous Reflux:  None. Other Findings: No evidence of superficial thrombophlebitis or abnormal fluid collection. LEFT LOWER EXTREMITY Common Femoral Vein: No evidence of thrombus. Normal compressibility, respiratory phasicity and response to augmentation. Saphenofemoral Junction: No evidence of thrombus. Normal compressibility and flow on color Doppler imaging. Profunda Femoral Vein: No evidence of thrombus. Normal compressibility and flow on color Doppler imaging. Femoral Vein: No evidence of thrombus. Normal compressibility, respiratory phasicity and response to augmentation. Popliteal Vein: No evidence of thrombus. Normal compressibility, respiratory phasicity and response to augmentation. Calf Veins: No evidence of thrombus. Normal compressibility and flow on color Doppler imaging. Superficial Great Saphenous Vein: No evidence of thrombus. Normal compressibility. Venous Reflux:  None. Other Findings: No evidence of superficial thrombophlebitis or abnormal fluid collection. IMPRESSION: No evidence of deep venous thrombosis in either lower extremity. Electronically Signed   By: Irish Lack M.D.   On: 12/06/2022 11:31   DG Abd 1 View  Result Date: 12/05/2022 CLINICAL DATA:  Abdominal pain EXAM: ABDOMEN - 1 VIEW COMPARISON:  Radiographs 09/03/2022 FINDINGS: Non dilated gas-filled colon is visualized throughout the abdomen. Average colonic stool load. No evidence of obstruction. No acute osseous abnormality. IMPRESSION: No acute abnormality.  No evidence of obstruction Electronically Signed   By: Minerva Fester M.D.   On: 12/05/2022 21:05   MR BRAIN WO CONTRAST  Result Date: 12/05/2022 CLINICAL DATA:  Dizziness, weakness EXAM: MRI  HEAD WITHOUT CONTRAST TECHNIQUE: Multiplanar, multiecho pulse sequences of the brain and surrounding structures were obtained without intravenous contrast. COMPARISON:  09/15/2022 FINDINGS: Brain: Punctate foci of restricted diffusion with ADC correlates in the anterior right frontal lobe subcortical white matter (series 5, images 85, 87-88). No acute hemorrhage, mass, mass effect, or midline shift. No hydrocephalus or extra-axial collection. Scattered T2 hyperintense signal in the periventricular white matter, likely the sequela of mild-to-moderate chronic small vessel ischemic disease. Normal cerebral volume for age. No hemosiderin deposition to suggest remote hemorrhage or superficial siderosis. Vascular: Normal arterial flow voids. Skull and upper cervical spine: Normal marrow signal. Sinuses/Orbits: Clear paranasal sinuses. No acute finding in the orbits. Other: The mastoids are well aerated. IMPRESSION: Punctate acute infarcts in the anterior right frontal lobe subcortical  white matter. These results were called by telephone at the time of interpretation on 12/05/2022 at 7:15 pm to provider [facility] , who verbally acknowledged these results. Electronically Signed   By: Wiliam Ke M.D.   On: 12/05/2022 19:17   CT HEAD WO CONTRAST ( )  Result Date: 12/05/2022 CLINICAL DATA:  Neuro deficit, acute, stroke suspected. Dizziness and generalized weakness. EXAM: CT HEAD WITHOUT CONTRAST TECHNIQUE: Contiguous axial images were obtained from the base of the skull through the vertex without intravenous contrast. RADIATION DOSE REDUCTION: This exam was performed according to the departmental dose-optimization program which includes automated exposure control, adjustment of the mA and/or kV according to patient size and/or use of iterative reconstruction technique. COMPARISON:  CTA head/neck and brain MRI 09/15/2022. FINDINGS: Brain: No acute hemorrhage. Unchanged mild chronic small-vessel disease. Cortical  gray-white differentiation is otherwise preserved. Prominence of the ventricles and sulci within expected range for age. No hydrocephalus or extra-axial collection. No mass effect or midline shift. Vascular: No hyperdense vessel or unexpected calcification. Skull: No calvarial fracture or suspicious bone lesion. Skull base is unremarkable. Sinuses/Orbits: Unremarkable. Other: None. IMPRESSION: 1. No acute intracranial abnormality. 2. Unchanged mild chronic small-vessel disease. Electronically Signed   By: Orvan Falconer M.D.   On: 12/05/2022 16:24   DG Chest 2 View  Result Date: 12/05/2022 CLINICAL DATA:  Chest pain EXAM: CHEST - 2 VIEW COMPARISON:  CXR 09/15/22 FINDINGS: No pleural effusion. No pneumothorax. No focal airspace opacity. No radiographically apparent displaced rib fractures. Visualized upper abdomen is unremarkable. Normal cardiac and mediastinal contours. Vertebral body heights are maintained IMPRESSION: No radiographic finding to explain chest pain Electronically Signed   By: Lorenza Cambridge M.D.   On: 12/05/2022 14:47   PERIPHERAL VASCULAR CATHETERIZATION  Result Date: 11/26/2022 See surgical note for result.   Microbiology: Results for orders placed or performed during the hospital encounter of 08/24/22  Resp panel by RT-PCR (RSV, Flu A&B, Covid) Anterior Nasal Swab     Status: None   Collection Time: 08/24/22  3:47 AM   Specimen: Anterior Nasal Swab  Result Value Ref Range Status   SARS Coronavirus 2 by RT PCR NEGATIVE NEGATIVE Final    Comment: (NOTE) SARS-CoV-2 target nucleic acids are NOT DETECTED.  The SARS-CoV-2 RNA is generally detectable in upper respiratory specimens during the acute phase of infection. The lowest concentration of SARS-CoV-2 viral copies this assay can detect is 138 copies/mL. A negative result does not preclude SARS-Cov-2 infection and should not be used as the sole basis for treatment or other patient management decisions. A negative result may  occur with  improper specimen collection/handling, submission of specimen other than nasopharyngeal swab, presence of viral mutation(s) within the areas targeted by this assay, and inadequate number of viral copies(<138 copies/mL). A negative result must be combined with clinical observations, patient history, and epidemiological information. The expected result is Negative.  Fact Sheet for Patients:  BloggerCourse.com  Fact Sheet for Healthcare Providers:  SeriousBroker.it  This test is no t yet approved or cleared by the [place] FDA and  has been authorized for detection and/or diagnosis of SARS-CoV-2 by FDA under an Emergency Use Authorization (EUA). This EUA will remain  in effect (meaning this test can be used) for the duration of the COVID-19 declaration under Section 564(b)(1) of the Act, 21 U.S.C.section 360bbb-3(b)(1), unless the authorization is terminated  or revoked sooner.       Influenza A by PCR NEGATIVE NEGATIVE Final   Influenza B by  PCR NEGATIVE NEGATIVE Final    Comment: (NOTE) The Xpert Xpress SARS-CoV-2/FLU/RSV plus assay is intended as an aid in the diagnosis of influenza from Nasopharyngeal swab specimens and should not be used as a sole basis for treatment. Nasal washings and aspirates are unacceptable for Xpert Xpress SARS-CoV-2/FLU/RSV testing.  Fact Sheet for Patients: BloggerCourse.com  Fact Sheet for Healthcare Providers: SeriousBroker.it  This test is not yet approved or cleared by the [place] FDA and has been authorized for detection and/or diagnosis of SARS-CoV-2 by FDA under an Emergency Use Authorization (EUA). This EUA will remain in effect (meaning this test can be used) for the duration of the COVID-19 declaration under Section 564(b)(1) of the Act, 21 U.S.C. section 360bbb-3(b)(1), unless the authorization is terminated  or revoked.     Resp Syncytial Virus by PCR NEGATIVE NEGATIVE Final    Comment: (NOTE) Fact Sheet for Patients: BloggerCourse.com  Fact Sheet for Healthcare Providers: SeriousBroker.it  This test is not yet approved or cleared by the [place] FDA and has been authorized for detection and/or diagnosis of SARS-CoV-2 by FDA under an Emergency Use Authorization (EUA). This EUA will remain in effect (meaning this test can be used) for the duration of the COVID-19 declaration under Section 564(b)(1) of the Act, 21 U.S.C. section 360bbb-3(b)(1), unless the authorization is terminated or revoked.  Performed at [facility], [address] [place], [place] [address]     Labs: CBC: Recent Labs  Lab 12/05/22 1408  WBC 6.6  HGB 10.9*  HCT 33.8*  MCV 87.6  PLT 308   Basic Metabolic Panel: Recent Labs  Lab 12/05/22 1408  NA 134*  K 3.9  CL 104  CO2 23  GLUCOSE 95  BUN 17  CREATININE 1.16  CALCIUM 9.2   Liver Function Tests: No results for input(s): "AST", "ALT", "ALKPHOS", "BILITOT", "PROT", "ALBUMIN" in the last 168 hours. CBG: No results for input(s): "GLUCAP" in the last 168 hours.  Discharge time spent: greater than 30 minutes.  Signed: Marrion Coy, MD [facility] 12/07/2022

## 2022-12-09 ENCOUNTER — Telehealth: Payer: Self-pay | Admitting: *Deleted

## 2022-12-09 ENCOUNTER — Ambulatory Visit: Payer: Self-pay

## 2022-12-09 DIAGNOSIS — N400 Enlarged prostate without lower urinary tract symptoms: Secondary | ICD-10-CM | POA: Diagnosis not present

## 2022-12-09 DIAGNOSIS — I739 Peripheral vascular disease, unspecified: Secondary | ICD-10-CM | POA: Diagnosis not present

## 2022-12-09 DIAGNOSIS — K581 Irritable bowel syndrome with constipation: Secondary | ICD-10-CM | POA: Diagnosis not present

## 2022-12-09 DIAGNOSIS — Z7982 Long term (current) use of aspirin: Secondary | ICD-10-CM | POA: Diagnosis not present

## 2022-12-09 DIAGNOSIS — R296 Repeated falls: Secondary | ICD-10-CM | POA: Diagnosis not present

## 2022-12-09 DIAGNOSIS — G4733 Obstructive sleep apnea (adult) (pediatric): Secondary | ICD-10-CM | POA: Diagnosis not present

## 2022-12-09 DIAGNOSIS — Z7901 Long term (current) use of anticoagulants: Secondary | ICD-10-CM | POA: Diagnosis not present

## 2022-12-09 DIAGNOSIS — Z9181 History of falling: Secondary | ICD-10-CM | POA: Diagnosis not present

## 2022-12-09 DIAGNOSIS — G5602 Carpal tunnel syndrome, left upper limb: Secondary | ICD-10-CM | POA: Diagnosis not present

## 2022-12-09 DIAGNOSIS — N1832 Chronic kidney disease, stage 3b: Secondary | ICD-10-CM | POA: Diagnosis not present

## 2022-12-09 DIAGNOSIS — Z8546 Personal history of malignant neoplasm of prostate: Secondary | ICD-10-CM | POA: Diagnosis not present

## 2022-12-09 DIAGNOSIS — E669 Obesity, unspecified: Secondary | ICD-10-CM | POA: Diagnosis not present

## 2022-12-09 DIAGNOSIS — Z6830 Body mass index (BMI) 30.0-30.9, adult: Secondary | ICD-10-CM | POA: Diagnosis not present

## 2022-12-09 DIAGNOSIS — E785 Hyperlipidemia, unspecified: Secondary | ICD-10-CM | POA: Diagnosis not present

## 2022-12-09 DIAGNOSIS — I69393 Ataxia following cerebral infarction: Secondary | ICD-10-CM | POA: Diagnosis not present

## 2022-12-09 DIAGNOSIS — I69351 Hemiplegia and hemiparesis following cerebral infarction affecting right dominant side: Secondary | ICD-10-CM | POA: Diagnosis not present

## 2022-12-09 DIAGNOSIS — I639 Cerebral infarction, unspecified: Secondary | ICD-10-CM | POA: Diagnosis not present

## 2022-12-09 DIAGNOSIS — I2510 Atherosclerotic heart disease of native coronary artery without angina pectoris: Secondary | ICD-10-CM | POA: Diagnosis not present

## 2022-12-09 DIAGNOSIS — M25461 Effusion, right knee: Secondary | ICD-10-CM | POA: Diagnosis not present

## 2022-12-09 DIAGNOSIS — Z7902 Long term (current) use of antithrombotics/antiplatelets: Secondary | ICD-10-CM | POA: Diagnosis not present

## 2022-12-09 DIAGNOSIS — E871 Hypo-osmolality and hyponatremia: Secondary | ICD-10-CM | POA: Diagnosis not present

## 2022-12-09 DIAGNOSIS — I129 Hypertensive chronic kidney disease with stage 1 through stage 4 chronic kidney disease, or unspecified chronic kidney disease: Secondary | ICD-10-CM | POA: Diagnosis not present

## 2022-12-09 DIAGNOSIS — M5136 Other intervertebral disc degeneration, lumbar region: Secondary | ICD-10-CM | POA: Diagnosis not present

## 2022-12-09 DIAGNOSIS — M170 Bilateral primary osteoarthritis of knee: Secondary | ICD-10-CM | POA: Diagnosis not present

## 2022-12-09 DIAGNOSIS — H5509 Other forms of nystagmus: Secondary | ICD-10-CM | POA: Diagnosis not present

## 2022-12-09 NOTE — Transitions of Care (Post Inpatient/ED Visit) (Signed)
   12/09/2022  Name: Jared Cline MRN: [id number] DOB: 02/22/1946  Today's TOC FU Call Status: Today's TOC FU Call Status:: Unsuccessul Call (1st Attempt) Unsuccessful Call (1st Attempt) Date: 12/09/22  Attempted to reach the patient regarding the most recent Inpatient/ED visit.  Follow Up Plan: Additional outreach attempts will be made to reach the patient to complete the Transitions of Care (Post Inpatient/ED visit) call.   Gean Maidens BSN RN [facility] [phone]

## 2022-12-09 NOTE — Telephone Encounter (Signed)
     Chief Complaint: Thayer Ohm, PT with [facility] reports two Level 2 drug interactions :  Clonidine and Labetalol, Plavix and Eliquis Symptoms: n/a Frequency: n/a Pertinent Negatives: Patient denies n/a Disposition: ED /[] Urgent Care (no appt availability in office) / Appointment(In office/virtual)/  [facility]/ Home Care/ Refused Recommended Disposition /[] [place] [facility]/  Follow-up with PCP Additional Notes: n/a  Answer Assessment - Initial Assessment Questions 1. NAME of MEDICINE: "What medicine(s) are you calling about?"     Drug interactions - Clonidine and Labetalol, Plavix and Eliquis  2. QUESTION: "What is your question?" (e.g., double dose of medicine, side effect)     N/a 3. PRESCRIBER: "Who prescribed the medicine?" Reason: if prescribed by specialist, call should be referred to that group.     [place] 4. SYMPTOMS: "Do you have any symptoms?" If Yes, ask: "What symptoms are you having?"  "How bad are the symptoms (e.g., mild, moderate, severe)     N/a 5. PREGNANCY:  "Is there any chance that you are pregnant?" "When was your last menstrual period?"     N/a  Protocols used: Medication Question Call-A-AH

## 2022-12-09 NOTE — Telephone Encounter (Signed)
Copied from CRM [id number]. Topic: Quick Communication - Home Health Verbal Orders >> Dec 09, 2022  3:50 PM Everette C wrote: Caller/Agency: Thayer Ohm / [facility]  Callback Number:  [phone] Requesting OT/PT/Skilled Nursing/Social Work/Speech Therapy: PT Frequency: 2w1 1w8

## 2022-12-10 ENCOUNTER — Telehealth: Payer: Self-pay | Admitting: *Deleted

## 2022-12-10 NOTE — Telephone Encounter (Signed)
Chris notified.

## 2022-12-10 NOTE — Transitions of Care (Post Inpatient/ED Visit) (Signed)
   12/10/2022  Name: Jared Cline MRN: [id number] DOB: May 04, 1946  Today's TOC FU Call Status: Today's TOC FU Call Status:: Successful TOC FU Call Competed TOC FU Call Complete Date: 12/10/22  Transition Care Management Follow-up Telephone Call Discharge Facility: [facility]) Type of Discharge: Inpatient Admission Primary Inpatient Discharge Diagnosis:: Acute CVA (cerebrovascular accident How have you been since you were released from the hospital?: Better Any questions or concerns?: No  Items Reviewed: Did you receive and understand the discharge instructions provided?: Yes Medications obtained and verified?: Yes (Medications Reviewed) Any new allergies since your discharge?: No Dietary orders reviewed?: Yes Type of Diet Ordered:: low salt heart healthy Do you have support at home?: Yes People in Home: spouse Name of Support/Comfort Primary Source: [facility] and Equipment/Supplies: Were Home Health Services Ordered?: Yes Name of Home Health Agency:: [facility] Has Agency set up a time to come to your home?: Yes First Home Health Visit Date: 12/08/21 Any new equipment or medical supplies ordered?: No  Functional Questionnaire: Do you need assistance with bathing/showering or dressing?: No Do you need assistance with meal preparation?: Yes Do you need assistance with eating?: No Do you have difficulty maintaining continence: No Do you need assistance with getting out of bed/getting out of a chair/moving?: No Do you have difficulty managing or taking your medications?: No  Follow up appointments reviewed: Date of PCP follow-up appointment?: 12/20/22 Follow-up Provider: Della Goo [facility] Follow-up appointment confirmed?: Yes Date of Specialist follow-up appointment?: 12/11/22 Follow-Up Specialty Provider:: Neurology Do you need transportation to your follow-up appointment?: No Do you understand care options if your  condition(s) worsen?: Yes-patient verbalized understanding  SDOH Interventions Today    Flowsheet Row Most Recent Value  SDOH Interventions   Food Insecurity Interventions Intervention Not Indicated  Housing Interventions Intervention Not Indicated  Transportation Interventions Intervention Not Indicated      Interventions Today    Flowsheet Row Most Recent Value  General Interventions   General Interventions Discussed/Reviewed General Interventions Discussed, General Interventions Reviewed, Doctor Visits  Doctor Visits Discussed/Reviewed Doctor Visits Discussed, Doctor Visits Reviewed  PCP/Specialist Visits Compliance with follow-up visit  Education Interventions   Provided Verbal Education On Nutrition  [discussed low sodium heart healthy diet]  Nutrition Interventions   Nutrition Discussed/Reviewed Nutrition Discussed, Nutrition Reviewed, Fluid intake      TOC Interventions Today    Flowsheet Row Most Recent Value  TOC Interventions   TOC Interventions Discussed/Reviewed TOC Interventions Discussed, TOC Interventions Reviewed, Arranged PCP follow up less than 12 days/Care Guide scheduled       Gean Maidens BSN RN [facility] [phone]

## 2022-12-11 DIAGNOSIS — M170 Bilateral primary osteoarthritis of knee: Secondary | ICD-10-CM | POA: Diagnosis not present

## 2022-12-11 DIAGNOSIS — E669 Obesity, unspecified: Secondary | ICD-10-CM | POA: Diagnosis not present

## 2022-12-11 DIAGNOSIS — G4733 Obstructive sleep apnea (adult) (pediatric): Secondary | ICD-10-CM | POA: Diagnosis not present

## 2022-12-11 DIAGNOSIS — I2510 Atherosclerotic heart disease of native coronary artery without angina pectoris: Secondary | ICD-10-CM | POA: Diagnosis not present

## 2022-12-11 DIAGNOSIS — I739 Peripheral vascular disease, unspecified: Secondary | ICD-10-CM | POA: Diagnosis not present

## 2022-12-11 DIAGNOSIS — I129 Hypertensive chronic kidney disease with stage 1 through stage 4 chronic kidney disease, or unspecified chronic kidney disease: Secondary | ICD-10-CM | POA: Diagnosis not present

## 2022-12-11 DIAGNOSIS — N1832 Chronic kidney disease, stage 3b: Secondary | ICD-10-CM | POA: Diagnosis not present

## 2022-12-11 DIAGNOSIS — M25461 Effusion, right knee: Secondary | ICD-10-CM | POA: Diagnosis not present

## 2022-12-11 DIAGNOSIS — Z6830 Body mass index (BMI) 30.0-30.9, adult: Secondary | ICD-10-CM | POA: Diagnosis not present

## 2022-12-11 DIAGNOSIS — G5602 Carpal tunnel syndrome, left upper limb: Secondary | ICD-10-CM | POA: Diagnosis not present

## 2022-12-11 DIAGNOSIS — E871 Hypo-osmolality and hyponatremia: Secondary | ICD-10-CM | POA: Diagnosis not present

## 2022-12-11 DIAGNOSIS — K581 Irritable bowel syndrome with constipation: Secondary | ICD-10-CM | POA: Diagnosis not present

## 2022-12-11 DIAGNOSIS — E785 Hyperlipidemia, unspecified: Secondary | ICD-10-CM | POA: Diagnosis not present

## 2022-12-11 DIAGNOSIS — N400 Enlarged prostate without lower urinary tract symptoms: Secondary | ICD-10-CM | POA: Diagnosis not present

## 2022-12-11 DIAGNOSIS — I69393 Ataxia following cerebral infarction: Secondary | ICD-10-CM | POA: Diagnosis not present

## 2022-12-11 DIAGNOSIS — I69351 Hemiplegia and hemiparesis following cerebral infarction affecting right dominant side: Secondary | ICD-10-CM | POA: Diagnosis not present

## 2022-12-12 DIAGNOSIS — I639 Cerebral infarction, unspecified: Secondary | ICD-10-CM | POA: Diagnosis not present

## 2022-12-17 DIAGNOSIS — N1832 Chronic kidney disease, stage 3b: Secondary | ICD-10-CM | POA: Diagnosis not present

## 2022-12-17 DIAGNOSIS — E785 Hyperlipidemia, unspecified: Secondary | ICD-10-CM | POA: Diagnosis not present

## 2022-12-17 DIAGNOSIS — M25461 Effusion, right knee: Secondary | ICD-10-CM | POA: Diagnosis not present

## 2022-12-17 DIAGNOSIS — I2510 Atherosclerotic heart disease of native coronary artery without angina pectoris: Secondary | ICD-10-CM | POA: Diagnosis not present

## 2022-12-17 DIAGNOSIS — E669 Obesity, unspecified: Secondary | ICD-10-CM | POA: Diagnosis not present

## 2022-12-17 DIAGNOSIS — K581 Irritable bowel syndrome with constipation: Secondary | ICD-10-CM | POA: Diagnosis not present

## 2022-12-17 DIAGNOSIS — I129 Hypertensive chronic kidney disease with stage 1 through stage 4 chronic kidney disease, or unspecified chronic kidney disease: Secondary | ICD-10-CM | POA: Diagnosis not present

## 2022-12-17 DIAGNOSIS — E871 Hypo-osmolality and hyponatremia: Secondary | ICD-10-CM | POA: Diagnosis not present

## 2022-12-17 DIAGNOSIS — Z6830 Body mass index (BMI) 30.0-30.9, adult: Secondary | ICD-10-CM | POA: Diagnosis not present

## 2022-12-17 DIAGNOSIS — I69351 Hemiplegia and hemiparesis following cerebral infarction affecting right dominant side: Secondary | ICD-10-CM | POA: Diagnosis not present

## 2022-12-17 DIAGNOSIS — I69393 Ataxia following cerebral infarction: Secondary | ICD-10-CM | POA: Diagnosis not present

## 2022-12-17 DIAGNOSIS — G4733 Obstructive sleep apnea (adult) (pediatric): Secondary | ICD-10-CM | POA: Diagnosis not present

## 2022-12-17 DIAGNOSIS — I739 Peripheral vascular disease, unspecified: Secondary | ICD-10-CM | POA: Diagnosis not present

## 2022-12-17 DIAGNOSIS — G5602 Carpal tunnel syndrome, left upper limb: Secondary | ICD-10-CM | POA: Diagnosis not present

## 2022-12-17 DIAGNOSIS — M170 Bilateral primary osteoarthritis of knee: Secondary | ICD-10-CM | POA: Diagnosis not present

## 2022-12-17 DIAGNOSIS — N400 Enlarged prostate without lower urinary tract symptoms: Secondary | ICD-10-CM | POA: Diagnosis not present

## 2022-12-19 NOTE — Progress Notes (Signed)
BP (!) 142/78   Pulse 88   Temp 97.9 F (36.6 C) (Oral)   Resp 16   Ht 6' (1.829 m)   Wt 224 lb 4.8 oz (101.7 kg)   SpO2 98%   BMI 30.42 kg/m    Subjective:    Patient ID: Jared Cline, male    DOB: 08/09/1946, 77 y.o.   MRN: [id number]  HPI: Jared Cline is a 77 y.o. male  Chief Complaint  Patient presents with   Hospitalization Follow-up   Hospital follow up/CVA: admitted to [facility] on 12/05/2022 and discharged on 12/07/2022.  He was sent to er on 12/05/2022 for headache and dizziness.  MRI showed Punctate acute infarcts in the anterior right frontal lobe subcortical white matter. Patient had an appointment with neurosurgery on 12/12/2022.  They are scheduling him for dynamic angiogram to determine next course of treatment.  He is scheduled for 01/27/2023. He says he is getting around pretty good with his walker.  He denies any more episodes of dizziness. He continues to have lower extremity edema.  He did receive lasix in the hospital which helped. Will start him on lasix. Also discussed with patient to talk to vascular regarding his leg swelling.   Notes from hospital stay: Hospital Course: Jared Cline is a 77 y.o. male with medical history significant of recurrent CVA with his first CVA in January of this year, BPH, chronic kidney disease stage 2, herniated lumbar disc, essential hypertension, hyperlipidemia, history of prostate cancer, obstructive sleep apnea and symptomatic bradycardia who presents to the ER with dizziness and weakness. MRI showed punctate acute infarcts in the anterior right frontal lobe subcortical white matter. Neurology consult obtained.  No change patient treatment regimen, stroke is very small.  Patient was kept in the hospital for another day due to orthostasis, he received fluid bolus yesterday.  Currently he becomes asymptomatic, restart blood pressure medicine at discharge.   Assessment and Plan:   Recurrent CVA. Right carotid artery stenosis status post  stent. Dyslipidemia.  LDL at goal. Patient currently taking aspirin and Eliquis.  Had recurrent stroke at admission.  He had a right carotid artery stenting earlier this months.  His LDL is at goal at 68. Patient has been seen by neurology, he is already on maximal treatment with aspirin, Plavix and Eliquis.  He is also on atorvastatin.  No additional treatment is available.  Patient has been seen by PT/OT, recommended home with treatment.   Near syncope. Patient had a significant drop of blood pressure and dizziness after receiving Lasix.  Discontinue Lasix, received IV fluid bolus.  Condition had improved.   Bilateral leg edema. Edema much improved after giving Lasix, patient has significant leg cramping afterwards.  BNP 193, no volume overload.  Echocardiogram ejection fraction of 60 to 65% with grade 1 diastolic dysfunction.  No evidence of congestive heart failure acute exacerbation this time. Duplex ultrasound did not show any DVT. Does not appear to be arterial as patient has no pain, no evidence of ischemia. Edema is better today after initial Lasix treatment.   Essential hypertension. Blood pressure medicine was on hold for permissive hypertension at time of admission.  Will gradually restart home medicines, hold ARB for 2 days and restarted afterwards.   Chronic kidney disease stage II. I reviewed the chart, patient condition is consistent with stage II not stage III.   History of prostate cancer. Follow-up with PCP as outpatient.   Obstruct sleep apnea. CPAP while asleep.   Coronary artery  disease. Continue home medicines.     Relevant past medical, surgical, family and social history reviewed and updated as indicated. Interim medical history since our last visit reviewed. Allergies and medications reviewed and updated.  Review of Systems  Constitutional: Negative for fever or weight change.  Respiratory: Negative for cough and shortness of breath.   Cardiovascular:  Negative for chest pain or palpitations.  Gastrointestinal: Negative for abdominal pain, no bowel changes.  Musculoskeletal:positive for gait problem ( uses walker) or joint swelling.  Skin: Negative for rash.  Neurological: Negative for dizziness or headache.  No other specific complaints in a complete review of systems (except as listed in HPI above).      Objective:    BP (!) 142/78   Pulse 88   Temp 97.9 F (36.6 C) (Oral)   Resp 16   Ht 6' (1.829 m)   Wt 224 lb 4.8 oz (101.7 kg)   SpO2 98%   BMI 30.42 kg/m   Wt Readings from Last 3 Encounters:  12/20/22 224 lb 4.8 oz (101.7 kg)  12/07/22 212 lb 9.6 oz (96.4 kg)  12/05/22 225 lb 3.2 oz (102.2 kg)    Physical Exam  Constitutional: Patient appears well-developed and well-nourished. Obese  No distress.  HEENT: head atraumatic, normocephalic, pupils equal and reactive to light, neck supple Cardiovascular: Normal rate, regular rhythm and normal heart sounds.  No murmur heard. 1+ BLE edema. Pulmonary/Chest: Effort normal and breath sounds normal. No respiratory distress. Abdominal: Soft.  There is no tenderness. Psychiatric: Patient has a normal mood and affect. behavior is normal. Judgment and thought content normal.  Results for orders placed or performed during the hospital encounter of 12/05/22  Basic metabolic panel  Result Value Ref Range   Sodium 134 (L) 135 - 145 mmol/L   Potassium 3.9 3.5 - 5.1 mmol/L   Chloride 104 98 - 111 mmol/L   CO2 23 22 - 32 mmol/L   Glucose, Bld 95 70 - 99 mg/dL   BUN 17 8 - 23 mg/dL   Creatinine, Ser 2.95 0.61 - 1.24 mg/dL   Calcium 9.2 8.9 - 62.1 mg/dL   GFR, Estimated >30 >86 mL/min   Anion gap 7 5 - 15  CBC  Result Value Ref Range   WBC 6.6 4.0 - 10.5 K/uL   RBC 3.86 (L) 4.22 - 5.81 MIL/uL   Hemoglobin 10.9 (L) 13.0 - 17.0 g/dL   HCT 57.8 (L) 46.9 - 62.9 %   MCV 87.6 80.0 - 100.0 fL   MCH 28.2 26.0 - 34.0 pg   MCHC 32.2 30.0 - 36.0 g/dL   RDW 52.8 41.3 - 24.4 %   Platelets  308 150 - 400 K/uL   nRBC 0.0 0.0 - 0.2 %  Urinalysis, Routine w reflex microscopic -Urine, Clean Catch  Result Value Ref Range   Color, Urine YELLOW (A) YELLOW   APPearance CLEAR (A) CLEAR   Specific Gravity, Urine 1.018 1.005 - 1.030   pH 5.0 5.0 - 8.0   Glucose, UA NEGATIVE NEGATIVE mg/dL   Hgb urine dipstick NEGATIVE NEGATIVE   Bilirubin Urine NEGATIVE NEGATIVE   Ketones, ur NEGATIVE NEGATIVE mg/dL   Protein, ur NEGATIVE NEGATIVE mg/dL   Nitrite NEGATIVE NEGATIVE   Leukocytes,Ua NEGATIVE NEGATIVE  Lipid panel  Result Value Ref Range   Cholesterol 133 0 - 200 mg/dL   Triglycerides 83 <010 mg/dL   HDL 48 >27 mg/dL   Total CHOL/HDL Ratio 2.8 RATIO   VLDL 17 0 -  40 mg/dL   LDL Cholesterol 68 0 - 99 mg/dL  Brain natriuretic peptide  Result Value Ref Range   B Natriuretic Peptide 193.9 (H) 0.0 - 100.0 pg/mL  ECHOCARDIOGRAM COMPLETE  Result Value Ref Range   Weight 3,603.2 oz   Height 72 in   BP 152/58 mmHg   Ao pk vel 1.49 m/s   AV Area VTI 3.44 cm2   AR max vel 3.18 cm2   AV Mean grad 5.5 mmHg   AV Peak grad 8.9 mmHg   S' Lateral 2.90 cm   AV Area mean vel 3.00 cm2   Area-P 1/2 2.31 cm2   MV VTI 3.56 cm2   Est EF 60 - 65%   Troponin I (High Sensitivity)  Result Value Ref Range   Troponin I (High Sensitivity) 6 <18 ng/L  Troponin I (High Sensitivity)  Result Value Ref Range   Troponin I (High Sensitivity) 5 <18 ng/L      Assessment & Plan:   Problem List Items Addressed This Visit       Other   History of CVA (cerebrovascular accident)    admitted to [facility] on 12/05/2022 and discharged on 12/07/2022.  He was sent to er on 12/05/2022 for headache and dizziness.  MRI showed Punctate acute infarcts in the anterior right frontal lobe subcortical white matter. Patient had an appointment with neurosurgery on 12/12/2022.  They are scheduling him for dynamic angiogram to determine next course of treatment.  He is scheduled for 01/27/2023. He says he is getting around pretty  good with his walker.  He denies any more episodes of dizziness.      Other Visit Diagnoses     Lower extremity edema    -  Primary   start lasix 20 mg daily,  elevate legs, wear compression socks follow up with vascular   Relevant Medications   furosemide (LASIX) 20 MG tablet   Hospital discharge follow-up       doing well since discharge,  still has lower extermity edema, will start lasix.  he has seen neurosurgery on 12/12/2022. scheduling him for dynamic angiogram        Follow up plan: Return in about 4 weeks (around 01/17/2023) for follow up.

## 2022-12-20 ENCOUNTER — Other Ambulatory Visit: Payer: Self-pay

## 2022-12-20 ENCOUNTER — Encounter: Payer: Self-pay | Admitting: Nurse Practitioner

## 2022-12-20 ENCOUNTER — Ambulatory Visit (INDEPENDENT_AMBULATORY_CARE_PROVIDER_SITE_OTHER): Payer: Medicare Other | Admitting: Nurse Practitioner

## 2022-12-20 VITALS — BP 142/78 | HR 88 | Temp 97.9°F | Resp 16 | Ht 72.0 in | Wt 224.3 lb

## 2022-12-20 DIAGNOSIS — R600 Localized edema: Secondary | ICD-10-CM | POA: Diagnosis not present

## 2022-12-20 DIAGNOSIS — Z09 Encounter for follow-up examination after completed treatment for conditions other than malignant neoplasm: Secondary | ICD-10-CM | POA: Diagnosis not present

## 2022-12-20 DIAGNOSIS — Z8673 Personal history of transient ischemic attack (TIA), and cerebral infarction without residual deficits: Secondary | ICD-10-CM | POA: Diagnosis not present

## 2022-12-20 MED ORDER — FUROSEMIDE 20 MG PO TABS
20.0000 mg | ORAL_TABLET | Freq: Every day | ORAL | 0 refills | Status: DC
Start: 2022-12-20 — End: 2023-01-22

## 2022-12-20 NOTE — Assessment & Plan Note (Signed)
admitted to [facility] on 12/05/2022 and discharged on 12/07/2022.  He was sent to er on 12/05/2022 for headache and dizziness.  MRI showed Punctate acute infarcts in the anterior right frontal lobe subcortical white matter. Patient had an appointment with neurosurgery on 12/12/2022.  They are scheduling him for dynamic angiogram to determine next course of treatment.  He is scheduled for 01/27/2023. He says he is getting around pretty good with his walker.  He denies any more episodes of dizziness.

## 2022-12-23 DIAGNOSIS — G4733 Obstructive sleep apnea (adult) (pediatric): Secondary | ICD-10-CM | POA: Diagnosis not present

## 2022-12-23 DIAGNOSIS — E785 Hyperlipidemia, unspecified: Secondary | ICD-10-CM | POA: Diagnosis not present

## 2022-12-23 DIAGNOSIS — N400 Enlarged prostate without lower urinary tract symptoms: Secondary | ICD-10-CM | POA: Diagnosis not present

## 2022-12-23 DIAGNOSIS — E871 Hypo-osmolality and hyponatremia: Secondary | ICD-10-CM | POA: Diagnosis not present

## 2022-12-23 DIAGNOSIS — I2510 Atherosclerotic heart disease of native coronary artery without angina pectoris: Secondary | ICD-10-CM | POA: Diagnosis not present

## 2022-12-23 DIAGNOSIS — N1832 Chronic kidney disease, stage 3b: Secondary | ICD-10-CM | POA: Diagnosis not present

## 2022-12-23 DIAGNOSIS — M170 Bilateral primary osteoarthritis of knee: Secondary | ICD-10-CM | POA: Diagnosis not present

## 2022-12-23 DIAGNOSIS — E669 Obesity, unspecified: Secondary | ICD-10-CM | POA: Diagnosis not present

## 2022-12-23 DIAGNOSIS — Z6830 Body mass index (BMI) 30.0-30.9, adult: Secondary | ICD-10-CM | POA: Diagnosis not present

## 2022-12-23 DIAGNOSIS — K581 Irritable bowel syndrome with constipation: Secondary | ICD-10-CM | POA: Diagnosis not present

## 2022-12-23 DIAGNOSIS — I69393 Ataxia following cerebral infarction: Secondary | ICD-10-CM | POA: Diagnosis not present

## 2022-12-23 DIAGNOSIS — G5602 Carpal tunnel syndrome, left upper limb: Secondary | ICD-10-CM | POA: Diagnosis not present

## 2022-12-23 DIAGNOSIS — M25461 Effusion, right knee: Secondary | ICD-10-CM | POA: Diagnosis not present

## 2022-12-23 DIAGNOSIS — I739 Peripheral vascular disease, unspecified: Secondary | ICD-10-CM | POA: Diagnosis not present

## 2022-12-23 DIAGNOSIS — I129 Hypertensive chronic kidney disease with stage 1 through stage 4 chronic kidney disease, or unspecified chronic kidney disease: Secondary | ICD-10-CM | POA: Diagnosis not present

## 2022-12-23 DIAGNOSIS — I69351 Hemiplegia and hemiparesis following cerebral infarction affecting right dominant side: Secondary | ICD-10-CM | POA: Diagnosis not present

## 2022-12-24 DIAGNOSIS — I2510 Atherosclerotic heart disease of native coronary artery without angina pectoris: Secondary | ICD-10-CM | POA: Diagnosis not present

## 2022-12-24 DIAGNOSIS — K581 Irritable bowel syndrome with constipation: Secondary | ICD-10-CM | POA: Diagnosis not present

## 2022-12-24 DIAGNOSIS — N1832 Chronic kidney disease, stage 3b: Secondary | ICD-10-CM | POA: Diagnosis not present

## 2022-12-24 DIAGNOSIS — M25461 Effusion, right knee: Secondary | ICD-10-CM | POA: Diagnosis not present

## 2022-12-24 DIAGNOSIS — E785 Hyperlipidemia, unspecified: Secondary | ICD-10-CM | POA: Diagnosis not present

## 2022-12-24 DIAGNOSIS — M170 Bilateral primary osteoarthritis of knee: Secondary | ICD-10-CM | POA: Diagnosis not present

## 2022-12-24 DIAGNOSIS — Z6830 Body mass index (BMI) 30.0-30.9, adult: Secondary | ICD-10-CM | POA: Diagnosis not present

## 2022-12-24 DIAGNOSIS — I739 Peripheral vascular disease, unspecified: Secondary | ICD-10-CM | POA: Diagnosis not present

## 2022-12-24 DIAGNOSIS — E871 Hypo-osmolality and hyponatremia: Secondary | ICD-10-CM | POA: Diagnosis not present

## 2022-12-24 DIAGNOSIS — G4733 Obstructive sleep apnea (adult) (pediatric): Secondary | ICD-10-CM | POA: Diagnosis not present

## 2022-12-24 DIAGNOSIS — G5602 Carpal tunnel syndrome, left upper limb: Secondary | ICD-10-CM | POA: Diagnosis not present

## 2022-12-24 DIAGNOSIS — I69393 Ataxia following cerebral infarction: Secondary | ICD-10-CM | POA: Diagnosis not present

## 2022-12-24 DIAGNOSIS — E669 Obesity, unspecified: Secondary | ICD-10-CM | POA: Diagnosis not present

## 2022-12-24 DIAGNOSIS — I129 Hypertensive chronic kidney disease with stage 1 through stage 4 chronic kidney disease, or unspecified chronic kidney disease: Secondary | ICD-10-CM | POA: Diagnosis not present

## 2022-12-24 DIAGNOSIS — N400 Enlarged prostate without lower urinary tract symptoms: Secondary | ICD-10-CM | POA: Diagnosis not present

## 2022-12-24 DIAGNOSIS — I69351 Hemiplegia and hemiparesis following cerebral infarction affecting right dominant side: Secondary | ICD-10-CM | POA: Diagnosis not present

## 2022-12-25 ENCOUNTER — Other Ambulatory Visit (INDEPENDENT_AMBULATORY_CARE_PROVIDER_SITE_OTHER): Payer: Self-pay | Admitting: Vascular Surgery

## 2022-12-25 DIAGNOSIS — I6523 Occlusion and stenosis of bilateral carotid arteries: Secondary | ICD-10-CM

## 2022-12-30 ENCOUNTER — Ambulatory Visit (INDEPENDENT_AMBULATORY_CARE_PROVIDER_SITE_OTHER): Payer: Medicare Other

## 2022-12-30 ENCOUNTER — Ambulatory Visit (INDEPENDENT_AMBULATORY_CARE_PROVIDER_SITE_OTHER): Payer: Medicare Other | Admitting: Vascular Surgery

## 2022-12-30 ENCOUNTER — Ambulatory Visit: Payer: Medicare Other | Admitting: Nurse Practitioner

## 2022-12-30 ENCOUNTER — Encounter (INDEPENDENT_AMBULATORY_CARE_PROVIDER_SITE_OTHER): Payer: Self-pay | Admitting: Vascular Surgery

## 2022-12-30 VITALS — BP 159/69 | HR 59 | Resp 18 | Ht 72.0 in | Wt 226.2 lb

## 2022-12-30 DIAGNOSIS — I6523 Occlusion and stenosis of bilateral carotid arteries: Secondary | ICD-10-CM

## 2022-12-30 DIAGNOSIS — I6521 Occlusion and stenosis of right carotid artery: Secondary | ICD-10-CM

## 2022-12-31 DIAGNOSIS — I739 Peripheral vascular disease, unspecified: Secondary | ICD-10-CM | POA: Diagnosis not present

## 2022-12-31 DIAGNOSIS — G5602 Carpal tunnel syndrome, left upper limb: Secondary | ICD-10-CM | POA: Diagnosis not present

## 2022-12-31 DIAGNOSIS — M25461 Effusion, right knee: Secondary | ICD-10-CM | POA: Diagnosis not present

## 2022-12-31 DIAGNOSIS — N400 Enlarged prostate without lower urinary tract symptoms: Secondary | ICD-10-CM | POA: Diagnosis not present

## 2022-12-31 DIAGNOSIS — E785 Hyperlipidemia, unspecified: Secondary | ICD-10-CM | POA: Diagnosis not present

## 2022-12-31 DIAGNOSIS — N1832 Chronic kidney disease, stage 3b: Secondary | ICD-10-CM | POA: Diagnosis not present

## 2022-12-31 DIAGNOSIS — Z6830 Body mass index (BMI) 30.0-30.9, adult: Secondary | ICD-10-CM | POA: Diagnosis not present

## 2022-12-31 DIAGNOSIS — M170 Bilateral primary osteoarthritis of knee: Secondary | ICD-10-CM | POA: Diagnosis not present

## 2022-12-31 DIAGNOSIS — I69393 Ataxia following cerebral infarction: Secondary | ICD-10-CM | POA: Diagnosis not present

## 2022-12-31 DIAGNOSIS — E669 Obesity, unspecified: Secondary | ICD-10-CM | POA: Diagnosis not present

## 2022-12-31 DIAGNOSIS — I69351 Hemiplegia and hemiparesis following cerebral infarction affecting right dominant side: Secondary | ICD-10-CM | POA: Diagnosis not present

## 2022-12-31 DIAGNOSIS — I2510 Atherosclerotic heart disease of native coronary artery without angina pectoris: Secondary | ICD-10-CM | POA: Diagnosis not present

## 2022-12-31 DIAGNOSIS — K581 Irritable bowel syndrome with constipation: Secondary | ICD-10-CM | POA: Diagnosis not present

## 2022-12-31 DIAGNOSIS — E871 Hypo-osmolality and hyponatremia: Secondary | ICD-10-CM | POA: Diagnosis not present

## 2022-12-31 DIAGNOSIS — G4733 Obstructive sleep apnea (adult) (pediatric): Secondary | ICD-10-CM | POA: Diagnosis not present

## 2022-12-31 DIAGNOSIS — I129 Hypertensive chronic kidney disease with stage 1 through stage 4 chronic kidney disease, or unspecified chronic kidney disease: Secondary | ICD-10-CM | POA: Diagnosis not present

## 2023-01-03 DIAGNOSIS — I129 Hypertensive chronic kidney disease with stage 1 through stage 4 chronic kidney disease, or unspecified chronic kidney disease: Secondary | ICD-10-CM | POA: Diagnosis not present

## 2023-01-03 DIAGNOSIS — I69351 Hemiplegia and hemiparesis following cerebral infarction affecting right dominant side: Secondary | ICD-10-CM | POA: Diagnosis not present

## 2023-01-03 DIAGNOSIS — I69393 Ataxia following cerebral infarction: Secondary | ICD-10-CM | POA: Diagnosis not present

## 2023-01-03 DIAGNOSIS — I739 Peripheral vascular disease, unspecified: Secondary | ICD-10-CM | POA: Diagnosis not present

## 2023-01-03 DIAGNOSIS — G4733 Obstructive sleep apnea (adult) (pediatric): Secondary | ICD-10-CM | POA: Diagnosis not present

## 2023-01-03 DIAGNOSIS — G5602 Carpal tunnel syndrome, left upper limb: Secondary | ICD-10-CM | POA: Diagnosis not present

## 2023-01-03 DIAGNOSIS — K581 Irritable bowel syndrome with constipation: Secondary | ICD-10-CM | POA: Diagnosis not present

## 2023-01-03 DIAGNOSIS — E871 Hypo-osmolality and hyponatremia: Secondary | ICD-10-CM | POA: Diagnosis not present

## 2023-01-03 DIAGNOSIS — M25461 Effusion, right knee: Secondary | ICD-10-CM | POA: Diagnosis not present

## 2023-01-03 DIAGNOSIS — N400 Enlarged prostate without lower urinary tract symptoms: Secondary | ICD-10-CM | POA: Diagnosis not present

## 2023-01-03 DIAGNOSIS — M170 Bilateral primary osteoarthritis of knee: Secondary | ICD-10-CM | POA: Diagnosis not present

## 2023-01-03 DIAGNOSIS — E785 Hyperlipidemia, unspecified: Secondary | ICD-10-CM | POA: Diagnosis not present

## 2023-01-03 DIAGNOSIS — N1832 Chronic kidney disease, stage 3b: Secondary | ICD-10-CM | POA: Diagnosis not present

## 2023-01-03 DIAGNOSIS — Z6830 Body mass index (BMI) 30.0-30.9, adult: Secondary | ICD-10-CM | POA: Diagnosis not present

## 2023-01-03 DIAGNOSIS — E669 Obesity, unspecified: Secondary | ICD-10-CM | POA: Diagnosis not present

## 2023-01-03 DIAGNOSIS — I2510 Atherosclerotic heart disease of native coronary artery without angina pectoris: Secondary | ICD-10-CM | POA: Diagnosis not present

## 2023-01-05 ENCOUNTER — Encounter (INDEPENDENT_AMBULATORY_CARE_PROVIDER_SITE_OTHER): Payer: Self-pay | Admitting: Vascular Surgery

## 2023-01-05 NOTE — Progress Notes (Signed)
Patient ID: Jared Cline, male   DOB: 05/29/1946, 77 y.o.   MRN: [id number]  Chief Complaint  Patient presents with   Follow-up    4 week follow up with carotid    HPI Jared Cline is a 77 y.o. male.    Patient returns to the office status post right carotid stent placement November 26, 2022.  He is at his neurologic baseline.  There have been no changes.  He denies other problems.   Past Medical History:  Diagnosis Date   BPH (benign prostatic hypertrophy)    Bradycardia    evaluated by Dr. Gwen Pounds   Cancer [facility])    prostate   Carpal tunnel syndrome, left    left arm   CKD (chronic kidney disease) stage 2, GFR 60-89 ml/min    Diverticulitis    Gastritis 2017   History of prostate cancer    HNP (herniated nucleus pulposus), lumbar    Hyperlipidemia    Hypertension    IBS (irritable bowel syndrome)    Impotence    Multiple lung nodules    nonspecific, largest 5mm in Jan 2016; scanned March 2017; rescan 3-6 months later   OA (osteoarthritis) of knee    right   Renal mass    evaluated; followed by urologist   Sleep apnea    on CPAP   Stroke (HCC)    Torn meniscus    x 2; right knee    Past Surgical History:  Procedure Laterality Date   abdominal ultrasound  Oct 2015   Negative for eval for AAA   CAROTID PTA/STENT INTERVENTION Right 11/26/2022   Procedure: CAROTID PTA/STENT INTERVENTION;  Surgeon: Renford Dills, MD;  Location: [facility] INVASIVE CV LAB;  Service: Cardiovascular;  Laterality: Right;   COLONOSCOPY     KNEE ARTHROSCOPY Right    x 2   LUMBAR LAMINECTOMY     PROSTATECTOMY     robotic      Allergies  Allergen Reactions   Flomax [Tamsulosin Hcl] Shortness Of Breath   Ace Inhibitors Other (See Comments)    Angioedema   Amlodipine Other (See Comments)    Angioedema   Doxazosin    Lipitor [Atorvastatin] Other (See Comments)    Memory loss    Current Outpatient Medications  Medication Sig Dispense Refill   acetaminophen (TYLENOL) 650 MG CR  tablet Take 2 tablets (1,300 mg total) by mouth every 12 (twelve) hours as needed (Mild pain).     apixaban (ELIQUIS) 5 MG TABS tablet Take 1 tablet (5 mg total) by mouth 2 (two) times daily. 60 tablet 3   aspirin EC 81 MG tablet Take 1 tablet (81 mg total) by mouth daily at 6 (six) AM. Swallow whole. 30 tablet 11   cholecalciferol (VITAMIN D3) 25 MCG (1000 UNIT) tablet Take 1,000 Units by mouth daily.     CINNAMON PO Take by mouth.     cloNIDine (CATAPRES) 0.2 MG tablet Take 0.2 mg by mouth 2 (two) times daily.     clopidogrel (PLAVIX) 75 MG tablet Take 1 tablet (75 mg total) by mouth daily. 30 tablet 11   cyanocobalamin (VITAMIN B12) 1000 MCG tablet Take 1,000 mcg by mouth daily.     diclofenac Sodium (VOLTAREN) 1 % GEL Apply 2 g topically 4 (four) times daily. 400 g 0   furosemide (LASIX) 20 MG tablet Take 1 tablet (20 mg total) by mouth daily. 30 tablet 0   GARLIC PO Take by mouth.  hydrALAZINE (APRESOLINE) 10 MG tablet TAKE 1 TABLET BY MOUTH TWICE DAILY AS NEEDED; TAKE IF BLOOD PRESSURE GREATER THAN 140/ 90 90 tablet 1   labetalol (NORMODYNE) 100 MG tablet 100 mg 2 (two) times daily.     pravastatin (PRAVACHOL) 80 MG tablet Take 1 tablet (80 mg total) by mouth at bedtime. 90 tablet 1   senna-docusate (SENOKOT-S) 8.6-50 MG tablet Take 3 tablets by mouth daily at 6 (six) AM. 90 tablet 0   TURMERIC PO Take by mouth.     valsartan (DIOVAN) 160 MG tablet Take 1 tablet (160 mg total) by mouth daily.     No current facility-administered medications for this visit.        Physical Exam BP (!) 159/69 (BP Location: Left Arm)   Pulse (!) 59   Resp 18   Ht 6' (1.829 m)   Wt 226 lb 3.2 oz (102.6 kg)   BMI 30.68 kg/m  Gen:  WD/WN, NAD Skin: incision C/D/I neurologically at his baseline     Assessment/Plan: 1. Carotid artery stenosis, symptomatic, right Recommend:  The patient is s/p successful right carotid stent  Duplex ultrasound  shows widely patent right carotid stent, <30%  stenosis.  Continue antiplatelet therapy as prescribed Continue management of CAD, HTN and Hyperlipidemia Healthy heart diet,  encouraged exercise at least 4 times per week  The patient's NIHSS score is as follows: 6 (unchanged from pre-stent) Mild: 1 - 5 Mild to Moderately Severe: 5 - 14 Severe: 15 - 24 Very Severe: >25  Follow up in 6 months with duplex ultrasound and physical exam based on the patient's carotid intervention. - VAS US CAROTID; Future      Levora Dredge 01/05/2023, 2:30 PM   This note was created with Dragon medical transcription system.  Any errors from dictation are unintentional.

## 2023-01-06 ENCOUNTER — Other Ambulatory Visit: Payer: Self-pay | Admitting: Nurse Practitioner

## 2023-01-06 DIAGNOSIS — G4733 Obstructive sleep apnea (adult) (pediatric): Secondary | ICD-10-CM | POA: Diagnosis not present

## 2023-01-07 ENCOUNTER — Encounter: Payer: Self-pay | Admitting: Physician Assistant

## 2023-01-07 ENCOUNTER — Ambulatory Visit: Payer: Self-pay | Admitting: *Deleted

## 2023-01-07 ENCOUNTER — Ambulatory Visit (INDEPENDENT_AMBULATORY_CARE_PROVIDER_SITE_OTHER): Payer: Medicare Other | Admitting: Physician Assistant

## 2023-01-07 VITALS — BP 154/78 | HR 87 | Temp 98.7°F | Resp 18 | Ht 72.0 in | Wt 227.0 lb

## 2023-01-07 DIAGNOSIS — I2510 Atherosclerotic heart disease of native coronary artery without angina pectoris: Secondary | ICD-10-CM | POA: Diagnosis not present

## 2023-01-07 DIAGNOSIS — N1832 Chronic kidney disease, stage 3b: Secondary | ICD-10-CM | POA: Diagnosis not present

## 2023-01-07 DIAGNOSIS — G5602 Carpal tunnel syndrome, left upper limb: Secondary | ICD-10-CM | POA: Diagnosis not present

## 2023-01-07 DIAGNOSIS — R1033 Periumbilical pain: Secondary | ICD-10-CM | POA: Diagnosis not present

## 2023-01-07 DIAGNOSIS — E669 Obesity, unspecified: Secondary | ICD-10-CM | POA: Diagnosis not present

## 2023-01-07 DIAGNOSIS — E871 Hypo-osmolality and hyponatremia: Secondary | ICD-10-CM | POA: Diagnosis not present

## 2023-01-07 DIAGNOSIS — Z6830 Body mass index (BMI) 30.0-30.9, adult: Secondary | ICD-10-CM | POA: Diagnosis not present

## 2023-01-07 DIAGNOSIS — I69393 Ataxia following cerebral infarction: Secondary | ICD-10-CM | POA: Diagnosis not present

## 2023-01-07 DIAGNOSIS — I129 Hypertensive chronic kidney disease with stage 1 through stage 4 chronic kidney disease, or unspecified chronic kidney disease: Secondary | ICD-10-CM | POA: Diagnosis not present

## 2023-01-07 DIAGNOSIS — N400 Enlarged prostate without lower urinary tract symptoms: Secondary | ICD-10-CM | POA: Diagnosis not present

## 2023-01-07 DIAGNOSIS — I69351 Hemiplegia and hemiparesis following cerebral infarction affecting right dominant side: Secondary | ICD-10-CM | POA: Diagnosis not present

## 2023-01-07 DIAGNOSIS — M170 Bilateral primary osteoarthritis of knee: Secondary | ICD-10-CM | POA: Diagnosis not present

## 2023-01-07 DIAGNOSIS — I739 Peripheral vascular disease, unspecified: Secondary | ICD-10-CM | POA: Diagnosis not present

## 2023-01-07 DIAGNOSIS — M25461 Effusion, right knee: Secondary | ICD-10-CM | POA: Diagnosis not present

## 2023-01-07 DIAGNOSIS — K581 Irritable bowel syndrome with constipation: Secondary | ICD-10-CM | POA: Diagnosis not present

## 2023-01-07 DIAGNOSIS — G4733 Obstructive sleep apnea (adult) (pediatric): Secondary | ICD-10-CM | POA: Diagnosis not present

## 2023-01-07 DIAGNOSIS — E785 Hyperlipidemia, unspecified: Secondary | ICD-10-CM | POA: Diagnosis not present

## 2023-01-07 LAB — CBC WITH DIFFERENTIAL/PLATELET
Absolute Monocytes: 422 cells/uL (ref 200–950)
Basophils Absolute: 31 cells/uL (ref 0–200)
Basophils Relative: 0.5 %
Eosinophils Absolute: 112 cells/uL (ref 15–500)
Eosinophils Relative: 1.8 %
Hemoglobin: 10.9 g/dL — ABNORMAL LOW (ref 13.2–17.1)
Lymphs Abs: 2114 cells/uL (ref 850–3900)
Platelets: 321 10*3/uL (ref 140–400)
RBC: 3.86 10*6/uL — ABNORMAL LOW (ref 4.20–5.80)
RDW: 12.6 % (ref 11.0–15.0)
Total Lymphocyte: 34.1 %
WBC: 6.2 10*3/uL (ref 3.8–10.8)

## 2023-01-07 NOTE — Progress Notes (Unsigned)
Acute Office Visit   Patient: Jared Cline   DOB: [birth year]   77 y.o. Male  MRN: [id number] Visit Date: 01/07/2023  Today's healthcare provider: Oswaldo Conroy Lissa Rowles, PA-C  Introduced myself to the patient as a Secondary school teacher and provided education on APPs in clinical practice.    Chief Complaint  Patient presents with   Abdominal Pain    Since yesterday, twisting feeling.  Possible diverticulitis   Subjective    HPI HPI     Abdominal Pain    Additional comments: Since yesterday, twisting feeling.  Possible diverticulitis      Last edited by Marcos Eke, CMA on 01/07/2023  3:08 PM.       He reports he has a history of diverticulitis  He states he has been eating peanuts over the last two weeks  Abdominal Pain  Onset: sudden  Duration: last night around 2:00 and has continued today  Location: middle of stomach  Radiation: none  Pain level and character: 10/10 this AM but has reduced. He reports pain was worse after eating breakfast. He reports pain is sharp and tight, constant  Other associated symptoms: he reports constipation but this is ongoing  He states he takes a laxative about every 2 days to help with this. He reports relief with the laxative  Interventions: He reports he has been passing gas without issue.  Alleviating: nothing  Aggravating: eating seems to make it worse   He denies eating anything out of the ordinary for him  He denies a previous hx of GERD or current heartburn symptoms He reports his wife had an episode of reflux symptoms yesterday but was seen by her PCP   Medications: Outpatient Medications Prior to Visit  Medication Sig   acetaminophen (TYLENOL) 650 MG CR tablet Take 2 tablets (1,300 mg total) by mouth every 12 (twelve) hours as needed (Mild pain).   apixaban (ELIQUIS) 5 MG TABS tablet Take 1 tablet (5 mg total) by mouth 2 (two) times daily.   aspirin EC 81 MG tablet Take 1 tablet (81 mg total) by mouth daily at 6 (six) AM. Swallow whole.    cholecalciferol (VITAMIN D3) 25 MCG (1000 UNIT) tablet Take 1,000 Units by mouth daily.   CINNAMON PO Take by mouth.   cloNIDine (CATAPRES) 0.2 MG tablet Take 0.2 mg by mouth 2 (two) times daily.   clopidogrel (PLAVIX) 75 MG tablet Take 1 tablet (75 mg total) by mouth daily.   cyanocobalamin (VITAMIN B12) 1000 MCG tablet Take 1,000 mcg by mouth daily.   diclofenac Sodium (VOLTAREN) 1 % GEL Apply 2 g topically 4 (four) times daily.   furosemide (LASIX) 20 MG tablet Take 1 tablet (20 mg total) by mouth daily.   GARLIC PO Take by mouth.   hydrALAZINE (APRESOLINE) 10 MG tablet TAKE 1 TABLET BY MOUTH TWICE DAILY AS NEEDED; TAKE IF BLOOD PRESSURE GREATER THAN 140/ 90   labetalol (NORMODYNE) 100 MG tablet 100 mg 2 (two) times daily.   pravastatin (PRAVACHOL) 80 MG tablet Take 1 tablet (80 mg total) by mouth at bedtime.   senna-docusate (SENOKOT-S) 8.6-50 MG tablet Take 3 tablets by mouth daily at 6 (six) AM.   TURMERIC PO Take by mouth.   valsartan (DIOVAN) 160 MG tablet Take 1 tablet (160 mg total) by mouth daily.   No facility-administered medications prior to visit.    Review of Systems  Constitutional:  Negative for chills and fever.  Gastrointestinal:  Positive  for abdominal distention, abdominal pain and constipation. Negative for blood in stool, diarrhea, nausea and vomiting.  Genitourinary:  Negative for difficulty urinating, dysuria, frequency, penile pain, penile swelling, scrotal swelling and testicular pain.    {Labs  Heme  Chem  Endocrine  Serology  Results Review (optional):23779}   Objective    BP (!) 154/78   Pulse 87   Temp 98.7 F (37.1 C)   Resp 18   Ht 6' (1.829 m)   Wt 227 lb (103 kg)   SpO2 98%   BMI 30.79 kg/m  {Show previous vital signs (optional):23777}  Physical Exam Vitals reviewed.  Constitutional:      General: He is awake.     Appearance: Normal appearance. He is well-developed and well-groomed.  HENT:     Head: Normocephalic and atraumatic.   Pulmonary:     Effort: Pulmonary effort is normal.  Abdominal:     General: Abdomen is protuberant. A surgical scar is present.     Palpations: Abdomen is soft.     Tenderness: There is abdominal tenderness in the right lower quadrant, epigastric area, periumbilical area and left lower quadrant. There is no right CVA tenderness or left CVA tenderness.  Neurological:     General: No focal deficit present.     Mental Status: He is alert and oriented to person, place, and time.     GCS: GCS eye subscore is 4. GCS verbal subscore is 5. GCS motor subscore is 6.  Psychiatric:        Mood and Affect: Mood normal.        Behavior: Behavior normal. Behavior is cooperative.        Thought Content: Thought content normal.        Judgment: Judgment normal.       No results found for any visits on 01/07/23.  Assessment & Plan      No follow-ups on file.

## 2023-01-07 NOTE — Telephone Encounter (Signed)
Message from Allen Kell sent at 01/07/2023 10:46 AM EDT  Summary: irritable bowel syndrome?   Pt states that he is suffering from irritable bowel syndrome and is wanting to see if his PCP can call in some medication for him. Please advise.          Call History   Type Contact Phone/Fax User  01/07/2023 10:45 AM EDT Phone (Incoming) Jared Cline, Jared Cline (Self) [phone] Judie Petit) Johney Frame, Dondra Prader

## 2023-01-07 NOTE — Telephone Encounter (Addendum)
Message from Allen Kell sent at 01/07/2023 10:46 AM EDT  Summary: irritable bowel syndrome?   Pt states that he is suffering from irritable bowel syndrome and is wanting to see if his PCP can call in some medication for him. Please advise.        Third attempt to call pt, unable to make contact. Pt called back and triage done  Chief Complaint: cramping abd pain  Symptoms: woke this am 0600 with abdominal cramping across whole stomach  Frequency: since 060 this am  Pertinent Negatives: Patient denies nausea, vomiting, constipation, abd distension, fever Disposition: [] ED /[] Urgent Care (no appt availability in office) / [x] Appointment(In office/virtual)/ []  [place] [facility]/ [] Home Care/ [] Refused Recommended Disposition /[] [place] [facility]/ []  Follow-up with PCP Additional Notes: pt stated he has h/o same sx in the past and was given a med (pt doesn't remember the med and cleared it up- pt has h/o diverticulitis. Pt state he ate peanuts last week. Has h/o abd distension. Pt is not in any distress and with no report of distension, vomiting or diarrhea felt OV ok   Reason for Disposition  Third attempt to contact caller AND no contact made. Phone number verified.  [1] SEVERE pain AND [2] age > 60 years  Answer Assessment - Initial Assessment Questions 1. LOCATION: "Where does it hurt?"      Whole stomach 2. RADIATION: "Does the pain shoot anywhere else?" (e.g., chest, back)     no 3. ONSET: "When did the pain begin?" (Minutes, hours or days ago)      0600 this am  4. SUDDEN: "Gradual or sudden onset?"     sudden 5. PATTERN "Does the pain come and go, or is it constant?"    - If it comes and goes: "How long does it last?" "Do you have pain now?"     (Note: Comes and goes means the pain is intermittent. It goes away completely between bouts.)    - If constant: "Is it getting better, staying the same, or getting worse?"      (Note: Constant means the pain never goes  away completely; most serious pain is constant and gets worse.)      Constant cramping 6. SEVERITY: "How bad is the pain?"  (e.g., Scale 1-10; mild, moderate, or severe)    - MILD (1-3): Doesn't interfere with normal activities, abdomen soft and not tender to touch.     - MODERATE (4-7): Interferes with normal activities or awakens from sleep, abdomen tender to touch.     - SEVERE (8-10): Excruciating pain, doubled over, unable to do any normal activities.       9/10 7. RECURRENT SYMPTOM: "Have you ever had this type of stomach pain before?" If Yes, ask: "When was the last time?" and "What happened that time?"      Yes for IBS  8. CAUSE: "What do you think is causing the stomach pain?"     IBS 9. RELIEVING/AGGRAVATING FACTORS: "What makes it better or worse?" (e.g., antacids, bending or twisting motion, bowel movement)     In the past took meds and cleared up 10. OTHER SYMPTOMS: "Do you have any other symptoms?" (e.g., back pain, diarrhea, fever, urination pain, vomiting)       no  Protocols used: No Contact or Duplicate Contact Call-A-AH, Abdominal Pain - Male-A-AH

## 2023-01-07 NOTE — Telephone Encounter (Signed)
Requested medication (s) are due for refill today: For review  Requested medication (s) are on the active medication list: yes    Last refill: 11/28/22  Amount not specified  Future visit scheduled yes 01/17/23  Notes to clinic:Historical provider,not delegated, please review. Thank you.  Requested Prescriptions  Pending Prescriptions Disp Refills   cloNIDine (CATAPRES) 0.2 MG tablet [Pharmacy Med Name: CLONIDINE 0.2MG  TABLETS] 180 tablet     Sig: TAKE 1 TABLET(0.2 MG) BY MOUTH TWICE DAILY     Cardiovascular:  Alpha-2 Agonists Failed - 01/06/2023  8:18 AM      Failed - Last BP in normal range    BP Readings from Last 1 Encounters:  12/30/22 (!) 159/69         Passed - Last Heart Rate in normal range    Pulse Readings from Last 1 Encounters:  12/30/22 (!) 59         Passed - Valid encounter within last 6 months    Recent Outpatient Visits           2 weeks ago Hospital discharge follow-up   [facility] Della Goo F, FNP   1 month ago Acute nonintractable headache, unspecified headache type   [facility] Berniece Salines, FNP   1 month ago Carotid artery stenosis, symptomatic, right   [facility] Berniece Salines, FNP   3 months ago History of CVA (cerebrovascular accident)   [facility] Berniece Salines, FNP   5 months ago PVD (peripheral vascular disease) [facility]   [facility] Berniece Salines, FNP       Future Appointments             In 1 week Zane Herald, Rudolpho Sevin, FNP [facility]   In 9 months Richardo Hanks, Laurette Schimke, MD [facility] [place]

## 2023-01-07 NOTE — Patient Instructions (Addendum)
  For now I recommend the following:  Try to consume a liquid diet. This means plenty of water and liquid foods such as soup, broth, applesauce, Boost or Ensure, pedialyte, etc Try this for a few days and if you start to feel better you can progress to small, bland meals like boiled chicken, rice, applesauce, bananas You can use Tylenol for pain and can try Pepto or Tums if you develop reflux or heartburn symptoms.   If you feel worse, develop more symptoms (fever, more intense pain, weakness, nausea, vomiting, rectal bleeding, you stomach feels tight, etc) please let us know or go to the ED  We will keep you updated on the results of your labs once they come back.

## 2023-01-07 NOTE — Telephone Encounter (Signed)
Seen in office.

## 2023-01-07 NOTE — Telephone Encounter (Signed)
2nd attempt to return his call.   Left a voicemail to call back to discuss symptoms with a nurse. 

## 2023-01-07 NOTE — Telephone Encounter (Signed)
Attempted to return his call.   Left a voicemail to call back to discuss symptoms with a nurse. 

## 2023-01-08 ENCOUNTER — Other Ambulatory Visit: Payer: Self-pay | Admitting: Nurse Practitioner

## 2023-01-08 DIAGNOSIS — Z6830 Body mass index (BMI) 30.0-30.9, adult: Secondary | ICD-10-CM | POA: Diagnosis not present

## 2023-01-08 DIAGNOSIS — G5602 Carpal tunnel syndrome, left upper limb: Secondary | ICD-10-CM | POA: Diagnosis not present

## 2023-01-08 DIAGNOSIS — N400 Enlarged prostate without lower urinary tract symptoms: Secondary | ICD-10-CM | POA: Diagnosis not present

## 2023-01-08 DIAGNOSIS — I739 Peripheral vascular disease, unspecified: Secondary | ICD-10-CM | POA: Diagnosis not present

## 2023-01-08 DIAGNOSIS — Z7982 Long term (current) use of aspirin: Secondary | ICD-10-CM | POA: Diagnosis not present

## 2023-01-08 DIAGNOSIS — M5136 Other intervertebral disc degeneration, lumbar region: Secondary | ICD-10-CM | POA: Diagnosis not present

## 2023-01-08 DIAGNOSIS — I69393 Ataxia following cerebral infarction: Secondary | ICD-10-CM | POA: Diagnosis not present

## 2023-01-08 DIAGNOSIS — I10 Essential (primary) hypertension: Secondary | ICD-10-CM | POA: Diagnosis not present

## 2023-01-08 DIAGNOSIS — E7800 Pure hypercholesterolemia, unspecified: Secondary | ICD-10-CM | POA: Diagnosis not present

## 2023-01-08 DIAGNOSIS — Z8546 Personal history of malignant neoplasm of prostate: Secondary | ICD-10-CM | POA: Diagnosis not present

## 2023-01-08 DIAGNOSIS — R296 Repeated falls: Secondary | ICD-10-CM | POA: Diagnosis not present

## 2023-01-08 DIAGNOSIS — E871 Hypo-osmolality and hyponatremia: Secondary | ICD-10-CM | POA: Diagnosis not present

## 2023-01-08 DIAGNOSIS — E785 Hyperlipidemia, unspecified: Secondary | ICD-10-CM | POA: Diagnosis not present

## 2023-01-08 DIAGNOSIS — Z7902 Long term (current) use of antithrombotics/antiplatelets: Secondary | ICD-10-CM | POA: Diagnosis not present

## 2023-01-08 DIAGNOSIS — G4733 Obstructive sleep apnea (adult) (pediatric): Secondary | ICD-10-CM | POA: Diagnosis not present

## 2023-01-08 DIAGNOSIS — M25461 Effusion, right knee: Secondary | ICD-10-CM | POA: Diagnosis not present

## 2023-01-08 DIAGNOSIS — M1993 Secondary osteoarthritis, unspecified site: Secondary | ICD-10-CM | POA: Diagnosis not present

## 2023-01-08 DIAGNOSIS — H5509 Other forms of nystagmus: Secondary | ICD-10-CM | POA: Diagnosis not present

## 2023-01-08 DIAGNOSIS — I69351 Hemiplegia and hemiparesis following cerebral infarction affecting right dominant side: Secondary | ICD-10-CM | POA: Diagnosis not present

## 2023-01-08 DIAGNOSIS — Z9181 History of falling: Secondary | ICD-10-CM | POA: Diagnosis not present

## 2023-01-08 DIAGNOSIS — E669 Obesity, unspecified: Secondary | ICD-10-CM | POA: Diagnosis not present

## 2023-01-08 DIAGNOSIS — I2510 Atherosclerotic heart disease of native coronary artery without angina pectoris: Secondary | ICD-10-CM | POA: Diagnosis not present

## 2023-01-08 DIAGNOSIS — N2889 Other specified disorders of kidney and ureter: Secondary | ICD-10-CM | POA: Diagnosis not present

## 2023-01-08 DIAGNOSIS — I129 Hypertensive chronic kidney disease with stage 1 through stage 4 chronic kidney disease, or unspecified chronic kidney disease: Secondary | ICD-10-CM | POA: Diagnosis not present

## 2023-01-08 DIAGNOSIS — N1832 Chronic kidney disease, stage 3b: Secondary | ICD-10-CM | POA: Diagnosis not present

## 2023-01-08 DIAGNOSIS — Z7901 Long term (current) use of anticoagulants: Secondary | ICD-10-CM | POA: Diagnosis not present

## 2023-01-08 DIAGNOSIS — I639 Cerebral infarction, unspecified: Secondary | ICD-10-CM | POA: Diagnosis not present

## 2023-01-08 DIAGNOSIS — M170 Bilateral primary osteoarthritis of knee: Secondary | ICD-10-CM | POA: Diagnosis not present

## 2023-01-08 DIAGNOSIS — K581 Irritable bowel syndrome with constipation: Secondary | ICD-10-CM | POA: Diagnosis not present

## 2023-01-08 LAB — COMPLETE METABOLIC PANEL WITH GFR
AG Ratio: 1.9 (calc) (ref 1.0–2.5)
ALT: 11 U/L (ref 9–46)
AST: 17 U/L (ref 10–35)
Albumin: 4.5 g/dL (ref 3.6–5.1)
Alkaline phosphatase (APISO): 57 U/L (ref 35–144)
BUN/Creatinine Ratio: 14 (calc) (ref 6–22)
BUN: 20 mg/dL (ref 7–25)
CO2: 27 mmol/L (ref 20–32)
Calcium: 9.5 mg/dL (ref 8.6–10.3)
Chloride: 105 mmol/L (ref 98–110)
Creat: 1.39 mg/dL — ABNORMAL HIGH (ref 0.70–1.28)
Globulin: 2.4 g/dL (calc) (ref 1.9–3.7)
Glucose, Bld: 92 mg/dL (ref 65–99)
Potassium: 4.6 mmol/L (ref 3.5–5.3)
Sodium: 140 mmol/L (ref 135–146)
Total Bilirubin: 0.4 mg/dL (ref 0.2–1.2)
Total Protein: 6.9 g/dL (ref 6.1–8.1)
eGFR: 52 mL/min/{1.73_m2} — ABNORMAL LOW (ref >=60)

## 2023-01-08 LAB — CBC WITH DIFFERENTIAL/PLATELET
HCT: 33.2 % — ABNORMAL LOW (ref 38.5–50.0)
MCH: 28.2 pg (ref 27.0–33.0)
MCHC: 32.8 g/dL (ref 32.0–36.0)
MCV: 86 fL (ref 80.0–100.0)
MPV: 9.6 fL (ref 7.5–12.5)
Monocytes Relative: 6.8 %
Neutro Abs: 3522 cells/uL (ref 1500–7800)
Neutrophils Relative %: 56.8 %

## 2023-01-08 LAB — SEDIMENTATION RATE: Sed Rate: 6 mm/h (ref 0–20)

## 2023-01-08 LAB — AMYLASE: Amylase: 84 U/L (ref 21–101)

## 2023-01-08 LAB — C-REACTIVE PROTEIN: CRP: <3 mg/L (ref <8.0)

## 2023-01-08 LAB — LIPASE: Lipase: 28 U/L (ref 7–60)

## 2023-01-08 MED ORDER — CLONIDINE HCL 0.2 MG PO TABS: 0.2000 mg | ORAL_TABLET | Freq: Two times a day (BID) | ORAL | 1 refills | Status: DC

## 2023-01-08 NOTE — Telephone Encounter (Signed)
Pt has been out of this medication for 2 days and is requesting a refill sent to [facility].

## 2023-01-08 NOTE — Progress Notes (Signed)
Your lab results have returned. Your blood count does not show an elevated white count which we would expect if you had an infection of some kind.  It does show that you are anemic but this appears to be stable compared to your most recent results from about a month ago. Your electrolytes are overall pretty normal as well as your liver function.  Your kidney function has decreased but appears stable when compared with your previous results from about a month ago and even before that. Your inflammatory markers are in normal limits. Your results to test your pancreas for inflammation or issues there were also in normal limits. At this time I recommend that we proceed with the measures that we discussed during your appointment and that were provided to you on your after visit summary. If you experience more severe symptoms or if you feel like things are getting worse please let us know or go to the emergency room

## 2023-01-13 IMAGING — US US RENAL
1 series · 13 of 25 positions shown · non-contrast
Comparison: Abdominal ultrasound, 05/27/2014. CT abdomen pelvis,
11/03/2015, 08/25/2014 and 01/01/2011.

CLINICAL DATA: Renal mass.  CKD.

EXAM:
RENAL / URINARY TRACT ULTRASOUND COMPLETE

[Series 1: us renal · 0.23mm/px · 13 of 38 slices shown]
[im 1/38]
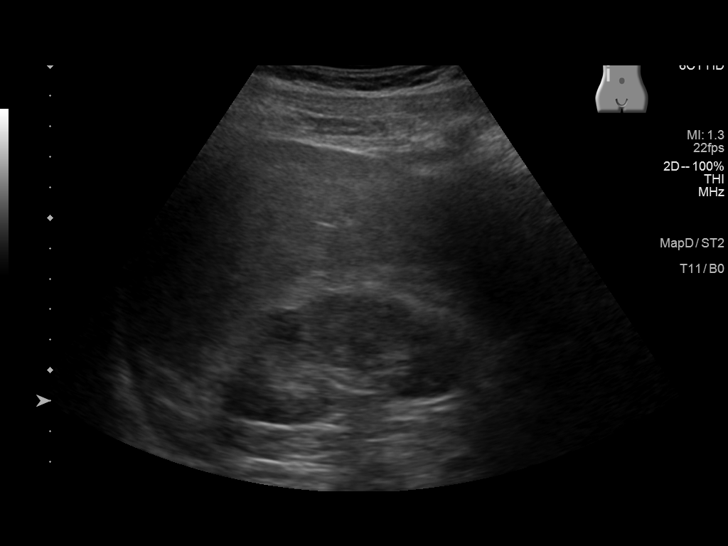
[im 4/38]
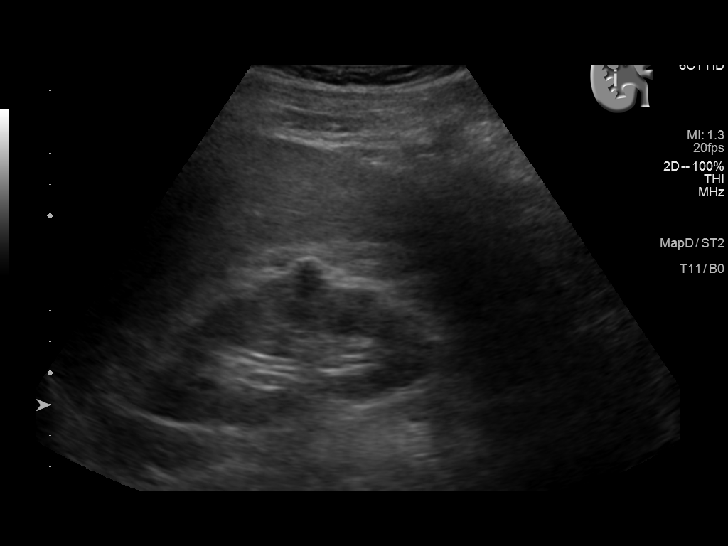
[im 7/38]
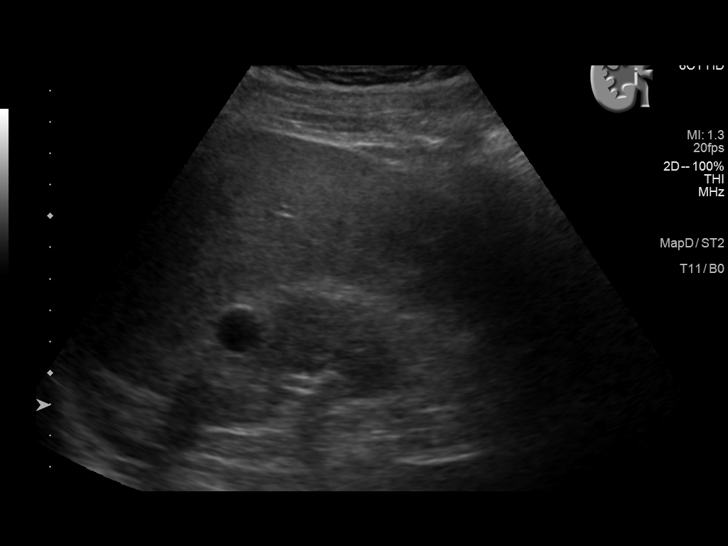
[im 10/38]
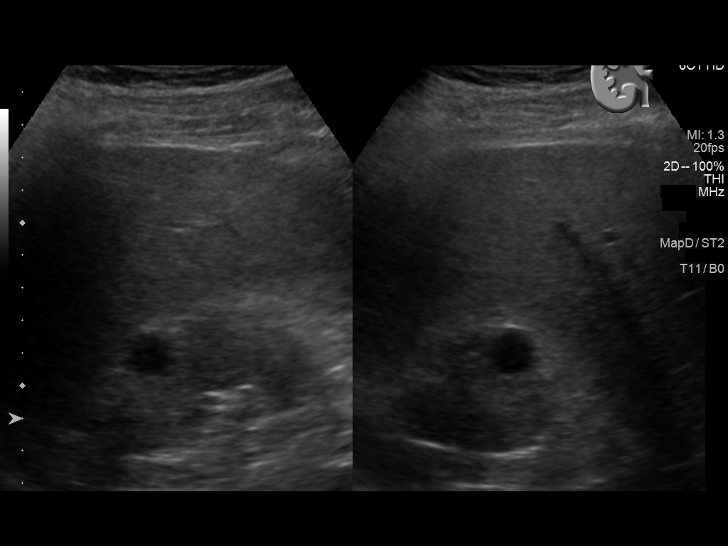
[im 13/38]
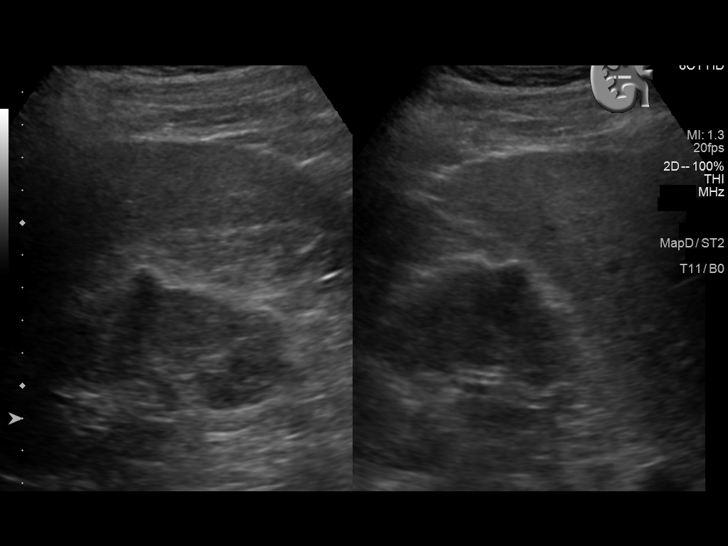
[im 16/38]
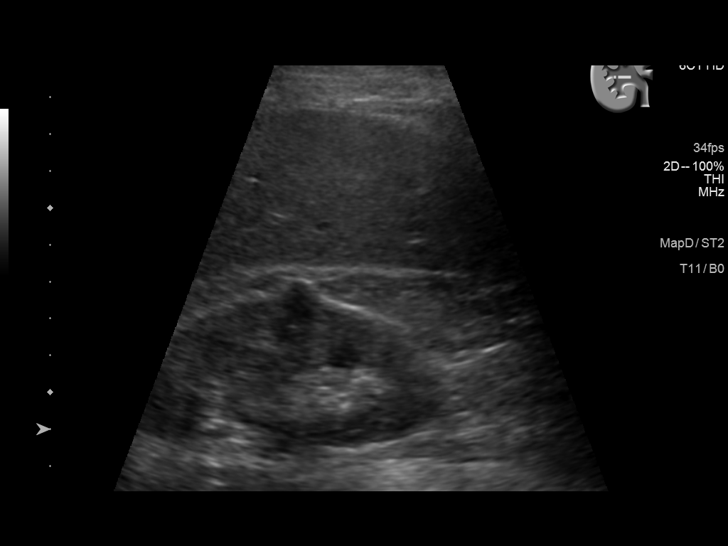
[im 19/38]
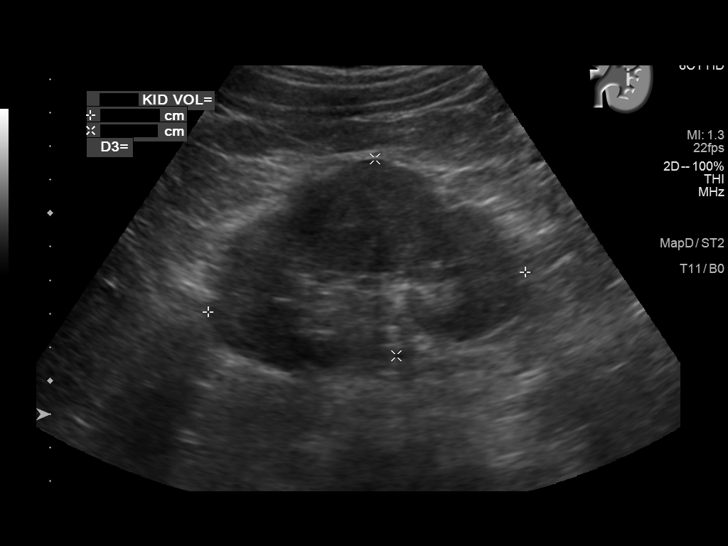
[im 22/38]
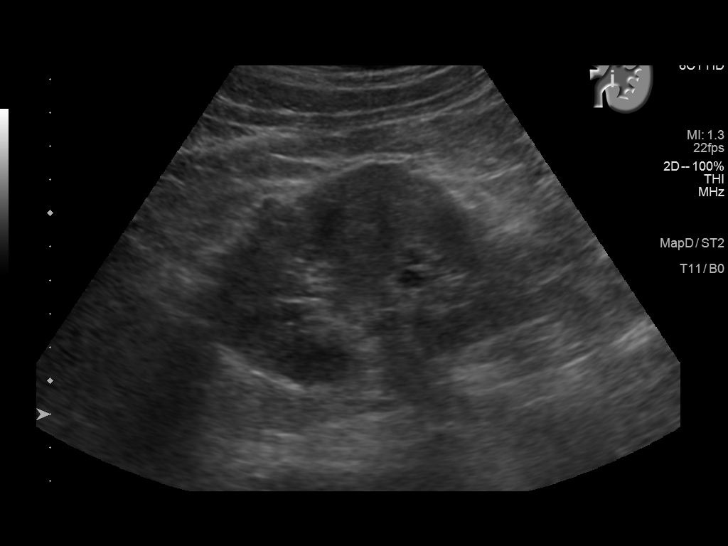
[im 25/38]
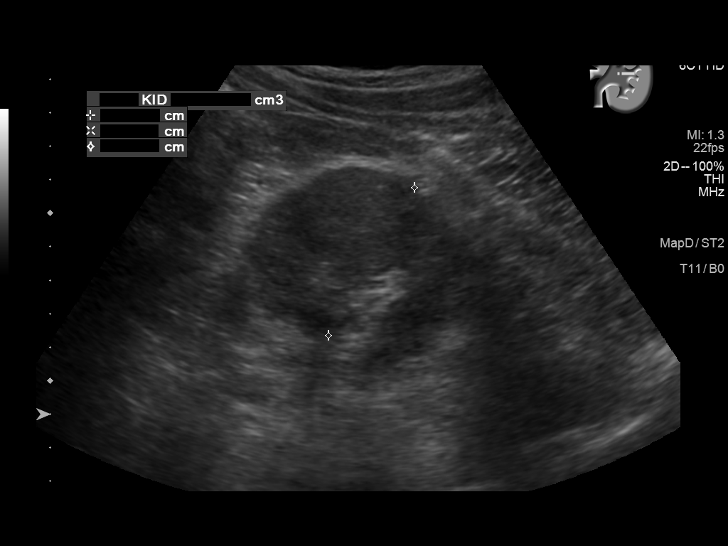
[im 28/38]
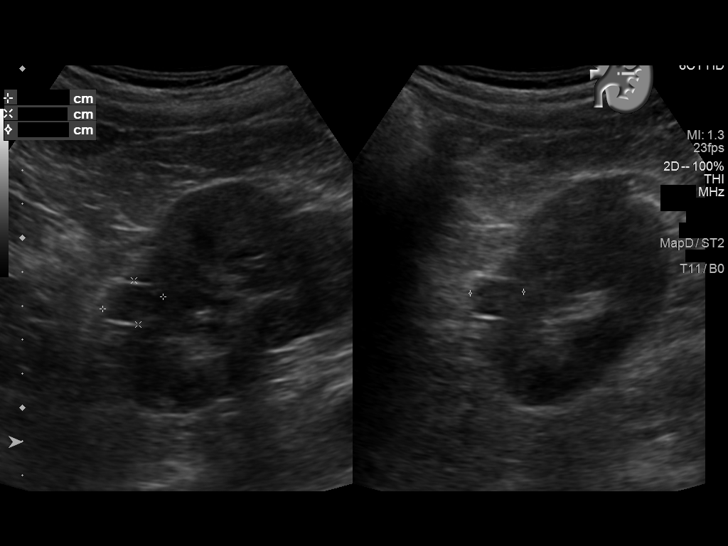
[im 31/38]
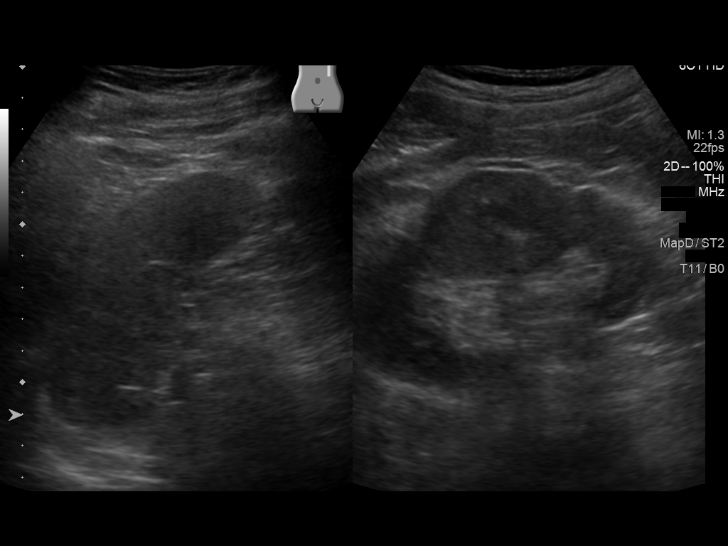
[im 34/38]
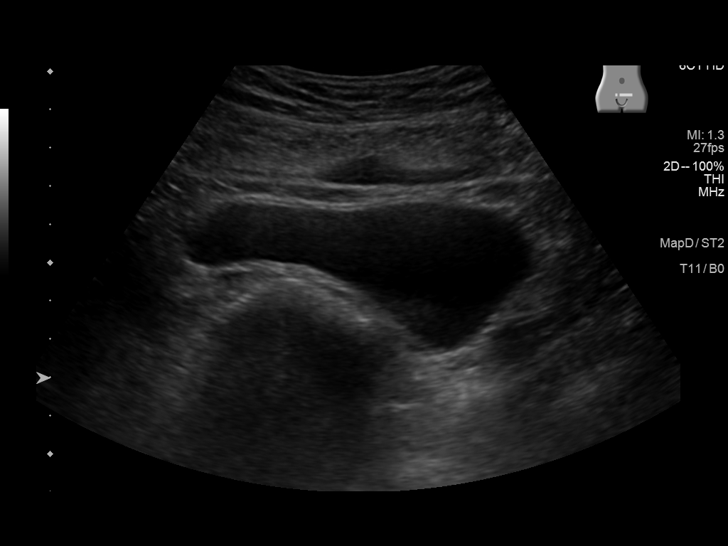
[im 38/38]
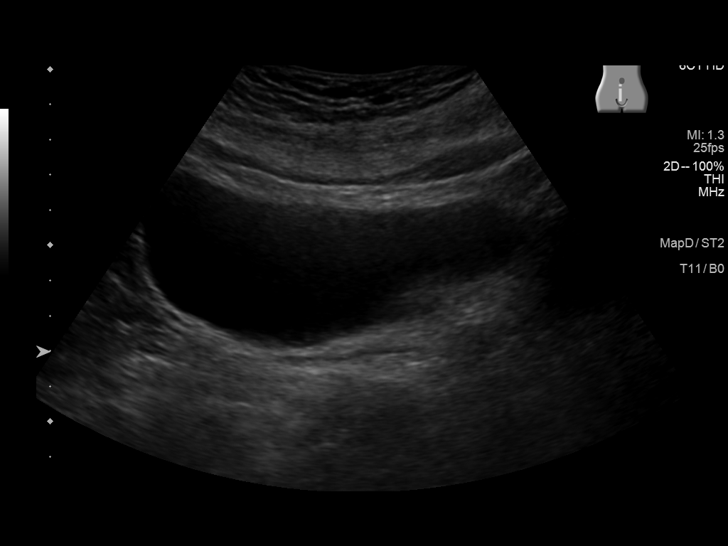

[13 of 25 positions shown; findings below may reference images not displayed]

FINDINGS: Right Kidney:

Renal measurements: 9.6 x 4.4 x 5.7 = volume: 125 mL. Echogenicity
within normal limits. No hydronephrosis.

Small, round hypoechoic/anechoic exophytic renal lesions within the
superior and mid renal poles measuring up to 1.6 and 1.3 cm
respectively.

Previously identified exophytic RIGHT lower pole renal mass is not
appreciated on this evaluation.

Left Kidney:

Renal measurements: 9.5 x 5.9 x 5.1 = volume: 150 mL. Echogenicity
within normal limits. No hydronephrosis.

Small, round hypoechoic/anechoic exophytic renal lesion measuring up
to 1.8 cm midpolar.

Additional circumscribed, anechoic renal cortical lesion with
septation measures up to 1.4 cm.

Bladder:

Appears normal for degree of bladder distention.

Prostatomegaly, measuring up to 6.0 cm though incompletely assessed.

Other:

No intra-abdominal ascites.
IMPRESSION: 1. Nonenlarged kidneys without hydronephrosis.
2. Previously-identified 1.6 cm RIGHT lower pole enhancing renal
mass on comparison CT is not appreciated on this evaluation.
Consider dedicated, multiphasic MR versus renal biopsy for further
evaluation.
3. Bilateral exophytic and renal cortical cysts, including 1.4 cm
minimally complex and septated LEFT renal cyst.
4. Prostatomegaly, incompletely assessed. No sonographic evidence of
bladder outlet obstruction.

## 2023-01-16 DIAGNOSIS — R600 Localized edema: Secondary | ICD-10-CM | POA: Insufficient documentation

## 2023-01-16 DIAGNOSIS — Z6830 Body mass index (BMI) 30.0-30.9, adult: Secondary | ICD-10-CM | POA: Diagnosis not present

## 2023-01-16 DIAGNOSIS — N1832 Chronic kidney disease, stage 3b: Secondary | ICD-10-CM | POA: Diagnosis not present

## 2023-01-16 DIAGNOSIS — E785 Hyperlipidemia, unspecified: Secondary | ICD-10-CM | POA: Diagnosis not present

## 2023-01-16 DIAGNOSIS — M170 Bilateral primary osteoarthritis of knee: Secondary | ICD-10-CM | POA: Diagnosis not present

## 2023-01-16 DIAGNOSIS — N1831 Chronic kidney disease, stage 3a: Secondary | ICD-10-CM | POA: Insufficient documentation

## 2023-01-16 DIAGNOSIS — I69393 Ataxia following cerebral infarction: Secondary | ICD-10-CM | POA: Diagnosis not present

## 2023-01-16 DIAGNOSIS — E669 Obesity, unspecified: Secondary | ICD-10-CM | POA: Diagnosis not present

## 2023-01-16 DIAGNOSIS — E871 Hypo-osmolality and hyponatremia: Secondary | ICD-10-CM | POA: Diagnosis not present

## 2023-01-16 DIAGNOSIS — I739 Peripheral vascular disease, unspecified: Secondary | ICD-10-CM | POA: Diagnosis not present

## 2023-01-16 DIAGNOSIS — I129 Hypertensive chronic kidney disease with stage 1 through stage 4 chronic kidney disease, or unspecified chronic kidney disease: Secondary | ICD-10-CM | POA: Diagnosis not present

## 2023-01-16 DIAGNOSIS — I69351 Hemiplegia and hemiparesis following cerebral infarction affecting right dominant side: Secondary | ICD-10-CM | POA: Diagnosis not present

## 2023-01-16 DIAGNOSIS — G5602 Carpal tunnel syndrome, left upper limb: Secondary | ICD-10-CM | POA: Diagnosis not present

## 2023-01-16 DIAGNOSIS — M25461 Effusion, right knee: Secondary | ICD-10-CM | POA: Diagnosis not present

## 2023-01-16 DIAGNOSIS — I2510 Atherosclerotic heart disease of native coronary artery without angina pectoris: Secondary | ICD-10-CM | POA: Diagnosis not present

## 2023-01-16 DIAGNOSIS — N400 Enlarged prostate without lower urinary tract symptoms: Secondary | ICD-10-CM | POA: Diagnosis not present

## 2023-01-16 DIAGNOSIS — K581 Irritable bowel syndrome with constipation: Secondary | ICD-10-CM | POA: Diagnosis not present

## 2023-01-16 DIAGNOSIS — G4733 Obstructive sleep apnea (adult) (pediatric): Secondary | ICD-10-CM | POA: Diagnosis not present

## 2023-01-16 NOTE — Progress Notes (Deleted)
There were no vitals taken for this visit.   Subjective:    Patient ID: Jared Cline, male    DOB: February 15, 1946, 77 y.o.   MRN: [id number]  HPI: Jared Cline is a 77 y.o. male  No chief complaint on file.  HTN:  Hyperlipidemia/CAD/aortic atherosclerosis:  History of CVA:  OSA on CPAP:  CKD:  PVD/lower extremity edema:  Relevant past medical, surgical, family and social history reviewed and updated as indicated. Interim medical history since our last visit reviewed. Allergies and medications reviewed and updated.  Review of Systems  Constitutional: Negative for fever or weight change.  Respiratory: Negative for cough and shortness of breath.   Cardiovascular: Negative for chest pain or palpitations.  Gastrointestinal: Negative for abdominal pain, no bowel changes.  Musculoskeletal: Negative for gait problem or joint swelling.  Skin: Negative for rash.  Neurological: Negative for dizziness or headache.  No other specific complaints in a complete review of systems (except as listed in HPI above).      Objective:    There were no vitals taken for this visit.  Wt Readings from Last 3 Encounters:  01/07/23 227 lb (103 kg)  12/30/22 226 lb 3.2 oz (102.6 kg)  12/20/22 224 lb 4.8 oz (101.7 kg)    Physical Exam  Constitutional: Patient appears well-developed and well-nourished. Obese *** No distress.  HEENT: head atraumatic, normocephalic, pupils equal and reactive to light, ears ***, neck supple, throat within normal limits Cardiovascular: Normal rate, regular rhythm and normal heart sounds.  No murmur heard. No BLE edema. Pulmonary/Chest: Effort normal and breath sounds normal. No respiratory distress. Abdominal: Soft.  There is no tenderness. Psychiatric: Patient has a normal mood and affect. behavior is normal. Judgment and thought content normal.  Results for orders placed or performed in visit on 01/07/23  CBC w/Diff/Platelet  Result Value Ref Range   WBC 6.2 3.8 -  10.8 Thousand/uL   RBC 3.86 (L) 4.20 - 5.80 Million/uL   Hemoglobin 10.9 (L) 13.2 - 17.1 g/dL   HCT 78.2 (L) 95.6 - 21.3 %   MCV 86.0 80.0 - 100.0 fL   MCH 28.2 27.0 - 33.0 pg   MCHC 32.8 32.0 - 36.0 g/dL   RDW 08.6 57.8 - 46.9 %   Platelets 321 140 - 400 Thousand/uL   MPV 9.6 7.5 - 12.5 fL   Neutro Abs 3,522 1,500 - 7,800 cells/uL   Lymphs Abs 2,114 850 - 3,900 cells/uL   Absolute Monocytes 422 200 - 950 cells/uL   Eosinophils Absolute 112 15 - 500 cells/uL   Basophils Absolute 31 0 - 200 cells/uL   Neutrophils Relative % 56.8 %   Total Lymphocyte 34.1 %   Monocytes Relative 6.8 %   Eosinophils Relative 1.8 %   Basophils Relative 0.5 %  COMPLETE METABOLIC PANEL WITH GFR  Result Value Ref Range   Glucose, Bld 92 65 - 99 mg/dL   BUN 20 7 - 25 mg/dL   Creat 6.29 (H) 5.28 - 1.28 mg/dL   eGFR 52 (L) > OR = 60 mL/min/1.21m2   BUN/Creatinine Ratio 14 6 - 22 (calc)   Sodium 140 135 - 146 mmol/L   Potassium 4.6 3.5 - 5.3 mmol/L   Chloride 105 98 - 110 mmol/L   CO2 27 20 - 32 mmol/L   Calcium 9.5 8.6 - 10.3 mg/dL   Total Protein 6.9 6.1 - 8.1 g/dL   Albumin 4.5 3.6 - 5.1 g/dL   Globulin 2.4 1.9 - 3.7 g/dL (  calc)   AG Ratio 1.9 1.0 - 2.5 (calc)   Total Bilirubin 0.4 0.2 - 1.2 mg/dL   Alkaline phosphatase (APISO) 57 35 - 144 U/L   AST 17 10 - 35 U/L   ALT 11 9 - 46 U/L  Lipase  Result Value Ref Range   Lipase 28 7 - 60 U/L  Amylase  Result Value Ref Range   Amylase 84 21 - 101 U/L  Sed Rate (ESR)  Result Value Ref Range   Sed Rate 6 0 - 20 mm/h  C-reactive protein  Result Value Ref Range   CRP <3.0 <8.0 mg/L      Assessment & Plan:   Problem List Items Addressed This Visit   None    Follow up plan: No follow-ups on file.

## 2023-01-17 ENCOUNTER — Ambulatory Visit: Payer: Medicare Other | Admitting: Nurse Practitioner

## 2023-01-17 DIAGNOSIS — M170 Bilateral primary osteoarthritis of knee: Secondary | ICD-10-CM | POA: Diagnosis not present

## 2023-01-21 ENCOUNTER — Encounter: Payer: Self-pay | Admitting: Physical Medicine & Rehabilitation

## 2023-01-21 ENCOUNTER — Encounter: Payer: Medicare Other | Attending: Physical Medicine & Rehabilitation | Admitting: Physical Medicine & Rehabilitation

## 2023-01-21 VITALS — BP 193/86 | HR 75 | Ht 72.0 in | Wt 229.0 lb

## 2023-01-21 DIAGNOSIS — R269 Unspecified abnormalities of gait and mobility: Secondary | ICD-10-CM | POA: Insufficient documentation

## 2023-01-21 DIAGNOSIS — I69398 Other sequelae of cerebral infarction: Secondary | ICD-10-CM | POA: Diagnosis not present

## 2023-01-21 NOTE — Progress Notes (Signed)
Subjective:    Patient ID: Jared Cline, male    DOB: 22-Nov-1945, 77 y.o.   MRN: [id number]  77 y.o. male with history of BPH, CKD, severe OA bilateral knees who was recently admitted to [facility] on 08/24/2022 with falls x 2, dizziness, weakness and inability to stand.  He was found to have segmental PE in RLL and LLL.  MRI brain revealed acute punctate infarct posterior inferior pons and right cerebellar peduncle.  MRA revealed extensive intracranial atherosclerosis with advanced mid basilar and bilateral PCA branch stenosis, occluded left VA and moderate right-M1 segment stenosis as well as 50% narrowing proximal left-ICA.  He was started on treatment dose Lovenox and transition to DOAC.  Dr. Selina Cline recommended transition to aspirin once DOAC continued and to follow-up with neurology after discharge.  PT/ OT has been working with patient and patient was limited by right knee pain, balance deficits, dizziness as well as right ataxia.  CIR was recommended due to functional decline   Admit date: 08/29/2022 Discharge date: 09/10/2022 HPI  Intermittent dizziness has appt for cerebral arteriogram next week.    S/p Right carotid stent on plavix  Has PT still coming out  BP elevated in office but has been normal at home measured by PT twice a week Has been told by nurses that he has elevated BP in Dr's offices     Pain Inventory Average Pain 2 Pain Right Now 4 My pain is intermittent and constant   LOCATION OF PAIN  headache  BOWEL Number of stools per week: 2 Oral laxative use Yes  Type of laxative unknown Enema or suppository use No  History of colostomy No  Incontinent No   BLADDER Normal In and out cath, frequency n/a Able to self cath No  Bladder incontinence No  Frequent urination No  Leakage with coughing No  Difficulty starting stream No  Incomplete bladder emptying No    Mobility walk with assistance use a cane use a walker how many minutes can you walk? 15 ability to  climb steps?  yes do you drive?  no  Function retired  Neuro/Psych tingling trouble walking dizziness  Prior Studies N/a  Physicians involved in your care Follow-up   Family History  Problem Relation Age of Onset   Stroke Mother    Hypertension Mother    Aneurysm Father        cerebral   Hypertension Father    Heart disease Paternal Uncle    Alcohol abuse Brother    Cancer Neg Hx    COPD Neg Hx    Diabetes Neg Hx    Social History   Socioeconomic History   Marital status: Married    Spouse name: Jared Cline   Number of children: 3   Years of education: Not on file   Highest education level: Not on file  Occupational History   Occupation: retired [employer]  Tobacco Use   Smoking status: Former    Packs/day: 0.50    Years: 9.00    Additional pack years: 0.00    Total pack years: 4.50    Types: Cigarettes    Quit date: 08/20/1971    Years since quitting: 51.4    Passive exposure: Past   Smokeless tobacco: Never  Vaping Use   Vaping Use: Never used  Substance and Sexual Activity   Alcohol use: No    Alcohol/week: 0.0 standard drinks of alcohol   Drug use: No   Sexual activity: Not Currently  Other Topics Concern   Not on file  Social History Narrative   Retired from [employer] 2012.   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    Social Determinants of Health   Financial Resource Strain: Low Risk  (07/09/2022)   Overall Financial Resource Strain (CARDIA)    Difficulty of Paying Living Expenses: Not hard at all  Food Insecurity: No Food Insecurity (12/10/2022)   Hunger Vital Sign    Worried About Running Out of Food in the Last Year: Never true    Ran Out of Food in the Last Year: Never true  Recent Concern: Food Insecurity - Food Insecurity Present (09/30/2022)   Hunger Vital Sign    Worried About Running Out of Food in the Last Year: Never true    Ran Out of Food in the Last Year: Sometimes true  Transportation Needs:  No Transportation Needs (12/10/2022)   PRAPARE - Administrator, Civil Service (Medical): No    Lack of Transportation (Non-Medical): No  Physical Activity: Inactive (07/05/2021)   Exercise Vital Sign    Days of Exercise per Week: 0 days    Minutes of Exercise per Session: 0 min  Stress: No Stress Concern Present (07/09/2022)   [employer] of Occupational Health - Occupational Stress Questionnaire    Feeling of Stress : Not at all  Social Connections: Socially Integrated (07/09/2022)   Social Connection and Isolation Panel [NHANES]    Frequency of Communication with Friends and Family: Three times a week    Frequency of Social Gatherings with Friends and Family: Once a week    Attends Religious Services: More than 4 times per year    Active Member of Clubs or Organizations: Not on file    Attends Banker Meetings: More than 4 times per year    Marital Status: Married   Past Surgical History:  Procedure Laterality Date   abdominal ultrasound  Oct 2015   Negative for eval for AAA   CAROTID PTA/STENT INTERVENTION Right 11/26/2022   Procedure: CAROTID PTA/STENT INTERVENTION;  Surgeon: Jared Dills, MD;  Location: [facility] INVASIVE CV LAB;  Service: Cardiovascular;  Laterality: Right;   COLONOSCOPY     KNEE ARTHROSCOPY Right    x 2   LUMBAR LAMINECTOMY     PROSTATECTOMY     robotic   Past Medical History:  Diagnosis Date   BPH (benign prostatic hypertrophy)    Bradycardia    evaluated by Dr. Gwen Cline   Cancer [facility])    prostate   Carpal tunnel syndrome, left    left arm   CKD (chronic kidney disease) stage 2, GFR 60-89 ml/min    Diverticulitis    Gastritis 2017   History of prostate cancer    HNP (herniated nucleus pulposus), lumbar    Hyperlipidemia    Hypertension    IBS (irritable bowel syndrome)    Impotence    Multiple lung nodules    nonspecific, largest 5mm in Jan 2016; scanned March 2017; rescan 3-6 months later   OA (osteoarthritis)  of knee    right   Renal mass    evaluated; followed by urologist   Sleep apnea    on CPAP   Stroke (HCC)    Torn meniscus    x 2; right knee   There were no vitals taken for this visit.  Opioid Risk Score:   Fall Risk Score:  `1  Depression screen [facility] 2/9  01/07/2023    3:09 PM 12/20/2022    8:49 AM 12/05/2022    1:39 PM 11/28/2022    2:22 PM 09/30/2022   11:53 AM 09/24/2022    1:02 PM 09/19/2022    1:22 PM  Depression screen PHQ 2/9  Decreased Interest 0 0 0 0 0 0 0  Down, Depressed, Hopeless 0 0 0 0 0 0 0  PHQ - 2 Score 0 0 0 0 0 0 0  Altered sleeping 0  0   0 0  Tired, decreased energy 0  0   0 0  Change in appetite 0  0   0 0  Feeling bad or failure about yourself  0  0   0 0  Trouble concentrating 0  0   0 0  Moving slowly or fidgety/restless 0  0   0 0  Suicidal thoughts 0  0   0 0  PHQ-9 Score 0  0   0 0  Difficult doing work/chores Not difficult at all  Not difficult at all   Not difficult at all       Review of Systems  Musculoskeletal:        Head pain  All other systems reviewed and are negative.     Objective:   Physical Exam  Motor strength is 5/5 bilateral deltoid, bicep, tricep, grip, hip flexor knee extension ankle dorsiflexion plantarflexion Gait is with a cane no evidence of toe drag or knee instability wide-based support Short step length Speech without dysarthria or aphasia Mood and affect are appropriate There is no evidence of visual field cut      Assessment & Plan:   #1.  History of pontine and cerebellar infarcts, also has left ICA stenosis status post stenting.  He is doing well from a functional standpoint.  I do not think he needs physical medicine rehab follow-up 2.  Hypertension appears to have whitecoat syndrome he has been instructed to take his blood pressure at home and call his primary care provider if his blood pressures greater than 140/90. The patient will follow-up with neurology as well as vascular surgery.

## 2023-01-21 NOTE — Patient Instructions (Signed)
Please take your blood pressure when you get home and if >140/90 call Primary care provider Della Goo

## 2023-01-22 ENCOUNTER — Encounter: Payer: Self-pay | Admitting: Nurse Practitioner

## 2023-01-22 ENCOUNTER — Other Ambulatory Visit: Payer: Self-pay

## 2023-01-22 ENCOUNTER — Ambulatory Visit (INDEPENDENT_AMBULATORY_CARE_PROVIDER_SITE_OTHER): Payer: Medicare Other | Admitting: Nurse Practitioner

## 2023-01-22 VITALS — BP 152/76 | HR 84 | Temp 98.4°F | Resp 16 | Ht 72.0 in | Wt 227.5 lb

## 2023-01-22 DIAGNOSIS — R600 Localized edema: Secondary | ICD-10-CM

## 2023-01-22 DIAGNOSIS — I10 Essential (primary) hypertension: Secondary | ICD-10-CM

## 2023-01-22 LAB — CBC WITH DIFFERENTIAL/PLATELET
Absolute Monocytes: 585 cells/uL (ref 200–950)
Eosinophils Absolute: 78 cells/uL (ref 15–500)
HCT: 35.4 % — ABNORMAL LOW (ref 38.5–50.0)
Lymphs Abs: 2551 cells/uL (ref 850–3900)
MCHC: 32.2 g/dL (ref 32.0–36.0)
MPV: 9.8 fL (ref 7.5–12.5)
RBC: 4.12 10*6/uL — ABNORMAL LOW (ref 4.20–5.80)

## 2023-01-22 MED ORDER — HYDRALAZINE HCL 10 MG PO TABS
ORAL_TABLET | ORAL | 1 refills | Status: DC
Start: 2023-01-22 — End: 2023-04-30

## 2023-01-22 MED ORDER — FUROSEMIDE 20 MG PO TABS
ORAL_TABLET | ORAL | 0 refills | Status: DC
Start: 2023-01-22 — End: 2023-02-21

## 2023-01-22 NOTE — Patient Instructions (Signed)
Call Dr. Juliann Pares cardiology and schedule appointment  Refilled hydralazine 10 mg take when blood pressure greater than 140/90  Refilled lasix 20 mg take every morning, can take additional dose in afternoon if still swelling.

## 2023-01-22 NOTE — Assessment & Plan Note (Signed)
He is currently prescribed valsartan 160 mg daily, labetalol 100 mg 2 times a day, clonidine 0.2 mg 2 times a day and furosemide 20 mg daily, hydralazine 10 mg 2 times a day as needed for blood pressures greater than 140/90. Patient has not taken the hydralazine, he says he has been out.  He says that he is also out of his lasix. Refilled his lasix discussed taking 20 mg in am and then can take second dose in afternoon if still having swelling.  Refill of hydralazine sent in and discussed taking for elevated blood pressure.  Will get labs,  discussed following up with cardiology. Patient verbalized understanding.

## 2023-01-22 NOTE — Progress Notes (Signed)
BP (!) 152/76   Pulse 84   Temp 98.4 F (36.9 C) (Oral)   Resp 16   Ht 6' (1.829 m)   Wt 227 lb 8 oz (103.2 kg)   SpO2 98%   BMI 30.85 kg/m    Subjective:    Patient ID: Jared Cline, male    DOB: 07/26/1946, 77 y.o.   MRN: [id number]  HPI: Jared Cline is a 77 y.o. male  Chief Complaint  Patient presents with   Edema    Bilateral feet for 3 weeks   Hypertension   Hypertension: his blood pressure today is 160/78.  He has been having elevated blood pressures.  He denies any chest pain, shortness of breath, headaches or blurred vision. He does report continued lower extremity edema.  He is currently prescribed valsartan 160 mg daily, labetalol 100 mg 2 times a day, clonidine 0.2 mg 2 times a day and furosemide 20 mg daily, hydralazine 10 mg 2 times a day as needed for blood pressures greater than 140/90. Patient has not taken the hydralazine, he says he has been out.  He says that he is also out of his lasix, took last dose yesterday. Refilled his lasix discussed taking 20 mg in am and then can take second dose in afternoon if still having swelling.  Refill of hydralazine sent in and discussed taking for elevated blood pressure.  Will get labs,  discussed following up with cardiology. Patient verbalized understanding.       01/22/2023   11:16 AM 01/22/2023   10:50 AM 01/21/2023    1:15 PM  Vitals with BMI  Height  6\' 0"    Weight  227 lbs 8 oz   BMI  30.85   Systolic 152 160 409  Diastolic 76 78 86  Pulse  84     Relevant past medical, surgical, family and social history reviewed and updated as indicated. Interim medical history since our last visit reviewed. Allergies and medications reviewed and updated.  Review of Systems  Constitutional: Negative for fever or weight change.  Respiratory: Negative for cough and shortness of breath.   Cardiovascular: Negative for chest pain or palpitations. Positive for lower extremity edema Gastrointestinal: Negative for abdominal pain, no  bowel changes.  Musculoskeletal: Negative for gait problem or joint swelling.  Skin: Negative for rash.  Neurological: Negative for dizziness or headache.  No other specific complaints in a complete review of systems (except as listed in HPI above).      Objective:    BP (!) 152/76   Pulse 84   Temp 98.4 F (36.9 C) (Oral)   Resp 16   Ht 6' (1.829 m)   Wt 227 lb 8 oz (103.2 kg)   SpO2 98%   BMI 30.85 kg/m   Wt Readings from Last 3 Encounters:  01/22/23 227 lb 8 oz (103.2 kg)  01/21/23 229 lb (103.9 kg)  01/07/23 227 lb (103 kg)    Physical Exam  Constitutional: Patient appears well-developed and well-nourished. Obese  No distress.  HEENT: head atraumatic, normocephalic, pupils equal and reactive to light, neck supple Cardiovascular: Normal rate, regular rhythm and normal heart sounds.  No murmur heard. 1+ BLE edema. Pulmonary/Chest: Effort normal and breath sounds normal. No respiratory distress. Abdominal: Soft.  There is no tenderness. Psychiatric: Patient has a normal mood and affect. behavior is normal. Judgment and thought content normal.  Results for orders placed or performed in visit on 01/07/23  CBC w/Diff/Platelet  Result Value Ref Range  WBC 6.2 3.8 - 10.8 Thousand/uL   RBC 3.86 (L) 4.20 - 5.80 Million/uL   Hemoglobin 10.9 (L) 13.2 - 17.1 g/dL   HCT 14.7 (L) 82.9 - 56.2 %   MCV 86.0 80.0 - 100.0 fL   MCH 28.2 27.0 - 33.0 pg   MCHC 32.8 32.0 - 36.0 g/dL   RDW 13.0 86.5 - 78.4 %   Platelets 321 140 - 400 Thousand/uL   MPV 9.6 7.5 - 12.5 fL   Neutro Abs 3,522 1,500 - 7,800 cells/uL   Lymphs Abs 2,114 850 - 3,900 cells/uL   Absolute Monocytes 422 200 - 950 cells/uL   Eosinophils Absolute 112 15 - 500 cells/uL   Basophils Absolute 31 0 - 200 cells/uL   Neutrophils Relative % 56.8 %   Total Lymphocyte 34.1 %   Monocytes Relative 6.8 %   Eosinophils Relative 1.8 %   Basophils Relative 0.5 %  COMPLETE METABOLIC PANEL WITH GFR  Result Value Ref Range    Glucose, Bld 92 65 - 99 mg/dL   BUN 20 7 - 25 mg/dL   Creat 6.96 (H) 2.95 - 1.28 mg/dL   eGFR 52 (L) > OR = 60 mL/min/1.26m2   BUN/Creatinine Ratio 14 6 - 22 (calc)   Sodium 140 135 - 146 mmol/L   Potassium 4.6 3.5 - 5.3 mmol/L   Chloride 105 98 - 110 mmol/L   CO2 27 20 - 32 mmol/L   Calcium 9.5 8.6 - 10.3 mg/dL   Total Protein 6.9 6.1 - 8.1 g/dL   Albumin 4.5 3.6 - 5.1 g/dL   Globulin 2.4 1.9 - 3.7 g/dL (calc)   AG Ratio 1.9 1.0 - 2.5 (calc)   Total Bilirubin 0.4 0.2 - 1.2 mg/dL   Alkaline phosphatase (APISO) 57 35 - 144 U/L   AST 17 10 - 35 U/L   ALT 11 9 - 46 U/L  Lipase  Result Value Ref Range   Lipase 28 7 - 60 U/L  Amylase  Result Value Ref Range   Amylase 84 21 - 101 U/L  Sed Rate (ESR)  Result Value Ref Range   Sed Rate 6 0 - 20 mm/h  C-reactive protein  Result Value Ref Range   CRP <3.0 <8.0 mg/L      Assessment & Plan:   Problem List Items Addressed This Visit       Cardiovascular and Mediastinum   Hypertension - Primary (Chronic)    He is currently prescribed valsartan 160 mg daily, labetalol 100 mg 2 times a day, clonidine 0.2 mg 2 times a day and furosemide 20 mg daily, hydralazine 10 mg 2 times a day as needed for blood pressures greater than 140/90. Patient has not taken the hydralazine, he says he has been out.  He says that he is also out of his lasix. Refilled his lasix discussed taking 20 mg in am and then can take second dose in afternoon if still having swelling.  Refill of hydralazine sent in and discussed taking for elevated blood pressure.  Will get labs,  discussed following up with cardiology. Patient verbalized understanding.       Relevant Medications   hydrALAZINE (APRESOLINE) 10 MG tablet   furosemide (LASIX) 20 MG tablet   Other Relevant Orders   Brain natriuretic peptide   COMPLETE METABOLIC PANEL WITH GFR   CBC with Differential/Platelet     Other   Lower extremity edema    Refilled his lasix discussed taking 20 mg in am and then  can take second dose in afternoon if still having swelling. Will get labs,  discussed following up with cardiology. Patient verbalized understanding.       Relevant Medications   furosemide (LASIX) 20 MG tablet   Other Relevant Orders   Brain natriuretic peptide   COMPLETE METABOLIC PANEL WITH GFR   CBC with Differential/Platelet     Follow up plan: Return for follow up.

## 2023-01-22 NOTE — Assessment & Plan Note (Signed)
Refilled his lasix discussed taking 20 mg in am and then can take second dose in afternoon if still having swelling. Will get labs,  discussed following up with cardiology. Patient verbalized understanding.

## 2023-01-23 DIAGNOSIS — G5602 Carpal tunnel syndrome, left upper limb: Secondary | ICD-10-CM | POA: Diagnosis not present

## 2023-01-23 DIAGNOSIS — I69351 Hemiplegia and hemiparesis following cerebral infarction affecting right dominant side: Secondary | ICD-10-CM | POA: Diagnosis not present

## 2023-01-23 DIAGNOSIS — N400 Enlarged prostate without lower urinary tract symptoms: Secondary | ICD-10-CM | POA: Diagnosis not present

## 2023-01-23 DIAGNOSIS — E785 Hyperlipidemia, unspecified: Secondary | ICD-10-CM | POA: Diagnosis not present

## 2023-01-23 DIAGNOSIS — I69393 Ataxia following cerebral infarction: Secondary | ICD-10-CM | POA: Diagnosis not present

## 2023-01-23 DIAGNOSIS — E669 Obesity, unspecified: Secondary | ICD-10-CM | POA: Diagnosis not present

## 2023-01-23 DIAGNOSIS — Z6830 Body mass index (BMI) 30.0-30.9, adult: Secondary | ICD-10-CM | POA: Diagnosis not present

## 2023-01-23 DIAGNOSIS — M170 Bilateral primary osteoarthritis of knee: Secondary | ICD-10-CM | POA: Diagnosis not present

## 2023-01-23 DIAGNOSIS — K581 Irritable bowel syndrome with constipation: Secondary | ICD-10-CM | POA: Diagnosis not present

## 2023-01-23 DIAGNOSIS — N1832 Chronic kidney disease, stage 3b: Secondary | ICD-10-CM | POA: Diagnosis not present

## 2023-01-23 DIAGNOSIS — I129 Hypertensive chronic kidney disease with stage 1 through stage 4 chronic kidney disease, or unspecified chronic kidney disease: Secondary | ICD-10-CM | POA: Diagnosis not present

## 2023-01-23 DIAGNOSIS — I2510 Atherosclerotic heart disease of native coronary artery without angina pectoris: Secondary | ICD-10-CM | POA: Diagnosis not present

## 2023-01-23 DIAGNOSIS — E871 Hypo-osmolality and hyponatremia: Secondary | ICD-10-CM | POA: Diagnosis not present

## 2023-01-23 DIAGNOSIS — G4733 Obstructive sleep apnea (adult) (pediatric): Secondary | ICD-10-CM | POA: Diagnosis not present

## 2023-01-23 DIAGNOSIS — I739 Peripheral vascular disease, unspecified: Secondary | ICD-10-CM | POA: Diagnosis not present

## 2023-01-23 DIAGNOSIS — M25461 Effusion, right knee: Secondary | ICD-10-CM | POA: Diagnosis not present

## 2023-01-23 LAB — CBC WITH DIFFERENTIAL/PLATELET
Basophils Absolute: 23 cells/uL (ref 0–200)
Basophils Relative: 0.3 %
Eosinophils Relative: 1 %
Hemoglobin: 11.4 g/dL — ABNORMAL LOW (ref 13.2–17.1)
MCH: 27.7 pg (ref 27.0–33.0)
MCV: 85.9 fL (ref 80.0–100.0)
Monocytes Relative: 7.5 %
Neutro Abs: 4563 cells/uL (ref 1500–7800)
Neutrophils Relative %: 58.5 %
Platelets: 361 10*3/uL (ref 140–400)
RDW: 12.2 % (ref 11.0–15.0)
Total Lymphocyte: 32.7 %
WBC: 7.8 10*3/uL (ref 3.8–10.8)

## 2023-01-23 LAB — COMPLETE METABOLIC PANEL WITH GFR
AG Ratio: 2 (calc) (ref 1.0–2.5)
ALT: 18 U/L (ref 9–46)
AST: 18 U/L (ref 10–35)
Albumin: 4.5 g/dL (ref 3.6–5.1)
Alkaline phosphatase (APISO): 53 U/L (ref 35–144)
BUN: 19 mg/dL (ref 7–25)
CO2: 26 mmol/L (ref 20–32)
Calcium: 10.1 mg/dL (ref 8.6–10.3)
Chloride: 99 mmol/L (ref 98–110)
Creat: 1.25 mg/dL (ref 0.70–1.28)
Globulin: 2.3 g/dL (calc) (ref 1.9–3.7)
Glucose, Bld: 93 mg/dL (ref 65–99)
Potassium: 4.5 mmol/L (ref 3.5–5.3)
Sodium: 134 mmol/L — ABNORMAL LOW (ref 135–146)
Total Bilirubin: 0.4 mg/dL (ref 0.2–1.2)
Total Protein: 6.8 g/dL (ref 6.1–8.1)
eGFR: 59 mL/min/{1.73_m2} — ABNORMAL LOW (ref >=60)

## 2023-01-23 LAB — BRAIN NATRIURETIC PEPTIDE: Brain Natriuretic Peptide: 65 pg/mL (ref <100)

## 2023-01-24 DIAGNOSIS — I739 Peripheral vascular disease, unspecified: Secondary | ICD-10-CM | POA: Diagnosis not present

## 2023-01-24 DIAGNOSIS — M25461 Effusion, right knee: Secondary | ICD-10-CM | POA: Diagnosis not present

## 2023-01-24 DIAGNOSIS — I69393 Ataxia following cerebral infarction: Secondary | ICD-10-CM | POA: Diagnosis not present

## 2023-01-24 DIAGNOSIS — N1832 Chronic kidney disease, stage 3b: Secondary | ICD-10-CM | POA: Diagnosis not present

## 2023-01-24 DIAGNOSIS — E785 Hyperlipidemia, unspecified: Secondary | ICD-10-CM | POA: Diagnosis not present

## 2023-01-24 DIAGNOSIS — G5602 Carpal tunnel syndrome, left upper limb: Secondary | ICD-10-CM | POA: Diagnosis not present

## 2023-01-24 DIAGNOSIS — I2510 Atherosclerotic heart disease of native coronary artery without angina pectoris: Secondary | ICD-10-CM | POA: Diagnosis not present

## 2023-01-24 DIAGNOSIS — E669 Obesity, unspecified: Secondary | ICD-10-CM | POA: Diagnosis not present

## 2023-01-24 DIAGNOSIS — M170 Bilateral primary osteoarthritis of knee: Secondary | ICD-10-CM | POA: Diagnosis not present

## 2023-01-24 DIAGNOSIS — E871 Hypo-osmolality and hyponatremia: Secondary | ICD-10-CM | POA: Diagnosis not present

## 2023-01-24 DIAGNOSIS — Z6830 Body mass index (BMI) 30.0-30.9, adult: Secondary | ICD-10-CM | POA: Diagnosis not present

## 2023-01-24 DIAGNOSIS — N400 Enlarged prostate without lower urinary tract symptoms: Secondary | ICD-10-CM | POA: Diagnosis not present

## 2023-01-24 DIAGNOSIS — K581 Irritable bowel syndrome with constipation: Secondary | ICD-10-CM | POA: Diagnosis not present

## 2023-01-24 DIAGNOSIS — I129 Hypertensive chronic kidney disease with stage 1 through stage 4 chronic kidney disease, or unspecified chronic kidney disease: Secondary | ICD-10-CM | POA: Diagnosis not present

## 2023-01-24 DIAGNOSIS — G4733 Obstructive sleep apnea (adult) (pediatric): Secondary | ICD-10-CM | POA: Diagnosis not present

## 2023-01-24 DIAGNOSIS — I69351 Hemiplegia and hemiparesis following cerebral infarction affecting right dominant side: Secondary | ICD-10-CM | POA: Diagnosis not present

## 2023-01-27 DIAGNOSIS — I639 Cerebral infarction, unspecified: Secondary | ICD-10-CM | POA: Diagnosis not present

## 2023-01-27 DIAGNOSIS — I6501 Occlusion and stenosis of right vertebral artery: Secondary | ICD-10-CM | POA: Diagnosis not present

## 2023-01-29 ENCOUNTER — Inpatient Hospital Stay (HOSPITAL_BASED_OUTPATIENT_CLINIC_OR_DEPARTMENT_OTHER): Payer: Medicare Other | Admitting: Internal Medicine

## 2023-01-29 ENCOUNTER — Other Ambulatory Visit: Payer: Self-pay

## 2023-01-29 ENCOUNTER — Ambulatory Visit: Payer: Medicare Other | Admitting: Nurse Practitioner

## 2023-01-29 ENCOUNTER — Inpatient Hospital Stay: Payer: Medicare Other | Attending: Internal Medicine

## 2023-01-29 ENCOUNTER — Encounter: Payer: Self-pay | Admitting: Internal Medicine

## 2023-01-29 VITALS — BP 190/62 | HR 85 | Temp 98.3°F | Ht 72.0 in | Wt 224.4 lb

## 2023-01-29 DIAGNOSIS — M25472 Effusion, left ankle: Secondary | ICD-10-CM | POA: Diagnosis not present

## 2023-01-29 DIAGNOSIS — Z8546 Personal history of malignant neoplasm of prostate: Secondary | ICD-10-CM | POA: Diagnosis not present

## 2023-01-29 DIAGNOSIS — I700 Atherosclerosis of aorta: Secondary | ICD-10-CM | POA: Diagnosis not present

## 2023-01-29 DIAGNOSIS — N183 Chronic kidney disease, stage 3 unspecified: Secondary | ICD-10-CM | POA: Diagnosis not present

## 2023-01-29 DIAGNOSIS — I129 Hypertensive chronic kidney disease with stage 1 through stage 4 chronic kidney disease, or unspecified chronic kidney disease: Secondary | ICD-10-CM | POA: Insufficient documentation

## 2023-01-29 DIAGNOSIS — I2510 Atherosclerotic heart disease of native coronary artery without angina pectoris: Secondary | ICD-10-CM | POA: Insufficient documentation

## 2023-01-29 DIAGNOSIS — I2699 Other pulmonary embolism without acute cor pulmonale: Secondary | ICD-10-CM

## 2023-01-29 DIAGNOSIS — R911 Solitary pulmonary nodule: Secondary | ICD-10-CM | POA: Insufficient documentation

## 2023-01-29 DIAGNOSIS — Z7901 Long term (current) use of anticoagulants: Secondary | ICD-10-CM | POA: Insufficient documentation

## 2023-01-29 DIAGNOSIS — N281 Cyst of kidney, acquired: Secondary | ICD-10-CM | POA: Diagnosis not present

## 2023-01-29 DIAGNOSIS — Z87891 Personal history of nicotine dependence: Secondary | ICD-10-CM | POA: Diagnosis not present

## 2023-01-29 DIAGNOSIS — M25471 Effusion, right ankle: Secondary | ICD-10-CM | POA: Diagnosis not present

## 2023-01-29 DIAGNOSIS — Z8673 Personal history of transient ischemic attack (TIA), and cerebral infarction without residual deficits: Secondary | ICD-10-CM | POA: Insufficient documentation

## 2023-01-29 LAB — CBC WITH DIFFERENTIAL/PLATELET
Abs Immature Granulocytes: 0.05 10*3/uL (ref 0.00–0.07)
Basophils Absolute: 0 10*3/uL (ref 0.0–0.1)
Basophils Relative: 1 %
Eosinophils Absolute: 0.1 10*3/uL (ref 0.0–0.5)
Eosinophils Relative: 1 %
HCT: 35 % — ABNORMAL LOW (ref 39.0–52.0)
Hemoglobin: 11.2 g/dL — ABNORMAL LOW (ref 13.0–17.0)
Immature Granulocytes: 1 %
Lymphocytes Relative: 23 %
Lymphs Abs: 1.8 10*3/uL (ref 0.7–4.0)
MCH: 27.9 pg (ref 26.0–34.0)
MCHC: 32 g/dL (ref 30.0–36.0)
MCV: 87.3 fL (ref 80.0–100.0)
Monocytes Absolute: 0.4 10*3/uL (ref 0.1–1.0)
Monocytes Relative: 6 %
Neutro Abs: 5.3 10*3/uL (ref 1.7–7.7)
Neutrophils Relative %: 68 %
Platelets: 316 10*3/uL (ref 150–400)
RBC: 4.01 MIL/uL — ABNORMAL LOW (ref 4.22–5.81)
RDW: 12.2 % (ref 11.5–15.5)
WBC: 7.7 10*3/uL (ref 4.0–10.5)
nRBC: 0 % (ref 0.0–0.2)

## 2023-01-29 LAB — COMPREHENSIVE METABOLIC PANEL
ALT: 15 U/L (ref 0–44)
AST: 24 U/L (ref 15–41)
Albumin: 4.1 g/dL (ref 3.5–5.0)
Alkaline Phosphatase: 58 U/L (ref 38–126)
Anion gap: 10 (ref 5–15)
BUN: 22 mg/dL (ref 8–23)
CO2: 24 mmol/L (ref 22–32)
Calcium: 8.1 mg/dL — ABNORMAL LOW (ref 8.9–10.3)
Chloride: 103 mmol/L (ref 98–111)
Creatinine, Ser: 1.4 mg/dL — ABNORMAL HIGH (ref 0.61–1.24)
GFR, Estimated: 51 mL/min — ABNORMAL LOW (ref >60)
Glucose, Bld: 131 mg/dL — ABNORMAL HIGH (ref 70–99)
Potassium: 3.9 mmol/L (ref 3.5–5.1)
Sodium: 137 mmol/L (ref 135–145)
Total Bilirubin: 0.2 mg/dL — ABNORMAL LOW (ref 0.3–1.2)
Total Protein: 6.9 g/dL (ref 6.5–8.1)

## 2023-01-29 LAB — IRON AND TIBC
Iron: 46 ug/dL (ref 45–182)
Saturation Ratios: 11 % — ABNORMAL LOW (ref 17.9–39.5)
TIBC: 402 ug/dL (ref 250–450)
UIBC: 356 ug/dL

## 2023-01-29 LAB — FERRITIN: Ferritin: 14 ng/mL — ABNORMAL LOW (ref 24–336)

## 2023-01-29 MED ORDER — APIXABAN 5 MG PO TABS
5.0000 mg | ORAL_TABLET | Freq: Two times a day (BID) | ORAL | 3 refills | Status: DC
Start: 2023-01-29 — End: 2023-02-13

## 2023-01-29 NOTE — Progress Notes (Signed)
Pt had carotid stent put in since last visit, increase in lasix and placed on eliquis.  C/o swelling in feet and tingling in fingers since the swelling in his feet and ankles.

## 2023-01-29 NOTE — Progress Notes (Signed)
[facility] CONSULT NOTE  Patient Care Team: Berniece Salines, FNP as PCP - General (Nurse Practitioner) Lamar Blinks, MD as Consulting Physician (Cardiology) Lorain Childes, MD as Consulting Physician (Nephrology) Sondra Come, MD as Consulting Physician (Urology)  CHIEF COMPLAINTS/PURPOSE OF CONSULTATION: DVT/PE  # IMPRESSION: 1. Exam is positive for acute pulmonary embolus within segmental branch of the right upper lobe pulmonary artery and segmental branch of the left lower lobe pulmonary artery. 2. Mild mosaic attenuation of the lungs which may reflect small airways disease. 3. Multiple complicated renal cysts, reference abdominal MRI with follow-up recommendations 07/09/2021. 4. Coronary artery calcifications noted. 5. 5 mm right upper lobe pulmonary nodule is stable from 2019 and is considered benign. 6.  Aortic Atherosclerosis (ICD10-I70.0).     Electronically Signed   By: Signa Kell M.D.   On: 08/25/2022 08:57 Oncology History   No history exists.    HISTORY OF PRESENTING ILLNESS: Patient ambulating-with assistance/walker.  Accompanied by family.   Jared Cline 77 y.o.  male history of poorly controlled blood pressure; history of stroke;  chronic kidney disease and Hx of PE - unprovoked on eliquis is here for a follow up.  Pt had carotid stent put in since last visit, increase in lasix and  continues to be on  eliquis.  Denies any nosebleeds or gum bleeding.  Denies any rectal bleeding.  Denies any falls.   C/o swelling in feet and tingling in fingers since the swelling in his feet and ankles .  Patient is currently discharged from rehab.  He is currently working with physical therapy.  Review of Systems  Constitutional:  Positive for malaise/fatigue. Negative for chills, diaphoresis, fever and weight loss.  HENT:  Negative for nosebleeds and sore throat.   Eyes:  Negative for double vision.  Respiratory:  Negative for cough, hemoptysis,  sputum production, shortness of breath and wheezing.   Cardiovascular:  Negative for chest pain, palpitations, orthopnea and leg swelling.  Gastrointestinal:  Negative for abdominal pain, blood in stool, constipation, diarrhea, heartburn, melena, nausea and vomiting.  Genitourinary:  Negative for dysuria, frequency and urgency.  Musculoskeletal:  Positive for back pain and joint pain.  Skin: Negative.  Negative for itching and rash.  Neurological:  Positive for weakness. Negative for dizziness, tingling, focal weakness and headaches.  Endo/Heme/Allergies:  Does not bruise/bleed easily.  Psychiatric/Behavioral:  Negative for depression. The patient is not nervous/anxious and does not have insomnia.      MEDICAL HISTORY:  Past Medical History:  Diagnosis Date   BPH (benign prostatic hypertrophy)    Bradycardia    evaluated by Dr. Gwen Pounds   Cancer [facility])    prostate   Carpal tunnel syndrome, left    left arm   CKD (chronic kidney disease) stage 2, GFR 60-89 ml/min    Diverticulitis    Gastritis 2017   History of prostate cancer    HNP (herniated nucleus pulposus), lumbar    Hyperlipidemia    Hypertension    IBS (irritable bowel syndrome)    Impotence    Multiple lung nodules    nonspecific, largest 5mm in Jan 2016; scanned March 2017; rescan 3-6 months later   OA (osteoarthritis) of knee    right   Renal mass    evaluated; followed by urologist   Sleep apnea    on CPAP   Stroke (HCC)    Torn meniscus    x 2; right knee    SURGICAL HISTORY: Past Surgical History:  Procedure Laterality Date   abdominal ultrasound  Oct 2015   Negative for eval for AAA   CAROTID PTA/STENT INTERVENTION Right 11/26/2022   Procedure: CAROTID PTA/STENT INTERVENTION;  Surgeon: Renford Dills, MD;  Location: [facility] INVASIVE CV LAB;  Service: Cardiovascular;  Laterality: Right;   COLONOSCOPY     KNEE ARTHROSCOPY Right    x 2   LUMBAR LAMINECTOMY     PROSTATECTOMY     robotic    SOCIAL  HISTORY: Social History   Socioeconomic History   Marital status: Married    Spouse name: Baird Holmon   Number of children: 3   Years of education: Not on file   Highest education level: Not on file  Occupational History   Occupation: retired city of [employer]  Tobacco Use   Smoking status: Former    Packs/day: 0.50    Years: 9.00    Additional pack years: 0.00    Total pack years: 4.50    Types: Cigarettes    Quit date: 08/20/1971    Years since quitting: 51.4    Passive exposure: Past   Smokeless tobacco: Never  Vaping Use   Vaping Use: Never used  Substance and Sexual Activity   Alcohol use: No    Alcohol/week: 0.0 standard drinks of alcohol   Drug use: No   Sexual activity: Not Currently  Other Topics Concern   Not on file  Social History Narrative   Retired from city of [place] 2012.   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    Social Determinants of Health   Financial Resource Strain: Low Risk  (07/09/2022)   Overall Financial Resource Strain (CARDIA)    Difficulty of Paying Living Expenses: Not hard at all  Food Insecurity: No Food Insecurity (12/10/2022)   Hunger Vital Sign    Worried About Running Out of Food in the Last Year: Never true    Ran Out of Food in the Last Year: Never true  Recent Concern: Food Insecurity - Food Insecurity Present (09/30/2022)   Hunger Vital Sign    Worried About Running Out of Food in the Last Year: Never true    Ran Out of Food in the Last Year: Sometimes true  Transportation Needs: No Transportation Needs (12/10/2022)   PRAPARE - Administrator, Civil Service (Medical): No    Lack of Transportation (Non-Medical): No  Physical Activity: Inactive (07/05/2021)   Exercise Vital Sign    Days of Exercise per Week: 0 days    Minutes of Exercise per Session: 0 min  Stress: No Stress Concern Present (07/09/2022)   [employer] of Occupational Health - Occupational Stress Questionnaire    Feeling of  Stress : Not at all  Social Connections: Socially Integrated (07/09/2022)   Social Connection and Isolation Panel [NHANES]    Frequency of Communication with Friends and Family: Three times a week    Frequency of Social Gatherings with Friends and Family: Once a week    Attends Religious Services: More than 4 times per year    Active Member of [employer] or Organizations: Not on file    Attends Banker Meetings: More than 4 times per year    Marital Status: Married  Catering manager Violence: Not At Risk (12/06/2022)   Humiliation, Afraid, Rape, and Kick questionnaire    Fear of Current or Ex-Partner: No    Emotionally Abused: No    Physically Abused: No    Sexually Abused: No  FAMILY HISTORY: Family History  Problem Relation Age of Onset   Stroke Mother    Hypertension Mother    Aneurysm Father        cerebral   Hypertension Father    Heart disease Paternal Uncle    Alcohol abuse Brother    Cancer Neg Hx    COPD Neg Hx    Diabetes Neg Hx     ALLERGIES:  is allergic to flomax [tamsulosin hcl], ace inhibitors, amlodipine, doxazosin, and lipitor [atorvastatin].  MEDICATIONS:  Current Outpatient Medications  Medication Sig Dispense Refill   acetaminophen (TYLENOL) 650 MG CR tablet Take 2 tablets (1,300 mg total) by mouth every 12 (twelve) hours as needed (Mild pain).     aspirin EC 81 MG tablet Take 1 tablet (81 mg total) by mouth daily at 6 (six) AM. Swallow whole. 30 tablet 11   cholecalciferol (VITAMIN D3) 25 MCG (1000 UNIT) tablet Take 1,000 Units by mouth daily.     CINNAMON PO Take by mouth.     cloNIDine (CATAPRES) 0.2 MG tablet Take 1 tablet (0.2 mg total) by mouth 2 (two) times daily. 180 tablet 1   clopidogrel (PLAVIX) 75 MG tablet Take 1 tablet (75 mg total) by mouth daily. 30 tablet 11   cyanocobalamin (VITAMIN B12) 1000 MCG tablet Take 1,000 mcg by mouth daily.     diclofenac Sodium (VOLTAREN) 1 % GEL Apply 2 g topically 4 (four) times daily. 400 g 0    furosemide (LASIX) 20 MG tablet Take 1 tablet in the am, and take 1 tablet in the afternoon as needed for swelling in legs 60 tablet 0   GARLIC PO Take by mouth.     hydrALAZINE (APRESOLINE) 10 MG tablet TAKE 1 TABLET BY MOUTH TWICE DAILY AS NEEDED; TAKE IF BLOOD PRESSURE GREATER THAN 140/ 90 90 tablet 1   labetalol (NORMODYNE) 100 MG tablet 100 mg 2 (two) times daily.     pravastatin (PRAVACHOL) 80 MG tablet Take 1 tablet (80 mg total) by mouth at bedtime. 90 tablet 1   senna-docusate (SENOKOT-S) 8.6-50 MG tablet Take 3 tablets by mouth daily at 6 (six) AM. 90 tablet 0   valsartan (DIOVAN) 160 MG tablet Take 1 tablet (160 mg total) by mouth daily.     apixaban (ELIQUIS) 5 MG TABS tablet Take 1 tablet (5 mg total) by mouth 2 (two) times daily. 60 tablet 3   No current facility-administered medications for this visit.       PHYSICAL EXAMINATION:  Vitals:   01/29/23 1017  BP: (!) 190/62  Pulse: 85  Temp: 98.3 F (36.8 C)  SpO2: 100%   Filed Weights   01/29/23 1017  Weight: 224 lb 6.4 oz (101.8 kg)    Physical Exam Vitals and nursing note reviewed.  HENT:     Head: Normocephalic and atraumatic.     Mouth/Throat:     Pharynx: Oropharynx is clear.  Eyes:     Extraocular Movements: Extraocular movements intact.     Pupils: Pupils are equal, round, and reactive to light.  Cardiovascular:     Rate and Rhythm: Normal rate and regular rhythm.  Pulmonary:     Comments: Decreased breath sounds bilaterally.  Abdominal:     Palpations: Abdomen is soft.  Musculoskeletal:        General: Normal range of motion.     Cervical back: Normal range of motion.  Skin:    General: Skin is warm.  Neurological:     General:  No focal deficit present.     Mental Status: He is alert and oriented to person, place, and time.  Psychiatric:        Behavior: Behavior normal.        Judgment: Judgment normal.      LABORATORY DATA:  I have reviewed the data as listed Lab Results  Component  Value Date   WBC 7.7 01/29/2023   HGB 11.2 (L) 01/29/2023   HCT 35.0 (L) 01/29/2023   MCV 87.3 01/29/2023   PLT 316 01/29/2023   Recent Labs    08/24/22 0347 08/24/22 0557 08/30/22 0556 09/02/22 0627 09/15/22 1006 11/26/22 0955 11/27/22 0557 12/05/22 1408 01/07/23 1538 01/22/23 1114 01/29/23 1016  NA  --    < > 130*   < > 132*  --  136 134* 140 134* 137  K  --    < > 4.3   < > 3.7  --  4.3 3.9 4.6 4.5 3.9  CL  --    < > 100   < > 105  --  106 104 105 99 103  CO2  --    < > 21*   < > 24  --  24 23 27 26 24   GLUCOSE  --    < > 94   < > 97  --  91 95 92 93 131*  BUN  --    < > 24*   < > 12   < > 19 17 20 19 22   CREATININE  --    < > 1.22   < > 1.16   < > 1.34* 1.16 1.39* 1.25 1.40*  CALCIUM  --    < > 9.1   < > 9.3  --  9.1 9.2 9.5 10.1 8.1*  GFRNONAA  --    < > >60   < > >60   < > 54* >60  --   --  51*  PROT 7.2  --  6.8  --  7.1  --   --   --  6.9 6.8 6.9  ALBUMIN 3.9  --  3.4*  --  3.8  --   --   --   --   --  4.1  AST 17  --  12*  --  17  --   --   --  17 18 24   ALT 9  --  8  --  12  --   --   --  11 18 15   ALKPHOS 50  --  49  --  81  --   --   --   --   --  58  BILITOT 0.9  --  0.7  --  0.5  --   --   --  0.4 0.4 0.2*  BILIDIR 0.3*  --   --   --   --   --   --   --   --   --   --   IBILI 0.6  --   --   --   --   --   --   --   --   --   --    < > = values in this interval not displayed.    RADIOGRAPHIC STUDIES: I have personally reviewed the radiological images as listed and agreed with the findings in the report. VAS US CAROTID  Result Date: 01/01/2023 Carotid Arterial Duplex Study Patient Name:  Moishy Barrell  Date of Exam:  12/30/2022 Medical Rec #: [id number]    Accession #:    [id number] Date of Birth: 1946-02-12     Patient Gender: M Patient Age:   6 years Exam Location:  [facility] Procedure:      VAS US CAROTID Referring Phys: Levora Dredge --------------------------------------------------------------------------------  Indications:       TIA,  Carotid artery disease and Right stent. Other Factors:     Rt ICA stent placed 11/26/2022. Limitations        Today's exam was limited due to the high bifurcation of the                    carotid and the body habitus of the patient. Comparison Study:  cta 09/15/2022 Performing Technologist: Salvadore Farber RVT  Examination Guidelines: A complete evaluation includes B-mode imaging, spectral Doppler, color Doppler, and power Doppler as needed of all accessible portions of each vessel. Bilateral testing is considered an integral part of a complete examination. Limited examinations for reoccurring indications may be performed as noted.  Right Carotid Findings: +----------+--------+--------+--------+------------------+--------+           PSV cm/sEDV cm/sStenosisPlaque DescriptionComments +----------+--------+--------+--------+------------------+--------+ CCA Prox  57      11                                         +----------+--------+--------+--------+------------------+--------+ CCA Mid   62      12                                         +----------+--------+--------+--------+------------------+--------+ CCA Distal73      11                                stent    +----------+--------+--------+--------+------------------+--------+ ICA Prox  76      13                                stent    +----------+--------+--------+--------+------------------+--------+ ICA Mid   79      14                                         +----------+--------+--------+--------+------------------+--------+ ICA Distal75      16                                         +----------+--------+--------+--------+------------------+--------+ ECA       65      6                                          +----------+--------+--------+--------+------------------+--------+ +----------+--------+-------+----------------+-------------------+           PSV cm/sEDV cmsDescribe        Arm Pressure (mmHG)  +----------+--------+-------+----------------+-------------------+ [id number]             Multiphasic, WNL                    +----------+--------+-------+----------------+-------------------+ +---------+--------+--+--------+-+---------+  VertebralPSV cm/s34EDV cm/s6Antegrade +---------+--------+--+--------+-+---------+  Left Carotid Findings: +----------+--------+--------+--------+------------------+--------------+           PSV cm/sEDV cm/sStenosisPlaque DescriptionComments       +----------+--------+--------+--------+------------------+--------------+ CCA Prox  82      12                                               +----------+--------+--------+--------+------------------+--------------+ CCA Mid   92      11                                               +----------+--------+--------+--------+------------------+--------------+ CCA Distal95      9                                                +----------+--------+--------+--------+------------------+--------------+ ICA Prox  74      12                                               +----------+--------+--------+--------+------------------+--------------+ ICA Mid   66      13                                               +----------+--------+--------+--------+------------------+--------------+ ICA Distal                                          Not visualized +----------+--------+--------+--------+------------------+--------------+ ECA       67      13                                               +----------+--------+--------+--------+------------------+--------------+ +----------+--------+--------+----------------+-------------------+           PSV cm/sEDV cm/sDescribe        Arm Pressure (mmHG) +----------+--------+--------+----------------+-------------------+ [id number]             Multiphasic, WNL                     +----------+--------+--------+----------------+-------------------+ +---------+--------+--+--------+-+---------------------------------------------+ VertebralPSV cm/s18EDV cm/s45mid near occluded, distal not seen. CTA                                   suggested it was occluded                     +---------+--------+--+--------+-+---------------------------------------------+   Summary: Right Carotid: Velocities in the right ICA are consistent with a 1-39% stenosis.                The ECA appears <50% stenosed. Patent ICA stent with no evidence  of significant re-stenosis. Left Carotid: Hemodynamically significant plaque >50% visualized in the CCA. The               ECA appears <50% stenosed. Only the very proximal ICA is               visualized with mild stenosis. CTA suggested 60% but cannot               confirm by ultrasound. CCA waveforms do not suggest ICA occlusion.               The CCA throughout shows moderate homogeneous plaque and               approximately 50% stenosis by duplex. Vertebrals:  Right vertebral artery demonstrates antegrade flow. Left vertebral              artery was not visualized. Subclavians: Left subclavian artery was not visualized. Normal flow hemodynamics              were seen in bilateral subclavian arteries. *See table(s) above for measurements and observations.  Electronically signed by Levora Dredge MD on 01/01/2023 at 7:40:53 AM.    Final     ASSESSMENT & PLAN:   Acute pulmonary embolism without acute cor pulmonale (HCC) # JAN 7th, 2024-Unprovoked- Acute pulmonary embolus within segmental branch of the right upper lobe pulmonary artery and segmental branch of the left lower lobe pulmonary artery. On Eliquis  # in general, would recommend anticoagulation for in 6 to 12 months.  On apsrin + plavix-monitor closely for bleeding.  If concerning would recommend stopping Eliquis.  # Mild anemia Hb 11-12- ? Sec to CKD- recommend Iron  studies/ferritin-. Start PO iron. #Recommend gentle iron [iron biglycinate; 28 mg ] 1 pill a day.  This pill is unlikely to cause stomach upset or cause constipation.    # Cerebellar stroke- JAN 2024 [facility] [place]; Dr.Shah- AUG 2023]- S/p right carotid stenting- [april 2024; Dr.Schneir] Also- noted to have Severe, bilateral basilar artery occlusion, occluded left vertebral artery, moderate right vertebral artery stenosis- waiting -  has appointment with Dr Arlyss Queen at [facility] neurosurgery. On apsrin + plavix-monitor closely for bleeding.  If concerning would recommend stopping Eliquis.  # Bil LE swelling/edema- recommend continue lasix/ and bil LE stockings.   # Poorly controlled BP- recommend checking Blood pressures at home.   # CKD [Dr.Kolluru]- III - recommend avoiding nephrotoxic NSAIDs-Advil/Motrin. Stable.   # DISPOSITION: #  add iron studies;ferritin to today's labs-  # follow up in 3 months; MD; labs- cbc/cmp-iron studies;ferritin  Dr.B.   All questions were answered. The patient knows to call the clinic with any problems, questions or concerns.    Earna Coder, MD 01/29/2023 12:02 PM

## 2023-01-29 NOTE — Assessment & Plan Note (Addendum)
#   JAN 7th, 2024-Unprovoked- Acute pulmonary embolus within segmental branch of the right upper lobe pulmonary artery and segmental branch of the left lower lobe pulmonary artery. On Eliquis  # in general, would recommend anticoagulation for in 6 to 12 months.  On apsrin + plavix-monitor closely for bleeding.  If concerning would recommend stopping Eliquis.  # Mild anemia Hb 11-12- ? Sec to CKD- recommend Iron studies/ferritin-. Start PO iron. #Recommend gentle iron [iron biglycinate; 28 mg ] 1 pill a day.  This pill is unlikely to cause stomach upset or cause constipation.    # Cerebellar stroke- JAN 2024 [facility]; Dr.Shah- AUG 2023]- S/p right carotid stenting- [april 2024; Dr.Schneir] Also- noted to have Severe, bilateral basilar artery occlusion, occluded left vertebral artery, moderate right vertebral artery stenosis- waiting -  has appointment with Dr Arlyss Queen at [facility] neurosurgery. On apsrin + plavix-monitor closely for bleeding.  If concerning would recommend stopping Eliquis.  # Bil LE swelling/edema- recommend continue lasix/ and bil LE stockings.   # Poorly controlled BP- recommend checking Blood pressures at home.   # CKD [Dr.Kolluru]- III - recommend avoiding nephrotoxic NSAIDs-Advil/Motrin. Stable.   # DISPOSITION: #  add iron studies;ferritin to today's labs-  # follow up in 3 months; MD; labs- cbc/cmp-iron studies;ferritin  Dr.B.

## 2023-01-29 NOTE — Patient Instructions (Signed)
#  Recommend gentle iron [iron biglycinate; 28 mg ] 1 pill a day.  This pill is unlikely to cause stomach upset or cause constipation.  

## 2023-01-30 DIAGNOSIS — K581 Irritable bowel syndrome with constipation: Secondary | ICD-10-CM | POA: Diagnosis not present

## 2023-01-30 DIAGNOSIS — I69393 Ataxia following cerebral infarction: Secondary | ICD-10-CM | POA: Diagnosis not present

## 2023-01-30 DIAGNOSIS — G5602 Carpal tunnel syndrome, left upper limb: Secondary | ICD-10-CM | POA: Diagnosis not present

## 2023-01-30 DIAGNOSIS — I2510 Atherosclerotic heart disease of native coronary artery without angina pectoris: Secondary | ICD-10-CM | POA: Diagnosis not present

## 2023-01-30 DIAGNOSIS — I739 Peripheral vascular disease, unspecified: Secondary | ICD-10-CM | POA: Diagnosis not present

## 2023-01-30 DIAGNOSIS — N400 Enlarged prostate without lower urinary tract symptoms: Secondary | ICD-10-CM | POA: Diagnosis not present

## 2023-01-30 DIAGNOSIS — I69351 Hemiplegia and hemiparesis following cerebral infarction affecting right dominant side: Secondary | ICD-10-CM | POA: Diagnosis not present

## 2023-01-30 DIAGNOSIS — E785 Hyperlipidemia, unspecified: Secondary | ICD-10-CM | POA: Diagnosis not present

## 2023-01-30 DIAGNOSIS — E871 Hypo-osmolality and hyponatremia: Secondary | ICD-10-CM | POA: Diagnosis not present

## 2023-01-30 DIAGNOSIS — N1832 Chronic kidney disease, stage 3b: Secondary | ICD-10-CM | POA: Diagnosis not present

## 2023-01-30 DIAGNOSIS — M170 Bilateral primary osteoarthritis of knee: Secondary | ICD-10-CM | POA: Diagnosis not present

## 2023-01-30 DIAGNOSIS — G4733 Obstructive sleep apnea (adult) (pediatric): Secondary | ICD-10-CM | POA: Diagnosis not present

## 2023-01-30 DIAGNOSIS — E669 Obesity, unspecified: Secondary | ICD-10-CM | POA: Diagnosis not present

## 2023-01-30 DIAGNOSIS — Z6830 Body mass index (BMI) 30.0-30.9, adult: Secondary | ICD-10-CM | POA: Diagnosis not present

## 2023-01-30 DIAGNOSIS — I129 Hypertensive chronic kidney disease with stage 1 through stage 4 chronic kidney disease, or unspecified chronic kidney disease: Secondary | ICD-10-CM | POA: Diagnosis not present

## 2023-01-30 DIAGNOSIS — M25461 Effusion, right knee: Secondary | ICD-10-CM | POA: Diagnosis not present

## 2023-01-31 DIAGNOSIS — I739 Peripheral vascular disease, unspecified: Secondary | ICD-10-CM | POA: Diagnosis not present

## 2023-01-31 DIAGNOSIS — I69393 Ataxia following cerebral infarction: Secondary | ICD-10-CM | POA: Diagnosis not present

## 2023-01-31 DIAGNOSIS — K581 Irritable bowel syndrome with constipation: Secondary | ICD-10-CM | POA: Diagnosis not present

## 2023-01-31 DIAGNOSIS — E785 Hyperlipidemia, unspecified: Secondary | ICD-10-CM | POA: Diagnosis not present

## 2023-01-31 DIAGNOSIS — M170 Bilateral primary osteoarthritis of knee: Secondary | ICD-10-CM | POA: Diagnosis not present

## 2023-01-31 DIAGNOSIS — G4733 Obstructive sleep apnea (adult) (pediatric): Secondary | ICD-10-CM | POA: Diagnosis not present

## 2023-01-31 DIAGNOSIS — N1832 Chronic kidney disease, stage 3b: Secondary | ICD-10-CM | POA: Diagnosis not present

## 2023-01-31 DIAGNOSIS — I2510 Atherosclerotic heart disease of native coronary artery without angina pectoris: Secondary | ICD-10-CM | POA: Diagnosis not present

## 2023-01-31 DIAGNOSIS — M25461 Effusion, right knee: Secondary | ICD-10-CM | POA: Diagnosis not present

## 2023-01-31 DIAGNOSIS — G5602 Carpal tunnel syndrome, left upper limb: Secondary | ICD-10-CM | POA: Diagnosis not present

## 2023-01-31 DIAGNOSIS — Z6830 Body mass index (BMI) 30.0-30.9, adult: Secondary | ICD-10-CM | POA: Diagnosis not present

## 2023-01-31 DIAGNOSIS — I129 Hypertensive chronic kidney disease with stage 1 through stage 4 chronic kidney disease, or unspecified chronic kidney disease: Secondary | ICD-10-CM | POA: Diagnosis not present

## 2023-01-31 DIAGNOSIS — E669 Obesity, unspecified: Secondary | ICD-10-CM | POA: Diagnosis not present

## 2023-01-31 DIAGNOSIS — E871 Hypo-osmolality and hyponatremia: Secondary | ICD-10-CM | POA: Diagnosis not present

## 2023-01-31 DIAGNOSIS — I69351 Hemiplegia and hemiparesis following cerebral infarction affecting right dominant side: Secondary | ICD-10-CM | POA: Diagnosis not present

## 2023-01-31 DIAGNOSIS — N400 Enlarged prostate without lower urinary tract symptoms: Secondary | ICD-10-CM | POA: Diagnosis not present

## 2023-02-05 ENCOUNTER — Telehealth: Payer: Self-pay | Admitting: Nurse Practitioner

## 2023-02-05 DIAGNOSIS — I69351 Hemiplegia and hemiparesis following cerebral infarction affecting right dominant side: Secondary | ICD-10-CM | POA: Diagnosis not present

## 2023-02-05 DIAGNOSIS — I129 Hypertensive chronic kidney disease with stage 1 through stage 4 chronic kidney disease, or unspecified chronic kidney disease: Secondary | ICD-10-CM | POA: Diagnosis not present

## 2023-02-05 DIAGNOSIS — G4733 Obstructive sleep apnea (adult) (pediatric): Secondary | ICD-10-CM | POA: Diagnosis not present

## 2023-02-05 DIAGNOSIS — M170 Bilateral primary osteoarthritis of knee: Secondary | ICD-10-CM | POA: Diagnosis not present

## 2023-02-05 DIAGNOSIS — Z6830 Body mass index (BMI) 30.0-30.9, adult: Secondary | ICD-10-CM | POA: Diagnosis not present

## 2023-02-05 DIAGNOSIS — E669 Obesity, unspecified: Secondary | ICD-10-CM | POA: Diagnosis not present

## 2023-02-05 DIAGNOSIS — I69393 Ataxia following cerebral infarction: Secondary | ICD-10-CM | POA: Diagnosis not present

## 2023-02-05 DIAGNOSIS — N400 Enlarged prostate without lower urinary tract symptoms: Secondary | ICD-10-CM | POA: Diagnosis not present

## 2023-02-05 DIAGNOSIS — E785 Hyperlipidemia, unspecified: Secondary | ICD-10-CM | POA: Diagnosis not present

## 2023-02-05 DIAGNOSIS — G5602 Carpal tunnel syndrome, left upper limb: Secondary | ICD-10-CM | POA: Diagnosis not present

## 2023-02-05 DIAGNOSIS — I739 Peripheral vascular disease, unspecified: Secondary | ICD-10-CM | POA: Diagnosis not present

## 2023-02-05 DIAGNOSIS — K581 Irritable bowel syndrome with constipation: Secondary | ICD-10-CM | POA: Diagnosis not present

## 2023-02-05 DIAGNOSIS — N1832 Chronic kidney disease, stage 3b: Secondary | ICD-10-CM | POA: Diagnosis not present

## 2023-02-05 DIAGNOSIS — M25461 Effusion, right knee: Secondary | ICD-10-CM | POA: Diagnosis not present

## 2023-02-05 DIAGNOSIS — I2510 Atherosclerotic heart disease of native coronary artery without angina pectoris: Secondary | ICD-10-CM | POA: Diagnosis not present

## 2023-02-05 DIAGNOSIS — E871 Hypo-osmolality and hyponatremia: Secondary | ICD-10-CM | POA: Diagnosis not present

## 2023-02-05 NOTE — Telephone Encounter (Signed)
Home Health Verbal Orders - Caller/Agency: Thayer Ohm from [facility] Callback Number: [phone] Requesting OT/PT/Skilled Nursing/Social Work/Speech Therapy: PT Frequency: 1w9,

## 2023-02-06 DIAGNOSIS — G4733 Obstructive sleep apnea (adult) (pediatric): Secondary | ICD-10-CM | POA: Diagnosis not present

## 2023-02-07 DIAGNOSIS — I739 Peripheral vascular disease, unspecified: Secondary | ICD-10-CM | POA: Diagnosis not present

## 2023-02-07 DIAGNOSIS — I639 Cerebral infarction, unspecified: Secondary | ICD-10-CM | POA: Diagnosis not present

## 2023-02-07 DIAGNOSIS — Z7982 Long term (current) use of aspirin: Secondary | ICD-10-CM | POA: Diagnosis not present

## 2023-02-07 DIAGNOSIS — I129 Hypertensive chronic kidney disease with stage 1 through stage 4 chronic kidney disease, or unspecified chronic kidney disease: Secondary | ICD-10-CM | POA: Diagnosis not present

## 2023-02-07 DIAGNOSIS — M25461 Effusion, right knee: Secondary | ICD-10-CM | POA: Diagnosis not present

## 2023-02-07 DIAGNOSIS — Z6830 Body mass index (BMI) 30.0-30.9, adult: Secondary | ICD-10-CM | POA: Diagnosis not present

## 2023-02-07 DIAGNOSIS — E871 Hypo-osmolality and hyponatremia: Secondary | ICD-10-CM | POA: Diagnosis not present

## 2023-02-07 DIAGNOSIS — G4733 Obstructive sleep apnea (adult) (pediatric): Secondary | ICD-10-CM | POA: Diagnosis not present

## 2023-02-07 DIAGNOSIS — K581 Irritable bowel syndrome with constipation: Secondary | ICD-10-CM | POA: Diagnosis not present

## 2023-02-07 DIAGNOSIS — G5602 Carpal tunnel syndrome, left upper limb: Secondary | ICD-10-CM | POA: Diagnosis not present

## 2023-02-07 DIAGNOSIS — N400 Enlarged prostate without lower urinary tract symptoms: Secondary | ICD-10-CM | POA: Diagnosis not present

## 2023-02-07 DIAGNOSIS — E785 Hyperlipidemia, unspecified: Secondary | ICD-10-CM | POA: Diagnosis not present

## 2023-02-07 DIAGNOSIS — Z7902 Long term (current) use of antithrombotics/antiplatelets: Secondary | ICD-10-CM | POA: Diagnosis not present

## 2023-02-07 DIAGNOSIS — E669 Obesity, unspecified: Secondary | ICD-10-CM | POA: Diagnosis not present

## 2023-02-07 DIAGNOSIS — I70219 Atherosclerosis of native arteries of extremities with intermittent claudication, unspecified extremity: Secondary | ICD-10-CM | POA: Diagnosis not present

## 2023-02-07 DIAGNOSIS — N1832 Chronic kidney disease, stage 3b: Secondary | ICD-10-CM | POA: Diagnosis not present

## 2023-02-07 DIAGNOSIS — I69393 Ataxia following cerebral infarction: Secondary | ICD-10-CM | POA: Diagnosis not present

## 2023-02-07 DIAGNOSIS — I2699 Other pulmonary embolism without acute cor pulmonale: Secondary | ICD-10-CM | POA: Diagnosis not present

## 2023-02-07 DIAGNOSIS — I69351 Hemiplegia and hemiparesis following cerebral infarction affecting right dominant side: Secondary | ICD-10-CM | POA: Diagnosis not present

## 2023-02-07 DIAGNOSIS — I2510 Atherosclerotic heart disease of native coronary artery without angina pectoris: Secondary | ICD-10-CM | POA: Diagnosis not present

## 2023-02-07 DIAGNOSIS — Z8546 Personal history of malignant neoplasm of prostate: Secondary | ICD-10-CM | POA: Diagnosis not present

## 2023-02-07 DIAGNOSIS — M170 Bilateral primary osteoarthritis of knee: Secondary | ICD-10-CM | POA: Diagnosis not present

## 2023-02-07 DIAGNOSIS — Z7901 Long term (current) use of anticoagulants: Secondary | ICD-10-CM | POA: Diagnosis not present

## 2023-02-07 DIAGNOSIS — M5136 Other intervertebral disc degeneration, lumbar region: Secondary | ICD-10-CM | POA: Diagnosis not present

## 2023-02-13 ENCOUNTER — Telehealth: Payer: Self-pay | Admitting: Nurse Practitioner

## 2023-02-13 ENCOUNTER — Other Ambulatory Visit: Payer: Self-pay | Admitting: Internal Medicine

## 2023-02-13 DIAGNOSIS — K581 Irritable bowel syndrome with constipation: Secondary | ICD-10-CM | POA: Diagnosis not present

## 2023-02-13 DIAGNOSIS — G5602 Carpal tunnel syndrome, left upper limb: Secondary | ICD-10-CM | POA: Diagnosis not present

## 2023-02-13 DIAGNOSIS — E669 Obesity, unspecified: Secondary | ICD-10-CM | POA: Diagnosis not present

## 2023-02-13 DIAGNOSIS — G4733 Obstructive sleep apnea (adult) (pediatric): Secondary | ICD-10-CM | POA: Diagnosis not present

## 2023-02-13 DIAGNOSIS — M170 Bilateral primary osteoarthritis of knee: Secondary | ICD-10-CM | POA: Diagnosis not present

## 2023-02-13 DIAGNOSIS — I129 Hypertensive chronic kidney disease with stage 1 through stage 4 chronic kidney disease, or unspecified chronic kidney disease: Secondary | ICD-10-CM | POA: Diagnosis not present

## 2023-02-13 DIAGNOSIS — M25461 Effusion, right knee: Secondary | ICD-10-CM | POA: Diagnosis not present

## 2023-02-13 DIAGNOSIS — Z6830 Body mass index (BMI) 30.0-30.9, adult: Secondary | ICD-10-CM | POA: Diagnosis not present

## 2023-02-13 DIAGNOSIS — I739 Peripheral vascular disease, unspecified: Secondary | ICD-10-CM | POA: Diagnosis not present

## 2023-02-13 DIAGNOSIS — E871 Hypo-osmolality and hyponatremia: Secondary | ICD-10-CM | POA: Diagnosis not present

## 2023-02-13 DIAGNOSIS — N1832 Chronic kidney disease, stage 3b: Secondary | ICD-10-CM | POA: Diagnosis not present

## 2023-02-13 DIAGNOSIS — I69393 Ataxia following cerebral infarction: Secondary | ICD-10-CM | POA: Diagnosis not present

## 2023-02-13 DIAGNOSIS — I69351 Hemiplegia and hemiparesis following cerebral infarction affecting right dominant side: Secondary | ICD-10-CM | POA: Diagnosis not present

## 2023-02-13 DIAGNOSIS — I2699 Other pulmonary embolism without acute cor pulmonale: Secondary | ICD-10-CM

## 2023-02-13 DIAGNOSIS — E785 Hyperlipidemia, unspecified: Secondary | ICD-10-CM | POA: Diagnosis not present

## 2023-02-13 DIAGNOSIS — N400 Enlarged prostate without lower urinary tract symptoms: Secondary | ICD-10-CM | POA: Diagnosis not present

## 2023-02-13 DIAGNOSIS — I2510 Atherosclerotic heart disease of native coronary artery without angina pectoris: Secondary | ICD-10-CM | POA: Diagnosis not present

## 2023-02-13 NOTE — Telephone Encounter (Signed)
Left message for patient to call office for clarification on how he is taking his medication

## 2023-02-13 NOTE — Telephone Encounter (Signed)
Patient stated he is taking 1 in AM and 1 in pm. Per Raynelle Fanning it is ok for patient to take 2 in the AM and 1 in PM. Patient notified

## 2023-02-13 NOTE — Telephone Encounter (Signed)
Please advise 

## 2023-02-13 NOTE — Telephone Encounter (Signed)
Tommy With [facility] called and says pt has 1 to 2 plus edema   taking furosemide 20 mg 1 tid  Please advise patient

## 2023-02-14 DIAGNOSIS — R42 Dizziness and giddiness: Secondary | ICD-10-CM | POA: Diagnosis not present

## 2023-02-18 DIAGNOSIS — I69351 Hemiplegia and hemiparesis following cerebral infarction affecting right dominant side: Secondary | ICD-10-CM | POA: Diagnosis not present

## 2023-02-18 DIAGNOSIS — Z6830 Body mass index (BMI) 30.0-30.9, adult: Secondary | ICD-10-CM | POA: Diagnosis not present

## 2023-02-18 DIAGNOSIS — E871 Hypo-osmolality and hyponatremia: Secondary | ICD-10-CM | POA: Diagnosis not present

## 2023-02-18 DIAGNOSIS — E785 Hyperlipidemia, unspecified: Secondary | ICD-10-CM | POA: Diagnosis not present

## 2023-02-18 DIAGNOSIS — G5602 Carpal tunnel syndrome, left upper limb: Secondary | ICD-10-CM | POA: Diagnosis not present

## 2023-02-18 DIAGNOSIS — I2510 Atherosclerotic heart disease of native coronary artery without angina pectoris: Secondary | ICD-10-CM | POA: Diagnosis not present

## 2023-02-18 DIAGNOSIS — N400 Enlarged prostate without lower urinary tract symptoms: Secondary | ICD-10-CM | POA: Diagnosis not present

## 2023-02-18 DIAGNOSIS — I69393 Ataxia following cerebral infarction: Secondary | ICD-10-CM | POA: Diagnosis not present

## 2023-02-18 DIAGNOSIS — M25461 Effusion, right knee: Secondary | ICD-10-CM | POA: Diagnosis not present

## 2023-02-18 DIAGNOSIS — K581 Irritable bowel syndrome with constipation: Secondary | ICD-10-CM | POA: Diagnosis not present

## 2023-02-18 DIAGNOSIS — M170 Bilateral primary osteoarthritis of knee: Secondary | ICD-10-CM | POA: Diagnosis not present

## 2023-02-18 DIAGNOSIS — E669 Obesity, unspecified: Secondary | ICD-10-CM | POA: Diagnosis not present

## 2023-02-18 DIAGNOSIS — N1832 Chronic kidney disease, stage 3b: Secondary | ICD-10-CM | POA: Diagnosis not present

## 2023-02-18 DIAGNOSIS — I129 Hypertensive chronic kidney disease with stage 1 through stage 4 chronic kidney disease, or unspecified chronic kidney disease: Secondary | ICD-10-CM | POA: Diagnosis not present

## 2023-02-18 DIAGNOSIS — G4733 Obstructive sleep apnea (adult) (pediatric): Secondary | ICD-10-CM | POA: Diagnosis not present

## 2023-02-18 DIAGNOSIS — I739 Peripheral vascular disease, unspecified: Secondary | ICD-10-CM | POA: Diagnosis not present

## 2023-02-21 ENCOUNTER — Other Ambulatory Visit: Payer: Self-pay | Admitting: Nurse Practitioner

## 2023-02-21 DIAGNOSIS — E782 Mixed hyperlipidemia: Secondary | ICD-10-CM | POA: Diagnosis not present

## 2023-02-21 DIAGNOSIS — I2510 Atherosclerotic heart disease of native coronary artery without angina pectoris: Secondary | ICD-10-CM | POA: Diagnosis not present

## 2023-02-21 DIAGNOSIS — G4733 Obstructive sleep apnea (adult) (pediatric): Secondary | ICD-10-CM | POA: Diagnosis not present

## 2023-02-21 DIAGNOSIS — Z8673 Personal history of transient ischemic attack (TIA), and cerebral infarction without residual deficits: Secondary | ICD-10-CM | POA: Diagnosis not present

## 2023-02-21 DIAGNOSIS — R600 Localized edema: Secondary | ICD-10-CM

## 2023-02-21 NOTE — Telephone Encounter (Signed)
Requested Prescriptions  Pending Prescriptions Disp Refills   furosemide (LASIX) 20 MG tablet [Pharmacy Med Name: FUROSEMIDE 20MG  TABLETS] 180 tablet 0    Sig: TAKE 1 TABLET BY MOUTH EVERY MORNING AND 1 TABLET BY MOUTH EVERY AFTERNOON AS NEEDED FOR SWELLING IN THE LEGS     Cardiovascular:  Diuretics - Loop Failed - 02/21/2023  9:00 AM      Failed - Ca in normal range and within 180 days    Calcium  Date Value Ref Range Status  01/29/2023 8.1 (L) 8.9 - 10.3 mg/dL Final   Calcium, Total  Date Value Ref Range Status  08/25/2014 9.1 8.5 - 10.1 mg/dL Final         Failed - Cr in normal range and within 180 days    Creat  Date Value Ref Range Status  01/22/2023 1.25 0.70 - 1.28 mg/dL Final   Creatinine, Ser  Date Value Ref Range Status  01/29/2023 1.40 (H) 0.61 - 1.24 mg/dL Final   Creatinine, Urine  Date Value Ref Range Status  05/02/2021 128 20 - 320 mg/dL Final         Failed - Last BP in normal range    BP Readings from Last 1 Encounters:  01/29/23 (!) 190/62         Passed - K in normal range and within 180 days    Potassium  Date Value Ref Range Status  01/29/2023 3.9 3.5 - 5.1 mmol/L Final  08/25/2014 4.1 3.5 - 5.1 mmol/L Final         Passed - Na in normal range and within 180 days    Sodium  Date Value Ref Range Status  01/29/2023 137 135 - 145 mmol/L Final  10/19/2015 135 134 - 144 mmol/L Final  08/25/2014 133 (L) 136 - 145 mmol/L Final         Passed - Cl in normal range and within 180 days    Chloride  Date Value Ref Range Status  01/29/2023 103 98 - 111 mmol/L Final  08/25/2014 100 98 - 107 mmol/L Final         Passed - Mg Level in normal range and within 180 days    Magnesium  Date Value Ref Range Status  08/28/2022 2.2 1.7 - 2.4 mg/dL Final    Comment:    Performed at [facility], [address] [place], [place] [address]         Passed - Valid encounter within last 6 months    Recent Outpatient Visits           1 month ago  Primary hypertension   [facility] Berniece Salines, FNP   1 month ago Periumbilical abdominal pain   [facility] Mecum, Oswaldo Conroy, PA-C   2 months ago Hospital discharge follow-up   [facility] Berniece Salines, FNP   2 months ago Acute nonintractable headache, unspecified headache type   [facility] Berniece Salines, FNP   2 months ago Carotid artery stenosis, symptomatic, right   [facility] Berniece Salines, FNP       Future Appointments             In 5 days Allison Quarry, Ruby Cola, NP [facility] [place]   In 8 months Sondra Come, MD [facility]

## 2023-02-26 ENCOUNTER — Ambulatory Visit: Payer: Medicare Other | Admitting: Nurse Practitioner

## 2023-02-26 ENCOUNTER — Encounter: Payer: Self-pay | Admitting: Nurse Practitioner

## 2023-02-26 VITALS — BP 120/60 | HR 80 | Temp 98.1°F | Ht 72.0 in | Wt 227.2 lb

## 2023-02-26 DIAGNOSIS — I2699 Other pulmonary embolism without acute cor pulmonale: Secondary | ICD-10-CM | POA: Diagnosis not present

## 2023-02-26 DIAGNOSIS — G4733 Obstructive sleep apnea (adult) (pediatric): Secondary | ICD-10-CM | POA: Diagnosis not present

## 2023-02-26 DIAGNOSIS — I5032 Chronic diastolic (congestive) heart failure: Secondary | ICD-10-CM

## 2023-02-26 DIAGNOSIS — R0609 Other forms of dyspnea: Secondary | ICD-10-CM

## 2023-02-26 DIAGNOSIS — G4700 Insomnia, unspecified: Secondary | ICD-10-CM

## 2023-02-26 DIAGNOSIS — I63231 Cerebral infarction due to unspecified occlusion or stenosis of right carotid arteries: Secondary | ICD-10-CM

## 2023-02-26 DIAGNOSIS — I5030 Unspecified diastolic (congestive) heart failure: Secondary | ICD-10-CM | POA: Insufficient documentation

## 2023-02-26 NOTE — Assessment & Plan Note (Signed)
Severe OSA on CPAP. Decreased usage related to nocturia. Advised him to adjust timing of his second dose of lasix to avoid frequent urination at night and improve sleep quality. He will also trial melatonin to see if he receives benefit with sleep onset. He is still having some breakthrough events with residual AHI 7.3 and moderate leaks. CAI was 3.9. He recently received a new mask. We will tighten his settings to 5-11 cmH2O and reassess control at follow up. May need CPAP titration to evaluate need for BiPAP given recent stroke and emergence of central events. Understands risks of untreated OSA. Proper care/use of device reviewed. Aware of safe driving practices.   Patient Instructions  Increase CPAP usage to every night, minimum of 4-6 hours a night.  Change equipment every 30 days or as directed by DME. Wash your tubing with warm soap and water daily, hang to dry. Wash humidifier portion weekly. Use bottled, distilled water and change daily  Be aware of reduced alertness and do not drive or operate heavy machinery if experiencing this or drowsiness.  Exercise encouraged, as tolerated. Notify if persistent daytime sleepiness occurs even with consistent use of CPAP.  We discussed how untreated sleep apnea puts an individual at risk for cardiac arrhthymias, pulm HTN, DM, stroke and increases their risk for daytime accidents.  Adjust CPAP settings to 5-11 cmH2O  Try over the counter melatonin 1-5 mg At bedtime as needed for sleep  Adjust the timing of your second dose of lasix to around 3 pm. Hopefully this will help decrease the amount of times you have to get up at night.   Keep working with PT on your activity levels  Wear compression stockings during the day. Monitor your weights at home for 2-3 lb weight gain overnight or 5 lb in a week   Follow up in 6 weeks with Dr. Craige Cotta or Philis Nettle. If symptoms do not improve or worsen, please contact office for sooner follow up or seek emergency  care.

## 2023-02-26 NOTE — Assessment & Plan Note (Signed)
Acute unprovoked PE January 2024. Hematology is following. No excessive bruising or bleeding. Aware of bleeding precautions. Plan is for a minimum of 6-12 months of therapy on Eliquis per hematology's last note.

## 2023-02-26 NOTE — Assessment & Plan Note (Signed)
Multifactorial related to acute PE, diastolic dysfunction, volume overload and numerous hospital admissions and CVA resulting in deconditioning. Overall, slowly clinically improving. Encouraged him to continue working with PT. Recent adjustment of lasix. He will monitor weights at home. If no continued improvement, could repeat imaging of the lungs and obtain pulmonary function testing for further evaluation.

## 2023-02-26 NOTE — Progress Notes (Signed)
@Patient  ID: Jared Cline, male    DOB: June 23, 1946, 77 y.o.   MRN: [id number]  Chief Complaint  Patient presents with   Follow-up    Wearing cpap avg 6-7hr nightly- pressure and mask is okay.      Referring provider: Berniece Salines, FNP  HPI: 77 year old male, former remote smoker followed for OSA on CPAP. He is a patient of Dr. Evlyn Courier and last seen in office 05/16/2022. Past medical history significant for CVA, PVD, CAD, HTN, acute PE on Eliquis, CKD stage 3, HLD.   TEST/EVENTS:  07/17/2022 HST: AHI 33.7, SpO2 low 77% 12/06/2022 echo: EF 60-65%. Moderate LVH. GIDD. RV size and function nl. Nl PASP.   05/16/2022: OV with Dr. Craige Cotta. Previously on CPAP but hasn't used it in a few months due to broken machine. Machine is 69-17 years old. Having more trouble with his sleep without CPAP. Snoring, stops breathing at night. More sleepy during the day. HST ordered to reassess status of sleep apnea. Plan to arrange for a new CPAP following this.   02/26/2023: Today - follow up Patient presents today for follow up. Since he was seen last, he has had multiple hospital admissions for recurrent CVA with first being in January 2024 and most recent in April 2024. He was also diagnosed with acute PE in January 2024 and started on Eliquis. He has been having some shortness of breath with longer distances and uphill climbing since his hospitalization in January. He's also been dealing with increase leg swelling, which cardiology just adjusted his lasix for. He's been working with PT at home. He does feel like his breathing is getting better and stamina is improving. Denies any cough, wheezing, chest congestion, orthopnea, PND, palpitations, CP. Compliant with his Eliquis. No excessive bruising or bleeding. Following with hematology.  He sleeps with his CPAP most nights. He's been having a lot of trouble over the last month with his sleep due to having to urinate frequently. He's been taking his second dose of lasix  at 8 pm. He is up constantly so he's not been getting much sleep. He will put his CPAP back on and try to go back to sleep but some nights isn't able to. He does have trouble falling asleep as well, which has been an ongoing problem for him for many years. He does feel like he gets benefit from using his CPAP. Denies any morning headaches, drowsy driving, sleep parasomnias/paralysis. He changed his mask the other day. Leaks are not bothering him.  01/25/2023-02/23/2023:  CPAP 5-15 cmH2O 28/30 days; 60% >4 hr; average use 4 hr 24 min Pressure 95th 11.1 Leaks 95th 33.1 AHI 7.3   Allergies  Allergen Reactions   Flomax [Tamsulosin Hcl] Shortness Of Breath   Ace Inhibitors Other (See Comments)    Angioedema   Amlodipine Other (See Comments)    Angioedema   Doxazosin    Lipitor [Atorvastatin] Other (See Comments)    Memory loss    Immunization History  Administered Date(s) Administered   Fluad Quad(high Dose 65+) 05/25/2019, 07/04/2020, 06/18/2021   Influenza, High Dose Seasonal PF 06/09/2017, 06/11/2018   PFIZER(Purple Top)SARS-COV-2 Vaccination 10/23/2019, 11/13/2019, 06/16/2020   Pneumococcal Conjugate-13 06/09/2017   Pneumococcal Polysaccharide-23 05/21/2011   Td 08/19/2006    Past Medical History:  Diagnosis Date   BPH (benign prostatic hypertrophy)    Bradycardia    evaluated by Dr. Gwen Pounds   Cancer [facility])    prostate   Carpal tunnel syndrome, left  left arm   CKD (chronic kidney disease) stage 2, GFR 60-89 ml/min    Diverticulitis    Gastritis 2017   History of prostate cancer    HNP (herniated nucleus pulposus), lumbar    Hyperlipidemia    Hypertension    IBS (irritable bowel syndrome)    Impotence    Multiple lung nodules    nonspecific, largest 5mm in Jan 2016; scanned March 2017; rescan 3-6 months later   OA (osteoarthritis) of knee    right   Renal mass    evaluated; followed by urologist   Sleep apnea    on CPAP   Stroke (HCC)    Torn meniscus    x 2;  right knee    Tobacco History: Social History   Tobacco Use  Smoking Status Former   Packs/day: 0.50   Years: 9.00   Additional pack years: 0.00   Total pack years: 4.50   Types: Cigarettes   Quit date: 08/20/1971   Years since quitting: 51.5   Passive exposure: Past  Smokeless Tobacco Never   Counseling given: Not Answered   Outpatient Medications Prior to Visit  Medication Sig Dispense Refill   acetaminophen (TYLENOL) 650 MG CR tablet Take 2 tablets (1,300 mg total) by mouth every 12 (twelve) hours as needed (Mild pain).     aspirin EC 81 MG tablet Take 1 tablet (81 mg total) by mouth daily at 6 (six) AM. Swallow whole. 30 tablet 11   cholecalciferol (VITAMIN D3) 25 MCG (1000 UNIT) tablet Take 1,000 Units by mouth daily.     CINNAMON PO Take by mouth.     cloNIDine (CATAPRES) 0.2 MG tablet Take 1 tablet (0.2 mg total) by mouth 2 (two) times daily. 180 tablet 1   clopidogrel (PLAVIX) 75 MG tablet Take 1 tablet (75 mg total) by mouth daily. 30 tablet 11   cyanocobalamin (VITAMIN B12) 1000 MCG tablet Take 1,000 mcg by mouth daily.     diclofenac Sodium (VOLTAREN) 1 % GEL Apply 2 g topically 4 (four) times daily. 400 g 0   ELIQUIS 5 MG TABS tablet TAKE 1 TABLET(5 MG) BY MOUTH TWICE DAILY 60 tablet 3   furosemide (LASIX) 20 MG tablet TAKE 1 TABLET BY MOUTH EVERY MORNING AND 1 TABLET BY MOUTH EVERY AFTERNOON AS NEEDED FOR SWELLING IN THE LEGS 180 tablet 0   GARLIC PO Take by mouth.     hydrALAZINE (APRESOLINE) 10 MG tablet TAKE 1 TABLET BY MOUTH TWICE DAILY AS NEEDED; TAKE IF BLOOD PRESSURE GREATER THAN 140/ 90 90 tablet 1   labetalol (NORMODYNE) 100 MG tablet 100 mg 2 (two) times daily.     pravastatin (PRAVACHOL) 80 MG tablet Take 1 tablet (80 mg total) by mouth at bedtime. 90 tablet 1   senna-docusate (SENOKOT-S) 8.6-50 MG tablet Take 3 tablets by mouth daily at 6 (six) AM. 90 tablet 0   valsartan (DIOVAN) 160 MG tablet Take 1 tablet (160 mg total) by mouth daily.     No  facility-administered medications prior to visit.     Review of Systems:   Constitutional: No weight loss or gain, night sweats, fevers, chills, or lassitude. +fatigue  HEENT: No headaches, difficulty swallowing, tooth/dental problems, or sore throat. No sneezing, itching, ear ache, nasal congestion, or post nasal drip CV:  +swelling in lower extremities. No chest pain, orthopnea, PND, anasarca, dizziness, palpitations, syncope Resp: +shortness of breath with exertion (improving). No excess mucus or change in color of mucus. No productive or  non-productive. No hemoptysis. No wheezing.  No chest wall deformity GI:  No heartburn, indigestion, abdominal pain, nausea, vomiting, diarrhea, change in bowel habits, loss of appetite, bloody stools.  GU: No dysuria, change in color of urine, urgency. +nocturia Skin: No rash, lesions, ulcerations MSK:  No joint pain or swelling.   Neuro: No dizziness or lightheadedness.  Psych: No depression or anxiety. Mood stable.     Physical Exam:  BP 120/60 (BP Location: Left Arm, Cuff Size: Normal)   Pulse 80   Temp 98.1 F (36.7 C) (Temporal)   Ht 6' (1.829 m)   Wt 227 lb 3.2 oz (103.1 kg)   SpO2 100%   BMI 30.81 kg/m   GEN: Pleasant, interactive, well-kempt; obese; in no acute distress. HEENT:  Normocephalic and atraumatic. PERRLA. Sclera white. Nasal turbinates pink, moist and patent bilaterally. No rhinorrhea present. Oropharynx pink and moist, without exudate or edema. No lesions, ulcerations, or postnasal drip.  NECK:  Supple w/ fair ROM. No JVD present. Normal carotid impulses w/o bruits. Thyroid symmetrical with no goiter or nodules palpated. No lymphadenopathy.   CV: RRR, no m/r/g, +2 BLE pitting edema. Pulses intact, +2 bilaterally. No cyanosis, pallor or clubbing. PULMONARY:  Unlabored, regular breathing. Clear bilaterally A&P w/o wheezes/rales/rhonchi. No accessory muscle use.  GI: BS present and normoactive. Soft, non-tender to palpation.  No organomegaly or masses detected.  MSK: No erythema, warmth or tenderness. Cap refil <2 sec all extrem. No deformities or joint swelling noted.  Neuro: A/Ox3. No focal deficits noted.   Skin: Warm, no lesions or rashe Psych: Normal affect and behavior. Judgement and thought content appropriate.     Lab Results:  CBC    Component Value Date/Time   WBC 7.7 01/29/2023 1016   RBC 4.01 (L) 01/29/2023 1016   HGB 11.2 (L) 01/29/2023 1016   HGB 12.3 (L) 10/19/2015 1048   HCT 35.0 (L) 01/29/2023 1016   HCT 37.6 10/19/2015 1048   PLT 316 01/29/2023 1016   PLT 343 10/19/2015 1048   MCV 87.3 01/29/2023 1016   MCV 83 10/19/2015 1048   MCV 85 08/25/2014 1020   MCH 27.9 01/29/2023 1016   MCHC 32.0 01/29/2023 1016   RDW 12.2 01/29/2023 1016   RDW 13.1 10/19/2015 1048   RDW 13.1 08/25/2014 1020   LYMPHSABS 1.8 01/29/2023 1016   LYMPHSABS 2.3 10/19/2015 1048   LYMPHSABS 2.2 08/25/2014 1020   MONOABS 0.4 01/29/2023 1016   MONOABS 0.5 08/25/2014 1020   EOSABS 0.1 01/29/2023 1016   EOSABS 0.1 10/19/2015 1048   EOSABS 0.1 08/25/2014 1020   BASOSABS 0.0 01/29/2023 1016   BASOSABS 0.1 10/19/2015 1048   BASOSABS 0.1 08/25/2014 1020    BMET    Component Value Date/Time   NA 137 01/29/2023 1016   NA 135 10/19/2015 1048   NA 133 (L) 08/25/2014 1020   K 3.9 01/29/2023 1016   K 4.1 08/25/2014 1020   CL 103 01/29/2023 1016   CL 100 08/25/2014 1020   CO2 24 01/29/2023 1016   CO2 27 08/25/2014 1020   GLUCOSE 131 (H) 01/29/2023 1016   GLUCOSE 95 08/25/2014 1020   BUN 22 01/29/2023 1016   BUN 12 10/19/2015 1048   BUN 13 08/25/2014 1020   CREATININE 1.40 (H) 01/29/2023 1016   CREATININE 1.25 01/22/2023 1114   CALCIUM 8.1 (L) 01/29/2023 1016   CALCIUM 9.1 08/25/2014 1020   GFRNONAA 51 (L) 01/29/2023 1016   GFRNONAA 49 (L) 07/21/2020 0930   GFRAA 57 (  L) 07/21/2020 0930    BNP    Component Value Date/Time   BNP 65 01/22/2023 1114     Imaging:  No results found.        No  data to display          No results found for: "NITRICOXIDE"      Assessment & Plan:   OSA on CPAP Severe OSA on CPAP. Decreased usage related to nocturia. Advised him to adjust timing of his second dose of lasix to avoid frequent urination at night and improve sleep quality. He will also trial melatonin to see if he receives benefit with sleep onset. He is still having some breakthrough events with residual AHI 7.3 and moderate leaks. CAI was 3.9. He recently received a new mask. We will tighten his settings to 5-11 cmH2O and reassess control at follow up. May need CPAP titration to evaluate need for BiPAP given recent stroke and emergence of central events. Understands risks of untreated OSA. Proper care/use of device reviewed. Aware of safe driving practices.   Patient Instructions  Increase CPAP usage to every night, minimum of 4-6 hours a night.  Change equipment every 30 days or as directed by DME. Wash your tubing with warm soap and water daily, hang to dry. Wash humidifier portion weekly. Use bottled, distilled water and change daily  Be aware of reduced alertness and do not drive or operate heavy machinery if experiencing this or drowsiness.  Exercise encouraged, as tolerated. Notify if persistent daytime sleepiness occurs even with consistent use of CPAP.  We discussed how untreated sleep apnea puts an individual at risk for cardiac arrhthymias, pulm HTN, DM, stroke and increases their risk for daytime accidents.  Adjust CPAP settings to 5-11 cmH2O  Try over the counter melatonin 1-5 mg At bedtime as needed for sleep  Adjust the timing of your second dose of lasix to around 3 pm. Hopefully this will help decrease the amount of times you have to get up at night.   Keep working with PT on your activity levels  Wear compression stockings during the day. Monitor your weights at home for 2-3 lb weight gain overnight or 5 lb in a week   Follow up in 6 weeks with Dr. Craige Cotta or  Philis Nettle. If symptoms do not improve or worsen, please contact office for sooner follow up or seek emergency care.    DOE (dyspnea on exertion) Multifactorial related to acute PE, diastolic dysfunction, volume overload and numerous hospital admissions and CVA resulting in deconditioning. Overall, slowly clinically improving. Encouraged him to continue working with PT. Recent adjustment of lasix. He will monitor weights at home. If no continued improvement, could repeat imaging of the lungs and obtain pulmonary function testing for further evaluation.   Diastolic CHF (HCC) BLE edema. Recent dose adjustment of lasix. Weight is stable. Advised to utilize compression stockings. Follow up with cardiology as scheduled.   Acute pulmonary embolism without acute cor pulmonale (HCC) Acute unprovoked PE January 2024. Hematology is following. No excessive bruising or bleeding. Aware of bleeding precautions. Plan is for a minimum of 6-12 months of therapy on Eliquis per hematology's last note.   CVA (cerebral vascular accident) (HCC) Clinically improving. Follow up with neurology as scheduled  Insomnia Multifactorial. Add on melatonin for sleep onset. If no improvement, can consider additional pharmacological therapies. Sleep hygiene reviewed.    I spent 42 minutes of dedicated to the care of this patient on the date of this encounter to  include pre-visit review of records, face-to-face time with the patient discussing conditions above, post visit ordering of testing, clinical documentation with the electronic health record, making appropriate referrals as documented, and communicating necessary findings to members of the patients care team.  Noemi Chapel, NP 02/26/2023  Pt aware and understands NP's role.

## 2023-02-26 NOTE — Assessment & Plan Note (Signed)
Multifactorial. Add on melatonin for sleep onset. If no improvement, can consider additional pharmacological therapies. Sleep hygiene reviewed.

## 2023-02-26 NOTE — Assessment & Plan Note (Signed)
Clinically improving. Follow up with neurology as scheduled

## 2023-02-26 NOTE — Patient Instructions (Addendum)
Increase CPAP usage to every night, minimum of 4-6 hours a night.  Change equipment every 30 days or as directed by DME. Wash your tubing with warm soap and water daily, hang to dry. Wash humidifier portion weekly. Use bottled, distilled water and change daily  Be aware of reduced alertness and do not drive or operate heavy machinery if experiencing this or drowsiness.  Exercise encouraged, as tolerated. Notify if persistent daytime sleepiness occurs even with consistent use of CPAP.  We discussed how untreated sleep apnea puts an individual at risk for cardiac arrhthymias, pulm HTN, DM, stroke and increases their risk for daytime accidents.  Adjust CPAP settings to 5-11 cmH2O  Try over the counter melatonin 1-5 mg At bedtime as needed for sleep  Adjust the timing of your second dose of lasix to around 3 pm. Hopefully this will help decrease the amount of times you have to get up at night.   Keep working with PT on your activity levels  Wear compression stockings during the day. Monitor your weights at home for 2-3 lb weight gain overnight or 5 lb in a week   Follow up in 6 weeks with Dr. Craige Cotta or Philis Nettle. If symptoms do not improve or worsen, please contact office for sooner follow up or seek emergency care.

## 2023-02-26 NOTE — Assessment & Plan Note (Signed)
BLE edema. Recent dose adjustment of lasix. Weight is stable. Advised to utilize compression stockings. Follow up with cardiology as scheduled.

## 2023-02-27 DIAGNOSIS — N1832 Chronic kidney disease, stage 3b: Secondary | ICD-10-CM | POA: Diagnosis not present

## 2023-02-27 DIAGNOSIS — M25461 Effusion, right knee: Secondary | ICD-10-CM | POA: Diagnosis not present

## 2023-02-27 DIAGNOSIS — I129 Hypertensive chronic kidney disease with stage 1 through stage 4 chronic kidney disease, or unspecified chronic kidney disease: Secondary | ICD-10-CM | POA: Diagnosis not present

## 2023-02-27 DIAGNOSIS — N400 Enlarged prostate without lower urinary tract symptoms: Secondary | ICD-10-CM | POA: Diagnosis not present

## 2023-02-27 DIAGNOSIS — I739 Peripheral vascular disease, unspecified: Secondary | ICD-10-CM | POA: Diagnosis not present

## 2023-02-27 DIAGNOSIS — I69393 Ataxia following cerebral infarction: Secondary | ICD-10-CM | POA: Diagnosis not present

## 2023-02-27 DIAGNOSIS — Z6830 Body mass index (BMI) 30.0-30.9, adult: Secondary | ICD-10-CM | POA: Diagnosis not present

## 2023-02-27 DIAGNOSIS — I69351 Hemiplegia and hemiparesis following cerebral infarction affecting right dominant side: Secondary | ICD-10-CM | POA: Diagnosis not present

## 2023-02-27 DIAGNOSIS — G4733 Obstructive sleep apnea (adult) (pediatric): Secondary | ICD-10-CM | POA: Diagnosis not present

## 2023-02-27 DIAGNOSIS — E871 Hypo-osmolality and hyponatremia: Secondary | ICD-10-CM | POA: Diagnosis not present

## 2023-02-27 DIAGNOSIS — G5602 Carpal tunnel syndrome, left upper limb: Secondary | ICD-10-CM | POA: Diagnosis not present

## 2023-02-27 DIAGNOSIS — E785 Hyperlipidemia, unspecified: Secondary | ICD-10-CM | POA: Diagnosis not present

## 2023-02-27 DIAGNOSIS — I2510 Atherosclerotic heart disease of native coronary artery without angina pectoris: Secondary | ICD-10-CM | POA: Diagnosis not present

## 2023-02-27 DIAGNOSIS — E669 Obesity, unspecified: Secondary | ICD-10-CM | POA: Diagnosis not present

## 2023-02-27 DIAGNOSIS — K581 Irritable bowel syndrome with constipation: Secondary | ICD-10-CM | POA: Diagnosis not present

## 2023-02-27 DIAGNOSIS — M170 Bilateral primary osteoarthritis of knee: Secondary | ICD-10-CM | POA: Diagnosis not present

## 2023-02-28 NOTE — Progress Notes (Signed)
Reviewed and agree with assessment/plan.   Coralyn Helling, MD [facility]/Critical [facility] 02/28/2023, 8:54 AM Pager:  [phone]

## 2023-03-06 DIAGNOSIS — K581 Irritable bowel syndrome with constipation: Secondary | ICD-10-CM | POA: Diagnosis not present

## 2023-03-06 DIAGNOSIS — M170 Bilateral primary osteoarthritis of knee: Secondary | ICD-10-CM | POA: Diagnosis not present

## 2023-03-06 DIAGNOSIS — M25461 Effusion, right knee: Secondary | ICD-10-CM | POA: Diagnosis not present

## 2023-03-06 DIAGNOSIS — E669 Obesity, unspecified: Secondary | ICD-10-CM | POA: Diagnosis not present

## 2023-03-06 DIAGNOSIS — I69393 Ataxia following cerebral infarction: Secondary | ICD-10-CM | POA: Diagnosis not present

## 2023-03-06 DIAGNOSIS — N400 Enlarged prostate without lower urinary tract symptoms: Secondary | ICD-10-CM | POA: Diagnosis not present

## 2023-03-06 DIAGNOSIS — E785 Hyperlipidemia, unspecified: Secondary | ICD-10-CM | POA: Diagnosis not present

## 2023-03-06 DIAGNOSIS — I129 Hypertensive chronic kidney disease with stage 1 through stage 4 chronic kidney disease, or unspecified chronic kidney disease: Secondary | ICD-10-CM | POA: Diagnosis not present

## 2023-03-06 DIAGNOSIS — I739 Peripheral vascular disease, unspecified: Secondary | ICD-10-CM | POA: Diagnosis not present

## 2023-03-06 DIAGNOSIS — E871 Hypo-osmolality and hyponatremia: Secondary | ICD-10-CM | POA: Diagnosis not present

## 2023-03-06 DIAGNOSIS — Z6830 Body mass index (BMI) 30.0-30.9, adult: Secondary | ICD-10-CM | POA: Diagnosis not present

## 2023-03-06 DIAGNOSIS — I69351 Hemiplegia and hemiparesis following cerebral infarction affecting right dominant side: Secondary | ICD-10-CM | POA: Diagnosis not present

## 2023-03-06 DIAGNOSIS — N1832 Chronic kidney disease, stage 3b: Secondary | ICD-10-CM | POA: Diagnosis not present

## 2023-03-06 DIAGNOSIS — I2510 Atherosclerotic heart disease of native coronary artery without angina pectoris: Secondary | ICD-10-CM | POA: Diagnosis not present

## 2023-03-06 DIAGNOSIS — G5602 Carpal tunnel syndrome, left upper limb: Secondary | ICD-10-CM | POA: Diagnosis not present

## 2023-03-06 DIAGNOSIS — G4733 Obstructive sleep apnea (adult) (pediatric): Secondary | ICD-10-CM | POA: Diagnosis not present

## 2023-03-08 DIAGNOSIS — G4733 Obstructive sleep apnea (adult) (pediatric): Secondary | ICD-10-CM | POA: Diagnosis not present

## 2023-03-09 DIAGNOSIS — Z7982 Long term (current) use of aspirin: Secondary | ICD-10-CM | POA: Diagnosis not present

## 2023-03-09 DIAGNOSIS — I739 Peripheral vascular disease, unspecified: Secondary | ICD-10-CM | POA: Diagnosis not present

## 2023-03-09 DIAGNOSIS — Z7901 Long term (current) use of anticoagulants: Secondary | ICD-10-CM | POA: Diagnosis not present

## 2023-03-09 DIAGNOSIS — I129 Hypertensive chronic kidney disease with stage 1 through stage 4 chronic kidney disease, or unspecified chronic kidney disease: Secondary | ICD-10-CM | POA: Diagnosis not present

## 2023-03-09 DIAGNOSIS — Z7902 Long term (current) use of antithrombotics/antiplatelets: Secondary | ICD-10-CM | POA: Diagnosis not present

## 2023-03-09 DIAGNOSIS — Z8546 Personal history of malignant neoplasm of prostate: Secondary | ICD-10-CM | POA: Diagnosis not present

## 2023-03-09 DIAGNOSIS — Z6830 Body mass index (BMI) 30.0-30.9, adult: Secondary | ICD-10-CM | POA: Diagnosis not present

## 2023-03-09 DIAGNOSIS — K581 Irritable bowel syndrome with constipation: Secondary | ICD-10-CM | POA: Diagnosis not present

## 2023-03-09 DIAGNOSIS — I69393 Ataxia following cerebral infarction: Secondary | ICD-10-CM | POA: Diagnosis not present

## 2023-03-09 DIAGNOSIS — G4733 Obstructive sleep apnea (adult) (pediatric): Secondary | ICD-10-CM | POA: Diagnosis not present

## 2023-03-09 DIAGNOSIS — N400 Enlarged prostate without lower urinary tract symptoms: Secondary | ICD-10-CM | POA: Diagnosis not present

## 2023-03-09 DIAGNOSIS — E871 Hypo-osmolality and hyponatremia: Secondary | ICD-10-CM | POA: Diagnosis not present

## 2023-03-09 DIAGNOSIS — E785 Hyperlipidemia, unspecified: Secondary | ICD-10-CM | POA: Diagnosis not present

## 2023-03-09 DIAGNOSIS — I69351 Hemiplegia and hemiparesis following cerebral infarction affecting right dominant side: Secondary | ICD-10-CM | POA: Diagnosis not present

## 2023-03-09 DIAGNOSIS — G5602 Carpal tunnel syndrome, left upper limb: Secondary | ICD-10-CM | POA: Diagnosis not present

## 2023-03-09 DIAGNOSIS — M170 Bilateral primary osteoarthritis of knee: Secondary | ICD-10-CM | POA: Diagnosis not present

## 2023-03-09 DIAGNOSIS — M5136 Other intervertebral disc degeneration, lumbar region: Secondary | ICD-10-CM | POA: Diagnosis not present

## 2023-03-09 DIAGNOSIS — I2510 Atherosclerotic heart disease of native coronary artery without angina pectoris: Secondary | ICD-10-CM | POA: Diagnosis not present

## 2023-03-09 DIAGNOSIS — E669 Obesity, unspecified: Secondary | ICD-10-CM | POA: Diagnosis not present

## 2023-03-09 DIAGNOSIS — N1832 Chronic kidney disease, stage 3b: Secondary | ICD-10-CM | POA: Diagnosis not present

## 2023-03-09 DIAGNOSIS — M25461 Effusion, right knee: Secondary | ICD-10-CM | POA: Diagnosis not present

## 2023-03-13 DIAGNOSIS — N400 Enlarged prostate without lower urinary tract symptoms: Secondary | ICD-10-CM | POA: Diagnosis not present

## 2023-03-13 DIAGNOSIS — I739 Peripheral vascular disease, unspecified: Secondary | ICD-10-CM | POA: Diagnosis not present

## 2023-03-13 DIAGNOSIS — I69351 Hemiplegia and hemiparesis following cerebral infarction affecting right dominant side: Secondary | ICD-10-CM | POA: Diagnosis not present

## 2023-03-13 DIAGNOSIS — G5602 Carpal tunnel syndrome, left upper limb: Secondary | ICD-10-CM | POA: Diagnosis not present

## 2023-03-13 DIAGNOSIS — E785 Hyperlipidemia, unspecified: Secondary | ICD-10-CM | POA: Diagnosis not present

## 2023-03-13 DIAGNOSIS — I69393 Ataxia following cerebral infarction: Secondary | ICD-10-CM | POA: Diagnosis not present

## 2023-03-13 DIAGNOSIS — I129 Hypertensive chronic kidney disease with stage 1 through stage 4 chronic kidney disease, or unspecified chronic kidney disease: Secondary | ICD-10-CM | POA: Diagnosis not present

## 2023-03-13 DIAGNOSIS — E669 Obesity, unspecified: Secondary | ICD-10-CM | POA: Diagnosis not present

## 2023-03-13 DIAGNOSIS — N1832 Chronic kidney disease, stage 3b: Secondary | ICD-10-CM | POA: Diagnosis not present

## 2023-03-13 DIAGNOSIS — Z6830 Body mass index (BMI) 30.0-30.9, adult: Secondary | ICD-10-CM | POA: Diagnosis not present

## 2023-03-13 DIAGNOSIS — I2510 Atherosclerotic heart disease of native coronary artery without angina pectoris: Secondary | ICD-10-CM | POA: Diagnosis not present

## 2023-03-13 DIAGNOSIS — M170 Bilateral primary osteoarthritis of knee: Secondary | ICD-10-CM | POA: Diagnosis not present

## 2023-03-13 DIAGNOSIS — G4733 Obstructive sleep apnea (adult) (pediatric): Secondary | ICD-10-CM | POA: Diagnosis not present

## 2023-03-13 DIAGNOSIS — K581 Irritable bowel syndrome with constipation: Secondary | ICD-10-CM | POA: Diagnosis not present

## 2023-03-13 DIAGNOSIS — E871 Hypo-osmolality and hyponatremia: Secondary | ICD-10-CM | POA: Diagnosis not present

## 2023-03-13 DIAGNOSIS — M25461 Effusion, right knee: Secondary | ICD-10-CM | POA: Diagnosis not present

## 2023-03-20 NOTE — Progress Notes (Signed)
Acute Office Visit  Subjective:     Patient ID: Jared Cline, male    DOB: 1946-08-04, 77 y.o.   MRN: [id number]  Chief Complaint  Patient presents with   Leg Swelling    bilateral    HPI Patient is in today for swelling in hands and feet.  He is new to me. He does have heart failure, most recent echo from April 2024 showing EF of 55 to 60%.  He does normally have swelling in his legs but has noted it worsening over the last week.  His activity levels has not generally changed.  He is on a heart healthy diet with low sodium.  He tries to keep his fluids below 3 to 4 L a day.  He is using compression stockings and keeping his legs elevated.  He is currently taking Lasix 40 mg in the morning and 20 in the evening.  He is urinating frequently but has not noticed any real change in the swelling.  He does not monitor his weights daily but 2 days ago he was 227, 229 today.  Denies shortness of breath, chest pain, palpitations.  He does have CKD stage IIIA, last creatinine in June 1.40.    Review of Systems  Constitutional:  Negative for chills and fever.  Respiratory:  Negative for shortness of breath.   Cardiovascular:  Positive for leg swelling. Negative for chest pain and palpitations.        Objective:    BP 136/64   Pulse 81   Temp 98.2 F (36.8 C)   Resp 18   Ht 6' (1.829 m)   Wt 229 lb (103.9 kg)   SpO2 99%   BMI 31.06 kg/m  BP Readings from Last 3 Encounters:  03/21/23 136/64  02/26/23 120/60  01/29/23 (!) 190/62   Wt Readings from Last 3 Encounters:  03/21/23 229 lb (103.9 kg)  02/26/23 227 lb 3.2 oz (103.1 kg)  01/29/23 224 lb 6.4 oz (101.8 kg)      Physical Exam Constitutional:      Appearance: Normal appearance.  HENT:     Head: Normocephalic and atraumatic.  Eyes:     Conjunctiva/sclera: Conjunctivae normal.  Cardiovascular:     Rate and Rhythm: Normal rate and regular rhythm.  Pulmonary:     Effort: Pulmonary effort is normal.     Breath sounds:  Normal breath sounds.  Musculoskeletal:     Right lower leg: Edema present.     Left lower leg: Edema present.     Comments: 3+ BLE pitting edema up to mid calf  Skin:    General: Skin is warm and dry.  Neurological:     General: No focal deficit present.     Mental Status: He is alert. Mental status is at baseline.  Psychiatric:        Mood and Affect: Mood normal.        Behavior: Behavior normal.     No results found for any visits on 03/21/23.      Assessment & Plan:   1. Lower extremity edema: Will switch from Lasix to torsemide to see if he has better results with that.  Will start furosemide 20 mg twice daily, patient will continue to elevate his legs, use compression stockings.  Discussed keeping his sodium less than 2 g a day and fluid less than 2 L a day.  Patient will monitor his weights daily, write these down and bring them to our office visit in  1 week to recheck legs and kidney function.  - torsemide (DEMADEX) 20 MG tablet; Take 1 tablet (20 mg total) by mouth 2 (two) times daily.  Dispense: 20 tablet; Refill: 0   Return in about 1 week (around 03/28/2023) for me or Raynelle Fanning .  Margarita Mail, DO

## 2023-03-21 ENCOUNTER — Encounter: Payer: Self-pay | Admitting: Internal Medicine

## 2023-03-21 ENCOUNTER — Ambulatory Visit (INDEPENDENT_AMBULATORY_CARE_PROVIDER_SITE_OTHER): Payer: Medicare Other | Admitting: Internal Medicine

## 2023-03-21 VITALS — BP 136/64 | HR 81 | Temp 98.2°F | Resp 18 | Ht 72.0 in | Wt 229.0 lb

## 2023-03-21 DIAGNOSIS — R600 Localized edema: Secondary | ICD-10-CM

## 2023-03-21 MED ORDER — TORSEMIDE 20 MG PO TABS
20.0000 mg | ORAL_TABLET | Freq: Two times a day (BID) | ORAL | 0 refills | Status: DC
Start: 2023-03-21 — End: 2023-04-01

## 2023-03-21 NOTE — Patient Instructions (Addendum)
It was great seeing you today!  Plan discussed at today's visit: -Switch Lasix to Torsemide 20 mg twice daily -Continue to use compression wraps and elevate legs -Decrease fluid intake to 2 L daily and keep sodium less than 2 g daily -Weight daily and bring to follow up  Follow up in: 1 week   Take care and let us know if you have any questions or concerns prior to your next visit.  Dr. Caralee Ates

## 2023-03-28 ENCOUNTER — Ambulatory Visit: Payer: Medicare Other | Admitting: Nurse Practitioner

## 2023-03-28 DIAGNOSIS — N1832 Chronic kidney disease, stage 3b: Secondary | ICD-10-CM | POA: Diagnosis not present

## 2023-03-28 DIAGNOSIS — Z6830 Body mass index (BMI) 30.0-30.9, adult: Secondary | ICD-10-CM | POA: Diagnosis not present

## 2023-03-28 DIAGNOSIS — G4733 Obstructive sleep apnea (adult) (pediatric): Secondary | ICD-10-CM | POA: Diagnosis not present

## 2023-03-28 DIAGNOSIS — I2510 Atherosclerotic heart disease of native coronary artery without angina pectoris: Secondary | ICD-10-CM | POA: Diagnosis not present

## 2023-03-28 DIAGNOSIS — K581 Irritable bowel syndrome with constipation: Secondary | ICD-10-CM | POA: Diagnosis not present

## 2023-03-28 DIAGNOSIS — I739 Peripheral vascular disease, unspecified: Secondary | ICD-10-CM | POA: Diagnosis not present

## 2023-03-28 DIAGNOSIS — G5602 Carpal tunnel syndrome, left upper limb: Secondary | ICD-10-CM | POA: Diagnosis not present

## 2023-03-28 DIAGNOSIS — E669 Obesity, unspecified: Secondary | ICD-10-CM | POA: Diagnosis not present

## 2023-03-28 DIAGNOSIS — E785 Hyperlipidemia, unspecified: Secondary | ICD-10-CM | POA: Diagnosis not present

## 2023-03-28 DIAGNOSIS — I129 Hypertensive chronic kidney disease with stage 1 through stage 4 chronic kidney disease, or unspecified chronic kidney disease: Secondary | ICD-10-CM | POA: Diagnosis not present

## 2023-03-28 DIAGNOSIS — I69393 Ataxia following cerebral infarction: Secondary | ICD-10-CM | POA: Diagnosis not present

## 2023-03-28 DIAGNOSIS — I69351 Hemiplegia and hemiparesis following cerebral infarction affecting right dominant side: Secondary | ICD-10-CM | POA: Diagnosis not present

## 2023-03-28 DIAGNOSIS — I10 Essential (primary) hypertension: Secondary | ICD-10-CM | POA: Diagnosis not present

## 2023-03-28 DIAGNOSIS — E871 Hypo-osmolality and hyponatremia: Secondary | ICD-10-CM | POA: Diagnosis not present

## 2023-03-28 DIAGNOSIS — M25461 Effusion, right knee: Secondary | ICD-10-CM | POA: Diagnosis not present

## 2023-03-28 DIAGNOSIS — M170 Bilateral primary osteoarthritis of knee: Secondary | ICD-10-CM | POA: Diagnosis not present

## 2023-03-28 DIAGNOSIS — N400 Enlarged prostate without lower urinary tract symptoms: Secondary | ICD-10-CM | POA: Diagnosis not present

## 2023-03-31 ENCOUNTER — Other Ambulatory Visit: Payer: Self-pay | Admitting: Nurse Practitioner

## 2023-03-31 ENCOUNTER — Telehealth: Payer: Self-pay | Admitting: Emergency Medicine

## 2023-03-31 ENCOUNTER — Other Ambulatory Visit: Payer: Self-pay

## 2023-03-31 ENCOUNTER — Ambulatory Visit (INDEPENDENT_AMBULATORY_CARE_PROVIDER_SITE_OTHER): Payer: Medicare Other | Admitting: Nurse Practitioner

## 2023-03-31 ENCOUNTER — Encounter: Payer: Self-pay | Admitting: Nurse Practitioner

## 2023-03-31 VITALS — BP 138/76 | HR 94 | Temp 98.1°F | Resp 16 | Ht 72.0 in | Wt 231.2 lb

## 2023-03-31 DIAGNOSIS — I5032 Chronic diastolic (congestive) heart failure: Secondary | ICD-10-CM | POA: Diagnosis not present

## 2023-03-31 DIAGNOSIS — R42 Dizziness and giddiness: Secondary | ICD-10-CM

## 2023-03-31 DIAGNOSIS — R600 Localized edema: Secondary | ICD-10-CM

## 2023-03-31 NOTE — Progress Notes (Signed)
BP 138/76   Pulse 94   Temp 98.1 F (36.7 C) (Oral)   Resp 16   Ht 6' (1.829 m)   Wt 231 lb 3.2 oz (104.9 kg)   SpO2 98%   BMI 31.36 kg/m    Subjective:    Patient ID: Jared Cline, male    DOB: 13-Aug-1946, 77 y.o.   MRN: [id number]  HPI: Jared Cline is a 77 y.o. male  Chief Complaint  Patient presents with   Edema    1 week recheck   Lower extremity edema/CHF diastolic follow up: patient was seen by Dr. Caralee Ates on 03/21/2023. She changes his diuretic from furosemide to torsemide 20 mg BID.   He does have heart failure, most recent echo from April 2024 showing EF of 55 to 60%. Patient was keep his sodium less than 2 g a day and fluid less than 2 L a day.  Patient will monitor his weights daily, write these down and bring them to our office visit in 1 week to recheck legs and kidney function. Last BNP was 65 on  01/22/2023. Last GFR was 51 on 01/29/2023. He last saw cardiology on 02/07/2023. Patient reports that since switching to torsemide his swelling has improved.  He reports some mornings he wakes up and his legs are puffy.  He denies any shortness of breath.  Well recheck kidney function.  Upon exam he still has some lower extremity edema but improved from previous visits.   Weight  03/21/2023 229 lbs 03/31/2023 231 lbs  Relevant past medical, surgical, family and social history reviewed and updated as indicated. Interim medical history since our last visit reviewed. Allergies and medications reviewed and updated.  Review of Systems  Constitutional: Negative for fever or weight change.  Respiratory: Negative for cough and shortness of breath.   Cardiovascular: Negative for chest pain or palpitations. Positive for lower extremity edema Gastrointestinal: Negative for abdominal pain, no bowel changes.  Musculoskeletal: Negative for gait problem or joint swelling.  Skin: Negative for rash.  Neurological: Negative for dizziness or headache.  No other specific complaints in a complete  review of systems (except as listed in HPI above).      Objective:    BP 138/76   Pulse 94   Temp 98.1 F (36.7 C) (Oral)   Resp 16   Ht 6' (1.829 m)   Wt 231 lb 3.2 oz (104.9 kg)   SpO2 98%   BMI 31.36 kg/m   Wt Readings from Last 3 Encounters:  03/31/23 231 lb 3.2 oz (104.9 kg)  03/21/23 229 lb (103.9 kg)  02/26/23 227 lb 3.2 oz (103.1 kg)    Physical Exam  Constitutional: Patient appears well-developed and well-nourished. Obese  No distress.  HEENT: head atraumatic, normocephalic, pupils equal and reactive to light, neck supple Cardiovascular: Normal rate, regular rhythm and normal heart sounds.  No murmur heard. 1+ BLE edema. Pulmonary/Chest: Effort normal and breath sounds normal. No respiratory distress. Abdominal: Soft.  There is no tenderness. Psychiatric: Patient has a normal mood and affect. behavior is normal. Judgment and thought content normal.  Results for orders placed or performed in visit on 01/29/23  Iron and TIBC([facility])  Result Value Ref Range   Iron 46 45 - 182 ug/dL   TIBC 469 629 - 528 ug/dL   Saturation Ratios 11 (L) 17.9 - 39.5 %   UIBC 356 ug/dL  Ferritin  Result Value Ref Range   Ferritin 14 (L) 24 - 336 ng/mL  Assessment & Plan:   Problem List Items Addressed This Visit       Cardiovascular and Mediastinum   Diastolic CHF (HCC)    BLE edema.  On torsemide 20 mg BID,  doing well, will check kidney function and send in refill.         Other   Lower extremity edema - Primary    BLE edema.  On torsemide 20 mg BID,  doing well, will check kidney function and send in refill.       Relevant Orders   Comprehensive metabolic panel     Follow up plan: Return in about 4 months (around 07/31/2023) for follow up.

## 2023-03-31 NOTE — Telephone Encounter (Signed)
Patient stated that the doctors he was in [place] suppose to had sent you a note for referral to ENT for possible vertigo. Can you make the referral

## 2023-03-31 NOTE — Assessment & Plan Note (Signed)
BLE edema.  On torsemide 20 mg BID,  doing well, will check kidney function and send in refill.

## 2023-03-31 NOTE — Telephone Encounter (Signed)
Patient notified

## 2023-04-01 ENCOUNTER — Other Ambulatory Visit: Payer: Self-pay | Admitting: Nurse Practitioner

## 2023-04-01 DIAGNOSIS — R600 Localized edema: Secondary | ICD-10-CM

## 2023-04-01 MED ORDER — TORSEMIDE 20 MG PO TABS: 20.0000 mg | ORAL_TABLET | Freq: Two times a day (BID) | ORAL | 1 refills | Status: DC

## 2023-04-02 ENCOUNTER — Other Ambulatory Visit: Payer: Self-pay | Admitting: Nephrology

## 2023-04-02 DIAGNOSIS — I10 Essential (primary) hypertension: Secondary | ICD-10-CM

## 2023-04-02 DIAGNOSIS — D631 Anemia in chronic kidney disease: Secondary | ICD-10-CM | POA: Diagnosis not present

## 2023-04-02 DIAGNOSIS — N2889 Other specified disorders of kidney and ureter: Secondary | ICD-10-CM | POA: Diagnosis not present

## 2023-04-02 DIAGNOSIS — N1832 Chronic kidney disease, stage 3b: Secondary | ICD-10-CM | POA: Diagnosis not present

## 2023-04-02 DIAGNOSIS — M1993 Secondary osteoarthritis, unspecified site: Secondary | ICD-10-CM

## 2023-04-02 DIAGNOSIS — E7800 Pure hypercholesterolemia, unspecified: Secondary | ICD-10-CM

## 2023-04-03 DIAGNOSIS — E785 Hyperlipidemia, unspecified: Secondary | ICD-10-CM | POA: Diagnosis not present

## 2023-04-03 DIAGNOSIS — G5602 Carpal tunnel syndrome, left upper limb: Secondary | ICD-10-CM | POA: Diagnosis not present

## 2023-04-03 DIAGNOSIS — E669 Obesity, unspecified: Secondary | ICD-10-CM | POA: Diagnosis not present

## 2023-04-03 DIAGNOSIS — Z6830 Body mass index (BMI) 30.0-30.9, adult: Secondary | ICD-10-CM | POA: Diagnosis not present

## 2023-04-03 DIAGNOSIS — I69351 Hemiplegia and hemiparesis following cerebral infarction affecting right dominant side: Secondary | ICD-10-CM | POA: Diagnosis not present

## 2023-04-03 DIAGNOSIS — N400 Enlarged prostate without lower urinary tract symptoms: Secondary | ICD-10-CM | POA: Diagnosis not present

## 2023-04-03 DIAGNOSIS — M170 Bilateral primary osteoarthritis of knee: Secondary | ICD-10-CM | POA: Diagnosis not present

## 2023-04-03 DIAGNOSIS — M25461 Effusion, right knee: Secondary | ICD-10-CM | POA: Diagnosis not present

## 2023-04-03 DIAGNOSIS — E871 Hypo-osmolality and hyponatremia: Secondary | ICD-10-CM | POA: Diagnosis not present

## 2023-04-03 DIAGNOSIS — I739 Peripheral vascular disease, unspecified: Secondary | ICD-10-CM | POA: Diagnosis not present

## 2023-04-03 DIAGNOSIS — I69393 Ataxia following cerebral infarction: Secondary | ICD-10-CM | POA: Diagnosis not present

## 2023-04-03 DIAGNOSIS — K581 Irritable bowel syndrome with constipation: Secondary | ICD-10-CM | POA: Diagnosis not present

## 2023-04-03 DIAGNOSIS — I2510 Atherosclerotic heart disease of native coronary artery without angina pectoris: Secondary | ICD-10-CM | POA: Diagnosis not present

## 2023-04-03 DIAGNOSIS — I129 Hypertensive chronic kidney disease with stage 1 through stage 4 chronic kidney disease, or unspecified chronic kidney disease: Secondary | ICD-10-CM | POA: Diagnosis not present

## 2023-04-03 DIAGNOSIS — G4733 Obstructive sleep apnea (adult) (pediatric): Secondary | ICD-10-CM | POA: Diagnosis not present

## 2023-04-03 DIAGNOSIS — N1832 Chronic kidney disease, stage 3b: Secondary | ICD-10-CM | POA: Diagnosis not present

## 2023-04-07 DIAGNOSIS — I129 Hypertensive chronic kidney disease with stage 1 through stage 4 chronic kidney disease, or unspecified chronic kidney disease: Secondary | ICD-10-CM | POA: Diagnosis not present

## 2023-04-07 DIAGNOSIS — G4733 Obstructive sleep apnea (adult) (pediatric): Secondary | ICD-10-CM | POA: Diagnosis not present

## 2023-04-07 DIAGNOSIS — M25461 Effusion, right knee: Secondary | ICD-10-CM | POA: Diagnosis not present

## 2023-04-07 DIAGNOSIS — E871 Hypo-osmolality and hyponatremia: Secondary | ICD-10-CM | POA: Diagnosis not present

## 2023-04-07 DIAGNOSIS — N400 Enlarged prostate without lower urinary tract symptoms: Secondary | ICD-10-CM | POA: Diagnosis not present

## 2023-04-07 DIAGNOSIS — M170 Bilateral primary osteoarthritis of knee: Secondary | ICD-10-CM | POA: Diagnosis not present

## 2023-04-07 DIAGNOSIS — I69351 Hemiplegia and hemiparesis following cerebral infarction affecting right dominant side: Secondary | ICD-10-CM | POA: Diagnosis not present

## 2023-04-07 DIAGNOSIS — K581 Irritable bowel syndrome with constipation: Secondary | ICD-10-CM | POA: Diagnosis not present

## 2023-04-07 DIAGNOSIS — N1832 Chronic kidney disease, stage 3b: Secondary | ICD-10-CM | POA: Diagnosis not present

## 2023-04-07 DIAGNOSIS — Z6830 Body mass index (BMI) 30.0-30.9, adult: Secondary | ICD-10-CM | POA: Diagnosis not present

## 2023-04-07 DIAGNOSIS — I2510 Atherosclerotic heart disease of native coronary artery without angina pectoris: Secondary | ICD-10-CM | POA: Diagnosis not present

## 2023-04-07 DIAGNOSIS — I69393 Ataxia following cerebral infarction: Secondary | ICD-10-CM | POA: Diagnosis not present

## 2023-04-07 DIAGNOSIS — I739 Peripheral vascular disease, unspecified: Secondary | ICD-10-CM | POA: Diagnosis not present

## 2023-04-07 DIAGNOSIS — G5602 Carpal tunnel syndrome, left upper limb: Secondary | ICD-10-CM | POA: Diagnosis not present

## 2023-04-07 DIAGNOSIS — E785 Hyperlipidemia, unspecified: Secondary | ICD-10-CM | POA: Diagnosis not present

## 2023-04-07 DIAGNOSIS — E669 Obesity, unspecified: Secondary | ICD-10-CM | POA: Diagnosis not present

## 2023-04-10 ENCOUNTER — Ambulatory Visit: Admission: RE | Admit: 2023-04-10 | Payer: Medicare Other | Source: Ambulatory Visit

## 2023-04-14 DIAGNOSIS — R42 Dizziness and giddiness: Secondary | ICD-10-CM | POA: Diagnosis not present

## 2023-04-17 ENCOUNTER — Other Ambulatory Visit: Payer: Self-pay | Admitting: Nurse Practitioner

## 2023-04-17 DIAGNOSIS — E782 Mixed hyperlipidemia: Secondary | ICD-10-CM

## 2023-04-18 ENCOUNTER — Other Ambulatory Visit: Payer: Self-pay | Admitting: Nephrology

## 2023-04-18 DIAGNOSIS — D631 Anemia in chronic kidney disease: Secondary | ICD-10-CM

## 2023-04-18 DIAGNOSIS — N2889 Other specified disorders of kidney and ureter: Secondary | ICD-10-CM

## 2023-04-18 DIAGNOSIS — N281 Cyst of kidney, acquired: Secondary | ICD-10-CM

## 2023-04-18 NOTE — Telephone Encounter (Signed)
Requested Prescriptions  Pending Prescriptions Disp Refills   pravastatin (PRAVACHOL) 80 MG tablet [Pharmacy Med Name: PRAVASTATIN 80MG  TABLETS] 90 tablet 2    Sig: TAKE 1 TABLET(80 MG) BY MOUTH AT BEDTIME     Cardiovascular:  Antilipid - Statins Failed - 04/17/2023  6:20 AM      Failed - Lipid Panel in normal range within the last 12 months    Cholesterol, Total  Date Value Ref Range Status  10/19/2015 174 100 - 199 mg/dL Final   Cholesterol  Date Value Ref Range Status  12/06/2022 133 0 - 200 mg/dL Final   LDL Cholesterol (Calc)  Date Value Ref Range Status  10/05/2021 151 (H) mg/dL (calc) Final    Comment:    Reference range: <100 . Desirable range <100 mg/dL for primary prevention;   <70 mg/dL for patients with CHD or diabetic patients  with > or = 2 CHD risk factors. [name] LDL-C is now calculated using the Martin-Hopkins  calculation, which is a validated novel method providing  better accuracy than the Friedewald equation in the  estimation of LDL-C.  Horald Pollen et al. Lenox Ahr. 1610;960(45): 2061-2068  (http://education.[facility].com/faq/FAQ164)    LDL Cholesterol  Date Value Ref Range Status  12/06/2022 68 0 - 99 mg/dL Final    Comment:           Total Cholesterol/HDL:CHD Risk Coronary Heart Disease Risk Table                     Men   Women  1/2 Average Risk   3.4   3.3  Average Risk       5.0   4.4  2 X Average Risk   9.6   7.1  3 X Average Risk  23.4   11.0        Use the calculated Patient Ratio above and the CHD Risk Table to determine the patient's CHD Risk.        ATP III CLASSIFICATION (LDL):  <100     mg/dL   Optimal  409-811  mg/dL   Near or Above                    Optimal  130-159  mg/dL   Borderline  914-782  mg/dL   High  >956     mg/dL   Very High Performed at [facility], [address] [place], [place] [address]    HDL  Date Value Ref Range Status  12/06/2022 48 >40 mg/dL Final  65/78/4696 48 >29 mg/dL Final    Triglycerides  Date Value Ref Range Status  12/06/2022 83 <150 mg/dL Final         Passed - Patient is not pregnant      Passed - Valid encounter within last 12 months    Recent Outpatient Visits           2 weeks ago Lower extremity edema   [facility] Berniece Salines, FNP   4 weeks ago Lower extremity edema   [facility] Margarita Mail, DO   2 months ago Primary hypertension   [facility] Berniece Salines, FNP   3 months ago Periumbilical abdominal pain   [place] [facility] Mecum, Oswaldo Conroy, PA-C   3 months ago Hospital discharge follow-up   [facility] Berniece Salines, [place]       Future Appointments  In 1 week Coralyn Helling, MD [facility] Pulmonary Care at [place]   In 6 months Richardo Hanks, Laurette Schimke, MD [facility]

## 2023-04-20 ENCOUNTER — Other Ambulatory Visit: Payer: Self-pay | Admitting: Nurse Practitioner

## 2023-04-20 DIAGNOSIS — I10 Essential (primary) hypertension: Secondary | ICD-10-CM

## 2023-04-22 DIAGNOSIS — M170 Bilateral primary osteoarthritis of knee: Secondary | ICD-10-CM | POA: Diagnosis not present

## 2023-04-22 NOTE — Telephone Encounter (Signed)
Requested Prescriptions  Pending Prescriptions Disp Refills   cloNIDine (CATAPRES) 0.2 MG tablet [Pharmacy Med Name: CLONIDINE 0.2MG  TABLETS] 180 tablet 0    Sig: TAKE 1 TABLET(0.2 MG) BY MOUTH TWICE DAILY     Cardiovascular:  Alpha-2 Agonists Passed - 04/20/2023  6:22 AM      Passed - Last BP in normal range    BP Readings from Last 1 Encounters:  03/31/23 138/76         Passed - Last Heart Rate in normal range    Pulse Readings from Last 1 Encounters:  03/31/23 94         Passed - Valid encounter within last 6 months    Recent Outpatient Visits           3 weeks ago Lower extremity edema   [place] [facility] Berniece Salines, FNP   1 month ago Lower extremity edema   [facility] Margarita Mail, DO   3 months ago Primary hypertension   [facility] Berniece Salines, FNP   3 months ago Periumbilical abdominal pain   [place] [facility] Mecum, Oswaldo Conroy, PA-C   4 months ago Hospital discharge follow-up   [facility] Berniece Salines, FNP       Future Appointments             In 3 days Coralyn Helling, MD [facility] Pulmonary Care at [place]   In 6 months Richardo Hanks, Laurette Schimke, MD [facility] Urology [place]

## 2023-04-23 DIAGNOSIS — I10 Essential (primary) hypertension: Secondary | ICD-10-CM | POA: Diagnosis not present

## 2023-04-23 DIAGNOSIS — D6869 Other thrombophilia: Secondary | ICD-10-CM | POA: Diagnosis not present

## 2023-04-25 ENCOUNTER — Ambulatory Visit: Payer: Medicare Other | Admitting: Pulmonary Disease

## 2023-04-25 ENCOUNTER — Encounter: Payer: Self-pay | Admitting: Pulmonary Disease

## 2023-04-25 ENCOUNTER — Ambulatory Visit
Admission: RE | Admit: 2023-04-25 | Discharge: 2023-04-25 | Disposition: A | Discharge status: Home / Self Care | Payer: Medicare Other | Source: Ambulatory Visit | Attending: Nephrology | Admitting: Nephrology

## 2023-04-25 VITALS — BP 146/70 | HR 81 | Temp 97.7°F | Ht 72.0 in | Wt 230.4 lb

## 2023-04-25 DIAGNOSIS — Z8673 Personal history of transient ischemic attack (TIA), and cerebral infarction without residual deficits: Secondary | ICD-10-CM | POA: Insufficient documentation

## 2023-04-25 DIAGNOSIS — I129 Hypertensive chronic kidney disease with stage 1 through stage 4 chronic kidney disease, or unspecified chronic kidney disease: Secondary | ICD-10-CM | POA: Diagnosis not present

## 2023-04-25 DIAGNOSIS — I2699 Other pulmonary embolism without acute cor pulmonale: Secondary | ICD-10-CM | POA: Insufficient documentation

## 2023-04-25 DIAGNOSIS — N289 Disorder of kidney and ureter, unspecified: Secondary | ICD-10-CM | POA: Diagnosis not present

## 2023-04-25 DIAGNOSIS — Z9079 Acquired absence of other genital organ(s): Secondary | ICD-10-CM | POA: Diagnosis not present

## 2023-04-25 DIAGNOSIS — M1993 Secondary osteoarthritis, unspecified site: Secondary | ICD-10-CM | POA: Insufficient documentation

## 2023-04-25 DIAGNOSIS — D631 Anemia in chronic kidney disease: Secondary | ICD-10-CM | POA: Insufficient documentation

## 2023-04-25 DIAGNOSIS — G4733 Obstructive sleep apnea (adult) (pediatric): Secondary | ICD-10-CM

## 2023-04-25 DIAGNOSIS — E7800 Pure hypercholesterolemia, unspecified: Secondary | ICD-10-CM | POA: Diagnosis not present

## 2023-04-25 DIAGNOSIS — I10 Essential (primary) hypertension: Secondary | ICD-10-CM | POA: Diagnosis not present

## 2023-04-25 DIAGNOSIS — I700 Atherosclerosis of aorta: Secondary | ICD-10-CM | POA: Insufficient documentation

## 2023-04-25 DIAGNOSIS — E785 Hyperlipidemia, unspecified: Secondary | ICD-10-CM | POA: Diagnosis not present

## 2023-04-25 DIAGNOSIS — N1832 Chronic kidney disease, stage 3b: Secondary | ICD-10-CM | POA: Diagnosis not present

## 2023-04-25 DIAGNOSIS — Z8546 Personal history of malignant neoplasm of prostate: Secondary | ICD-10-CM | POA: Diagnosis not present

## 2023-04-25 DIAGNOSIS — N2889 Other specified disorders of kidney and ureter: Secondary | ICD-10-CM | POA: Insufficient documentation

## 2023-04-25 MED ORDER — GADOBUTROL 1 MMOL/ML IV SOLN
10.0000 mL | Freq: Once | INTRAVENOUS | Status: AC | PRN
  Administered 2023-04-25: 10 mL via INTRAVENOUS

## 2023-04-25 NOTE — Patient Instructions (Signed)
Will have your CPAP setting changed to 5 - 13 cm water pressure.  Follow up in 1 year.

## 2023-04-25 NOTE — Progress Notes (Signed)
[place] [facility], Critical Care, and Sleep Medicine  Chief Complaint  Patient presents with   Follow-up    Wearing cpap nighty- pressure and mask is okay.     Past Surgical History:  He  has a past surgical history that includes Knee arthroscopy (Right); Prostatectomy; Lumbar laminectomy; abdominal ultrasound (Oct 2015); Colonoscopy; and CAROTID PTA/STENT INTERVENTION (Right, 11/26/2022).  Past Medical History:  BPH, Prostate cancer, Carpal tunnel, CKD 3a, Diverticulitis, HLD, HTN, IBS, OA, CAD, PE January 2024, CVA, HFpEF  Constitutional:  BP (!) 146/70 (BP Location: Left Arm, Cuff Size: Normal)   Pulse 81   Temp 97.7 F (36.5 C) (Temporal)   Ht 6' (1.829 m)   Wt 230 lb 6.4 oz (104.5 kg)   SpO2 100%   BMI 31.25 kg/m   Brief Summary:  Jared Cline is a 77 y.o. male with obstructive sleep apnea.      Subjective:   He did his home sleep study in November of last year.  Showed severe sleep apnea. Started on auto CPAP.  He uses CPAP nightly.  Feels like this has helped his sleep.  Has an nasal mask.  Not having sinus congestion or mouth leak.  Download shows AHI still slightly up.    Physical Exam:   Appearance - well kempt   ENMT - no sinus tenderness, no oral exudate, no LAN, Mallampati 3 airway, no stridor  Respiratory - equal breath sounds bilaterally, no wheezing or rales  CV - s1s2 regular rate and rhythm, no murmurs  Ext - no clubbing, no edema  Skin - no rashes  Psych - normal mood and affect    Chest imaging:  CT angio chest 08/25/22 >> acute PE with filling defect in RUL and LLL, Lt base pleural thickening, LLL ATX, 5 mm nodule RUL stable since 2019, multiple renal cysts  Sleep Tests:  HST 07/17/22 >> AHI 33.7, SpO2 low 77%  Auto CPAP 03/25/23 to 04/23/23 >> used on 30 of 30 nights with average 6 hrs 52 min.  Average AHI 12.7 with median CPAP 10 and 95 th percentile CPAP 11 cm H2O  Cardiac Tests:  Echo 12/06/22 >> EF 60 to 65%, grade 1 DD  Social  History:  He  reports that he quit smoking about 51 years ago. His smoking use included cigarettes. He started smoking about 60 years ago. He has a 4.5 pack-year smoking history. He has been exposed to tobacco smoke. He has never used smokeless tobacco. He reports that he does not drink alcohol and does not use drugs.  Family History:  His family history includes Alcohol abuse in his brother; Aneurysm in his father; Heart disease in his paternal uncle; Hypertension in his father and mother; Stroke in his mother.     Assessment/Plan:   Obstructive sleep apnea. - he is compliant with CPAP and reports benefit from therapy - uses [employer] for his DME - current CPAP ordered December 2023 - will change auto CPAP to 5 - 13 cm H2O - discussed how sleep apnea can impact his health  CKD 3a. - followed by Dr. Lorain Childes with [facility]  Coronary artery disease. - followed by Dr. Arnoldo Hooker with [facility] cardiology  Pulmonary embolism. - from January 2024 - eliquis per his PCP  Time Spent Involved in Patient Care on Day of Examination:  27 minutes  Follow up:   Patient Instructions  Will have your CPAP setting changed to 5 - 13 cm water pressure.  Follow up  in 1 year.  Medication List:   Allergies as of 04/25/2023       Reactions   Flomax [tamsulosin Hcl] Shortness Of Breath   Ace Inhibitors Other (See Comments)   Angioedema   Amlodipine Other (See Comments)   Angioedema   Doxazosin    Lipitor [atorvastatin] Other (See Comments)   Memory loss        Medication List        Accurate as of April 25, 2023  2:56 PM. If you have any questions, ask your nurse or doctor.          acetaminophen 650 MG CR tablet Commonly known as: TYLENOL Take 2 tablets (1,300 mg total) by mouth every 12 (twelve) hours as needed (Mild pain).   aspirin EC 81 MG tablet Take 1 tablet (81 mg total) by mouth daily at 6 (six) AM. Swallow whole.   cholecalciferol 25 MCG  (1000 UNIT) tablet Commonly known as: VITAMIN D3 Take 1,000 Units by mouth daily.   CINNAMON PO Take by mouth.   cloNIDine 0.2 MG tablet Commonly known as: CATAPRES TAKE 1 TABLET(0.2 MG) BY MOUTH TWICE DAILY   clopidogrel 75 MG tablet Commonly known as: Plavix Take 1 tablet (75 mg total) by mouth daily.   cyanocobalamin 1000 MCG tablet Commonly known as: VITAMIN B12 Take 1,000 mcg by mouth daily.   diclofenac Sodium 1 % Gel Commonly known as: VOLTAREN Apply 2 g topically 4 (four) times daily.   Eliquis 5 MG Tabs tablet Generic drug: apixaban TAKE 1 TABLET(5 MG) BY MOUTH TWICE DAILY   GARLIC PO Take by mouth.   hydrALAZINE 10 MG tablet Commonly known as: APRESOLINE TAKE 1 TABLET BY MOUTH TWICE DAILY AS NEEDED; TAKE IF BLOOD PRESSURE GREATER THAN 140/ 90   labetalol 100 MG tablet Commonly known as: NORMODYNE 100 mg 2 (two) times daily.   pravastatin 80 MG tablet Commonly known as: PRAVACHOL TAKE 1 TABLET(80 MG) BY MOUTH AT BEDTIME   Senexon-S 8.6-50 MG tablet Generic drug: senna-docusate Take 3 tablets by mouth daily at 6 (six) AM.   torsemide 20 MG tablet Commonly known as: DEMADEX Take 1 tablet (20 mg total) by mouth 2 (two) times daily.   valsartan 160 MG tablet Commonly known as: DIOVAN Take 1 tablet (160 mg total) by mouth daily.        Signature:  Coralyn Helling, MD [facility] Pulmonary/Critical Care Pager - [phone] 04/25/2023, 2:56 PM

## 2023-04-27 ENCOUNTER — Emergency Department
Admission: EM | Admit: 2023-04-27 | Discharge: 2023-04-27 | Disposition: A | Discharge status: Home / Self Care | Payer: Medicare Other | Attending: Emergency Medicine | Admitting: Emergency Medicine

## 2023-04-27 ENCOUNTER — Other Ambulatory Visit: Payer: Self-pay

## 2023-04-27 ENCOUNTER — Emergency Department: Payer: Medicare Other

## 2023-04-27 DIAGNOSIS — I129 Hypertensive chronic kidney disease with stage 1 through stage 4 chronic kidney disease, or unspecified chronic kidney disease: Secondary | ICD-10-CM | POA: Diagnosis not present

## 2023-04-27 DIAGNOSIS — I2510 Atherosclerotic heart disease of native coronary artery without angina pectoris: Secondary | ICD-10-CM | POA: Diagnosis not present

## 2023-04-27 DIAGNOSIS — N182 Chronic kidney disease, stage 2 (mild): Secondary | ICD-10-CM | POA: Diagnosis not present

## 2023-04-27 DIAGNOSIS — R918 Other nonspecific abnormal finding of lung field: Secondary | ICD-10-CM | POA: Diagnosis not present

## 2023-04-27 DIAGNOSIS — D649 Anemia, unspecified: Secondary | ICD-10-CM | POA: Diagnosis not present

## 2023-04-27 DIAGNOSIS — R0789 Other chest pain: Secondary | ICD-10-CM | POA: Diagnosis not present

## 2023-04-27 DIAGNOSIS — Z8546 Personal history of malignant neoplasm of prostate: Secondary | ICD-10-CM | POA: Insufficient documentation

## 2023-04-27 DIAGNOSIS — I700 Atherosclerosis of aorta: Secondary | ICD-10-CM | POA: Diagnosis not present

## 2023-04-27 DIAGNOSIS — Z7982 Long term (current) use of aspirin: Secondary | ICD-10-CM | POA: Insufficient documentation

## 2023-04-27 DIAGNOSIS — Z7901 Long term (current) use of anticoagulants: Secondary | ICD-10-CM | POA: Insufficient documentation

## 2023-04-27 DIAGNOSIS — Z79899 Other long term (current) drug therapy: Secondary | ICD-10-CM | POA: Diagnosis not present

## 2023-04-27 DIAGNOSIS — R079 Chest pain, unspecified: Secondary | ICD-10-CM | POA: Insufficient documentation

## 2023-04-27 LAB — BASIC METABOLIC PANEL
Anion gap: 11 (ref 5–15)
BUN: 35 mg/dL — ABNORMAL HIGH (ref 8–23)
CO2: 26 mmol/L (ref 22–32)
Calcium: 9.1 mg/dL (ref 8.9–10.3)
Chloride: 98 mmol/L (ref 98–111)
Creatinine, Ser: 1.8 mg/dL — ABNORMAL HIGH (ref 0.61–1.24)
GFR, Estimated: 38 mL/min — ABNORMAL LOW (ref >60)
Glucose, Bld: 111 mg/dL — ABNORMAL HIGH (ref 70–99)
Potassium: 3.7 mmol/L (ref 3.5–5.1)
Sodium: 135 mmol/L (ref 135–145)

## 2023-04-27 LAB — CBC
HCT: 27.9 % — ABNORMAL LOW (ref 39.0–52.0)
Hemoglobin: 8.7 g/dL — ABNORMAL LOW (ref 13.0–17.0)
MCH: 26.4 pg (ref 26.0–34.0)
MCHC: 31.2 g/dL (ref 30.0–36.0)
MCV: 84.5 fL (ref 80.0–100.0)
Platelets: 356 10*3/uL (ref 150–400)
RBC: 3.3 MIL/uL — ABNORMAL LOW (ref 4.22–5.81)
RDW: 12.9 % (ref 11.5–15.5)
WBC: 7.1 10*3/uL (ref 4.0–10.5)
nRBC: 0 % (ref 0.0–0.2)

## 2023-04-27 LAB — TROPONIN I (HIGH SENSITIVITY)
Troponin I (High Sensitivity): 11 ng/L (ref <18)
Troponin I (High Sensitivity): 10 ng/L (ref <18)

## 2023-04-27 MED ORDER — FERROUS SULFATE 325 (65 FE) MG PO TBEC: 325.0000 mg | DELAYED_RELEASE_TABLET | Freq: Every day | ORAL | 3 refills | Status: DC

## 2023-04-27 MED ORDER — IOHEXOL 350 MG/ML SOLN
75.0000 mL | Freq: Once | INTRAVENOUS | Status: AC | PRN
  Administered 2023-04-27: 75 mL via INTRAVENOUS

## 2023-04-27 MED ORDER — SODIUM CHLORIDE 0.9 % IV BOLUS (SEPSIS)
500.0000 mL | Freq: Once | INTRAVENOUS | Status: AC
  Administered 2023-04-27: 500 mL via INTRAVENOUS

## 2023-04-27 NOTE — ED Provider Notes (Signed)
[facility] Provider Note    Event Date/Time   First MD Initiated Contact with Patient 04/27/23 0543     (approximate)   History   Chest Pain   HPI  Jared Cline is a 77 y.o. male with history of prostate cancer, hypertension, hyperlipidemia, stroke, CKD, prior PE on Eliquis who presents to the emergency department with complaints of left-sided sharp chest pain without radiation and shortness of breath that started about 2 hours ago.  Pain came on while at rest.  No aggravating or alleviating factors.  Pain is now gone.  No fevers, cough, nausea, vomiting, dizziness, diaphoresis.  No tightness or pressure.   History provided by patient, wife.    Past Medical History:  Diagnosis Date   BPH (benign prostatic hypertrophy)    Bradycardia    evaluated by Dr. Gwen Pounds   Cancer [facility])    prostate   Carpal tunnel syndrome, left    left arm   CKD (chronic kidney disease) stage 2, GFR 60-89 ml/min    Diverticulitis    Gastritis 2017   History of prostate cancer    HNP (herniated nucleus pulposus), lumbar    Hyperlipidemia    Hypertension    IBS (irritable bowel syndrome)    Impotence    Multiple lung nodules    nonspecific, largest 5mm in Jan 2016; scanned March 2017; rescan 3-6 months later   OA (osteoarthritis) of knee    right   Renal mass    evaluated; followed by urologist   Sleep apnea    on CPAP   Stroke (HCC)    Torn meniscus    x 2; right knee    Past Surgical History:  Procedure Laterality Date   abdominal ultrasound  Oct 2015   Negative for eval for AAA   CAROTID PTA/STENT INTERVENTION Right 11/26/2022   Procedure: CAROTID PTA/STENT INTERVENTION;  Surgeon: Renford Dills, MD;  Location: [facility] INVASIVE CV LAB;  Service: Cardiovascular;  Laterality: Right;   COLONOSCOPY     KNEE ARTHROSCOPY Right    x 2   LUMBAR LAMINECTOMY     PROSTATECTOMY     robotic    MEDICATIONS:  Prior to Admission medications   Medication Sig Start  Date End Date Taking? Authorizing Provider  acetaminophen (TYLENOL) 650 MG CR tablet Take 2 tablets (1,300 mg total) by mouth every 12 (twelve) hours as needed (Mild pain). 08/29/22   Hongalgi, Maximino Greenland, MD  aspirin EC 81 MG tablet Take 1 tablet (81 mg total) by mouth daily at 6 (six) AM. Swallow whole. 11/28/22   Pace, Peggye Ley, NP  cholecalciferol (VITAMIN D3) 25 MCG (1000 UNIT) tablet Take 1,000 Units by mouth daily.    [provider]  CINNAMON PO Take by mouth.    [provider]  cloNIDine (CATAPRES) 0.2 MG tablet TAKE 1 TABLET(0.2 MG) BY MOUTH TWICE DAILY 04/22/23   Berniece Salines, FNP  clopidogrel (PLAVIX) 75 MG tablet Take 1 tablet (75 mg total) by mouth daily. 10/21/22   Schnier, Latina Craver, MD  cyanocobalamin (VITAMIN B12) 1000 MCG tablet Take 1,000 mcg by mouth daily.    [provider]  diclofenac Sodium (VOLTAREN) 1 % GEL Apply 2 g topically 4 (four) times daily. 09/09/22   Love, Evlyn Kanner, PA-C  ELIQUIS 5 MG TABS tablet TAKE 1 TABLET(5 MG) BY MOUTH TWICE DAILY 02/13/23   Earna Coder, MD  GARLIC PO Take by mouth.    [provider]  hydrALAZINE (APRESOLINE) 10 MG tablet TAKE 1 TABLET BY MOUTH TWICE DAILY AS NEEDED; TAKE IF BLOOD PRESSURE GREATER THAN 140/ 90 01/22/23   Berniece Salines, FNP  labetalol (NORMODYNE) 100 MG tablet 100 mg 2 (two) times daily. 10/02/22   [provider]  pravastatin (PRAVACHOL) 80 MG tablet TAKE 1 TABLET(80 MG) BY MOUTH AT BEDTIME 04/18/23   Berniece Salines, FNP  senna-docusate (SENOKOT-S) 8.6-50 MG tablet Take 3 tablets by mouth daily at 6 (six) AM. 09/09/22   Love, Evlyn Kanner, PA-C  torsemide (DEMADEX) 20 MG tablet Take 1 tablet (20 mg total) by mouth 2 (two) times daily. 04/01/23   Berniece Salines, FNP  valsartan (DIOVAN) 160 MG tablet Take 1 tablet (160 mg total) by mouth daily. 12/09/22   Marrion Coy, MD    Physical Exam   Triage Vital Signs: ED Triage Vitals  Encounter Vitals Group     BP 04/27/23 0515 (!)  151/53     Systolic BP Percentile --      Diastolic BP Percentile --      Pulse Rate 04/27/23 0515 66     Resp 04/27/23 0515 16     Temp 04/27/23 0515 98.1 F (36.7 C)     Temp src --      SpO2 04/27/23 0515 98 %     Weight --      Height 04/27/23 0509 6' (1.829 m)     Head Circumference --      Peak Flow --      Pain Score 04/27/23 0508 7     Pain Loc --      Pain Education --      Exclude from Growth Chart --     Most recent vital signs: Vitals:   04/27/23 0515  BP: (!) 151/53  Pulse: 66  Resp: 16  Temp: 98.1 F (36.7 C)  SpO2: 98%    CONSTITUTIONAL: Alert, responds appropriately to questions. Well-appearing; well-nourished HEAD: Normocephalic, atraumatic EYES: Conjunctivae clear, pupils appear equal, sclera nonicteric ENT: normal nose; moist mucous membranes NECK: Supple, normal ROM CARD: RRR; S1 and S2 appreciated, chest wall nontender to palpation RESP: Normal chest excursion without splinting or tachypnea; breath sounds clear and equal bilaterally; no wheezes, no rhonchi, no rales, no hypoxia or respiratory distress, speaking full sentences ABD/GI: Non-distended; soft, non-tender, no rebound, no guarding, no peritoneal signs BACK: The back appears normal EXT: Normal ROM in all joints; no deformity noted, no edema, no calf tenderness or calf swelling SKIN: Normal color for age and race; warm; no rash on exposed skin NEURO: Moves all extremities equally, normal speech PSYCH: The patient's mood and manner are appropriate.   ED Results / Procedures / Treatments   LABS: (all labs ordered are listed, but only abnormal results are displayed) Labs Reviewed  BASIC METABOLIC PANEL - Abnormal; Notable for the following components:      Result Value   Glucose, Bld 111 (*)    BUN 35 (*)    Creatinine, Ser 1.80 (*)    GFR, Estimated 38 (*)    All other components within normal limits  CBC - Abnormal; Notable for the following components:   RBC 3.30 (*)    Hemoglobin  8.7 (*)    HCT 27.9 (*)    All other components within normal limits  TROPONIN I (HIGH SENSITIVITY)     EKG:  EKG Interpretation Date/Time:  Sunday April 27 2023 05:08:43 EDT Ventricular Rate:  67 PR Interval:  210  QRS Duration:  100 QT Interval:  404 QTC Calculation: 426 R Axis:   -31  Text Interpretation: Sinus rhythm with 1st degree A-V block Left axis deviation Abnormal ECG When compared with ECG of 05-Dec-2022 14:09, Premature atrial complexes are no longer Present Confirmed by Rochele Raring [phone]) on 04/27/2023 5:43:50 AM         RADIOLOGY: My personal review and interpretation of imaging: Chest x-ray clear.  I have personally reviewed all radiology reports.   No results found.   PROCEDURES:  Critical Care performed: No   CRITICAL CARE Performed by: Rochele Raring   Total critical care time: 0 minutes  Critical care time was exclusive of separately billable procedures and treating other patients.  Critical care was necessary to treat or prevent imminent or life-threatening deterioration.  Critical care was time spent personally by me on the following activities: development of treatment plan with patient and/or surrogate as well as nursing, discussions with consultants, evaluation of patient's response to treatment, examination of patient, obtaining history from patient or surrogate, ordering and performing treatments and interventions, ordering and review of laboratory studies, ordering and review of radiographic studies, pulse oximetry and re-evaluation of patient's condition.   [name]1-3 Lead EKG Interpretation  Performed by: Adali Pennings, Layla Maw, DO Authorized by: Delanee Xin, Layla Maw, DO     Interpretation: normal     ECG rate:  66   ECG rate assessment: normal     Rhythm: sinus rhythm     Ectopy: none     Conduction: normal       IMPRESSION / MDM / ASSESSMENT AND PLAN / ED COURSE  I reviewed the triage vital signs and the nursing notes.    Patient  here with chest pain.  Currently asymptomatic.  The patient is on the cardiac monitor to evaluate for evidence of arrhythmia and/or significant heart rate changes.   DIFFERENTIAL DIAGNOSIS (includes but not limited to):   ACS, PE, CHF, musculoskeletal pain, GERD, doubt dissection   Patient's presentation is most consistent with acute presentation with potential threat to life or bodily function.   PLAN: Will obtain cardiac labs, chest x-ray.  EKG nonischemic.  Patient high risk for PE given previous history of the same.  He is on Eliquis but will obtain CTA of the chest.  Will continue to monitor.  Discussed with patient if workup is unremarkable including 2 negative troponins and he continues to be asymptomatic, I recommend follow-up with his cardiologist at [facility] clinic.  He is comfortable with this plan.   MEDICATIONS GIVEN IN ED: Medications  sodium chloride 0.9 % bolus 500 mL (has no administration in time range)  iohexol (OMNIPAQUE) 350 MG/ML injection 75 mL (75 mLs Intravenous Contrast Given 04/27/23 0622)     ED COURSE: Labs show chronic kidney disease with creatinine of 1.8.  Was 1.65 on 03/31/2023.  Will give IV fluids.  Also has some mild anemia today with hemoglobin of 8.7 with no rectal bleeding.  Will start him on iron supplementation.  Could be from his chronic kidney disease.  First troponin negative.  Second pending.  Chest x-ray reviewed and interpreted by myself and the radiologist and shows no acute abnormality.  CTA chest pending.  Patient signed out to oncoming ED physician.  CONSULTS: Pending further workup.   OUTSIDE RECORDS REVIEWED: Reviewed last cardiology note on 02/07/2023.       FINAL CLINICAL IMPRESSION(S) / ED DIAGNOSES   Final diagnoses:  Nonspecific chest pain  Anemia, unspecified type  Rx / DC Orders   ED Discharge Orders          Ordered    ferrous sulfate 325 (65 FE) MG EC tablet  Daily with breakfast        04/27/23 1610              Note:  This document was prepared using Dragon voice recognition software and may include unintentional dictation errors.   Oliwia Berzins, Layla Maw, DO 04/27/23 [phone]

## 2023-04-27 NOTE — ED Provider Notes (Signed)
-----------------------------------------   8:31 AM on 04/27/2023 ----------------------------------------- Patient's workup has resulted showing no significant findings, repeat troponin is negative.  CTA of the chest shows no acute finding.  Patient's hemoglobin is slightly lower he denies any black or bloody stool.  I asked that the patient follow-up with his primary care doctor for recheck of his hemoglobin in 1 to 2 weeks.  Patient agreeable to plan.  Provided my typical chest pain return precautions.  As the patient appears well chest pain-free with a reassuring workup I believe the patient safe for discharge home with PCP follow-up.   Minna Antis, MD 04/27/23 [phone]

## 2023-04-27 NOTE — Discharge Instructions (Addendum)
Your hemoglobin (blood count) was 8.7 today and your creatinine (a marker of kidney function) was 1.8.  You have had a progressive decline in your hemoglobin and a progressive uptrend of your creatinine.  I recommend close follow-up with your PCP and nephrologist for this.  Please follow-up closely with your cardiologist as well.  Your cardiac labs today were normal.

## 2023-04-27 NOTE — ED Triage Notes (Signed)
Pt states that he was up going to the bathroom at around 0230 and started having centralized chest pain that he describes as sharp. Denies having cardiac history. States he is having just a little shortness of breath right.

## 2023-04-28 NOTE — Group Note (Deleted)

## 2023-04-29 ENCOUNTER — Other Ambulatory Visit: Payer: Self-pay | Admitting: Nurse Practitioner

## 2023-04-29 DIAGNOSIS — I10 Essential (primary) hypertension: Secondary | ICD-10-CM

## 2023-04-30 ENCOUNTER — Ambulatory Visit (INDEPENDENT_AMBULATORY_CARE_PROVIDER_SITE_OTHER): Payer: Medicare Other | Admitting: Nurse Practitioner

## 2023-04-30 ENCOUNTER — Other Ambulatory Visit
Admission: RE | Admit: 2023-04-30 | Discharge: 2023-04-30 | Disposition: A | Discharge status: Home / Self Care | Payer: Medicare Other | Source: Ambulatory Visit | Attending: Nurse Practitioner | Admitting: Nurse Practitioner

## 2023-04-30 ENCOUNTER — Encounter: Payer: Self-pay | Admitting: Nurse Practitioner

## 2023-04-30 VITALS — BP 150/68 | HR 75 | Temp 97.9°F | Wt 228.7 lb

## 2023-04-30 DIAGNOSIS — I119 Hypertensive heart disease without heart failure: Secondary | ICD-10-CM | POA: Diagnosis not present

## 2023-04-30 DIAGNOSIS — D649 Anemia, unspecified: Secondary | ICD-10-CM | POA: Diagnosis not present

## 2023-04-30 DIAGNOSIS — I1A0 Resistant hypertension: Secondary | ICD-10-CM | POA: Diagnosis not present

## 2023-04-30 DIAGNOSIS — N1831 Chronic kidney disease, stage 3a: Secondary | ICD-10-CM

## 2023-04-30 DIAGNOSIS — I25119 Atherosclerotic heart disease of native coronary artery with unspecified angina pectoris: Secondary | ICD-10-CM | POA: Diagnosis not present

## 2023-04-30 DIAGNOSIS — G4733 Obstructive sleep apnea (adult) (pediatric): Secondary | ICD-10-CM | POA: Diagnosis not present

## 2023-04-30 DIAGNOSIS — R079 Chest pain, unspecified: Secondary | ICD-10-CM | POA: Diagnosis not present

## 2023-04-30 DIAGNOSIS — R600 Localized edema: Secondary | ICD-10-CM | POA: Insufficient documentation

## 2023-04-30 LAB — BRAIN NATRIURETIC PEPTIDE: B Natriuretic Peptide: 107.7 pg/mL — ABNORMAL HIGH (ref 0.0–100.0)

## 2023-04-30 NOTE — Assessment & Plan Note (Signed)
Gfr in er was lower than usual, will recheck

## 2023-04-30 NOTE — Progress Notes (Signed)
BP (!) 150/68   Pulse 75   Temp 97.9 F (36.6 C)   Wt 228 lb 11.2 oz (103.7 kg)   SpO2 99%   BMI 31.02 kg/m    Subjective:    Patient ID: Jared Cline, male    DOB: 10-28-1945, 77 y.o.   MRN: [id number]  HPI: Jared Cline is a 77 y.o. male  Chief Complaint  Patient presents with   Hospitalization Follow-up    Normal scans, seen cardio 2hr ago.    ER follow up/chest pain: patient was seen at [facility] ER on 04/27/2023.  ER note: Jared Cline is a 77 y.o. male with history of prostate cancer, hypertension, hyperlipidemia, stroke, CKD, prior PE on Eliquis who presents to the emergency department with complaints of left-sided sharp chest pain without radiation and shortness of breath that started about 2 hours ago.  Pain came on while at rest.  No aggravating or alleviating factors.  Pain is now gone.  No fevers, cough, nausea, vomiting, dizziness, diaphoresis.  No tightness or pressure.   Labs completed which showed some anemia,  he was started on iron supplement, patient had reported to ER provider that he had no rectal bleeding. Will get repeat labs. Troponins were negative. Kidney function worse then previous results.  Will recheck.  EKG was sinus rhythm with 1st degree block. Patient had follow up appointment with cardiology earlier today.  He says he has tests ordered for Tuesday.      Latest Ref Rng & Units 04/27/2023    5:21 AM 01/29/2023   10:16 AM 01/22/2023   11:14 AM  CBC  WBC 4.0 - 10.5 K/uL 7.1  7.7  7.8   Hemoglobin 13.0 - 17.0 g/dL 8.7  40.9  81.1   Hematocrit 39.0 - 52.0 % 27.9  35.0  35.4   Platelets 150 - 400 K/uL 356  316  361     Imaging: Ct chest: MPRESSION: 1. No evidence of pulmonary embolism. No acute findings in the thorax to account for the patient's symptoms. 2. Aortic atherosclerosis, in addition to left main and three-vessel coronary artery disease. Assessment for potential risk factor modification, dietary therapy or pharmacologic therapy may be warranted, if  clinically indicated. 3. There are calcifications of the aortic valve. Echocardiographic correlation for evaluation of potential valvular dysfunction may be warranted if clinically indicated. 4. Small pulmonary nodules measuring 4 mm or less in size in the lungs bilaterally, stable compared to prior examination dating back to 2019, considered definitively benign requiring no imaging follow-up.  Chest xray:  IMPRESSION: 1.  No radiographic evidence of acute cardiopulmonary disease. 2. Aortic atherosclerosis   Relevant past medical, surgical, family and social history reviewed and updated as indicated. Interim medical history since our last visit reviewed. Allergies and medications reviewed and updated.  Review of Systems  Constitutional: Negative for fever or weight change.  Respiratory: Negative for cough and shortness of breath.   Cardiovascular: Negative for chest pain or palpitations.  Gastrointestinal: Negative for abdominal pain, no bowel changes.  Musculoskeletal: Negative for gait problem or joint swelling.  Skin: Negative for rash.  Neurological: Negative for dizziness or headache.  No other specific complaints in a complete review of systems (except as listed in HPI above).      Objective:    BP (!) 150/68   Pulse 75   Temp 97.9 F (36.6 C)   Wt 228 lb 11.2 oz (103.7 kg)   SpO2 99%   BMI 31.02 kg/m  Wt Readings from Last 3 Encounters:  04/30/23 228 lb 11.2 oz (103.7 kg)  04/25/23 230 lb 6.4 oz (104.5 kg)  03/31/23 231 lb 3.2 oz (104.9 kg)    Physical Exam  Constitutional: Patient appears well-developed and well-nourished. Obese  No distress.  HEENT: head atraumatic, normocephalic, pupils equal and reactive to light, neck supple Cardiovascular: Normal rate, regular rhythm and normal heart sounds.  No murmur heard. No BLE edema. Pulmonary/Chest: Effort normal and breath sounds normal. No respiratory distress. Abdominal: Soft.  There is no  tenderness. Psychiatric: Patient has a normal mood and affect. behavior is normal. Judgment and thought content normal.  Results for orders placed or performed during the hospital encounter of 04/30/23  Brain natriuretic peptide  Result Value Ref Range   B Natriuretic Peptide 107.7 (H) 0.0 - 100.0 pg/mL      Assessment & Plan:   Problem List Items Addressed This Visit       Genitourinary   Stage 3a chronic kidney disease (HCC)    Gfr in er was lower than usual, will recheck      Relevant Orders   COMPLETE METABOLIC PANEL WITH GFR   Other Visit Diagnoses     Chest pain, unspecified type    -  Primary   resolved, getting repeat labs to check abnormals from er   Anemia, unspecified type       getting labs, and stool cards x3   Relevant Orders   CBC with Differential/Platelet   Iron, TIBC and Ferritin Panel        Follow up plan: Return if symptoms worsen or fail to improve.

## 2023-04-30 NOTE — Telephone Encounter (Signed)
Requested Prescriptions  Pending Prescriptions Disp Refills   hydrALAZINE (APRESOLINE) 10 MG tablet [Pharmacy Med Name: HYDRALAZINE 10 MG TABLETS (ORANGE)] 180 tablet 0    Sig: TAKE 1 TABLET BY MOUTH TWICE DAILY AS NEEDED. TAKE IF BLOOD PRESSURE GREATER THAN 140/90     Cardiovascular:  Vasodilators Failed - 04/29/2023 10:36 AM      Failed - HCT in normal range and within 360 days    HCT  Date Value Ref Range Status  04/27/2023 27.9 (L) 39.0 - 52.0 % Final   Hematocrit  Date Value Ref Range Status  10/19/2015 37.6 37.5 - 51.0 % Final         Failed - HGB in normal range and within 360 days    Hemoglobin  Date Value Ref Range Status  04/27/2023 8.7 (L) 13.0 - 17.0 g/dL Final  40/05/2724 36.6 (L) 12.6 - 17.7 g/dL Final         Failed - RBC in normal range and within 360 days    RBC  Date Value Ref Range Status  04/27/2023 3.30 (L) 4.22 - 5.81 MIL/uL Final         Failed - ANA Screen, Ifa, Serum in normal range and within 360 days    No results found for: "ANA", "ANATITER", "LABANTI"       Failed - Last BP in normal range    BP Readings from Last 1 Encounters:  04/27/23 (!) 151/53         Passed - WBC in normal range and within 360 days    WBC  Date Value Ref Range Status  04/27/2023 7.1 4.0 - 10.5 K/uL Final         Passed - PLT in normal range and within 360 days    Platelets  Date Value Ref Range Status  04/27/2023 356 150 - 400 K/uL Final  10/19/2015 343 150 - 379 x10E3/uL Final         Passed - Valid encounter within last 12 months    Recent Outpatient Visits           1 month ago Lower extremity edema   [facility] Berniece Salines, FNP   1 month ago Lower extremity edema   [facility] Margarita Mail, DO   3 months ago Primary hypertension   [facility] Berniece Salines, FNP   3 months ago Periumbilical abdominal pain   [facility] Mecum, Oswaldo Conroy,  PA-C   4 months ago Hospital discharge follow-up   [facility] Berniece Salines, [place]       Future Appointments             Today Zane Herald, Rudolpho Sevin, FNP [place]   In 6 months Richardo Hanks, Laurette Schimke, MD [facility] [place]

## 2023-05-01 ENCOUNTER — Other Ambulatory Visit: Payer: Self-pay | Admitting: Nurse Practitioner

## 2023-05-01 DIAGNOSIS — Z8673 Personal history of transient ischemic attack (TIA), and cerebral infarction without residual deficits: Secondary | ICD-10-CM

## 2023-05-01 DIAGNOSIS — I700 Atherosclerosis of aorta: Secondary | ICD-10-CM

## 2023-05-01 DIAGNOSIS — I25119 Atherosclerotic heart disease of native coronary artery with unspecified angina pectoris: Secondary | ICD-10-CM

## 2023-05-01 DIAGNOSIS — Z86711 Personal history of pulmonary embolism: Secondary | ICD-10-CM

## 2023-05-01 DIAGNOSIS — I1A0 Resistant hypertension: Secondary | ICD-10-CM

## 2023-05-01 DIAGNOSIS — I6523 Occlusion and stenosis of bilateral carotid arteries: Secondary | ICD-10-CM

## 2023-05-01 DIAGNOSIS — R600 Localized edema: Secondary | ICD-10-CM

## 2023-05-01 LAB — CBC WITH DIFFERENTIAL/PLATELET
Absolute Monocytes: 670 {cells}/uL (ref 200–950)
Basophils Absolute: 44 {cells}/uL (ref 0–200)
Basophils Relative: 0.5 %
Eosinophils Absolute: 113 {cells}/uL (ref 15–500)
Eosinophils Relative: 1.3 %
HCT: 28.7 % — ABNORMAL LOW (ref 38.5–50.0)
Hemoglobin: 8.9 g/dL — ABNORMAL LOW (ref 13.2–17.1)
Lymphs Abs: 2558 {cells}/uL (ref 850–3900)
MCH: 25.8 pg — ABNORMAL LOW (ref 27.0–33.0)
MCHC: 31 g/dL — ABNORMAL LOW (ref 32.0–36.0)
MCV: 83.2 fL (ref 80.0–100.0)
MPV: 9.7 fL (ref 7.5–12.5)
Monocytes Relative: 7.7 %
Neutro Abs: 5316 {cells}/uL (ref 1500–7800)
Neutrophils Relative %: 61.1 %
Platelets: 407 10*3/uL — ABNORMAL HIGH (ref 140–400)
RBC: 3.45 10*6/uL — ABNORMAL LOW (ref 4.20–5.80)
RDW: 12.9 % (ref 11.0–15.0)
Total Lymphocyte: 29.4 %
WBC: 8.7 10*3/uL (ref 3.8–10.8)

## 2023-05-01 LAB — COMPLETE METABOLIC PANEL WITH GFR
AG Ratio: 2.3 (calc) (ref 1.0–2.5)
ALT: 11 U/L (ref 9–46)
AST: 14 U/L (ref 10–35)
Albumin: 4.9 g/dL (ref 3.6–5.1)
Alkaline phosphatase (APISO): 52 U/L (ref 35–144)
BUN/Creatinine Ratio: 13 (calc) (ref 6–22)
BUN: 24 mg/dL (ref 7–25)
CO2: 26 mmol/L (ref 20–32)
Calcium: 9.9 mg/dL (ref 8.6–10.3)
Chloride: 99 mmol/L (ref 98–110)
Creat: 1.83 mg/dL — ABNORMAL HIGH (ref 0.70–1.28)
Globulin: 2.1 g/dL (ref 1.9–3.7)
Glucose, Bld: 91 mg/dL (ref 65–99)
Potassium: 4.4 mmol/L (ref 3.5–5.3)
Sodium: 136 mmol/L (ref 135–146)
Total Bilirubin: 0.3 mg/dL (ref 0.2–1.2)
Total Protein: 7 g/dL (ref 6.1–8.1)
eGFR: 37 mL/min/{1.73_m2} — ABNORMAL LOW (ref >=60)

## 2023-05-01 LAB — IRON,TIBC AND FERRITIN PANEL
%SAT: 14 % — ABNORMAL LOW (ref 20–48)
Ferritin: 8 ng/mL — ABNORMAL LOW (ref 24–380)
Iron: 57 ug/dL (ref 50–180)
TIBC: 413 ug/dL (ref 250–425)

## 2023-05-02 ENCOUNTER — Inpatient Hospital Stay: Payer: Medicare Other | Attending: Internal Medicine

## 2023-05-02 ENCOUNTER — Inpatient Hospital Stay: Payer: Medicare Other | Admitting: Internal Medicine

## 2023-05-02 ENCOUNTER — Encounter: Payer: Self-pay | Admitting: Internal Medicine

## 2023-05-02 ENCOUNTER — Telehealth: Payer: Self-pay

## 2023-05-02 VITALS — BP 112/53 | HR 80 | Temp 98.6°F | Ht 72.0 in | Wt 229.6 lb

## 2023-05-02 DIAGNOSIS — N281 Cyst of kidney, acquired: Secondary | ICD-10-CM | POA: Diagnosis not present

## 2023-05-02 DIAGNOSIS — D631 Anemia in chronic kidney disease: Secondary | ICD-10-CM | POA: Insufficient documentation

## 2023-05-02 DIAGNOSIS — I700 Atherosclerosis of aorta: Secondary | ICD-10-CM | POA: Diagnosis not present

## 2023-05-02 DIAGNOSIS — N183 Chronic kidney disease, stage 3 unspecified: Secondary | ICD-10-CM | POA: Diagnosis not present

## 2023-05-02 DIAGNOSIS — Z7901 Long term (current) use of anticoagulants: Secondary | ICD-10-CM | POA: Insufficient documentation

## 2023-05-02 DIAGNOSIS — I2699 Other pulmonary embolism without acute cor pulmonale: Secondary | ICD-10-CM

## 2023-05-02 DIAGNOSIS — D509 Iron deficiency anemia, unspecified: Secondary | ICD-10-CM | POA: Insufficient documentation

## 2023-05-02 DIAGNOSIS — Z8673 Personal history of transient ischemic attack (TIA), and cerebral infarction without residual deficits: Secondary | ICD-10-CM | POA: Diagnosis not present

## 2023-05-02 DIAGNOSIS — I2510 Atherosclerotic heart disease of native coronary artery without angina pectoris: Secondary | ICD-10-CM | POA: Diagnosis not present

## 2023-05-02 DIAGNOSIS — Z87891 Personal history of nicotine dependence: Secondary | ICD-10-CM | POA: Insufficient documentation

## 2023-05-02 DIAGNOSIS — D649 Anemia, unspecified: Secondary | ICD-10-CM | POA: Insufficient documentation

## 2023-05-02 DIAGNOSIS — R609 Edema, unspecified: Secondary | ICD-10-CM | POA: Diagnosis not present

## 2023-05-02 DIAGNOSIS — Z86711 Personal history of pulmonary embolism: Secondary | ICD-10-CM | POA: Insufficient documentation

## 2023-05-02 DIAGNOSIS — N1832 Chronic kidney disease, stage 3b: Secondary | ICD-10-CM | POA: Insufficient documentation

## 2023-05-02 LAB — CMP (CANCER CENTER ONLY)
ALT: 15 U/L (ref 0–44)
AST: 24 U/L (ref 15–41)
Albumin: 4.4 g/dL (ref 3.5–5.0)
Alkaline Phosphatase: 50 U/L (ref 38–126)
Anion gap: 9 (ref 5–15)
BUN: 26 mg/dL — ABNORMAL HIGH (ref 8–23)
CO2: 21 mmol/L — ABNORMAL LOW (ref 22–32)
Calcium: 9.3 mg/dL (ref 8.9–10.3)
Chloride: 102 mmol/L (ref 98–111)
Creatinine: 1.83 mg/dL — ABNORMAL HIGH (ref 0.61–1.24)
GFR, Estimated: 37 mL/min — ABNORMAL LOW (ref >60)
Glucose, Bld: 146 mg/dL — ABNORMAL HIGH (ref 70–99)
Potassium: 3.7 mmol/L (ref 3.5–5.1)
Sodium: 132 mmol/L — ABNORMAL LOW (ref 135–145)
Total Bilirubin: 0.3 mg/dL (ref 0.3–1.2)
Total Protein: 7.5 g/dL (ref 6.5–8.1)

## 2023-05-02 LAB — CBC (CANCER CENTER ONLY)
HCT: 29.7 % — ABNORMAL LOW (ref 39.0–52.0)
Hemoglobin: 9.2 g/dL — ABNORMAL LOW (ref 13.0–17.0)
MCH: 26.3 pg (ref 26.0–34.0)
MCHC: 31 g/dL (ref 30.0–36.0)
MCV: 84.9 fL (ref 80.0–100.0)
Platelet Count: 385 10*3/uL (ref 150–400)
RBC: 3.5 MIL/uL — ABNORMAL LOW (ref 4.22–5.81)
RDW: 13.3 % (ref 11.5–15.5)
WBC Count: 7.3 10*3/uL (ref 4.0–10.5)
nRBC: 0 % (ref 0.0–0.2)

## 2023-05-02 LAB — IRON AND TIBC
Iron: 38 ug/dL — ABNORMAL LOW (ref 45–182)
Saturation Ratios: 8 % — ABNORMAL LOW (ref 17.9–39.5)
TIBC: 462 ug/dL — ABNORMAL HIGH (ref 250–450)
UIBC: 424 ug/dL

## 2023-05-02 LAB — FERRITIN: Ferritin: 8 ng/mL — ABNORMAL LOW (ref 24–336)

## 2023-05-02 MED ORDER — APIXABAN 2.5 MG PO TABS: 2.5000 mg | ORAL_TABLET | Freq: Two times a day (BID) | ORAL | 3 refills | Status: DC

## 2023-05-02 NOTE — Progress Notes (Signed)
Treated for chest pain [facility], CT scan and EKG.  SOB with exertion.

## 2023-05-02 NOTE — Assessment & Plan Note (Addendum)
#   JAN 7th, 2024-Unprovoked- Acute pulmonary embolus within segmental branch of the right upper lobe pulmonary artery and segmental branch of the left lower lobe pulmonary artery. On Eliquis; CTA SEP 2024- NO acute PE. Currently on eliquis 5 mg BID  # in general, I would recommend anticoagulation for 12 months.  However patient is also on apsrin + plavix-worsening anemia [see below]. Will reduce dose to 2.5 mg BID x with plans for anticoagulation for total of 1 year.  # Mild anemia Hb 9-10- ? Sec to CKD-/IDA- on  gentle iron [iron biglycinate; 28 mg ] 1 pill a day-however hemoglobin trending down.  Discussed the potential acute infusion reactions with IV iron; which are quite rare.  Patient understands the risk; will proceed with infusions.  As discussed below recommend coming off Plavix at this time.  Might also need GI evaluation down the line. [Colonoscopy>> many years ago]  # Cerebellar stroke- JAN 2024 [facility]; Dr.Shah- AUG 2023]- S/p right carotid stenting- [april 2024; Dr.Schneir]; Also- noted to have Severe, bilateral basilar artery occlusion, occluded left vertebral artery, moderate right vertebral artery stenosis- waiting -has appointment with [facility] Arlyss Queen at [facility] [oct 2024]. On apsrin + plavix--?  Sec to possible Chronic GI bleed.  Discussed with Dr. Leretha Pol coming off Plavix at this time.   # Bil LE swelling/edema- recommend continue lasix/ and bil LE stockings- stable.   # Poorly controlled BP-better today. Stable.   # CKD [Dr.Kolluru]- III - recommend avoiding nephrotoxic NSAIDs-Advil/Motrin- stable-   # DISPOSITION: #  venofer- weekly x3; start next week # follow up in 1 month- MD; labs- cbc/possible venofer- Dr.B

## 2023-05-02 NOTE — Telephone Encounter (Signed)
Per secure chat with Dr. Donneta Romberg: M/A- please inform patient to stop taking Plavix; continue aspirin; and Eliquis 2.5 mg as ordered.    Pt notified.

## 2023-05-02 NOTE — Progress Notes (Signed)
[facility] CONSULT NOTE  Patient Care Team: Berniece Salines, FNP as PCP - General (Nurse Practitioner) Lamar Blinks, MD as Consulting Physician (Cardiology) Lorain Childes, MD as Consulting Physician (Nephrology) Sondra Come, MD as Consulting Physician (Urology) Earna Coder, MD as Consulting Physician (Oncology)  CHIEF COMPLAINTS/PURPOSE OF CONSULTATION: DVT/PE  # IMPRESSION: 1. Exam is positive for acute pulmonary embolus within segmental branch of the right upper lobe pulmonary artery and segmental branch of the left lower lobe pulmonary artery. 2. Mild mosaic attenuation of the lungs which may reflect small airways disease. 3. Multiple complicated renal cysts, reference abdominal MRI with follow-up recommendations 07/09/2021. 4. Coronary artery calcifications noted. 5. 5 mm right upper lobe pulmonary nodule is stable from 2019 and is considered benign. 6.  Aortic Atherosclerosis (ICD10-I70.0).     Electronically Signed   By: Signa Kell M.D.   On: 08/25/2022 08:57 Oncology History   No history exists.    HISTORY OF PRESENTING ILLNESS: Patient ambulating-with assistance/walker.  Accompanied by wife.   Jared Cline 77 y.o.  male history of poorly controlled blood pressure; history of stroke; status post carotid stenting [on asprin Plavix; dr.Schnier];  chronic kidney disease and Hx of PE - unprovoked on eliquis 5mg  BID is here for a follow up.   In the interim patient was evaluated in the emergency room with a CTA for ongoing chest pain.  No PE.  Chest pain resolved.  Patient admits to increasing fatigue.  Also shortness of breath on exertion.  Otherwise denies any blood in stools or black-colored stools.  No falls.  Review of Systems  Constitutional:  Positive for malaise/fatigue. Negative for chills, diaphoresis, fever and weight loss.  HENT:  Negative for nosebleeds and sore throat.   Eyes:  Negative for double vision.   Respiratory:  Positive for shortness of breath. Negative for cough, hemoptysis, sputum production and wheezing.   Cardiovascular:  Negative for chest pain, palpitations, orthopnea and leg swelling.  Gastrointestinal:  Negative for abdominal pain, blood in stool, constipation, diarrhea, heartburn, melena, nausea and vomiting.  Genitourinary:  Negative for dysuria, frequency and urgency.  Musculoskeletal:  Positive for back pain and joint pain.  Skin: Negative.  Negative for itching and rash.  Neurological:  Positive for weakness. Negative for dizziness, tingling, focal weakness and headaches.  Endo/Heme/Allergies:  Does not bruise/bleed easily.  Psychiatric/Behavioral:  Negative for depression. The patient is not nervous/anxious and does not have insomnia.      MEDICAL HISTORY:  Past Medical History:  Diagnosis Date   BPH (benign prostatic hypertrophy)    Bradycardia    evaluated by Dr. Gwen Pounds   Cancer [facility])    prostate   Carpal tunnel syndrome, left    left arm   CKD (chronic kidney disease) stage 2, GFR 60-89 ml/min    Diverticulitis    Gastritis 2017   History of prostate cancer    HNP (herniated nucleus pulposus), lumbar    Hyperlipidemia    Hypertension    IBS (irritable bowel syndrome)    Impotence    Multiple lung nodules    nonspecific, largest 5mm in Jan 2016; scanned March 2017; rescan 3-6 months later   OA (osteoarthritis) of knee    right   Renal mass    evaluated; followed by urologist   Sleep apnea    on CPAP   Stroke (HCC)    Torn meniscus    x 2; right knee    SURGICAL HISTORY: Past Surgical  History:  Procedure Laterality Date   abdominal ultrasound  Oct 2015   Negative for eval for AAA   CAROTID PTA/STENT INTERVENTION Right 11/26/2022   Procedure: CAROTID PTA/STENT INTERVENTION;  Surgeon: Renford Dills, MD;  Location: [facility] INVASIVE CV LAB;  Service: Cardiovascular;  Laterality: Right;   COLONOSCOPY     KNEE ARTHROSCOPY Right    x 2   LUMBAR  LAMINECTOMY     PROSTATECTOMY     robotic    SOCIAL HISTORY: Social History   Socioeconomic History   Marital status: Married    Spouse name: Jared Cline   Number of children: 3   Years of education: Not on file   Highest education level: Not on file  Occupational History   Occupation: retired [employer]  Tobacco Use   Smoking status: Former    Current packs/day: 0.00    Average packs/day: 0.5 packs/day for 9.0 years (4.5 ttl pk-yrs)    Types: Cigarettes    Start date: 08/19/1962    Quit date: 08/20/1971    Years since quitting: 51.7    Passive exposure: Past   Smokeless tobacco: Never  Vaping Use   Vaping status: Never Used  Substance and Sexual Activity   Alcohol use: No    Alcohol/week: 0.0 standard drinks of alcohol   Drug use: No   Sexual activity: Not Currently  Other Topics Concern   Not on file  Social History Narrative   Retired from city of [place] 2012.   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    Social Determinants of Health   Financial Resource Strain: Low Risk  (07/09/2022)   Overall Financial Resource Strain (CARDIA)    Difficulty of Paying Living Expenses: Not hard at all  Food Insecurity: No Food Insecurity (12/10/2022)   Hunger Vital Sign    Worried About Running Out of Food in the Last Year: Never true    Ran Out of Food in the Last Year: Never true  Recent Concern: Food Insecurity - Food Insecurity Present (09/30/2022)   Hunger Vital Sign    Worried About Running Out of Food in the Last Year: Never true    Ran Out of Food in the Last Year: Sometimes true  Transportation Needs: No Transportation Needs (12/10/2022)   PRAPARE - Administrator, Civil Service (Medical): No    Lack of Transportation (Non-Medical): No  Physical Activity: Inactive (07/05/2021)   Exercise Vital Sign    Days of Exercise per Week: 0 days    Minutes of Exercise per Session: 0 min  Stress: No Stress Concern Present (07/09/2022)    [employer] of Occupational Health - Occupational Stress Questionnaire    Feeling of Stress : Not at all  Social Connections: Socially Integrated (07/09/2022)   Social Connection and Isolation Panel [NHANES]    Frequency of Communication with Friends and Family: Three times a week    Frequency of Social Gatherings with Friends and Family: Once a week    Attends Religious Services: More than 4 times per year    Active Member of [employer] or Organizations: Not on file    Attends Banker Meetings: More than 4 times per year    Marital Status: Married  Catering manager Violence: Not At Risk (12/06/2022)   Humiliation, Afraid, Rape, and Kick questionnaire    Fear of Current or Ex-Partner: No    Emotionally Abused: No    Physically Abused: No    Sexually  Abused: No    FAMILY HISTORY: Family History  Problem Relation Age of Onset   Stroke Mother    Hypertension Mother    Aneurysm Father        cerebral   Hypertension Father    Heart disease Paternal Uncle    Alcohol abuse Brother    Cancer Neg Hx    COPD Neg Hx    Diabetes Neg Hx     ALLERGIES:  is allergic to flomax [tamsulosin hcl], ace inhibitors, amlodipine, doxazosin, and lipitor [atorvastatin].  MEDICATIONS:  Current Outpatient Medications  Medication Sig Dispense Refill   acetaminophen (TYLENOL) 650 MG CR tablet Take 2 tablets (1,300 mg total) by mouth every 12 (twelve) hours as needed (Mild pain).     apixaban (ELIQUIS) 2.5 MG TABS tablet Take 1 tablet (2.5 mg total) by mouth 2 (two) times daily. 60 tablet 3   aspirin EC 81 MG tablet Take 1 tablet (81 mg total) by mouth daily at 6 (six) AM. Swallow whole. 30 tablet 11   cloNIDine (CATAPRES) 0.2 MG tablet TAKE 1 TABLET(0.2 MG) BY MOUTH TWICE DAILY 180 tablet 0   clopidogrel (PLAVIX) 75 MG tablet Take 1 tablet (75 mg total) by mouth daily. 30 tablet 11   cyanocobalamin (VITAMIN B12) 1000 MCG tablet Take 1,000 mcg by mouth daily.     diclofenac Sodium  (VOLTAREN) 1 % GEL Apply 2 g topically 4 (four) times daily. 400 g 0   ferrous sulfate 325 (65 FE) MG EC tablet Take 1 tablet (325 mg total) by mouth daily with breakfast. 30 tablet 3   hydrALAZINE (APRESOLINE) 10 MG tablet TAKE 1 TABLET BY MOUTH TWICE DAILY AS NEEDED. TAKE IF BLOOD PRESSURE GREATER THAN 140/90 180 tablet 0   labetalol (NORMODYNE) 100 MG tablet 100 mg 2 (two) times daily.     pravastatin (PRAVACHOL) 80 MG tablet TAKE 1 TABLET(80 MG) BY MOUTH AT BEDTIME 90 tablet 2   torsemide (DEMADEX) 20 MG tablet Take 1 tablet (20 mg total) by mouth 2 (two) times daily. 180 tablet 1   valsartan (DIOVAN) 160 MG tablet Take 1 tablet (160 mg total) by mouth daily.     cholecalciferol (VITAMIN D3) 25 MCG (1000 UNIT) tablet Take 1,000 Units by mouth daily. (Patient not taking: Reported on 05/02/2023)     CINNAMON PO Take by mouth. (Patient not taking: Reported on 05/02/2023)     GARLIC PO Take by mouth. (Patient not taking: Reported on 05/02/2023)     senna-docusate (SENOKOT-S) 8.6-50 MG tablet Take 3 tablets by mouth daily at 6 (six) AM. (Patient not taking: Reported on 05/02/2023) 90 tablet 0   No current facility-administered medications for this visit.       PHYSICAL EXAMINATION:  Vitals:   05/02/23 0944  BP: (!) 112/53  Pulse: 80  Temp: 98.6 F (37 C)  SpO2: 100%   Filed Weights   05/02/23 0944  Weight: 229 lb 9.6 oz (104.1 kg)    Physical Exam Vitals and nursing note reviewed.  HENT:     Head: Normocephalic and atraumatic.     Mouth/Throat:     Pharynx: Oropharynx is clear.  Eyes:     Extraocular Movements: Extraocular movements intact.     Pupils: Pupils are equal, round, and reactive to light.  Cardiovascular:     Rate and Rhythm: Normal rate and regular rhythm.  Pulmonary:     Comments: Decreased breath sounds bilaterally.  Abdominal:     Palpations: Abdomen is soft.  Musculoskeletal:        General: Normal range of motion.     Cervical back: Normal range of motion.   Skin:    General: Skin is warm.  Neurological:     General: No focal deficit present.     Mental Status: He is alert and oriented to person, place, and time.  Psychiatric:        Behavior: Behavior normal.        Judgment: Judgment normal.      LABORATORY DATA:  I have reviewed the data as listed Lab Results  Component Value Date   WBC 7.3 05/02/2023   HGB 9.2 (L) 05/02/2023   HCT 29.7 (L) 05/02/2023   MCV 84.9 05/02/2023   PLT 385 05/02/2023   Recent Labs    08/24/22 0347 08/24/22 0557 09/15/22 1006 11/26/22 0955 01/29/23 1016 03/31/23 1055 04/27/23 0521 04/30/23 1441 05/02/23 0943  NA  --    < > 132*   < > 137 138 135 136 132*  K  --    < > 3.7   < > 3.9 3.9 3.7 4.4 3.7  CL  --    < > 105   < > 103 101 98 99 102  CO2  --    < > 24   < > 24 27 26 26  21*  GLUCOSE  --    < > 97   < > 131* 140* 111* 91 146*  BUN  --    < > 12   < > 22 20 35* 24 26*  CREATININE  --    < > 1.16   < > 1.40* 1.65* 1.80* 1.83* 1.83*  CALCIUM  --    < > 9.3   < > 8.1* 10.1 9.1 9.9 9.3  GFRNONAA  --    < > >60   < > 51*  --  38*  --  37*  PROT 7.2   < > 7.1   < > 6.9 7.2  --  7.0 7.5  ALBUMIN 3.9   < > 3.8  --  4.1  --   --   --  4.4  AST 17   < > 17   < > 24 20  --  14 24  ALT 9   < > 12   < > 15 14  --  11 15  ALKPHOS 50   < > 81  --  58  --   --   --  50  BILITOT 0.9   < > 0.5   < > 0.2* 0.4  --  0.3 0.3  BILIDIR 0.3*  --   --   --   --   --   --   --   --   IBILI 0.6  --   --   --   --   --   --   --   --    < > = values in this interval not displayed.    RADIOGRAPHIC STUDIES: I have personally reviewed the radiological images as listed and agreed with the findings in the report. MR ABDOMEN WWO CONTRAST  Result Date: 04/27/2023 CLINICAL DATA:  Follow-up right renal lesion EXAM: MRI ABDOMEN WITHOUT AND WITH CONTRAST TECHNIQUE: Multiplanar multisequence MR imaging of the abdomen was performed both before and after the administration of intravenous contrast. CONTRAST:  10mL GADAVIST  GADOBUTROL 1 MMOL/ML IV SOLN COMPARISON:  CT chest abdomen pelvis, 08/24/2022, MR abdomen, 07/09/2021 FINDINGS: Lower  chest: No acute abnormality. Hepatobiliary: No solid liver abnormality is seen. No gallstones, gallbladder wall thickening, or biliary dilatation. Pancreas: Unremarkable. No pancreatic ductal dilatation or surrounding inflammatory changes. Spleen: Normal in size without significant abnormality. Adrenals/Urinary Tract: Adrenal glands are unremarkable. Brightly enhancing mass arising from the inferior pole of the right kidney measuring 1.4 x 1.4 cm, not significantly changed (series 20, image 57). Multiple additional cystic lesions throughout the kidneys, several of which are hemorrhagic or proteinaceous, others subcentimeter and not confidently characterize although generally benign and requiring no specific further follow-up or characterization. No obvious calculi or hydronephrosis. Stomach/Bowel: Stomach is within normal limits. No evidence of bowel wall thickening, distention, or inflammatory changes. Susceptibility artifact arising from the hepatic flexure of the colon, most likely from ingested material or biopsy clips. Moderate burden of stool in the colon. Vascular/Lymphatic: Aortic atherosclerosis. No enlarged abdominal lymph nodes. Other: No abdominal wall hernia or abnormality. No ascites. Musculoskeletal: No acute or significant osseous findings. IMPRESSION: 1. Brightly enhancing mass arising from the inferior pole of the right kidney measuring 1.4 x 1.4 cm, not significantly changed, consistent with a small renal cell carcinoma. As previously reported, this has not significantly changed in size over multiple prior examinations dating back to at least 2016. 2. No evidence of renal vein invasion, lymphadenopathy or metastatic disease in the abdomen. 3. Multiple additional cystic lesions throughout the kidneys of varying size some of which are hemorrhagic or proteinaceous. No specific further  follow-up or characterization is required for these benign Bosniak category I and II cysts. Aortic Atherosclerosis (ICD10-I70.0). Electronically Signed   By: Jearld Lesch M.D.   On: 04/27/2023 15:15   CT Angio Chest PE W and/or Wo Contrast  Result Date: 04/27/2023 CLINICAL DATA:  77 year old male with history of central chest pain. Evaluate for pulmonary embolism. EXAM: CT ANGIOGRAPHY CHEST WITH CONTRAST TECHNIQUE: Multidetector CT imaging of the chest was performed using the standard protocol during bolus administration of intravenous contrast. Multiplanar CT image reconstructions and MIPs were obtained to evaluate the vascular anatomy. RADIATION DOSE REDUCTION: This exam was performed according to the departmental dose-optimization program which includes automated exposure control, adjustment of the mA and/or kV according to patient size and/or use of iterative reconstruction technique. CONTRAST:  75mL OMNIPAQUE IOHEXOL 350 MG/ML SOLN COMPARISON:  Multiple priors, most recently chest CTA 08/25/2022. FINDINGS: Cardiovascular: No filling defects within the pulmonary arterial tree to suggest pulmonary embolism. Heart size is borderline enlarged, but with severe concentric left ventricular hypertrophy. There is aortic atherosclerosis, as well as atherosclerosis of the great vessels of the mediastinum and the coronary arteries, including calcified atherosclerotic plaque in the left main, left anterior descending, left circumflex and right coronary arteries. Mild thickening and calcification of the aortic valve also noted. Mediastinum/Nodes: No pathologically enlarged mediastinal or hilar lymph nodes. Esophagus is unremarkable in appearance. No axillary lymphadenopathy. Lungs/Pleura: No acute consolidative airspace disease. No pleural effusions. A few scattered pulmonary nodules are noted in the lungs bilaterally measuring 4 mm or less in size. Upper Abdomen: Aortic atherosclerosis. Multiple renal lesions of varying  degrees of complexity, better evaluated on prior abdominal MRI 04/25/2023 (see that report for full description). Musculoskeletal: There are no aggressive appearing lytic or blastic lesions noted in the visualized portions of the skeleton. Review of the MIP images confirms the above findings. IMPRESSION: 1. No evidence of pulmonary embolism. No acute findings in the thorax to account for the patient's symptoms. 2. Aortic atherosclerosis, in addition to left main and three-vessel coronary  artery disease. Assessment for potential risk factor modification, dietary therapy or pharmacologic therapy may be warranted, if clinically indicated. 3. There are calcifications of the aortic valve. Echocardiographic correlation for evaluation of potential valvular dysfunction may be warranted if clinically indicated. 4. Small pulmonary nodules measuring 4 mm or less in size in the lungs bilaterally, stable compared to prior examination dating back to 2019, considered definitively benign requiring no imaging follow-up. Aortic Atherosclerosis (ICD10-I70.0). Electronically Signed   By: Trudie Reed M.D.   On: 04/27/2023 08:19   DG Chest 2 View  Result Date: 04/27/2023 CLINICAL DATA:  77 year old male with history of chest pain and shortness of breath. EXAM: CHEST - 2 VIEW COMPARISON:  Chest x-ray 12/05/2022. FINDINGS: Lung volumes are normal. No consolidative airspace disease. No pleural effusions. No pneumothorax. No pulmonary nodule or mass noted. Pulmonary vasculature and the cardiomediastinal silhouette are within normal limits. Atherosclerosis in the thoracic aorta. IMPRESSION: 1.  No radiographic evidence of acute cardiopulmonary disease. 2. Aortic atherosclerosis Electronically Signed   By: Trudie Reed M.D.   On: 04/27/2023 07:47    ASSESSMENT & PLAN:   Acute pulmonary embolism without acute cor pulmonale (HCC) # JAN 7th, 2024-Unprovoked- Acute pulmonary embolus within segmental branch of the right upper lobe  pulmonary artery and segmental branch of the left lower lobe pulmonary artery. On Eliquis; CTA SEP 2024- NO acute PE. Currently on eliquis 5 mg BID  # in general, I would recommend anticoagulation for 12 months.  However patient is also on apsrin + plavix-worsening anemia [see below]. Will reduce dose to 2.5 mg BID x with plans for anticoagulation for total of 1 year.  # Mild anemia Hb 9-10- ? Sec to CKD-/IDA- on  gentle iron [iron biglycinate; 28 mg ] 1 pill a day-however hemoglobin trending down.  Discussed the potential acute infusion reactions with IV iron; which are quite rare.  Patient understands the risk; will proceed with infusions.  As discussed below recommend coming off Plavix at this time.  Might also need GI evaluation down the line. [Colonoscopy>> many years ago]  # Cerebellar stroke- JAN 2024 [facility]; Dr.Shah- AUG 2023]- S/p right carotid stenting- [april 2024; Dr.Schneir]; Also- noted to have Severe, bilateral basilar artery occlusion, occluded left vertebral artery, moderate right vertebral artery stenosis- waiting -has appointment with Dr Arlyss Queen at [facility] neurosurgery [oct 2024]. On apsrin + plavix--?  Sec to possible Chronic GI bleed.  Discussed with Dr. Leretha Pol coming off Plavix at this time.   # Bil LE swelling/edema- recommend continue lasix/ and bil LE stockings- stable.   # Poorly controlled BP-better today. Stable.   # CKD [Dr.Kolluru]- III - recommend avoiding nephrotoxic NSAIDs-Advil/Motrin- stable-   # DISPOSITION: #  venofer- weekly x3; start next week # follow up in 1 month- MD; labs- cbc/possible venofer- Dr.B   All questions were answered. The patient knows to call the clinic with any problems, questions or concerns.    Earna Coder, MD 05/02/2023 12:45 PM

## 2023-05-06 DIAGNOSIS — I25119 Atherosclerotic heart disease of native coronary artery with unspecified angina pectoris: Secondary | ICD-10-CM | POA: Diagnosis not present

## 2023-05-06 DIAGNOSIS — I119 Hypertensive heart disease without heart failure: Secondary | ICD-10-CM | POA: Diagnosis not present

## 2023-05-06 DIAGNOSIS — R600 Localized edema: Secondary | ICD-10-CM | POA: Diagnosis not present

## 2023-05-07 ENCOUNTER — Other Ambulatory Visit: Payer: Self-pay | Admitting: Nurse Practitioner

## 2023-05-07 ENCOUNTER — Other Ambulatory Visit: Payer: Self-pay | Admitting: Internal Medicine

## 2023-05-07 ENCOUNTER — Inpatient Hospital Stay: Payer: Medicare Other

## 2023-05-07 VITALS — BP 130/54 | HR 58 | Temp 96.8°F | Resp 19

## 2023-05-07 DIAGNOSIS — Z7901 Long term (current) use of anticoagulants: Secondary | ICD-10-CM | POA: Diagnosis not present

## 2023-05-07 DIAGNOSIS — Z87891 Personal history of nicotine dependence: Secondary | ICD-10-CM | POA: Diagnosis not present

## 2023-05-07 DIAGNOSIS — R609 Edema, unspecified: Secondary | ICD-10-CM | POA: Diagnosis not present

## 2023-05-07 DIAGNOSIS — I700 Atherosclerosis of aorta: Secondary | ICD-10-CM | POA: Diagnosis not present

## 2023-05-07 DIAGNOSIS — N183 Chronic kidney disease, stage 3 unspecified: Secondary | ICD-10-CM | POA: Diagnosis not present

## 2023-05-07 DIAGNOSIS — D509 Iron deficiency anemia, unspecified: Secondary | ICD-10-CM | POA: Diagnosis not present

## 2023-05-07 DIAGNOSIS — N281 Cyst of kidney, acquired: Secondary | ICD-10-CM | POA: Diagnosis not present

## 2023-05-07 DIAGNOSIS — I2510 Atherosclerotic heart disease of native coronary artery without angina pectoris: Secondary | ICD-10-CM | POA: Diagnosis not present

## 2023-05-07 DIAGNOSIS — D649 Anemia, unspecified: Secondary | ICD-10-CM

## 2023-05-07 DIAGNOSIS — Z8673 Personal history of transient ischemic attack (TIA), and cerebral infarction without residual deficits: Secondary | ICD-10-CM | POA: Diagnosis not present

## 2023-05-07 DIAGNOSIS — Z86711 Personal history of pulmonary embolism: Secondary | ICD-10-CM | POA: Diagnosis not present

## 2023-05-07 DIAGNOSIS — I517 Cardiomegaly: Secondary | ICD-10-CM

## 2023-05-07 DIAGNOSIS — D631 Anemia in chronic kidney disease: Secondary | ICD-10-CM | POA: Diagnosis not present

## 2023-05-07 DIAGNOSIS — R9439 Abnormal result of other cardiovascular function study: Secondary | ICD-10-CM

## 2023-05-07 MED ORDER — SODIUM CHLORIDE 0.9 % IV SOLN
Freq: Once | INTRAVENOUS | Status: AC
  Filled 2023-05-07: qty 250

## 2023-05-07 MED ORDER — SODIUM CHLORIDE 0.9 % IV SOLN
200.0000 mg | Freq: Once | INTRAVENOUS | Status: AC
  Administered 2023-05-07: 200 mg via INTRAVENOUS
  Filled 2023-05-07: qty 200

## 2023-05-07 NOTE — Patient Instructions (Signed)
Iron Sucrose Injection What is this medication? IRON SUCROSE (EYE ern SOO krose) treats low levels of iron (iron deficiency anemia) in people with kidney disease. Iron is a mineral that plays an important role in making red blood cells, which carry oxygen from your lungs to the rest of your body. This medicine may be used for other purposes; ask your health care provider or pharmacist if you have questions. COMMON BRAND NAME(S): Venofer What should I tell my care team before I take this medication? They need to know if you have any of these conditions: Anemia not caused by low iron levels Heart disease High levels of iron in the blood Kidney disease Liver disease An unusual or allergic reaction to iron, other medications, foods, dyes, or preservatives Pregnant or trying to get pregnant Breastfeeding How should I use this medication? This medication is for infusion into a vein. It is given in a hospital or clinic setting. Talk to your care team about the use of this medication in children. While this medication may be prescribed for children as young as 2 years for selected conditions, precautions do apply. Overdosage: If you think you have taken too much of this medicine contact a poison control center or emergency room at once. NOTE: This medicine is only for you. Do not share this medicine with others. What if I miss a dose? Keep appointments for follow-up doses. It is important not to miss your dose. Call your care team if you are unable to keep an appointment. What may interact with this medication? Do not take this medication with any of the following: Deferoxamine Dimercaprol Other iron products This medication may also interact with the following: Chloramphenicol Deferasirox This list may not describe all possible interactions. Give your health care provider a list of all the medicines, herbs, non-prescription drugs, or dietary supplements you use. Also tell them if you smoke,  drink alcohol, or use illegal drugs. Some items may interact with your medicine. What should I watch for while using this medication? Visit your care team regularly. Tell your care team if your symptoms do not start to get better or if they get worse. You may need blood work done while you are taking this medication. You may need to follow a special diet. Talk to your care team. Foods that contain iron include: whole grains/cereals, dried fruits, beans, or peas, leafy green vegetables, and organ meats (liver, kidney). What side effects may I notice from receiving this medication? Side effects that you should report to your care team as soon as possible: Allergic reactions--skin rash, itching, hives, swelling of the face, lips, tongue, or throat Low blood pressure--dizziness, feeling faint or lightheaded, blurry vision Shortness of breath Side effects that usually do not require medical attention (report to your care team if they continue or are bothersome): Flushing Headache Joint pain Muscle pain Nausea Pain, redness, or irritation at injection site This list may not describe all possible side effects. Call your doctor for medical advice about side effects. You may report side effects to FDA at 1-800-FDA-1088. Where should I keep my medication? This medication is given in a hospital or clinic. It will not be stored at home. NOTE: This sheet is a summary. It may not cover all possible information. If you have questions about this medicine, talk to your doctor, pharmacist, or health care provider.  2024 Elsevier/Gold Standard (2023-01-10 00:00:00)

## 2023-05-09 ENCOUNTER — Telehealth (HOSPITAL_COMMUNITY): Payer: Self-pay | Admitting: *Deleted

## 2023-05-09 MED ORDER — METOPROLOL TARTRATE 100 MG PO TABS: ORAL_TABLET | ORAL | 0 refills | Status: DC

## 2023-05-09 NOTE — Telephone Encounter (Signed)
Attempted to call patient regarding upcoming cardiac CT appointment. Left message on voicemail with name and callback number Johney Frame RN Navigator Cardiac Imaging [facility] [phone] Office  Medication sent to pharmacy on file.

## 2023-05-12 ENCOUNTER — Ambulatory Visit
Admission: RE | Admit: 2023-05-12 | Discharge: 2023-05-12 | Disposition: A | Discharge status: Home / Self Care | Payer: Medicare Other | Source: Ambulatory Visit | Attending: Nurse Practitioner | Admitting: Nurse Practitioner

## 2023-05-12 DIAGNOSIS — I2510 Atherosclerotic heart disease of native coronary artery without angina pectoris: Secondary | ICD-10-CM | POA: Diagnosis not present

## 2023-05-12 DIAGNOSIS — R9439 Abnormal result of other cardiovascular function study: Secondary | ICD-10-CM | POA: Diagnosis not present

## 2023-05-12 DIAGNOSIS — I517 Cardiomegaly: Secondary | ICD-10-CM | POA: Diagnosis not present

## 2023-05-12 LAB — POCT I-STAT CREATININE: Creatinine, Ser: 2.2 mg/dL — ABNORMAL HIGH (ref 0.61–1.24)

## 2023-05-12 MED ORDER — SODIUM CHLORIDE 0.45 % IV BOLUS: 250.0000 mL | Freq: Once | INTRAVENOUS | Status: DC

## 2023-05-12 MED ORDER — NITROGLYCERIN 0.4 MG SL SUBL
0.8000 mg | SUBLINGUAL_TABLET | Freq: Once | SUBLINGUAL | Status: AC
  Administered 2023-05-12: 0.8 mg via SUBLINGUAL
  Filled 2023-05-12: qty 25

## 2023-05-12 MED ORDER — METOPROLOL TARTRATE 5 MG/5ML IV SOLN
10.0000 mg | Freq: Once | INTRAVENOUS | Status: DC
  Filled 2023-05-12: qty 10

## 2023-05-12 MED ORDER — SODIUM CHLORIDE 0.45 % IV BOLUS: 500.0000 mL | Freq: Once | INTRAVENOUS | Status: DC

## 2023-05-12 MED ORDER — SODIUM CHLORIDE 0.9 % IV BOLUS
250.0000 mL | Freq: Once | INTRAVENOUS | Status: AC
  Administered 2023-05-12: 250 mL via INTRAVENOUS

## 2023-05-12 MED ORDER — METOPROLOL TARTRATE 5 MG/5ML IV SOLN
INTRAVENOUS | Status: AC
  Filled 2023-05-12: qty 10

## 2023-05-12 MED ORDER — IOHEXOL 350 MG/ML SOLN
80.0000 mL | Freq: Once | INTRAVENOUS | Status: AC | PRN
  Administered 2023-05-12: 60 mL via INTRAVENOUS

## 2023-05-12 NOTE — Progress Notes (Signed)

## 2023-05-13 DIAGNOSIS — I10 Essential (primary) hypertension: Secondary | ICD-10-CM | POA: Diagnosis not present

## 2023-05-13 DIAGNOSIS — I2510 Atherosclerotic heart disease of native coronary artery without angina pectoris: Secondary | ICD-10-CM | POA: Diagnosis not present

## 2023-05-13 DIAGNOSIS — I119 Hypertensive heart disease without heart failure: Secondary | ICD-10-CM | POA: Diagnosis not present

## 2023-05-13 DIAGNOSIS — I700 Atherosclerosis of aorta: Secondary | ICD-10-CM | POA: Diagnosis not present

## 2023-05-14 ENCOUNTER — Inpatient Hospital Stay: Payer: Medicare Other

## 2023-05-14 VITALS — BP 128/58 | HR 72 | Temp 98.2°F

## 2023-05-14 DIAGNOSIS — D631 Anemia in chronic kidney disease: Secondary | ICD-10-CM | POA: Diagnosis not present

## 2023-05-14 DIAGNOSIS — N281 Cyst of kidney, acquired: Secondary | ICD-10-CM | POA: Diagnosis not present

## 2023-05-14 DIAGNOSIS — D509 Iron deficiency anemia, unspecified: Secondary | ICD-10-CM | POA: Diagnosis not present

## 2023-05-14 DIAGNOSIS — Z7901 Long term (current) use of anticoagulants: Secondary | ICD-10-CM | POA: Diagnosis not present

## 2023-05-14 DIAGNOSIS — I2510 Atherosclerotic heart disease of native coronary artery without angina pectoris: Secondary | ICD-10-CM | POA: Diagnosis not present

## 2023-05-14 DIAGNOSIS — I700 Atherosclerosis of aorta: Secondary | ICD-10-CM | POA: Diagnosis not present

## 2023-05-14 DIAGNOSIS — Z87891 Personal history of nicotine dependence: Secondary | ICD-10-CM | POA: Diagnosis not present

## 2023-05-14 DIAGNOSIS — Z8673 Personal history of transient ischemic attack (TIA), and cerebral infarction without residual deficits: Secondary | ICD-10-CM | POA: Diagnosis not present

## 2023-05-14 DIAGNOSIS — R609 Edema, unspecified: Secondary | ICD-10-CM | POA: Diagnosis not present

## 2023-05-14 DIAGNOSIS — N183 Chronic kidney disease, stage 3 unspecified: Secondary | ICD-10-CM | POA: Diagnosis not present

## 2023-05-14 DIAGNOSIS — Z86711 Personal history of pulmonary embolism: Secondary | ICD-10-CM | POA: Diagnosis not present

## 2023-05-14 DIAGNOSIS — D649 Anemia, unspecified: Secondary | ICD-10-CM

## 2023-05-14 MED ORDER — SODIUM CHLORIDE 0.9 % IV SOLN
200.0000 mg | Freq: Once | INTRAVENOUS | Status: AC
  Administered 2023-05-14: 200 mg via INTRAVENOUS
  Filled 2023-05-14: qty 200

## 2023-05-14 MED ORDER — SODIUM CHLORIDE 0.9 % IV SOLN
Freq: Once | INTRAVENOUS | Status: AC
  Filled 2023-05-14: qty 250

## 2023-05-20 ENCOUNTER — Ambulatory Visit
Admission: RE | Admit: 2023-05-20 | Discharge: 2023-05-20 | Disposition: A | Discharge status: Home / Self Care | Payer: Medicare Other | Source: Ambulatory Visit | Attending: Nurse Practitioner | Admitting: Nurse Practitioner

## 2023-05-20 DIAGNOSIS — I25119 Atherosclerotic heart disease of native coronary artery with unspecified angina pectoris: Secondary | ICD-10-CM | POA: Diagnosis not present

## 2023-05-20 DIAGNOSIS — I700 Atherosclerosis of aorta: Secondary | ICD-10-CM | POA: Insufficient documentation

## 2023-05-20 DIAGNOSIS — Z8673 Personal history of transient ischemic attack (TIA), and cerebral infarction without residual deficits: Secondary | ICD-10-CM | POA: Diagnosis not present

## 2023-05-20 DIAGNOSIS — I1A0 Resistant hypertension: Secondary | ICD-10-CM | POA: Insufficient documentation

## 2023-05-20 DIAGNOSIS — I6523 Occlusion and stenosis of bilateral carotid arteries: Secondary | ICD-10-CM | POA: Insufficient documentation

## 2023-05-20 DIAGNOSIS — N281 Cyst of kidney, acquired: Secondary | ICD-10-CM | POA: Insufficient documentation

## 2023-05-20 DIAGNOSIS — Z86711 Personal history of pulmonary embolism: Secondary | ICD-10-CM | POA: Diagnosis not present

## 2023-05-20 DIAGNOSIS — R600 Localized edema: Secondary | ICD-10-CM | POA: Insufficient documentation

## 2023-05-21 ENCOUNTER — Inpatient Hospital Stay: Payer: Medicare Other

## 2023-05-22 ENCOUNTER — Inpatient Hospital Stay: Payer: Medicare Other | Attending: Internal Medicine

## 2023-05-22 VITALS — BP 146/57 | HR 66 | Temp 97.8°F | Resp 18

## 2023-05-22 DIAGNOSIS — D649 Anemia, unspecified: Secondary | ICD-10-CM | POA: Diagnosis not present

## 2023-05-22 MED ORDER — SODIUM CHLORIDE 0.9 % IV SOLN
200.0000 mg | Freq: Once | INTRAVENOUS | Status: AC
  Administered 2023-05-22: 200 mg via INTRAVENOUS
  Filled 2023-05-22: qty 200

## 2023-05-22 MED ORDER — SODIUM CHLORIDE 0.9 % IV SOLN
Freq: Once | INTRAVENOUS | Status: AC
  Filled 2023-05-22: qty 250

## 2023-05-26 ENCOUNTER — Telehealth: Payer: Self-pay

## 2023-05-26 DIAGNOSIS — H903 Sensorineural hearing loss, bilateral: Secondary | ICD-10-CM | POA: Diagnosis not present

## 2023-05-26 DIAGNOSIS — R42 Dizziness and giddiness: Secondary | ICD-10-CM | POA: Diagnosis not present

## 2023-05-26 NOTE — Telephone Encounter (Signed)
Transition Care Management Unsuccessful Follow-up Telephone Call  Date of discharge and from where:  04/27/2023 [facility]  Attempts:  2nd Attempt  Reason for unsuccessful TCM follow-up call:  No answer/busy  Angelus Hoopes [facility]  [facility], [facility] Guide Direct Dial: [phone]  Website: [email or web address]

## 2023-05-26 NOTE — Telephone Encounter (Signed)
Transition Care Management Unsuccessful Follow-up Telephone Call  Date of discharge and from where:  04/27/2023 [facility]  Attempts:  1st Attempt  Reason for unsuccessful TCM follow-up call:  Voice mail full  [name] [facility]  [facility], [facility] Guide Direct Dial: [phone]  Website: [email or web address]

## 2023-05-30 ENCOUNTER — Other Ambulatory Visit: Payer: Self-pay | Admitting: Nurse Practitioner

## 2023-05-30 DIAGNOSIS — R600 Localized edema: Secondary | ICD-10-CM

## 2023-05-30 DIAGNOSIS — G4733 Obstructive sleep apnea (adult) (pediatric): Secondary | ICD-10-CM | POA: Diagnosis not present

## 2023-05-30 NOTE — Telephone Encounter (Signed)
D/C 03/21/23. Requested Prescriptions  Refused Prescriptions Disp Refills   furosemide (LASIX) 20 MG tablet [Pharmacy Med Name: FUROSEMIDE 20MG  TABLETS] 180 tablet 0    Sig: TAKE 1 TABLET BY MOUTH EVERY MORNING AND 1 TABLET BY MOUTH EVERY AFTERNOON AS NEEDED FOR SWELLING IN THE LEGS     Cardiovascular:  Diuretics - Loop Failed - 05/30/2023 12:24 PM      Failed - Na in normal range and within 180 days    Sodium  Date Value Ref Range Status  05/02/2023 132 (L) 135 - 145 mmol/L Final  10/19/2015 135 134 - 144 mmol/L Final  08/25/2014 133 (L) 136 - 145 mmol/L Final         Failed - Cr in normal range and within 180 days    Creatinine  Date Value Ref Range Status  05/02/2023 1.83 (H) 0.61 - 1.24 mg/dL Final   Creat  Date Value Ref Range Status  04/30/2023 1.83 (H) 0.70 - 1.28 mg/dL Final   Creatinine, Ser  Date Value Ref Range Status  05/12/2023 2.20 (H) 0.61 - 1.24 mg/dL Final   Creatinine, Urine  Date Value Ref Range Status  05/02/2021 128 20 - 320 mg/dL Final         Failed - Mg Level in normal range and within 180 days    Magnesium  Date Value Ref Range Status  08/28/2022 2.2 1.7 - 2.4 mg/dL Final    Comment:    Performed at [facility], [address] [place], [place] [address]         Failed - Last BP in normal range    BP Readings from Last 1 Encounters:  05/22/23 (!) 146/57         Passed - K in normal range and within 180 days    Potassium  Date Value Ref Range Status  05/02/2023 3.7 3.5 - 5.1 mmol/L Final  08/25/2014 4.1 3.5 - 5.1 mmol/L Final         Passed - Ca in normal range and within 180 days    Calcium  Date Value Ref Range Status  05/02/2023 9.3 8.9 - 10.3 mg/dL Final   Calcium, Total  Date Value Ref Range Status  08/25/2014 9.1 8.5 - 10.1 mg/dL Final         Passed - Cl in normal range and within 180 days    Chloride  Date Value Ref Range Status  05/02/2023 102 98 - 111 mmol/L Final  08/25/2014 100 98 - 107 mmol/L Final          Passed - Valid encounter within last 6 months    Recent Outpatient Visits           1 month ago Chest pain, unspecified type   [facility] Berniece Salines, FNP   2 months ago Lower extremity edema   [facility] Berniece Salines, FNP   2 months ago Lower extremity edema   [facility] Margarita Mail, DO   4 months ago Primary hypertension   [facility] Berniece Salines, FNP   4 months ago Periumbilical abdominal pain   [facility] Mecum, Oswaldo Conroy, PA-C       Future Appointments             In 5 months Richardo Hanks, Laurette Schimke, MD [facility] [place]

## 2023-06-04 ENCOUNTER — Inpatient Hospital Stay (HOSPITAL_BASED_OUTPATIENT_CLINIC_OR_DEPARTMENT_OTHER): Payer: Medicare Other | Admitting: Internal Medicine

## 2023-06-04 ENCOUNTER — Encounter: Payer: Self-pay | Admitting: Internal Medicine

## 2023-06-04 ENCOUNTER — Inpatient Hospital Stay: Payer: Medicare Other

## 2023-06-04 VITALS — BP 140/55 | HR 83 | Temp 97.4°F | Ht 72.0 in | Wt 234.8 lb

## 2023-06-04 VITALS — BP 159/61 | HR 66 | Temp 97.7°F | Resp 18

## 2023-06-04 DIAGNOSIS — D649 Anemia, unspecified: Secondary | ICD-10-CM

## 2023-06-04 DIAGNOSIS — I2699 Other pulmonary embolism without acute cor pulmonale: Secondary | ICD-10-CM | POA: Diagnosis not present

## 2023-06-04 LAB — CBC WITH DIFFERENTIAL (CANCER CENTER ONLY)
Abs Immature Granulocytes: 0.02 10*3/uL (ref 0.00–0.07)
Basophils Absolute: 0 10*3/uL (ref 0.0–0.1)
Basophils Relative: 1 %
Eosinophils Absolute: 0.1 10*3/uL (ref 0.0–0.5)
Eosinophils Relative: 2 %
HCT: 30.5 % — ABNORMAL LOW (ref 39.0–52.0)
Hemoglobin: 9.5 g/dL — ABNORMAL LOW (ref 13.0–17.0)
Immature Granulocytes: 0 %
Lymphocytes Relative: 29 %
Lymphs Abs: 1.6 10*3/uL (ref 0.7–4.0)
MCH: 26.6 pg (ref 26.0–34.0)
MCHC: 31.1 g/dL (ref 30.0–36.0)
MCV: 85.4 fL (ref 80.0–100.0)
Monocytes Absolute: 0.5 10*3/uL (ref 0.1–1.0)
Monocytes Relative: 9 %
Neutro Abs: 3.3 10*3/uL (ref 1.7–7.7)
Neutrophils Relative %: 59 %
Platelet Count: 283 10*3/uL (ref 150–400)
RBC: 3.57 MIL/uL — ABNORMAL LOW (ref 4.22–5.81)
RDW: 14.1 % (ref 11.5–15.5)
WBC Count: 5.5 10*3/uL (ref 4.0–10.5)
nRBC: 0 % (ref 0.0–0.2)

## 2023-06-04 MED ORDER — SODIUM CHLORIDE 0.9 % IV SOLN
200.0000 mg | Freq: Once | INTRAVENOUS | Status: AC
  Administered 2023-06-04: 200 mg via INTRAVENOUS
  Filled 2023-06-04: qty 200

## 2023-06-04 MED ORDER — SODIUM CHLORIDE 0.9 % IV SOLN
Freq: Once | INTRAVENOUS | Status: AC
  Filled 2023-06-04: qty 250

## 2023-06-04 NOTE — Progress Notes (Signed)
Fatigue/weakness:  no Dyspena: yes Light headedness: yes when bending over Blood in stool: no  CT coronary 05/12/23, u/s renal 05/20/23.  SOB since stroke, no worse.  C/o constipation with iron pills. Is there something else he can take? Taking something otc, unsure of name.

## 2023-06-04 NOTE — Assessment & Plan Note (Addendum)
#   JAN 7th, 2024-Unprovoked- Acute pulmonary embolus within segmental branch of the right upper lobe pulmonary artery and segmental branch of the left lower lobe pulmonary artery. On Eliquis; CTA SEP 2024- NO acute PE. Eliquis 5 mg BID x39m; Currently on eliquis 2.5 mg BID; I would recommend anticoagulation for 12 months.    # Mild anemia Hb 9-10- ? Sec to CKD-/IDA- OFF  gentle iron [iron biglycinate; 28 mg- constipation]. Continue IV venofer-  Might also need GI evaluation down the line. Will make a referral to [facility] re: anemia  # Cerebellar stroke- JAN 2024 [facility]; Dr.Shah- AUG 2023]- S/p right carotid stenting- [april 2024; Dr.Schneir]; Also- noted to have Severe, bilateral basilar artery occlusion, occluded left vertebral artery, moderate right vertebral artery stenosis-[ Dr Arlyss Queen at [facility] neurosurgery [oct 2024]- conservative management. On Asprin Sec to possible Chronic GI bleed-  # Bil LE swelling/edema- recommend continue lasix/ and bil LE stockings- stable.   # Poorly controlled BP-better today. Stable.   # CKD [Dr.Kolluru]- III - recommend avoiding nephrotoxic NSAIDs-Advil/Motrin- Stable.   # DISPOSITION: # referral to [facility] re: anemia #  venofer today. #  weekly other week x3  # follow up in 2  month- MD; labs- cbc/bmp; iron studies, ferritin- possible venofer- Dr.B

## 2023-06-04 NOTE — Progress Notes (Signed)
[facility] CONSULT NOTE  Patient Care Team: Berniece Salines, FNP as PCP - General (Nurse Practitioner) Lamar Blinks, MD as Consulting Physician (Cardiology) Lorain Childes, MD as Consulting Physician (Nephrology) Sondra Come, MD as Consulting Physician (Urology) Earna Coder, MD as Consulting Physician (Oncology)  CHIEF COMPLAINTS/PURPOSE OF CONSULTATION: DVT/PE  # IMPRESSION: 1. Exam is positive for acute pulmonary embolus within segmental branch of the right upper lobe pulmonary artery and segmental branch of the left lower lobe pulmonary artery. 2. Mild mosaic attenuation of the lungs which may reflect small airways disease. 3. Multiple complicated renal cysts, reference abdominal MRI with follow-up recommendations 07/09/2021. 4. Coronary artery calcifications noted. 5. 5 mm right upper lobe pulmonary nodule is stable from 2019 and is considered benign. 6.  Aortic Atherosclerosis (ICD10-I70.0).     Electronically Signed   By: Signa Kell M.D.   On: 08/25/2022 08:57 Oncology History   No history exists.    HISTORY OF PRESENTING ILLNESS: Patient ambulating-with assistance/walker.  Accompanied by wife.   Jared Cline 77 y.o.  male history of poorly controlled blood pressure; history of stroke; status post carotid stenting [on asprin ONLY; dr.Schnier];  chronic kidney disease and Hx of PE [JAN 2024] - unprovoked on eliquis 2.5mg  BID [since SEP 2024] is here for a follow up.   Patient was evaluated for his vertebral artery occlusion.  Recommended conservative measures.  Not taking iron pills because of constipation.   Denies any blood in stools or black-colored stools.  However patient does admits to increasing fatigue.  Also shortness of breath on exertion.  No falls.    Review of Systems  Constitutional:  Positive for malaise/fatigue. Negative for chills, diaphoresis, fever and weight loss.  HENT:  Negative for nosebleeds and sore  throat.   Eyes:  Negative for double vision.  Respiratory:  Positive for shortness of breath. Negative for cough, hemoptysis, sputum production and wheezing.   Cardiovascular:  Negative for chest pain, palpitations, orthopnea and leg swelling.  Gastrointestinal:  Negative for abdominal pain, blood in stool, constipation, diarrhea, heartburn, melena, nausea and vomiting.  Genitourinary:  Negative for dysuria, frequency and urgency.  Musculoskeletal:  Positive for back pain and joint pain.  Skin: Negative.  Negative for itching and rash.  Neurological:  Positive for weakness. Negative for dizziness, tingling, focal weakness and headaches.  Endo/Heme/Allergies:  Does not bruise/bleed easily.  Psychiatric/Behavioral:  Negative for depression. The patient is not nervous/anxious and does not have insomnia.      MEDICAL HISTORY:  Past Medical History:  Diagnosis Date   BPH (benign prostatic hypertrophy)    Bradycardia    evaluated by Dr. Gwen Pounds   Cancer [facility])    prostate   Carpal tunnel syndrome, left    left arm   CKD (chronic kidney disease) stage 2, GFR 60-89 ml/min    Diverticulitis    Gastritis 2017   History of prostate cancer    HNP (herniated nucleus pulposus), lumbar    Hyperlipidemia    Hypertension    IBS (irritable bowel syndrome)    Impotence    Multiple lung nodules    nonspecific, largest 5mm in Jan 2016; scanned March 2017; rescan 3-6 months later   OA (osteoarthritis) of knee    right   Renal mass    evaluated; followed by urologist   Sleep apnea    on CPAP   Stroke (HCC)    Torn meniscus    x 2; right knee  SURGICAL HISTORY: Past Surgical History:  Procedure Laterality Date   abdominal ultrasound  Oct 2015   Negative for eval for AAA   CAROTID PTA/STENT INTERVENTION Right 11/26/2022   Procedure: CAROTID PTA/STENT INTERVENTION;  Surgeon: Renford Dills, MD;  Location: [facility] INVASIVE CV LAB;  Service: Cardiovascular;  Laterality: Right;   COLONOSCOPY      KNEE ARTHROSCOPY Right    x 2   LUMBAR LAMINECTOMY     PROSTATECTOMY     robotic    SOCIAL HISTORY: Social History   Socioeconomic History   Marital status: Married    Spouse name: Jared Cline   Number of children: 3   Years of education: Not on file   Highest education level: Not on file  Occupational History   Occupation: retired city [employer]  Tobacco Use   Smoking status: Former    Current packs/day: 0.00    Average packs/day: 0.5 packs/day for 9.0 years (4.5 ttl pk-yrs)    Types: Cigarettes    Start date: 08/19/1962    Quit date: 08/20/1971    Years since quitting: 51.8    Passive exposure: Past   Smokeless tobacco: Never  Vaping Use   Vaping status: Never Used  Substance and Sexual Activity   Alcohol use: No    Alcohol/week: 0.0 standard drinks of alcohol   Drug use: No   Sexual activity: Not Currently  Other Topics Concern   Not on file  Social History Narrative   Retired from city of [place] 2012.   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    Social Determinants of Health   Financial Resource Strain: Low Risk  (07/09/2022)   Overall Financial Resource Strain (CARDIA)    Difficulty of Paying Living Expenses: Not hard at all  Food Insecurity: No Food Insecurity (12/10/2022)   Hunger Vital Sign    Worried About Running Out of Food in the Last Year: Never true    Ran Out of Food in the Last Year: Never true  Recent Concern: Food Insecurity - Food Insecurity Present (09/30/2022)   Hunger Vital Sign    Worried About Running Out of Food in the Last Year: Never true    Ran Out of Food in the Last Year: Sometimes true  Transportation Needs: No Transportation Needs (12/10/2022)   PRAPARE - Administrator, Civil Service (Medical): No    Lack of Transportation (Non-Medical): No  Physical Activity: Inactive (07/05/2021)   Exercise Vital Sign    Days of Exercise per Week: 0 days    Minutes of Exercise per Session: 0 min  Stress: No  Stress Concern Present (07/09/2022)   [employer] of Occupational Health - Occupational Stress Questionnaire    Feeling of Stress : Not at all  Social Connections: Socially Integrated (07/09/2022)   Social Connection and Isolation Panel [NHANES]    Frequency of Communication with Friends and Family: Three times a week    Frequency of Social Gatherings with Friends and Family: Once a week    Attends Religious Services: More than 4 times per year    Active Member of [employer] or Organizations: Not on file    Attends Banker Meetings: More than 4 times per year    Marital Status: Married  Catering manager Violence: Not At Risk (12/06/2022)   Humiliation, Afraid, Rape, and Kick questionnaire    Fear of Current or Ex-Partner: No    Emotionally Abused: No    Physically Abused: No  Sexually Abused: No    FAMILY HISTORY: Family History  Problem Relation Age of Onset   Stroke Mother    Hypertension Mother    Aneurysm Father        cerebral   Hypertension Father    Heart disease Paternal Uncle    Alcohol abuse Brother    Cancer Neg Hx    COPD Neg Hx    Diabetes Neg Hx     ALLERGIES:  is allergic to flomax [tamsulosin hcl], ace inhibitors, amlodipine, doxazosin, and lipitor [atorvastatin].  MEDICATIONS:  Current Outpatient Medications  Medication Sig Dispense Refill   acetaminophen (TYLENOL) 650 MG CR tablet Take 2 tablets (1,300 mg total) by mouth every 12 (twelve) hours as needed (Mild pain).     apixaban (ELIQUIS) 2.5 MG TABS tablet Take 1 tablet (2.5 mg total) by mouth 2 (two) times daily. 60 tablet 3   aspirin EC 81 MG tablet Take 1 tablet (81 mg total) by mouth daily at 6 (six) AM. Swallow whole. 30 tablet 11   cloNIDine (CATAPRES) 0.2 MG tablet TAKE 1 TABLET(0.2 MG) BY MOUTH TWICE DAILY 180 tablet 0   clopidogrel (PLAVIX) 75 MG tablet Take 1 tablet (75 mg total) by mouth daily. 30 tablet 11   cyanocobalamin (VITAMIN B12) 1000 MCG tablet Take 1,000 mcg by mouth  daily.     diclofenac Sodium (VOLTAREN) 1 % GEL Apply 2 g topically 4 (four) times daily. 400 g 0   ferrous sulfate 325 (65 FE) MG EC tablet Take 1 tablet (325 mg total) by mouth daily with breakfast. 30 tablet 3   hydrALAZINE (APRESOLINE) 10 MG tablet TAKE 1 TABLET BY MOUTH TWICE DAILY AS NEEDED. TAKE IF BLOOD PRESSURE GREATER THAN 140/90 180 tablet 0   labetalol (NORMODYNE) 100 MG tablet 100 mg 2 (two) times daily.     metoprolol tartrate (LOPRESSOR) 100 MG tablet Take 1 tablet (100mg ) TWO hours prior to CT scan 1 tablet 0   pravastatin (PRAVACHOL) 80 MG tablet TAKE 1 TABLET(80 MG) BY MOUTH AT BEDTIME 90 tablet 2   torsemide (DEMADEX) 20 MG tablet Take 1 tablet (20 mg total) by mouth 2 (two) times daily. 180 tablet 1   valsartan (DIOVAN) 160 MG tablet Take 1 tablet (160 mg total) by mouth daily.     No current facility-administered medications for this visit.   Facility-Administered Medications Ordered in Other Visits  Medication Dose Route Frequency Provider Last Rate Last Admin   iron sucrose (VENOFER) 200 mg in sodium chloride 0.9 % 100 mL IVPB  200 mg Intravenous Once Earna Coder, MD           PHYSICAL EXAMINATION:  Vitals:   06/04/23 1257 06/04/23 1310  BP: (!) 162/56 (!) 140/55  Pulse: 83   Temp: (!) 97.4 F (36.3 C)   SpO2: 100%    Filed Weights   06/04/23 1257  Weight: 234 lb 12.8 oz (106.5 kg)    Physical Exam Vitals and nursing note reviewed.  HENT:     Head: Normocephalic and atraumatic.     Mouth/Throat:     Pharynx: Oropharynx is clear.  Eyes:     Extraocular Movements: Extraocular movements intact.     Pupils: Pupils are equal, round, and reactive to light.  Cardiovascular:     Rate and Rhythm: Normal rate and regular rhythm.  Pulmonary:     Comments: Decreased breath sounds bilaterally.  Abdominal:     Palpations: Abdomen is soft.  Musculoskeletal:  General: Normal range of motion.     Cervical back: Normal range of motion.  Skin:     General: Skin is warm.  Neurological:     General: No focal deficit present.     Mental Status: He is alert and oriented to person, place, and time.  Psychiatric:        Behavior: Behavior normal.        Judgment: Judgment normal.      LABORATORY DATA:  I have reviewed the data as listed Lab Results  Component Value Date   WBC 5.5 06/04/2023   HGB 9.5 (L) 06/04/2023   HCT 30.5 (L) 06/04/2023   MCV 85.4 06/04/2023   PLT 283 06/04/2023   Recent Labs    08/24/22 0347 08/24/22 0557 09/15/22 1006 11/26/22 0955 01/29/23 1016 03/31/23 1055 04/27/23 0521 04/30/23 1441 05/02/23 0943 05/12/23 1449  NA  --    < > 132*   < > 137 138 135 136 132*  --   K  --    < > 3.7   < > 3.9 3.9 3.7 4.4 3.7  --   CL  --    < > 105   < > 103 101 98 99 102  --   CO2  --    < > 24   < > 24 27 26 26  21*  --   GLUCOSE  --    < > 97   < > 131* 140* 111* 91 146*  --   BUN  --    < > 12   < > 22 20 35* 24 26*  --   CREATININE  --    < > 1.16   < > 1.40* 1.65* 1.80* 1.83* 1.83* 2.20*  CALCIUM  --    < > 9.3   < > 8.1* 10.1 9.1 9.9 9.3  --   GFRNONAA  --    < > >60   < > 51*  --  38*  --  37*  --   PROT 7.2   < > 7.1   < > 6.9 7.2  --  7.0 7.5  --   ALBUMIN 3.9   < > 3.8  --  4.1  --   --   --  4.4  --   AST 17   < > 17   < > 24 20  --  14 24  --   ALT 9   < > 12   < > 15 14  --  11 15  --   ALKPHOS 50   < > 81  --  58  --   --   --  50  --   BILITOT 0.9   < > 0.5   < > 0.2* 0.4  --  0.3 0.3  --   BILIDIR 0.3*  --   --   --   --   --   --   --   --   --   IBILI 0.6  --   --   --   --   --   --   --   --   --    < > = values in this interval not displayed.    RADIOGRAPHIC STUDIES: I have personally reviewed the radiological images as listed and agreed with the findings in the report. CT CORONARY MORPH W/CTA COR W/SCORE W/CA W/CM &/OR WO/CM  Addendum Date: 05/31/2023   ADDENDUM REPORT: 05/31/2023 15:16  EXAM: OVER-READ INTERPRETATION  CT CHEST The following report is an over-read performed by  radiologist Dr. Narda Rutherford of [facility] on 05/31/2023. This over-read does not include interpretation of cardiac or coronary anatomy or pathology. The coronary CTA interpretation by the cardiologist is attached. COMPARISON:  Chest CTA 04/27/2023 FINDINGS: Vascular: Aortic atherosclerosis. The included aorta is normal in caliber. Mediastinum/nodes: No adenopathy or mass.  Tiny hiatal hernia. Lungs: No focal airspace disease. Unchanged 3 mm perifissural right middle lobe nodule series 13, image 16. This is fully assessed on chest CTA last month. No pleural fluid. The included airways are patent. Upper abdomen: No acute or unexpected findings. Musculoskeletal: There are no acute or suspicious osseous abnormalities. Thoracic spondylosis. IMPRESSION: 1. Tiny hiatal hernia. 2. Aortic Atherosclerosis (ICD10-I70.0). Electronically Signed   By: Narda Rutherford M.D.   On: 05/31/2023 15:16   Result Date: 05/31/2023 CLINICAL DATA:  Chest pain EXAM: Cardiac/Coronary  CTA TECHNIQUE: The patient was scanned on a Siemens Somatom scanner. : A retrospective scan was triggered in the ascending thoracic aorta. Axial non-contrast 3 mm slices were carried out through the heart. The data set was analyzed on a dedicated work station and scored using the Agatson method. Gantry rotation speed was 66 msecs and collimation was .6 mm. 100mg  of metoprolol and 0.8 mg of sl NTG was given. The 3D data set was reconstructed in 5% intervals of the 60-95 % of the R-R cycle. Diastolic phases were analyzed on a dedicated work station using MPR, MIP and VRT modes. The patient received 80 cc of contrast. FINDINGS: Aorta: Normal size. Mild aortic wall calcifications. No dissection. Aortic Valve:  Trileaflet. mild calcifications. Coronary Arteries:  Normal coronary origin.  Right dominance. RCA is a dominant artery. There is calcified plaque proximally causing mild stenosis (25-49%). Left main gives rise to LAD and LCX arteries. LM  has no disease. LAD has calcified plaque in the proximal-mid segments causing mild stenosis (25-49%). LCX is a non-dominant artery.  There is no plaque. Other findings: Normal pulmonary vein drainage into the left atrium. Normal left atrial appendage without a thrombus. Normal size of the pulmonary artery. IMPRESSION: 1. Coronary calcium score of 938. This was 72nd percentile for age and sex matched control. 2. Normal coronary origin with right dominance. 3. Mild stenosis in LAD, RCA, LCx (25-49%). 4. CAD-RADS 2. Mild non-obstructive CAD (25-49%). Consider non-atherosclerotic causes of chest pain. Consider preventive therapy and risk factor modification. Electronically Signed: By: Debbe Odea M.D. On: 05/12/2023 15:57   US RENAL ARTERY DUPLEX COMPLETE  Result Date: 05/21/2023 CLINICAL DATA:  Resistant hypertension EXAM: RENAL/URINARY TRACT ULTRASOUND RENAL DUPLEX DOPPLER ULTRASOUND COMPARISON:  None Available. FINDINGS: Right Kidney: Length: 9.3 cm. Slightly increased parenchymal echogenicity. No hydronephrosis. Multiple small circumscribed anechoic simple cysts are present. Exophytic from the upper pole is a small simple cyst measuring 1.4 x 1.5 x 1.4 cm. Exophytic from the interpolar region is a 1.0 x 1.3 x 1.4 cm simple cyst. Left Kidney: Length: 10.1 cm. Mildly increased parenchymal echogenicity. No hydronephrosis. Minimally complex cyst with a thin internal septation exophytic from the upper pole. Each lobulation measures 1.4 x 1.0 x 1.5 cm and 1.1 x 0.7 x 0.9 cm. Bladder:  Within normal limits. RENAL DUPLEX ULTRASOUND Right Renal Artery Velocities: Origin:  71 cm/sec Mid:  183 cm/sec Hilum:  63 cm/sec Interlobar:  104 cm/sec Arcuate:  37 cm/sec Left Renal Artery Velocities: Origin:  151 cm/sec Mid:  82 cm/sec Hilum:  51 cm/sec Interlobar:  36 cm/sec Arcuate:  17 cm/sec Aortic Velocity:  73 cm/sec Right Renal-Aortic Ratios: Origin: 0.9 Mid:  2.5 Hilum: 0.9 Interlobar: 1.4 Arcuate: 0.5 Left  Renal-Aortic Ratios: Origin: 2.0 Mid: 1.1 Hilum: 0.7 Interlobar: 0.5 Arcuate: 0.2 IMPRESSION: 1. Negative for hemodynamically significant renal artery stenosis. 2. Mildly echogenic renal parenchyma bilaterally suggests underlying medical renal disease. 3. Bilateral simple renal cysts. No imaging follow-up is recommended. Electronically Signed   By: Malachy Moan M.D.   On: 05/21/2023 06:55    ASSESSMENT & PLAN:   Acute pulmonary embolism without acute cor pulmonale (HCC) # JAN 7th, 2024-Unprovoked- Acute pulmonary embolus within segmental branch of the right upper lobe pulmonary artery and segmental branch of the left lower lobe pulmonary artery. On Eliquis; CTA SEP 2024- NO acute PE. Eliquis 5 mg BID x27m; Currently on eliquis 2.5 mg BID; I would recommend anticoagulation for 12 months.    # Mild anemia Hb 9-10- ? Sec to CKD-/IDA- OFF  gentle iron [iron biglycinate; 28 mg- constipation]. Continue IV venofer-  Might also need GI evaluation down the line. Will make a referral to [facility] re: anemia  # Cerebellar stroke- JAN 2024 [facility]; Dr.Shah- AUG 2023]- S/p right carotid stenting- [april 2024; Dr.Schneir]; Also- noted to have Severe, bilateral basilar artery occlusion, occluded left vertebral artery, moderate right vertebral artery stenosis-[ Dr Arlyss Queen at [facility] neurosurgery [oct 2024]- conservative management. On Asprin Sec to possible Chronic GI bleed-  # Bil LE swelling/edema- recommend continue lasix/ and bil LE stockings- stable.   # Poorly controlled BP-better today. Stable.   # CKD [Dr.Kolluru]- III - recommend avoiding nephrotoxic NSAIDs-Advil/Motrin- Stable.   # DISPOSITION: # referral to [facility] re: anemia #  venofer today. #  weekly other week x3  # follow up in 2  month- MD; labs- cbc/bmp; iron studies, ferritin- possible venofer- Dr.B   All questions were answered. The patient knows to call the clinic with any problems, questions or concerns.    Earna Coder,  MD 06/04/2023 1:59 PM

## 2023-06-09 DIAGNOSIS — R42 Dizziness and giddiness: Secondary | ICD-10-CM | POA: Diagnosis not present

## 2023-06-09 DIAGNOSIS — D631 Anemia in chronic kidney disease: Secondary | ICD-10-CM | POA: Diagnosis not present

## 2023-06-09 DIAGNOSIS — I10 Essential (primary) hypertension: Secondary | ICD-10-CM | POA: Diagnosis not present

## 2023-06-09 DIAGNOSIS — I2510 Atherosclerotic heart disease of native coronary artery without angina pectoris: Secondary | ICD-10-CM | POA: Diagnosis not present

## 2023-06-11 ENCOUNTER — Inpatient Hospital Stay: Payer: Medicare Other

## 2023-06-12 ENCOUNTER — Telehealth: Payer: Medicare Other

## 2023-06-12 NOTE — Telephone Encounter (Signed)
Patient called stating the co payment for his Eliquis has gotten to expensive for him. Patient states his co pay is $148. Patient is asking is there an alternative for this medication. He states he knows he needs it but can't afford it. Patient states he took his last Eliquis pill on yesterday and is unsure how long he will be able to go without this blood thinner. Call back # [phone].

## 2023-06-18 ENCOUNTER — Inpatient Hospital Stay: Payer: Medicare Other

## 2023-06-18 VITALS — BP 139/61 | HR 78 | Temp 98.6°F | Resp 17

## 2023-06-18 DIAGNOSIS — D649 Anemia, unspecified: Secondary | ICD-10-CM

## 2023-06-18 MED ORDER — IRON SUCROSE 20 MG/ML IV SOLN
200.0000 mg | Freq: Once | INTRAVENOUS | Status: AC
  Administered 2023-06-18: 200 mg via INTRAVENOUS

## 2023-06-18 MED ORDER — SODIUM CHLORIDE 0.9% FLUSH
10.0000 mL | Freq: Once | INTRAVENOUS | Status: AC | PRN
  Administered 2023-06-18: 10 mL
  Filled 2023-06-18: qty 10

## 2023-06-18 NOTE — Progress Notes (Signed)
Patient tolerated Venofer infusion well, no questions/concerns voiced. Monitored 30 min post transfusion. Patient stable at discharge. VSS. AVS given.

## 2023-06-18 NOTE — Patient Instructions (Signed)
Iron Sucrose Injection What is this medication? IRON SUCROSE (EYE ern SOO krose) treats low levels of iron (iron deficiency anemia) in people with kidney disease. Iron is a mineral that plays an important role in making red blood cells, which carry oxygen from your lungs to the rest of your body. This medicine may be used for other purposes; ask your health care provider or pharmacist if you have questions. COMMON BRAND NAME(S): Venofer What should I tell my care team before I take this medication? They need to know if you have any of these conditions: Anemia not caused by low iron levels Heart disease High levels of iron in the blood Kidney disease Liver disease An unusual or allergic reaction to iron, other medications, foods, dyes, or preservatives Pregnant or trying to get pregnant Breastfeeding How should I use this medication? This medication is for infusion into a vein. It is given in a hospital or clinic setting. Talk to your care team about the use of this medication in children. While this medication may be prescribed for children as young as 2 years for selected conditions, precautions do apply. Overdosage: If you think you have taken too much of this medicine contact a poison control center or emergency room at once. NOTE: This medicine is only for you. Do not share this medicine with others. What if I miss a dose? Keep appointments for follow-up doses. It is important not to miss your dose. Call your care team if you are unable to keep an appointment. What may interact with this medication? Do not take this medication with any of the following: Deferoxamine Dimercaprol Other iron products This medication may also interact with the following: Chloramphenicol Deferasirox This list may not describe all possible interactions. Give your health care provider a list of all the medicines, herbs, non-prescription drugs, or dietary supplements you use. Also tell them if you smoke,  drink alcohol, or use illegal drugs. Some items may interact with your medicine. What should I watch for while using this medication? Visit your care team regularly. Tell your care team if your symptoms do not start to get better or if they get worse. You may need blood work done while you are taking this medication. You may need to follow a special diet. Talk to your care team. Foods that contain iron include: whole grains/cereals, dried fruits, beans, or peas, leafy green vegetables, and organ meats (liver, kidney). What side effects may I notice from receiving this medication? Side effects that you should report to your care team as soon as possible: Allergic reactions--skin rash, itching, hives, swelling of the face, lips, tongue, or throat Low blood pressure--dizziness, feeling faint or lightheaded, blurry vision Shortness of breath Side effects that usually do not require medical attention (report to your care team if they continue or are bothersome): Flushing Headache Joint pain Muscle pain Nausea Pain, redness, or irritation at injection site This list may not describe all possible side effects. Call your doctor for medical advice about side effects. You may report side effects to FDA at 1-800-FDA-1088. Where should I keep my medication? This medication is given in a hospital or clinic. It will not be stored at home. NOTE: This sheet is a summary. It may not cover all possible information. If you have questions about this medicine, talk to your doctor, pharmacist, or health care provider.  2024 Elsevier/Gold Standard (2023-01-10 00:00:00)

## 2023-06-25 ENCOUNTER — Inpatient Hospital Stay: Payer: Medicare Other | Attending: Internal Medicine

## 2023-06-25 VITALS — BP 106/61 | HR 67 | Temp 98.7°F | Resp 18

## 2023-06-25 DIAGNOSIS — D649 Anemia, unspecified: Secondary | ICD-10-CM | POA: Diagnosis not present

## 2023-06-25 MED ORDER — IRON SUCROSE 20 MG/ML IV SOLN
200.0000 mg | Freq: Once | INTRAVENOUS | Status: AC
  Administered 2023-06-25: 200 mg via INTRAVENOUS
  Filled 2023-06-25: qty 10

## 2023-06-25 NOTE — Patient Instructions (Signed)
Iron Sucrose Injection What is this medication? IRON SUCROSE (EYE ern SOO krose) treats low levels of iron (iron deficiency anemia) in people with kidney disease. Iron is a mineral that plays an important role in making red blood cells, which carry oxygen from your lungs to the rest of your body. This medicine may be used for other purposes; ask your health care provider or pharmacist if you have questions. COMMON BRAND NAME(S): Venofer What should I tell my care team before I take this medication? They need to know if you have any of these conditions: Anemia not caused by low iron levels Heart disease High levels of iron in the blood Kidney disease Liver disease An unusual or allergic reaction to iron, other medications, foods, dyes, or preservatives Pregnant or trying to get pregnant Breastfeeding How should I use this medication? This medication is for infusion into a vein. It is given in a hospital or clinic setting. Talk to your care team about the use of this medication in children. While this medication may be prescribed for children as young as 2 years for selected conditions, precautions do apply. Overdosage: If you think you have taken too much of this medicine contact a poison control center or emergency room at once. NOTE: This medicine is only for you. Do not share this medicine with others. What if I miss a dose? Keep appointments for follow-up doses. It is important not to miss your dose. Call your care team if you are unable to keep an appointment. What may interact with this medication? Do not take this medication with any of the following: Deferoxamine Dimercaprol Other iron products This medication may also interact with the following: Chloramphenicol Deferasirox This list may not describe all possible interactions. Give your health care provider a list of all the medicines, herbs, non-prescription drugs, or dietary supplements you use. Also tell them if you smoke,  drink alcohol, or use illegal drugs. Some items may interact with your medicine. What should I watch for while using this medication? Visit your care team regularly. Tell your care team if your symptoms do not start to get better or if they get worse. You may need blood work done while you are taking this medication. You may need to follow a special diet. Talk to your care team. Foods that contain iron include: whole grains/cereals, dried fruits, beans, or peas, leafy green vegetables, and organ meats (liver, kidney). What side effects may I notice from receiving this medication? Side effects that you should report to your care team as soon as possible: Allergic reactions--skin rash, itching, hives, swelling of the face, lips, tongue, or throat Low blood pressure--dizziness, feeling faint or lightheaded, blurry vision Shortness of breath Side effects that usually do not require medical attention (report to your care team if they continue or are bothersome): Flushing Headache Joint pain Muscle pain Nausea Pain, redness, or irritation at injection site This list may not describe all possible side effects. Call your doctor for medical advice about side effects. You may report side effects to FDA at 1-800-FDA-1088. Where should I keep my medication? This medication is given in a hospital or clinic. It will not be stored at home. NOTE: This sheet is a summary. It may not cover all possible information. If you have questions about this medicine, talk to your doctor, pharmacist, or health care provider.  2024 Elsevier/Gold Standard (2023-01-10 00:00:00)

## 2023-06-30 DIAGNOSIS — G4733 Obstructive sleep apnea (adult) (pediatric): Secondary | ICD-10-CM | POA: Diagnosis not present

## 2023-07-02 ENCOUNTER — Inpatient Hospital Stay: Payer: Medicare Other

## 2023-07-03 ENCOUNTER — Ambulatory Visit (INDEPENDENT_AMBULATORY_CARE_PROVIDER_SITE_OTHER): Payer: Medicare Other

## 2023-07-03 DIAGNOSIS — Z0000 Encounter for general adult medical examination without abnormal findings: Secondary | ICD-10-CM | POA: Diagnosis not present

## 2023-07-03 NOTE — Progress Notes (Signed)
Subjective:   Jared Cline is a 77 y.o. male who presents for Medicare Annual/Subsequent preventive examination.  Visit Complete: Virtual I connected with  Brooks Sailors on 07/03/23 by a audio enabled telemedicine application and verified that I am speaking with the correct person using two identifiers.  Patient Location: Home  Provider Location: Office/Clinic  I discussed the limitations of evaluation and management by telemedicine. The patient expressed understanding and agreed to proceed.  Vital Signs: Because this visit was a virtual/telehealth visit, some criteria may be missing or patient reported. Any vitals not documented were not able to be obtained and vitals that have been documented are patient reported.  Cardiac Risk Factors include: advanced age (>67men, >26 women);dyslipidemia;hypertension;male gender;obesity (BMI >30kg/m2);sedentary lifestyle     Objective:    Today's Vitals   07/03/23 0857  PainSc: 0-No pain   There is no height or weight on file to calculate BMI.     07/03/2023    9:08 AM 06/04/2023   12:51 PM 05/02/2023    9:44 AM 04/27/2023    5:10 AM 12/06/2022   10:00 AM 12/05/2022    2:07 PM 11/26/2022    9:25 PM  Advanced Directives  Does Patient Have a Medical Advance Directive? No No No No No No   Would patient like information on creating a medical advance directive? No - Patient declined No - Patient declined No - Patient declined No - Patient declined No - Patient declined  No - Patient declined    Current Medications (verified) Outpatient Encounter Medications as of 07/03/2023  Medication Sig   acetaminophen (TYLENOL) 650 MG CR tablet Take 2 tablets (1,300 mg total) by mouth every 12 (twelve) hours as needed (Mild pain).   aspirin EC 81 MG tablet Take 1 tablet (81 mg total) by mouth daily at 6 (six) AM. Swallow whole.   cloNIDine (CATAPRES) 0.2 MG tablet TAKE 1 TABLET(0.2 MG) BY MOUTH TWICE DAILY   clopidogrel (PLAVIX) 75 MG tablet Take 1 tablet (75  mg total) by mouth daily.   cyanocobalamin (VITAMIN B12) 1000 MCG tablet Take 1,000 mcg by mouth daily.   diclofenac Sodium (VOLTAREN) 1 % GEL Apply 2 g topically 4 (four) times daily.   hydrALAZINE (APRESOLINE) 10 MG tablet TAKE 1 TABLET BY MOUTH TWICE DAILY AS NEEDED. TAKE IF BLOOD PRESSURE GREATER THAN 140/90   labetalol (NORMODYNE) 100 MG tablet 100 mg 2 (two) times daily.   pravastatin (PRAVACHOL) 80 MG tablet TAKE 1 TABLET(80 MG) BY MOUTH AT BEDTIME   torsemide (DEMADEX) 20 MG tablet Take 1 tablet (20 mg total) by mouth 2 (two) times daily.   valsartan (DIOVAN) 160 MG tablet Take 1 tablet (160 mg total) by mouth daily.   apixaban (ELIQUIS) 2.5 MG TABS tablet Take 1 tablet (2.5 mg total) by mouth 2 (two) times daily. (Patient not taking: Reported on 07/03/2023)   ferrous sulfate 325 (65 FE) MG EC tablet Take 1 tablet (325 mg total) by mouth daily with breakfast. (Patient not taking: Reported on 07/03/2023)   metoprolol tartrate (LOPRESSOR) 100 MG tablet Take 1 tablet (100mg ) TWO hours prior to CT scan (Patient not taking: Reported on 07/03/2023)   No facility-administered encounter medications on file as of 07/03/2023.    Allergies (verified) Flomax [tamsulosin hcl], Ace inhibitors, Amlodipine, Doxazosin, and Lipitor [atorvastatin]   History: Past Medical History:  Diagnosis Date   BPH (benign prostatic hypertrophy)    Bradycardia    evaluated by Dr. Gwen Pounds   Cancer [facility])  prostate   Carpal tunnel syndrome, left    left arm   CKD (chronic kidney disease) stage 2, GFR 60-89 ml/min    Diverticulitis    Gastritis 2017   History of prostate cancer    HNP (herniated nucleus pulposus), lumbar    Hyperlipidemia    Hypertension    IBS (irritable bowel syndrome)    Impotence    Multiple lung nodules    nonspecific, largest 5mm in Jan 2016; scanned March 2017; rescan 3-6 months later   OA (osteoarthritis) of knee    right   Renal mass    evaluated; followed by urologist    Sleep apnea    on CPAP   Stroke (HCC)    Torn meniscus    x 2; right knee   Past Surgical History:  Procedure Laterality Date   abdominal ultrasound  Oct 2015   Negative for eval for AAA   CAROTID PTA/STENT INTERVENTION Right 11/26/2022   Procedure: CAROTID PTA/STENT INTERVENTION;  Surgeon: Renford Dills, MD;  Location: [facility] INVASIVE CV LAB;  Service: Cardiovascular;  Laterality: Right;   COLONOSCOPY     KNEE ARTHROSCOPY Right    x 2   LUMBAR LAMINECTOMY     PROSTATECTOMY     robotic   Family History  Problem Relation Age of Onset   Stroke Mother    Hypertension Mother    Aneurysm Father        cerebral   Hypertension Father    Heart disease Paternal Uncle    Alcohol abuse Brother    Cancer Neg Hx    COPD Neg Hx    Diabetes Neg Hx    Social History   Socioeconomic History   Marital status: Married    Spouse name: Ladislav Glotfelty   Number of children: 3   Years of education: Not on file   Highest education level: Not on file  Occupational History   Occupation: retired [employer]  Tobacco Use   Smoking status: Former    Current packs/day: 0.00    Average packs/day: 0.5 packs/day for 9.0 years (4.5 ttl pk-yrs)    Types: Cigarettes    Start date: 08/19/1962    Quit date: 08/20/1971    Years since quitting: 51.9    Passive exposure: Past   Smokeless tobacco: Never  Vaping Use   Vaping status: Never Used  Substance and Sexual Activity   Alcohol use: No    Alcohol/week: 0.0 standard drinks of alcohol   Drug use: No   Sexual activity: Not Currently  Other Topics Concern   Not on file  Social History Narrative   Retired from city of [place] 2012.   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    Social Determinants of Health   Financial Resource Strain: Low Risk  (07/03/2023)   Overall Financial Resource Strain (CARDIA)    Difficulty of Paying Living Expenses: Not very hard  Food Insecurity: No Food Insecurity (07/03/2023)   Hunger Vital  Sign    Worried About Running Out of Food in the Last Year: Never true    Ran Out of Food in the Last Year: Never true  Transportation Needs: No Transportation Needs (07/03/2023)   PRAPARE - Administrator, Civil Service (Medical): No    Lack of Transportation (Non-Medical): No  Physical Activity: Insufficiently Active (07/03/2023)   Exercise Vital Sign    Days of Exercise per Week: 3 days    Minutes of Exercise  per Session: 30 min  Stress: No Stress Concern Present (07/03/2023)   [employer] of Occupational Health - Occupational Stress Questionnaire    Feeling of Stress : Only a little  Social Connections: Moderately Integrated (07/03/2023)   Social Connection and Isolation Panel [NHANES]    Frequency of Communication with Friends and Family: Three times a week    Frequency of Social Gatherings with Friends and Family: Twice a week    Attends Religious Services: More than 4 times per year    Active Member of [employer] or Organizations: No    Attends Engineer, structural: Never    Marital Status: Married    Tobacco Counseling Counseling given: Not Answered   Clinical Intake:  Pre-visit preparation completed: Yes  Pain : No/denies pain Pain Score: 0-No pain     BMI - recorded: 31.7 Nutritional Status: BMI > 30  Obese Nutritional Risks: None Diabetes: No  How often do you need to have someone help you when you read instructions, pamphlets, or other written materials from your doctor or pharmacy?: 1 - Never  Interpreter Needed?: No  Information entered by :: Kennedy Bucker, LPN   Activities of Daily Living    07/03/2023    9:09 AM 04/30/2023    2:17 PM  In your present state of health, do you have any difficulty performing the following activities:  Hearing? 0 0  Vision? 0 0  Difficulty concentrating or making decisions? 0 1  Walking or climbing stairs? 0 1  Dressing or bathing? 0 0  Doing errands, shopping? 0 0  Preparing Food and eating  ? N   Using the Toilet? N   In the past six months, have you accidently leaked urine? N   Do you have problems with loss of bowel control? N   Managing your Medications? N   Managing your Finances? N   Housekeeping or managing your Housekeeping? N     Patient Care Team: Berniece Salines, FNP as PCP - General (Nurse Practitioner) Lamar Blinks, MD as Consulting Physician (Cardiology) Lorain Childes, MD as Consulting Physician (Nephrology) Sondra Come, MD as Consulting Physician (Urology) Earna Coder, MD as Consulting Physician (Oncology)  Indicate any recent Medical Services you may have received from other than [facility] providers in the past year (date may be approximate).     Assessment:   This is a routine wellness examination for Aaidan.  Hearing/Vision screen Hearing Screening - Comments:: No aids-  Vision Screening - Comments:: Wears glasses- Dr.Woodard   Goals Addressed             This Visit's Progress    DIET - EAT MORE FRUITS AND VEGETABLES         Depression Screen    07/03/2023    9:04 AM 04/30/2023    2:16 PM 03/31/2023   10:21 AM 03/21/2023    3:06 PM 01/22/2023   10:50 AM 01/07/2023    3:09 PM 12/20/2022    8:49 AM  PHQ 2/9 Scores  PHQ - 2 Score 0 0 0 0 0 0 0  PHQ- 9 Score 0 0  0  0     Fall Risk    07/03/2023    9:09 AM 04/30/2023    2:16 PM 03/31/2023   10:21 AM 03/21/2023    3:06 PM 01/22/2023   10:49 AM  Fall Risk   Falls in the past year? 1 1 0 0 0  Number falls in past yr: 1 1  0 0 0  Injury with Fall? 0 1 0 0 0  Risk for fall due to : No Fall Risks;Impaired balance/gait;Impaired mobility      Follow up Falls prevention discussed;Falls evaluation completed        MEDICARE RISK AT HOME: Medicare Risk at Home Any stairs in or around the home?: Yes If so, are there any without handrails?: No Home free of loose throw rugs in walkways, pet beds, electrical cords, etc?: Yes Adequate lighting in your home to reduce risk of falls?:  Yes Life alert?: No Use of a cane, walker or w/c?: Yes (cane all the time) Grab bars in the bathroom?: No Shower chair or bench in shower?: Yes Elevated toilet seat or a handicapped toilet?: No  TIMED UP AND GO:  Was the test performed?  No    Cognitive Function:        07/03/2023    9:11 AM 07/09/2022    1:35 PM 07/04/2020    1:38 PM  6CIT Screen  What Year? 0 points 0 points 0 points  What month? 0 points 0 points 0 points  What time? 0 points 0 points 0 points  Count back from 20 0 points 2 points 0 points  Months in reverse 0 points 0 points 2 points  Repeat phrase 0 points 4 points 0 points  Total Score 0 points 6 points 2 points    Immunizations Immunization History  Administered Date(s) Administered   Fluad Quad(high Dose 65+) 05/25/2019, 07/04/2020, 06/18/2021   Influenza, High Dose Seasonal PF 06/09/2017, 06/11/2018   PFIZER(Purple Top)SARS-COV-2 Vaccination 10/23/2019, 11/13/2019, 06/16/2020   Pneumococcal Conjugate-13 06/09/2017   Pneumococcal Polysaccharide-23 05/21/2011   Td 08/19/2006    TDAP status: Due, Education has been provided regarding the importance of this vaccine. Advised may receive this vaccine at local pharmacy or Health Dept. Aware to provide a copy of the vaccination record if obtained from local pharmacy or Health Dept. Verbalized acceptance and understanding.  Flu Vaccine status: Declined, Education has been provided regarding the importance of this vaccine but patient still declined. Advised may receive this vaccine at local pharmacy or Health Dept. Aware to provide a copy of the vaccination record if obtained from local pharmacy or Health Dept. Verbalized acceptance and understanding.  Pneumococcal vaccine status: Up to date  Covid-19 vaccine status: Completed vaccines  Qualifies for Shingles Vaccine? Yes   Zostavax completed No   Shingrix Completed?: No.    Education has been provided regarding the importance of this vaccine. Patient  has been advised to call insurance company to determine out of pocket expense if they have not yet received this vaccine. Advised may also receive vaccine at local pharmacy or Health Dept. Verbalized acceptance and understanding.  Screening Tests Health Maintenance  Topic Date Due   Zoster Vaccines- Shingrix (1 of 2) Never done   COVID-19 Vaccine (4 - 2023-24 season) 04/20/2023   INFLUENZA VACCINE  11/17/2023 (Originally 03/20/2023)   Medicare Annual Wellness (AWV)  07/02/2024   Pneumonia Vaccine 11+ Years old  Completed   Hepatitis C Screening  Completed   HPV VACCINES  Aged Out   DTaP/Tdap/Td  Discontinued   Colonoscopy  Discontinued    Health Maintenance  Health Maintenance Due  Topic Date Due   Zoster Vaccines- Shingrix (1 of 2) Never done   COVID-19 Vaccine (4 - 2023-24 season) 04/20/2023    Colorectal cancer screening: No longer required.   Lung Cancer Screening: (Low Dose CT Chest recommended if Age 25-80  years, 20 pack-year currently smoking OR have quit w/in 15years.) does not qualify.    Additional Screening:  Hepatitis C Screening: does qualify; Completed 07/21/20  Vision Screening: Recommended annual ophthalmology exams for early detection of glaucoma and other disorders of the eye. Is the patient up to date with their annual eye exam?  Yes  Who is the provider or what is the name of the office in which the patient attends annual eye exams? Dr.Woodard If pt is not established with a provider, would they like to be referred to a provider to establish care? No .   Dental Screening: Recommended annual dental exams for proper oral hygiene   Community Resource Referral / Chronic Care Management: CRR required this visit?  No   CCM required this visit?  No     Plan:     I have personally reviewed and noted the following in the patient's chart:   Medical and social history Use of alcohol, tobacco or illicit drugs  Current medications and supplements including  opioid prescriptions. Patient is not currently taking opioid prescriptions. Functional ability and status Nutritional status Physical activity Advanced directives List of other physicians Hospitalizations, surgeries, and ER visits in previous 12 months Vitals Screenings to include cognitive, depression, and falls Referrals and appointments  In addition, I have reviewed and discussed with patient certain preventive protocols, quality metrics, and best practice recommendations. A written personalized care plan for preventive services as well as general preventive health recommendations were provided to patient.     Hal Hope, LPN   [id number]/05/[id number]   After Visit Summary: (MyChart) Due to this being a telephonic visit, the after visit summary with patients personalized plan was offered to patient via MyChart   Nurse Notes: none

## 2023-07-03 NOTE — Patient Instructions (Addendum)
Jared Cline , Thank you for taking time to come for your Medicare Wellness Visit. I appreciate your ongoing commitment to your health goals. Please review the following plan we discussed and let me know if I can assist you in the future.   Referrals/Orders/Follow-Ups/Clinician Recommendations: none  This is a list of the screening recommended for you and due dates:  Health Maintenance  Topic Date Due   Zoster (Shingles) Vaccine (1 of 2) Never done   COVID-19 Vaccine (4 - 2023-24 season) 04/20/2023   Flu Shot  11/17/2023*   Medicare Annual Wellness Visit  07/02/2024   Pneumonia Vaccine  Completed   Hepatitis C Screening  Completed   HPV Vaccine  Aged Out   DTaP/Tdap/Td vaccine  Discontinued   Colon Cancer Screening  Discontinued  *Topic was postponed. The date shown is not the original due date.    Advanced directives: (ACP Link)Information on Advanced Care Planning can be found at [facility] Directives Advance Health Care Directives ([email or web address]   Next Medicare Annual Wellness Visit scheduled for next year: Yes   07/08/24 @ 9:30 am in person

## 2023-07-07 ENCOUNTER — Ambulatory Visit (INDEPENDENT_AMBULATORY_CARE_PROVIDER_SITE_OTHER): Payer: Medicare Other | Admitting: Vascular Surgery

## 2023-07-07 ENCOUNTER — Encounter (INDEPENDENT_AMBULATORY_CARE_PROVIDER_SITE_OTHER): Payer: Self-pay | Admitting: Vascular Surgery

## 2023-07-07 ENCOUNTER — Ambulatory Visit (INDEPENDENT_AMBULATORY_CARE_PROVIDER_SITE_OTHER): Payer: Medicare Other

## 2023-07-07 VITALS — BP 171/66 | HR 58 | Resp 18 | Ht 72.0 in | Wt 235.2 lb

## 2023-07-07 DIAGNOSIS — E782 Mixed hyperlipidemia: Secondary | ICD-10-CM

## 2023-07-07 DIAGNOSIS — I10 Essential (primary) hypertension: Secondary | ICD-10-CM | POA: Diagnosis not present

## 2023-07-07 DIAGNOSIS — I70213 Atherosclerosis of native arteries of extremities with intermittent claudication, bilateral legs: Secondary | ICD-10-CM | POA: Diagnosis not present

## 2023-07-07 DIAGNOSIS — I25118 Atherosclerotic heart disease of native coronary artery with other forms of angina pectoris: Secondary | ICD-10-CM

## 2023-07-07 DIAGNOSIS — I6529 Occlusion and stenosis of unspecified carotid artery: Secondary | ICD-10-CM

## 2023-07-07 DIAGNOSIS — I6521 Occlusion and stenosis of right carotid artery: Secondary | ICD-10-CM | POA: Diagnosis not present

## 2023-07-07 NOTE — Progress Notes (Signed)
MRN : [id number]  Jared Cline is a 77 y.o. ([birth year]) male who presents with chief complaint of check carotid arteries.  History of Present Illness:   The patient is seen for follow up evaluation of carotid stenosis status post right carotid stent placement on 11/26/2022.    Procedure:  Placement of a 9 x 7 x 40 exact stent with the use of the NAV-6 embolic protection device in the right internal carotid artery  There were no post operative problems or complications related to the surgery.  The patient denies neck or incisional pain.  The patient denies interval amaurosis fugax. There is no recent history of TIA symptoms or focal motor deficits. There is no prior documented CVA.  The patient denies headache.  The patient is taking enteric-coated aspirin 81 mg daily.  No recent shortening of the patient's walking distance or new symptoms consistent with claudication.  No history of rest pain symptoms. No new ulcers or wounds of the lower extremities have occurred.  There is no history of DVT, PE or superficial thrombophlebitis. No recent episodes of angina or shortness of breath documented.   Duplex ultrasound of the carotid artery team today demonstrates 1 to 39% bilateral ICA stenosis.  The right internal carotid artery stent is widely patent with normal velocities throughout.  No outpatient medications have been marked as taking for the 07/07/23 encounter (Appointment) with Gilda Crease, Latina Craver, MD.    Past Medical History:  Diagnosis Date   BPH (benign prostatic hypertrophy)    Bradycardia    evaluated by Dr. Gwen Pounds   Cancer [facility])    prostate   Carpal tunnel syndrome, left    left arm   CKD (chronic kidney disease) stage 2, GFR 60-89 ml/min    Diverticulitis    Gastritis 2017   History of prostate cancer    HNP (herniated nucleus pulposus), lumbar    Hyperlipidemia    Hypertension    IBS (irritable bowel syndrome)    Impotence    Multiple  lung nodules    nonspecific, largest 5mm in Jan 2016; scanned March 2017; rescan 3-6 months later   OA (osteoarthritis) of knee    right   Renal mass    evaluated; followed by urologist   Sleep apnea    on CPAP   Stroke (HCC)    Torn meniscus    x 2; right knee    Past Surgical History:  Procedure Laterality Date   abdominal ultrasound  Oct 2015   Negative for eval for AAA   CAROTID PTA/STENT INTERVENTION Right 11/26/2022   Procedure: CAROTID PTA/STENT INTERVENTION;  Surgeon: Renford Dills, MD;  Location: [facility] INVASIVE CV LAB;  Service: Cardiovascular;  Laterality: Right;   COLONOSCOPY     KNEE ARTHROSCOPY Right    x 2   LUMBAR LAMINECTOMY     PROSTATECTOMY     robotic    Social History Social History   Tobacco Use   Smoking status: Former    Current packs/day: 0.00    Average packs/day: 0.5 packs/day for 9.0 years (4.5 ttl pk-yrs)    Types: Cigarettes    Start date: 08/19/1962    Quit date: 08/20/1971    Years since quitting: 51.9    Passive exposure: Past   Smokeless tobacco: Never  Vaping Use   Vaping status: Never Used  Substance Use Topics  Alcohol use: No    Alcohol/week: 0.0 standard drinks of alcohol   Drug use: No    Family History Family History  Problem Relation Age of Onset   Stroke Mother    Hypertension Mother    Aneurysm Father        cerebral   Hypertension Father    Heart disease Paternal Uncle    Alcohol abuse Brother    Cancer Neg Hx    COPD Neg Hx    Diabetes Neg Hx     Allergies  Allergen Reactions   Flomax [Tamsulosin Hcl] Shortness Of Breath   Ace Inhibitors Other (See Comments)    Angioedema   Amlodipine Other (See Comments)    Angioedema   Doxazosin    Lipitor [Atorvastatin] Other (See Comments)    Memory loss     REVIEW OF SYSTEMS (Negative unless checked)  Constitutional: [] Weight loss  [] Fever  [] Chills Cardiac: [] Chest pain   [] Chest pressure   [] Palpitations   [] Shortness of breath when laying flat    [] Shortness of breath with exertion. Vascular:  [x] Pain in legs with walking   [] Pain in legs at rest  [] History of DVT   [] Phlebitis   [] Swelling in legs   [] Varicose veins   [] Non-healing ulcers Pulmonary:   [] Uses home oxygen   [] Productive cough   [] Hemoptysis   [] Wheeze  [] COPD   [] Asthma Neurologic:  [] Dizziness   [] Seizures   [] History of stroke   [] History of TIA  [] Aphasia   [] Vissual changes   [] Weakness or numbness in arm   [] Weakness or numbness in leg Musculoskeletal:   [] Joint swelling   [] Joint pain   [] Low back pain Hematologic:  [] Easy bruising  [] Easy bleeding   [] Hypercoagulable state   [] Anemic Gastrointestinal:  [] Diarrhea   [] Vomiting  [] Gastroesophageal reflux/heartburn   [] Difficulty swallowing. Genitourinary:  [] Chronic kidney disease   [] Difficult urination  [] Frequent urination   [] Blood in urine Skin:  [] Rashes   [] Ulcers  Psychological:  [] History of anxiety   []  History of major depression.  Physical Examination  There were no vitals filed for this visit. There is no height or weight on file to calculate BMI. Gen: WD/WN, NAD Head: [place]/AT, No temporalis wasting.  Ear/Nose/Throat: Hearing grossly intact, nares w/o erythema or drainage Eyes: PER, EOMI, sclera nonicteric.  Neck: Supple, no masses.  No bruit or JVD.  Pulmonary:  Good air movement, no audible wheezing, no use of accessory muscles.  Cardiac: RRR, normal S1, S2, no Murmurs. Vascular:  carotid bruit noted Vessel Right Left  Radial Palpable Palpable  Carotid  Palpable  Palpable  Gastrointestinal: soft, non-distended. No guarding/no peritoneal signs.  Musculoskeletal: M/S 5/5 throughout.  No visible deformity.  Neurologic: CN 2-12 intact. Pain and light touch intact in extremities.  Symmetrical.  Speech is fluent. Motor exam as listed above. Psychiatric: Judgment intact, Mood & affect appropriate for pt's clinical situation. Dermatologic: No rashes or ulcers noted.  No changes consistent with  cellulitis.   CBC Lab Results  Component Value Date   WBC 5.5 06/04/2023   HGB 9.5 (L) 06/04/2023   HCT 30.5 (L) 06/04/2023   MCV 85.4 06/04/2023   PLT 283 06/04/2023    BMET    Component Value Date/Time   NA 132 (L) 05/02/2023 0943   NA 135 10/19/2015 1048   NA 133 (L) 08/25/2014 1020   K 3.7 05/02/2023 0943   K 4.1 08/25/2014 1020   CL 102 05/02/2023 0943   CL 100  08/25/2014 1020   CO2 21 (L) 05/02/2023 0943   CO2 27 08/25/2014 1020   GLUCOSE 146 (H) 05/02/2023 0943   GLUCOSE 95 08/25/2014 1020   BUN 26 (H) 05/02/2023 0943   BUN 12 10/19/2015 1048   BUN 13 08/25/2014 1020   CREATININE 2.20 (H) 05/12/2023 1449   CREATININE 1.83 (H) 05/02/2023 0943   CREATININE 1.83 (H) 04/30/2023 1441   CALCIUM 9.3 05/02/2023 0943   CALCIUM 9.1 08/25/2014 1020   GFRNONAA 37 (L) 05/02/2023 0943   GFRNONAA 49 (L) 07/21/2020 0930   GFRAA 57 (L) 07/21/2020 0930   CrCl cannot be calculated (Patient's most recent lab result is older than the maximum 21 days allowed.).  COAG No results found for: "INR", "PROTIME"  Radiology No results found.   Assessment/Plan 1. Symptomatic carotid artery stenosis, unspecified laterality Recommend:  The patient is s/p successful right carotid stent  Duplex ultrasound of the carotid artery team today demonstrates 1 to 39% bilateral ICA stenosis.  The right internal carotid artery stent is widely patent with normal velocities throughout.  Continue dual antiplatelet therapy as prescribed for a minimum of 6 months Continue management of CAD, HTN and Hyperlipidemia Healthy heart diet,  encouraged exercise at least 4 times per week  The patient's NIHSS score is as follows: 3 Mild: 1 - 5 Mild to Moderately Severe: 5 - 14 Severe: 15 - 24 Very Severe: >25  Follow up in 12 months with duplex ultrasound and physical exam based on the patient's carotid surgery and <30% stenosis of the bilateral carotid artery  - VAS US CAROTID; Future  2.  Atherosclerosis of native artery of both lower extremities with intermittent claudication (HCC)  Recommend:  The patient has evidence of atherosclerosis of the lower extremities with claudication.  The patient does not voice lifestyle limiting changes at this point in time.  Noninvasive studies do not suggest clinically significant change.  No invasive studies, angiography or surgery at this time The patient should continue walking and begin a more formal exercise program.  The patient should continue antiplatelet therapy and aggressive treatment of the lipid abnormalities  No changes in the patient's medications at this time  Continued surveillance is indicated as atherosclerosis is likely to progress with time.    The patient will continue follow up with noninvasive studies as ordered.   3. Primary hypertension Continue antihypertensive medications as already ordered, these medications have been reviewed and there are no changes at this time.  4. Coronary artery disease of native artery of native heart with stable angina pectoris (HCC) Continue cardiac and antihypertensive medications as already ordered and reviewed, no changes at this time.  Continue statin as ordered and reviewed, no changes at this time  Nitrates PRN for chest pain  5. Mixed hyperlipidemia Continue statin as ordered and reviewed, no changes at this time    Levora Dredge, MD  07/07/2023 1:18 PM

## 2023-07-23 DIAGNOSIS — M170 Bilateral primary osteoarthritis of knee: Secondary | ICD-10-CM | POA: Diagnosis not present

## 2023-08-05 DIAGNOSIS — I10 Essential (primary) hypertension: Secondary | ICD-10-CM | POA: Diagnosis not present

## 2023-08-05 DIAGNOSIS — I119 Hypertensive heart disease without heart failure: Secondary | ICD-10-CM | POA: Diagnosis not present

## 2023-08-05 DIAGNOSIS — G4733 Obstructive sleep apnea (adult) (pediatric): Secondary | ICD-10-CM | POA: Diagnosis not present

## 2023-08-05 DIAGNOSIS — I2510 Atherosclerotic heart disease of native coronary artery without angina pectoris: Secondary | ICD-10-CM | POA: Diagnosis not present

## 2023-08-06 ENCOUNTER — Other Ambulatory Visit: Payer: Medicare Other

## 2023-08-06 ENCOUNTER — Ambulatory Visit: Payer: Medicare Other

## 2023-08-06 ENCOUNTER — Ambulatory Visit: Payer: Medicare Other | Admitting: Internal Medicine

## 2023-08-07 ENCOUNTER — Inpatient Hospital Stay: Payer: Medicare Other | Attending: Internal Medicine

## 2023-08-07 ENCOUNTER — Inpatient Hospital Stay: Payer: Medicare Other | Admitting: Nurse Practitioner

## 2023-08-07 ENCOUNTER — Encounter: Payer: Self-pay | Admitting: Nurse Practitioner

## 2023-08-07 ENCOUNTER — Inpatient Hospital Stay: Payer: Medicare Other

## 2023-08-07 ENCOUNTER — Other Ambulatory Visit: Payer: Self-pay | Admitting: Nurse Practitioner

## 2023-08-07 VITALS — BP 131/59 | HR 78 | Temp 97.3°F | Wt 234.0 lb

## 2023-08-07 VITALS — BP 140/60 | HR 64 | Temp 97.5°F

## 2023-08-07 DIAGNOSIS — Z8673 Personal history of transient ischemic attack (TIA), and cerebral infarction without residual deficits: Secondary | ICD-10-CM | POA: Insufficient documentation

## 2023-08-07 DIAGNOSIS — D509 Iron deficiency anemia, unspecified: Secondary | ICD-10-CM

## 2023-08-07 DIAGNOSIS — D631 Anemia in chronic kidney disease: Secondary | ICD-10-CM

## 2023-08-07 DIAGNOSIS — N183 Chronic kidney disease, stage 3 unspecified: Secondary | ICD-10-CM | POA: Diagnosis not present

## 2023-08-07 DIAGNOSIS — I2699 Other pulmonary embolism without acute cor pulmonale: Secondary | ICD-10-CM

## 2023-08-07 DIAGNOSIS — Z7901 Long term (current) use of anticoagulants: Secondary | ICD-10-CM | POA: Insufficient documentation

## 2023-08-07 DIAGNOSIS — Z87891 Personal history of nicotine dependence: Secondary | ICD-10-CM | POA: Diagnosis not present

## 2023-08-07 DIAGNOSIS — Z86711 Personal history of pulmonary embolism: Secondary | ICD-10-CM | POA: Insufficient documentation

## 2023-08-07 DIAGNOSIS — R609 Edema, unspecified: Secondary | ICD-10-CM | POA: Insufficient documentation

## 2023-08-07 DIAGNOSIS — N1832 Chronic kidney disease, stage 3b: Secondary | ICD-10-CM | POA: Diagnosis not present

## 2023-08-07 DIAGNOSIS — D649 Anemia, unspecified: Secondary | ICD-10-CM

## 2023-08-07 DIAGNOSIS — I10 Essential (primary) hypertension: Secondary | ICD-10-CM

## 2023-08-07 LAB — CBC WITH DIFFERENTIAL (CANCER CENTER ONLY)
Abs Immature Granulocytes: 0.03 10*3/uL (ref 0.00–0.07)
Basophils Absolute: 0.1 10*3/uL (ref 0.0–0.1)
Basophils Relative: 1 %
Eosinophils Absolute: 0.1 10*3/uL (ref 0.0–0.5)
Eosinophils Relative: 1 %
HCT: 36.3 % — ABNORMAL LOW (ref 39.0–52.0)
Hemoglobin: 11.8 g/dL — ABNORMAL LOW (ref 13.0–17.0)
Immature Granulocytes: 0 %
Lymphocytes Relative: 26 %
Lymphs Abs: 1.8 10*3/uL (ref 0.7–4.0)
MCH: 27.8 pg (ref 26.0–34.0)
MCHC: 32.5 g/dL (ref 30.0–36.0)
MCV: 85.6 fL (ref 80.0–100.0)
Monocytes Absolute: 0.4 10*3/uL (ref 0.1–1.0)
Monocytes Relative: 6 %
Neutro Abs: 4.6 10*3/uL (ref 1.7–7.7)
Neutrophils Relative %: 66 %
Platelet Count: 315 10*3/uL (ref 150–400)
RBC: 4.24 MIL/uL (ref 4.22–5.81)
RDW: 13.5 % (ref 11.5–15.5)
WBC Count: 6.9 10*3/uL (ref 4.0–10.5)
nRBC: 0 % (ref 0.0–0.2)

## 2023-08-07 LAB — FERRITIN: Ferritin: 87 ng/mL (ref 24–336)

## 2023-08-07 LAB — IRON AND TIBC
Iron: 43 ug/dL — ABNORMAL LOW (ref 45–182)
Saturation Ratios: 13 % — ABNORMAL LOW (ref 17.9–39.5)
TIBC: 340 ug/dL (ref 250–450)
UIBC: 297 ug/dL

## 2023-08-07 LAB — BASIC METABOLIC PANEL
Anion gap: 13 (ref 5–15)
BUN: 23 mg/dL (ref 8–23)
CO2: 23 mmol/L (ref 22–32)
Calcium: 9.6 mg/dL (ref 8.9–10.3)
Chloride: 102 mmol/L (ref 98–111)
Creatinine, Ser: 1.78 mg/dL — ABNORMAL HIGH (ref 0.61–1.24)
GFR, Estimated: 39 mL/min — ABNORMAL LOW (ref >60)
Glucose, Bld: 135 mg/dL — ABNORMAL HIGH (ref 70–99)
Potassium: 4 mmol/L (ref 3.5–5.1)
Sodium: 138 mmol/L (ref 135–145)

## 2023-08-07 MED ORDER — APIXABAN 2.5 MG PO TABS: 2.5000 mg | ORAL_TABLET | Freq: Two times a day (BID) | ORAL | 0 refills | Status: DC

## 2023-08-07 MED ORDER — IRON SUCROSE 20 MG/ML IV SOLN
200.0000 mg | Freq: Once | INTRAVENOUS | Status: AC
  Administered 2023-08-07: 200 mg via INTRAVENOUS
  Filled 2023-08-07: qty 10

## 2023-08-07 NOTE — Progress Notes (Signed)
[facility] CONSULT NOTE  Patient Care Team: Berniece Salines, FNP as PCP - General (Nurse Practitioner) Lamar Blinks, MD as Consulting Physician (Cardiology) Lorain Childes, MD as Consulting Physician (Nephrology) Sondra Come, MD as Consulting Physician (Urology) Earna Coder, MD as Consulting Physician (Oncology)  CHIEF COMPLAINTS/PURPOSE OF CONSULTATION: DVT/PE  # IMPRESSION: 1. Exam is positive for acute pulmonary embolus within segmental branch of the right upper lobe pulmonary artery and segmental branch of the left lower lobe pulmonary artery. 2. Mild mosaic attenuation of the lungs which may reflect small airways disease. 3. Multiple complicated renal cysts, reference abdominal MRI with follow-up recommendations 07/09/2021. 4. Coronary artery calcifications noted. 5. 5 mm right upper lobe pulmonary nodule is stable from 2019 and is considered benign. 6.  Aortic Atherosclerosis (ICD10-I70.0).  Electronically Signed   By: Signa Kell M.D.   On: 08/25/2022  Oncology History   No history exists.    HISTORY OF PRESENTING ILLNESS: Patient ambulating-with assistance/walker.  Accompanied by wife.   Jared Cline 77 y.o.  male history of poorly controlled blood pressure; history of stroke; status post carotid stenting [on asprin ONLY; dr.Schnier];  chronic kidney disease and Hx of PE [JAN 2024] - unprovoked on eliquis 2.5mg  BID [since SEP 2024] is here for a follow up. He's feeling well. Energy ha simproved. Has pain of left shoulder for past few weeks. His energy has improved. Denies black or bloody stools. No falls.    Review of Systems  Constitutional:  Positive for malaise/fatigue. Negative for chills, diaphoresis, fever and weight loss.  HENT:  Negative for nosebleeds and sore throat.   Eyes:  Negative for double vision.  Respiratory:  Positive for shortness of breath. Negative for cough, hemoptysis, sputum production and wheezing.    Cardiovascular:  Negative for chest pain, palpitations, orthopnea and leg swelling.  Gastrointestinal:  Negative for abdominal pain, blood in stool, constipation, diarrhea, heartburn, melena, nausea and vomiting.  Genitourinary:  Negative for dysuria, frequency and urgency.  Musculoskeletal:  Positive for back pain and joint pain. Negative for falls, myalgias and neck pain.  Skin: Negative.  Negative for itching and rash.  Neurological:  Negative for dizziness, tingling, focal weakness, weakness and headaches.  Endo/Heme/Allergies:  Does not bruise/bleed easily.  Psychiatric/Behavioral:  Negative for depression. The patient is not nervous/anxious and does not have insomnia.      MEDICAL HISTORY:  Past Medical History:  Diagnosis Date   BPH (benign prostatic hypertrophy)    Bradycardia    evaluated by Dr. Gwen Pounds   Cancer [facility])    prostate   Carpal tunnel syndrome, left    left arm   CKD (chronic kidney disease) stage 2, GFR 60-89 ml/min    Diverticulitis    Gastritis 2017   History of prostate cancer    HNP (herniated nucleus pulposus), lumbar    Hyperlipidemia    Hypertension    IBS (irritable bowel syndrome)    Impotence    Multiple lung nodules    nonspecific, largest 5mm in Jan 2016; scanned March 2017; rescan 3-6 months later   OA (osteoarthritis) of knee    right   Renal mass    evaluated; followed by urologist   Sleep apnea    on CPAP   Stroke (HCC)    Torn meniscus    x 2; right knee    SURGICAL HISTORY: Past Surgical History:  Procedure Laterality Date   abdominal ultrasound  Oct 2015   Negative for eval  for AAA   CAROTID PTA/STENT INTERVENTION Right 11/26/2022   Procedure: CAROTID PTA/STENT INTERVENTION;  Surgeon: Renford Dills, MD;  Location: [facility] INVASIVE CV LAB;  Service: Cardiovascular;  Laterality: Right;   COLONOSCOPY     KNEE ARTHROSCOPY Right    x 2   LUMBAR LAMINECTOMY     PROSTATECTOMY     robotic    SOCIAL HISTORY: Social History    Socioeconomic History   Marital status: Married    Spouse name: Melinda Cuny   Number of children: 3   Years of education: Not on file   Highest education level: Not on file  Occupational History   Occupation: retired [employer]  Tobacco Use   Smoking status: Former    Current packs/day: 0.00    Average packs/day: 0.5 packs/day for 9.0 years (4.5 ttl pk-yrs)    Types: Cigarettes    Start date: 08/19/1962    Quit date: 08/20/1971    Years since quitting: 52.0    Passive exposure: Past   Smokeless tobacco: Never  Vaping Use   Vaping status: Never Used  Substance and Sexual Activity   Alcohol use: No    Alcohol/week: 0.0 standard drinks of alcohol   Drug use: No   Sexual activity: Not Currently  Other Topics Concern   Not on file  Social History Narrative   Retired from [employer] [place] 2012.   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    Social Drivers of Corporate investment banker Strain: Low Risk  (07/03/2023)   Overall Financial Resource Strain (CARDIA)    Difficulty of Paying Living Expenses: Not very hard  Food Insecurity: No Food Insecurity (07/03/2023)   Hunger Vital Sign    Worried About Running Out of Food in the Last Year: Never true    Ran Out of Food in the Last Year: Never true  Transportation Needs: No Transportation Needs (07/03/2023)   PRAPARE - Administrator, Civil Service (Medical): No    Lack of Transportation (Non-Medical): No  Physical Activity: Insufficiently Active (07/03/2023)   Exercise Vital Sign    Days of Exercise per Week: 3 days    Minutes of Exercise per Session: 30 min  Stress: No Stress Concern Present (07/03/2023)   [employer] of Occupational Health - Occupational Stress Questionnaire    Feeling of Stress : Only a little  Social Connections: Moderately Integrated (07/03/2023)   Social Connection and Isolation Panel [NHANES]    Frequency of Communication with Friends and Family: Three times a  week    Frequency of Social Gatherings with Friends and Family: Twice a week    Attends Religious Services: More than 4 times per year    Active Member of [employer] or Organizations: No    Attends Banker Meetings: Never    Marital Status: Married  Catering manager Violence: Not At Risk (07/03/2023)   Humiliation, Afraid, Rape, and Kick questionnaire    Fear of Current or Ex-Partner: No    Emotionally Abused: No    Physically Abused: No    Sexually Abused: No    FAMILY HISTORY: Family History  Problem Relation Age of Onset   Stroke Mother    Hypertension Mother    Aneurysm Father        cerebral   Hypertension Father    Heart disease Paternal Uncle    Alcohol abuse Brother    Cancer Neg Hx    COPD Neg Hx  Diabetes Neg Hx     ALLERGIES:  is allergic to flomax [tamsulosin hcl], ace inhibitors, amlodipine, doxazosin, and lipitor [atorvastatin].  MEDICATIONS:  Current Outpatient Medications  Medication Sig Dispense Refill   acetaminophen (TYLENOL) 650 MG CR tablet Take 2 tablets (1,300 mg total) by mouth every 12 (twelve) hours as needed (Mild pain).     aspirin EC 81 MG tablet Take 1 tablet (81 mg total) by mouth daily at 6 (six) AM. Swallow whole. 30 tablet 11   cloNIDine (CATAPRES) 0.2 MG tablet TAKE 1 TABLET(0.2 MG) BY MOUTH TWICE DAILY 180 tablet 0   clopidogrel (PLAVIX) 75 MG tablet Take 1 tablet (75 mg total) by mouth daily. 30 tablet 11   cyanocobalamin (VITAMIN B12) 1000 MCG tablet Take 1,000 mcg by mouth daily.     diclofenac Sodium (VOLTAREN) 1 % GEL Apply 2 g topically 4 (four) times daily. 400 g 0   hydrALAZINE (APRESOLINE) 10 MG tablet TAKE 1 TABLET BY MOUTH TWICE DAILY AS NEEDED, TAKE IF BLOOD PRESSURE GREATER THEN 140/90 180 tablet 0   labetalol (NORMODYNE) 100 MG tablet 100 mg 2 (two) times daily.     magnesium oxide (MAG-OX) 400 MG tablet 1 tablet 500mg  Orally Once a day     pravastatin (PRAVACHOL) 80 MG tablet TAKE 1 TABLET(80 MG) BY MOUTH AT  BEDTIME 90 tablet 2   torsemide (DEMADEX) 20 MG tablet Take 1 tablet (20 mg total) by mouth 2 (two) times daily. 180 tablet 1   valsartan (DIOVAN) 160 MG tablet Take 1 tablet (160 mg total) by mouth daily.     apixaban (ELIQUIS) 2.5 MG TABS tablet Take 1 tablet (2.5 mg total) by mouth 2 (two) times daily. (Patient not taking: Reported on 08/07/2023) 60 tablet 3   ferrous sulfate 325 (65 FE) MG EC tablet Take 1 tablet (325 mg total) by mouth daily with breakfast. (Patient not taking: Reported on 08/07/2023) 30 tablet 3   metoprolol tartrate (LOPRESSOR) 100 MG tablet Take 1 tablet (100mg ) TWO hours prior to CT scan (Patient not taking: Reported on 08/07/2023) 1 tablet 0   No current facility-administered medications for this visit.    PHYSICAL EXAMINATION: Vitals:   08/07/23 1329  BP: (!) 131/59  Pulse: 78  Temp: (!) 97.3 F (36.3 C)  SpO2: 100%   Filed Weights   08/07/23 1329  Weight: 234 lb (106.1 kg)    Physical Exam Vitals reviewed.  Constitutional:      Comments: unaccompanied  HENT:     Head: Normocephalic and atraumatic.  Cardiovascular:     Rate and Rhythm: Normal rate and regular rhythm.  Pulmonary:     Comments: Decreased breath sounds bilaterally.  Abdominal:     General: There is no distension.     Palpations: Abdomen is soft.  Musculoskeletal:     Cervical back: Normal range of motion.     Comments: Cane for ambulation  Skin:    General: Skin is warm.     Coloration: Skin is not pale.  Neurological:     Mental Status: He is alert and oriented to person, place, and time.  Psychiatric:        Mood and Affect: Mood normal.        Behavior: Behavior normal.    LABORATORY DATA:  I have reviewed the data as listed Lab Results  Component Value Date   WBC 6.9 08/07/2023   HGB 11.8 (L) 08/07/2023   HCT 36.3 (L) 08/07/2023   MCV 85.6 08/07/2023  PLT 315 08/07/2023   Recent Labs    08/24/22 0347 08/24/22 0557 09/15/22 1006 11/26/22 0955 01/29/23 1016  03/31/23 1055 04/27/23 0521 04/30/23 1441 05/02/23 0943 05/12/23 1449 08/07/23 1313  NA  --    < > 132*   < > 137 138 135 136 132*  --  138  K  --    < > 3.7   < > 3.9 3.9 3.7 4.4 3.7  --  4.0  CL  --    < > 105   < > 103 101 98 99 102  --  102  CO2  --    < > 24   < > 24 27 26 26  21*  --  23  GLUCOSE  --    < > 97   < > 131* 140* 111* 91 146*  --  135*  BUN  --    < > 12   < > 22 20 35* 24 26*  --  23  CREATININE  --    < > 1.16   < > 1.40* 1.65* 1.80* 1.83* 1.83* 2.20* 1.78*  CALCIUM  --    < > 9.3   < > 8.1* 10.1 9.1 9.9 9.3  --  9.6  GFRNONAA  --    < > >60   < > 51*  --  38*  --  37*  --  39*  PROT 7.2   < > 7.1   < > 6.9 7.2  --  7.0 7.5  --   --   ALBUMIN 3.9   < > 3.8  --  4.1  --   --   --  4.4  --   --   AST 17   < > 17   < > 24 20  --  14 24  --   --   ALT 9   < > 12   < > 15 14  --  11 15  --   --   ALKPHOS 50   < > 81  --  58  --   --   --  50  --   --   BILITOT 0.9   < > 0.5   < > 0.2* 0.4  --  0.3 0.3  --   --   BILIDIR 0.3*  --   --   --   --   --   --   --   --   --   --   IBILI 0.6  --   --   --   --   --   --   --   --   --   --    < > = values in this interval not displayed.  Iron/TIBC/Ferritin/ %Sat    Component Value Date/Time   IRON 38 (L) 05/02/2023 0943   TIBC 462 (H) 05/02/2023 0943   FERRITIN 8 (L) 05/02/2023 0943   IRONPCTSAT 8 (L) 05/02/2023 0943   IRONPCTSAT 14 (L) 04/30/2023 1441     RADIOGRAPHIC STUDIES: I have personally reviewed the radiological images as listed and agreed with the findings in the report. No results found.  ASSESSMENT & PLAN:   Acute pulmonary embolism without acute cor pulmonale  # JAN 7th, 2024- Unprovoked- Acute pulmonary embolus within segmental branch of the right upper lobe pulmonary artery and segmental branch of the left lower lobe pulmonary artery. On Eliquis; CTA SEP 2024- NO acute PE. Eliquis 5 mg BID x57m;  Currently on eliquis 2.5 mg BID; I would recommend anticoagulation for 12 months.     # Mild anemia Hb  9-10- ? Sec to CKD-/IDA- OFF gentle iron [iron biglycinate; 28 mg- constipation]. Last received venofer 06/25/23. Tolerated well. Hmg has improved to 11.8, more consistent with baseline. Ferritin and iron studies pending at time of visit. Proceed with venofer today. Additional doses based on iron stores.   # Cerebellar stroke- JAN 2024 [facility]; Dr.Shah- AUG 2023]- S/p right carotid stenting- [april 2024; Dr.Schneir]; Also noted to have severe, bilateral basilar artery occlusion, occluded left vertebral artery, moderate right vertebral artery stenosis- [Dr Zomorodi at [facility] neurosurgery [oct 2024]- conservative management. On Asprin Sec to possible Chronic GI bleed.    # Bil LE swelling/edema- recommend continue lasix/ and bil LE stockings- stable.    # Poorly controlled BP-better today. Stable.    # CKD [Dr.Kolluru]- III - recommend avoiding nephrotoxic NSAIDs-Advil/Motrin- Stable.    # DISPOSITION: Venofer today 3 mo- lab, Dr Donneta Romberg, +/- venofer- la  No problem-specific Assessment & Plan notes found for this encounter.  All questions were answered. The patient knows to call the clinic with any problems, questions or concerns.    Alinda Dooms, NP 08/07/2023

## 2023-08-07 NOTE — Telephone Encounter (Signed)
Requested Prescriptions  Pending Prescriptions Disp Refills   hydrALAZINE (APRESOLINE) 10 MG tablet [Pharmacy Med Name: HYDRALAZINE 10 MG TABLETS (ORANGE)] 180 tablet 0    Sig: TAKE 1 TABLET BY MOUTH TWICE DAILY AS NEEDED, TAKE IF BLOOD PRESSURE GREATER THEN 140/90     Cardiovascular:  Vasodilators Failed - 08/07/2023 12:34 PM      Failed - HCT in normal range and within 360 days    HCT  Date Value Ref Range Status  06/04/2023 30.5 (L) 39.0 - 52.0 % Final   Hematocrit  Date Value Ref Range Status  10/19/2015 37.6 37.5 - 51.0 % Final         Failed - HGB in normal range and within 360 days    Hemoglobin  Date Value Ref Range Status  06/04/2023 9.5 (L) 13.0 - 17.0 g/dL Final  16/05/9603 54.0 (L) 12.6 - 17.7 g/dL Final         Failed - RBC in normal range and within 360 days    RBC  Date Value Ref Range Status  06/04/2023 3.57 (L) 4.22 - 5.81 MIL/uL Final         Failed - ANA Screen, Ifa, Serum in normal range and within 360 days    No results found for: "ANA", "ANATITER", "LABANTI"       Failed - Last BP in normal range    BP Readings from Last 1 Encounters:  07/07/23 (!) 171/66         Passed - WBC in normal range and within 360 days    WBC  Date Value Ref Range Status  04/30/2023 8.7 3.8 - 10.8 Thousand/uL Final   WBC Count  Date Value Ref Range Status  06/04/2023 5.5 4.0 - 10.5 K/uL Final         Passed - PLT in normal range and within 360 days    Platelets  Date Value Ref Range Status  10/19/2015 343 150 - 379 x10E3/uL Final   Platelet Count  Date Value Ref Range Status  06/04/2023 283 150 - 400 K/uL Final         Passed - Valid encounter within last 12 months    Recent Outpatient Visits           3 months ago Chest pain, unspecified type   [facility] Berniece Salines, FNP   4 months ago Lower extremity edema   [facility] Berniece Salines, FNP   4 months ago Lower extremity edema   [facility] Margarita Mail, DO   6 months ago Primary hypertension   [facility] Berniece Salines, FNP   7 months ago Periumbilical abdominal pain   [facility] Mecum, Oswaldo Conroy, PA-C       Future Appointments             In 2 months Richardo Hanks, Laurette Schimke, MD [facility] [place]

## 2023-09-22 ENCOUNTER — Telehealth: Payer: Self-pay | Admitting: *Deleted

## 2023-09-22 DIAGNOSIS — I119 Hypertensive heart disease without heart failure: Secondary | ICD-10-CM | POA: Diagnosis not present

## 2023-09-22 DIAGNOSIS — I2510 Atherosclerotic heart disease of native coronary artery without angina pectoris: Secondary | ICD-10-CM | POA: Diagnosis not present

## 2023-09-22 DIAGNOSIS — N1832 Chronic kidney disease, stage 3b: Secondary | ICD-10-CM | POA: Diagnosis not present

## 2023-09-22 DIAGNOSIS — G4733 Obstructive sleep apnea (adult) (pediatric): Secondary | ICD-10-CM | POA: Diagnosis not present

## 2023-09-23 ENCOUNTER — Ambulatory Visit (INDEPENDENT_AMBULATORY_CARE_PROVIDER_SITE_OTHER): Payer: Medicare Other | Admitting: Nurse Practitioner

## 2023-09-23 ENCOUNTER — Encounter: Payer: Self-pay | Admitting: Nurse Practitioner

## 2023-09-23 VITALS — BP 128/68 | HR 85 | Temp 98.2°F | Resp 18 | Ht 72.0 in | Wt 238.6 lb

## 2023-09-23 DIAGNOSIS — Z8546 Personal history of malignant neoplasm of prostate: Secondary | ICD-10-CM

## 2023-09-23 DIAGNOSIS — R7309 Other abnormal glucose: Secondary | ICD-10-CM

## 2023-09-23 DIAGNOSIS — G4733 Obstructive sleep apnea (adult) (pediatric): Secondary | ICD-10-CM

## 2023-09-23 DIAGNOSIS — E782 Mixed hyperlipidemia: Secondary | ICD-10-CM | POA: Diagnosis not present

## 2023-09-23 DIAGNOSIS — N1832 Chronic kidney disease, stage 3b: Secondary | ICD-10-CM | POA: Diagnosis not present

## 2023-09-23 DIAGNOSIS — I739 Peripheral vascular disease, unspecified: Secondary | ICD-10-CM

## 2023-09-23 DIAGNOSIS — I10 Essential (primary) hypertension: Secondary | ICD-10-CM | POA: Diagnosis not present

## 2023-09-23 DIAGNOSIS — I5032 Chronic diastolic (congestive) heart failure: Secondary | ICD-10-CM | POA: Diagnosis not present

## 2023-09-23 DIAGNOSIS — I2699 Other pulmonary embolism without acute cor pulmonale: Secondary | ICD-10-CM

## 2023-09-23 DIAGNOSIS — C61 Malignant neoplasm of prostate: Secondary | ICD-10-CM | POA: Insufficient documentation

## 2023-09-23 DIAGNOSIS — I25118 Atherosclerotic heart disease of native coronary artery with other forms of angina pectoris: Secondary | ICD-10-CM

## 2023-09-23 DIAGNOSIS — N1831 Chronic kidney disease, stage 3a: Secondary | ICD-10-CM

## 2023-09-23 DIAGNOSIS — D631 Anemia in chronic kidney disease: Secondary | ICD-10-CM

## 2023-09-23 DIAGNOSIS — Z8673 Personal history of transient ischemic attack (TIA), and cerebral infarction without residual deficits: Secondary | ICD-10-CM

## 2023-09-23 DIAGNOSIS — E669 Obesity, unspecified: Secondary | ICD-10-CM

## 2023-09-23 DIAGNOSIS — M1990 Unspecified osteoarthritis, unspecified site: Secondary | ICD-10-CM

## 2023-09-23 DIAGNOSIS — I700 Atherosclerosis of aorta: Secondary | ICD-10-CM

## 2023-09-23 MED ORDER — DICLOFENAC SODIUM 1 % EX GEL: 2.0000 g | Freq: Four times a day (QID) | CUTANEOUS | 5 refills | Status: AC

## 2023-09-23 NOTE — Progress Notes (Signed)
 BP 128/68   Pulse 85   Temp 98.2 F (36.8 C)   Resp 18   Ht 6' (1.829 m)   Wt 238 lb 9.6 oz (108.2 kg)   SpO2 99%   BMI 32.36 kg/m    Subjective:    Patient ID: Jared Cline, male    DOB: June 09, 1946, 78 y.o.   MRN: [id number]  HPI: Jared Cline is a 78 y.o. male  Chief Complaint  Patient presents with   Medical Management of Chronic Issues    Discussed the use of AI scribe software for clinical note transcription with the patient, who gave verbal consent to proceed.  History of Present Illness   The patient, with a history of heart failure, prostate cancer, anemia with chronic kidney disease, peripheral artery disease, hypertension, coronary artery disease, aortic atherosclerosis, sleep apnea, obesity, hyperlipidemia, history of CVA, and history of PE, presents with worsening arthritis in the knees. The patient reports that the arthritis has been progressively worsening, particularly in cold weather, and has been causing significant discomfort. The patient has been using Voltaren  gel for pain management, which he reports as effective.  The patient also reports balance issues, particularly when standing up quickly, which he attributes to a previous stroke. The patient's partner confirms that the patient's balance is off and that he is weak on his left side. The patient reports that his therapists have advised that recovery from a stroke can take two years or longer. The patient is aware of this and is patient with his progress, stating I'm getting there. Slow but sure.  The patient also mentions a recent visit to a vein doctor, during which an ultrasound was performed. The results were positive, with no clogged veins found. The patient also reports a recent visit to a heart doctor, during which multiple tests were performed. The patient does not report any new or worsening cardiac symptoms.       07/03/2023    9:04 AM 04/30/2023    2:16 PM 03/31/2023   10:21 AM  Depression screen  PHQ 2/9  Decreased Interest 0 0 0  Down, Depressed, Hopeless 0 0 0  PHQ - 2 Score 0 0 0  Altered sleeping 0 0   Tired, decreased energy 0 0   Change in appetite 0 0   Feeling bad or failure about yourself  0 0   Trouble concentrating 0 0   Moving slowly or fidgety/restless 0 0   Suicidal thoughts 0 0   PHQ-9 Score 0 0   Difficult doing work/chores Not difficult at all      Relevant past medical, surgical, family and social history reviewed and updated as indicated. Interim medical history since our last visit reviewed. Allergies and medications reviewed and updated.  Review of Systems  Constitutional: Negative for fever or weight change.  Respiratory: Negative for cough and shortness of breath.   Cardiovascular: Negative for chest pain or palpitations.  Gastrointestinal: Negative for abdominal pain, no bowel changes.  Musculoskeletal: Negative for gait problem or joint swelling.  Skin: Negative for rash.  Neurological: Negative for dizziness or headache.  No other specific complaints in a complete review of systems (except as listed in HPI above).      Objective:    BP 128/68   Pulse 85   Temp 98.2 F (36.8 C)   Resp 18   Ht 6' (1.829 m)   Wt 238 lb 9.6 oz (108.2 kg)   SpO2 99%   BMI 32.36 kg/m  BP Readings from Last 3 Encounters:  09/23/23 128/68  08/07/23 (!) 140/60  08/07/23 (!) 131/59     Wt Readings from Last 3 Encounters:  09/23/23 238 lb 9.6 oz (108.2 kg)  08/07/23 234 lb (106.1 kg)  07/07/23 235 lb 3.2 oz (106.7 kg)    Physical Exam Vitals reviewed.  Constitutional:      Appearance: Normal appearance.  HENT:     Head: Normocephalic.  Cardiovascular:     Rate and Rhythm: Normal rate and regular rhythm.  Pulmonary:     Effort: Pulmonary effort is normal.     Breath sounds: Normal breath sounds.  Musculoskeletal:        General: Normal range of motion.  Skin:    General: Skin is warm and dry.  Neurological:     General: No focal deficit  present.     Mental Status: He is alert and oriented to person, place, and time. Mental status is at baseline.  Psychiatric:        Mood and Affect: Mood normal.        Behavior: Behavior normal.        Thought Content: Thought content normal.        Judgment: Judgment normal.     Results for orders placed or performed in visit on 08/07/23  Ferritin   Collection Time: 08/07/23  1:13 PM  Result Value Ref Range   Ferritin 87 24 - 336 ng/mL  Iron  and TIBC   Collection Time: 08/07/23  1:13 PM  Result Value Ref Range   Iron  43 (L) 45 - 182 ug/dL   TIBC 659 749 - 549 ug/dL   Saturation Ratios 13 (L) 17.9 - 39.5 %   UIBC 297 ug/dL  Basic metabolic panel   Collection Time: 08/07/23  1:13 PM  Result Value Ref Range   Sodium 138 135 - 145 mmol/L   Potassium 4.0 3.5 - 5.1 mmol/L   Chloride 102 98 - 111 mmol/L   CO2 23 22 - 32 mmol/L   Glucose, Bld 135 (H) 70 - 99 mg/dL   BUN 23 8 - 23 mg/dL   Creatinine, Ser 8.21 (H) 0.61 - 1.24 mg/dL   Calcium  9.6 8.9 - 10.3 mg/dL   GFR, Estimated 39 (L) >60 mL/min   Anion gap 13 5 - 15  CBC with Differential (Cancer Center Only)   Collection Time: 08/07/23  1:13 PM  Result Value Ref Range   WBC Count 6.9 4.0 - 10.5 K/uL   RBC 4.24 4.22 - 5.81 MIL/uL   Hemoglobin 11.8 (L) 13.0 - 17.0 g/dL   HCT 63.6 (L) 60.9 - 47.9 %   MCV 85.6 80.0 - 100.0 fL   MCH 27.8 26.0 - 34.0 pg   MCHC 32.5 30.0 - 36.0 g/dL   RDW 86.4 88.4 - 84.4 %   Platelet Count 315 150 - 400 K/uL   nRBC 0.0 0.0 - 0.2 %   Neutrophils Relative % 66 %   Neutro Abs 4.6 1.7 - 7.7 K/uL   Lymphocytes Relative 26 %   Lymphs Abs 1.8 0.7 - 4.0 K/uL   Monocytes Relative 6 %   Monocytes Absolute 0.4 0.1 - 1.0 K/uL   Eosinophils Relative 1 %   Eosinophils Absolute 0.1 0.0 - 0.5 K/uL   Basophils Relative 1 %   Basophils Absolute 0.1 0.0 - 0.1 K/uL   Immature Granulocytes 0 %   Abs Immature Granulocytes 0.03 0.00 - 0.07 K/uL   Last CBC Lab  Results  Component Value Date   WBC 6.9  08/07/2023   HGB 11.8 (L) 08/07/2023   HCT 36.3 (L) 08/07/2023   MCV 85.6 08/07/2023   MCH 27.8 08/07/2023   RDW 13.5 08/07/2023   PLT 315 08/07/2023   Last metabolic panel Lab Results  Component Value Date   GLUCOSE 135 (H) 08/07/2023   NA 138 08/07/2023   K 4.0 08/07/2023   CL 102 08/07/2023   CO2 23 08/07/2023   BUN 23 08/07/2023   CREATININE 1.78 (H) 08/07/2023   GFRNONAA 39 (L) 08/07/2023   CALCIUM  9.6 08/07/2023   PROT 7.5 05/02/2023   ALBUMIN 4.4 05/02/2023   LABGLOB 2.6 10/19/2015   AGRATIO 1.7 10/19/2015   BILITOT 0.3 05/02/2023   ALKPHOS 50 05/02/2023   AST 24 05/02/2023   ALT 15 05/02/2023   ANIONGAP 13 08/07/2023        Assessment & Plan:   Problem List Items Addressed This Visit       Cardiovascular and Mediastinum   Hypertension (Chronic)   Relevant Orders   COMPLETE METABOLIC PANEL WITH GFR   CBC with Differential/Platelet   PAD (peripheral artery disease) (HCC)   Relevant Orders   Lipid panel   Coronary artery disease of native artery of native heart with stable angina pectoris (HCC)   Relevant Orders   Lipid panel   Aortic atherosclerosis (HCC)   Relevant Orders   Lipid panel   Diastolic CHF (HCC) - Primary     Respiratory   OSA on CPAP     Musculoskeletal and Integument   Arthritis   Relevant Medications   diclofenac  Sodium (VOLTAREN ) 1 % GEL     Genitourinary   Stage 3a chronic kidney disease (HCC)   Relevant Orders   COMPLETE METABOLIC PANEL WITH GFR   Anemia in stage 3b chronic kidney disease (HCC)   Relevant Orders   CBC with Differential/Platelet     Other   Hyperlipidemia (Chronic)   Relevant Orders   Lipid panel   History of prostate cancer   History of CVA (cerebrovascular accident)   Obesity (BMI 30-39.9)   Other Visit Diagnoses       Elevated glucose       Relevant Orders   Hemoglobin A1c     PE (pulmonary thromboembolism) (HCC)            Assessment and Plan    Arthritis Complaints of worsening knee  pain, possibly exacerbated by cold weather. Currently using Voltaren  gel for symptom management. -Refill Voltaren  gel prescription.  History of Pulmonary Embolism No current symptoms. Not currently on Eliquis , only on Plavix . -Continue Plavix  75mg  daily.  Post-Stroke Sequelae Reports occasional dizziness upon standing quickly and imbalance. No recent acute issues. -Advised to stand slowly to prevent dizziness.  Cardiovascular Disease/heart failure, hypertension, coronary artery disease, aortic atherosclerosis, PAD and history of CVA. History of heart failure, hypertension, coronary artery disease, aortic atherosclerosis, and history of CVA. No acute complaints. -Continue current medications:Aspirin  81mg  daily, Clonidine  0.2mg  twice daily, Hydralazine  10mg  twice daily as needed for BP >140/90, Labetalol  100mg  twice daily, Pravastatin  80mg  daily, Torsemide  20mg  twice daily, Valsartan  160mg  daily.   sleep apnea -uses cpap machine   anemia -followed by hematology -due to CKD   CKD -followed by nephrology -avoid NSAIDs   history of prostate cancer -history of prostatectomy    elevated glucose - elevated glucose reading on labs done in december will get A1C     General Health Maintenance -Order blood  work to monitor overall health status. -Follow-up on results of recent ultrasound for vein evaluation.       Obesity Work on lifestyle modification Weight today is 238 lbs with a bmi of 32.36 Comorbdities include htn, hld, ckd, elevated glucose  Follow up plan: Return in about 6 months (around 03/22/2024) for follow up.

## 2023-09-24 LAB — LIPID PANEL
Cholesterol: 147 mg/dL (ref <200)
HDL: 49 mg/dL (ref >=40)
LDL Cholesterol (Calc): 80 mg/dL
Non-HDL Cholesterol (Calc): 98 mg/dL (ref <130)
Total CHOL/HDL Ratio: 3 (calc) (ref <5.0)
Triglycerides: 97 mg/dL (ref <150)

## 2023-09-24 LAB — CBC WITH DIFFERENTIAL/PLATELET
Absolute Lymphocytes: 1360 {cells}/uL (ref 850–3900)
Absolute Monocytes: 365 {cells}/uL (ref 200–950)
Basophils Absolute: 40 {cells}/uL (ref 0–200)
Basophils Relative: 0.9 %
Eosinophils Absolute: 128 {cells}/uL (ref 15–500)
Eosinophils Relative: 2.9 %
HCT: 36.3 % — ABNORMAL LOW (ref 38.5–50.0)
Hemoglobin: 11.6 g/dL — ABNORMAL LOW (ref 13.2–17.1)
MCH: 28.1 pg (ref 27.0–33.0)
MCHC: 32 g/dL (ref 32.0–36.0)
MCV: 87.9 fL (ref 80.0–100.0)
MPV: 10.2 fL (ref 7.5–12.5)
Monocytes Relative: 8.3 %
Neutro Abs: 2508 {cells}/uL (ref 1500–7800)
Neutrophils Relative %: 57 %
Platelets: 288 10*3/uL (ref 140–400)
RBC: 4.13 10*6/uL — ABNORMAL LOW (ref 4.20–5.80)
RDW: 12.7 % (ref 11.0–15.0)
Total Lymphocyte: 30.9 %
WBC: 4.4 10*3/uL (ref 3.8–10.8)

## 2023-09-24 LAB — COMPLETE METABOLIC PANEL WITH GFR
AG Ratio: 1.8 (calc) (ref 1.0–2.5)
ALT: 11 U/L (ref 9–46)
AST: 16 U/L (ref 10–35)
Albumin: 4.4 g/dL (ref 3.6–5.1)
Alkaline phosphatase (APISO): 62 U/L (ref 35–144)
BUN/Creatinine Ratio: 11 (calc) (ref 6–22)
BUN: 16 mg/dL (ref 7–25)
CO2: 24 mmol/L (ref 20–32)
Calcium: 9.8 mg/dL (ref 8.6–10.3)
Chloride: 106 mmol/L (ref 98–110)
Creat: 1.51 mg/dL — ABNORMAL HIGH (ref 0.70–1.28)
Globulin: 2.4 g/dL (ref 1.9–3.7)
Glucose, Bld: 94 mg/dL (ref 65–99)
Potassium: 4.4 mmol/L (ref 3.5–5.3)
Sodium: 138 mmol/L (ref 135–146)
Total Bilirubin: 0.4 mg/dL (ref 0.2–1.2)
Total Protein: 6.8 g/dL (ref 6.1–8.1)
eGFR: 47 mL/min/{1.73_m2} — ABNORMAL LOW (ref >=60)

## 2023-09-24 LAB — HEMOGLOBIN A1C
Hgb A1c MFr Bld: 6 %{Hb} — ABNORMAL HIGH (ref <5.7)
Mean Plasma Glucose: 126 mg/dL
eAG (mmol/L): 7 mmol/L

## 2023-10-07 DIAGNOSIS — K5909 Other constipation: Secondary | ICD-10-CM | POA: Diagnosis not present

## 2023-10-07 DIAGNOSIS — D500 Iron deficiency anemia secondary to blood loss (chronic): Secondary | ICD-10-CM | POA: Diagnosis not present

## 2023-10-07 DIAGNOSIS — R140 Abdominal distension (gaseous): Secondary | ICD-10-CM | POA: Diagnosis not present

## 2023-10-07 DIAGNOSIS — R1319 Other dysphagia: Secondary | ICD-10-CM | POA: Diagnosis not present

## 2023-10-08 ENCOUNTER — Other Ambulatory Visit (INDEPENDENT_AMBULATORY_CARE_PROVIDER_SITE_OTHER): Payer: Self-pay | Admitting: Vascular Surgery

## 2023-10-15 ENCOUNTER — Emergency Department: Payer: Medicare Other

## 2023-10-15 ENCOUNTER — Ambulatory Visit: Payer: Self-pay | Admitting: Nurse Practitioner

## 2023-10-15 ENCOUNTER — Emergency Department
Admission: EM | Admit: 2023-10-15 | Discharge: 2023-10-15 | Disposition: A | Discharge status: Home / Self Care | Payer: Medicare Other | Attending: Emergency Medicine | Admitting: Emergency Medicine

## 2023-10-15 ENCOUNTER — Other Ambulatory Visit: Payer: Self-pay

## 2023-10-15 ENCOUNTER — Other Ambulatory Visit: Payer: Self-pay | Admitting: Nurse Practitioner

## 2023-10-15 DIAGNOSIS — K5900 Constipation, unspecified: Secondary | ICD-10-CM | POA: Insufficient documentation

## 2023-10-15 DIAGNOSIS — R1033 Periumbilical pain: Secondary | ICD-10-CM

## 2023-10-15 DIAGNOSIS — N200 Calculus of kidney: Secondary | ICD-10-CM | POA: Diagnosis not present

## 2023-10-15 DIAGNOSIS — N281 Cyst of kidney, acquired: Secondary | ICD-10-CM | POA: Diagnosis not present

## 2023-10-15 DIAGNOSIS — R1030 Lower abdominal pain, unspecified: Secondary | ICD-10-CM | POA: Diagnosis not present

## 2023-10-15 DIAGNOSIS — K449 Diaphragmatic hernia without obstruction or gangrene: Secondary | ICD-10-CM | POA: Diagnosis not present

## 2023-10-15 DIAGNOSIS — I10 Essential (primary) hypertension: Secondary | ICD-10-CM

## 2023-10-15 DIAGNOSIS — R600 Localized edema: Secondary | ICD-10-CM

## 2023-10-15 LAB — CBC
HCT: 36.3 % — ABNORMAL LOW (ref 39.0–52.0)
Hemoglobin: 11.8 g/dL — ABNORMAL LOW (ref 13.0–17.0)
MCH: 28.4 pg (ref 26.0–34.0)
MCHC: 32.5 g/dL (ref 30.0–36.0)
MCV: 87.3 fL (ref 80.0–100.0)
Platelets: 329 10*3/uL (ref 150–400)
RBC: 4.16 MIL/uL — ABNORMAL LOW (ref 4.22–5.81)
RDW: 12.5 % (ref 11.5–15.5)
WBC: 5.5 10*3/uL (ref 4.0–10.5)
nRBC: 0 % (ref 0.0–0.2)

## 2023-10-15 LAB — COMPREHENSIVE METABOLIC PANEL
ALT: 16 U/L (ref 0–44)
AST: 22 U/L (ref 15–41)
Albumin: 4.2 g/dL (ref 3.5–5.0)
Alkaline Phosphatase: 54 U/L (ref 38–126)
Anion gap: 8 (ref 5–15)
BUN: 18 mg/dL (ref 8–23)
CO2: 26 mmol/L (ref 22–32)
Calcium: 9.7 mg/dL (ref 8.9–10.3)
Chloride: 103 mmol/L (ref 98–111)
Creatinine, Ser: 1.48 mg/dL — ABNORMAL HIGH (ref 0.61–1.24)
GFR, Estimated: 48 mL/min — ABNORMAL LOW (ref >60)
Glucose, Bld: 110 mg/dL — ABNORMAL HIGH (ref 70–99)
Potassium: 4.2 mmol/L (ref 3.5–5.1)
Sodium: 137 mmol/L (ref 135–145)
Total Bilirubin: 0.5 mg/dL (ref 0.0–1.2)
Total Protein: 7.4 g/dL (ref 6.5–8.1)

## 2023-10-15 LAB — LIPASE, BLOOD: Lipase: 26 U/L (ref 11–51)

## 2023-10-15 LAB — URINALYSIS, ROUTINE W REFLEX MICROSCOPIC
Bilirubin Urine: NEGATIVE
Glucose, UA: NEGATIVE mg/dL
Hgb urine dipstick: NEGATIVE
Ketones, ur: NEGATIVE mg/dL
Leukocytes,Ua: NEGATIVE
Nitrite: NEGATIVE
Protein, ur: NEGATIVE mg/dL
Specific Gravity, Urine: 1.008 (ref 1.005–1.030)
pH: 6 (ref 5.0–8.0)

## 2023-10-15 MED ORDER — IOHEXOL 300 MG/ML  SOLN
100.0000 mL | Freq: Once | INTRAMUSCULAR | Status: AC | PRN
  Administered 2023-10-15: 100 mL via INTRAVENOUS

## 2023-10-15 NOTE — ED Provider Notes (Signed)
 CT without acute features.  Signs of constipation.  Suitable for outpatient management.  Discussed OTC medications, expectant management and ED return precautions.   Delton Prairie, MD 10/15/23 [phone]

## 2023-10-15 NOTE — Telephone Encounter (Signed)
 Copied from CRM [id number]. Topic: Clinical - Red Word Triage >> Oct 15, 2023  9:44 AM Gildardo Pounds wrote: Red Word that prompted transfer to Nurse Triage: blood pressure is 162/132. Callback  number [phone]  Chief Complaint: elevated BP Symptoms: elevated BP, abdominal pain 8/10 Frequency: constant Pertinent Negatives: Patient denies blurred vision, chest pain, difficulty breathing, headache, weakness, numbness, confusion Disposition: [x] 911 / [] ED /[] Urgent Care (no appt availability in office) / [] Appointment(In office/virtual)/ []  [place]/ [] Home Care/ [] Refused Recommended Disposition /[] [facility]/ []  Follow-up with PCP Additional Notes: Pt wife reporting pt having elevated BP's today in 280s/130s max, including systolics of 280 and 232 earlier today, then 162/132, and "stomach is hurting real bad 8/10 right at bottom of stomach down below belly button." Nurse asking for repeat BP, wife taking pt BP, reading is 240/134 at 9:51 am. Pt denies all other symptoms, but confirms this morning "got up and pain in me something terrible, was real severe when first got up this morning, 10/10" at lower abdomen "throbbing, hurting pain." Pt and wife confirm taking BP meds on time, confirm significant cardiac hx and hx of 2 strokes. Advised call 911 for symptoms, wife asking if she could take him to ED, nurse advised strongly that ambulance come for pt to have most immediate assessment and care available, agreed for nurse to call 911 for them. Called 911 for pt, ambulance on way. Stayed on line with pt until EMS arrived, pt confirms no worsening or new symptoms. EMS talking with pt, nurse disconnected.  Reason for Disposition  Sounds like a life-threatening emergency to the triager  Answer Assessment - Initial Assessment Questions 1. BLOOD PRESSURE: "What is the blood pressure?" "Did you take at least two measurements 5 minutes apart?"     This morning 280/something, now just  checked it 162/132 2. ONSET: "When did you take your blood pressure?"     When got up this morning 3. HOW: "How did you take your blood pressure?" (e.g., automatic home BP monitor, visiting nurse)     At home BP cuff 4. HISTORY: "Do you have a history of high blood pressure?"     Yes, hx of strokes also 5. MEDICINES: "Are you taking any medicines for blood pressure?" "Have you missed any doses recently?"     No 6. OTHER SYMPTOMS: "Do you have any symptoms?" (e.g., blurred vision, chest pain, difficulty breathing, headache, weakness)     Denies, stomach is hurting real bad 8/10 right at bottom of stomach down below belly button  Protocols used: Blood Pressure - High-A-AH

## 2023-10-15 NOTE — ED Notes (Signed)
 Patient discharged from ED by provider. Discharge instructions reviewed with patient by provider and all questions answered. Patient wheeled from ED in NAD.

## 2023-10-15 NOTE — ED Provider Notes (Signed)
 [facility] Provider Note    Event Date/Time   First MD Initiated Contact with Patient 10/15/23 1204     (approximate)   History   Abdominal Pain   HPI Jared Cline is a 78 y.o. male with history of chronic constipation presenting today for sharp lower abdominal pain.  Patient notes onset of sharp centralized and lower abdominal pain this morning.  He denies any nausea or vomiting.  Denies chest pain, cough, shortness of breath, fevers, dysuria, hematuria.  States he has not had a bowel movement in 3 to 4 days.  Has tried Gas-X and Imodium without significant relief.  No prior abdominal surgeries.     Physical Exam   Triage Vital Signs: ED Triage Vitals  Encounter Vitals Group     BP 10/15/23 1114 (!) 181/60     Systolic BP Percentile --      Diastolic BP Percentile --      Pulse Rate 10/15/23 1114 72     Resp 10/15/23 1114 18     Temp 10/15/23 1114 98.3 F (36.8 C)     Temp Source 10/15/23 1114 Oral     SpO2 10/15/23 1114 100 %     Weight 10/15/23 1113 236 lb (107 kg)     Height 10/15/23 1113 6' (1.829 m)     Head Circumference --      Peak Flow --      Pain Score 10/15/23 1113 9     Pain Loc --      Pain Education --      Exclude from Growth Chart --     Most recent vital signs: Vitals:   10/15/23 1114  BP: (!) 181/60  Pulse: 72  Resp: 18  Temp: 98.3 F (36.8 C)  SpO2: 100%   Physical Exam: I have reviewed the vital signs and nursing notes. General: Awake, alert, no acute distress.  Nontoxic appearing. Head:  Atraumatic, normocephalic.   ENT:  EOM intact, PERRL. Oral mucosa is pink and moist with no lesions. Neck: Neck is supple with full range of motion, No meningeal signs. Cardiovascular:  RRR, No murmurs. Peripheral pulses palpable and equal bilaterally. Respiratory:  Symmetrical chest wall expansion.  No rhonchi, rales, or wheezes.  Good air movement throughout.  No use of accessory muscles.   Musculoskeletal:  No cyanosis or  edema. Moving extremities with full ROM Abdomen:  Soft, sharp tenderness to palpation periumbilically, mild pain otherwise throughout, nondistended. Neuro:  GCS 15, moving all four extremities, interacting appropriately. Speech clear. Psych:  Calm, appropriate.   Skin:  Warm, dry, no rash.    ED Results / Procedures / Treatments   Labs (all labs ordered are listed, but only abnormal results are displayed) Labs Reviewed  COMPREHENSIVE METABOLIC PANEL - Abnormal; Notable for the following components:      Result Value   Glucose, Bld 110 (*)    Creatinine, Ser 1.48 (*)    GFR, Estimated 48 (*)    All other components within normal limits  CBC - Abnormal; Notable for the following components:   RBC 4.16 (*)    Hemoglobin 11.8 (*)    HCT 36.3 (*)    All other components within normal limits  URINALYSIS, ROUTINE W REFLEX MICROSCOPIC - Abnormal; Notable for the following components:   Color, Urine STRAW (*)    APPearance CLEAR (*)    All other components within normal limits  LIPASE, BLOOD     EKG  RADIOLOGY Independently interpreted CT abdomen/pelvis with no obvious sign of bowel obstruction or transition point.  Formal read pending at time of signout   PROCEDURES:  Critical Care performed: No  Procedures   MEDICATIONS ORDERED IN ED: Medications  iohexol (OMNIPAQUE) 300 MG/ML solution 100 mL (100 mLs Intravenous Contrast Given 10/15/23 1415)     IMPRESSION / MDM / ASSESSMENT AND PLAN / ED COURSE  I reviewed the triage vital signs and the nursing notes.                              Differential diagnosis includes, but is not limited to, SBO, constipation, diverticulitis, pancreatitis, colitis, enteritis  Patient's presentation is most consistent with acute complicated illness / injury requiring diagnostic workup.  Patient is a 78 year old male presenting today for periumbilical abdominal pain associated with no bowel movements in the past 4 days.  No other  systemic symptoms.  Vital signs stable.  Laboratory workup reassuring with unremarkable CBC, lipase, CMP, and UA.  CT abdomen/pelvis ordered for further evaluation.  My interpretation shows no obvious bowel obstruction or transition point.  Do not see significant stranding but will wait for formal radiology read before final decision.  Appears to be constipation with gaseous distention noted throughout.  Patient signed out oncoming provider pending results of CT.  If negative, patient can likely be discharged on bowel regimen.  Clinical Course as of 10/15/23 1522  Wed Oct 15, 2023  1229 Creatinine(!): 1.48 Creatinine consistent with baseline [DW]    Clinical Course User Index [DW] Janith Lima, MD     FINAL CLINICAL IMPRESSION(S) / ED DIAGNOSES   Final diagnoses:  Periumbilical abdominal pain     Rx / DC Orders   ED Discharge Orders     None        Note:  This document was prepared using Dragon voice recognition software and may include unintentional dictation errors.   Janith Lima, MD 10/15/23 [phone]

## 2023-10-15 NOTE — ED Triage Notes (Signed)
 Pt sts that he has been having abd naval pain and his bp has been going up and down. Pt sts that all of this just started this AM.

## 2023-10-15 NOTE — Discharge Instructions (Signed)
 MiraLAX for constipation.  1-2 capfuls per dose, 1-2 times per day  Drink plenty of water.  Return to the ED with any fevers or worsening symptoms despite these measures.

## 2023-10-16 ENCOUNTER — Telehealth: Payer: Self-pay

## 2023-10-16 NOTE — Transitions of Care (Post Inpatient/ED Visit) (Signed)
   10/16/2023  Name: Jared Cline MRN: [id number] DOB: 1946-08-05  Today's TOC FU Call Status: Today's TOC FU Call Status:: Unsuccessful Call (1st Attempt) Unsuccessful Call (1st Attempt) Date: 10/16/23  Attempted to reach the patient regarding the most recent Inpatient/ED visit.  Follow Up Plan: Additional outreach attempts will be made to reach the patient to complete the Transitions of Care (Post Inpatient/ED visit) call.   Signature Karena Addison, LPN [facility] Nurse Health Advisor Direct Dial [phone]

## 2023-10-16 NOTE — Telephone Encounter (Signed)
 Labs in date  Requested Prescriptions  Pending Prescriptions Disp Refills   torsemide (DEMADEX) 20 MG tablet [Pharmacy Med Name: TORSEMIDE 20MG  TABLETS] 180 tablet 0    Sig: TAKE 1 TABLET(20 MG) BY MOUTH TWICE DAILY     Cardiovascular:  Diuretics - Loop Failed - 10/16/2023  9:48 AM      Failed - Cr in normal range and within 180 days    Creat  Date Value Ref Range Status  09/23/2023 1.51 (H) 0.70 - 1.28 mg/dL Final   Creatinine, Ser  Date Value Ref Range Status  10/15/2023 1.48 (H) 0.61 - 1.24 mg/dL Final   Creatinine, Urine  Date Value Ref Range Status  05/02/2021 128 20 - 320 mg/dL Final         Failed - Mg Level in normal range and within 180 days    Magnesium  Date Value Ref Range Status  08/28/2022 2.2 1.7 - 2.4 mg/dL Final    Comment:    Performed at [facility], [address] [place], [place] [address]         Failed - Last BP in normal range    BP Readings from Last 1 Encounters:  10/15/23 (!) 181/60         Passed - K in normal range and within 180 days    Potassium  Date Value Ref Range Status  10/15/2023 4.2 3.5 - 5.1 mmol/L Final  08/25/2014 4.1 3.5 - 5.1 mmol/L Final         Passed - Ca in normal range and within 180 days    Calcium  Date Value Ref Range Status  10/15/2023 9.7 8.9 - 10.3 mg/dL Final   Calcium, Total  Date Value Ref Range Status  08/25/2014 9.1 8.5 - 10.1 mg/dL Final         Passed - Na in normal range and within 180 days    Sodium  Date Value Ref Range Status  10/15/2023 137 135 - 145 mmol/L Final  10/19/2015 135 134 - 144 mmol/L Final  08/25/2014 133 (L) 136 - 145 mmol/L Final         Passed - Cl in normal range and within 180 days    Chloride  Date Value Ref Range Status  10/15/2023 103 98 - 111 mmol/L Final  08/25/2014 100 98 - 107 mmol/L Final         Passed - Valid encounter within last 6 months    Recent Outpatient Visits           5 months ago Chest pain, unspecified type   [facility] Berniece Salines, FNP   6 months ago Lower extremity edema   [facility] Berniece Salines, FNP   6 months ago Lower extremity edema   [facility] Margarita Mail, DO   8 months ago Primary hypertension   [facility] Berniece Salines, FNP   9 months ago Periumbilical abdominal pain   [facility] Mecum, Oswaldo Conroy, PA-C       Future Appointments             In 2 weeks Sondra Come, MD [facility] [place]   In 5 months Zane Herald, Rudolpho Sevin, FNP [facility]             cloNIDine (CATAPRES) 0.2 MG tablet [Pharmacy Med Name: CLONIDINE 0.2MG  TABLETS] 180 tablet  0    Sig: TAKE 1 TABLET(0.2 MG) BY MOUTH TWICE DAILY     Cardiovascular:  Alpha-2 Agonists Failed - 10/16/2023  9:48 AM      Failed - Last BP in normal range    BP Readings from Last 1 Encounters:  10/15/23 (!) 181/60         Passed - Last Heart Rate in normal range    Pulse Readings from Last 1 Encounters:  10/15/23 72         Passed - Valid encounter within last 6 months    Recent Outpatient Visits           5 months ago Chest pain, unspecified type   [facility] Berniece Salines, FNP   6 months ago Lower extremity edema   [facility] Berniece Salines, FNP   6 months ago Lower extremity edema   [facility] Margarita Mail, DO   8 months ago Primary hypertension   [facility] Berniece Salines, FNP   9 months ago Periumbilical abdominal pain   [facility] Mecum, Oswaldo Conroy, PA-C       Future Appointments             In 2 weeks Richardo Hanks, Laurette Schimke, MD [facility]rology [place]   In 5 months Zane Herald, Rudolpho Sevin, FNP [facility]

## 2023-10-20 DIAGNOSIS — M170 Bilateral primary osteoarthritis of knee: Secondary | ICD-10-CM | POA: Diagnosis not present

## 2023-10-20 DIAGNOSIS — G4733 Obstructive sleep apnea (adult) (pediatric): Secondary | ICD-10-CM | POA: Diagnosis not present

## 2023-10-21 NOTE — Transitions of Care (Post Inpatient/ED Visit) (Signed)
   10/21/2023  Name: Jared Cline MRN: [id number] DOB: 15-Aug-1946  Today's TOC FU Call Status: Today's TOC FU Call Status:: Unsuccessful Call (2nd Attempt) Unsuccessful Call (1st Attempt) Date: 10/16/23 Unsuccessful Call (2nd Attempt) Date: 10/21/23  Attempted to reach the patient regarding the most recent Inpatient/ED visit.  Follow Up Plan: Additional outreach attempts will be made to reach the patient to complete the Transitions of Care (Post Inpatient/ED visit) call.   Signature Karena Addison, LPN [facility] Nurse Health Advisor Direct Dial [phone]

## 2023-10-21 NOTE — Transitions of Care (Post Inpatient/ED Visit) (Signed)
   10/21/2023  Name: Jared Cline MRN: [id number] DOB: [birth year]  Today's TOC FU Call Status: Today's TOC FU Call Status:: Unsuccessful Call (3rd Attempt) Unsuccessful Call (1st Attempt) Date: 10/16/23 Unsuccessful Call (2nd Attempt) Date: 10/21/23 Unsuccessful Call (3rd Attempt) Date: 10/21/23  Attempted to reach the patient regarding the most recent Inpatient/ED visit.  Follow Up Plan: No further outreach attempts will be made at this time. We have been unable to contact the patient.  Signature Karena Addison, LPN [facility] Nurse Health Advisor Direct Dial [phone]

## 2023-10-30 ENCOUNTER — Ambulatory Visit: Payer: Self-pay | Admitting: Urology

## 2023-10-30 VITALS — BP 175/67 | HR 76 | Ht 72.0 in | Wt 227.3 lb

## 2023-10-30 DIAGNOSIS — C61 Malignant neoplasm of prostate: Secondary | ICD-10-CM

## 2023-10-30 DIAGNOSIS — N2889 Other specified disorders of kidney and ureter: Secondary | ICD-10-CM

## 2023-10-30 DIAGNOSIS — R3911 Hesitancy of micturition: Secondary | ICD-10-CM | POA: Diagnosis not present

## 2023-10-30 LAB — BLADDER SCAN AMB NON-IMAGING

## 2023-10-30 NOTE — Progress Notes (Signed)
   10/30/2023 10:43 AM   Brooks Sailors [birth year] [id number]  Reason for visit: Follow up history of prostate cancer, urinary symptoms, small right renal mass  HPI: 78 year old male with distant history of open prostatectomy for prostate cancer over 30 years ago, and PSA has remained undetectable.  He is further than 10 years out from treatment and no further PSA monitoring needed.  He also has a known 1.5 cm right lower pole enhancing renal mass that has been stable since at least 2017, and has been on surveillance.  He also has history of urinary symptoms with urgency and frequency with high intake of tea and soda.  Overall, he is doing well and denies any major changes since his last visit.  Not having any incontinence.  Urinary frequency and urgency worsens when he consumes soda and tea.  We discussed behavioral strategies, avoiding bladder irritants, and minimizing fluids prior to bedtime.  PVR today normal at 59ml.  In terms of his small right renal mass, was recently seen in the ER for abdominal pain on 10/15/2023, and I personally viewed and interpreted those images that showed no change in the small right exophytic renal mass.  This has been stable for at least 10 years, and with his age and stability no further routine imaging is needed.  No further PSA screening needed with his history of definitive treatment with prostatectomy more than 30 years ago, and PSA has remained undetectable.  RTC 1 year symptom check, likely can follow-up at that time if doing well   Sondra Come, MD  [facility] Urology [address], [address] [place], [place] [address] [phone]

## 2023-10-30 NOTE — Patient Instructions (Signed)

## 2023-11-06 ENCOUNTER — Other Ambulatory Visit: Payer: Self-pay

## 2023-11-06 ENCOUNTER — Other Ambulatory Visit (INDEPENDENT_AMBULATORY_CARE_PROVIDER_SITE_OTHER): Payer: Self-pay | Admitting: Vascular Surgery

## 2023-11-06 DIAGNOSIS — D649 Anemia, unspecified: Secondary | ICD-10-CM

## 2023-11-07 ENCOUNTER — Inpatient Hospital Stay: Payer: Medicare Other | Admitting: Internal Medicine

## 2023-11-07 ENCOUNTER — Encounter: Payer: Self-pay | Admitting: Internal Medicine

## 2023-11-07 ENCOUNTER — Inpatient Hospital Stay: Payer: Medicare Other | Attending: Internal Medicine

## 2023-11-07 ENCOUNTER — Inpatient Hospital Stay: Payer: Medicare Other

## 2023-11-07 VITALS — BP 147/72 | HR 65 | Temp 97.5°F | Resp 16 | Ht 72.0 in | Wt 222.4 lb

## 2023-11-07 DIAGNOSIS — D509 Iron deficiency anemia, unspecified: Secondary | ICD-10-CM | POA: Diagnosis not present

## 2023-11-07 DIAGNOSIS — N1832 Chronic kidney disease, stage 3b: Secondary | ICD-10-CM | POA: Diagnosis not present

## 2023-11-07 DIAGNOSIS — D649 Anemia, unspecified: Secondary | ICD-10-CM

## 2023-11-07 DIAGNOSIS — I2699 Other pulmonary embolism without acute cor pulmonale: Secondary | ICD-10-CM | POA: Diagnosis not present

## 2023-11-07 DIAGNOSIS — Z86711 Personal history of pulmonary embolism: Secondary | ICD-10-CM | POA: Diagnosis not present

## 2023-11-07 DIAGNOSIS — N183 Chronic kidney disease, stage 3 unspecified: Secondary | ICD-10-CM | POA: Diagnosis not present

## 2023-11-07 DIAGNOSIS — N281 Cyst of kidney, acquired: Secondary | ICD-10-CM | POA: Diagnosis not present

## 2023-11-07 DIAGNOSIS — D631 Anemia in chronic kidney disease: Secondary | ICD-10-CM

## 2023-11-07 LAB — CBC WITH DIFFERENTIAL (CANCER CENTER ONLY)
Abs Immature Granulocytes: 0.02 10*3/uL (ref 0.00–0.07)
Basophils Absolute: 0 10*3/uL (ref 0.0–0.1)
Basophils Relative: 1 %
Eosinophils Absolute: 0.1 10*3/uL (ref 0.0–0.5)
Eosinophils Relative: 1 %
HCT: 37.6 % — ABNORMAL LOW (ref 39.0–52.0)
Hemoglobin: 12.3 g/dL — ABNORMAL LOW (ref 13.0–17.0)
Immature Granulocytes: 0 %
Lymphocytes Relative: 29 %
Lymphs Abs: 1.9 10*3/uL (ref 0.7–4.0)
MCH: 28.9 pg (ref 26.0–34.0)
MCHC: 32.7 g/dL (ref 30.0–36.0)
MCV: 88.3 fL (ref 80.0–100.0)
Monocytes Absolute: 0.6 10*3/uL (ref 0.1–1.0)
Monocytes Relative: 9 %
Neutro Abs: 4 10*3/uL (ref 1.7–7.7)
Neutrophils Relative %: 60 %
Platelet Count: 332 10*3/uL (ref 150–400)
RBC: 4.26 MIL/uL (ref 4.22–5.81)
RDW: 12.4 % (ref 11.5–15.5)
WBC Count: 6.7 10*3/uL (ref 4.0–10.5)
nRBC: 0 % (ref 0.0–0.2)

## 2023-11-07 LAB — IRON AND TIBC
Iron: 61 ug/dL (ref 45–182)
Saturation Ratios: 17 % — ABNORMAL LOW (ref 17.9–39.5)
TIBC: 358 ug/dL (ref 250–450)
UIBC: 297 ug/dL

## 2023-11-07 LAB — BASIC METABOLIC PANEL
Anion gap: 9 (ref 5–15)
BUN: 31 mg/dL — ABNORMAL HIGH (ref 8–23)
CO2: 27 mmol/L (ref 22–32)
Calcium: 9.7 mg/dL (ref 8.9–10.3)
Chloride: 99 mmol/L (ref 98–111)
Creatinine, Ser: 1.73 mg/dL — ABNORMAL HIGH (ref 0.61–1.24)
GFR, Estimated: 40 mL/min — ABNORMAL LOW (ref >60)
Glucose, Bld: 88 mg/dL (ref 70–99)
Potassium: 4.2 mmol/L (ref 3.5–5.1)
Sodium: 135 mmol/L (ref 135–145)

## 2023-11-07 LAB — FERRITIN: Ferritin: 107 ng/mL (ref 24–336)

## 2023-11-07 MED ORDER — IRON SUCROSE 20 MG/ML IV SOLN
200.0000 mg | Freq: Once | INTRAVENOUS | Status: AC
  Administered 2023-11-07: 200 mg via INTRAVENOUS

## 2023-11-07 NOTE — Progress Notes (Signed)
 Jared Cline CONSULT NOTE  Patient Care Team: Berniece Salines, FNP as PCP - General (Nurse Practitioner) Lamar Blinks, MD as Consulting Physician (Cardiology) Lorain Childes, MD as Consulting Physician (Nephrology) Sondra Come, MD as Consulting Physician (Urology) Earna Coder, MD as Consulting Physician (Oncology)  CHIEF COMPLAINTS/PURPOSE OF CONSULTATION: DVT/PE  # IMPRESSION: 1. Exam is positive for acute pulmonary embolus within segmental branch of the right upper lobe pulmonary artery and segmental branch of the left lower lobe pulmonary artery. 2. Mild mosaic attenuation of the lungs which may reflect small airways disease. 3. Multiple complicated renal cysts, reference abdominal MRI with follow-up recommendations 07/09/2021. 4. Coronary artery calcifications noted. 5. 5 mm right upper lobe pulmonary nodule is stable from 2019 and is considered benign. 6.  Aortic Atherosclerosis (ICD10-I70.0).     Electronically Signed   By: Signa Kell M.D.   On: 08/25/2022 08:57  # JAN 7th, 2024-Unprovoked- Acute pulmonary embolus within segmental branch of the right upper lobe pulmonary artery and segmental branch of the left lower lobe pulmonary artery. On Eliquis; CTA SEP 2024- NO acute PE. Eliquis 5 mg BID x 8 m; STOPPED  eliquis- in DEC 2025.    Oncology History   No history exists.   HISTORY OF PRESENTING ILLNESS: Patient ambulating-with assistance/walker.  Accompanied by wife.   Siddh Vandeventer 78 y.o.  male history of poorly controlled blood pressure; history of stroke; status post carotid stenting [on asprin ONLY; dr.Schnier];  chronic kidney disease and Hx of PE [JAN 2024] - unprovoked on eliquis 2.5mg  BID [since SEP 2024]- stopped in DEC 2024  is here for a follow up.   Fingers cramping on left hand, he was given lite salt to eat, could this cause these symptoms. Denies any blood in stools or black-colored stools. Also shortness of breath on  exertion improved.   No falls.    Review of Systems  Constitutional:  Positive for malaise/fatigue. Negative for chills, diaphoresis, fever and weight loss.  HENT:  Negative for nosebleeds and sore throat.   Eyes:  Negative for double vision.  Respiratory:  Positive for shortness of breath. Negative for cough, hemoptysis, sputum production and wheezing.   Cardiovascular:  Negative for chest pain, palpitations, orthopnea and leg swelling.  Gastrointestinal:  Negative for abdominal pain, blood in stool, constipation, diarrhea, heartburn, melena, nausea and vomiting.  Genitourinary:  Negative for dysuria, frequency and urgency.  Musculoskeletal:  Positive for back pain and joint pain.  Skin: Negative.  Negative for itching and rash.  Neurological:  Positive for weakness. Negative for dizziness, tingling, focal weakness and headaches.  Endo/Heme/Allergies:  Does not bruise/bleed easily.  Psychiatric/Behavioral:  Negative for depression. The patient is not nervous/anxious and does not have insomnia.      MEDICAL HISTORY:  Past Medical History:  Diagnosis Date   BPH (benign prostatic hypertrophy)    Bradycardia    evaluated by Dr. Gwen Pounds   Cancer [facility])    prostate   Carpal tunnel syndrome, left    left arm   CKD (chronic kidney disease) stage 2, GFR 60-89 ml/min    Diverticulitis    Gastritis 2017   History of prostate cancer    HNP (herniated nucleus pulposus), lumbar    Hyperlipidemia    Hypertension    IBS (irritable bowel syndrome)    Impotence    Multiple lung nodules    nonspecific, largest 5mm in Jan 2016; scanned March 2017; rescan 3-6 months later   OA (osteoarthritis)  of knee    right   Renal mass    evaluated; followed by urologist   Sleep apnea    on CPAP   Stroke (HCC)    Torn meniscus    x 2; right knee    SURGICAL HISTORY: Past Surgical History:  Procedure Laterality Date   abdominal ultrasound  Oct 2015   Negative for eval for AAA   CAROTID PTA/STENT  INTERVENTION Right 11/26/2022   Procedure: CAROTID PTA/STENT INTERVENTION;  Surgeon: Renford Dills, MD;  Location: [facility] INVASIVE CV LAB;  Service: Cardiovascular;  Laterality: Right;   COLONOSCOPY     KNEE ARTHROSCOPY Right    x 2   LUMBAR LAMINECTOMY     PROSTATECTOMY     robotic    SOCIAL HISTORY: Social History   Socioeconomic History   Marital status: Married    Spouse name: Xion Debruyne   Number of children: 3   Years of education: Not on file   Highest education level: Not on file  Occupational History   Occupation: retired [employer]  Tobacco Use   Smoking status: Former    Current packs/day: 0.00    Average packs/day: 0.5 packs/day for 9.0 years (4.5 ttl pk-yrs)    Types: Cigarettes    Start date: 08/19/1962    Quit date: 08/20/1971    Years since quitting: 52.2    Passive exposure: Past   Smokeless tobacco: Never  Vaping Use   Vaping status: Never Used  Substance and Sexual Activity   Alcohol use: No    Alcohol/week: 0.0 standard drinks of alcohol   Drug use: No   Sexual activity: Not Currently  Other Topics Concern   Not on file  Social History Narrative   Retired from city [employer] [place] 2012.   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    Social Drivers of Corporate investment banker Strain: Low Risk  (07/03/2023)   Overall Financial Resource Strain (CARDIA)    Difficulty of Paying Living Expenses: Not very hard  Food Insecurity: No Food Insecurity (07/03/2023)   Hunger Vital Sign    Worried About Running Out of Food in the Last Year: Never true    Ran Out of Food in the Last Year: Never true  Transportation Needs: No Transportation Needs (07/03/2023)   PRAPARE - Administrator, Civil Service (Medical): No    Lack of Transportation (Non-Medical): No  Physical Activity: Insufficiently Active (07/03/2023)   Exercise Vital Sign    Days of Exercise per Week: 3 days    Minutes of Exercise per Session: 30 min  Stress: No  Stress Concern Present (07/03/2023)   [employer] of Occupational Health - Occupational Stress Questionnaire    Feeling of Stress : Only a little  Social Connections: Moderately Integrated (07/03/2023)   Social Connection and Isolation Panel [NHANES]    Frequency of Communication with Friends and Family: Three times a week    Frequency of Social Gatherings with Friends and Family: Twice a week    Attends Religious Services: More than 4 times per year    Active Member of [employer] or Organizations: No    Attends Banker Meetings: Never    Marital Status: Married  Catering manager Violence: Not At Risk (07/03/2023)   Humiliation, Afraid, Rape, and Kick questionnaire    Fear of Current or Ex-Partner: No    Emotionally Abused: No    Physically Abused: No    Sexually Abused:  No    FAMILY HISTORY: Family History  Problem Relation Age of Onset   Stroke Mother    Hypertension Mother    Aneurysm Father        cerebral   Hypertension Father    Heart disease Paternal Uncle    Alcohol abuse Brother    Cancer Neg Hx    COPD Neg Hx    Diabetes Neg Hx     ALLERGIES:  is allergic to flomax [tamsulosin hcl], lisinopril, ace inhibitors, lipitor [atorvastatin], amlodipine, and doxazosin.  MEDICATIONS:  Current Outpatient Medications  Medication Sig Dispense Refill   acetaminophen (TYLENOL) 650 MG CR tablet Take 2 tablets (1,300 mg total) by mouth every 12 (twelve) hours as needed (Mild pain).     aspirin EC 81 MG tablet Take 1 tablet (81 mg total) by mouth daily at 6 (six) AM. Swallow whole. 30 tablet 11   cloNIDine (CATAPRES) 0.2 MG tablet TAKE 1 TABLET(0.2 MG) BY MOUTH TWICE DAILY 180 tablet 0   clopidogrel (PLAVIX) 75 MG tablet TAKE 1 TABLET(75 MG) BY MOUTH DAILY 30 tablet 11   cyanocobalamin (VITAMIN B12) 1000 MCG tablet Take 1,000 mcg by mouth daily.     diclofenac Sodium (VOLTAREN) 1 % GEL Apply 2 g topically 4 (four) times daily. 400 g 5   hydrALAZINE (APRESOLINE) 10  MG tablet TAKE 1 TABLET BY MOUTH TWICE DAILY AS NEEDED, TAKE IF BLOOD PRESSURE GREATER THEN 140/90 180 tablet 0   labetalol (NORMODYNE) 100 MG tablet 100 mg 2 (two) times daily.     magnesium oxide (MAG-OX) 400 MG tablet 1 tablet 500mg  Orally Once a day     pravastatin (PRAVACHOL) 80 MG tablet TAKE 1 TABLET(80 MG) BY MOUTH AT BEDTIME 90 tablet 2   torsemide (DEMADEX) 20 MG tablet TAKE 1 TABLET(20 MG) BY MOUTH TWICE DAILY 180 tablet 0   valsartan (DIOVAN) 160 MG tablet Take 1 tablet (160 mg total) by mouth daily.     No current facility-administered medications for this visit.       PHYSICAL EXAMINATION:  Vitals:   11/07/23 1317 11/07/23 1329  BP: (!) 180/64 (!) 147/72  Pulse: 65   Resp: 16   Temp: (!) 97.5 F (36.4 C)   SpO2: 100%    Filed Weights   11/07/23 1317  Weight: 222 lb 6.4 oz (100.9 kg)    Physical Exam Vitals and nursing note reviewed.  HENT:     Head: Normocephalic and atraumatic.     Mouth/Throat:     Pharynx: Oropharynx is clear.  Eyes:     Extraocular Movements: Extraocular movements intact.     Pupils: Pupils are equal, round, and reactive to light.  Cardiovascular:     Rate and Rhythm: Normal rate and regular rhythm.  Pulmonary:     Comments: Decreased breath sounds bilaterally.  Abdominal:     Palpations: Abdomen is soft.  Musculoskeletal:        General: Normal range of motion.     Cervical back: Normal range of motion.  Skin:    General: Skin is warm.  Neurological:     General: No focal deficit present.     Mental Status: He is alert and oriented to person, place, and time.  Psychiatric:        Behavior: Behavior normal.        Judgment: Judgment normal.      LABORATORY DATA:  I have reviewed the data as listed Lab Results  Component Value Date  WBC 6.7 11/07/2023   HGB 12.3 (L) 11/07/2023   HCT 37.6 (L) 11/07/2023   MCV 88.3 11/07/2023   PLT 332 11/07/2023   Recent Labs    01/29/23 1016 03/31/23 1055 05/02/23 0943  05/12/23 1449 08/07/23 1313 09/23/23 1033 10/15/23 1115 11/07/23 1306  NA 137   < > 132*  --  138 138 137 135  K 3.9   < > 3.7  --  4.0 4.4 4.2 4.2  CL 103   < > 102  --  102 106 103 99  CO2 24   < > 21*  --  23 24 26 27   GLUCOSE 131*   < > 146*  --  135* 94 110* 88  BUN 22   < > 26*  --  23 16 18  31*  CREATININE 1.40*   < > 1.83*   < > 1.78* 1.51* 1.48* 1.73*  CALCIUM 8.1*   < > 9.3  --  9.6 9.8 9.7 9.7  GFRNONAA 51*   < > 37*  --  39*  --  48* 40*  PROT 6.9   < > 7.5  --   --  6.8 7.4  --   ALBUMIN 4.1  --  4.4  --   --   --  4.2  --   AST 24   < > 24  --   --  16 22  --   ALT 15   < > 15  --   --  11 16  --   ALKPHOS 58  --  50  --   --   --  54  --   BILITOT 0.2*   < > 0.3  --   --  0.4 0.5  --    < > = values in this interval not displayed.    RADIOGRAPHIC STUDIES: I have personally reviewed the radiological images as listed and agreed with the findings in the report. CT ABDOMEN PELVIS W CONTRAST Result Date: 10/15/2023 CLINICAL DATA:  Lambert Mody lower abdominal pain since this morning, no bowel movement for 3 days EXAM: CT ABDOMEN AND PELVIS WITH CONTRAST TECHNIQUE: Multidetector CT imaging of the abdomen and pelvis was performed using the standard protocol following bolus administration of intravenous contrast. RADIATION DOSE REDUCTION: This exam was performed according to the departmental dose-optimization program which includes automated exposure control, adjustment of the mA and/or kV according to patient size and/or use of iterative reconstruction technique. CONTRAST:  OMNIPAQUE IOHEXOL 300 MG/ML  SOLN COMPARISON:  08/24/2022, 07/09/2021 FINDINGS: Lower chest: No acute pleural or parenchymal lung disease. Hepatobiliary: Significant atrophy of the left lobe liver is again noted. No focal parenchymal liver abnormalities. Gallbladder is unremarkable. No biliary duct dilation. Pancreas: Unremarkable. No pancreatic ductal dilatation or surrounding inflammatory changes. Spleen:  Normal in size without focal abnormality. Adrenals/Urinary Tract: Numerous hypodensities are seen throughout the kidneys bilaterally, without appreciable change since prior study, consistent with simple and complex cysts identified on prior MRI. There is a stable enhancing lesion within the lower pole right kidney, measuring 1.5 cm reference image 37/2, consistent with renal cell carcinoma as previously described on MRI. 3 mm nonobstructing calculus lower pole right kidney. Punctate calcification at the left renal hilum is likely vascular. No obstructive uropathy. The adrenals and bladder are unremarkable. Stomach/Bowel: No bowel obstruction or ileus. There is moderate retained stool within the colon consistent with constipation. Normal appendix right lower quadrant. No bowel wall thickening or inflammatory change. Small hiatal hernia. Vascular/Lymphatic:  Aortic atherosclerosis. No enlarged abdominal or pelvic lymph nodes. Reproductive: Prior prostatectomy. No abnormality within the prostate bed. Other: No free fluid or free intraperitoneal gas. No abdominal wall hernia. Musculoskeletal: No acute or destructive bony abnormalities. Reconstructed images demonstrate no additional findings. IMPRESSION: 1. No acute intra-abdominal or intrapelvic process. 2. Moderate retained stool throughout the colon consistent with constipation. No bowel obstruction or ileus. 3. Bilateral simple and complex renal cysts unchanged since prior study. Stable enhancing lesion lower pole right kidney compatible with renal cell carcinoma as characterized on prior MRI. Please refer to MRI report 07/09/2021 for follow-up recommendations. 4. Small hiatal hernia. 5. Nonobstructing 3 mm right renal calculus. 6.  Aortic Atherosclerosis (ICD10-I70.0). Electronically Signed   By: Sharlet Salina M.D.   On: 10/15/2023 15:46    ASSESSMENT & PLAN:   Acute pulmonary embolism without acute cor pulmonale (HCC) # JAN 7th, 2024-Unprovoked- Acute  pulmonary embolus within segmental branch of the right upper lobe pulmonary artery and segmental branch of the left lower lobe pulmonary artery. On Eliquis; CTA SEP 2024- NO acute PE. Eliquis 5 mg BID x 8 m; STOPPED  eliquis- in DEC 2025.   # Mild anemia Hb 9-10- ? Sec to CKD-/IDA- OFF  gentle iron [iron biglycinate; 28 mg- constipation]. Continue IV venofer-  awaiting [facility] colonoscopy in April 2025.   # Cerebellar stroke- JAN 2024 [facility]; Dr.Shah- AUG 2023]- S/p right carotid stenting- [april 2024; Dr.Schneir]; Also- noted to have Severe, bilateral basilar artery occlusion, occluded left vertebral artery, moderate right vertebral artery stenosis-[ Dr Arlyss Queen at [facility]surgery [oct 2024]- conservative management. On Asprin & plavix-  stable.   #  CT sca AP- FEB 2025- Bilateral simple and complex renal cysts unchanged since prior study. Stable enhancing lesion lower pole right kidney compatible with renal cell carcinoma as characterized on prior MRI [Dr.Korrapati]; MRI- 2022- Brightly enhancing mass arising from the inferior pole of the right kidney measuring 1.4 x 1.4 cm- follow up Nephrology.   # Bil LE swelling/edema- recommend continue lasix/ and bil LE stockings- stable.   # Poorly controlled BP-better today. Stable.   # CKD [Dr.Kolluru]- III - recommend avoiding nephrotoxic NSAIDs-Advil/Motrin- Stable.   # DISPOSITION: #  venofer today. # follow up in 6  month- MD; labs- cbc/bmp; iron studies, ferritin- possible venofer- Dr.B   All questions were answered. The patient knows to call the clinic with any problems, questions or concerns.    Earna Coder, MD 11/07/2023 1:48 PM

## 2023-11-07 NOTE — Patient Instructions (Signed)

## 2023-11-07 NOTE — Progress Notes (Signed)
 CT abd/pelvis 10/15/23.  Fingers cramping on left hand, he was given lite salt to eat, could this cause these symptoms?

## 2023-11-07 NOTE — Assessment & Plan Note (Addendum)
#   JAN 7th, 2024-Unprovoked- Acute pulmonary embolus within segmental branch of the right upper lobe pulmonary artery and segmental branch of the left lower lobe pulmonary artery. On Eliquis; CTA SEP 2024- NO acute PE. Eliquis 5 mg BID x 8 m; STOPPED  eliquis- in DEC 2025.   # Mild anemia Hb 9-10- ? Sec to CKD-/IDA- OFF  gentle iron [iron biglycinate; 28 mg- constipation]. Continue IV venofer-  awaiting [facility] colonoscopy in April 2025.   # Cerebellar stroke- JAN 2024 [facility]; Dr.Shah- AUG 2023]- S/p right carotid stenting- [april 2024; Dr.Schneir]; Also- noted to have Severe, bilateral basilar artery occlusion, occluded left vertebral artery, moderate right vertebral artery stenosis-[ Dr Arlyss Queen at [facility] neurosurgery [oct 2024]- conservative management. On Asprin & plavix-  stable.   #  CT sca AP- FEB 2025- Bilateral simple and complex renal cysts unchanged since prior study. Stable enhancing lesion lower pole right kidney compatible with renal cell carcinoma as characterized on prior MRI [Dr.Korrapati]; MRI- 2022- Brightly enhancing mass arising from the inferior pole of the right kidney measuring 1.4 x 1.4 cm- follow up Nephrology.   # Bil LE swelling/edema- recommend continue lasix/ and bil LE stockings- stable.   # Poorly controlled BP-better today. Stable.   # CKD [Dr.Kolluru]- III - recommend avoiding nephrotoxic NSAIDs-Advil/Motrin- Stable.   # DISPOSITION: #  venofer today. # follow up in 6  month- MD; labs- cbc/bmp; iron studies, ferritin- possible venofer- Dr.B

## 2023-11-13 ENCOUNTER — Ambulatory Visit
Admission: RE | Admit: 2023-11-13 | Discharge: 2023-11-13 | Disposition: A | Discharge status: Home / Self Care | Attending: Nurse Practitioner | Admitting: Nurse Practitioner

## 2023-11-13 ENCOUNTER — Ambulatory Visit: Payer: Self-pay | Admitting: *Deleted

## 2023-11-13 ENCOUNTER — Ambulatory Visit
Admission: RE | Admit: 2023-11-13 | Discharge: 2023-11-13 | Disposition: A | Discharge status: Home / Self Care | Source: Ambulatory Visit | Attending: Nurse Practitioner | Admitting: Nurse Practitioner

## 2023-11-13 ENCOUNTER — Encounter: Payer: Self-pay | Admitting: Nurse Practitioner

## 2023-11-13 ENCOUNTER — Other Ambulatory Visit: Payer: Self-pay | Admitting: Nurse Practitioner

## 2023-11-13 ENCOUNTER — Ambulatory Visit (INDEPENDENT_AMBULATORY_CARE_PROVIDER_SITE_OTHER): Admitting: Nurse Practitioner

## 2023-11-13 VITALS — BP 132/60 | HR 77 | Temp 97.9°F | Resp 18 | Ht 72.0 in | Wt 223.8 lb

## 2023-11-13 DIAGNOSIS — K5900 Constipation, unspecified: Secondary | ICD-10-CM

## 2023-11-13 DIAGNOSIS — I10 Essential (primary) hypertension: Secondary | ICD-10-CM | POA: Diagnosis not present

## 2023-11-13 DIAGNOSIS — R1084 Generalized abdominal pain: Secondary | ICD-10-CM

## 2023-11-13 MED ORDER — LINACLOTIDE 145 MCG PO CAPS: 145.0000 ug | ORAL_CAPSULE | Freq: Every day | ORAL | 0 refills | Status: DC

## 2023-11-13 NOTE — Telephone Encounter (Signed)
 Copied from CRM [id number]. Topic: Clinical - Red Word Triage >> Nov 13, 2023  9:29 AM Higinio Roger wrote: Reason for CRM: High Blood pressure. No symptoms.  Reading: 171/63  Patient concerned about bottom number. Reason for Disposition  Systolic BP  >= 160 OR Diastolic >= 100  Answer Assessment - Initial Assessment Questions 1. BLOOD PRESSURE: "What is the blood pressure?" "Did you take at least two measurements 5 minutes apart?"     Glenda calling in.  181/81 last night    171/63 this morning.    2. ONSET: "When did you take your blood pressure?"      3. HOW: "How did you take your blood pressure?" (e.g., automatic home BP monitor, visiting nurse)     This morning I'm having some headache, husband got on line.   He has had 2 strokes. 4. HISTORY: "Do you have a history of high blood pressure?"     Yes   5. MEDICINES: "Are you taking any medicines for blood pressure?" "Have you missed any doses recently?"     Yes  Taking it daily as prescribed He got on the line.    I have Clonodine and labetalol.   I take them at night.  6. OTHER SYMPTOMS: "Do you have any symptoms?" (e.g., blurred vision, chest pain, difficulty breathing, headache, weakness)     No shortness of breath or chest pain.   I feel like my medicine needs changing.  Headaches that varies.    7. PREGNANCY: "Is there any chance you are pregnant?" "When was your last menstrual period?"     N/A  Protocols used: Blood Pressure - High-A-AH  Chief Complaint: elevated BP and occasional headaches.   Also mentioned having stomach pain in the mornings.  History of 2 strokes. Symptoms: above Frequency: Daily BP been elevated.   Wife concerned about lower number however it's the systolic number that is high.   Pertinent Negatives: Patient denies missing any doses of his medications. Disposition: [] ED /[] Urgent Care (no appt availability in office) / [x] Appointment(In office/virtual)/ []  [facility]/ [] Home Care/ [] Refused  Recommended Disposition /[] [facility]/ []  Follow-up with PCP Additional Notes: Appt made for today with Della Goo, FNP at 11:20

## 2023-11-13 NOTE — Progress Notes (Signed)
 BP 132/60   Pulse 77   Temp 97.9 F (36.6 C)   Resp 18   Ht 6' (1.829 m)   Wt 223 lb 12.8 oz (101.5 kg)   SpO2 99%   BMI 30.35 kg/m    Subjective:    Patient ID: Jared Cline, male    DOB: [birth year], 78 y.o.   MRN: [id number]  HPI: Jared Cline is a 78 y.o. male  Chief Complaint  Patient presents with   Hypertension    W/ headaches   Abdominal Pain    Every am    Discussed the use of AI scribe software for clinical note transcription with the patient, who gave verbal consent to proceed.  History of Present Illness Jared Cline is a 79 year old male with hypertension who presents with abdominal pain and constipation.  He has been experiencing intermittent abdominal pain for over two months, located in the general stomach area. The pain is associated with constipation and tends to improve after taking laxatives, such as two Dulcolax tablets, which he uses two to three times a week. The pain often returns as he 'fills up again.' No changes in bowel habits aside from constipation. No nausea, fever, or acid reflux. A CT scan in the emergency room showed constipation but no other concerning findings. His last bowel movement was on Thursday after taking Dulcolax on Tuesday.  He has a history of hypertension and monitors his blood pressure at home. He takes clonidine 0.2 mg two times a day, labetalol 100 mg two times a day, valsartan 160 mg daily and torsemide 20 mg two times a day, hydralazine up to twice a day when needed, particularly when he experiences a headache or stomach pain, which he associates with elevated blood pressure. He notes that his blood pressure tends to rise when he is in pain, which he manages by taking hydralazine.    CT ABDOMEN PELVIS W CONTRAST CLINICAL DATA:  Sharp lower abdominal pain since this morning, no bowel movement for 3 days  EXAM: CT ABDOMEN AND PELVIS WITH CONTRAST  TECHNIQUE: Multidetector CT imaging of the abdomen and pelvis was  performed using the standard protocol following bolus administration of intravenous contrast.  RADIATION DOSE REDUCTION: This exam was performed according to the departmental dose-optimization program which includes automated exposure control, adjustment of the mA and/or kV according to patient size and/or use of iterative reconstruction technique.  CONTRAST:  OMNIPAQUE IOHEXOL 300 MG/ML  SOLN  COMPARISON:  08/24/2022, 07/09/2021  FINDINGS: Lower chest: No acute pleural or parenchymal lung disease.  Hepatobiliary: Significant atrophy of the left lobe liver is again noted. No focal parenchymal liver abnormalities. Gallbladder is unremarkable. No biliary duct dilation.  Pancreas: Unremarkable. No pancreatic ductal dilatation or surrounding inflammatory changes.  Spleen: Normal in size without focal abnormality.  Adrenals/Urinary Tract: Numerous hypodensities are seen throughout the kidneys bilaterally, without appreciable change since prior study, consistent with simple and complex cysts identified on prior MRI. There is a stable enhancing lesion within the lower pole right kidney, measuring 1.5 cm reference image 37/2, consistent with renal cell carcinoma as previously described on MRI.  3 mm nonobstructing calculus lower pole right kidney. Punctate calcification at the left renal hilum is likely vascular. No obstructive uropathy. The adrenals and bladder are unremarkable.  Stomach/Bowel: No bowel obstruction or ileus. There is moderate retained stool within the colon consistent with constipation. Normal appendix right lower quadrant. No bowel wall thickening or inflammatory change. Small hiatal hernia.  Vascular/Lymphatic:  Aortic atherosclerosis. No enlarged abdominal or pelvic lymph nodes.  Reproductive: Prior prostatectomy. No abnormality within the prostate bed.  Other: No free fluid or free intraperitoneal gas. No abdominal wall hernia.  Musculoskeletal: No  acute or destructive bony abnormalities. Reconstructed images demonstrate no additional findings.  IMPRESSION: 1. No acute intra-abdominal or intrapelvic process. 2. Moderate retained stool throughout the colon consistent with constipation. No bowel obstruction or ileus. 3. Bilateral simple and complex renal cysts unchanged since prior study. Stable enhancing lesion lower pole right kidney compatible with renal cell carcinoma as characterized on prior MRI. Please refer to MRI report 07/09/2021 for follow-up recommendations. 4. Small hiatal hernia. 5. Nonobstructing 3 mm right renal calculus. 6.  Aortic Atherosclerosis (ICD10-I70.0).  Electronically Signed   By: Sharlet Salina M.D.   On: 10/15/2023 15:46       07/03/2023    9:04 AM 04/30/2023    2:16 PM 03/31/2023   10:21 AM  Depression screen PHQ 2/9  Decreased Interest 0 0 0  Down, Depressed, Hopeless 0 0 0  PHQ - 2 Score 0 0 0  Altered sleeping 0 0   Tired, decreased energy 0 0   Change in appetite 0 0   Feeling bad or failure about yourself  0 0   Trouble concentrating 0 0   Moving slowly or fidgety/restless 0 0   Suicidal thoughts 0 0   PHQ-9 Score 0 0   Difficult doing work/chores Not difficult at all      Relevant past medical, surgical, family and social history reviewed and updated as indicated. Interim medical history since our last visit reviewed. Allergies and medications reviewed and updated.  Review of Systems  Ten systems reviewed and is negative except as mentioned in HPI      Objective:    BP 132/60   Pulse 77   Temp 97.9 F (36.6 C)   Resp 18   Ht 6' (1.829 m)   Wt 223 lb 12.8 oz (101.5 kg)   SpO2 99%   BMI 30.35 kg/m    Wt Readings from Last 3 Encounters:  11/13/23 223 lb 12.8 oz (101.5 kg)  11/07/23 222 lb 6.4 oz (100.9 kg)  10/30/23 227 lb 4.8 oz (103.1 kg)    Physical Exam Vitals reviewed.  Constitutional:      Appearance: Normal appearance.  HENT:     Head: Normocephalic.   Cardiovascular:     Rate and Rhythm: Normal rate.  Pulmonary:     Effort: Pulmonary effort is normal.  Abdominal:     General: Bowel sounds are normal.     Palpations: Abdomen is soft. There is no mass.     Tenderness: There is generalized abdominal tenderness.  Neurological:     General: No focal deficit present.     Mental Status: He is alert and oriented to person, place, and time. Mental status is at baseline.  Psychiatric:        Mood and Affect: Mood normal.        Behavior: Behavior normal.        Thought Content: Thought content normal.        Judgment: Judgment normal.     Results for orders placed or performed in visit on 11/07/23  Ferritin   Collection Time: 11/07/23  1:06 PM  Result Value Ref Range   Ferritin 107 24 - 336 ng/mL  Iron and TIBC   Collection Time: 11/07/23  1:06 PM  Result Value Ref Range   Iron  61 45 - 182 ug/dL   TIBC 161 096 - 045 ug/dL   Saturation Ratios 17 (L) 17.9 - 39.5 %   UIBC 297 ug/dL  Basic metabolic panel   Collection Time: 11/07/23  1:06 PM  Result Value Ref Range   Sodium 135 135 - 145 mmol/L   Potassium 4.2 3.5 - 5.1 mmol/L   Chloride 99 98 - 111 mmol/L   CO2 27 22 - 32 mmol/L   Glucose, Bld 88 70 - 99 mg/dL   BUN 31 (H) 8 - 23 mg/dL   Creatinine, Ser 4.09 (H) 0.61 - 1.24 mg/dL   Calcium 9.7 8.9 - 81.1 mg/dL   GFR, Estimated 40 (L) >60 mL/min   Anion gap 9 5 - 15  CBC with Differential (Cancer Center Only)   Collection Time: 11/07/23  1:06 PM  Result Value Ref Range   WBC Count 6.7 4.0 - 10.5 K/uL   RBC 4.26 4.22 - 5.81 MIL/uL   Hemoglobin 12.3 (L) 13.0 - 17.0 g/dL   HCT 91.4 (L) 78.2 - 95.6 %   MCV 88.3 80.0 - 100.0 fL   MCH 28.9 26.0 - 34.0 pg   MCHC 32.7 30.0 - 36.0 g/dL   RDW 21.3 08.6 - 57.8 %   Platelet Count 332 150 - 400 K/uL   nRBC 0.0 0.0 - 0.2 %   Neutrophils Relative % 60 %   Neutro Abs 4.0 1.7 - 7.7 K/uL   Lymphocytes Relative 29 %   Lymphs Abs 1.9 0.7 - 4.0 K/uL   Monocytes Relative 9 %    Monocytes Absolute 0.6 0.1 - 1.0 K/uL   Eosinophils Relative 1 %   Eosinophils Absolute 0.1 0.0 - 0.5 K/uL   Basophils Relative 1 %   Basophils Absolute 0.0 0.0 - 0.1 K/uL   Immature Granulocytes 0 %   Abs Immature Granulocytes 0.02 0.00 - 0.07 K/uL       Assessment & Plan:   Problem List Items Addressed This Visit       Cardiovascular and Mediastinum   Hypertension (Chronic)     Other   Abdominal pain - Primary   Relevant Orders   DG Abd 1 View   Constipation     Assessment and Plan Assessment & Plan Abdominal Pain Intermittent abdominal pain for over two months, associated with constipation. CT scan confirmed constipation. No nausea, fever, or acid reflux. Pain improves post-bowel movements. Recent CBC, metabolic panel, lipase, and liver function tests are normal. Differential diagnosis suggests constipation as the primary cause. - Order abdominal x-ray to confirm constipation and rule out other issues - Evaluate x-ray results for further management  Constipation Chronic constipation requiring laxatives two to three times weekly. CT scan confirmed constipation. Bowel movements alleviate abdominal pain, indicating constipation as a contributing factor. Alternative treatments will be considered if confirmed as the cause of pain. - Continue laxatives as needed - Evaluate x-ray results for further management  Hypertension Hypertension managed with  clonidine 0.2 mg two times a day, labetalol 100 mg two times a day, valsartan 160 mg daily and torsemide 20 mg two times a day, hydralazine. Blood pressure increases with abdominal pain, a normal physiological response. Headaches occur with elevated blood pressure. Home health nurse advised against hydralazine, but current management is effective. Reinforced adherence to medication regimen. - Continue hydralazine - Allow second dose of hydralazine if needed - Reinforce adherence to medication regimen  General Health  Maintenance Colonoscopy last performed in 2017. Scheduled for repeat in  April. - Proceed with scheduled colonoscopy in April        Follow up plan: Return if symptoms worsen or fail to improve.

## 2023-11-18 ENCOUNTER — Ambulatory Visit: Payer: Self-pay

## 2023-11-18 NOTE — Progress Notes (Signed)
 "  BP (!) 140/60   Pulse 69   Resp 18   Ht 6' (1.829 m)   Wt 220 lb 11.2 oz (100.1 kg)   SpO2 99%   BMI 29.93 kg/m    Subjective:    Patient ID: Jared Cline, male    DOB: 1946-06-09, 78 y.o.   MRN: [id number]  HPI: Jared Cline is a 78 y.o. male  Chief Complaint  Patient presents with   foot pain    W/ burning    Discussed the use of AI scribe software for clinical note transcription with the patient, who gave verbal consent to proceed.  History of Present Illness Jared Cline is a 78 year old male who presents with a burning sensation in both feet.  He has been experiencing a persistent burning sensation in both feet for the past four to five months, which occurs both day and night. The sensation worsens when sitting in a vehicle or upon waking in the morning. It is localized to his feet, with no similar sensations in his legs. The symptoms began after a stroke, although he was not present immediately upon returning home, and have slightly worsened over time. A Doppler study of his legs conducted in January 2024 was normal.          07/03/2023    9:04 AM 04/30/2023    2:16 PM 03/31/2023   10:21 AM  Depression screen PHQ 2/9  Decreased Interest 0 0 0  Down, Depressed, Hopeless 0 0 0  PHQ - 2 Score 0 0 0  Altered sleeping 0 0   Tired, decreased energy 0 0   Change in appetite 0 0   Feeling bad or failure about yourself  0 0   Trouble concentrating 0 0   Moving slowly or fidgety/restless 0 0   Suicidal thoughts 0 0   PHQ-9 Score 0 0   Difficult doing work/chores Not difficult at all      Relevant past medical, surgical, family and social history reviewed and updated as indicated. Interim medical history since our last visit reviewed. Allergies and medications reviewed and updated.  Review of Systems  Ten systems reviewed and is negative except as mentioned in HPI      Objective:    BP (!) 140/60   Pulse 69   Resp 18   Ht 6' (1.829 m)   Wt 220 lb 11.2 oz (100.1  kg)   SpO2 99%   BMI 29.93 kg/m    Wt Readings from Last 3 Encounters:  11/19/23 220 lb 11.2 oz (100.1 kg)  11/13/23 223 lb 12.8 oz (101.5 kg)  11/07/23 222 lb 6.4 oz (100.9 kg)    Physical Exam Physical Exam GENERAL: Alert, cooperative, well developed, no acute distress. HEENT: Normocephalic, normal oropharynx, moist mucous membranes. CHEST: Clear to auscultation bilaterally. No wheezes, rhonchi, or crackles. CARDIOVASCULAR: Normal heart rate and rhythm, S1 and S2 normal without murmurs. Pedal Pulses present bilaterally. ABDOMEN: Soft, non-tender, non-distended, without organomegaly. Normal bowel sounds. EXTREMITIES: No cyanosis or edema. NEUROLOGICAL: Cranial nerves grossly intact. Moves all extremities without gross motor or sensory deficit.   Results for orders placed or performed in visit on 11/07/23  Ferritin   Collection Time: 11/07/23  1:06 PM  Result Value Ref Range   Ferritin 107 24 - 336 ng/mL  Iron  and TIBC   Collection Time: 11/07/23  1:06 PM  Result Value Ref Range   Iron  61 45 - 182 ug/dL   TIBC 641 749 -  450 ug/dL   Saturation Ratios 17 (L) 17.9 - 39.5 %   UIBC 297 ug/dL  Basic metabolic panel   Collection Time: 11/07/23  1:06 PM  Result Value Ref Range   Sodium 135 135 - 145 mmol/L   Potassium 4.2 3.5 - 5.1 mmol/L   Chloride 99 98 - 111 mmol/L   CO2 27 22 - 32 mmol/L   Glucose, Bld 88 70 - 99 mg/dL   BUN 31 (H) 8 - 23 mg/dL   Creatinine, Ser 8.26 (H) 0.61 - 1.24 mg/dL   Calcium  9.7 8.9 - 10.3 mg/dL   GFR, Estimated 40 (L) >60 mL/min   Anion gap 9 5 - 15  CBC with Differential (Cancer Center Only)   Collection Time: 11/07/23  1:06 PM  Result Value Ref Range   WBC Count 6.7 4.0 - 10.5 K/uL   RBC 4.26 4.22 - 5.81 MIL/uL   Hemoglobin 12.3 (L) 13.0 - 17.0 g/dL   HCT 62.3 (L) 60.9 - 47.9 %   MCV 88.3 80.0 - 100.0 fL   MCH 28.9 26.0 - 34.0 pg   MCHC 32.7 30.0 - 36.0 g/dL   RDW 87.5 88.4 - 84.4 %   Platelet Count 332 150 - 400 K/uL   nRBC 0.0 0.0 -  0.2 %   Neutrophils Relative % 60 %   Neutro Abs 4.0 1.7 - 7.7 K/uL   Lymphocytes Relative 29 %   Lymphs Abs 1.9 0.7 - 4.0 K/uL   Monocytes Relative 9 %   Monocytes Absolute 0.6 0.1 - 1.0 K/uL   Eosinophils Relative 1 %   Eosinophils Absolute 0.1 0.0 - 0.5 K/uL   Basophils Relative 1 %   Basophils Absolute 0.0 0.0 - 0.1 K/uL   Immature Granulocytes 0 %   Abs Immature Granulocytes 0.02 0.00 - 0.07 K/uL       Assessment & Plan:   Problem List Items Addressed This Visit   None Visit Diagnoses       Neuropathy    -  Primary   Relevant Medications   gabapentin  (NEURONTIN ) 100 MG capsule   Other Relevant Orders   Vitamin B12   TSH     Burning sensation of feet       Relevant Orders   Vitamin B12   TSH        Assessment and Plan Assessment & Plan Peripheral Neuropathy Chronic burning and tingling in both feet for 4-5 months, suggestive of peripheral neuropathy. Symptoms are localized to the feet, with a normal Doppler study of the legs. Differential diagnosis includes neuropathy and claudication. Gabapentin  is considered for symptomatic relief, with potential side effect of drowsiness discussed. He prefers to try medication before further vascular evaluation. - Prescribe gabapentin  100 mg with titration: start with one dose at bedtime, then one dose in the morning and at bedtime on the second day, and three times daily from the third day. - Monitor response to gabapentin  and report if symptoms persist. - Consider referral to vascular specialist if symptoms do not improve with medication.  Vitamin B12 Deficiency Screening No recent B12 level available. Low B12 can contribute to neuropathy symptoms. Plan to check B12 levels to rule out deficiency as a contributing factor. - Order Vitamin B12 level.  Thyroid  Function Screening Thyroid  function can impact overall health and contribute to symptoms. Plan to check thyroid  function as part of the evaluation. - Order thyroid   function tests.  Driving Safety Desires to resume driving but reports occasional dizziness  when bending over and standing up quickly. Discussed safety concerns and suggested a trial drive in a parking lot to assess capability. His wife is concerned about driving safety. - Recommend trial driving in a parking lot to assess safety and comfort with driving.        Follow up plan: Return if symptoms worsen or fail to improve.      "

## 2023-11-18 NOTE — Telephone Encounter (Signed)
 Copied from CRM [id number]. Topic: Clinical - Red Word Triage >> Nov 18, 2023 10:57 AM Antwanette L wrote: Red Word that prompted transfer to Nurse Triage: Burning sensation in both feet. Patient is experiencing pain when he walks and sit   Chief Complaint: Pain, burning to both feet. Constant now. Symptoms: Above Frequency: Several weeks but getting worse. Pertinent Negatives: Patient denies weakness Disposition: [] ED /[] Urgent Care (no appt availability in office) / [x] Appointment(In office/virtual)/ []  [facility]/ [] Home Care/ [] Refused Recommended Disposition /[] [facility]/ []  Follow-up with PCP Additional Notes: Agrees with appointment.  Reason for Disposition  [1] MODERATE pain (e.g., interferes with normal activities, limping) AND [2] present > 3 days  Answer Assessment - Initial Assessment Questions 1. ONSET: "When did the pain start?"      6 months ago 2. LOCATION: "Where is the pain located?"      Both feet 3. PAIN: "How bad is the pain?"    (Scale 1-10; or mild, moderate, severe)  - MILD (1-3): doesn't interfere with normal activities.   - MODERATE (4-7): interferes with normal activities (e.g., work or school) or awakens from sleep, limping.   - SEVERE (8-10): excruciating pain, unable to do any normal activities, unable to walk.      9 4. WORK OR EXERCISE: "Has there been any recent work or exercise that involved this part of the body?"      No 5. CAUSE: "What do you think is causing the foot pain?"     Unsure 6. OTHER SYMPTOMS: "Do you have any other symptoms?" (e.g., leg pain, rash, fever, numbness)     Constant 7. PREGNANCY: "Is there any chance you are pregnant?" "When was your last menstrual period?"     N/a  Protocols used: Foot Pain-A-AH

## 2023-11-19 ENCOUNTER — Ambulatory Visit (INDEPENDENT_AMBULATORY_CARE_PROVIDER_SITE_OTHER): Admitting: Nurse Practitioner

## 2023-11-19 ENCOUNTER — Encounter: Payer: Self-pay | Admitting: Nurse Practitioner

## 2023-11-19 VITALS — BP 140/60 | HR 69 | Resp 18 | Ht 72.0 in | Wt 220.7 lb

## 2023-11-19 DIAGNOSIS — R208 Other disturbances of skin sensation: Secondary | ICD-10-CM

## 2023-11-19 DIAGNOSIS — G629 Polyneuropathy, unspecified: Secondary | ICD-10-CM | POA: Diagnosis not present

## 2023-11-19 MED ORDER — GABAPENTIN 100 MG PO CAPS
100.0000 mg | ORAL_CAPSULE | Freq: Three times a day (TID) | ORAL | 0 refills | Status: DC
Start: 2023-11-19 — End: 2023-11-20

## 2023-11-19 NOTE — Patient Instructions (Signed)
 Start gabapentin tonight one dose  Tomorrow take 1 in the morning and 1 in the evening  Third day can take 3 times a day

## 2023-11-20 ENCOUNTER — Telehealth: Payer: Self-pay

## 2023-11-20 ENCOUNTER — Other Ambulatory Visit: Payer: Self-pay | Admitting: Nurse Practitioner

## 2023-11-20 DIAGNOSIS — G629 Polyneuropathy, unspecified: Secondary | ICD-10-CM

## 2023-11-20 DIAGNOSIS — R208 Other disturbances of skin sensation: Secondary | ICD-10-CM

## 2023-11-20 DIAGNOSIS — G4733 Obstructive sleep apnea (adult) (pediatric): Secondary | ICD-10-CM | POA: Diagnosis not present

## 2023-11-20 LAB — VITAMIN B12: Vitamin B-12: 546 pg/mL (ref 200–1100)

## 2023-11-20 LAB — TSH: TSH: 0.4 m[IU]/L (ref 0.40–4.50)

## 2023-11-20 MED ORDER — PREGABALIN 25 MG PO CAPS
25.0000 mg | ORAL_CAPSULE | Freq: Two times a day (BID) | ORAL | 0 refills | Status: DC
Start: 2023-11-20 — End: 2024-03-22

## 2023-11-20 NOTE — Telephone Encounter (Signed)
 Copied from CRM [id number]. Topic: Clinical - Medication Question >> Nov 20, 2023 10:56 AM Patsy Lager T wrote: Reason for CRM: patient said provider prescribed gabapentin (NEURONTIN) 100 MG capsule he took a pill and started feeling dizzy with a headache and he read the side effects so he no longer wants to take the meds. He request an alternate med.

## 2023-11-20 NOTE — Telephone Encounter (Signed)
 DO YOU WANT HIM TO TRY AGAIN?

## 2023-11-20 NOTE — Telephone Encounter (Signed)
 Left detailed vm

## 2023-11-26 DIAGNOSIS — K5900 Constipation, unspecified: Secondary | ICD-10-CM | POA: Diagnosis not present

## 2023-11-26 DIAGNOSIS — I129 Hypertensive chronic kidney disease with stage 1 through stage 4 chronic kidney disease, or unspecified chronic kidney disease: Secondary | ICD-10-CM | POA: Diagnosis not present

## 2023-11-26 DIAGNOSIS — N1832 Chronic kidney disease, stage 3b: Secondary | ICD-10-CM | POA: Diagnosis not present

## 2023-12-03 ENCOUNTER — Emergency Department

## 2023-12-03 ENCOUNTER — Encounter: Payer: Self-pay | Admitting: *Deleted

## 2023-12-03 ENCOUNTER — Emergency Department
Admission: EM | Admit: 2023-12-03 | Discharge: 2023-12-04 | Disposition: A | Discharge status: Home / Self Care | Attending: Emergency Medicine | Admitting: Emergency Medicine

## 2023-12-03 ENCOUNTER — Other Ambulatory Visit: Payer: Self-pay

## 2023-12-03 DIAGNOSIS — Z8673 Personal history of transient ischemic attack (TIA), and cerebral infarction without residual deficits: Secondary | ICD-10-CM | POA: Insufficient documentation

## 2023-12-03 DIAGNOSIS — I6523 Occlusion and stenosis of bilateral carotid arteries: Secondary | ICD-10-CM | POA: Diagnosis not present

## 2023-12-03 DIAGNOSIS — E871 Hypo-osmolality and hyponatremia: Secondary | ICD-10-CM | POA: Diagnosis not present

## 2023-12-03 DIAGNOSIS — I700 Atherosclerosis of aorta: Secondary | ICD-10-CM | POA: Diagnosis not present

## 2023-12-03 DIAGNOSIS — R531 Weakness: Secondary | ICD-10-CM | POA: Diagnosis not present

## 2023-12-03 DIAGNOSIS — I6782 Cerebral ischemia: Secondary | ICD-10-CM | POA: Diagnosis not present

## 2023-12-03 DIAGNOSIS — S199XXA Unspecified injury of neck, initial encounter: Secondary | ICD-10-CM | POA: Diagnosis not present

## 2023-12-03 DIAGNOSIS — Z5181 Encounter for therapeutic drug level monitoring: Secondary | ICD-10-CM | POA: Insufficient documentation

## 2023-12-03 DIAGNOSIS — S0990XA Unspecified injury of head, initial encounter: Secondary | ICD-10-CM | POA: Diagnosis not present

## 2023-12-03 DIAGNOSIS — W19XXXA Unspecified fall, initial encounter: Secondary | ICD-10-CM

## 2023-12-03 DIAGNOSIS — Z7902 Long term (current) use of antithrombotics/antiplatelets: Secondary | ICD-10-CM | POA: Insufficient documentation

## 2023-12-03 DIAGNOSIS — R200 Anesthesia of skin: Secondary | ICD-10-CM | POA: Insufficient documentation

## 2023-12-03 DIAGNOSIS — R29898 Other symptoms and signs involving the musculoskeletal system: Secondary | ICD-10-CM

## 2023-12-03 LAB — CBC
HCT: 34.3 % — ABNORMAL LOW (ref 39.0–52.0)
Hemoglobin: 11.6 g/dL — ABNORMAL LOW (ref 13.0–17.0)
MCH: 29 pg (ref 26.0–34.0)
MCHC: 33.8 g/dL (ref 30.0–36.0)
MCV: 85.8 fL (ref 80.0–100.0)
Platelets: 310 10*3/uL (ref 150–400)
RBC: 4 MIL/uL — ABNORMAL LOW (ref 4.22–5.81)
RDW: 11.9 % (ref 11.5–15.5)
WBC: 5.1 10*3/uL (ref 4.0–10.5)
nRBC: 0 % (ref 0.0–0.2)

## 2023-12-03 LAB — DIFFERENTIAL
Abs Immature Granulocytes: 0.03 10*3/uL (ref 0.00–0.07)
Basophils Absolute: 0 10*3/uL (ref 0.0–0.1)
Basophils Relative: 1 %
Eosinophils Absolute: 0.1 10*3/uL (ref 0.0–0.5)
Eosinophils Relative: 2 %
Immature Granulocytes: 1 %
Lymphocytes Relative: 29 %
Lymphs Abs: 1.4 10*3/uL (ref 0.7–4.0)
Monocytes Absolute: 0.4 10*3/uL (ref 0.1–1.0)
Monocytes Relative: 8 %
Neutro Abs: 3.1 10*3/uL (ref 1.7–7.7)
Neutrophils Relative %: 59 %

## 2023-12-03 LAB — COMPREHENSIVE METABOLIC PANEL WITH GFR
ALT: 20 U/L (ref 0–44)
AST: 23 U/L (ref 15–41)
Albumin: 4.2 g/dL (ref 3.5–5.0)
Alkaline Phosphatase: 54 U/L (ref 38–126)
Anion gap: 7 (ref 5–15)
BUN: 20 mg/dL (ref 8–23)
CO2: 23 mmol/L (ref 22–32)
Calcium: 9.5 mg/dL (ref 8.9–10.3)
Chloride: 103 mmol/L (ref 98–111)
Creatinine, Ser: 1.35 mg/dL — ABNORMAL HIGH (ref 0.61–1.24)
GFR, Estimated: 53 mL/min — ABNORMAL LOW (ref >60)
Glucose, Bld: 100 mg/dL — ABNORMAL HIGH (ref 70–99)
Potassium: 4.1 mmol/L (ref 3.5–5.1)
Sodium: 133 mmol/L — ABNORMAL LOW (ref 135–145)
Total Bilirubin: 0.6 mg/dL (ref 0.0–1.2)
Total Protein: 7.3 g/dL (ref 6.5–8.1)

## 2023-12-03 LAB — ETHANOL: Alcohol, Ethyl (B): <10 mg/dL (ref <10)

## 2023-12-03 LAB — APTT: aPTT: 33 s (ref 24–36)

## 2023-12-03 LAB — CK: Total CK: 242 U/L (ref 49–397)

## 2023-12-03 LAB — PROTIME-INR
INR: 1 (ref 0.8–1.2)
Prothrombin Time: 13.3 s (ref 11.4–15.2)

## 2023-12-03 LAB — TROPONIN I (HIGH SENSITIVITY): Troponin I (High Sensitivity): 10 ng/L (ref <18)

## 2023-12-03 NOTE — ED Provider Notes (Addendum)
 [facility] Provider Note    Event Date/Time   First MD Initiated Contact with Patient 12/03/23 1949     (approximate)   History   Fall and Weakness   HPI  Jared Cline is a 78 y.o. male with history of stroke on Plavix who comes in with concerns for left leg numbness, weakness.  Patient reported around 2 PM he noticed some increasing weakness in his left leg as well as some numbness from the knee down.  He states that he typically walks with a roller walker and that he was at home when he thinks that he slipped because of his socks he was wearing. He denies any loss of consciousness.  He states that he feels it was contributed secondary to the left leg weakness, numbness.  He could not get up off the ground so family had to come and help him he was only on the ground for about 15 minutes.  Reports having some baseline weakness in the left leg but denies any tingling before.  He denies any back pain, urinary, rectal incontinence, saddle anesthesia.     Physical Exam   Triage Vital Signs: ED Triage Vitals  Encounter Vitals Group     BP 12/03/23 1941 (!) 158/77     Systolic BP Percentile --      Diastolic BP Percentile --      Pulse Rate 12/03/23 1941 81     Resp 12/03/23 1941 20     Temp 12/03/23 1941 98.3 F (36.8 C)     Temp Source 12/03/23 1941 Oral     SpO2 12/03/23 1941 97 %     Weight 12/03/23 1938 220 lb (99.8 kg)     Height 12/03/23 1938 6' (1.829 m)     Head Circumference --      Peak Flow --      Pain Score 12/03/23 1938 0     Pain Loc --      Pain Education --      Exclude from Growth Chart --     Most recent vital signs: Vitals:   12/03/23 1941 12/03/23 1948  BP: (!) 158/77 (!) 158/77  Pulse: 81 81  Resp: 20 18  Temp: 98.3 F (36.8 C) 98.3 F (36.8 C)  SpO2: 97% 95%     General: Awake, no distress.  CV:  Good peripheral perfusion.  Resp:  Normal effort.  Abd:  No distention.  Other:  Left leg is weaker than the right with  some sensation changes below the knee.  Good distal pulse.  Equal arm strength no pronator drift.  Cranial nerves otherwise intact. No chest wall tenderness no abdominal tenderness no extremity tenderness.  ED Results / Procedures / Treatments   Labs (all labs ordered are listed, but only abnormal results are displayed) Labs Reviewed  CBC - Abnormal; Notable for the following components:      Result Value   RBC 4.00 (*)    Hemoglobin 11.6 (*)    HCT 34.3 (*)    All other components within normal limits  PROTIME-INR  APTT  DIFFERENTIAL  ETHANOL  COMPREHENSIVE METABOLIC PANEL WITH GFR  CK  TROPONIN I (HIGH SENSITIVITY)     EKG  My interpretation of EKG:  Sinus rate of 77 without any ST elevation or T wave inversions, normal intervals  RADIOLOGY I have reviewed the ct personally and interpreted no evidence of intracranial hemorrhage   PROCEDURES:  Critical Care performed: No  Procedures  MEDICATIONS ORDERED IN ED: Medications - No data to display   IMPRESSION / MDM / ASSESSMENT AND PLAN / ED COURSE  I reviewed the triage vital signs and the nursing notes.   Patient's presentation is most consistent with acute presentation with potential threat to life or bodily function.   Patient comes in for a fall sounds mechanical in nature but given his weakness and difficulty getting up off the ground we will get labs, cardiac markers, CT imaging to further evaluate for intracranial hemorrhage, cervical fracture stroke code was not called given last known normal was 2 PM so out of the window for TNK.  No evidence of LVO based upon examination. No symptoms of cord compression. Denies any urinary symptoms to suggest UTI    Alcohol negative.  CBC shows stable hemoglobin.  CMP shows some slight low sodium and creatinine is improving from baseline.  Patient does have a little bit of edema noted in his legs that is baseline for patient but did not want to give any IV fluid.  Coags  are normal CT imaging is negative  IMPRESSION: 1. No acute intracranial abnormality. 2. No acute displaced fracture or traumatic listhesis of the cervical spine.  10:14 PM  Pt handed off pending MRI read-repeat evaluation patient has normal sensation bilaterally and leg has good strength in it we discussed the possibility of TIA versus recrudescents.  We discussed admission to the hospital for TIA workup but patient is already on Plavix and feels comfortable with discharge home given seems pretty similar to his prior stroke.    MRI was negative.  Patient handed off pending ambulation trial, repeat troponin and suspect discharge home   The patient is on the cardiac monitor to evaluate for evidence of arrhythmia and/or significant heart rate changes.      FINAL CLINICAL IMPRESSION(S) / ED DIAGNOSES   Final diagnoses:  Fall, initial encounter     Rx / DC Orders   ED Discharge Orders     None        Note:  This document was prepared using Dragon voice recognition software and may include unintentional dictation errors.   Lubertha Rush, MD 12/03/23 2310    Lubertha Rush, MD 12/03/23 2312

## 2023-12-03 NOTE — ED Notes (Signed)
 Patient transported to CT

## 2023-12-03 NOTE — ED Triage Notes (Addendum)
 Pt to triage via wheelchair.  Pt fell at home.  Pt states he turned to fast and fell onto carpeted floor at 1900 today..  No loc  no chest pain or sob.  Pt reports numbness in left leg.  Pt ambulated with rolling walker into ER lobby and was placed in wheelchair.  Pt alert   Hx cva with left side deficit.   No headache  pt has dizziness.

## 2023-12-03 NOTE — Discharge Instructions (Signed)
 Please call your neurologist to make a follow-up appointment to discuss what happened.  I do not see any evidence of active stroke today however this could be a TIA or could just be related to your old prior stroke.  We discussed admission to the hospital get but given resolution of symptoms you felt comfortable going home however if the symptoms are returning you need to return to the ER immediately.  Return to the ER for worsening symptoms or any other concerns

## 2023-12-03 NOTE — ED Provider Notes (Signed)
-----------------------------------------   11:40 PM on 12/03/2023 -----------------------------------------   Assumed care of patient who is pending repeat troponin ambulation trial.  ----------------------------------------- 12:59 AM on 12/04/2023 -----------------------------------------   Repeat troponin remains negative. Patient ambulated with walker in his usual state; eager for discharge home.  Strict return precautions given.  Patient and family members verbalized understanding and agree with plan of care.   Boots Mcglown J, MD 12/04/23 0630

## 2023-12-03 NOTE — ED Notes (Signed)
 Ambulated patient around his room. Patient denies any dizziness, gait slightly unsteady, but patient states that this is his baseline and he does not feel any weaker or more unsteady than he normally does. Patient states he does ambulate with a walker at home.

## 2023-12-04 ENCOUNTER — Encounter: Payer: Self-pay | Admitting: Internal Medicine

## 2023-12-04 LAB — TROPONIN I (HIGH SENSITIVITY): Troponin I (High Sensitivity): 14 ng/L (ref <18)

## 2023-12-12 ENCOUNTER — Encounter: Payer: Self-pay | Admitting: Nurse Practitioner

## 2023-12-12 ENCOUNTER — Ambulatory Visit
Admission: RE | Admit: 2023-12-12 | Discharge: 2023-12-12 | Disposition: A | Discharge status: Home / Self Care | Source: Ambulatory Visit | Attending: Nurse Practitioner | Admitting: Nurse Practitioner

## 2023-12-12 ENCOUNTER — Ambulatory Visit (INDEPENDENT_AMBULATORY_CARE_PROVIDER_SITE_OTHER): Admitting: Nurse Practitioner

## 2023-12-12 ENCOUNTER — Ambulatory Visit: Payer: Self-pay

## 2023-12-12 VITALS — BP 144/80 | HR 81 | Temp 98.1°F | Resp 18 | Ht 72.0 in | Wt 224.3 lb

## 2023-12-12 DIAGNOSIS — I10 Essential (primary) hypertension: Secondary | ICD-10-CM

## 2023-12-12 DIAGNOSIS — N289 Disorder of kidney and ureter, unspecified: Secondary | ICD-10-CM | POA: Diagnosis not present

## 2023-12-12 DIAGNOSIS — N281 Cyst of kidney, acquired: Secondary | ICD-10-CM | POA: Diagnosis not present

## 2023-12-12 DIAGNOSIS — R1084 Generalized abdominal pain: Secondary | ICD-10-CM | POA: Insufficient documentation

## 2023-12-12 DIAGNOSIS — R109 Unspecified abdominal pain: Secondary | ICD-10-CM | POA: Diagnosis not present

## 2023-12-12 MED ORDER — IOHEXOL 300 MG/ML  SOLN
100.0000 mL | Freq: Once | INTRAMUSCULAR | Status: AC | PRN
  Administered 2023-12-12: 100 mL via INTRAVENOUS

## 2023-12-12 NOTE — Telephone Encounter (Signed)
  Chief Complaint: abdominal pain Symptoms: abdominal pain Frequency: comes and goes, worsening over the last month Pertinent Negatives: Patient denies diarrhea, vomiting, fever Disposition: [] ED /[] Urgent Care (no appt availability in office) / [x] Appointment(In office/virtual)/ []  [facility]/ [] Home Care/ [] Refused Recommended Disposition /[]  Mobile Bus/ []  Follow-up with PCP Additional Notes: Patient reports he was seen at the beginning of the month for abdominal pain and was prescribed linzess  which is not helping his symptoms. Patient reports pain has been worsening over the last month. States pain is severe and comes and goes, typically occurring first thing in the morning. Per protocol, appt scheduled in office today 12/12/23. Patient advised to call back with worsening symptoms. Patient verbalized understanding.   Copied from CRM [id number]. Topic: Clinical - Red Word Triage >> Dec 12, 2023 11:56 AM Elle L wrote: Red Word that prompted transfer to Nurse Triage: The patient's home health nurse advised the patient to call as his blood pressure is high on Monday it was 177/87, Tuesday it was 175/82, Wednesday it was 175/85, Thursday it was 178/84, and today it is 173/90. He also advised he was given Linzess  for stomach pain. However, it is not helping with the pain and it is worsening as it is causing him to have diarrhea. Reason for Disposition  Abdominal pain is a chronic symptom (recurrent or ongoing AND present > 4 weeks)  Answer Assessment - Initial Assessment Questions 1. LOCATION: "Where does it hurt?"      All over my stomach 2. RADIATION: "Does the pain shoot anywhere else?" (e.g., chest, back)     denies 3. ONSET: "When did the pain begin?" (Minutes, hours or days ago)      Worsening over the last month 4. SUDDEN: "Gradual or sudden onset?"     gradual 5. PATTERN "Does the pain come and go, or is it constant?"    - If it comes and goes: "How long does it  last?" "Do you have pain now?"     (Note: Comes and goes means the pain is intermittent. It goes away completely between bouts.)    - If constant: "Is it getting better, staying the same, or getting worse?"      (Note: Constant means the pain never goes away completely; most serious pain is constant and gets worse.)      Comes and goes 6. SEVERITY: "How bad is the pain?"  (e.g., Scale 1-10; mild, moderate, or severe)    - MILD (1-3): Doesn't interfere with normal activities, abdomen soft and not tender to touch.     - MODERATE (4-7): Interferes with normal activities or awakens from sleep, abdomen tender to touch.     - SEVERE (8-10): Excruciating pain, doubled over, unable to do any normal activities.      severe 7. RECURRENT SYMPTOM: "Have you ever had this type of stomach pain before?" If Yes, ask: "When was the last time?" and "What happened that time?"      yes 8. CAUSE: "What do you think is causing the stomach pain?"     unsure 9. RELIEVING/AGGRAVATING FACTORS: "What makes it better or worse?" (e.g., antacids, bending or twisting motion, bowel movement)     denies 10. OTHER SYMPTOMS: "Do you have any other symptoms?" (e.g., back pain, diarrhea, fever, urination pain, vomiting)       denies  Protocols used: Abdominal Pain - Male-A-AH

## 2023-12-12 NOTE — Progress Notes (Signed)
 BP (!) 144/80   Pulse 81   Temp 98.1 F (36.7 C)   Resp 18   Ht 6' (1.829 m)   Wt 224 lb 4.8 oz (101.7 kg)   SpO2 99%   BMI 30.42 kg/m    Subjective:    Patient ID: Jared Cline, male    DOB: [birth year], 78 y.o.   MRN: [id number]  HPI: Jared Cline is a 78 y.o. male  Chief Complaint  Patient presents with   Abdominal Pain    Same states having regular bowel movements w/ linzess  and is schedule for colonoscopy next week    Discussed the use of AI scribe software for clinical note transcription with the patient, who gave verbal consent to proceed.  History of Present Illness Jared Cline is a 78 year old male who presents with abdominal pain.  He experiences persistent abdominal pain, described as sharp and located around the umbilicus, with generalized tenderness across the abdomen. The pain is similar to previous episodes but currently more severe. No fever, nausea, or vomiting. He has a history of constipation, for which he was prescribed Linzess , effectively normalizing his bowel movements.  He recalls a previous visit to the emergency department on October 15, 2023, where an abdominal ct scan indicated constipation. He was seen again on November 13, 2023, with similar complaints, and the x-ray at that time also showed a stool burden. Despite the resolution of constipation, the abdominal pain persists.  He is currently taking multiple medications for blood pressure management, including hydralazine , valsartan , labetalol , and clonidine . His blood pressure was elevated recently, but he continues to take his medications as prescribed.   No urinary problems, chest pain, or shortness of breath.         07/03/2023    9:04 AM 04/30/2023    2:16 PM 03/31/2023   10:21 AM  Depression screen PHQ 2/9  Decreased Interest 0 0 0  Down, Depressed, Hopeless 0 0 0  PHQ - 2 Score 0 0 0  Altered sleeping 0 0   Tired, decreased energy 0 0   Change in appetite 0 0   Feeling bad or failure  about yourself  0 0   Trouble concentrating 0 0   Moving slowly or fidgety/restless 0 0   Suicidal thoughts 0 0   PHQ-9 Score 0 0   Difficult doing work/chores Not difficult at all      Relevant past medical, surgical, family and social history reviewed and updated as indicated. Interim medical history since our last visit reviewed. Allergies and medications reviewed and updated.  Review of Systems  Ten systems reviewed and is negative except as mentioned in HPI      Objective:    BP (!) 144/80   Pulse 81   Temp 98.1 F (36.7 C)   Resp 18   Ht 6' (1.829 m)   Wt 224 lb 4.8 oz (101.7 kg)   SpO2 99%   BMI 30.42 kg/m    Wt Readings from Last 3 Encounters:  12/12/23 224 lb 4.8 oz (101.7 kg)  12/03/23 220 lb (99.8 kg)  11/19/23 220 lb 11.2 oz (100.1 kg)    Physical Exam Physical Exam GENERAL: Alert, cooperative, well developed, no acute distress HEENT: Normocephalic, normal oropharynx, moist mucous membranes CHEST: Clear to auscultation bilaterally, no wheezes, rhonchi, or crackles CARDIOVASCULAR: Normal heart rate and rhythm, S1 and S2 normal without murmurs ABDOMEN: Generalized abdominal tenderness, bowel sounds hypoactive EXTREMITIES: No cyanosis or edema NEUROLOGICAL: Cranial nerves grossly  intact, moves all extremities without gross motor or sensory deficit   Results for orders placed or performed during the hospital encounter of 12/03/23  CBC   Collection Time: 12/03/23  7:43 PM  Result Value Ref Range   WBC 5.1 4.0 - 10.5 K/uL   RBC 4.00 (L) 4.22 - 5.81 MIL/uL   Hemoglobin 11.6 (L) 13.0 - 17.0 g/dL   HCT 16.1 (L) 09.6 - 04.5 %   MCV 85.8 80.0 - 100.0 fL   MCH 29.0 26.0 - 34.0 pg   MCHC 33.8 30.0 - 36.0 g/dL   RDW 40.9 81.1 - 91.4 %   Platelets 310 150 - 400 K/uL   nRBC 0.0 0.0 - 0.2 %  Comprehensive metabolic panel with GFR   Collection Time: 12/03/23  7:43 PM  Result Value Ref Range   Sodium 133 (L) 135 - 145 mmol/L   Potassium 4.1 3.5 - 5.1 mmol/L    Chloride 103 98 - 111 mmol/L   CO2 23 22 - 32 mmol/L   Glucose, Bld 100 (H) 70 - 99 mg/dL   BUN 20 8 - 23 mg/dL   Creatinine, Ser 7.82 (H) 0.61 - 1.24 mg/dL   Calcium  9.5 8.9 - 10.3 mg/dL   Total Protein 7.3 6.5 - 8.1 g/dL   Albumin 4.2 3.5 - 5.0 g/dL   AST 23 15 - 41 U/L   ALT 20 0 - 44 U/L   Alkaline Phosphatase 54 38 - 126 U/L   Total Bilirubin 0.6 0.0 - 1.2 mg/dL   GFR, Estimated 53 (L) >60 mL/min   Anion gap 7 5 - 15  Protime-INR   Collection Time: 12/03/23  7:43 PM  Result Value Ref Range   Prothrombin Time 13.3 11.4 - 15.2 seconds   INR 1.0 0.8 - 1.2  APTT   Collection Time: 12/03/23  7:43 PM  Result Value Ref Range   aPTT 33 24 - 36 seconds  CK   Collection Time: 12/03/23  7:43 PM  Result Value Ref Range   Total CK 242 49 - 397 U/L  Differential   Collection Time: 12/03/23  7:43 PM  Result Value Ref Range   Neutrophils Relative % 59 %   Neutro Abs 3.1 1.7 - 7.7 K/uL   Lymphocytes Relative 29 %   Lymphs Abs 1.4 0.7 - 4.0 K/uL   Monocytes Relative 8 %   Monocytes Absolute 0.4 0.1 - 1.0 K/uL   Eosinophils Relative 2 %   Eosinophils Absolute 0.1 0.0 - 0.5 K/uL   Basophils Relative 1 %   Basophils Absolute 0.0 0.0 - 0.1 K/uL   Immature Granulocytes 1 %   Abs Immature Granulocytes 0.03 0.00 - 0.07 K/uL  Troponin I (High Sensitivity)   Collection Time: 12/03/23  7:43 PM  Result Value Ref Range   Troponin I (High Sensitivity) 10 <18 ng/L  Ethanol   Collection Time: 12/03/23  8:02 PM  Result Value Ref Range   Alcohol, Ethyl (B) <10 <10 mg/dL  Troponin I (High Sensitivity)   Collection Time: 12/03/23 11:10 PM  Result Value Ref Range   Troponin I (High Sensitivity) 14 <18 ng/L       Assessment & Plan:   Problem List Items Addressed This Visit       Cardiovascular and Mediastinum   Hypertension (Chronic)     Other   Abdominal pain - Primary   Relevant Orders   CT ABDOMEN PELVIS W CONTRAST   CBC with Differential/Platelet   Comprehensive metabolic  panel with GFR   Lipase     Assessment and Plan Assessment & Plan Abdominal pain Persistent periumbilical abdominal pain with generalized tenderness. Previous imaging indicated constipation, but current bowel movements are normal with Linzess . No fever, nausea, or vomiting. Differential diagnosis includes other causes of abdominal pain as constipation is resolved. Previously evaluated in the emergency department with an abdominal ct scan showing stool burden. A CT scan is planned to further investigate the cause of pain. - Order CT scan of abdomen and pelvis - Perform blood work - Conduct urine dip test -patient was unable to provide urine sample  Hypertension Blood pressure remains elevated despite current regimen of hydralazine , valsartan , labetalol , and clonidine . Recent reading was 144/86 mmHg, indicating suboptimal control. He reports taking hydralazine  when blood pressure is particularly high. -Instructed patient that he can take another hydralazine  -continue taking clonidine  0.2 mg twice a day -labetalol  100 mg twice a day -valsartan  160 mg daily -hydralazine  10 mg twice a day as needed for blood pressure greater than 140/90  General Health Maintenance Colonoscopy scheduled for Wednesday.        Follow up plan: Return if symptoms worsen or fail to improve.

## 2023-12-13 LAB — COMPREHENSIVE METABOLIC PANEL WITH GFR
AG Ratio: 1.7 (calc) (ref 1.0–2.5)
ALT: 15 U/L (ref 9–46)
AST: 17 U/L (ref 10–35)
Albumin: 4.3 g/dL (ref 3.6–5.1)
Alkaline phosphatase (APISO): 53 U/L (ref 35–144)
BUN/Creatinine Ratio: 12 (calc) (ref 6–22)
BUN: 18 mg/dL (ref 7–25)
CO2: 27 mmol/L (ref 20–32)
Calcium: 10 mg/dL (ref 8.6–10.3)
Chloride: 102 mmol/L (ref 98–110)
Creat: 1.47 mg/dL — ABNORMAL HIGH (ref 0.70–1.28)
Globulin: 2.6 g/dL (ref 1.9–3.7)
Glucose, Bld: 98 mg/dL (ref 65–99)
Potassium: 4.5 mmol/L (ref 3.5–5.3)
Sodium: 135 mmol/L (ref 135–146)
Total Bilirubin: 0.4 mg/dL (ref 0.2–1.2)
Total Protein: 6.9 g/dL (ref 6.1–8.1)
eGFR: 48 mL/min/{1.73_m2} — ABNORMAL LOW (ref >=60)

## 2023-12-13 LAB — CBC WITH DIFFERENTIAL/PLATELET
Absolute Lymphocytes: 1978 {cells}/uL (ref 850–3900)
Absolute Monocytes: 592 {cells}/uL (ref 200–950)
Basophils Absolute: 32 {cells}/uL (ref 0–200)
Basophils Relative: 0.5 %
Eosinophils Absolute: 107 {cells}/uL (ref 15–500)
Eosinophils Relative: 1.7 %
HCT: 35.8 % — ABNORMAL LOW (ref 38.5–50.0)
Hemoglobin: 11.5 g/dL — ABNORMAL LOW (ref 13.2–17.1)
MCH: 28.3 pg (ref 27.0–33.0)
MCHC: 32.1 g/dL (ref 32.0–36.0)
MCV: 88.2 fL (ref 80.0–100.0)
MPV: 9.8 fL (ref 7.5–12.5)
Monocytes Relative: 9.4 %
Neutro Abs: 3591 {cells}/uL (ref 1500–7800)
Neutrophils Relative %: 57 %
Platelets: 346 10*3/uL (ref 140–400)
RBC: 4.06 10*6/uL — ABNORMAL LOW (ref 4.20–5.80)
RDW: 12 % (ref 11.0–15.0)
Total Lymphocyte: 31.4 %
WBC: 6.3 10*3/uL (ref 3.8–10.8)

## 2023-12-13 LAB — LIPASE: Lipase: 23 U/L (ref 7–60)

## 2023-12-16 ENCOUNTER — Ambulatory Visit: Payer: Self-pay

## 2023-12-16 NOTE — Telephone Encounter (Signed)
 fyi

## 2023-12-16 NOTE — Telephone Encounter (Signed)
  Chief Complaint: DME request Symptoms: pain Frequency: chronic Pertinent Negatives: Patient denies new symptoms Disposition: [] ED /[] Urgent Care (no appt availability in office) / [] Appointment(In office/virtual)/ []  [facility]/ [] Home Care/ [] Refused Recommended Disposition /[] [facility]/ [x]  Follow-up with PCP Additional Notes:  Spoke with patient he is requesting a TENS unit. His wife has a unit that he tried and it worked real well for him. Bilateral knee pain from arthritis, he has already been evaluated in the past, no new symptoms does not feel he needs an evaluation. He uses otc Tylenol  arthritis for pain management but its only minimally effective, requesting TENS unit that will be covered by insurance as he finds the most relief from using this. Next OV is scheduled on 03/22/24, please advise if you would like an acute appointment scheduled and please follow up with Primus Brookes for DME request and any recommendation.   Copied from CRM [id number]. Topic: General - Other >> Dec 16, 2023 12:52 PM Geneva B wrote: Reason for CRM: patient is calling to see if he can get something for [employer] supply standard please call [phone] Reason for Disposition  Knee pain is a chronic symptom (recurrent or ongoing AND present > 4 weeks)  Protocols used: Knee Pain-A-AH

## 2023-12-16 NOTE — Telephone Encounter (Signed)
 Not sure on even how to order this? Does he need to talk to ortho?

## 2023-12-17 ENCOUNTER — Encounter
Admission: RE | Disposition: A | Discharge status: Home / Self Care | Payer: Self-pay | Source: Home / Self Care | Attending: Internal Medicine

## 2023-12-17 ENCOUNTER — Ambulatory Visit: Admitting: General Practice

## 2023-12-17 ENCOUNTER — Ambulatory Visit
Admission: RE | Admit: 2023-12-17 | Discharge: 2023-12-17 | Disposition: A | Discharge status: Home / Self Care | Payer: Medicare Other | Attending: Internal Medicine | Admitting: Internal Medicine

## 2023-12-17 DIAGNOSIS — I440 Atrioventricular block, first degree: Secondary | ICD-10-CM | POA: Diagnosis not present

## 2023-12-17 DIAGNOSIS — K640 First degree hemorrhoids: Secondary | ICD-10-CM | POA: Insufficient documentation

## 2023-12-17 DIAGNOSIS — K449 Diaphragmatic hernia without obstruction or gangrene: Secondary | ICD-10-CM | POA: Diagnosis not present

## 2023-12-17 DIAGNOSIS — K222 Esophageal obstruction: Secondary | ICD-10-CM | POA: Insufficient documentation

## 2023-12-17 DIAGNOSIS — K224 Dyskinesia of esophagus: Secondary | ICD-10-CM | POA: Diagnosis not present

## 2023-12-17 DIAGNOSIS — K581 Irritable bowel syndrome with constipation: Secondary | ICD-10-CM | POA: Diagnosis not present

## 2023-12-17 DIAGNOSIS — R1314 Dysphagia, pharyngoesophageal phase: Secondary | ICD-10-CM | POA: Diagnosis not present

## 2023-12-17 DIAGNOSIS — K649 Unspecified hemorrhoids: Secondary | ICD-10-CM | POA: Diagnosis not present

## 2023-12-17 DIAGNOSIS — I129 Hypertensive chronic kidney disease with stage 1 through stage 4 chronic kidney disease, or unspecified chronic kidney disease: Secondary | ICD-10-CM | POA: Insufficient documentation

## 2023-12-17 DIAGNOSIS — D509 Iron deficiency anemia, unspecified: Secondary | ICD-10-CM | POA: Diagnosis not present

## 2023-12-17 DIAGNOSIS — D500 Iron deficiency anemia secondary to blood loss (chronic): Secondary | ICD-10-CM | POA: Diagnosis not present

## 2023-12-17 DIAGNOSIS — R1319 Other dysphagia: Secondary | ICD-10-CM | POA: Diagnosis not present

## 2023-12-17 DIAGNOSIS — N182 Chronic kidney disease, stage 2 (mild): Secondary | ICD-10-CM | POA: Diagnosis not present

## 2023-12-17 HISTORY — PX: COLONOSCOPY WITH PROPOFOL: SHX5780

## 2023-12-17 HISTORY — DX: Hypercalcemia: E83.52

## 2023-12-17 HISTORY — DX: Anemia in chronic kidney disease: D63.1

## 2023-12-17 HISTORY — DX: Other pulmonary embolism without acute cor pulmonale: I26.99

## 2023-12-17 HISTORY — DX: Personal history of malignant neoplasm of prostate: Z85.46

## 2023-12-17 HISTORY — PX: ESOPHAGOGASTRODUODENOSCOPY (EGD) WITH PROPOFOL: SHX5813

## 2023-12-17 SURGERY — COLONOSCOPY WITH PROPOFOL
Anesthesia: General

## 2023-12-17 MED ORDER — PHENYLEPHRINE HCL (PRESSORS) 10 MG/ML IV SOLN
INTRAVENOUS | Status: DC | PRN
Start: 2023-12-17 — End: 2023-12-17
  Administered 2023-12-17: 160 ug via INTRAVENOUS

## 2023-12-17 MED ORDER — PROPOFOL 1000 MG/100ML IV EMUL
INTRAVENOUS | Status: AC
  Filled 2023-12-17: qty 400

## 2023-12-17 MED ORDER — LIDOCAINE HCL (CARDIAC) PF 100 MG/5ML IV SOSY
PREFILLED_SYRINGE | INTRAVENOUS | Status: DC | PRN
  Administered 2023-12-17 (×2): 100 mg via INTRAVENOUS

## 2023-12-17 MED ORDER — PROPOFOL 10 MG/ML IV BOLUS
INTRAVENOUS | Status: DC | PRN
  Administered 2023-12-17: 50 mg via INTRAVENOUS
  Administered 2023-12-17: 150 ug/kg/min via INTRAVENOUS

## 2023-12-17 MED ORDER — SODIUM CHLORIDE 0.9 % IV SOLN
INTRAVENOUS | Status: DC
Start: 2023-12-17 — End: 2023-12-17
  Administered 2023-12-17: 20 mL/h via INTRAVENOUS

## 2023-12-17 NOTE — Anesthesia Preprocedure Evaluation (Signed)
 Anesthesia Evaluation  Patient identified by MRN, date of birth, ID band Patient awake    Reviewed: Allergy & Precautions, NPO status , Patient's Chart, lab work & pertinent test results  Airway Mallampati: III  TM Distance: >3 FB Neck ROM: full    Dental  (+) Chipped   Pulmonary sleep apnea , former smoker   Pulmonary exam normal        Cardiovascular hypertension, + CAD, + Peripheral Vascular Disease, +CHF and + DOE  Normal cardiovascular exam+ dysrhythmias      Neuro/Psych CVA  negative psych ROS   GI/Hepatic negative GI ROS, Neg liver ROS,,,  Endo/Other  negative endocrine ROS    Renal/GU Renal disease  negative genitourinary   Musculoskeletal   Abdominal   Peds  Hematology negative hematology ROS (+)   Anesthesia Other Findings Past Medical History: No date: Anemia in chronic kidney disease No date: BPH (benign prostatic hypertrophy) No date: Bradycardia     Comment:  evaluated by Dr. Bary Likes No date: [facility])     Comment:  prostate No date: Carpal tunnel syndrome, left     Comment:  left arm No date: CKD (chronic kidney disease) stage 2, GFR 60-89 ml/min No date: Diverticulitis 2017: Gastritis No date: History of prostate cancer No date: HNP (herniated nucleus pulposus), lumbar No date: Hypercalcemia No date: Hyperlipidemia No date: Hypertension No date: IBS (irritable bowel syndrome) No date: Impotence No date: Multiple lung nodules     Comment:  nonspecific, largest 5mm in Jan 2016; scanned March               2017; rescan 3-6 months later No date: OA (osteoarthritis) of knee     Comment:  right No date: Personal history of prostate cancer No date: Pulmonary embolism (HCC) No date: Renal mass     Comment:  evaluated; followed by urologist No date: Sleep apnea     Comment:  on CPAP No date: Stroke [facility]) No date: Torn meniscus     Comment:  x 2; right knee  Past Surgical History: Oct  2015: abdominal ultrasound     Comment:  Negative for eval for AAA 11/26/2022: CAROTID PTA/STENT INTERVENTION; Right     Comment:  Procedure: CAROTID PTA/STENT INTERVENTION;  Surgeon:               Jackquelyn Mass, MD;  Location: [facility] INVASIVE CV LAB;               Service: Cardiovascular;  Laterality: Right; No date: COLONOSCOPY No date: KNEE ARTHROSCOPY; Right     Comment:  x 2 No date: LUMBAR LAMINECTOMY No date: PROSTATECTOMY     Comment:  robotic  BMI    Body Mass Index: 30.41 kg/m      Reproductive/Obstetrics negative OB ROS                             Anesthesia Physical Anesthesia Plan  ASA: 3  Anesthesia Plan: General   Post-op Pain Management: Minimal or no pain anticipated   Induction: Intravenous  PONV Risk Score and Plan: 3 and Propofol infusion, TIVA and Ondansetron   Airway Management Planned: Nasal Cannula  Additional Equipment: None  Intra-op Plan:   Post-operative Plan:   Informed Consent: I have reviewed the patients History and Physical, chart, labs and discussed the procedure including the risks, benefits and alternatives for the proposed anesthesia with the patient or authorized representative who has indicated  his/her understanding and acceptance.     Dental advisory given  Plan Discussed with: CRNA and Surgeon  Anesthesia Plan Comments: (Discussed risks of anesthesia with patient, including possibility of difficulty with spontaneous ventilation under anesthesia necessitating airway intervention, PONV, and rare risks such as cardiac or respiratory or neurological events, and allergic reactions. Discussed the role of CRNA in patient's perioperative care. Patient understands.)       Anesthesia Quick Evaluation

## 2023-12-17 NOTE — Transfer of Care (Signed)
 Immediate Anesthesia Transfer of Care Note  Patient: Jared Cline  Procedure(s) Performed: COLONOSCOPY WITH PROPOFOL ESOPHAGOGASTRODUODENOSCOPY (EGD) WITH PROPOFOL  Patient Location: PACU  Anesthesia Type:General  Level of Consciousness: drowsy  Airway & Oxygen Therapy: Patient Spontanous Breathing  Post-op Assessment: Report given to RN and Post -op Vital signs reviewed and stable  Post vital signs: stable on room air  Last Vitals:  Vitals Value Taken Time  BP 98/55 12/17/23 0906  Temp 35.7 C 12/17/23 0904  Pulse 61 12/17/23 0907  Resp 10 12/17/23 0907  SpO2 98 % 12/17/23 0907  Vitals shown include unfiled device data.  Last Pain:  Vitals:   12/17/23 0904  TempSrc: Temporal  PainSc: Asleep         Complications: No notable events documented.

## 2023-12-17 NOTE — Op Note (Signed)
 [facility] Gastroenterology Patient Name: Jared Cline Procedure Date: 12/17/2023 8:32 AM MRN: [id number] Account #: [id number] Date of Birth: 21-Oct-1945 Admit Type: Outpatient Age: 78 Room: [facility] ENDO ROOM 1 Gender: Male Note Status: Finalized Instrument Name: Colonoscope [id number] Procedure:             Colonoscopy Indications:           Unexplained iron  deficiency anemia Providers:             Shawndell Varas K. Abbagail Scaff MD, MD Medicines:             Propofol per Anesthesia Complications:         No immediate complications. Estimated blood loss: None. Procedure:             Pre-Anesthesia Assessment:                        - The risks and benefits of the procedure and the                         sedation options and risks were discussed with the                         patient. All questions were answered and informed                         consent was obtained.                        - Patient identification and proposed procedure were                         verified prior to the procedure by the nurse. The                         procedure was verified in the procedure room.                        - ASA Grade Assessment: III - A patient with severe                         systemic disease.                        - After reviewing the risks and benefits, the patient                         was deemed in satisfactory condition to undergo the                         procedure.                        After obtaining informed consent, the colonoscope was                         passed under direct vision. Throughout the procedure,                         the patient's blood pressure, pulse, and oxygen  saturations were monitored continuously. The                         Colonoscope was introduced through the anus and                         advanced to the the cecum, identified by appendiceal                         orifice and ileocecal valve. The  colonoscopy was                         performed without difficulty. The patient tolerated                         the procedure well. The quality of the bowel                         preparation was adequate. [Anatomical Structures]                         photographed. Findings:      The perianal and digital rectal examinations were normal. Pertinent       negatives include normal sphincter tone and no palpable rectal lesions.      Non-bleeding internal hemorrhoids were found during retroflexion. The       hemorrhoids were Grade I (internal hemorrhoids that do not prolapse).      The exam was otherwise without abnormality. Impression:            - Non-bleeding internal hemorrhoids.                        - The examination was otherwise normal.                        - No specimens collected. Recommendation:        - Monitor results to esophageal dilation                        - Await pathology results from EGD, also performed                         today.                        - Patient has a contact number available for                         emergencies. The signs and symptoms of potential                         delayed complications were discussed with the patient.                         Return to normal activities tomorrow. Written                         discharge instructions were provided to the patient.                        -  Resume previous diet.                        - Continue present medications.                        - You do NOT require further colon cancer screening                         measures (Annual stool testing (i.e. hemoccult, FIT,                         cologuard), sigmoidoscopy, colonoscopy or CT                         colonography). You should share this recommendation                         with your Primary Care provider.                        - To visualize the small bowel, perform video capsule                         endoscopy at  appointment to be scheduled.                        - Return to my office at appointment to be scheduled.                        - The findings and recommendations were discussed with                         the patient. Procedure Code(s):     --- Professional ---                        [phone], Colonoscopy, flexible; diagnostic, including                         collection of specimen(s) by brushing or washing, when                         performed (separate procedure) Diagnosis Code(s):     --- Professional ---                        D50.9, Iron  deficiency anemia, unspecified                        K64.0, First degree hemorrhoids CPT copyright 2022 American Medical Association. All rights reserved. The codes documented in this report are preliminary and upon coder review may  be revised to meet current compliance requirements. Cassie Click MD, MD 12/17/2023 9:03:01 AM This report has been signed electronically. Number of Addenda: 0 Note Initiated On: 12/17/2023 8:32 AM Scope Withdrawal Time: 0 hours 7 minutes 3 seconds  Total Procedure Duration: 0 hours 11 minutes 8 seconds  Estimated Blood Loss:  Estimated blood loss: none.      [facility]

## 2023-12-17 NOTE — Interval H&P Note (Signed)
 History and Physical Interval Note:  12/17/2023 8:23 AM  Jared Cline  has presented today for surgery, with the diagnosis of D50.0 (ICD-10-CM) - Iron  deficiency anemia due to chronic blood loss R13.19 (ICD-10-CM) - Esophageal dysphagia.  The various methods of treatment have been discussed with the patient and family. After consideration of risks, benefits and other options for treatment, the patient has consented to  Procedure(s): COLONOSCOPY WITH PROPOFOL (N/A) ESOPHAGOGASTRODUODENOSCOPY (EGD) WITH PROPOFOL (N/A) as a surgical intervention.  The patient's history has been reviewed, patient examined, no change in status, stable for surgery.  I have reviewed the patient's chart and labs.  Questions were answered to the patient's satisfaction.     [place], Paislea Hatton

## 2023-12-17 NOTE — Anesthesia Postprocedure Evaluation (Signed)
 Anesthesia Post Note  Patient: Jared Cline  Procedure(s) Performed: COLONOSCOPY WITH PROPOFOL ESOPHAGOGASTRODUODENOSCOPY (EGD) WITH PROPOFOL  Patient location during evaluation: Endoscopy Anesthesia Type: General Level of consciousness: awake and alert Pain management: pain level controlled Vital Signs Assessment: post-procedure vital signs reviewed and stable Respiratory status: spontaneous breathing, nonlabored ventilation, respiratory function stable and patient connected to nasal cannula oxygen Cardiovascular status: blood pressure returned to baseline and stable Postop Assessment: no apparent nausea or vomiting Anesthetic complications: no  There were no known notable events for this encounter.   Last Vitals:  Vitals:   12/17/23 0731 12/17/23 0904  BP: (!) 164/70 (!) 98/55  Pulse: 88 63  Resp: 18   Temp: (!) 36.1 C (!) 35.7 C  SpO2: 100% 98%    Last Pain:  Vitals:   12/17/23 0904  TempSrc: Temporal  PainSc: Asleep                 Enrique Harvest

## 2023-12-17 NOTE — Op Note (Signed)
 [facility] Gastroenterology Patient Name: Jared Cline Procedure Date: 12/17/2023 8:32 AM MRN: [id number] Account #: [id number] Date of Birth: 04/06/1946 Admit Type: Outpatient Age: 78 Room: [facility] ENDO ROOM 1 Gender: Male Note Status: Finalized Instrument Name: Upper Endoscope [id number] Procedure:             Upper GI endoscopy Indications:           Unexplained iron  deficiency anemia, Dysphagia Providers:             Kenzie Flakes K. Shaneequa Bahner MD, MD Medicines:             Propofol per Anesthesia Complications:         No immediate complications. Estimated blood loss:                         Minimal. Procedure:             Pre-Anesthesia Assessment:                        - The risks and benefits of the procedure and the                         sedation options and risks were discussed with the                         patient. All questions were answered and informed                         consent was obtained.                        - Patient identification and proposed procedure were                         verified prior to the procedure by the nurse. The                         procedure was verified in the procedure room.                        - ASA Grade Assessment: III - A patient with severe                         systemic disease.                        - After reviewing the risks and benefits, the patient                         was deemed in satisfactory condition to undergo the                         procedure.                        After obtaining informed consent, the endoscope was                         passed under direct vision. Throughout the procedure,  the patient's blood pressure, pulse, and oxygen                         saturations were monitored continuously. The Endoscope                         was introduced through the mouth, and advanced to the                         third part of duodenum. The upper GI endoscopy was                          accomplished without difficulty. The patient tolerated                         the procedure well. Findings:      One benign-appearing, intrinsic mild stenosis was found at the       gastroesophageal junction. This stenosis measured 1.5 cm (inner       diameter) x less than one cm (in length). The stenosis was traversed.       The scope was withdrawn. Dilation was performed with a Maloney dilator       with no resistance at 54 Fr.      The exam of the esophagus was otherwise normal.      A 2 cm hiatal hernia was present.      The exam of the stomach was otherwise normal.      The examined duodenum was normal. Biopsies for histology were taken with       a cold forceps for evaluation of celiac disease. Estimated blood loss       was minimal. Impression:            - Benign-appearing esophageal stenosis. Dilated.                        - 2 cm hiatal hernia.                        - Normal examined duodenum. Biopsied. Recommendation:        - Await pathology results from EGD, also performed                         today.                        - Monitor results to esophageal dilation                        - Proceed with colonoscopy Procedure Code(s):     --- Professional ---                        [phone], Esophagogastroduodenoscopy, flexible,                         transoral; with biopsy, single or multiple                        [id number], Dilation of esophagus, by unguided sound or  bougie, single or multiple passes Diagnosis Code(s):     --- Professional ---                        R13.10, Dysphagia, unspecified                        D50.9, Iron  deficiency anemia, unspecified                        K44.9, Diaphragmatic hernia without obstruction or                         gangrene                        K22.2, Esophageal obstruction CPT copyright 2022 American Medical Association. All rights reserved. The codes documented in this report  are preliminary and upon coder review may  be revised to meet current compliance requirements. Cassie Click MD, MD 12/17/2023 8:47:21 AM This report has been signed electronically. Number of Addenda: 0 Note Initiated On: 12/17/2023 8:32 AM Estimated Blood Loss:  Estimated blood loss was minimal.      [facility]

## 2023-12-17 NOTE — H&P (Signed)
 Outpatient short stay form Pre-procedure 12/17/2023 8:20 AM Jared Cline K. Corky Diener, M.D.  Primary Physician: Donny Gall, FNP  Reason for visit:  Iron  deficiency anemia, esophageal dysphagia  History of present illness:  [name] presents to the [facility] GI clinic at the request of Dr. Hedda Liv for chief complaint of iron -deficiency anemia. He was referred to our clinic back in October 2024 by Dr. Valentine Gasmen at the [facility] to discuss GI work-up of iron -deficiency anemia. Labs in Sept 2024 showed hemoglobin 8.9, MCV 83, ferritin 8, serum iron  57, TIBC 413, and iron  sat 14%. He was also experiencing fatigue and SOBOE at this time and underwent IV iron  infusions x4 last year. Baseline hemoglobin is between 9-10. Repeat blood work 2 weeks ago showed hemoglobin 11.6 and MCV 88. He denies any overt hematochezia or melena. No obvious blood loss. He does deal with chronic constipation and reports he has to take a laxative in order to use the restroom. If he didn't take a OTC laxative he reports it would be close to 7 days in between bowel movement. Since he has been taking the OTC laxative he is going several times a day. He denies any lower abdominal pain. He has noticed postprandial distress with abdominal bloating and upper abdominal discomfort after his evening meal. He has been taking Gas-X OTC for gas and this has provided minimal relief. He denies any frequent heartburn or acid reflux symptoms. He has noticed 96-month history of solid food esophageal dysphagia symptoms localized to upper and middle sternum. There is no food regurgitation of food boluses. He will usually drink some water and the food bolus will go down his esophagus slowly and he can feel this. No issues initiating a swallow. He denies any liquid or pill dysphagia. He denies any nausea, vomiting, odynophagia, early satiety, hoarseness, or loss of appetite. Weight has been stable.     Current Facility-Administered Medications:     0.9 %  sodium chloride  infusion, , Intravenous, Continuous, [place], Shynia Daleo K, MD, Last Rate: 20 mL/hr at 12/17/23 0807, Continued from Pre-op at 12/17/23 0807  Medications Prior to Admission  Medication Sig Dispense Refill Last Dose/Taking   acetaminophen  (TYLENOL ) 650 MG CR tablet Take 2 tablets (1,300 mg total) by mouth every 12 (twelve) hours as needed (Mild pain).   12/16/2023   aspirin  EC 81 MG tablet Take 1 tablet (81 mg total) by mouth daily at 6 (six) AM. Swallow whole. 30 tablet 11 12/16/2023   cloNIDine  (CATAPRES ) 0.2 MG tablet TAKE 1 TABLET(0.2 MG) BY MOUTH TWICE DAILY 180 tablet 0 12/17/2023 at  6:45 AM   clopidogrel  (PLAVIX ) 75 MG tablet TAKE 1 TABLET(75 MG) BY MOUTH DAILY 30 tablet 11 Past Week   cyanocobalamin  (VITAMIN B12) 1000 MCG tablet Take 1,000 mcg by mouth daily.   Past Week   diclofenac  Sodium (VOLTAREN ) 1 % GEL Apply 2 g topically 4 (four) times daily. 400 g 5 12/16/2023   hydrALAZINE  (APRESOLINE ) 10 MG tablet TAKE 1 TABLET BY MOUTH TWICE DAILY AS NEEDED, TAKE IF BLOOD PRESSURE GREATER THEN 140/90 180 tablet 0 12/17/2023 at  6:45 AM   labetalol  (NORMODYNE ) 100 MG tablet 100 mg 2 (two) times daily.   12/17/2023 at  6:45 AM   linaclotide  (LINZESS ) 145 MCG CAPS capsule Take 1 capsule (145 mcg total) by mouth daily. 30 capsule 0 Past Week   magnesium oxide (MAG-OX) 400 MG tablet 1 tablet 500mg  Orally Once a day   Past Week   pravastatin  (PRAVACHOL ) 80 MG  tablet TAKE 1 TABLET(80 MG) BY MOUTH AT BEDTIME 90 tablet 2 12/16/2023   torsemide  (DEMADEX ) 20 MG tablet TAKE 1 TABLET(20 MG) BY MOUTH TWICE DAILY 180 tablet 0 12/16/2023   valsartan  (DIOVAN ) 160 MG tablet Take 1 tablet (160 mg total) by mouth daily.   12/17/2023 at  6:45 AM   pregabalin  (LYRICA ) 25 MG capsule Take 1 capsule (25 mg total) by mouth 2 (two) times daily. (Patient not taking: Reported on 12/12/2023) 60 capsule 0      Allergies  Allergen Reactions   Flomax [Tamsulosin Hcl] Shortness Of Breath   Lisinopril     Other  Reaction(s): angioedema   Ace Inhibitors Other (See Comments)    Angioedema   Gabapentin  Other (See Comments)    Dizzy headache   Lipitor [Atorvastatin ] Other (See Comments)    Memory loss   Amlodipine Other (See Comments) and Rash    Angioedema  amlodipine   Doxazosin Rash    doxazosin     Past Medical History:  Diagnosis Date   Anemia in chronic kidney disease    BPH (benign prostatic hypertrophy)    Bradycardia    evaluated by Dr. Bary Likes   Cancer [facility])    prostate   Carpal tunnel syndrome, left    left arm   CKD (chronic kidney disease) stage 2, GFR 60-89 ml/min    Diverticulitis    Gastritis 2017   History of prostate cancer    HNP (herniated nucleus pulposus), lumbar    Hypercalcemia    Hyperlipidemia    Hypertension    IBS (irritable bowel syndrome)    Impotence    Multiple lung nodules    nonspecific, largest 5mm in Jan 2016; scanned March 2017; rescan 3-6 months later   OA (osteoarthritis) of knee    right   Personal history of prostate cancer    Pulmonary embolism (HCC)    Renal mass    evaluated; followed by urologist   Sleep apnea    on CPAP   Stroke (HCC)    Torn meniscus    x 2; right knee    Review of systems:  Otherwise negative.    Physical Exam  Gen: Alert, oriented. Appears stated age.  HEENT: [place]/AT. PERRLA. Lungs: CTA, no wheezes. CV: RR nl S1, S2. Abd: soft, benign, no masses. BS+ Ext: No edema. Pulses 2+    Planned procedures: Proceed with EGD and colonoscopy. The patient understands the nature of the planned procedure, indications, risks, alternatives and potential complications including but not limited to bleeding, infection, perforation, damage to internal organs and possible oversedation/side effects from anesthesia. The patient agrees and gives consent to proceed.  Please refer to procedure notes for findings, recommendations and patient disposition/instructions.     Jared Cline K. Corky Diener,  M.D. Gastroenterology 12/17/2023  8:20 AM

## 2023-12-18 ENCOUNTER — Encounter: Payer: Self-pay | Admitting: Internal Medicine

## 2023-12-18 LAB — SURGICAL PATHOLOGY

## 2023-12-19 NOTE — Telephone Encounter (Signed)
 Dx would be knee pain and arthritis? I thought these were used for the back?

## 2023-12-22 NOTE — Telephone Encounter (Signed)
 Ordered through [employer]

## 2023-12-23 ENCOUNTER — Other Ambulatory Visit: Payer: Self-pay

## 2023-12-23 DIAGNOSIS — M170 Bilateral primary osteoarthritis of knee: Secondary | ICD-10-CM

## 2023-12-30 NOTE — Telephone Encounter (Signed)
 Copied from CRM [id number]. Topic: Medical Record Request - Records Request >> Dec 30, 2023 11:21 AM Crispin Dolphin wrote: Reason for CRM: Patient called states Bank/Insurance needs to know what day patient has stroke and needs print out of note from provider from when he was seen by PCP after stroke. Patient will also reach out to hospital to get the information that they need from them. Would like to pick it up if its able to be completed. Thank You

## 2023-12-31 DIAGNOSIS — Z9181 History of falling: Secondary | ICD-10-CM | POA: Diagnosis not present

## 2023-12-31 DIAGNOSIS — I10 Essential (primary) hypertension: Secondary | ICD-10-CM | POA: Diagnosis not present

## 2023-12-31 DIAGNOSIS — Z8546 Personal history of malignant neoplasm of prostate: Secondary | ICD-10-CM | POA: Diagnosis not present

## 2023-12-31 DIAGNOSIS — I2510 Atherosclerotic heart disease of native coronary artery without angina pectoris: Secondary | ICD-10-CM | POA: Diagnosis not present

## 2024-01-07 DIAGNOSIS — N189 Chronic kidney disease, unspecified: Secondary | ICD-10-CM | POA: Diagnosis not present

## 2024-01-07 DIAGNOSIS — I739 Peripheral vascular disease, unspecified: Secondary | ICD-10-CM | POA: Diagnosis not present

## 2024-01-10 ENCOUNTER — Other Ambulatory Visit: Payer: Self-pay | Admitting: Nurse Practitioner

## 2024-01-10 DIAGNOSIS — E782 Mixed hyperlipidemia: Secondary | ICD-10-CM

## 2024-01-10 DIAGNOSIS — I10 Essential (primary) hypertension: Secondary | ICD-10-CM

## 2024-01-14 NOTE — Telephone Encounter (Signed)
 Requested Prescriptions  Pending Prescriptions Disp Refills   pravastatin  (PRAVACHOL ) 80 MG tablet [Pharmacy Med Name: PRAVASTATIN  80MG  TABLETS] 90 tablet 2    Sig: TAKE 1 TABLET(80 MG) BY MOUTH AT BEDTIME     Cardiovascular:  Antilipid - Statins Failed - 01/14/2024  8:10 AM      Failed - Lipid Panel in normal range within the last 12 months    Cholesterol, Total  Date Value Ref Range Status  10/19/2015 174 100 - 199 mg/dL Final   Cholesterol  Date Value Ref Range Status  09/23/2023 147 <200 mg/dL Final   LDL Cholesterol (Calc)  Date Value Ref Range Status  09/23/2023 80 mg/dL (calc) Final    Comment:    Reference range: <100 . Desirable range <100 mg/dL for primary prevention;   <70 mg/dL for patients with CHD or diabetic patients  with > or = 2 CHD risk factors. Aaron Aas LDL-C is now calculated using the Martin-Hopkins  calculation, which is a validated novel method providing  better accuracy than the Friedewald equation in the  estimation of LDL-C.  Melinda Sprawls et al. Erroll Heard. 4782;956(21): 2061-2068  (http://education.[employer].com/faq/FAQ164)    HDL  Date Value Ref Range Status  09/23/2023 49 > OR = 40 mg/dL Final  30/86/5784 48 >69 mg/dL Final   Triglycerides  Date Value Ref Range Status  09/23/2023 97 <150 mg/dL Final         Passed - Patient is not pregnant      Passed - Valid encounter within last 12 months    Recent Outpatient Visits           1 month ago Generalized abdominal pain   [facility] Quinton Buckler, FNP   1 month ago Neuropathy   [facility] Quinton Buckler, FNP   2 months ago Generalized abdominal pain   [facility] Donny Gall F, FNP   3 months ago Chronic diastolic congestive heart failure [facility])   [facility] Quinton Buckler, FNP       Future Appointments             In 2 months Abram Hoguet, Monalisa Angles, FNP [facility]   In 9 months Estanislao Heimlich, Dennard Fisher, MD [facility] [place]             cloNIDine  (CATAPRES ) 0.2 MG tablet [Pharmacy Med Name: CLONIDINE  0.2MG  TABLETS] 180 tablet 0    Sig: TAKE 1 TABLET(0.2 MG) BY MOUTH TWICE DAILY     Cardiovascular:  Alpha-2 Agonists Passed - 01/14/2024  8:10 AM      Passed - Last BP in normal range    BP Readings from Last 1 Encounters:  12/17/23 127/71         Passed - Last Heart Rate in normal range    Pulse Readings from Last 1 Encounters:  12/17/23 69         Passed - Valid encounter within last 6 months    Recent Outpatient Visits           1 month ago Generalized abdominal pain   [facility] Quinton Buckler, FNP   1 month ago Neuropathy   [facility] Quinton Buckler, FNP   2 months ago Generalized abdominal pain   [facility] Donny Gall F, FNP   3 months ago Chronic diastolic congestive heart failure (  [facility])   [facility] Quinton Buckler, FNP       Future Appointments             In 2 months Abram Hoguet, Monalisa Angles, FNP [facility]   In 9 months Estanislao Heimlich, Dennard Fisher, MD [facility] [place]

## 2024-01-22 ENCOUNTER — Other Ambulatory Visit: Payer: Self-pay | Admitting: Nurse Practitioner

## 2024-01-22 DIAGNOSIS — R600 Localized edema: Secondary | ICD-10-CM

## 2024-01-23 NOTE — Telephone Encounter (Signed)
 Requested Prescriptions  Pending Prescriptions Disp Refills   torsemide  (DEMADEX ) 20 MG tablet [Pharmacy Med Name: TORSEMIDE  20MG  TABLETS] 180 tablet 0    Sig: TAKE 1 TABLET(20 MG) BY MOUTH TWICE DAILY     Cardiovascular:  Diuretics - Loop Failed - 01/23/2024 11:53 AM      Failed - Cr in normal range and within 180 days    Creat  Date Value Ref Range Status  12/12/2023 1.47 (H) 0.70 - 1.28 mg/dL Final   Creatinine, Urine  Date Value Ref Range Status  05/02/2021 128 20 - 320 mg/dL Final         Failed - Mg Level in normal range and within 180 days    Magnesium  Date Value Ref Range Status  08/28/2022 2.2 1.7 - 2.4 mg/dL Final    Comment:    Performed at [facility], [address] [place], [place] [address]         Passed - K in normal range and within 180 days    Potassium  Date Value Ref Range Status  12/12/2023 4.5 3.5 - 5.3 mmol/L Final  08/25/2014 4.1 3.5 - 5.1 mmol/L Final         Passed - Ca in normal range and within 180 days    Calcium   Date Value Ref Range Status  12/12/2023 10.0 8.6 - 10.3 mg/dL Final   Calcium , Total  Date Value Ref Range Status  08/25/2014 9.1 8.5 - 10.1 mg/dL Final         Passed - Na in normal range and within 180 days    Sodium  Date Value Ref Range Status  12/12/2023 135 135 - 146 mmol/L Final  10/19/2015 135 134 - 144 mmol/L Final  08/25/2014 133 (L) 136 - 145 mmol/L Final         Passed - Cl in normal range and within 180 days    Chloride  Date Value Ref Range Status  12/12/2023 102 98 - 110 mmol/L Final  08/25/2014 100 98 - 107 mmol/L Final         Passed - Last BP in normal range    BP Readings from Last 1 Encounters:  12/17/23 127/71         Passed - Valid encounter within last 6 months    Recent Outpatient Visits           1 month ago Generalized abdominal pain   [facility] Quinton Buckler, FNP   2 months ago Neuropathy   [facility] Quinton Buckler, FNP   2 months ago Generalized abdominal pain   [facility] Quinton Buckler, FNP   4 months ago Chronic diastolic congestive heart failure [facility])   [facility] Quinton Buckler, FNP       Future Appointments             In 1 month Abram Hoguet, Monalisa Angles, FNP [facility]   In 9 months Estanislao Heimlich, Dennard Fisher, MD [facility] [place]

## 2024-01-27 DIAGNOSIS — M170 Bilateral primary osteoarthritis of knee: Secondary | ICD-10-CM | POA: Diagnosis not present

## 2024-02-02 DIAGNOSIS — R1084 Generalized abdominal pain: Secondary | ICD-10-CM | POA: Diagnosis not present

## 2024-02-02 DIAGNOSIS — D500 Iron deficiency anemia secondary to blood loss (chronic): Secondary | ICD-10-CM | POA: Diagnosis not present

## 2024-02-02 DIAGNOSIS — Z9889 Other specified postprocedural states: Secondary | ICD-10-CM | POA: Diagnosis not present

## 2024-02-02 DIAGNOSIS — K5909 Other constipation: Secondary | ICD-10-CM | POA: Diagnosis not present

## 2024-02-10 DIAGNOSIS — I129 Hypertensive chronic kidney disease with stage 1 through stage 4 chronic kidney disease, or unspecified chronic kidney disease: Secondary | ICD-10-CM | POA: Diagnosis not present

## 2024-02-10 DIAGNOSIS — R1084 Generalized abdominal pain: Secondary | ICD-10-CM | POA: Diagnosis not present

## 2024-02-10 DIAGNOSIS — N1831 Chronic kidney disease, stage 3a: Secondary | ICD-10-CM | POA: Diagnosis not present

## 2024-02-19 DIAGNOSIS — D500 Iron deficiency anemia secondary to blood loss (chronic): Secondary | ICD-10-CM | POA: Diagnosis not present

## 2024-03-08 ENCOUNTER — Other Ambulatory Visit: Payer: Self-pay | Admitting: Nurse Practitioner

## 2024-03-08 DIAGNOSIS — I10 Essential (primary) hypertension: Secondary | ICD-10-CM

## 2024-03-10 DIAGNOSIS — R140 Abdominal distension (gaseous): Secondary | ICD-10-CM | POA: Diagnosis not present

## 2024-03-10 DIAGNOSIS — K5900 Constipation, unspecified: Secondary | ICD-10-CM | POA: Diagnosis not present

## 2024-03-10 NOTE — Telephone Encounter (Signed)
 Requested Prescriptions  Pending Prescriptions Disp Refills   hydrALAZINE  (APRESOLINE ) 10 MG tablet [Pharmacy Med Name: HYDRALAZINE  10 MG TABLETS (ORANGE)] 180 tablet 0    Sig: TAKE 1 TABLET BY MOUTH TWICE DAILY AS NEEDED, TAKE IF BLOOD PRESSURE GREATER THAN 140/90     Cardiovascular:  Vasodilators Failed - 03/10/2024 12:31 PM      Failed - HCT in normal range and within 360 days    HCT  Date Value Ref Range Status  12/12/2023 35.8 (L) 38.5 - 50.0 % Final   Hematocrit  Date Value Ref Range Status  10/19/2015 37.6 37.5 - 51.0 % Final         Failed - HGB in normal range and within 360 days    Hemoglobin  Date Value Ref Range Status  12/12/2023 11.5 (L) 13.2 - 17.1 g/dL Final  96/78/7974 87.6 (L) 13.0 - 17.0 g/dL Final  96/97/7982 87.6 (L) 12.6 - 17.7 g/dL Final         Failed - RBC in normal range and within 360 days    RBC  Date Value Ref Range Status  12/12/2023 4.06 (L) 4.20 - 5.80 Million/uL Final         Failed - ANA Screen, Ifa, Serum in normal range and within 360 days    No results found for: ANA, ANATITER, LABANTI       Passed - WBC in normal range and within 360 days    WBC  Date Value Ref Range Status  12/12/2023 6.3 3.8 - 10.8 Thousand/uL Final         Passed - PLT in normal range and within 360 days    Platelets  Date Value Ref Range Status  12/12/2023 346 140 - 400 Thousand/uL Final  10/19/2015 343 150 - 379 x10E3/uL Final   Platelet Count  Date Value Ref Range Status  11/07/2023 332 150 - 400 K/uL Final         Passed - Last BP in normal range    BP Readings from Last 1 Encounters:  12/17/23 127/71         Passed - Valid encounter within last 12 months    Recent Outpatient Visits           2 months ago Generalized abdominal pain   [facility] Gareth [name] FALCON, FNP   3 months ago Neuropathy   [facility] Gareth [name] FALCON, FNP   3 months ago Generalized abdominal pain   [facility] Gareth [name] FALCON, FNP   5 months ago Chronic diastolic congestive heart failure [facility])   [facility] Gareth [name] FALCON, FNP       Future Appointments             In 1 week Gareth, [name] FALCON, FNP [facility]   In 7 months Francisca, Redell BROCKS, MD [facility] [place]

## 2024-03-12 DIAGNOSIS — R109 Unspecified abdominal pain: Secondary | ICD-10-CM | POA: Diagnosis not present

## 2024-03-12 DIAGNOSIS — I10 Essential (primary) hypertension: Secondary | ICD-10-CM | POA: Diagnosis not present

## 2024-03-12 DIAGNOSIS — E782 Mixed hyperlipidemia: Secondary | ICD-10-CM | POA: Diagnosis not present

## 2024-03-22 ENCOUNTER — Ambulatory Visit: Payer: Medicare Other | Admitting: Nurse Practitioner

## 2024-03-22 ENCOUNTER — Encounter: Payer: Self-pay | Admitting: Nurse Practitioner

## 2024-03-22 VITALS — BP 124/70 | HR 64 | Resp 16 | Ht 72.0 in | Wt 219.1 lb

## 2024-03-22 DIAGNOSIS — Z8673 Personal history of transient ischemic attack (TIA), and cerebral infarction without residual deficits: Secondary | ICD-10-CM

## 2024-03-22 DIAGNOSIS — N182 Chronic kidney disease, stage 2 (mild): Secondary | ICD-10-CM

## 2024-03-22 DIAGNOSIS — G629 Polyneuropathy, unspecified: Secondary | ICD-10-CM

## 2024-03-22 DIAGNOSIS — M1990 Unspecified osteoarthritis, unspecified site: Secondary | ICD-10-CM

## 2024-03-22 DIAGNOSIS — E782 Mixed hyperlipidemia: Secondary | ICD-10-CM | POA: Diagnosis not present

## 2024-03-22 DIAGNOSIS — Z9079 Acquired absence of other genital organ(s): Secondary | ICD-10-CM

## 2024-03-22 DIAGNOSIS — I700 Atherosclerosis of aorta: Secondary | ICD-10-CM | POA: Diagnosis not present

## 2024-03-22 DIAGNOSIS — I6521 Occlusion and stenosis of right carotid artery: Secondary | ICD-10-CM

## 2024-03-22 DIAGNOSIS — I10 Essential (primary) hypertension: Secondary | ICD-10-CM | POA: Diagnosis not present

## 2024-03-22 DIAGNOSIS — I739 Peripheral vascular disease, unspecified: Secondary | ICD-10-CM

## 2024-03-22 DIAGNOSIS — R7309 Other abnormal glucose: Secondary | ICD-10-CM | POA: Diagnosis not present

## 2024-03-22 DIAGNOSIS — Z8546 Personal history of malignant neoplasm of prostate: Secondary | ICD-10-CM

## 2024-03-22 DIAGNOSIS — N1831 Chronic kidney disease, stage 3a: Secondary | ICD-10-CM

## 2024-03-22 DIAGNOSIS — I25119 Atherosclerotic heart disease of native coronary artery with unspecified angina pectoris: Secondary | ICD-10-CM

## 2024-03-22 DIAGNOSIS — I70213 Atherosclerosis of native arteries of extremities with intermittent claudication, bilateral legs: Secondary | ICD-10-CM

## 2024-03-22 DIAGNOSIS — G4733 Obstructive sleep apnea (adult) (pediatric): Secondary | ICD-10-CM

## 2024-03-22 DIAGNOSIS — I5032 Chronic diastolic (congestive) heart failure: Secondary | ICD-10-CM

## 2024-03-22 DIAGNOSIS — I119 Hypertensive heart disease without heart failure: Secondary | ICD-10-CM

## 2024-03-22 DIAGNOSIS — R600 Localized edema: Secondary | ICD-10-CM

## 2024-03-22 NOTE — Progress Notes (Signed)
 BP 124/70   Pulse 64   Resp 16   Ht 6' (1.829 m)   Wt 219 lb 1.6 oz (99.4 kg)   SpO2 99%   BMI 29.72 kg/m    Subjective:    Patient ID: Jared Cline, male    DOB: 12-01-1945, 78 y.o.   MRN: [id number]  HPI: Jared Cline is a 78 y.o. male  Chief Complaint  Patient presents with   Medical Management of Chronic Issues    Discussed the use of AI scribe software for clinical note transcription with the patient, who gave verbal consent to proceed.  History of Present Illness Jared Cline is a 78 year old male with hypertension, coronary artery disease, and heart failure who presents for a six-month follow-up.  Cardiovascular symptoms and blood pressure control - Hypertension, coronary artery disease, atherosclerosis, left ventricular hypertrophy, peripheral artery disease, heart failure, and carotid artery stenosis - Current antihypertensive regimen includes clonidine  0.2 mg twice daily, hydralazine  10 mg twice daily as needed for blood pressure greater than 140/90, labetalol  100 mg twice daily, and torsemide  20 mg twice daily - Aspirin  81 mg daily and Plavix  75 mg daily for antiplatelet therapy - No chest pain, shortness of breath, or palpitations  Peripheral edema - Lower extremity edema present - Edema improves with leg elevation - Swelling worsens after prolonged standing, such as after a long day at church - Edema is generally manageable  Cerebrovascular and thromboembolic history - History of cerebrovascular accident and pulmonary embolism - No new neurological deficits or symptoms reported  Sleep apnea - Obstructive sleep apnea managed with CPAP therapy  Neuropathy and musculoskeletal symptoms - History of neuropathy and osteoarthritis - doing well  Gastrointestinal symptoms - Chronic constipation under gastroenterology care - Recent breath test performed, results pending  Metabolic and renal findings - Chronic kidney disease and hyperlipidemia - History of  elevated glucose levels on August comprehensive metabolic panel  Oncologic history - History of prostate cancer, currently without active symptoms  Obesity and weight loss - Current weight 219 pounds, BMI 29.72 - Recent weight loss from 238 pounds - Decrease in waist size by one inch - Pleased with weight loss progress     Waist Measurement : 41.5 inches     03/22/2024   10:21 AM 07/03/2023    9:04 AM 04/30/2023    2:16 PM  Depression screen PHQ 2/9  Decreased Interest 0 0 0  Down, Depressed, Hopeless 0 0 0  PHQ - 2 Score 0 0 0  Altered sleeping  0 0  Tired, decreased energy  0 0  Change in appetite  0 0  Feeling bad or failure about yourself   0 0  Trouble concentrating  0 0  Moving slowly or fidgety/restless  0 0  Suicidal thoughts  0 0  PHQ-9 Score  0 0  Difficult doing work/chores  Not difficult at all     Relevant past medical, surgical, family and social history reviewed and updated as indicated. Interim medical history since our last visit reviewed. Allergies and medications reviewed and updated.  Review of Systems  Constitutional: Negative for fever or weight change.  Respiratory: Negative for cough and shortness of breath.   Cardiovascular: Negative for chest pain or palpitations.  Gastrointestinal: Negative for abdominal pain, no bowel changes.  Musculoskeletal: Negative for gait problem or joint swelling.  Skin: Negative for rash.  Neurological: Negative for dizziness or headache.  No other specific complaints in a complete review of systems (except  as listed in HPI above).      Objective:     BP 124/70   Pulse 64   Resp 16   Ht 6' (1.829 m)   Wt 219 lb 1.6 oz (99.4 kg)   SpO2 99%   BMI 29.72 kg/m    Wt Readings from Last 3 Encounters:  03/22/24 219 lb 1.6 oz (99.4 kg)  12/17/23 224 lb 3.2 oz (101.7 kg)  12/12/23 224 lb 4.8 oz (101.7 kg)    Physical Exam Physical Exam VITALS: BP- 124/70 MEASUREMENTS: Weight- 219, BMI- 29.72. GENERAL: Alert,  cooperative, well developed, no acute distress HEENT: Normocephalic, normal oropharynx, moist mucous membranes CHEST: Clear to auscultation bilaterally, no wheezes, rhonchi, or crackles CARDIOVASCULAR: Normal heart rate and rhythm, S1 and S2 normal without murmurs ABDOMEN: Soft, non-tender, non-distended, without organomegaly, normal bowel sounds EXTREMITIES: No cyanosis or edema NEUROLOGICAL: Cranial nerves grossly intact, moves all extremities without gross motor or sensory deficit   Results for orders placed or performed during the hospital encounter of 12/17/23  Surgical pathology   Collection Time: 12/17/23 12:00 AM  Result Value Ref Range   SURGICAL PATHOLOGY      SURGICAL PATHOLOGY [facility] [address], [address] [place], [place] [address] Telephone [phone] or [phone] Fax [phone]  REPORT OF SURGICAL PATHOLOGY   Accession #: [id number] Patient Name: Jared Cline, Jared Cline Visit # : [id number]  MRN: [id number] Physician: Aundria Lombard DOB/Age [birth year] (Age: 19) Gender: M Collected Date: 12/17/2023 Received Date: 12/17/2023  FINAL DIAGNOSIS       1. Duodenum, Biopsy, cbx :       DUODENAL MUCOSA WITH NORMAL VILLOUS ARCHITECTURE.      NO VILLOUS ATROPHY OR INCREASED INTRAEPITHELIAL LYMPHOCYTES.       DATE SIGNED OUT: 12/18/2023 ELECTRONIC SIGNATURE : Belvie Come, John, Pathologist, Electronic Signature  MICROSCOPIC DESCRIPTION  CASE COMMENTS STAINS USED IN DIAGNOSIS: H&E    CLINICAL HISTORY  SPECIMEN(S) OBTAINED 1. Duodenum, Biopsy, Cbx  SPECIMEN COMMENTS: 1. Rule out celiac disease SPECIMEN CLINICAL INFORMATION: 1. Iron  deficiency anemia due to chronic blood loss, esopha geal dysphagia, hiatal hernia, esophageal stricture, internal hemorrhoids    Gross Description 1. CBX duodenum to rule out celiac disease, received in formalin is a 1.5 x 0.3 x 0.1 cm aggregate of 4 pink-tan tissue fragments. The specimen is  submitted in toto in 1 block (1A).      AMG 12/17/2023        Report signed out from the following location(s) [place]. [facility] [address] [place], [place] [address] CLIA #: [id number]  [facility] [address] [place], [place] [address] CLIA #: [id number]           Assessment & Plan:   Problem List Items Addressed This Visit       Cardiovascular and Mediastinum   Hypertension (Chronic)   Relevant Orders   Comprehensive metabolic panel with GFR   CBC with Differential/Platelet   Atherosclerosis of native arteries of extremity with intermittent claudication (HCC)   Relevant Orders   Lipid panel   LVH (left ventricular hypertrophy) due to hypertensive disease, without heart failure   PAD (peripheral artery disease) (HCC)   Coronary artery disease involving native coronary artery of native heart   Relevant Orders   Lipid panel   Aortic atherosclerosis (HCC) - Primary   Relevant Orders   Lipid panel   Carotid artery stenosis, symptomatic, right   Relevant Orders   Lipid  panel   Diastolic CHF (HCC)   Relevant Orders   Lipid panel     Respiratory   OSA on CPAP     Nervous and Auditory   Neuropathy     Musculoskeletal and Integument   Arthritis     Genitourinary   Stage 3a chronic kidney disease (HCC)   Relevant Orders   Comprehensive metabolic panel with GFR   RESOLVED: CKD (chronic kidney disease) stage 2, GFR 60-89 ml/min   Relevant Orders   Comprehensive metabolic panel with GFR     Other   Hyperlipidemia (Chronic)   Relevant Orders   Comprehensive metabolic panel with GFR   Lipid panel   History of prostate cancer   History of radical prostatectomy   History of CVA (cerebrovascular accident)   Relevant Orders   Lipid panel   Lower extremity edema   Relevant Orders   Comprehensive metabolic panel with GFR   Other Visit Diagnoses       Elevated glucose       Relevant Orders   Hemoglobin A1c         Assessment and Plan Assessment & Plan Hypertension Blood pressure is well-controlled at 124/70 mmHg. - Continue current antihypertensive regimen.  Coronary artery disease and atherosclerosis No new symptoms reported. - Continue current medication regimen including aspirin  and Plavix .  Heart failure with left ventricular hypertrophy No new symptoms of heart failure exacerbation reported. - Continue current medication regimen.  Peripheral artery disease No new symptoms reported. - Continue current management plan.  Obstructive sleep apnea No new issues reported. - Continue CPAP use.  Chronic kidney disease No new symptoms reported. - Monitor with blood work.  Hyperlipidemia No new issues reported. - Continue pravastatin  80 mg daily.  Obesity Weight is 219 pounds with a BMI of 29.72. Reports weight loss from previous measurements.  History of lower extremity edema Reports occasional swelling, particularly after prolonged sitting. - Manage with leg elevation.  History of elevated glucose - Monitor with blood work. - Order A1c test.        Follow up plan: Return in about 6 months (around 09/22/2024) for follow up.

## 2024-03-23 ENCOUNTER — Ambulatory Visit: Payer: Self-pay | Admitting: Nurse Practitioner

## 2024-03-23 LAB — COMPREHENSIVE METABOLIC PANEL WITH GFR
AG Ratio: 1.9 (calc) (ref 1.0–2.5)
ALT: 12 U/L (ref 9–46)
AST: 17 U/L (ref 10–35)
Albumin: 4.3 g/dL (ref 3.6–5.1)
Alkaline phosphatase (APISO): 64 U/L (ref 35–144)
BUN/Creatinine Ratio: 12 (calc) (ref 6–22)
BUN: 16 mg/dL (ref 7–25)
CO2: 29 mmol/L (ref 20–32)
Calcium: 9.8 mg/dL (ref 8.6–10.3)
Chloride: 102 mmol/L (ref 98–110)
Creat: 1.35 mg/dL — ABNORMAL HIGH (ref 0.70–1.28)
Globulin: 2.3 g/dL (ref 1.9–3.7)
Glucose, Bld: 107 mg/dL — ABNORMAL HIGH (ref 65–99)
Potassium: 4.8 mmol/L (ref 3.5–5.3)
Sodium: 137 mmol/L (ref 135–146)
Total Bilirubin: 0.3 mg/dL (ref 0.2–1.2)
Total Protein: 6.6 g/dL (ref 6.1–8.1)
eGFR: 53 mL/min/1.73m2 — ABNORMAL LOW (ref >=60)

## 2024-03-23 LAB — CBC WITH DIFFERENTIAL/PLATELET
Absolute Lymphocytes: 1695 {cells}/uL (ref 850–3900)
Absolute Monocytes: 354 {cells}/uL (ref 200–950)
Basophils Absolute: 52 {cells}/uL (ref 0–200)
Basophils Relative: 1 %
Eosinophils Absolute: 109 {cells}/uL (ref 15–500)
Eosinophils Relative: 2.1 %
HCT: 37.6 % — ABNORMAL LOW (ref 38.5–50.0)
Hemoglobin: 12 g/dL — ABNORMAL LOW (ref 13.2–17.1)
MCH: 28.5 pg (ref 27.0–33.0)
MCHC: 31.9 g/dL — ABNORMAL LOW (ref 32.0–36.0)
MCV: 89.3 fL (ref 80.0–100.0)
MPV: 10.1 fL (ref 7.5–12.5)
Monocytes Relative: 6.8 %
Neutro Abs: 2990 {cells}/uL (ref 1500–7800)
Neutrophils Relative %: 57.5 %
Platelets: 306 Thousand/uL (ref 140–400)
RBC: 4.21 Million/uL (ref 4.20–5.80)
RDW: 12 % (ref 11.0–15.0)
Total Lymphocyte: 32.6 %
WBC: 5.2 Thousand/uL (ref 3.8–10.8)

## 2024-03-23 LAB — LIPID PANEL
Cholesterol: 139 mg/dL (ref <200)
HDL: 60 mg/dL (ref >=40)
LDL Cholesterol (Calc): 66 mg/dL
Non-HDL Cholesterol (Calc): 79 mg/dL (ref <130)
Total CHOL/HDL Ratio: 2.3 (calc) (ref <5.0)
Triglycerides: 46 mg/dL (ref <150)

## 2024-03-23 LAB — HEMOGLOBIN A1C
Hgb A1c MFr Bld: 5.8 % — ABNORMAL HIGH (ref <5.7)
Mean Plasma Glucose: 120 mg/dL
eAG (mmol/L): 6.6 mmol/L

## 2024-04-10 ENCOUNTER — Other Ambulatory Visit: Payer: Self-pay | Admitting: Nurse Practitioner

## 2024-04-10 DIAGNOSIS — I10 Essential (primary) hypertension: Secondary | ICD-10-CM

## 2024-04-12 NOTE — Telephone Encounter (Signed)
 Requested Prescriptions  Pending Prescriptions Disp Refills   cloNIDine  (CATAPRES ) 0.2 MG tablet [Pharmacy Med Name: CLONIDINE  0.2MG  TABLETS] 180 tablet 0    Sig: TAKE 1 TABLET(0.2 MG) BY MOUTH TWICE DAILY     Cardiovascular:  Alpha-2 Agonists Passed - 04/12/2024  1:53 PM      Passed - Last BP in normal range    BP Readings from Last 1 Encounters:  03/22/24 124/70         Passed - Last Heart Rate in normal range    Pulse Readings from Last 1 Encounters:  03/22/24 64         Passed - Valid encounter within last 6 months    Recent Outpatient Visits           3 weeks ago Aortic atherosclerosis [facility])   [place] [facility] Gareth [name] FALCON, FNP   4 months ago Generalized abdominal pain   [facility] Gareth [name] FALCON, FNP   4 months ago Neuropathy   [facility] Gareth [name] FALCON, FNP   5 months ago Generalized abdominal pain   [facility] Gareth [name] F, FNP   6 months ago Chronic diastolic congestive heart failure [facility])   [facility] Gareth [name] FALCON, FNP       Future Appointments             In 6 months Francisca, Redell BROCKS, MD [facility] [place]

## 2024-04-15 ENCOUNTER — Encounter: Payer: Self-pay | Admitting: Urology

## 2024-04-28 DIAGNOSIS — M170 Bilateral primary osteoarthritis of knee: Secondary | ICD-10-CM | POA: Diagnosis not present

## 2024-05-07 ENCOUNTER — Inpatient Hospital Stay

## 2024-05-07 ENCOUNTER — Inpatient Hospital Stay: Attending: Internal Medicine

## 2024-05-07 ENCOUNTER — Inpatient Hospital Stay (HOSPITAL_BASED_OUTPATIENT_CLINIC_OR_DEPARTMENT_OTHER): Admitting: Internal Medicine

## 2024-05-07 ENCOUNTER — Encounter: Payer: Self-pay | Admitting: Internal Medicine

## 2024-05-07 VITALS — BP 142/54 | HR 65 | Temp 97.2°F | Resp 16 | Wt 217.5 lb

## 2024-05-07 DIAGNOSIS — N281 Cyst of kidney, acquired: Secondary | ICD-10-CM | POA: Diagnosis not present

## 2024-05-07 DIAGNOSIS — D631 Anemia in chronic kidney disease: Secondary | ICD-10-CM | POA: Insufficient documentation

## 2024-05-07 DIAGNOSIS — D649 Anemia, unspecified: Secondary | ICD-10-CM

## 2024-05-07 DIAGNOSIS — D509 Iron deficiency anemia, unspecified: Secondary | ICD-10-CM | POA: Diagnosis not present

## 2024-05-07 DIAGNOSIS — Z8673 Personal history of transient ischemic attack (TIA), and cerebral infarction without residual deficits: Secondary | ICD-10-CM | POA: Diagnosis not present

## 2024-05-07 DIAGNOSIS — N183 Chronic kidney disease, stage 3 unspecified: Secondary | ICD-10-CM | POA: Insufficient documentation

## 2024-05-07 DIAGNOSIS — Z87891 Personal history of nicotine dependence: Secondary | ICD-10-CM | POA: Insufficient documentation

## 2024-05-07 DIAGNOSIS — Z86711 Personal history of pulmonary embolism: Secondary | ICD-10-CM | POA: Diagnosis not present

## 2024-05-07 LAB — CBC WITH DIFFERENTIAL (CANCER CENTER ONLY)
Abs Immature Granulocytes: 0.1 K/uL — ABNORMAL HIGH (ref 0.00–0.07)
Basophils Absolute: 0 K/uL (ref 0.0–0.1)
Basophils Relative: 0 %
Eosinophils Absolute: 0.1 K/uL (ref 0.0–0.5)
Eosinophils Relative: 1 %
HCT: 37 % — ABNORMAL LOW (ref 39.0–52.0)
Hemoglobin: 12 g/dL — ABNORMAL LOW (ref 13.0–17.0)
Immature Granulocytes: 1 %
Lymphocytes Relative: 23 %
Lymphs Abs: 2.1 K/uL (ref 0.7–4.0)
MCH: 28 pg (ref 26.0–34.0)
MCHC: 32.4 g/dL (ref 30.0–36.0)
MCV: 86.2 fL (ref 80.0–100.0)
Monocytes Absolute: 0.8 K/uL (ref 0.1–1.0)
Monocytes Relative: 8 %
Neutro Abs: 6.2 K/uL (ref 1.7–7.7)
Neutrophils Relative %: 67 %
Platelet Count: 371 K/uL (ref 150–400)
RBC: 4.29 MIL/uL (ref 4.22–5.81)
RDW: 12.4 % (ref 11.5–15.5)
WBC Count: 9.4 K/uL (ref 4.0–10.5)
nRBC: 0 % (ref 0.0–0.2)

## 2024-05-07 LAB — BASIC METABOLIC PANEL - CANCER CENTER ONLY
Anion gap: 7 (ref 5–15)
BUN: 20 mg/dL (ref 8–23)
CO2: 23 mmol/L (ref 22–32)
Calcium: 9.3 mg/dL (ref 8.9–10.3)
Chloride: 104 mmol/L (ref 98–111)
Creatinine: 1.55 mg/dL — ABNORMAL HIGH (ref 0.61–1.24)
GFR, Estimated: 45 mL/min — ABNORMAL LOW (ref >60)
Glucose, Bld: 96 mg/dL (ref 70–99)
Potassium: 4.7 mmol/L (ref 3.5–5.1)
Sodium: 134 mmol/L — ABNORMAL LOW (ref 135–145)

## 2024-05-07 LAB — IRON AND TIBC
Iron: 49 ug/dL (ref 45–182)
Saturation Ratios: 14 % — ABNORMAL LOW (ref 17.9–39.5)
TIBC: 346 ug/dL (ref 250–450)
UIBC: 297 ug/dL

## 2024-05-07 LAB — FERRITIN: Ferritin: 59 ng/mL (ref 24–336)

## 2024-05-07 NOTE — Assessment & Plan Note (Addendum)
#   Mild anemia Hb 9-10- ? Sec to CKD-/IDA- Recommend iron  biglycinate; 28 mg- ? constipation]. HOLD IV venofer - s/p [facility]- EGD- colonoscopy- capsule in April 2025.   #  JAN 7th, 2024-Unprovoked- Acute pulmonary embolus within segmental branch of the right upper lobe pulmonary artery and segmental branch of the left lower lobe pulmonary artery.  CTA SEP 2024- NO acute PE. S/p Eliquis    STOPPED eliquis - in DEC 2024.   # Cerebellar stroke- JAN 2024 [facility]; Dr.Shah- AUG 2023]- S/p right carotid stenting- [april 2024; Dr.Schneir]; Severe, bilateral basilar artery occlusion, occluded left vertebral artery, moderate right vertebral artery stenosis-[ Dr Zomorodi at [facility] neurosurgery [oct 2024]- s/p stenting-   On Asprin & plavix -  stable.   #  CT sca AP- FEB 2025- Bilateral simple and complex renal cysts unchanged since prior study. Stable enhancing lesion lower pole right kidney compatible with renal cell carcinoma as characterized on prior MRI [Dr.Korrapati]; MRI- 2022- Brightly enhancing mass arising from the inferior pole of the right kidney measuring 1.4 x 1.4 cm- follow up Nephrology.   # CKD [Dr.Kolluru]- III - recommend avoiding nephrotoxic NSAIDs-Advil/Motrin- Stable.   # DISPOSITION: #  HOLD venofer  today. # follow up in 6  month- MD; labs- cbc/bmp; iron  studies, ferritin- possible venofer - Dr.B

## 2024-05-07 NOTE — Progress Notes (Signed)
Pt in for follow up, denies any difficulties or concerns today.

## 2024-05-07 NOTE — Patient Instructions (Signed)
#  Recommend gentle iron  [iron  biglycinate; 28 mg ] 1 pill a day.  This pill is unlikely to cause stomach upset or cause constipation.  Available Over the counter or talk to pharmacist.

## 2024-05-07 NOTE — Progress Notes (Signed)
 Jared Cline CONSULT NOTE  Patient Care Team: Jared [name] FALCON, FNP as PCP - General (Nurse Practitioner) Jared Wolm PARAS, MD as Consulting Physician (Cardiology) Jared Brandy, MD as Consulting Physician (Nephrology) Jared Redell BROCKS, MD as Consulting Physician (Urology) Jared Cindy SAUNDERS, MD as Consulting Physician (Oncology)  CHIEF COMPLAINTS/PURPOSE OF CONSULTATION: DVT/PE  # IMPRESSION: 1. Exam is positive for acute pulmonary embolus within segmental branch of the right upper lobe pulmonary artery and segmental branch of the left lower lobe pulmonary artery. 2. Mild mosaic attenuation of the lungs which may reflect small airways disease. 3. Multiple complicated renal cysts, reference abdominal MRI with follow-up recommendations 07/09/2021. 4. Coronary artery calcifications noted. 5. 5 mm right upper lobe pulmonary nodule is stable from 2019 and is considered benign. 6.  Aortic Atherosclerosis (ICD10-I70.0).     Electronically Signed   By: Jared Cline M.D.   On: 08/25/2022 08:57  # JAN 7th, 2024-Unprovoked- Acute pulmonary embolus within segmental branch of the right upper lobe pulmonary artery and segmental branch of the left lower lobe pulmonary artery. On Eliquis ; CTA SEP 2024- NO acute PE. Eliquis  5 mg BID x 8 m; STOPPED  eliquis - in DEC 2025.    Oncology History   No history exists.   HISTORY OF PRESENTING ILLNESS: Patient ambulating-with assistance- cane.  Alone.   Jared Cline 78 y.o.  male history of poorly controlled blood pressure; history of stroke; status post carotid stenting [on asprin ONLY; dr.Schnier];  chronic kidney disease and Hx of PE [JAN 2024] - unprovoked on eliquis  2.5mg  BID [since SEP 2024]- stopped in DEC 2024  is here for a follow up.   In the interim patient had stents in his neck.  He also had a EGD colonoscopy recently.  Pt in for follow up, denies any difficulties or concerns today. Denies any blood in stools or  black-colored stools. Also shortness of breath on exertion improved.   No falls.  Patient not compliant with oral iron -given history of constipation.  Review of Systems  Constitutional:  Negative for chills, diaphoresis, fever and weight loss.  HENT:  Negative for nosebleeds and sore throat.   Eyes:  Negative for double vision.  Respiratory:  Negative for cough, hemoptysis, sputum production and wheezing.   Cardiovascular:  Negative for chest pain, palpitations, orthopnea and leg swelling.  Gastrointestinal:  Negative for abdominal pain, blood in stool, constipation, diarrhea, heartburn, melena, nausea and vomiting.  Genitourinary:  Negative for dysuria, frequency and urgency.  Musculoskeletal:  Positive for back pain and joint pain.  Skin: Negative.  Negative for itching and rash.  Neurological:  Negative for dizziness, tingling, focal weakness and headaches.  Endo/Heme/Allergies:  Does not bruise/bleed easily.  Psychiatric/Behavioral:  Negative for depression. The patient is not nervous/anxious and does not have insomnia.      MEDICAL HISTORY:  Past Medical History:  Diagnosis Date   Anemia in chronic kidney disease    BPH (benign prostatic hypertrophy)    Bradycardia    evaluated by Dr. Hester   Cancer [facility])    prostate   Carpal tunnel syndrome, left    left arm   CKD (chronic kidney disease) stage 2, GFR 60-89 ml/min    Diverticulitis    Gastritis 2017   History of prostate cancer    HNP (herniated nucleus pulposus), lumbar    Hypercalcemia    Hyperlipidemia    Hypertension    IBS (irritable bowel syndrome)    Impotence    Multiple lung nodules  nonspecific, largest 5mm in Jan 2016; scanned March 2017; rescan 3-6 months later   OA (osteoarthritis) of knee    right   Personal history of prostate cancer    Pulmonary embolism (HCC)    Renal mass    evaluated; followed by urologist   Sleep apnea    on CPAP   Stroke (HCC)    Torn meniscus    x 2; right knee     SURGICAL HISTORY: Past Surgical History:  Procedure Laterality Date   abdominal ultrasound  Oct 2015   Negative for eval for AAA   CAROTID PTA/STENT INTERVENTION Right 11/26/2022   Procedure: CAROTID PTA/STENT INTERVENTION;  Surgeon: Jama Cordella MATSU, MD;  Location: [facility] INVASIVE CV LAB;  Service: Cardiovascular;  Laterality: Right;   COLONOSCOPY     COLONOSCOPY WITH PROPOFOL  N/A 12/17/2023   Procedure: COLONOSCOPY WITH PROPOFOL ;  Surgeon: Toledo, Ladell POUR, MD;  Location: [facility] ENDOSCOPY;  Service: Gastroenterology;  Laterality: N/A;   ESOPHAGOGASTRODUODENOSCOPY (EGD) WITH PROPOFOL  N/A 12/17/2023   Procedure: ESOPHAGOGASTRODUODENOSCOPY (EGD) WITH PROPOFOL ;  Surgeon: Toledo, Ladell POUR, MD;  Location: [facility] ENDOSCOPY;  Service: Gastroenterology;  Laterality: N/A;   KNEE ARTHROSCOPY Right    x 2   LUMBAR LAMINECTOMY     PROSTATECTOMY     robotic    SOCIAL HISTORY: Social History   Socioeconomic History   Marital status: Married    Spouse name: Rishawn Walck   Number of children: 3   Years of education: Not on file   Highest education level: Not on file  Occupational History   Occupation: retired [employer]  Tobacco Use   Smoking status: Former    Current packs/day: 0.00    Average packs/day: 0.5 packs/day for 9.0 years (4.5 ttl pk-yrs)    Types: Cigarettes    Start date: 08/19/1962    Quit date: 08/20/1971    Years since quitting: 52.7    Passive exposure: Past   Smokeless tobacco: Never  Vaping Use   Vaping status: Never Used  Substance and Sexual Activity   Alcohol use: No    Alcohol/week: 0.0 standard drinks of alcohol   Drug use: No   Sexual activity: Not Currently  Other Topics Concern   Not on file  Social History Narrative   Retired from city of [place] 2012.   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    Social Drivers of Corporate investment banker Strain: Low Risk  (07/03/2023)   Overall Financial Resource Strain (CARDIA)     Difficulty of Paying Living Expenses: Not very hard  Food Insecurity: No Food Insecurity (07/03/2023)   Hunger Vital Sign    Worried About Running Out of Food in the Last Year: Never true    Ran Out of Food in the Last Year: Never true  Transportation Needs: No Transportation Needs (07/03/2023)   PRAPARE - Administrator, Civil Service (Medical): No    Lack of Transportation (Non-Medical): No  Physical Activity: Insufficiently Active (07/03/2023)   Exercise Vital Sign    Days of Exercise per Week: 3 days    Minutes of Exercise per Session: 30 min  Stress: No Stress Concern Present (07/03/2023)   [employer] of Occupational Health - Occupational Stress Questionnaire    Feeling of Stress : Only a little  Social Connections: Moderately Integrated (07/03/2023)   Social Connection and Isolation Panel    Frequency of Communication with Friends and Family: Three times a week  Frequency of Social Gatherings with Friends and Family: Twice a week    Attends Religious Services: More than 4 times per year    Active Member of [employer] or Organizations: No    Attends Banker Meetings: Never    Marital Status: Married  Catering manager Violence: Not At Risk (07/03/2023)   Humiliation, Afraid, Rape, and Kick questionnaire    Fear of Current or Ex-Partner: No    Emotionally Abused: No    Physically Abused: No    Sexually Abused: No    FAMILY HISTORY: Family History  Problem Relation Age of Onset   Stroke Mother    Hypertension Mother    Aneurysm Father        cerebral   Hypertension Father    Heart disease Paternal Uncle    Alcohol abuse Brother    Cancer Neg Hx    COPD Neg Hx    Diabetes Neg Hx     ALLERGIES:  is allergic to flomax [tamsulosin hcl], lisinopril, ace inhibitors, gabapentin , lipitor [atorvastatin ], amlodipine, and doxazosin.  MEDICATIONS:  Current Outpatient Medications  Medication Sig Dispense Refill   acetaminophen  (TYLENOL ) 650 MG CR  tablet Take 2 tablets (1,300 mg total) by mouth every 12 (twelve) hours as needed (Mild pain).     aspirin  EC 81 MG tablet Take 1 tablet (81 mg total) by mouth daily at 6 (six) AM. Swallow whole. 30 tablet 11   cloNIDine  (CATAPRES ) 0.2 MG tablet TAKE 1 TABLET(0.2 MG) BY MOUTH TWICE DAILY 180 tablet 0   clopidogrel  (PLAVIX ) 75 MG tablet TAKE 1 TABLET(75 MG) BY MOUTH DAILY 30 tablet 11   cyanocobalamin  (VITAMIN B12) 1000 MCG tablet Take 1,000 mcg by mouth daily.     diclofenac  Sodium (VOLTAREN ) 1 % GEL Apply 2 g topically 4 (four) times daily. 400 g 5   hydrALAZINE  (APRESOLINE ) 10 MG tablet TAKE 1 TABLET BY MOUTH TWICE DAILY AS NEEDED, TAKE IF BLOOD PRESSURE GREATER THAN 140/90 180 tablet 0   hydrochlorothiazide  (HYDRODIURIL ) 12.5 MG tablet      labetalol  (NORMODYNE ) 100 MG tablet 100 mg 2 (two) times daily.     magnesium oxide (MAG-OX) 400 MG tablet 1 tablet 500mg  Orally Once a day     pravastatin  (PRAVACHOL ) 80 MG tablet TAKE 1 TABLET(80 MG) BY MOUTH AT BEDTIME 90 tablet 2   torsemide  (DEMADEX ) 20 MG tablet TAKE 1 TABLET(20 MG) BY MOUTH TWICE DAILY 180 tablet 0   valsartan  (DIOVAN ) 160 MG tablet Take 1 tablet (160 mg total) by mouth daily.     No current facility-administered medications for this visit.       PHYSICAL EXAMINATION:  Vitals:   05/07/24 1329  BP: (!) 142/54  Pulse: 65  Resp: 16  Temp: (!) 97.2 F (36.2 C)  SpO2: 98%   Filed Weights   05/07/24 1329  Weight: 217 lb 8 oz (98.7 kg)    Physical Exam Vitals and nursing note reviewed.  HENT:     Head: Normocephalic and atraumatic.     Mouth/Throat:     Pharynx: Oropharynx is clear.  Eyes:     Extraocular Movements: Extraocular movements intact.     Pupils: Pupils are equal, round, and reactive to light.  Cardiovascular:     Rate and Rhythm: Normal rate and regular rhythm.  Pulmonary:     Comments: Decreased breath sounds bilaterally.  Abdominal:     Palpations: Abdomen is soft.  Musculoskeletal:         General: Normal  range of motion.     Cervical back: Normal range of motion.  Skin:    General: Skin is warm.  Neurological:     General: No focal deficit present.     Mental Status: He is alert and oriented to person, place, and time.  Psychiatric:        Behavior: Behavior normal.        Judgment: Judgment normal.      LABORATORY DATA:  I have reviewed the data as listed Lab Results  Component Value Date   WBC 9.4 05/07/2024   HGB 12.0 (L) 05/07/2024   HCT 37.0 (L) 05/07/2024   MCV 86.2 05/07/2024   PLT 371 05/07/2024   Recent Labs    10/15/23 1115 11/07/23 1306 12/03/23 1943 12/12/23 1547 03/22/24 1043 05/07/24 1301  NA 137 135 133* 135 137 134*  K 4.2 4.2 4.1 4.5 4.8 4.7  CL 103 99 103 102 102 104  CO2 26 27 23 27 29 23   GLUCOSE 110* 88 100* 98 107* 96  BUN 18 31* 20 18 16 20   CREATININE 1.48* 1.73* 1.35* 1.47* 1.35* 1.55*  CALCIUM  9.7 9.7 9.5 10.0 9.8 9.3  GFRNONAA 48* 40* 53*  --   --  45*  PROT 7.4  --  7.3 6.9 6.6  --   ALBUMIN 4.2  --  4.2  --   --   --   AST 22  --  23 17 17   --   ALT 16  --  20 15 12   --   ALKPHOS 54  --  54  --   --   --   BILITOT 0.5  --  0.6 0.4 0.3  --     RADIOGRAPHIC STUDIES: I have personally reviewed the radiological images as listed and agreed with the findings in the report. No results found.   ASSESSMENT & PLAN:   Symptomatic anemia # Mild anemia Hb 9-10- ? Sec to CKD-/IDA- Recommend iron  biglycinate; 28 mg- ? constipation]. HOLD IV venofer - s/p [facility]- EGD- colonoscopy- capsule in April 2025.   #  JAN 7th, 2024-Unprovoked- Acute pulmonary embolus within segmental branch of the right upper lobe pulmonary artery and segmental branch of the left lower lobe pulmonary artery.  CTA SEP 2024- NO acute PE. S/p Eliquis    STOPPED eliquis - in [facility] 2024.   # Cerebellar stroke- JAN 2024 [facility]; Dr.Shah- AUG 2023]- S/p right carotid stenting- [april 2024; Dr.Schneir]; Severe, bilateral basilar artery occlusion, occluded left vertebral  artery, moderate right vertebral artery stenosis-[ Dr Zomorodi at [facility] neurosurgery [oct 2024]- s/p stenting-   On Asprin & plavix -  stable.   #  CT sca AP- FEB 2025- Bilateral simple and complex renal cysts unchanged since prior study. Stable enhancing lesion lower pole right kidney compatible with renal cell carcinoma as characterized on prior MRI [Dr.Korrapati]; MRI- 2022- Brightly enhancing mass arising from the inferior pole of the right kidney measuring 1.4 x 1.4 cm- follow up Nephrology.   # CKD [Dr.Kolluru]- III - recommend avoiding nephrotoxic NSAIDs-Advil/Motrin- Stable.   # DISPOSITION: #  HOLD venofer  today. # follow up in 6  month- MD; labs- cbc/bmp; iron  studies, ferritin- possible venofer - Dr.B   All questions were answered. The patient knows to call the clinic with any problems, questions or concerns.    Cindy JONELLE Joe, MD 05/07/2024 2:03 PM

## 2024-05-25 DIAGNOSIS — H2513 Age-related nuclear cataract, bilateral: Secondary | ICD-10-CM | POA: Diagnosis not present

## 2024-05-25 DIAGNOSIS — H40013 Open angle with borderline findings, low risk, bilateral: Secondary | ICD-10-CM | POA: Diagnosis not present

## 2024-05-25 DIAGNOSIS — H35033 Hypertensive retinopathy, bilateral: Secondary | ICD-10-CM | POA: Diagnosis not present

## 2024-06-18 ENCOUNTER — Encounter: Payer: Self-pay | Admitting: Internal Medicine

## 2024-06-18 NOTE — Telephone Encounter (Signed)
 Encounter opened in error.

## 2024-07-08 ENCOUNTER — Other Ambulatory Visit (INDEPENDENT_AMBULATORY_CARE_PROVIDER_SITE_OTHER): Payer: Self-pay | Admitting: Vascular Surgery

## 2024-07-08 ENCOUNTER — Ambulatory Visit: Payer: Self-pay

## 2024-07-08 DIAGNOSIS — Z0000 Encounter for general adult medical examination without abnormal findings: Secondary | ICD-10-CM

## 2024-07-08 DIAGNOSIS — I6523 Occlusion and stenosis of bilateral carotid arteries: Secondary | ICD-10-CM

## 2024-07-08 NOTE — Progress Notes (Signed)
 No chief complaint on file.    Subjective:   Jared Cline is a 78 y.o. male who presents for a Medicare Annual Wellness Visit.  Allergies (verified) Flomax [tamsulosin hcl], Lisinopril, Ace inhibitors, Gabapentin , Lipitor [atorvastatin ], Amlodipine, and Doxazosin   History: Past Medical History:  Diagnosis Date   Anemia in chronic kidney disease    BPH (benign prostatic hypertrophy)    Bradycardia    evaluated by Dr. Hester   Cancer [facility])    prostate   Carpal tunnel syndrome, left    left arm   CKD (chronic kidney disease) stage 2, GFR 60-89 ml/min    Diverticulitis    Gastritis 2017   History of prostate cancer    HNP (herniated nucleus pulposus), lumbar    Hypercalcemia    Hyperlipidemia    Hypertension    IBS (irritable bowel syndrome)    Impotence    Multiple lung nodules    nonspecific, largest 5mm in Jan 2016; scanned March 2017; rescan 3-6 months later   OA (osteoarthritis) of knee    right   Personal history of prostate cancer    Pulmonary embolism (HCC)    Renal mass    evaluated; followed by urologist   Sleep apnea    on CPAP   Stroke (HCC)    Torn meniscus    x 2; right knee   Past Surgical History:  Procedure Laterality Date   abdominal ultrasound  Oct 2015   Negative for eval for AAA   CAROTID PTA/STENT INTERVENTION Right 11/26/2022   Procedure: CAROTID PTA/STENT INTERVENTION;  Surgeon: Jama Cordella MATSU, MD;  Location: [facility] INVASIVE CV LAB;  Service: Cardiovascular;  Laterality: Right;   COLONOSCOPY     COLONOSCOPY WITH PROPOFOL  N/A 12/17/2023   Procedure: COLONOSCOPY WITH PROPOFOL ;  Surgeon: Toledo, Ladell POUR, MD;  Location: [facility] ENDOSCOPY;  Service: Gastroenterology;  Laterality: N/A;   ESOPHAGOGASTRODUODENOSCOPY (EGD) WITH PROPOFOL  N/A 12/17/2023   Procedure: ESOPHAGOGASTRODUODENOSCOPY (EGD) WITH PROPOFOL ;  Surgeon: Toledo, Ladell POUR, MD;  Location: [facility] ENDOSCOPY;  Service: Gastroenterology;  Laterality: N/A;   KNEE ARTHROSCOPY Right    x 2    LUMBAR LAMINECTOMY     PROSTATECTOMY     robotic   Family History  Problem Relation Age of Onset   Stroke Mother    Hypertension Mother    Aneurysm Father        cerebral   Hypertension Father    Heart disease Paternal Uncle    Alcohol abuse Brother    Cancer Neg Hx    COPD Neg Hx    Diabetes Neg Hx    Social History   Occupational History   Occupation: retired Consulting Civil Engineer  Tobacco Use   Smoking status: Former    Current packs/day: 0.00    Average packs/day: 0.5 packs/day for 9.0 years (4.5 ttl pk-yrs)    Types: Cigarettes    Start date: 08/19/1962    Quit date: 08/20/1971    Years since quitting: 52.9    Passive exposure: Past   Smokeless tobacco: Never  Vaping Use   Vaping status: Never Used  Substance and Sexual Activity   Alcohol use: No    Alcohol/week: 0.0 standard drinks of alcohol   Drug use: No   Sexual activity: Not Currently   Tobacco Counseling Counseling given: Not Answered  SDOH Screenings   Food Insecurity: No Food Insecurity (07/03/2023)  Housing: Unknown (10/07/2023)   [facility]  Transportation Needs: No Transportation Needs (07/03/2023)  Utilities: Not At Risk (07/03/2023)  Alcohol Screen: Low Risk  (07/03/2023)  Depression (PHQ2-9): Low Risk  (05/07/2024)  Financial Resource Strain: Low Risk  (07/03/2023)  Physical Activity: Insufficiently Active (07/03/2023)  Social Connections: Moderately Integrated (07/03/2023)  Stress: No Stress Concern Present (07/03/2023)  Tobacco Use: Medium Risk (05/07/2024)  Health Literacy: Adequate Health Literacy (07/03/2023)   See flowsheets for full screening details  Depression Screen PHQ 2 & 9 Depression Scale- Over the past 2 weeks, how often have you been bothered by any of the following problems? Little interest or pleasure in doing things: 0 Feeling down, depressed, or hopeless (PHQ Adolescent also includes...irritable): 0 PHQ-2 Total Score: 0     Goals  Addressed   None    Fall Screening Falls in the past year?: 0 Number of falls in past year: 0 Was there an injury with Fall?: 0 Fall Risk Category Calculator: 0 Patient Fall Risk Level: High Fall Risk  Fall Risk Patient at Risk for Falls Due to: No Fall Risks Fall risk Follow up: Falls evaluation completed  Advance Directives (For Healthcare) Does Patient Have a Medical Advance Directive?: No Would patient like information on creating a medical advance directive?: No - Patient declined        Objective:    There were no vitals filed for this visit. There is no height or weight on file to calculate BMI.  Current Medications (verified) Outpatient Encounter Medications as of 07/08/2024  Medication Sig   acetaminophen  (TYLENOL ) 650 MG CR tablet Take 2 tablets (1,300 mg total) by mouth every 12 (twelve) hours as needed (Mild pain).   aspirin  EC 81 MG tablet Take 1 tablet (81 mg total) by mouth daily at 6 (six) AM. Swallow whole.   cloNIDine  (CATAPRES ) 0.2 MG tablet TAKE 1 TABLET(0.2 MG) BY MOUTH TWICE DAILY   clopidogrel  (PLAVIX ) 75 MG tablet TAKE 1 TABLET(75 MG) BY MOUTH DAILY   cyanocobalamin  (VITAMIN B12) 1000 MCG tablet Take 1,000 mcg by mouth daily.   diclofenac  Sodium (VOLTAREN ) 1 % GEL Apply 2 g topically 4 (four) times daily.   hydrALAZINE  (APRESOLINE ) 10 MG tablet TAKE 1 TABLET BY MOUTH TWICE DAILY AS NEEDED, TAKE IF BLOOD PRESSURE GREATER THAN 140/90   hydrochlorothiazide  (HYDRODIURIL ) 12.5 MG tablet    labetalol  (NORMODYNE ) 100 MG tablet 100 mg 2 (two) times daily.   magnesium oxide (MAG-OX) 400 MG tablet 1 tablet 500mg  Orally Once a day   pravastatin  (PRAVACHOL ) 80 MG tablet TAKE 1 TABLET(80 MG) BY MOUTH AT BEDTIME   torsemide  (DEMADEX ) 20 MG tablet TAKE 1 TABLET(20 MG) BY MOUTH TWICE DAILY   valsartan  (DIOVAN ) 160 MG tablet Take 1 tablet (160 mg total) by mouth daily.   No facility-administered encounter medications on file as of 07/08/2024.   Hearing/Vision  screen No results found. Immunizations and Health Maintenance Health Maintenance  Topic Date Due   Zoster Vaccines- Shingrix (1 of 2) Never done   Influenza Vaccine  03/19/2024   COVID-19 Vaccine (4 - 2025-26 season) 04/19/2024   Medicare Annual Wellness (AWV)  07/02/2024   Pneumococcal Vaccine: 50+ Years  Completed   Hepatitis C Screening  Completed   Meningococcal B Vaccine  Aged Out   DTaP/Tdap/Td  Discontinued   Colonoscopy  Discontinued        Assessment/Plan:  This is a routine wellness examination for Rayen.  Patient Care Team: Gareth [name] FALCON, FNP as PCP - General (Nurse Practitioner) Hester Wolm PARAS, MD as Consulting Physician (Cardiology) Dominica Brandy, MD as  Consulting Physician (Nephrology) Francisca Redell BROCKS, MD as Consulting Physician (Urology) Rennie Cindy SAUNDERS, MD as Consulting Physician (Oncology)  I have personally reviewed and noted the following in the patient's chart:   Medical and social history Use of alcohol, tobacco or illicit drugs  Current medications and supplements including opioid prescriptions. Functional ability and status Nutritional status Physical activity Advanced directives List of other physicians Hospitalizations, surgeries, and ER visits in previous 12 months Vitals Screenings to include cognitive, depression, and falls Referrals and appointments  No orders of the defined types were placed in this encounter.  In addition, I have reviewed and discussed with patient certain preventive protocols, quality metrics, and best practice recommendations. A written personalized care plan for preventive services as well as general preventive health recommendations were provided to patient.   Jhonnie GORMAN Das, LPN   [id number]   No follow-ups on file.  After Visit Summary: (MyChart) Due to this being a telephonic visit, the after visit summary with patients personalized plan was offered to patient via MyChart   Nurse Notes: NEEDS  FLU, TDAP, SHINGRIX; AGED OUT OF COLONOSCOPY

## 2024-07-08 NOTE — Patient Instructions (Addendum)
 Jared Cline,  Thank you for taking the time for your Medicare Wellness Visit. I appreciate your continued commitment to your health goals. Please review the care plan we discussed, and feel free to reach out if I can assist you further.  Please note that Annual Wellness Visits do not include a physical exam. Some assessments may be limited, especially if the visit was conducted virtually. If needed, we may recommend an in-person follow-up with your provider.  Ongoing Care Seeing your primary care provider every 3 to 6 months helps us  monitor your health and provide consistent, personalized care.   Referrals If a referral was made during today's visit and you haven't received any updates within two weeks, please contact the referred provider directly to check on the status.  Recommended Screenings:  Health Maintenance  Topic Date Due   Zoster (Shingles) Vaccine (1 of 2) Never done   Flu Shot  03/19/2024   COVID-19 Vaccine (4 - 2025-26 season) 04/19/2024   Medicare Annual Wellness Visit  07/08/2025   Pneumococcal Vaccine for age over 12  Completed   Hepatitis C Screening  Completed   Meningitis B Vaccine  Aged Out   DTaP/Tdap/Td vaccine  Discontinued   Colon Cancer Screening  Discontinued     Vision: Annual vision screenings are recommended for early detection of glaucoma, cataracts, and diabetic retinopathy. These exams can also reveal signs of chronic conditions such as diabetes and high blood pressure.  Dental: Annual dental screenings help detect early signs of oral cancer, gum disease, and other conditions linked to overall health, including heart disease and diabetes.  Please see the attached documents for additional preventive care recommendations.   NEXT AWV 07/21/25 @ 8:50 AM IN PERSON

## 2024-07-09 ENCOUNTER — Encounter: Payer: Self-pay | Admitting: Nurse Practitioner

## 2024-07-09 ENCOUNTER — Other Ambulatory Visit: Payer: Self-pay | Admitting: Nurse Practitioner

## 2024-07-09 ENCOUNTER — Ambulatory Visit: Payer: Self-pay | Admitting: *Deleted

## 2024-07-09 ENCOUNTER — Ambulatory Visit (INDEPENDENT_AMBULATORY_CARE_PROVIDER_SITE_OTHER): Admitting: Nurse Practitioner

## 2024-07-09 VITALS — BP 130/88 | HR 82 | Temp 98.0°F | Ht 72.0 in | Wt 222.0 lb

## 2024-07-09 DIAGNOSIS — I10 Essential (primary) hypertension: Secondary | ICD-10-CM

## 2024-07-09 DIAGNOSIS — K5792 Diverticulitis of intestine, part unspecified, without perforation or abscess without bleeding: Secondary | ICD-10-CM

## 2024-07-09 MED ORDER — CIPROFLOXACIN HCL 500 MG PO TABS: 500.0000 mg | ORAL_TABLET | Freq: Two times a day (BID) | ORAL | 0 refills | Status: AC

## 2024-07-09 MED ORDER — METRONIDAZOLE 500 MG PO TABS: 500.0000 mg | ORAL_TABLET | Freq: Two times a day (BID) | ORAL | 0 refills | Status: AC

## 2024-07-09 NOTE — Telephone Encounter (Signed)
 notified

## 2024-07-09 NOTE — Telephone Encounter (Signed)
 Can make appt to address

## 2024-07-09 NOTE — Telephone Encounter (Signed)
 Patient last OV today with PCP. Patient's wife wants to notify PCP patient has had 2 falls in the past few months, and did not think patient would report in OV today. See NT encounter. Recommended evaluation if patient hit head and go to ED. Please advise.      FYI Only or Action Required?: Action required by provider: update on patient condition.  Patient was last seen in primary care on 07/09/2024 by Gareth [name] FALCON, FNP.  Called Nurse Triage reporting Fall.  Symptoms began a week ago.  Interventions attempted: Nothing.  Symptoms are: gradually worsening.  Triage Disposition: Call PCP Now  Patient/caregiver understands and will follow disposition?: No, wishes to speak with PCP      Copied from CRM #[id number]. Topic: Clinical - Red Word Triage >> Jul 09, 2024 12:06 PM Rachelle R wrote: [facility] that prompted transfer to Nurse Triage: Patient wife Gaetana states the patient has fallen twice in the last few weeks. Has a large bruise from his last fall. Anytime he sits down he falls asleep. Has had two strokes in the past and is unsure what is going on but she knows something is off based on his demeanor. Reason for Disposition  [1] Caller has URGENT question AND [2] triager unable to answer question  Answer Assessment - Initial Assessment Questions Last OV today. Patient 's wife calling to request to notify PCP of patient having 2 episodes of falling in the past few months. Takes plavix  and ASA. Unsure if patient hit head. Patient becomes agitated with wife asking too many questions regarding falls. Recommended patient be evaluated and if any worsening sx blurred vision, N/T either side, vomiting, worsening dizziness, another fall call 911 and go to ED for evaluation. Patient wife requesting if any thing else PCP requests for her to do.     1. MECHANISM: How did the fall happen?     Wife on DPR reports patient has had 2 falls in the past few months that she does not feel he is  reported to PCP. Last fall last week possibly tripped and fell hitting right upper arm unsure if patient hit head. 2. DOMESTIC VIOLENCE AND ELDER ABUSE SCREENING: Did you fall because someone pushed you or tried to hurt you? If Yes, ask: Are you safe now?     na 3. ONSET: When did the fall happen? (e.g., minutes, hours, or days ago)     Last week  4. LOCATION: What part of the body hit the ground? (e.g., back, buttocks, head, hips, knees, hands, head, stomach)     Hit right upper arm and now bruised big as baseball. 5. INJURY: Did you hurt (injure) yourself when you fell? If Yes, ask: What did you injure? Tell me more about this? (e.g., body area; type of injury; pain severity)     Bruising to right upper arm 6. PAIN: Is there any pain? If Yes, ask: How bad is the pain? (e.g., Scale 0-10; or none, mild,      Not sure  7. SIZE: For cuts, bruises, or swelling, ask: How large is it? (e.g., inches or centimeters)      Bruising right upper arm big as baseball. 8. PREGNANCY: Is there any chance you are pregnant? When was your last menstrual period?     na 9. OTHER SYMPTOMS: Do you have any other symptoms? (e.g., dizziness, fever, weakness; new-onset or worsening).      Dizziness at times. Walks hump back  10. CAUSE: What do  you think caused the fall (or falling)? (e.g., dizzy spell, tripped)       Tripped, dizziness at times, agitates easily , falls asleep a lot.  Protocols used: Falls and [facility]

## 2024-07-09 NOTE — Progress Notes (Signed)
 BP 130/88   Pulse 82   Temp 98 F (36.7 C)   Ht 6' (1.829 m)   Wt 222 lb (100.7 kg)   SpO2 99%   BMI 30.11 kg/m    Subjective:    Patient ID: Jared Cline, male    DOB: 1946-01-04, 78 y.o.   MRN: [id number]  HPI: Jared Cline is a 78 y.o. male  Chief Complaint  Patient presents with   GI Problem    Pt c/o stomach pain after eating. States he would like medication for his diverticulitis.    Discussed the use of AI scribe software for clinical note transcription with the patient, who gave verbal consent to proceed.  History of Present Illness Jared Cline is a 78 year old male with diverticulitis who presents with abdominal pain.  Abdominal pain - Abdominal pain present for the past three weeks - Pain occurs every morning and subsides by 9 AM - Pain associated with dietary triggers, specifically corn and rice - History of diverticulitis - No fever - No changes in bowel movements  Bowel habits and iron  supplementation - Currently taking iron  tablets - Previous iron  formulation caused constipation - Current iron  tablets allow for regular bowel movements  Ecchymosis - Bruise present, attributed to a recent minor accident - Currently taking blood thinners, which may contribute to bruising - Bruise is healing and changing color  History of prostate cancer - History of prostate cancer treated with prostatectomy - Cancer was localized and successfully removed         07/08/2024    9:51 AM 05/07/2024    1:25 PM 03/22/2024   10:21 AM  Depression screen PHQ 2/9  Decreased Interest 0 0 0  Down, Depressed, Hopeless 1 0 0  PHQ - 2 Score 1 0 0  Altered sleeping 0    Tired, decreased energy 0    Change in appetite 0    Feeling bad or failure about yourself  0    Trouble concentrating 0    Moving slowly or fidgety/restless 0    Suicidal thoughts 0    PHQ-9 Score 1    Difficult doing work/chores Not difficult at all      Relevant past medical, surgical, family and  social history reviewed and updated as indicated. Interim medical history since our last visit reviewed. Allergies and medications reviewed and updated.  Review of Systems  Ten systems reviewed and is negative except as mentioned in HPI      Objective:      BP 130/88   Pulse 82   Temp 98 F (36.7 C)   Ht 6' (1.829 m)   Wt 222 lb (100.7 kg)   SpO2 99%   BMI 30.11 kg/m    Wt Readings from Last 3 Encounters:  07/09/24 222 lb (100.7 kg)  05/07/24 217 lb 8 oz (98.7 kg)  03/22/24 219 lb 1.6 oz (99.4 kg)    Physical Exam GENERAL: Alert, cooperative, well developed, no acute distress HEENT: Normocephalic, normal oropharynx, moist mucous membranes CHEST: Clear to auscultation bilaterally, no wheezes, rhonchi, or crackles CARDIOVASCULAR: Normal heart rate and rhythm, S1 and S2 normal without murmurs ABDOMEN: Soft, diffuse tenderness to palpation, non-distended, without organomegaly, normal bowel sounds EXTREMITIES: No cyanosis or edema NEUROLOGICAL: Cranial nerves grossly intact, moves all extremities without gross motor or sensory deficit SKIN: Bruise on arm, starting to turn green, indicating healing  Results for orders placed or performed in visit on 05/07/24  Ferritin   Collection Time:  05/07/24  1:01 PM  Result Value Ref Range   Ferritin 59 24 - 336 ng/mL  Iron  and TIBC   Collection Time: 05/07/24  1:01 PM  Result Value Ref Range   Iron  49 45 - 182 ug/dL   TIBC 653 749 - 549 ug/dL   Saturation Ratios 14 (L) 17.9 - 39.5 %   UIBC 297 ug/dL  Basic Metabolic Panel - Cancer Center Only   Collection Time: 05/07/24  1:01 PM  Result Value Ref Range   Sodium 134 (L) 135 - 145 mmol/L   Potassium 4.7 3.5 - 5.1 mmol/L   Chloride 104 98 - 111 mmol/L   CO2 23 22 - 32 mmol/L   Glucose, Bld 96 70 - 99 mg/dL   BUN 20 8 - 23 mg/dL   Creatinine 8.44 (H) 9.38 - 1.24 mg/dL   Calcium  9.3 8.9 - 10.3 mg/dL   GFR, Estimated 45 (L) >60 mL/min   Anion gap 7 5 - 15  CBC with Differential  (Cancer Center Only)   Collection Time: 05/07/24  1:01 PM  Result Value Ref Range   WBC Count 9.4 4.0 - 10.5 K/uL   RBC 4.29 4.22 - 5.81 MIL/uL   Hemoglobin 12.0 (L) 13.0 - 17.0 g/dL   HCT 62.9 (L) 60.9 - 47.9 %   MCV 86.2 80.0 - 100.0 fL   MCH 28.0 26.0 - 34.0 pg   MCHC 32.4 30.0 - 36.0 g/dL   RDW 87.5 88.4 - 84.4 %   Platelet Count 371 150 - 400 K/uL   nRBC 0.0 0.0 - 0.2 %   Neutrophils Relative % 67 %   Neutro Abs 6.2 1.7 - 7.7 K/uL   Lymphocytes Relative 23 %   Lymphs Abs 2.1 0.7 - 4.0 K/uL   Monocytes Relative 8 %   Monocytes Absolute 0.8 0.1 - 1.0 K/uL   Eosinophils Relative 1 %   Eosinophils Absolute 0.1 0.0 - 0.5 K/uL   Basophils Relative 0 %   Basophils Absolute 0.0 0.0 - 0.1 K/uL   Immature Granulocytes 1 %   Abs Immature Granulocytes 0.10 (H) 0.00 - 0.07 K/uL          Assessment & Plan:   Problem List Items Addressed This Visit   None Visit Diagnoses       Diverticulitis    -  Primary   Relevant Medications   metroNIDAZOLE  (FLAGYL ) 500 MG tablet   ciprofloxacin  (CIPRO ) 500 MG tablet        Assessment and Plan Assessment & Plan Diverticulitis of intestine without perforation or abscess Chronic diverticulitis with intermittent abdominal pain for three weeks, localized to the lower abdomen, occurring every morning until 9 AM and resolving by then. No fever or changes in bowel movements. Recent dietary indiscretion with corn and rice may have exacerbated symptoms. Tenderness on palpation. - Prescribed antibiotics for diverticulitis. - Advised clear liquid diet for the next few days to allow bowel settling. - Instructed to reintroduce food gradually after symptoms improve. - Instructed to monitor symptoms and report if no improvement by Monday. - Will plan for CT scan/ labs if symptoms do not improve by Monday.        Follow up plan: Return for monday if no improvement.

## 2024-07-10 NOTE — Telephone Encounter (Signed)
 Requested Prescriptions  Pending Prescriptions Disp Refills   cloNIDine  (CATAPRES ) 0.2 MG tablet [Pharmacy Med Name: CLONIDINE  0.2MG  TABLETS] 180 tablet 0    Sig: TAKE 1 TABLET(0.2 MG) BY MOUTH TWICE DAILY     Cardiovascular:  Alpha-2 Agonists Passed - 07/10/2024  4:17 PM      Passed - Last BP in normal range    BP Readings from Last 1 Encounters:  07/09/24 130/88         Passed - Last Heart Rate in normal range    Pulse Readings from Last 1 Encounters:  07/09/24 82         Passed - Valid encounter within last 6 months    Recent Outpatient Visits           Yesterday Diverticulitis   [facility] Gareth [name] FALCON, FNP   3 months ago Aortic atherosclerosis   [facility] Gareth [name] FALCON, FNP   7 months ago Generalized abdominal pain   [facility] Gareth [name] FALCON, FNP   7 months ago Neuropathy   [facility] Gareth [name] FALCON, FNP   8 months ago Generalized abdominal pain   [facility] Gareth [name] FALCON, FNP       Future Appointments             In 3 months Francisca, Redell BROCKS, MD [facility] [place]

## 2024-07-12 ENCOUNTER — Emergency Department
Admission: EM | Admit: 2024-07-12 | Discharge: 2024-07-12 | Disposition: A | Discharge status: Home / Self Care | Attending: Emergency Medicine | Admitting: Emergency Medicine

## 2024-07-12 ENCOUNTER — Other Ambulatory Visit: Payer: Self-pay

## 2024-07-12 ENCOUNTER — Encounter: Payer: Self-pay | Admitting: Nurse Practitioner

## 2024-07-12 ENCOUNTER — Ambulatory Visit: Admitting: Nurse Practitioner

## 2024-07-12 ENCOUNTER — Ambulatory Visit: Attending: Nurse Practitioner

## 2024-07-12 ENCOUNTER — Encounter (INDEPENDENT_AMBULATORY_CARE_PROVIDER_SITE_OTHER): Payer: Self-pay | Admitting: Vascular Surgery

## 2024-07-12 ENCOUNTER — Encounter: Payer: Self-pay | Admitting: *Deleted

## 2024-07-12 ENCOUNTER — Ambulatory Visit (INDEPENDENT_AMBULATORY_CARE_PROVIDER_SITE_OTHER): Payer: Medicare Other

## 2024-07-12 ENCOUNTER — Telehealth: Payer: Self-pay

## 2024-07-12 ENCOUNTER — Ambulatory Visit (INDEPENDENT_AMBULATORY_CARE_PROVIDER_SITE_OTHER): Payer: Medicare Other | Admitting: Vascular Surgery

## 2024-07-12 VITALS — BP 172/78 | HR 60 | Resp 18 | Wt 219.0 lb

## 2024-07-12 VITALS — BP 148/70 | HR 74 | Temp 97.6°F | Ht 72.0 in | Wt 218.0 lb

## 2024-07-12 DIAGNOSIS — I6521 Occlusion and stenosis of right carotid artery: Secondary | ICD-10-CM | POA: Diagnosis not present

## 2024-07-12 DIAGNOSIS — K5792 Diverticulitis of intestine, part unspecified, without perforation or abscess without bleeding: Secondary | ICD-10-CM | POA: Diagnosis not present

## 2024-07-12 DIAGNOSIS — I10 Essential (primary) hypertension: Secondary | ICD-10-CM | POA: Diagnosis not present

## 2024-07-12 DIAGNOSIS — I25119 Atherosclerotic heart disease of native coronary artery with unspecified angina pectoris: Secondary | ICD-10-CM

## 2024-07-12 DIAGNOSIS — I739 Peripheral vascular disease, unspecified: Secondary | ICD-10-CM | POA: Diagnosis not present

## 2024-07-12 DIAGNOSIS — I6523 Occlusion and stenosis of bilateral carotid arteries: Secondary | ICD-10-CM

## 2024-07-12 DIAGNOSIS — E782 Mixed hyperlipidemia: Secondary | ICD-10-CM

## 2024-07-12 DIAGNOSIS — R1032 Left lower quadrant pain: Secondary | ICD-10-CM | POA: Diagnosis not present

## 2024-07-12 LAB — CBC
HCT: 35.1 % — ABNORMAL LOW (ref 39.0–52.0)
Hemoglobin: 11.4 g/dL — ABNORMAL LOW (ref 13.0–17.0)
MCH: 28.2 pg (ref 26.0–34.0)
MCHC: 32.5 g/dL (ref 30.0–36.0)
MCV: 86.9 fL (ref 80.0–100.0)
Platelets: 306 K/uL (ref 150–400)
RBC: 4.04 MIL/uL — ABNORMAL LOW (ref 4.22–5.81)
RDW: 12.4 % (ref 11.5–15.5)
WBC: 4.7 K/uL (ref 4.0–10.5)
nRBC: 0 % (ref 0.0–0.2)

## 2024-07-12 LAB — POCT URINALYSIS DIPSTICK
Bilirubin, UA: NEGATIVE
Blood, UA: NEGATIVE
Glucose, UA: NEGATIVE
Leukocytes, UA: NEGATIVE
Nitrite, UA: NEGATIVE
Protein, UA: NEGATIVE
Spec Grav, UA: 1.015 (ref 1.010–1.025)
Urobilinogen, UA: 0.2 U/dL
pH, UA: 5 (ref 5.0–8.0)

## 2024-07-12 LAB — COMPREHENSIVE METABOLIC PANEL WITH GFR
ALT: 16 U/L (ref 0–44)
AST: 27 U/L (ref 15–41)
Albumin: 4.4 g/dL (ref 3.5–5.0)
Alkaline Phosphatase: 49 U/L (ref 38–126)
Anion gap: 10 (ref 5–15)
BUN: 14 mg/dL (ref 8–23)
CO2: 22 mmol/L (ref 22–32)
Calcium: 9.5 mg/dL (ref 8.9–10.3)
Chloride: 102 mmol/L (ref 98–111)
Creatinine, Ser: 1.34 mg/dL — ABNORMAL HIGH (ref 0.61–1.24)
GFR, Estimated: 54 mL/min — ABNORMAL LOW (ref >60)
Glucose, Bld: 88 mg/dL (ref 70–99)
Potassium: 5 mmol/L (ref 3.5–5.1)
Sodium: 135 mmol/L (ref 135–145)
Total Bilirubin: 0.5 mg/dL (ref 0.0–1.2)
Total Protein: 6.9 g/dL (ref 6.5–8.1)

## 2024-07-12 LAB — URINALYSIS, ROUTINE W REFLEX MICROSCOPIC
Bilirubin Urine: NEGATIVE
Glucose, UA: NEGATIVE mg/dL
Hgb urine dipstick: NEGATIVE
Ketones, ur: NEGATIVE mg/dL
Leukocytes,Ua: NEGATIVE
Nitrite: NEGATIVE
Protein, ur: NEGATIVE mg/dL
Specific Gravity, Urine: 1.018 (ref 1.005–1.030)
pH: 6 (ref 5.0–8.0)

## 2024-07-12 LAB — LIPASE, BLOOD: Lipase: 19 U/L (ref 11–51)

## 2024-07-12 MED ORDER — AMOXICILLIN-POT CLAVULANATE 875-125 MG PO TABS: 1.0000 | ORAL_TABLET | Freq: Two times a day (BID) | ORAL | 0 refills | Status: AC

## 2024-07-12 NOTE — Addendum Note (Signed)
 Addended by: Amante Fomby F on: 07/12/2024 02:40 PM   Modules accepted: Orders

## 2024-07-12 NOTE — Progress Notes (Signed)
 MRN : [id number]  Jared Cline is a 78 y.o. (1946/06/11) male who presents with chief complaint of check carotid arteries.  History of Present Illness:   The patient is seen for follow up evaluation of carotid stenosis status post right carotid stent placement on 11/26/2022.     Procedure:  Placement of a 9 x 7 x 40 exact stent with the use of the NAV-6 embolic protection device in the right internal carotid artery    The patient denies interval amaurosis fugax. There is no recent history of TIA symptoms or focal motor deficits that would be attributable to the carotid distribution.  Since his last visit he was seen at [facility] where they identified some occlusive disease in the posterior circulation.  Since he is already taking dual antiplatelet therapy they told him that was appropriate and appropriately did not recommend surgery or intervention.   The patient denies headache.   The patient is taking enteric-coated aspirin  81 mg daily.   No recent shortening of the patient's walking distance or new symptoms consistent with claudication.  No history of rest pain symptoms. No new ulcers or wounds of the lower extremities have occurred.   There is no history of DVT, PE or superficial thrombophlebitis. No recent episodes of angina or shortness of breath documented.    Duplex ultrasound of the carotid artery team today demonstrates 1 to 39% bilateral ICA stenosis.  The right internal carotid artery stent is widely patent with normal velocities throughout.  No change compared to the previous study  Current Meds  Medication Sig   acetaminophen  (TYLENOL ) 650 MG CR tablet Take 2 tablets (1,300 mg total) by mouth every 12 (twelve) hours as needed (Mild pain).   aspirin  EC 81 MG tablet Take 1 tablet (81 mg total) by mouth daily at 6 (six) AM. Swallow whole.   ciprofloxacin  (CIPRO ) 500 MG tablet Take 1 tablet (500 mg total) by mouth 2 (two) times daily for 7 days.    cloNIDine  (CATAPRES ) 0.2 MG tablet TAKE 1 TABLET(0.2 MG) BY MOUTH TWICE DAILY   clopidogrel  (PLAVIX ) 75 MG tablet TAKE 1 TABLET(75 MG) BY MOUTH DAILY   cyanocobalamin  (VITAMIN B12) 1000 MCG tablet Take 1,000 mcg by mouth daily.   diclofenac  Sodium (VOLTAREN ) 1 % GEL Apply 2 g topically 4 (four) times daily.   hydrALAZINE  (APRESOLINE ) 10 MG tablet TAKE 1 TABLET BY MOUTH TWICE DAILY AS NEEDED, TAKE IF BLOOD PRESSURE GREATER THAN 140/90   hydrochlorothiazide  (HYDRODIURIL ) 12.5 MG tablet    labetalol  (NORMODYNE ) 100 MG tablet 100 mg 2 (two) times daily.   magnesium oxide (MAG-OX) 400 MG tablet 1 tablet 500mg  Orally Once a day   metroNIDAZOLE  (FLAGYL ) 500 MG tablet Take 1 tablet (500 mg total) by mouth 2 (two) times daily for 7 days.   pravastatin  (PRAVACHOL ) 80 MG tablet TAKE 1 TABLET(80 MG) BY MOUTH AT BEDTIME   torsemide  (DEMADEX ) 20 MG tablet TAKE 1 TABLET(20 MG) BY MOUTH TWICE DAILY   valsartan  (DIOVAN ) 160 MG tablet Take 1 tablet (160 mg total) by mouth daily.    Past Medical History:  Diagnosis Date   Anemia in chronic kidney disease    BPH (benign prostatic hypertrophy)    Bradycardia    evaluated by Dr. Hester   Cancer [facility])    prostate   Carpal tunnel syndrome, left  left arm   CKD (chronic kidney disease) stage 2, GFR 60-89 ml/min    Diverticulitis    Gastritis 2017   History of prostate cancer    HNP (herniated nucleus pulposus), lumbar    Hypercalcemia    Hyperlipidemia    Hypertension    IBS (irritable bowel syndrome)    Impotence    Multiple lung nodules    nonspecific, largest 5mm in Jan 2016; scanned March 2017; rescan 3-6 months later   OA (osteoarthritis) of knee    right   Personal history of prostate cancer    Pulmonary embolism (HCC)    Renal mass    evaluated; followed by urologist   Sleep apnea    on CPAP   Stroke (HCC)    Torn meniscus    x 2; right knee    Past Surgical History:  Procedure Laterality Date   abdominal ultrasound  Oct 2015    Negative for eval for AAA   CAROTID PTA/STENT INTERVENTION Right 11/26/2022   Procedure: CAROTID PTA/STENT INTERVENTION;  Surgeon: Jama Cordella MATSU, MD;  Location: [facility] INVASIVE CV LAB;  Service: Cardiovascular;  Laterality: Right;   COLONOSCOPY     COLONOSCOPY WITH PROPOFOL  N/A 12/17/2023   Procedure: COLONOSCOPY WITH PROPOFOL ;  Surgeon: Toledo, Ladell POUR, MD;  Location: [facility] ENDOSCOPY;  Service: Gastroenterology;  Laterality: N/A;   ESOPHAGOGASTRODUODENOSCOPY (EGD) WITH PROPOFOL  N/A 12/17/2023   Procedure: ESOPHAGOGASTRODUODENOSCOPY (EGD) WITH PROPOFOL ;  Surgeon: Toledo, Ladell POUR, MD;  Location: [facility] ENDOSCOPY;  Service: Gastroenterology;  Laterality: N/A;   KNEE ARTHROSCOPY Right    x 2   LUMBAR LAMINECTOMY     PROSTATECTOMY     robotic    Social History Social History   Tobacco Use   Smoking status: Former    Current packs/day: 0.00    Average packs/day: 0.5 packs/day for 9.0 years (4.5 ttl pk-yrs)    Types: Cigarettes    Start date: 08/19/1962    Quit date: 08/20/1971    Years since quitting: 52.9    Passive exposure: Past   Smokeless tobacco: Never  Vaping Use   Vaping status: Never Used  Substance Use Topics   Alcohol use: No    Alcohol/week: 0.0 standard drinks of alcohol   Drug use: No    Family History Family History  Problem Relation Age of Onset   Stroke Mother    Hypertension Mother    Aneurysm Father        cerebral   Hypertension Father    Heart disease Paternal Uncle    Alcohol abuse Brother    Cancer Neg Hx    COPD Neg Hx    Diabetes Neg Hx     Allergies  Allergen Reactions   Flomax [Tamsulosin Hcl] Shortness Of Breath   Lisinopril     Other Reaction(s): angioedema   Ace Inhibitors Other (See Comments)    Angioedema   Gabapentin  Other (See Comments)    Dizzy headache   Lipitor [Atorvastatin ] Other (See Comments)    Memory loss   Amlodipine Other (See Comments) and Rash    Angioedema  amlodipine   Doxazosin Rash    doxazosin      REVIEW OF SYSTEMS (Negative unless checked)  Constitutional: [] Weight loss  [] Fever  [] Chills Cardiac: [] Chest pain   [] Chest pressure   [] Palpitations   [] Shortness of breath when laying flat   [] Shortness of breath with exertion. Vascular:  [x] Pain in legs with walking   [] Pain in legs at rest  [] History  of DVT   [] Phlebitis   [] Swelling in legs   [] Varicose veins   [] Non-healing ulcers Pulmonary:   [] Uses home oxygen   [] Productive cough   [] Hemoptysis   [] Wheeze  [] COPD   [] Asthma Neurologic:  [] Dizziness   [] Seizures   [] History of stroke   [] History of TIA  [] Aphasia   [] Vissual changes   [] Weakness or numbness in arm   [] Weakness or numbness in leg Musculoskeletal:   [] Joint swelling   [] Joint pain   [] Low back pain Hematologic:  [] Easy bruising  [] Easy bleeding   [] Hypercoagulable state   [] Anemic Gastrointestinal:  [] Diarrhea   [] Vomiting  [] Gastroesophageal reflux/heartburn   [] Difficulty swallowing. Genitourinary:  [] Chronic kidney disease   [] Difficult urination  [] Frequent urination   [] Blood in urine Skin:  [] Rashes   [] Ulcers  Psychological:  [] History of anxiety   []  History of major depression.  Physical Examination  Vitals:   07/12/24 0940  BP: (!) 172/78  Pulse: 60  Resp: 18  Weight: 219 lb (99.3 kg)   Body mass index is 29.7 kg/m. Gen: WD/WN, NAD Head: [place]/AT, No temporalis wasting.  Ear/Nose/Throat: Hearing grossly intact, nares w/o erythema or drainage Eyes: PER, EOMI, sclera nonicteric.  Neck: Supple, no masses.  No bruit or JVD.  Pulmonary:  Good air movement, no audible wheezing, no use of accessory muscles.  Cardiac: RRR, normal S1, S2, no Murmurs. Vascular:  carotid bruit noted Vessel Right Left  Radial Palpable Palpable  Carotid  Palpable  Palpable  PT Not palpable Not palpable  DP Not palpable Not palpable  Gastrointestinal: soft, non-distended. No guarding/no peritoneal signs.  Musculoskeletal: M/S 5/5 throughout.  No visible deformity.   Neurologic: CN 2-12 intact. Pain and light touch intact in extremities.  Symmetrical.  Speech is fluent. Motor exam as listed above. Psychiatric: Judgment intact, Mood & affect appropriate for pt's clinical situation. Dermatologic: No rashes or ulcers noted.  No changes consistent with cellulitis.   CBC Lab Results  Component Value Date   WBC 9.4 05/07/2024   HGB 12.0 (L) 05/07/2024   HCT 37.0 (L) 05/07/2024   MCV 86.2 05/07/2024   PLT 371 05/07/2024    BMET    Component Value Date/Time   NA 134 (L) 05/07/2024 1301   NA 135 10/19/2015 1048   NA 133 (L) 08/25/2014 1020   K 4.7 05/07/2024 1301   K 4.1 08/25/2014 1020   CL 104 05/07/2024 1301   CL 100 08/25/2014 1020   CO2 23 05/07/2024 1301   CO2 27 08/25/2014 1020   GLUCOSE 96 05/07/2024 1301   GLUCOSE 95 08/25/2014 1020   BUN 20 05/07/2024 1301   BUN 12 10/19/2015 1048   BUN 13 08/25/2014 1020   CREATININE 1.55 (H) 05/07/2024 1301   CREATININE 1.35 (H) 03/22/2024 1043   CALCIUM  9.3 05/07/2024 1301   CALCIUM  9.1 08/25/2014 1020   GFRNONAA 45 (L) 05/07/2024 1301   GFRNONAA 49 (L) 07/21/2020 0930   GFRAA 57 (L) 07/21/2020 0930   CrCl cannot be calculated (Patient's most recent lab result is older than the maximum 21 days allowed.).  COAG Lab Results  Component Value Date   INR 1.0 12/03/2023    Radiology No results found.   Assessment/Plan 1. Carotid artery stenosis, symptomatic, right (Primary) Recommend:   The patient is s/p successful right carotid stent   Duplex ultrasound of the carotid artery team today demonstrates 1 to 39% bilateral ICA stenosis.  The right internal carotid artery stent is widely patent  with normal velocities throughout.   Continue dual antiplatelet therapy as prescribed given the finding of posterior circulation atherosclerotic changes.  He is tolerating dual antiplatelet therapy well and therefore this should continue indefinitely.    Continue management of CAD, HTN and  Hyperlipidemia Healthy heart diet,  encouraged exercise at least 4 times per week   The patient's NIHSS score is as follows: 3 Mild: 1 - 5 Mild to Moderately Severe: 5 - 14 Severe: 15 - 24 Very Severe: >25   Follow up in 12 months with duplex ultrasound and physical exam based on the patient's carotid surgery and <30% stenosis of the bilateral carotid artery  - VAS US  CAROTID; Future  2. PAD (peripheral artery disease)  Recommend:  The patient has evidence of atherosclerosis of the lower extremities with claudication.  The patient does not voice lifestyle limiting changes at this point in time.  Noninvasive studies do not suggest clinically significant change.  No invasive studies, angiography or surgery at this time The patient should continue walking and begin a more formal exercise program.  The patient should continue antiplatelet therapy and aggressive treatment of the lipid abnormalities  No changes in the patient's medications at this time  Continued surveillance is indicated as atherosclerosis is likely to progress with time.    The patient will continue follow up with noninvasive studies as ordered.  - VAS US  ABI WITH/WO TBI; Future  3. Coronary artery disease involving native coronary artery of native heart with angina pectoris Continue cardiac and antihypertensive medications as already ordered and reviewed, no changes at this time.  Continue statin as ordered and reviewed, no changes at this time  Nitrates PRN for chest pain  4. Primary hypertension Continue antihypertensive medications as already ordered, these medications have been reviewed and there are no changes at this time.  5. Mixed hyperlipidemia Continue statin as ordered and reviewed, no changes at this time    Cordella Shawl, MD  07/12/2024 9:54 AM

## 2024-07-12 NOTE — ED Triage Notes (Signed)
 Pt to triage via wheelchair.   Pt reports abd pain with hx diverticulitis.  Pt states he was sent from his doctor's office for eval.  No chest pain or sob.  No v/d  pt alert  speech clear.

## 2024-07-12 NOTE — ED Notes (Signed)
 Pt made aware of need for urine sample. Pt ambulated to the bathroom without assistance using his personal cane.

## 2024-07-12 NOTE — Progress Notes (Signed)
 BP (!) 148/70   Pulse 74   Temp 97.6 F (36.4 C)   Ht 6' (1.829 m)   Wt 218 lb (98.9 kg)   SpO2 99%   BMI 29.57 kg/m    Subjective:    Patient ID: Jared Cline, male    DOB: 1946-06-19, 78 y.o.   MRN: [id number]  HPI: Jared Cline is a 78 y.o. male  Chief Complaint  Patient presents with   Abdominal Pain   Discussed the use of AI scribe software for clinical note transcription with the patient, who gave verbal consent to proceed.  History of Present Illness Jared Cline is a 78 year old male with diverticulitis who presents with persistent abdominal pain.  Abdominal pain - Persistent abdominal pain described as 'tender all over' -denies any urinary complaints.  - No relief since last visit, Friday - No associated constipation or fever -patient initally thought his pain was from diverticulitis.  He was not overly tender on Friday so we did clear liquid diet and cipro  and flagyl .  Patient reports today no improvement.  On exam he is more tender.  Will get stat labs and ct.   Bowel function - Continues to have bowel movements  Recent interventions - Started on clear liquid diet - Prescribed Flagyl  and Cipro  - No improvement in symptoms despite interventions         07/08/2024    9:51 AM 05/07/2024    1:25 PM 03/22/2024   10:21 AM  Depression screen PHQ 2/9  Decreased Interest 0 0 0  Down, Depressed, Hopeless 1 0 0  PHQ - 2 Score 1 0 0  Altered sleeping 0    Tired, decreased energy 0    Change in appetite 0    Feeling bad or failure about yourself  0    Trouble concentrating 0    Moving slowly or fidgety/restless 0    Suicidal thoughts 0    PHQ-9 Score 1    Difficult doing work/chores Not difficult at all      Relevant past medical, surgical, family and social history reviewed and updated as indicated. Interim medical history since our last visit reviewed. Allergies and medications reviewed and updated.  Review of Systems  Ten systems reviewed and is  negative except as mentioned in HPI      Objective:      BP (!) 148/70   Pulse 74   Temp 97.6 F (36.4 C)   Ht 6' (1.829 m)   Wt 218 lb (98.9 kg)   SpO2 99%   BMI 29.57 kg/m    Wt Readings from Last 3 Encounters:  07/12/24 218 lb (98.9 kg)  07/12/24 219 lb (99.3 kg)  07/09/24 222 lb (100.7 kg)    Physical Exam GENERAL: Alert, cooperative, well developed, no acute distress. HEENT: Normocephalic, normal oropharynx, moist mucous membranes. CHEST: Clear to auscultation bilaterally, no wheezes, rhonchi, or crackles. CARDIOVASCULAR: Normal heart rate and rhythm, S1 and S2 normal without murmurs. ABDOMEN: Very tender to palpation, non-distended, without organomegaly, normal bowel sounds. EXTREMITIES: No cyanosis or edema. NEUROLOGICAL: Cranial nerves grossly intact, moves all extremities without gross motor or sensory deficit.  Results for orders placed or performed in visit on 07/12/24  POCT urinalysis dipstick   Collection Time: 07/12/24  1:54 PM  Result Value Ref Range   Color, UA dark yellow    Clarity, UA clear    Glucose, UA Negative Negative   Bilirubin, UA neg    Ketones, UA moderate  Spec Grav, UA 1.015 1.010 - 1.025   Blood, UA neg    pH, UA 5.0 5.0 - 8.0   Protein, UA Negative Negative   Urobilinogen, UA 0.2 0.2 or 1.0 E.U./dL   Nitrite, UA neg    Leukocytes, UA Negative Negative   Appearance     Odor            Assessment & Plan:   Problem List Items Addressed This Visit   None Visit Diagnoses       LLQ abdominal pain    -  Primary     Left lower quadrant abdominal pain       Relevant Orders   POCT urinalysis dipstick (Completed)   CBC with Differential/Platelet   Comprehensive metabolic panel with GFR   CT ABDOMEN PELVIS W CONTRAST   Lipase, blood        Assessment and Plan Assessment & Plan LLQ abdominal pain/generalized tenderness all over Abdominal pain  persist despite initial treatment with Flagyl  and Cipro  and clear liquid diet.  No improvement in symptoms, including abdominal tenderness and pain. No fever or constipation reported. Urine dip positive for ketones, no leukocytes or blood. Abdominal tenderness increased since last visit. - Ordered STAT labs and CT scan - Referred to hospital for further evaluation and management  Left lower quadrant pain Persistent left lower quadrant pain associated with diverticulitis. No improvement with current treatment regimen. - Address underlying diverticulitis as per plan        Follow up plan: Return if symptoms worsen or fail to improve.

## 2024-07-12 NOTE — ED Provider Notes (Signed)
 [facility] Provider Note    Event Date/Time   First MD Initiated Contact with Patient 07/12/24 1650     (approximate)   History   Abdominal Pain   HPI  Jared Cline is a 78 y.o. male with a history of diverticulitis presents with complaints of abdominal pain in the left lower quadrant.  He reports this is consistent with prior episodes of diverticulitis and that he usually gets better with p.o. antibiotics and that is why he is here.  He denies fevers chills nausea or vomiting.  He reports the pain is mild to moderate and has been present for at least a couple of days     Physical Exam   Triage Vital Signs: ED Triage Vitals  Encounter Vitals Group     BP 07/12/24 1522 137/65     Girls Systolic BP Percentile --      Girls Diastolic BP Percentile --      Boys Systolic BP Percentile --      Boys Diastolic BP Percentile --      Pulse Rate 07/12/24 1522 61     Resp 07/12/24 1522 20     Temp 07/12/24 1522 98.5 F (36.9 C)     Temp Source 07/12/24 1746 Oral     SpO2 07/12/24 1522 100 %     Weight 07/12/24 1526 98.4 kg (217 lb)     Height 07/12/24 1526 1.829 m (6')     Head Circumference --      Peak Flow --      Pain Score 07/12/24 1525 9     Pain Loc --      Pain Education --      Exclude from Growth Chart --     Most recent vital signs: Vitals:   07/12/24 1522 07/12/24 1746  BP: 137/65 (!) 182/72  Pulse: 61 77  Resp: 20 18  Temp: 98.5 F (36.9 C) 97.8 F (36.6 C)  SpO2: 100% 100%     General: Awake, no distress.  CV:  Good peripheral perfusion.  Resp:  Normal effort.  Abd:  No distention.  Minimal tenderness in the left lower quadrant, no CVA tenderness Other:     ED Results / Procedures / Treatments   Labs (all labs ordered are listed, but only abnormal results are displayed) Labs Reviewed  COMPREHENSIVE METABOLIC PANEL WITH GFR - Abnormal; Notable for the following components:      Result Value   Creatinine, Ser 1.34 (*)     GFR, Estimated 54 (*)    All other components within normal limits  CBC - Abnormal; Notable for the following components:   RBC 4.04 (*)    Hemoglobin 11.4 (*)    HCT 35.1 (*)    All other components within normal limits  URINALYSIS, ROUTINE W REFLEX MICROSCOPIC - Abnormal; Notable for the following components:   Color, Urine YELLOW (*)    APPearance CLEAR (*)    All other components within normal limits  LIPASE, BLOOD     EKG     RADIOLOGY     PROCEDURES:  Critical Care performed:   Procedures   MEDICATIONS ORDERED IN ED: Medications - No data to display   IMPRESSION / MDM / ASSESSMENT AND PLAN / ED COURSE  I reviewed the triage vital signs and the nursing notes. Patient's presentation is most consistent with exacerbation of chronic illness.  Patient presents with left lower quadrant abdominal pain as detailed above, he feels confident  this is related to diverticulitis which it certainly could be.  No CVA tenderness  Lab work is quite reassuring, normal white blood cell count.  Given reassuring workup, reassuring exam and his history we will treat with Augmentin         FINAL CLINICAL IMPRESSION(S) / ED DIAGNOSES   Final diagnoses:  Diverticulitis     Rx / DC Orders   ED Discharge Orders          Ordered    amoxicillin -clavulanate (AUGMENTIN ) 875-125 MG tablet  2 times daily        07/12/24 1739             Note:  This document was prepared using Dragon voice recognition software and may include unintentional dictation errors.   Arlander Charleston, MD 07/12/24 [phone]

## 2024-07-12 NOTE — Telephone Encounter (Signed)
 Copied from CRM [id number]. Topic: General - Other >> Jul 12, 2024 12:28 PM Yolanda T wrote: Reason for CRM: patient called back to let [name] know that after the liquid diet she placed him his pain is still the same and the medication only helps a little bit. Please f/u with patient

## 2024-07-30 DIAGNOSIS — M170 Bilateral primary osteoarthritis of knee: Secondary | ICD-10-CM | POA: Diagnosis not present

## 2024-08-03 DIAGNOSIS — D638 Anemia in other chronic diseases classified elsewhere: Secondary | ICD-10-CM | POA: Diagnosis not present

## 2024-08-03 DIAGNOSIS — Z8719 Personal history of other diseases of the digestive system: Secondary | ICD-10-CM | POA: Diagnosis not present

## 2024-08-03 DIAGNOSIS — D508 Other iron deficiency anemias: Secondary | ICD-10-CM | POA: Diagnosis not present

## 2024-08-03 DIAGNOSIS — Z9889 Other specified postprocedural states: Secondary | ICD-10-CM | POA: Diagnosis not present

## 2024-08-24 ENCOUNTER — Telehealth: Payer: Self-pay | Admitting: Nurse Practitioner

## 2024-08-24 NOTE — Telephone Encounter (Unsigned)
 Copied from CRM #[id number]. Topic: General - Call Back - No Documentation >> Aug 24, 2024  9:33 AM Tiffini S wrote: Reason for CRM: Patient states that he had a missed call from [facility]- no voicemail message was left- asked for a call back at [phone]

## 2024-08-25 NOTE — Telephone Encounter (Signed)
 Did you call the patient? I'm not seeing where anyone in this office has but was wanting to make sure

## 2024-09-02 ENCOUNTER — Other Ambulatory Visit: Payer: Self-pay | Admitting: Nurse Practitioner

## 2024-09-02 DIAGNOSIS — I10 Essential (primary) hypertension: Secondary | ICD-10-CM

## 2024-09-03 NOTE — Telephone Encounter (Signed)
 Requested medications are due for refill today.  yes  Requested medications are on the active medications list.  yes  Last refill. 03/10/2024 #180 0 rf  Future visit scheduled.   yes  Notes to clinic.  Abnormal labs    Requested Prescriptions  Pending Prescriptions Disp Refills   hydrALAZINE  (APRESOLINE ) 10 MG tablet [Pharmacy Med Name: HYDRALAZINE  10 MG TABLETS (ORANGE)] 180 tablet 0    Sig: TAKE 1 TABLET BY MOUTH TWICE DAILY AS NEEDED, TAKE IF BLOOD PRESSURE GREATER THAN 140/90     Cardiovascular:  Vasodilators Failed - 09/03/2024 11:19 AM      Failed - HCT in normal range and within 360 days    HCT  Date Value Ref Range Status  07/12/2024 35.1 (L) 39.0 - 52.0 % Final   Hematocrit  Date Value Ref Range Status  10/19/2015 37.6 37.5 - 51.0 % Final         Failed - HGB in normal range and within 360 days    Hemoglobin  Date Value Ref Range Status  07/12/2024 11.4 (L) 13.0 - 17.0 g/dL Final  90/80/7974 87.9 (L) 13.0 - 17.0 g/dL Final  96/97/7982 87.6 (L) 12.6 - 17.7 g/dL Final         Failed - RBC in normal range and within 360 days    RBC  Date Value Ref Range Status  07/12/2024 4.04 (L) 4.22 - 5.81 MIL/uL Final         Failed - ANA Screen, Ifa, Serum in normal range and within 360 days    No results found for: ANA, ANATITER, LABANTI       Failed - Last BP in normal range    BP Readings from Last 1 Encounters:  07/12/24 (!) 182/72         Passed - WBC in normal range and within 360 days    WBC  Date Value Ref Range Status  07/12/2024 4.7 4.0 - 10.5 K/uL Final         Passed - PLT in normal range and within 360 days    Platelets  Date Value Ref Range Status  07/12/2024 306 150 - 400 K/uL Final  10/19/2015 343 150 - 379 x10E3/uL Final   Platelet Count  Date Value Ref Range Status  05/07/2024 371 150 - 400 K/uL Final         Passed - Valid encounter within last 12 months    Recent Outpatient Visits           1 month ago LLQ abdominal pain    [facility] Gareth [name] FALCON, FNP   1 month ago Diverticulitis   [facility] Gareth [name] FALCON, FNP   5 months ago Aortic atherosclerosis   [facility] Gareth [name] FALCON, FNP   8 months ago Generalized abdominal pain   [facility] Gareth [name] FALCON, FNP   9 months ago Neuropathy   [facility] Gareth [name] FALCON, FNP       Future Appointments             In 1 month Francisca, Redell BROCKS, MD [facility] [place]

## 2024-10-27 ENCOUNTER — Ambulatory Visit: Admitting: Urology

## 2024-11-04 ENCOUNTER — Ambulatory Visit: Admitting: Internal Medicine

## 2024-11-04 ENCOUNTER — Other Ambulatory Visit

## 2024-11-04 ENCOUNTER — Ambulatory Visit: Admitting: Urology

## 2024-11-04 ENCOUNTER — Ambulatory Visit

## 2025-07-11 ENCOUNTER — Encounter (INDEPENDENT_AMBULATORY_CARE_PROVIDER_SITE_OTHER)

## 2025-07-11 ENCOUNTER — Ambulatory Visit (INDEPENDENT_AMBULATORY_CARE_PROVIDER_SITE_OTHER): Admitting: Vascular Surgery

## 2025-07-21 ENCOUNTER — Ambulatory Visit
